# Patient Record
Sex: Male | Born: 1940 | Race: Black or African American | Hispanic: No | Marital: Married | Smoking: Former smoker
Health system: Southern US, Community
[De-identification: ages and names within clinical notes are randomized; demographics above are authoritative.]

## PROBLEM LIST (undated history)

## (undated) DIAGNOSIS — K219 Gastro-esophageal reflux disease without esophagitis: Secondary | ICD-10-CM

## (undated) DIAGNOSIS — H409 Unspecified glaucoma: Secondary | ICD-10-CM

## (undated) DIAGNOSIS — E785 Hyperlipidemia, unspecified: Secondary | ICD-10-CM

## (undated) DIAGNOSIS — I739 Peripheral vascular disease, unspecified: Secondary | ICD-10-CM

## (undated) DIAGNOSIS — A0472 Enterocolitis due to Clostridium difficile, not specified as recurrent: Secondary | ICD-10-CM

## (undated) DIAGNOSIS — I639 Cerebral infarction, unspecified: Secondary | ICD-10-CM

## (undated) DIAGNOSIS — I480 Paroxysmal atrial fibrillation: Secondary | ICD-10-CM

## (undated) DIAGNOSIS — I10 Essential (primary) hypertension: Secondary | ICD-10-CM

## (undated) DIAGNOSIS — N183 Chronic kidney disease, stage 3 unspecified: Secondary | ICD-10-CM

## (undated) DIAGNOSIS — G4730 Sleep apnea, unspecified: Secondary | ICD-10-CM

## (undated) DIAGNOSIS — D649 Anemia, unspecified: Secondary | ICD-10-CM

## (undated) DIAGNOSIS — E1122 Type 2 diabetes mellitus with diabetic chronic kidney disease: Secondary | ICD-10-CM

## (undated) DIAGNOSIS — I2510 Atherosclerotic heart disease of native coronary artery without angina pectoris: Secondary | ICD-10-CM

## (undated) DIAGNOSIS — N181 Chronic kidney disease, stage 1: Secondary | ICD-10-CM

## (undated) HISTORY — DX: Paroxysmal atrial fibrillation: I48.0

## (undated) HISTORY — DX: Morbid (severe) obesity due to excess calories: E66.01

## (undated) HISTORY — PX: NO PAST SURGERIES: SHX2092

## (undated) HISTORY — DX: Sleep apnea, unspecified: G47.30

## (undated) HISTORY — PX: THYROID SURGERY: SHX805

## (undated) HISTORY — DX: Gastro-esophageal reflux disease without esophagitis: K21.9

## (undated) HISTORY — PX: CORONARY ARTERY BYPASS GRAFT: SHX141

## (undated) HISTORY — PX: CHOLECYSTECTOMY: SHX55

## (undated) HISTORY — DX: Chronic kidney disease, stage 3 (moderate): N18.3

## (undated) HISTORY — DX: Chronic kidney disease, stage 3 unspecified: N18.30

## (undated) HISTORY — DX: Unspecified glaucoma: H40.9

## (undated) HISTORY — DX: Anemia, unspecified: D64.9

## (undated) HISTORY — DX: Hyperlipidemia, unspecified: E78.5

---

## 2001-02-11 ENCOUNTER — Emergency Department (HOSPITAL_COMMUNITY): Admission: EM | Admit: 2001-02-11 | Discharge: 2001-02-11 | Payer: Self-pay | Admitting: Internal Medicine

## 2001-02-20 ENCOUNTER — Encounter: Payer: Self-pay | Admitting: Internal Medicine

## 2001-02-20 ENCOUNTER — Ambulatory Visit (HOSPITAL_COMMUNITY): Admission: RE | Admit: 2001-02-20 | Discharge: 2001-02-20 | Payer: Self-pay | Admitting: Internal Medicine

## 2001-11-08 ENCOUNTER — Emergency Department (HOSPITAL_COMMUNITY): Admission: EM | Admit: 2001-11-08 | Discharge: 2001-11-08 | Payer: Self-pay | Admitting: *Deleted

## 2001-11-08 ENCOUNTER — Encounter: Payer: Self-pay | Admitting: *Deleted

## 2002-06-28 ENCOUNTER — Ambulatory Visit (HOSPITAL_COMMUNITY): Admission: RE | Admit: 2002-06-28 | Discharge: 2002-06-28 | Payer: Self-pay | Admitting: Internal Medicine

## 2004-01-27 ENCOUNTER — Inpatient Hospital Stay (HOSPITAL_COMMUNITY): Admission: EM | Admit: 2004-01-27 | Discharge: 2004-01-29 | Payer: Self-pay | Admitting: Emergency Medicine

## 2006-07-03 ENCOUNTER — Emergency Department (HOSPITAL_COMMUNITY): Admission: EM | Admit: 2006-07-03 | Discharge: 2006-07-03 | Payer: Self-pay | Admitting: Emergency Medicine

## 2006-12-01 ENCOUNTER — Emergency Department (HOSPITAL_COMMUNITY): Admission: EM | Admit: 2006-12-01 | Discharge: 2006-12-01 | Payer: Self-pay | Admitting: Emergency Medicine

## 2006-12-17 ENCOUNTER — Emergency Department (HOSPITAL_COMMUNITY): Admission: EM | Admit: 2006-12-17 | Discharge: 2006-12-18 | Payer: Self-pay | Admitting: Emergency Medicine

## 2007-01-07 ENCOUNTER — Emergency Department (HOSPITAL_COMMUNITY): Admission: EM | Admit: 2007-01-07 | Discharge: 2007-01-07 | Payer: Self-pay | Admitting: Emergency Medicine

## 2007-02-01 ENCOUNTER — Ambulatory Visit: Payer: Self-pay | Admitting: Cardiology

## 2007-02-01 ENCOUNTER — Inpatient Hospital Stay (HOSPITAL_COMMUNITY): Admission: EM | Admit: 2007-02-01 | Discharge: 2007-02-13 | Payer: Self-pay | Admitting: Emergency Medicine

## 2007-02-06 ENCOUNTER — Encounter (INDEPENDENT_AMBULATORY_CARE_PROVIDER_SITE_OTHER): Payer: Self-pay | Admitting: *Deleted

## 2007-02-07 ENCOUNTER — Ambulatory Visit: Payer: Self-pay | Admitting: Internal Medicine

## 2007-02-16 ENCOUNTER — Inpatient Hospital Stay (HOSPITAL_COMMUNITY): Admission: EM | Admit: 2007-02-16 | Discharge: 2007-02-20 | Payer: Self-pay | Admitting: Emergency Medicine

## 2007-02-17 ENCOUNTER — Ambulatory Visit: Payer: Self-pay | Admitting: Internal Medicine

## 2007-02-24 ENCOUNTER — Emergency Department (HOSPITAL_COMMUNITY): Admission: EM | Admit: 2007-02-24 | Discharge: 2007-02-25 | Payer: Self-pay | Admitting: Emergency Medicine

## 2007-02-25 ENCOUNTER — Inpatient Hospital Stay (HOSPITAL_COMMUNITY): Admission: EM | Admit: 2007-02-25 | Discharge: 2007-02-28 | Payer: Self-pay | Admitting: Emergency Medicine

## 2007-02-27 ENCOUNTER — Ambulatory Visit: Payer: Self-pay | Admitting: Internal Medicine

## 2007-02-27 ENCOUNTER — Encounter: Payer: Self-pay | Admitting: Internal Medicine

## 2008-11-23 ENCOUNTER — Inpatient Hospital Stay (HOSPITAL_COMMUNITY): Admission: AD | Admit: 2008-11-23 | Discharge: 2008-11-25 | Payer: Self-pay | Admitting: Internal Medicine

## 2008-11-23 ENCOUNTER — Encounter: Payer: Self-pay | Admitting: Emergency Medicine

## 2008-11-25 ENCOUNTER — Encounter (INDEPENDENT_AMBULATORY_CARE_PROVIDER_SITE_OTHER): Payer: Self-pay | Admitting: *Deleted

## 2009-05-23 ENCOUNTER — Emergency Department (HOSPITAL_COMMUNITY): Admission: EM | Admit: 2009-05-23 | Discharge: 2009-05-23 | Payer: Self-pay | Admitting: Emergency Medicine

## 2010-04-29 ENCOUNTER — Emergency Department (HOSPITAL_COMMUNITY): Admission: EM | Admit: 2010-04-29 | Discharge: 2010-04-29 | Payer: Self-pay | Admitting: Emergency Medicine

## 2010-06-03 ENCOUNTER — Emergency Department (HOSPITAL_COMMUNITY): Admission: EM | Admit: 2010-06-03 | Discharge: 2010-06-03 | Payer: Self-pay | Admitting: Emergency Medicine

## 2010-11-22 ENCOUNTER — Encounter: Payer: Self-pay | Admitting: Internal Medicine

## 2011-01-15 LAB — CBC
HCT: 44 % (ref 39.0–52.0)
Hemoglobin: 14.3 g/dL (ref 13.0–17.0)
MCH: 29.5 pg (ref 26.0–34.0)
MCHC: 32.5 g/dL (ref 30.0–36.0)
MCV: 90.7 fL (ref 78.0–100.0)
Platelets: 225 10*3/uL (ref 150–400)
RBC: 4.86 MIL/uL (ref 4.22–5.81)
RDW: 14.6 % (ref 11.5–15.5)
WBC: 8.4 10*3/uL (ref 4.0–10.5)

## 2011-01-15 LAB — BASIC METABOLIC PANEL
BUN: 17 mg/dL (ref 6–23)
CO2: 29 mEq/L (ref 19–32)
Calcium: 9.5 mg/dL (ref 8.4–10.5)
Chloride: 102 mEq/L (ref 96–112)
Creatinine, Ser: 1.25 mg/dL (ref 0.4–1.5)
GFR calc Af Amer: >60 mL/min (ref >60)
GFR calc non Af Amer: 58 mL/min — ABNORMAL LOW (ref >60)
Glucose, Bld: 59 mg/dL — ABNORMAL LOW (ref 70–99)
Potassium: 4.2 mEq/L (ref 3.5–5.1)
Sodium: 139 mEq/L (ref 135–145)

## 2011-01-15 LAB — URINALYSIS, ROUTINE W REFLEX MICROSCOPIC
Bilirubin Urine: NEGATIVE
Glucose, UA: NEGATIVE mg/dL
Ketones, ur: NEGATIVE mg/dL
Leukocytes, UA: NEGATIVE
Nitrite: NEGATIVE
Protein, ur: NEGATIVE mg/dL
Specific Gravity, Urine: 1.015 (ref 1.005–1.030)
Urobilinogen, UA: 0.2 mg/dL (ref 0.0–1.0)
pH: 5.5 (ref 5.0–8.0)

## 2011-01-15 LAB — HEPATIC FUNCTION PANEL
ALT: 21 U/L (ref 0–53)
AST: 18 U/L (ref 0–37)
Albumin: 3.9 g/dL (ref 3.5–5.2)
Alkaline Phosphatase: 43 U/L (ref 39–117)
Bilirubin, Direct: 0.1 mg/dL (ref 0.0–0.3)
Indirect Bilirubin: 0.6 mg/dL (ref 0.3–0.9)
Total Bilirubin: 0.7 mg/dL (ref 0.3–1.2)
Total Protein: 8 g/dL (ref 6.0–8.3)

## 2011-01-15 LAB — DIFFERENTIAL
Basophils Absolute: 0 10*3/uL (ref 0.0–0.1)
Basophils Relative: 0 % (ref 0–1)
Eosinophils Absolute: 0.1 10*3/uL (ref 0.0–0.7)
Eosinophils Relative: 2 % (ref 0–5)
Lymphocytes Relative: 25 % (ref 12–46)
Lymphs Abs: 2.1 10*3/uL (ref 0.7–4.0)
Monocytes Absolute: 0.8 10*3/uL (ref 0.1–1.0)
Monocytes Relative: 10 % (ref 3–12)
Neutro Abs: 5.2 10*3/uL (ref 1.7–7.7)
Neutrophils Relative %: 63 % (ref 43–77)

## 2011-01-15 LAB — GLUCOSE, CAPILLARY
Glucose-Capillary: 115 mg/dL — ABNORMAL HIGH (ref 70–99)
Glucose-Capillary: 56 mg/dL — ABNORMAL LOW (ref 70–99)

## 2011-01-15 LAB — LIPASE, BLOOD: Lipase: 31 U/L (ref 11–59)

## 2011-01-15 LAB — URINE MICROSCOPIC-ADD ON: Urine-Other: NONE SEEN

## 2011-01-17 LAB — DIFFERENTIAL
Basophils Absolute: 0 10*3/uL (ref 0.0–0.1)
Basophils Relative: 1 % (ref 0–1)
Eosinophils Absolute: 0.2 10*3/uL (ref 0.0–0.7)
Eosinophils Relative: 2 % (ref 0–5)
Lymphocytes Relative: 29 % (ref 12–46)
Lymphs Abs: 2.2 10*3/uL (ref 0.7–4.0)
Monocytes Absolute: 0.8 10*3/uL (ref 0.1–1.0)
Monocytes Relative: 10 % (ref 3–12)
Neutro Abs: 4.4 10*3/uL (ref 1.7–7.7)
Neutrophils Relative %: 58 % (ref 43–77)

## 2011-01-17 LAB — BASIC METABOLIC PANEL
BUN: 16 mg/dL (ref 6–23)
CO2: 27 mEq/L (ref 19–32)
Calcium: 9 mg/dL (ref 8.4–10.5)
Chloride: 108 mEq/L (ref 96–112)
Creatinine, Ser: 1.15 mg/dL (ref 0.4–1.5)
GFR calc Af Amer: >60 mL/min (ref >60)
GFR calc non Af Amer: >60 mL/min (ref >60)
Glucose, Bld: 71 mg/dL (ref 70–99)
Potassium: 3.9 mEq/L (ref 3.5–5.1)
Sodium: 142 mEq/L (ref 135–145)

## 2011-01-17 LAB — CBC
HCT: 41.5 % (ref 39.0–52.0)
Hemoglobin: 13.5 g/dL (ref 13.0–17.0)
MCH: 29.4 pg (ref 26.0–34.0)
MCHC: 32.5 g/dL (ref 30.0–36.0)
MCV: 90.4 fL (ref 78.0–100.0)
Platelets: 235 10*3/uL (ref 150–400)
RBC: 4.58 MIL/uL (ref 4.22–5.81)
RDW: 14.3 % (ref 11.5–15.5)
WBC: 7.5 10*3/uL (ref 4.0–10.5)

## 2011-02-07 LAB — GLUCOSE, CAPILLARY: Glucose-Capillary: 143 mg/dL — ABNORMAL HIGH (ref 70–99)

## 2011-02-15 LAB — CBC
HCT: 42.4 % (ref 39.0–52.0)
HCT: 41.3 % (ref 39.0–52.0)
Hemoglobin: 13.8 g/dL (ref 13.0–17.0)
Hemoglobin: 13.3 g/dL (ref 13.0–17.0)
MCHC: 32.6 g/dL (ref 30.0–36.0)
MCHC: 32.1 g/dL (ref 30.0–36.0)
MCV: 90.3 fL (ref 78.0–100.0)
MCV: 91.4 fL (ref 78.0–100.0)
Platelets: 230 10*3/uL (ref 150–400)
Platelets: 213 10*3/uL (ref 150–400)
RBC: 4.7 MIL/uL (ref 4.22–5.81)
RBC: 4.52 MIL/uL (ref 4.22–5.81)
RDW: 14.2 % (ref 11.5–15.5)
RDW: 13.8 % (ref 11.5–15.5)
WBC: 8.1 10*3/uL (ref 4.0–10.5)
WBC: 6.5 10*3/uL (ref 4.0–10.5)

## 2011-02-15 LAB — GLUCOSE, CAPILLARY
Glucose-Capillary: 161 mg/dL — ABNORMAL HIGH (ref 70–99)
Glucose-Capillary: 163 mg/dL — ABNORMAL HIGH (ref 70–99)
Glucose-Capillary: 194 mg/dL — ABNORMAL HIGH (ref 70–99)
Glucose-Capillary: 207 mg/dL — ABNORMAL HIGH (ref 70–99)
Glucose-Capillary: 211 mg/dL — ABNORMAL HIGH (ref 70–99)
Glucose-Capillary: 218 mg/dL — ABNORMAL HIGH (ref 70–99)
Glucose-Capillary: 221 mg/dL — ABNORMAL HIGH (ref 70–99)
Glucose-Capillary: 264 mg/dL — ABNORMAL HIGH (ref 70–99)

## 2011-02-15 LAB — COMPREHENSIVE METABOLIC PANEL
ALT: 25 U/L (ref 0–53)
ALT: 24 U/L (ref 0–53)
AST: 27 U/L (ref 0–37)
AST: 21 U/L (ref 0–37)
Albumin: 3.4 g/dL — ABNORMAL LOW (ref 3.5–5.2)
Albumin: 3.1 g/dL — ABNORMAL LOW (ref 3.5–5.2)
Alkaline Phosphatase: 41 U/L (ref 39–117)
Alkaline Phosphatase: 40 U/L (ref 39–117)
BUN: 13 mg/dL (ref 6–23)
BUN: 12 mg/dL (ref 6–23)
CO2: 27 mEq/L (ref 19–32)
CO2: 30 mEq/L (ref 19–32)
Calcium: 9.1 mg/dL (ref 8.4–10.5)
Calcium: 8.7 mg/dL (ref 8.4–10.5)
Chloride: 100 mEq/L (ref 96–112)
Chloride: 104 mEq/L (ref 96–112)
Creatinine, Ser: 1.29 mg/dL (ref 0.4–1.5)
Creatinine, Ser: 1.08 mg/dL (ref 0.4–1.5)
GFR calc Af Amer: >60 mL/min (ref >60)
GFR calc Af Amer: >60 mL/min (ref >60)
GFR calc non Af Amer: 56 mL/min — ABNORMAL LOW (ref >60)
GFR calc non Af Amer: >60 mL/min (ref >60)
Glucose, Bld: 196 mg/dL — ABNORMAL HIGH (ref 70–99)
Glucose, Bld: 242 mg/dL — ABNORMAL HIGH (ref 70–99)
Potassium: 4.1 mEq/L (ref 3.5–5.1)
Potassium: 4 mEq/L (ref 3.5–5.1)
Sodium: 136 mEq/L (ref 135–145)
Sodium: 138 mEq/L (ref 135–145)
Total Bilirubin: 0.5 mg/dL (ref 0.3–1.2)
Total Bilirubin: 0.5 mg/dL (ref 0.3–1.2)
Total Protein: 6.7 g/dL (ref 6.0–8.3)
Total Protein: 6.5 g/dL (ref 6.0–8.3)

## 2011-02-15 LAB — PROTIME-INR
INR: 1 (ref 0.00–1.49)
INR: 1.1 (ref 0.00–1.49)
Prothrombin Time: 13 seconds (ref 11.6–15.2)
Prothrombin Time: 14.2 seconds (ref 11.6–15.2)

## 2011-02-15 LAB — DIFFERENTIAL
Basophils Absolute: 0.1 10*3/uL (ref 0.0–0.1)
Basophils Relative: 1 % (ref 0–1)
Eosinophils Absolute: 0.2 10*3/uL (ref 0.0–0.7)
Eosinophils Relative: 2 % (ref 0–5)
Lymphocytes Relative: 30 % (ref 12–46)
Lymphs Abs: 2.4 10*3/uL (ref 0.7–4.0)
Monocytes Absolute: 0.6 10*3/uL (ref 0.1–1.0)
Monocytes Relative: 8 % (ref 3–12)
Neutro Abs: 4.8 10*3/uL (ref 1.7–7.7)
Neutrophils Relative %: 59 % (ref 43–77)

## 2011-02-15 LAB — URINALYSIS, ROUTINE W REFLEX MICROSCOPIC
Bilirubin Urine: NEGATIVE
Glucose, UA: NEGATIVE mg/dL
Hgb urine dipstick: NEGATIVE
Ketones, ur: NEGATIVE mg/dL
Nitrite: NEGATIVE
Protein, ur: NEGATIVE mg/dL
Specific Gravity, Urine: 1.012 (ref 1.005–1.030)
Urobilinogen, UA: 0.2 mg/dL (ref 0.0–1.0)
pH: 6 (ref 5.0–8.0)

## 2011-02-15 LAB — APTT
aPTT: 26 seconds (ref 24–37)
aPTT: 33 seconds (ref 24–37)

## 2011-02-15 LAB — CARDIAC PANEL(CRET KIN+CKTOT+MB+TROPI)
CK, MB: 3.6 ng/mL (ref 0.3–4.0)
CK, MB: 4.1 ng/mL — ABNORMAL HIGH (ref 0.3–4.0)
CK, MB: 4.1 ng/mL — ABNORMAL HIGH (ref 0.3–4.0)
Relative Index: 0.5 (ref 0.0–2.5)
Relative Index: 0.5 (ref 0.0–2.5)
Relative Index: 0.6 (ref 0.0–2.5)
Total CK: 715 U/L — ABNORMAL HIGH (ref 7–232)
Total CK: 742 U/L — ABNORMAL HIGH (ref 7–232)
Total CK: 784 U/L — ABNORMAL HIGH (ref 7–232)
Troponin I: 0.01 ng/mL (ref 0.00–0.06)
Troponin I: 0.01 ng/mL (ref 0.00–0.06)
Troponin I: 0.01 ng/mL (ref 0.00–0.06)

## 2011-02-15 LAB — HEMOGLOBIN A1C
Hgb A1c MFr Bld: 8.3 % — ABNORMAL HIGH (ref 4.6–6.1)
Mean Plasma Glucose: 192 mg/dL

## 2011-02-15 LAB — HOMOCYSTEINE: Homocysteine: 9.1 umol/L (ref 4.0–15.4)

## 2011-02-15 LAB — TSH: TSH: 0.727 u[IU]/mL (ref 0.350–4.500)

## 2011-03-16 NOTE — Discharge Summary (Signed)
[name], FERRARE NO.:  [id number]   MEDICAL RECORD NO.:  [id number]          PATIENT TYPE:  INP   LOCATION:  3022                         FACILITY:  [facility]   PHYSICIAN:  Neysa Bonito, MD  DATE OF BIRTH:  1941-05-30   DATE OF ADMISSION:  11/23/2008  DATE OF DISCHARGE:  11/25/2008                               DISCHARGE SUMMARY   PRIMARY CARE PHYSICIAN:  [name] is unassigned to [facility].  He  follows up with [name]:  Facial droop and left-sided numbness.   HOSPITAL COURSE:  During the hospital stay, the patient was observed for  neurological deficit.  There was a suspicion of transient ischemic  attack versus CVA.  The workup is all negative as below.  The patient  will be released today with some change in the medication which includes  increasing the dose of aspirin and increasing the dose of losartan the  anti-hypertension medication.  The patient is stable for discharge and  he was encouraged to follow up with his primary [place] physician within 1  week.  His lipid profile was not done during this hospital stay and the  patient is not willing to wait for the test.  This test can be done as  outpatient and the patient is on a maximum dose of Zocor.   HOSPITAL PROCEDURES:  1. CT head without contrast on November 23, 2008, the impression was      old infarcts, mild chronic small vessel white matter ischemic      change, and mild diffuse cerebral and cerebellar atrophy.  No acute      abnormalities.  2. MRI and MRA done on November 25, 2007, the result is showing atrophy      and small vessel disease.  No visible acute infarct or acute      intracranial hemorrhage.  The MRA impression is reading      unremarkable MR angiography of the intracranial circulation.  3. 2-D echo done on November 25, 2008, the impression was showing      overall left ventricular systolic function was normal.  Left      ventricular ejection  fraction was estimated range between 55% to      60%.  This study was inadequate for evaluation of left ventricular      regional wall motion.  Left ventricle wall thickness was moderately      increased.  There was dyssynergic motion of the interventricular      septum/septal bounce consistent with intraventricular conduction      delay or postop CABG change.  Aortic valve thickness was mildly      increased.  No cardioembolic source identified.   LABORATORY TEST:  His homocysteine is 9.1 within normal range.  Cardiac  panel is essentially negative and his hemoglobin A1c is 8.3.  His TSH is  0.72.  Sodium is 138, potassium 4.0, chloride is 104, bicarb is 30,  glucose is 242, BUN is 12, and creatinine 1.08.  CBC showing white blood  count is 6.5, hemoglobin is 13.3,  hematocrit is 41.3, and platelet count  is 213.  Carotid duplex is essentially negative with antegrade vertebral  artery circulation.   DISCHARGE DIAGNOSES:  1. Slurred speech felt secondary to transient ischemic attack.  No      evidence of acute stroke at this time.  2. History of cerebrovascular accident.  3. Coronary artery disease.  4. Hypertension.  5. Type 2 diabetes.   DISCHARGE MEDICATIONS:  1. Lasix 40 mg daily.  2. Zocor 80 mg at bedtime.  3. Aspirin 325 mg daily.  4. Potassium chloride 20 mEq daily.  5. Lantus 95 units subcutaneously at bedtime.  6. Metformin 1000 mg twice a day.  7. Metoprolol 25 mg p.o. b.i.d.  8. Omeprazole 20 mg daily.  9. Sliding scale insulin.  10.Losartan 100 mg p.o. daily.  11.Norvasc 5 mg p.o. daily.   DISCHARGE PLAN:  The patient is discharged today to follow up with his  primary physician.  It is important to be cognizant of the better  control of his hypertension, his diabetes as well as his cholesterol.  He is aware and agreeable to discharge plan.  Prescription was given.      Neysa Bonito, MD  Electronically Signed     EME/MEDQ  D:  11/25/2008  T:   11/26/2008  Job:  [id number]

## 2011-03-16 NOTE — H&P (Signed)
[name], [name] NO.:  [id number]   MEDICAL RECORD NO.:  [id number]          PATIENT TYPE:  INP   LOCATION:  3022                         FACILITY:  [facility]   PHYSICIAN:  Jana Hakim, M.D. DATE OF BIRTH:  11-Mar-1941   DATE OF ADMISSION:  11/23/2008  DATE OF DISCHARGE:                              HISTORY & PHYSICAL   PRIMARY CARE PHYSICIAN:  Unassigned.  Dr. Ivin Booty of [place] in [place]:  Facial droop, left-sided numbness.   HISTORY OF PRESENT ILLNESS:  This is a 70 year old male who presents  with multiple medical problems and 2 previous CVAs  He was taken to the  emergency department at [facility] by his wife, after she  noticed that he had mild facial drooping and had complaints of left-  sided weakness.  The patient states that he has had a headache for  several days, and at 2:00 p.m. on January 23 he states that he had  increased left-sided weakness and his wife noticed the facial droop.  The patient states that he had 2 previous CVAs, one in 2002 and the next  in 2008, which affected his left side and his speech.  The patient  denies having any chest pain, shortness of breath, nausea, vomiting,  diarrhea, loss of consciousness, dizziness or blurry vision.   PAST MEDICAL HISTORY:  1. Type 2 diabetes mellitus.  2. Coronary artery disease.  3. Hypertension.  4. Hyperlipidemia.  5. Cerebrovascular accidents x2.   PAST SURGICAL HISTORY:  1. 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  2. Cholecystectomy.  3. Thyroid cyst removal.   MEDICATIONS AT THIS TIME:  Metformin, simvastatin, metoprolol, Lantus  insulin, NovoLog sliding scale insulin, aspirin, lisinopril and  omeprazole.   ALLERGIES:  No known drug allergies.   SOCIAL HISTORY:  The patient is married.  He is a nonsmoker and  nondrinker.   FAMILY HISTORY:  Noncontributory.   PHYSICAL EXAMINATION FINDINGS:  This is a 70 year old  overweight, well-  developed male in no discomfort or acute distress currently.  VITAL SIGNS: Temperature 98.0, blood pressure 162/81, heart rate 81,  respirations 20, respirations 97-100%.  HEENT: Examination normocephalic, atraumatic.  There is a right-sided  facial droop.  Pupils are equally round and reactive to light.  Extraocular movements are intact.  Funduscopic benign.  Oropharynx is  clear.  There is no tongue deviation.  NECK:  Supple, full range of motion.  No thyromegaly, adenopathy or  jugular venous distention.  CARDIOVASCULAR:  Regular rate and rhythm.  No murmurs, gallops or rubs.  LUNGS: Clear to auscultation bilaterally.  ABDOMEN: Positive bowel sounds; soft, nontender and nondistended.  EXTREMITIES: Without cyanosis, clubbing or edema.  NEUROLOGIC EXAMINATION:  The patient is alert and oriented x3.  He has  mild dysarthria.  He has right-sided facial drooping and mild tongue  deviation to the left.  There is left hemiparesis.  However, the  strength of the left upper extremity is about a 4 out of 5.  There is no  pronator drifting.  The  patient is able to move both of his lower  extremities and has full range of motion.   LABORATORY STUDIES:  White blood cell count 8.1, hemoglobin 13.8,  hematocrit 42.4, MCV 90.3, platelets 230, neutrophils 59%, lymphocytes  30%.  Sodium 136, potassium 4.1, chloride 100, carbon dioxide 27, BUN  13, creatinine 1.29, glucose 196.  Albumin 3.4, AST 27, ALT 25.  Pro  time 13.0, INR 1.0 and PTT is 26.  CT scan of the head reveals old  infarcts, mild chronic small vessel white matter ischemic changes, and  mild diffuse cerebral and cerebellar atrophy; no acute intracranial  hemorrhage seen.   ASSESSMENT:  A 70 year old male being admitted with:  1. Cerebrovascular accident versus transient ischemic attack.  2. Coronary artery disease.  3. Hypertension.  4. Type 2 diabetes mellitus.  5. Previous cerebrovascular accidents.   PLAN:   The patient will be admitted to a telemetry area for monitoring.  Cardiac enzymes will be performed and a cerebrovascular workup will be  started.  An MRI/MRA study has been ordered, along with a carotid  ultrasound study; and a 2-D echo will also be considered.  The patient's  regular medications will be continued and sliding scale insulin coverage  will also be ordered, with glucose checks q.4 h.  The patient will be  placed on DVT and GI prophylaxis, and his regular medications will be  continued.  Further workup will ensue, pending results of his studies  and his clinical course.      Jana Hakim, M.D.  Electronically Signed     HJ/MEDQ  D:  11/24/2008  T:  11/24/2008  Job:  [id number]

## 2011-03-19 NOTE — Consult Note (Signed)
NAMEADDISON, SCHARTNER NO.:  [id number]   MEDICAL RECORD NO.:  [id number]          PATIENT TYPE:  INP   LOCATION:  A316                          FACILITY:  [facility]   PHYSICIAN:  Cristopher Estimable. Lattie Haw, MD, FACCDATE OF BIRTH:  [birth year]   DATE OF CONSULTATION:  DATE OF DISCHARGE:                                 CONSULTATION   REFERRING PHYSICIAN:  Dr. Romona Curls.   PRIMARY CARE:  [place]   PRIMARY CARDIOLOGY:  [place].   HISTORY OF PRESENT ILLNESS:  A 70 year old gentleman admitted with acute  cholecystitis, now referred for preoperative assessment.  Mr. Kenderdine  cardiac history dates back approximately 1 year, when he was admitted  with his first episode of acute cholecystitis.  Cardiac workup was  undertaken for unclear reasons and apparently revealed severe coronary  disease with normal LV systolic function, prompting CABG surgery at the  [facility].  His postoperative course was complicated  by a C. Difficile infection that eventually resolved completely.  He has  done well since, with essentially no symptoms.  He reports vague  momentary episodes of dull chest and epigastric discomfort,  intermittently.  It is unrelated to exertion, is non-radiating and has  no associated symptoms.  He has diabetes that has been generally under  good control, with a single oral agent, plus insulin.  He has lapsed, in  terms of his diet recently, resulting in some CBGs greater than 200.  He  has not used tobacco products for years.  He does not believe that he  has hypertension, but is being treated with anti-hypertensive  medication.  His lipid status is unknown on treatment.   RECENT MEDICATIONS:  Include:  1. Omeprazole 20 mg daily.  2. Clonazepam 0.25 mg p.r.n.  3. Lisinopril 5 mg daily.  4. Metformin 1000 mg b.i.d.  5. Aspirin 325 mg daily.  6. Simvastatin 40 mg daily.  7. Metoprolol 25 mg b.i.d.  8. Citalopram 20 mg daily.  9. Insulin N 15  units q.a.m. and 30 units q.p.m.   The patient reports no drug allergies.   PAST MEDICAL HISTORY:  Is otherwise unremarkable.  Other than his  cardiac surgery, he underwent excision of a cyst from his thyroid.  He  has had no abdominal procedures.  There is a history of depression and  GERD.   SOCIAL HISTORY:  Married; denies the use of tobacco products or alcohol.   REVIEW OF SYSTEMS:  Notable for a CVA in 2000.  He has a problem with  chronic constipation.  He requires corrective lenses for near and far  vision.  All other systems reviewed and are negative.   FAMILY HISTORY:  Not above, mother and father developed coronary disease  at advanced age.   EXAM:  GENERAL:  Pleasant, well-appearing, somewhat overweight gentleman  in no acute distress.  VITAL SIGNS:  The temperature is 98.9, blood pressure 150/75, heart rate  86 and regular, respirations 20, CBG 316.  HEENT:  Anicteric sclerae; pupils equal, round, reactive to light;  normal oral mucosa.  NECK:  No jugular  venous distension; normal carotid upstrokes without  bruits.  ENDOCRINE:  No thyromegaly.  HEMATOPOIETIC:  No adenopathy.  SKIN:  Keloid formation at the surgical sites in the thorax and chest  and leg.  LUNGS:  Clear.  CARDIAC:  Normal first and second heart sounds; minimal systolic murmur.  ABDOMEN:  Soft and nontender; liver edge palpable 2-3 cm below the right  costal margin; normal bowel sounds without bruits; no masses.  EXTREMITIES:  Decreased distal pulses; no edema.  NEUROMUSCULAR:  Symmetric strength and tone; normal cranial nerves.  MUSCULOSKELETAL:  No joint deformities.   EKG and chest x-ray are pending.   LABORATORY:  Notable for normal white count, glucose around 300, normal  renal function and elevated LFTs.   IMPRESSION:  Mr. Gruba is doing well from a cardiac standpoint,  approximately 1 year following CABG surgery.  With normal LV systolic  function preoperatively and postoperatively.  His  cardiovascular risk  for non-cardiac surgery is low.  No additional preoperative testing is  necessary.  His dose of metoprolol will be increased to 50 mg b.i.d.   Diabetic control appears sub-optimal, but may have been worsened due to  stress and other issues with acute hospitalization.  A hemoglobin A1c  level will be checked.  Lipids will be checked.  I will be happy to  follow this nice gentleman with you.      Cristopher Estimable. Lattie Haw, MD, [facility]  Electronically Signed     RMR/MEDQ  D:  02/03/2007  T:  02/03/2007  Job:  [id number]

## 2011-03-19 NOTE — Group Therapy Note (Signed)
NAMEKIANSH, ANDRZEJEWSKI NO.:  [id number]   MEDICAL RECORD NO.:  [id number]          PATIENT TYPE:  INP   LOCATION:  A210                          FACILITY:  [facility]   PHYSICIAN:  Audria Nine, M.D.DATE OF BIRTH:  Jun 10, 1941   DATE OF PROCEDURE:  02/18/2007  DATE OF DISCHARGE:                                 PROGRESS NOTE   SUBJECTIVE:  The patient says his left facial numbness and left arm  weakness is better.  He denies any fevers or chills, no headaches.  Physical therapy thinks that he is doing better and may only require  home PT.  His diarrhea is also better.  The patient has a history of C.  diff colitis.   OBJECTIVE:  GENERAL:  Conscious, alert, comfortable, pleasant, not in  acute distress.  VITAL SIGNS:  Blood pressure 140/61, pulse of 73, respirations 20,  temperature 98, blood sugar are ranging between 186 to 309, oxygen  saturation was 95% on room air.  HEENT:  Normocephalic atraumatic.  Oral mucosa was moist with no  exudate.  NECK:  Supple.  No JVD.  No lymphadenopathy.  LUNGS:  Clear clinically.  Good air entry bilaterally.  HEART:  S1 S2 regular.  No S3, S4, gallops, or rubs.  ABDOMEN:  Soft, nontender.  Bowel sounds positive.  EXTREMITIES:  No edema.  CNS:  Conscious, alert, well oriented in time, place, and person.  He  does have very minimal weakness on the left side with a positive  pronator drift.  Power is 4/5 in both upper and lower extremities with  no decreased sensation.  The patient, however, reports decreased  sensation on the face, on the left side.  No cerebellar signs were  noted.   LABORATORY/DIAGNOSTIC DATA:  White blood cell count was 8.3, hemoglobin  of 12, hem 35.8, platelet count 417 with no left shift.  Sodium was 135,  potassium 4.2, chloride of 100, CO2 was 28, glucose 254, BUN of 11,  creatinine 1.  AST of 14, ALT 16, albumin 2.6.   ASSESSMENT/PLAN:  Mr. Dittmar is a 70 year old African American male who  was  admitted with an acute cerebrovascular accident involving his left  side.  Head CT scan actually confirmed evidence of acute ischemic  stroke.  The patient had been in the hospital for the past two weeks,  was admitted for a gallbladder disease and had a laparoscopic  cholecystectomy.  His aspirin has been on hold since his surgery.  And,  the patient was only restarted on it yesterday.  The patient clearly  does have risk factors for stroke and myocardial infarction based on his  prior history of high cholesterol, diabetes, and hypertension.  The  stroke appears to be very mild.  I discussed with physical therapy and  it appears that the patient will be able to go home on home physical  therapy.  We will observe him over the weekend and anticipate discharge  in the next 2-3 days.  Continue all other medications at this time.     Audria Nine, M.D.  Electronically Signed  AM/MEDQ  D:  02/18/2007  T:  02/18/2007  Job:  [id number]

## 2011-03-19 NOTE — Group Therapy Note (Signed)
NAME:  [name], [name] NO.:  [id number]   MEDICAL RECORD NO.:  [id number]          PATIENT TYPE:  INP   LOCATION:  A326                          FACILITY:  [facility]   PHYSICIAN:  Audria Nine, M.D.DATE OF BIRTH:  1941-02-12   DATE OF PROCEDURE:  02/12/2007  DATE OF DISCHARGE:                                 PROGRESS NOTE   SUBJECTIVE:  The patient is doing better than yesterday.  He has very  minimal pain at his surgical site.  He still continues to have diarrhea.  He had two episodes this morning and three episodes overnight.  He  denies any cramping.  No fevers or chills.  He has C. difficile colitis.  He is on treatment.  The patient does have a history of severe C.  difficile colitis after his bypass surgery in the [place].  He is currently  on metronidazole.   OBJECTIVE:  GENERAL:  Conscious, alert, comfortable, not in acute  distress.  VITAL SIGNS:  Stable at this time.  Blood sugars have been ranging  between 176-420.  HEENT:  Normocephalic and atraumatic.  Oral mucosa was moist, no  exudates.  NECK:  Supple, no JVD.  LUNGS:  Clear.  HEART: S1, S2 regular.  ABDOMEN: Soft, nontender.  Laparotomy scar with staples noted intact.   LABORATORY DATA:  Stable at this time with no evidence of leukocytosis.   ASSESSMENT AND PLAN:  1. Status post cholecystectomy.  The patient is doing fairly well with      no fevers or abdominal pain.  The patient was seen by surgery who      was anticipating discharge from a surgical point of view soon.  2. Clostridium difficile diarrhea.  The patient is currently on      metronidazole.  He is still having some loose bowel movements with      associated lightheadedness.  I think it is reasonable to continue      to watch him closely as he does have a prior history of severe      Clostridium difficile colitis only in 2007.   DISPOSITION:  Anticipate discharge in the next 1-2 days.  Continue  treatment for Clostridium difficile.   Hopefully his diarrheal episodes  will decrease and if approved by surgery, patient may be discharged  home.      Audria Nine, M.D.  Electronically Signed     AM/MEDQ  D:  02/12/2007  T:  02/12/2007  Job:  [id number]

## 2011-03-19 NOTE — Group Therapy Note (Signed)
NAME:  AADHI, SMYSER NO.:  [id number]   MEDICAL RECORD NO.:  [id number]          PATIENT TYPE:  INP   LOCATION:  A326                          FACILITY:  [facility]   PHYSICIAN:  Audria Nine, M.D.DATE OF BIRTH:  07-11-1941   DATE OF PROCEDURE:  02/11/2007  DATE OF DISCHARGE:                                 PROGRESS NOTE   SUBJECTIVE:  Patient is still recuperating from surgery.  He has no  significant pain.  He has developed some diarrhea, has had 3 episodes  today, mostly loose.  He denies any cramping or fevers.  His stool  cultures have come back C. difficile positive.   Patient was started on metronidazole early this morning.   OBJECTIVE:  Conscious, alert, comfortable, not in acute distress.  VITAL SIGNS:  Blood pressure was stable.  HEENT EXAM:  Normocephalic and atraumatic.  Oral mucosa was moist.  NECK:  Supple.  No JVD.  LUNGS:  Were clear.  Clinically, good air entry bilaterally.  HEART:  S1 and S2 regular.  No S3, gallops, or rubs.  ABDOMEN:  Laparotomy scar on the right upper quadrant with staples  intact with no tenderness noted.  JP drain mostly serosanguineous fluid  less than cc was seen.  EXTREMITIES:  No edema.   LABORATORY DATA:  White blood cells count is 8.5, hemoglobin 11.9,  hematocrit 35.8, platelet count was 345,000.  Cultures of liver abscess  are negative.  Stools for C. difficile was positive.   ASSESSMENT AND PLAN:  1. Status post cholecystectomy.  Patient is doing fairly well with no      fevers, no abdominal pain.  His diet is also much better.  We will      continue current treatment as per Surgery.  2. Clostridium difficile diarrhea.  Patient told me he has had a      history of this in the past when he was at [place] after he had a bypass      surgery in 2007.  He said it was fairly severe.  Patient was      started on Flagyl already.  We will continue to monitor closely for      any exacerbation or worsening of the C.  difficile diarrhea.  He is      otherwise stable at this time.  3. Elevated transaminases.  This is stable, likely due to liver      abscess.  Cultures from the liver abscess are negative thus far.  4. Diabetes mellitus.  Blood sugars are still fluctuating.  We will      continue sliding scale and oral hyperglycemic      agents.  Today is day 5 postop.  5. Coronary artery disease.  Stable, with no chest pain.   DISPOSITION:  I anticipate discharge home in the next 1 to 2 days.      Audria Nine, M.D.  Electronically Signed     AM/MEDQ  D:  02/11/2007  T:  02/11/2007  Job:  [id number]

## 2011-03-19 NOTE — Consult Note (Signed)
NAMEMARVIS, [name] NO.:  [id number]   MEDICAL RECORD NO.:  [id number]          PATIENT TYPE:  INP   LOCATION:  A210                          FACILITY:  [facility]   PHYSICIAN:  Kofi A. Merlene Laughter, M.D. DATE OF BIRTH:  08-Dec-1940   DATE OF CONSULTATION:  DATE OF DISCHARGE:                                 CONSULTATION   Patient is a 70 year old black male who underwent cholecystectomy about  a week or so ago for right upper quadrant pain.  He apparently had a  relatively uneventful course, although he does have diabetes.  He was  maintained on a sliding scale.  He did have some problems with  drowsiness and walking for discharge.  It appears that this got worse  for the first few days after discharge.  He subsequently developed left  facial numbness and even more difficulty ambulating which resulted in  the patient coming to the hospital.  He complains of dizziness.  The  patient had been on aspirin preoperatively, and it appears this was held  and restarted a few days ago.  It appears that the patient did have  Clostridium difficile since that hospital admission.   PAST MEDICAL HISTORY:  1. Hypertension.  2. Diabetes.  3. Coronary artery disease.  4. GERD.  5. Depression.  6. Dyslipidemia.   PAST SURGICAL HISTORY:  1. Status post cystectomy from the thyroid gland.  2. Status post four vessel coronary artery bypass grafting.  3. Status post cholecystectomy for cholelithiasis.   CURRENT MEDICATIONS:  Aspirin, Colace, Lovenox, insulin, lisinopril,  Metformin, metoprolol, Flagyl, Protonix, simvastatin, Phenergan.   SOCIAL HISTORY:  The patient has a supportive family.  No history of  alcohol or tobacco use.  No history of illicit drug use.   ALLERGIES:  None known.   PHYSICAL EXAMINATION:  GENERAL:  A pleasant, mildly overweight gentleman  in no acute distress.  VITAL SIGNS:  Temperature 97.2, pulse 73, respirations 20, blood  pressure 124/63.  HEENT:  Head  is normocephalic and atraumatic.  NECK:  Supple.  ABDOMEN:  Soft.  EXTREMITIES:  Left leg is status post venous grafting with some distal  hyperpigmentation noted there.  Trace ankle edema bilaterally.  MENTATION:  Patient is awake and alert.  He converses well.  He is lucid  and coherent.  No dysarthria noted.  No aphasia is noted.  Cranial nerve  evaluation shows extraocular movements are full without nystagmus.  Visual fields are intact.  Facial muscle strength is symmetric.  Tongue  is midline.  Uvula is midline.  Sensation is significantly diminished to  temperature, light touch on the left side of the upper and lower areas.  Shoulder shrug is normal.  Motor examination shows normal tone, bulk,  and strength.  There is no pronator drift.  Coordination shows dysmetria  involving the left side, particularly the left leg.  No tremors are  noted.  Reflexes are symmetric.  Sensation involving the extremities is  normal bilaterally.   Head CT scan shows circular hypodensity involving the middle cerebellar  peduncle on the left side, suggestive of  a lacunar-type infarct in that  location.   LABORATORY EVALUATION:  WBC 8.2, hemoglobin 13, platelet count 426.  Sed  rate 51.  INR 1.2, sodium 134, potassium 4, chloride 97, glucose 150,  BUN 9, creatinine 0.8, CO2 32, calcium 8.6.  Liver enzymes normal.  Urinalysis shows trace blood with WBCs 0-2, RBCs 7-10.   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  Again, this most likely is a small vessel, although we  certainly should make sure that his larger vertebrobasilar system is not  insufficient.   RECOMMENDATIONS:  Brain MRA/MRI.  Continue with aspirin and homocysteine  level.   Thanks for this consultation.      Kofi A. Merlene Laughter, M.D.  Electronically Signed     KAD/MEDQ  D:  02/17/2007  T:  02/17/2007  Job:  [id number]

## 2011-03-19 NOTE — Op Note (Signed)
NAMEZOEL, BASSANI NO.:  [id number]   MEDICAL RECORD NO.:  [id number]          PATIENT TYPE:  INP   LOCATION:  IC06                          FACILITY:  [facility]   PHYSICIAN:  Felicie Morn, M.D. DATE OF BIRTH:  [birth year]   DATE OF PROCEDURE:  02/06/2007  DATE OF DISCHARGE:                               OPERATIVE REPORT   SURGEON:  Felicie Morn, M.D.   PREOPERATIVE DIAGNOSIS:  Acute cholecystitis, cholelithiasis.   POSTOPERATIVE DIAGNOSIS:  Acute cholecystitis, cholelithiasis   OPERATION/PROCEDURE:  Attempted laparoscopic cholecystectomy, conversion  to open cholecystectomy.   GROSS OPERATIVE FINDINGS:  The patient had a very thickened gallbladder  which was intrahepatic, that looked very extremely inflamed and  thickened and possibly as if neoplasm was present.  This was very  attached to the transverse colon mesentery as well.  A great deal of  inflammation at the fundus of the gallbladder.  There was also noted a  liver abscess.   SPECIMENS:  Cultures were taken from the abscess.  The gallbladder and  stones were sent as specimens and there was a portion of the liver that  was attached to the gallbladder as well.  We removed this  intrahepatically.   DESCRIPTION OF PROCEDURE:  The patient was placed in supine position.  After the adequate administration of general anesthesia via endotracheal  intubation, his entire was prepped with Betadine solution, draped in the  usual manner.  Foley catheter was aseptically inserted and the oral  gastric tube as well.  A periumbilical incision was carried out over the  superior aspect of the umbilicus.  The fascia was grasped with a sharp  towel clip and a Veress needle was inserted and confirmed position with  a saline drop test.  The abdomen was insufflated with approximately 3.5  liters of CO2.  We then placed the 11-mm cannula in the umbilicus using  the Visiport technique and under direct vision, 11 mm  cannula was placed  in the epigastrium.  Observing the liver and the gallbladder area, it  was obvious that there was a great deal of adhesions, making  laparoscopic cholecystectomy fraught with undue risk and danger and I  aborted the procedure right away.  No lateral trocar and cannulas were  placed.   We kept a pneumoperitoneum to help [place] enter the abdomen and then a right  subcostal incision was carried out through skin, subcutaneous tissue,  dividing the rectus muscle with a cautery device and entering the  posterior sheath and the peritoneum.  We explored the abdomen with the  above findings.  Dissection the gallbladder was very tedious in order to  avoid damage or injury to the transverse colon.  This was done without  any damage, I should say, and the gallbladder, which was grossly  thickened and granular in  appearance, almost looking neoplastic, was  dissected down towards the cystic duct.  We then ligated this with 2-0  silk and oversewed this with 2-0 silk, removing the gallbladder and  multiple small stones with it.  We irrigated the liver bed and  controlled  bleeding with a cautery device.  We irrigated the area of the  abscess cavity.  We placed a Jackson-Pratt drain in this area and also  Surgicel within the liver bed.  The Jackson-Pratt drain was placed  through a separate stab wound incision.  After checking for hemostasis  and irrigating and then checking, there was absolutely no bile leak or  anything untoward noted.  We dissected very carefully down and if  anything, we had left some distal tissue in order to make sure that we  did not do any damage to the common bile duct.  And as stated, this was  doubly ligated with 2-0 silk and a large silver clip placed over it.  We  then, after irrigating and the abdomen checked for hemostasis, we then  closed the peritoneum with a running 0 Polysorb suture and the fascia  with interrupted figure-of-eights 0 Polysorb sutures.   The skin was  approximated with a stapling device.  Fascia was closed with 0 Polysorb  in the area of the umbilicus where the laparoscopic surgery was  initiated.  We then closed the skin with a stapling device on both  incisions.  The drain was sutured in place with 3-0 nylon.  I used at  least 10 mL 0.5% Sensorcaine along the incision to help with  postoperative discomfort.  A sterile dressing was applied.  Prior to  closure all sponge, needle, and instrument counts were found to be  correct.  Estimated blood loss was approximately 150 mL.  The patient  received 1800 mL of crystalloids intraoperatively.  There were no  complications.  The patient was taken to recovery room in satisfactory  condition with Foley catheter and a nasogastric tube inserted.   We will have the cardiologist continue to follow this patient.  This  patient is to remain on the hospitalist service and I will follow  surgically.      Felicie Morn, M.D.  Electronically Signed     WB/MEDQ  D:  02/06/2007  T:  02/07/2007  Job:  [id number]   cc:   [facility]   [facility] Hospitalist Service

## 2011-03-19 NOTE — H&P (Signed)
NAME:  Charles, [name] NO.:  [id number]   MEDICAL RECORD NO.:  [id number]          PATIENT TYPE:  INP   LOCATION:  A310                          FACILITY:  [facility]   PHYSICIAN:  Audria Nine, M.D.DATE OF BIRTH:  11-Mar-1941   DATE OF ADMISSION:  02/25/2007  DATE OF DISCHARGE:  LH                              HISTORY & PHYSICAL   PRIMARY CARE PHYSICIAN:  [place]:  1. Acute abdominal pain.  2. Gallbladder fossa fluid collection, possible gallbladder leak      versus abscess, status post cholecystectomy within the past 3      weeks.  3. History of acute ischemic cerebrovascular accident about a week      ago.  4. Poorly controlled type 2 diabetes.  5. History of coronary artery disease.  6. History of Clostridium difficile diarrhea with completion of      therapy.   CHIEF COMPLAINT:  Acute abdominal pain with nausea and vomiting.   HISTORY OF PRESENT ILLNESS:  [name] is a 70 year old African-  American male who is well known to [place] from the hospital service.  The  patient has essentially been in the hospital over the past 1 month.  He  had an open cholecystectomy back on February 05, 2006.  This was performed  by Felicie Morn, M.D.  He did have some trouble after the  cholecystectomy including complications of Clostridium difficile  diarrhea.  Also after he was discharged home, he returned about a week  and a half ago with weakness on his left side.  He was confirmed to have  a cerebrovascular accident.  He also indicated that during that  admission that it was determined that he would be a great candidate for  home physical therapy.  The patient was discharged home on February 20, 2007.  He presented to the emergency room twice on April 26, with nausea  and vomiting and also pain on the left side.  Apparently the patient  received some pain medication with anti nausea medication and was  discharged.  When he was sent home  he continued to have more abdominal  pain rated as 10/10, diffuse and radiating to his back.  He subsequently  re-presented to the emergency room where a CT scan was performed which  revealed abnormality with the gallbladder recess.  The patient is now  being admitted for further evaluation and management.  He was also noted  to have elevated liver function tests.  I started him empirically on  Unasyn and Dilaudid for pain control.  We will be requesting GI to see  him.   REVIEW OF SYSTEMS:  He denies any fevers or chills.  His diarrhea has  completely resolved.  His stool this morning was actually well-formed.  He denies any headaches or dizziness.  He is a little stronger on his  left side now.  He has no frequency, urgency, or dysuria.   PAST MEDICAL HISTORY:  1. Hypertension.  2. Poorly controlled type 2 diabetes.  3. History of coronary artery disease status post  coronary artery      bypass graft surgery.  4. History of hypertension.  5. History of dyslipidemia.  6. Status post cholecystectomy February 01, 2007.  7. 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  8. Cyst removed from his thyroid.  9. History of Clostridium difficile diarrhea x2 in the past.   MEDICATIONS:  1. Omeprazole 20 mg p.o. once a day.  2. Clonazepam 0.25 mg as needed.  3. Lisinopril 5 mg p.o. once a day.  4. Metformin 1000 mg p.o. b.i.d.  5. Aspirin 325 mg p.o. once a day.  6. Simvastatin 40 mg at bedtime.  7. Metoprolol 25 mg p.o. b.i.d.  8. Citalopram 20 mg p.o. daily.  9. NovoLog Insulin 15 in a.m. and 30 at night.  10.Regular Insulin 6 units t.i.d.   ALLERGIES:  The patient denies any drug allergies.   SOCIAL HISTORY:  The patient is married and lives with his wife.  He  does not smoke.  He quit smoking several years ago.  He denies any  alcohol use.  He has five children.   FAMILY HISTORY:  Noncontributory.   PHYSICAL EXAMINATION:  GENERAL:  The patient was conscious and  alert,  comfortable, not in acute distress, and was pleasant.  VITAL SIGNS:  On arrival in the emergency room, he described his pain  then as 10/10.  His blood pressure 141/68, pulse 81, respirations 16,  temperature 98.4 degrees Fahrenheit, oxygen saturation was 97% on room  air.  HEENT:  Normocephalic and atraumatic.  Oral mucosa was moist with no  exudates.  NECK:  Supple, no JVD, and no lymphadenopathy.  LUNGS:  Clear clinically with good air entry bilaterally.  Jugular vein  was distended.  ABDOMEN:  Previous open laparotomy scar was well-healed.  The patient  has some diffuse tenderness in his lower abdomen.  He had no rigidity.  Bowel sounds were hypoactive.  EXTREMITIES:  No edema.  No induration or tenderness was noted.  NEUROLOGY:  The patient was conscious and alert, well-oriented to time,  place, and person.  He had very minimal residual weakness on his left  extremities.   LABORATORY/DIAGNOSTIC DATA:  White blood cell count was 11,000,  hemoglobin 12.2, hematocrit 35.7, platelet count 305 with 85%  neutrophils.  Sodium 136, potassium 4.0, chloride 99, CO2 29, glucose  182, BUN 10, creatinine 1.22.  Total bilirubin 2.5, alkaline phosphatase  160, AST 149, ALT 216, total protein 6.7, albumin 3.0, calcium 8.1.   CT scan of the abdomen and pelvis shows a cholecystectomy site  postoperative collection measuring 5.9 x 3.6 x 4.9 with small amount of  air.  There is concern that this was possibly postoperative abscess,  leak, or combination of the two.  There was also evidence of  inflammatory reaction of the adjacent colon with parapancreatic lymph  node enlargement.   Chest x-ray showed no acute cardiopulmonary pattern.   ASSESSMENT:  [name] is a 70 year old African-American male who has  had a fairly rough one month.  The patient was initially admitted to the hospital with acute abdominal pain and signs consistent with acute  cholecystitis.  He had significant  gallbladder sludge.  He underwent  open cholecystectomy by Dr. Romona Curls.  This has been complicated by  Clostridium difficile diarrhea and the patient also survived a  cerebrovascular accident a few days later after he was discharged home.  He returns now on a second visit to the emergency room in the last 24  hours  with increasing abdominal pain, mostly right upper quadrant.  He  also has nausea and vomiting.  CT scan of his abdomen and pelvis  suggests a fluid collection in the gallbladder fossa with air.  This  raises concern for possible postoperative abscess or postoperative  biliary leak.  I believe the risk of intra-abdominal abscess is  significantly elevated.   PLAN:  Admit him to the medical floor.  We will start him on IV Unasyn 3  grams IV q.6 hours.  We will request gastroenterology and GI to see him  for evaluation.  We will resume all of his previous medications for now.  We will put him on Lovenox for DVT prophylaxis.  The patient may need a  CT guided drainage even possibly ERCP with stenting if he does have a  biliary leak.  Overall the patient has multiple medical problems  including his diabetes and a recent stroke raising his overall morbidity  risk.   We will await GI and general surgery input regarding his status.  As  mentioned above, we will be aggressive with control of his diabetes as  it has been quite difficult to control in the past.  I have explained  the above to the patient and he provides full understanding.      Audria Nine, M.D.  Electronically Signed     AM/MEDQ  D:  02/26/2007  T:  02/27/2007  Job:  [id number]

## 2011-03-19 NOTE — Discharge Summary (Signed)
NAMEMAVRIK, FREIMARK NO.:  [id number]   MEDICAL RECORD NO.:  [id number]          PATIENT TYPE:  INP   LOCATION:  A326                          FACILITY:  [facility]   PHYSICIAN:  Anselmo Pickler, DO    DATE OF BIRTH:  1941-07-07   DATE OF ADMISSION:  02/01/2007  DATE OF DISCHARGE:  04/14/2008LH                               DISCHARGE SUMMARY   ADMISSION DIAGNOSES:  1. Right upper quadrant pain.  2. Diabetes.  3. Hypertension.  4. Hyponatremia.   DISCHARGING DIAGNOSES:  1. Cholelithiasis with status post cholecystectomy.  2. Clostridium difficile.  3. Diabetes.  4. Hypertension.   There were several consults.  The patient was seen by GI, surgery, and  [facility].   The patient had an ultrasound done on April 3, which showed thickening  of the gallbladder and sludge as well as a CT of the abdomen and pelvis  that showed acute cholecystitis without biliary dilation and mild  cardiomegaly, and at the pelvis was no acute abnormalities found.   The patient is a 70 year old African-American male who presented to the  emergency room with right lower quadrant pain.  He had had this pain  that started yesterday.  It was gradual in onset but then had become  severe, rating a 10/10.  He denies any radiation.  It stayed in the  right lower quadrant area.  He denies any constipation, fever or  vomiting.  The patient said that he had had a history of cholecystitis  in the past but while he was being evaluated for this, they found that  he had problems with his heart and ended up having subsequent bypass  surgery prior to having his gallbladder evaluated and having surgery on  it.   Upon physical exam, pertinent positives were that the patient did have  right upper quadrant pain with radiation to the lower quadrant.  No  rigidity or guarding.  Abdomen was nondistended.  Bowel sounds were  positive and appreciated.  There was no edema noted in any of the  extremities.   LABORATORY TESTS:  White blood count was 5.17, hemoglobin 12.4,  hematocrit 36.6, and platelets were 344.  Looking at liver enzymes, AST  was 173, ALT 243.  Amylase 71, lipase 32.  Total bilirubin was 1.8.  BUN  was 7, creatinine was 0.84, glucose was 337.  Sodium was 132, potassium  4.3, chloride 96, CO2 29.   After evaluation from surgery, it was decided that the patient should  have a cholecystectomy performed and he was taken to surgery and the  gallbladder was removed.  During his hospital stay his blood pressure  was kept within moderate control.  His blood sugars were monitored via a  sliding scale and was kept within moderate control and his pain level  was monitored and given pain medications to address that.  Toward the  last few days of his visit, the patient developed severe diarrhea.  A  stool culture was performed and found that the patient had C. difficile.  Upon this notice, the patient was started on  Flagyl 500 mg.  He was then  discharged by surgery and by GI and by cardiology at this point in time.  It was felt that the patient could go home and see his primary care  physician at the [facility] to follow up for the diarrhea and the C. difficile  and was given explicit direction that if the diarrhea did persist at  this level more than 48 hours, to return back to the emergency room for  evaluation.  The patient stated understanding.   The patient was then discharged on all home medications at previous  dose, was given Flagyl 500 mg one by mouth three times a day x10 days.  He was discharged to home stable, to return to his physician at the [place]  in [place] within 7-10 days, and told to follow up and given the  indications as to if he had any pain at his surgery site, increase in  temperature or increase in diarrhea, to return to the emergency room  immediately.      Anselmo Pickler, DO  Electronically Signed     CB/MEDQ  D:  02/16/2007  T:  02/17/2007   Job:  [id number]

## 2011-03-19 NOTE — Consult Note (Signed)
NAME:  [name], BEGGS NO.:  [id number]   MEDICAL RECORD NO.:  [id number]          PATIENT TYPE:  INP   LOCATION:  A316                          FACILITY:  [facility]   PHYSICIAN:  R. Garfield Cornea, M.D. DATE OF BIRTH:  1941/04/10   DATE OF CONSULTATION:  DATE OF DISCHARGE:                                 CONSULTATION   REASON FOR CONSULTATION:  Hyperbilirubinemia, cholecystitis,  questionable ERCP.   REQUESTING PHYSICIAN:  Dr. Romona Curls   HISTORY OF PRESENT ILLNESS:  [name] is a 70 year old male with  history of recurrent episodes of biliary colic.  He complains of severe  right upper quadrant pain which began yesterday morning, does radiate  through to his back.  Pain is 10/10 at worst.  The pain waxes and wanes.  He denies any history of nausea, vomiting.  He describes the pain as  cramping and unrelenting.  He has complained of dark urine.  He denies  any fever or chills.  His weight has remained stable.  Denies any clay-  colored stools.  Denies any jaundice or pruritus.  No known history of  liver disease.  His transaminases were around 200 yesterday.  Today, his  alkaline phosphatase is 170, AST 173, ALT 243, total bilirubin was 1.8  which yesterday was 1.4, direct 0.7.  Total protein 6.3 and albumin 2.7.  Abdominal ultrasound from February 02, 2007, shows a 9-mm gallbladder wall,  sludge, small pericholecystic fluid, CBD of 5 mm, an echogenic liver, a  left exophytic renal cyst 2.4 x 1.8 x 1.9 cm.  CT of the abdomen and  pelvis shows acute cholecystitis, mild cardiomegaly, and a 25% T12  compression fracture.   PAST MEDICAL AND SURGICAL HISTORY:  Coronary artery disease status post  CABG July 2007.  At the same time he was diagnosed with cholecystitis.  This was postponed due to his heart surgery.  He has insulin-dependent  diabetes mellitus, hypertension, hyperlipidemia, depression, GERD which  is well controlled.  He had a [facility] with  adenomatous  polyps last year.  He has had a benign thyroid cyst removed.   MEDICATIONS PRIOR TO ADMISSION:  1. Omeprazole 20 mg daily.  2. Clonazepam 0.25 mg p.r.n.  3. Lisinopril 5 mg daily.  4. Metformin 1 g b.i.d.  5. Aspirin 325 mg daily.  6. Zocor 40 mg q.h.s.  7. Metoprolol 25 mg b.i.d.   ALLERGIES:  No known drug allergies.   FAMILY HISTORY:  No know family history of colorectal carcinoma or liver  disease.  Father deceased at age 88 secondary to coronary artery  disease.  Mother deceased in her 25s secondary to coronary artery  disease with history of diabetes mellitus and hypertension.   SOCIAL HISTORY:  He is married.  He has one healthy son.  He is retired  from [employer].  He has a 25 pack-year history of tobacco use.  Denies  alcohol or drug use.   REVIEW OF SYSTEMS:  CONSTITUTIONAL:  Weight has remained stable.  He has  a couple-month history of fatigue.  CARDIOVASCULAR:  Denies  any chest  pain or palpitations.  PULMONARY:  Denies shortness of breath, dyspnea,  cough, hemoptysis.  GI:  See HPI.  Denies any rectal bleeding or melena,  constipation or diarrhea. GU:  See HPI.  Denies any dysuria, hematuria,  increased urinary frequency.   PHYSICAL EXAMINATION:  VITAL SIGNS:  Weight is 106 kg, height 69 inches,  O2 saturation 99% on room air, temperature 97.9, pulse 67, respirations  20, and blood pressure 131/72.  GENERAL:  [name] is a 70 year old obese African-American male who is  alert, oriented, pleasant and cooperative in no acute distress.  He is  accompanied by his wife today.  HEENT:  Sclerae clear, nonicteric.  Conjunctivae pink.  Oropharynx pink  and moist without any lesions.  NECK:  Supple without mass or thyromegaly.  CHEST:  Heart regular rate and rhythm, normal S1 and S2 without any  murmurs, clicks, rubs or gallops.  LUNGS:  Clear to auscultation bilaterally.  ABDOMEN:  Obese with positive bowel sounds x4.  No bruits auscultated.  Abdomen is  soft, nondistended.  He does have tender hepatomegaly.  Liver  is palpable four fingerbreadths below the right costal margin.  He has  epigastric tenderness.  There is no rebound tenderness or guarding.  Unable to palpate splenomegaly.  EXTREMITIES:  Without clubbing or edema bilaterally.   LABORATORY STUDIES:  Wbc's 5.7, hemoglobin 12.4, hematocrit 36.6,  platelets 344.  Calcium 7.9, sodium 132, potassium 4.3, chloride 96, CO2  29, BUN 7, creatinine 0.84, and glucose 337.  Amylase and lipase were  normal.  His urinalysis showed glycosuria, small amount of bilirubin,  trace protein, and an elevated urobilinogen.   IMPRESSION:  [name] is a 70 year old male with chronic  cholecystitis, gallbladder sludge, possible small stones.  There is no  evidence of common bile duct dilatation.  His bilirubin is 1.8.  At this  point, no significant evidence of choledocholithiasis.  Will recheck in  the morning.  If he has a significant bump in his bilirubin and  transaminases will reconsider need for ERCP at that time.   PLAN:  1. N.p.o. past midnight.  2. Repeat LFTs in the a.m.  3. Will be rechecked by Dr. Gala Romney in the morning.  4. Would consider liver biopsy at the time of surgery as well.   We would like to thank Dr. Romona Curls for allowing [place] to participate in  the care of Mr. Wee.      Les Pou, N.P.      Bridgette Habermann, M.D.  Electronically Signed    KC/MEDQ  D:  02/02/2007  T:  02/02/2007  Job:  [id number]

## 2011-03-19 NOTE — Discharge Summary (Signed)
[name], [name] NO.:  [id number]   MEDICAL RECORD NO.:  [id number]          PATIENT TYPE:  INP   LOCATION:  A310                          FACILITY:  [facility]   PHYSICIAN:  Salem Caster, DO    DATE OF BIRTH:  [birth year]   DATE OF ADMISSION:  02/25/2007  DATE OF DISCHARGE:  04/29/2008LH                               DISCHARGE SUMMARY   DISCHARGE DIAGNOSES:  1. Acute abdominal pain.  2. Elevated liver function enzymes.  3. Subhepatic fluid collection.  4. Recent acute ischemic cerebellar accident.  5. Poorly controlled diabetes.  6. Coronary artery disease.   BRIEF HOSPITAL COURSE:  [name] is a 70 year old African-American  male who has been admitted to the Hospice service multiple times over  the past month.  The patient had an open cholecystectomy back on February 06, 2007.  The patient did have some complications after surgery.  He had  some C. Difficile diarrhea.  The patient was discharged after his  cholecystectomy, returned a week later and had an acute ischemic CVA on  the left side.  The patient has had some weakness since this event.  The  patient returned prior to getting any physical therapy at home.  The  patient was seen by Neurology, again discharged.   The patient then presented back to the emergency room and was admitted  on February 26, 2007, complaining of some lower abdominal pain.  The  patient was seen in the emergency room.  The patient had a CT of the  abdomen performed which showed fluid collection possibly representing  postoperative abscess/leak.  Surgery was re-consulted as well as  Gastroenterology.   The patient was sent to [facility] for possible percutaneous drainage of  this fluid.  The patient was seen at [facility], but returned to [facility] secondary to improving symptoms.  The patient's symptoms have  improved and this fluid collection has also greatly improved over the  last 2 days.  The patient, at this point, is  stable enough for  discharge.   DISCHARGE MEDICATIONS:  1. Aspirin 81 mg daily.  2. Celexa 20 mg daily.  3. Lopressor 25 mg b.i.d.  4. Lisinopril 5 mg daily.  5. Clonazepam 0.25 mg p.r.n.  6. Omeprazole 20 mg daily.  7. NovoLog 8 units in the a.m., 15 units in the p.m.  8. Regular insulin 6 units t.i.d.  9. Augmentin 875 mg p.o. b.i.d. x7 days.  Will defer to patient's primary medical physician regarding the restart  of his Metformin at this time.   CONDITION ON DISCHARGE:  Stable.   DISPOSITION:  He will be discharged to home.  Wife has consulted the [facility]  in [place] regarding the patient's being sent for inpatient physical  therapy.  This would be evaluated to see if patient needs to be  transferred from this facility or patient will be seen as an outpatient  at the [facility] in [place].  The patient does need physical therapy to  recover from his ischemic event.   DISCHARGE FOLLOWUP:  The patient is to  follow up with his primary care  physician in the next 7-10 days.  The patient is also to follow up with  Dr. Melony Overly in the next 2 weeks for monitoring of his elevated liver  enzymes.      Salem Caster, DO  Electronically Signed     SM/MEDQ  D:  02/28/2007  T:  02/28/2007  Job:  [id number]   cc:   Primary Care Physician   Dr. Melony Overly

## 2011-03-19 NOTE — H&P (Signed)
[name], [name] NO.:  [id number]   MEDICAL RECORD NO.:  [id number]                   PATIENT TYPE:  EMS   LOCATION:  ED                                   FACILITY:  [facility]   PHYSICIAN:  Karlyn Agee, M.D.              DATE OF BIRTH:  1941/02/10   DATE OF ADMISSION:  01/27/2004  DATE OF DISCHARGE:                                HISTORY & PHYSICAL   PRIMARY CARE PHYSICIAN:  Tesfaye D. Legrand Rams, M.D.   CHIEF COMPLAINT:  Nausea, vomiting, diarrhea and syncope this morning.   HISTORY OF PRESENT ILLNESS:  This is a 70 year old obese, African-American  man with a history of diabetes who was in fairly good health at work this  morning.  He had started having nausea and vomiting with diarrhea, 2  episodes of vomiting and diarrhea this morning, but felt well enough to go  to work.  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  He apparently had a syncopal episode lasting less than a  minute.  He was brought to the emergency room.   The patient denies any chest pain, shortness of breath, or diaphoresis.  The  patient does not smoke.  His lipid status is unknown, but he does have a  strong family history of hypertension, diabetes, and heart disease.   PAST MEDICAL HISTORY:  1. Insulin-dependent diabetes mellitus.  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   MEDICATIONS:  1. Insulin 70/30, 25 units in the morning and 20 units in the evening.  2. Glucophage 1000 mg twice daily.  3. Eye drops for his right eye, does not know the name or dose.   ALLERGIES:  Aspirin causes him to have an upset stomach.   SOCIAL HISTORY:  Discontinued the use of alcohol and tobacco more  than 20  years ago; was never a very heavy user.  Denies illicit drug use.  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 He has been married  for 9 years.  He has 1 child and 4 step-children.  His son has gallstones,  gallbladder disease.  He has 5 siblings 3 of whom have diabetes.  His mother  died at age 43 from congestive heart failure.  She had diabetes and  hypertension.  His father died from an MI at age 63.   REVIEW OF SYSTEMS:  Positive for glaucoma.  No history of cataracts.  Has  been having post nasal drip for the past 1-2 months, no headache, syncope or  seizures, apart  from as described in the HPI.  No orthopnea, PND, or lower  extremity edema.  Exercise tolerance is good.  No fever, cough, or cold.  No  weight loss.  Nausea, vomiting and diarrhea as in the HPI.  No jaundice.  No  dysuria, frequency, or hematuria, but has been having, recently, polyuria  and polydipsia and very thirsty, which he presumed to be his diabetes out of  control.  No arthritis or musculoskeletal problems.   PHYSICAL EXAMINATION:  GENERAL:  This is an obese and very pleasant, African-  American gentleman lying on the stretcher in no apparent distress.  No chest  pain at this time.  VITAL SIGNS:  His temperature is 99.6, his pulse is 80, his blood pressure  is 137/70.  His respirations are 20 and he is having no pain.   LAB RESULTS:  His white count is 5.9, hematocrit 47, MCV 88, RDW 10.9 and  platelet count 236.  He has 83% neutrophils.  His absolute granulocyte count  is normal at 4.9.  His chemistries:  Sodium 133, potassium 4.1, chloride  104, CO2 23, glucose 285, BUN 13, creatinine 0.9, calcium 8.3.  His total  protein 6, albumin is 3.1.  AST and ALT 19 and 28, alkaline phosphatase 39.  His total bilirubin is 0.4.  His total CK is 535; his CK/MB is 1.7; and his  troponin less than 0.01.  His EKG shows normal sinus rhythm; it is reading  as an anterior fascicular block.  There  is questionable left atrial  enlargement, not striking.  There are no T wave changes suggestive of  ischemia.   ASSESSMENT:  Acute syncope in an obese man with diabetes which is not well  controlled at this time. Although the enzymes are not immediately suggestive  of a cardiac cause, it would be worthwhile to admit him to rule out an acute  cardiac cause for this syncope. If he has 3 sets of cardiac enzymes we would  feel satisfied, and also if his EKG remains nonischemic for this.  We will  admit him, we will hydrate him.  We will put him on a sliding scale to  control his insulin.  We will put him on a graduated diet for his  gastroenteritis.     ___________________________________________                                         Karlyn Agee, M.D.   LC/MEDQ  D:  01/27/2004  T:  01/27/2004  Job:  [id number]

## 2011-03-19 NOTE — Group Therapy Note (Signed)
NAMEOSIAS, FISCH NO.:  [id number]   MEDICAL RECORD NO.:  [id number]          PATIENT TYPE:  INP   LOCATION:  A210                          FACILITY:  [facility]   PHYSICIAN:  Audria Nine, M.D.DATE OF BIRTH:  [birth year]   DATE OF PROCEDURE:  02/20/2007  DATE OF DISCHARGE:                                 PROGRESS NOTE   SUBJECTIVE:  The patient feels a little better today.  His left facial  numbness and left leg weakness is better.  He denies any fevers or  chills.  He has no headaches.  He has no difficulty swallowing.  His  ambulation is also improving.  Physical therapy thinks that the patient  may go home for home PT.  The patient's diarrhea is also significantly  improved, and has had only one episode of semisolid stools in the past  12 hours.   PHYSICAL EXAMINATION:  VITAL SIGNS:  His blood pressure was 132/71,  pulse 77, respirations 18, temperature 98.2.  HEENT:  Normocephalic and atraumatic.  Oral mucosa was moist, with no  exudates.  NECK:  Supple, no JVD.  LUNGS:  Clear.  Good air entry bilaterally.  HEART:  S1 and S2 regular.  No S3, S4, gallops or rubs.  ABDOMEN:  Soft and nontender.  Bowel sounds positive.  No masses  palpable.  EXTREMITIES:  No edema.  CNS EXAMINATION:  The patient continues to have mild left hemiparesis;  power is about 4/5 in both upper and lower extremities, with continued  decreased sensation on the left side of the face.   LABORATORY/DIAGNOSTIC DATA:  White blood cell count 7.6, hemoglobin  12.4, hematocrit 36.7, platelet count 389 with no left shift.  Sodium  137, potassium 4.1, chloride 100, CO2 30, glucose 220, BUN 10,  creatinine 1.01, calcium 8.7.   ASSESSMENT AND PLAN:  68. A 70 year old African-American male who was admitted with an acute      cerebrovascular accident involving his left side.  Head CT      confirmed evidence of acute ischemic stroke.  The patient is doing      much better.  As per physical  therapy, he is not ready to be      discharged yet but would benefit from home physical therapy.  I      also discussed the possible needs at home with the wife, and she      said she might need home health aide -- especially to bathe him in      the morning.  I requested home services to see him.   His blood sugars are slight improved.  Will continue on current therapy.  The patient's wife assured me that his blood sugars are a lot better  usually at home.   1. CLOSTRIDIUM DIFFICILE.  Diarrhea is improved.  He denies any      cramping.  No significant leukocytosis.  He is currently on Flagyl      500 mg p.o. t.i.d.  I think this may be discontinued in the next 3-      4 days.  Disposition overall is improving.  Await physical therapy final  recommendations regarding discharge.  Continue aggressive blood sugar  control.      Audria Nine, M.D.  Electronically Signed     AM/MEDQ  D:  02/20/2007  T:  02/20/2007  Job:  [id number]

## 2011-03-19 NOTE — Consult Note (Signed)
NAME:  [name], HRABE NO.:  [name]   MEDICAL RECORD NO.:  [id number]          PATIENT TYPE:  INP   LOCATION:  A310                          FACILITY:  [facility]   PHYSICIAN:  R. Garfield Cornea, M.D. DATE OF BIRTH:  09-Oct-1941   DATE OF CONSULTATION:  02/26/2007  DATE OF DISCHARGE:                                 CONSULTATION   REASON FOR CONSULTATION:  Abdominal pain, abnormal CT.   HISTORY OF PRESENT ILLNESS:  Mr. Oleg Goggans. Giacomo is a pleasant, 70-year-  old, African-American male with numerous medical problems who is status  post open cholecystectomy back on April 7. He presented approximately 10  days ago with mental status changes and was found to have a left  cerebellar infarct and was seen by Dr. Merlene Laughter. He has had fairly good  return of his neurologic function although he has some difficulties with  his movement on the left side. Physical therapy is being set up through  the [facility] system. Otherwise, directly  related to cholecystectomy he did well until the past 2 days when he  started having boring right upper quadrant abdominal pain. He has not  had any associated nausea or vomiting, he has not had any melena,  hematochezia or hematemesis. At times he has rated the pain 10/10. It  has improved with IV narcotic therapy since hospitalized. His white  count yesterday is 11,000, today it is 8200, H&H 12.3 and 37.0, MCV  90.3, platelet count 262,000. His LFTs are notable for total bilirubin  of 2.3, it was 2.5 yesterday, it is not fractionated. Alkaline  phosphatase 149 and was 160 yesterday. AST and ALT 93 and 180 today, was  149 and 216 yesterday. Albumin is 2.7.  CT of the abdomen with IV and  oral contrast done yesterday demonstrated fluid collection in the  gallbladder fossa with air present. I have reviewed these films  personally with Dr. Vernard Gambles today and there is concern for either a  bowel leak or an abscess. The  fluid collection is approximately 5 x 6 x  4 cm.   This gentleman is on IV Unasyn receiving Dilaudid for analgesia and  p.r.n. Zofran. He is also on Lovenox 30 mg subcu q.24 hours.   It is notable this gentleman was found to have a very thickened  gallbladder wall back on April 3rd with sludge and pericholecystic fluid  suggestive of cholecystitis. He also had an echogenic liver consistent  with mild fatty infiltration.   PAST MEDICAL HISTORY:  1. Hypertension.  2. Diabetes.  3. Coronary artery disease.  4. GERD.  5. Depression.  6. Dyslipidemia.  7. Recent cerebellar stroke.   PAST SURGICAL HISTORY:  1. Cystectomy (thyroid gland).  2. Status post four-vessel coronary artery bypass grafting status post      open cholecystectomy 3 weeks ago.  3. He did have an echocardiogram earlier this month on April 18 which      revealed moderate severe left ventricular hypertrophy with left      ventricular EF 50-55%, some calcifications of the aortic valve.  He saw Dr. Merlene Laughter in consultation on April 18. He felt the patient had  a lacunar infarct in the left cerebellar peduncle.   MEDICATIONS ON ADMISSION:  1. Omeprazole 20 mg orally daily.  2. Clonazepam.  3. Lisinopril.  4. Metformin.  5. Aspirin 325 mg daily.  6. Simvastatin.  7. Metoprolol.  8. Citalopram.   ALLERGIES:  No known drug allergies.   FAMILY HISTORY:  No chronic GI or liver disease.   SOCIAL HISTORY:  The patient has been married for 14 years, has 5  children. He has worked for [employer] previously. No  tobacco, no alcohol.   REVIEW OF SYSTEMS:  No recent chest pain or dyspnea on exertion. Has not  had any fever or chills recently. No odynophagia, dysphagia, early  satiety, nausea or vomiting.   PHYSICAL EXAMINATION:  GENERAL:  Reveals a pleasant, elderly-appearing  gentleman in no acute distress accompanied by his wife.  VITAL SIGNS:  Temperature 97.8, pulse 80, respiratory rate 20, blood   pressure 114/84. Admission weight in pounds 225.5.  SKIN:  Warm and dry.  HEENT:  No scleral icterus. Conjunctiva pink. Oral cavity no lesions.  LUNGS:  Clear to auscultation.  CHEST:  He has a median sternotomy site with keloid formation.  CARDIAC:  Regular rate and rhythm without murmur, gallop or rub.  ABDOMEN:  Obese. He has a well healing right upper quadrant surgical  scar from cholecystectomy. Positive bowel sounds, soft. He is  significantly tender over the area of the right upper quadrant incision  in the right upper quadrant. He does not have an obvious mass or  organomegaly. There is no rebound tenderness.  EXTREMITIES:  He has trace lower extremity edema.   ADDITIONAL LABORATORY DATA:  Sodium 141, potassium 4.6, chloride 102, CO  34. Glucose 130, BUN 9, creatinine 1.23. Urinalysis trace ketones.   ADMITTING IMPRESSION:  Mr. Heman Salah. Viger is a pleasant, 70 year old  gentleman who is about 3 weeks out from an open cholecystectomy. His  postop course has been complicated with a left cerebellar infarct  approximately 10 days ago, now admitted with new onset right upper  quadrant abdominal pain. CT suggests a fluid collection of the  gallbladder fossa. There is air in the gallbladder fossa as well. The  differential at this point in time includes postoperative abscess and/or  postoperative biliary leak. The presence of air in this fluid 3 weeks  out from surgery is more suggestive of a possible abscess. A mild  nonspecific elevation in bilirubin and are noted, the significance of  which is unknown.  If he, indeed, does have an abscess, he bump in her  numbers may be secondary to a bystander phenomen.   RECOMMENDATIONS:  Will pursue a HIDA scan stat. If HIDA scan confirms a  leak will make plans for ERCP with stenting however, I am concerned  about the recent CVA. His risk of subsequent general anesthesia would be increased in the acute post stroke setting. An alternative to  this  approach would be to contemplate percutaneous drainage to create a  controlled fistula/fluid drainage.   If the HIDA does not demonstrate a leak, I suspect this nice gentleman  will still need percutaneous drainage as he likely has an abscess. Will  make further recommendations in the very near future.  Repeat hepatic profile in the morning.   I would like to thank the hospitalist team for allowing me to see this  nice gentleman today in consultation.  Bridgette Habermann, M.D.  Electronically Signed     RMR/MEDQ  D:  02/26/2007  T:  02/26/2007  Job:  [id number]   cc:   Felicie Morn, M.D.  Fax: [phone]

## 2011-03-19 NOTE — Procedures (Signed)
NAMEKENTYN, MAGNANI NO.:  [id number]   MEDICAL RECORD NO.:  [id number]          PATIENT TYPE:  INP   LOCATION:  A210                          FACILITY:  [facility]   PHYSICIAN:  Fay Records, MD, FACCDATE OF BIRTH:  09/04/1941   DATE OF PROCEDURE:  02/17/2007  DATE OF DISCHARGE:                                ECHOCARDIOGRAM   REFERRING PHYSICIAN:  [facility] service, Dr. Sherryle Lis.   INDICATIONS:  The patient is a 70 year old with history of CVA and  coronary disease.   FINDINGS:  Two-dimensional echocardiogram with echocardiogram Doppler:  Left ventricle is normal in size with an end-diastolic dimension of 41  mm.  The interventricular septum is severely thickened at 18 mm.  Posterior wall moderately thickened at 15 mm.   Left atrium is mildly enlarged at 45 mm.  Right atrium and right  ventricle are normal.  Aortic root is normal at 34 mm.   The aortic valve is thickened with calcification mainly at the left  coronary cusp.  In the parasternal long axis view, there is  echodensity/echocardiogram bright area noted that probably represents  some calcification of valve.  Cannot completely exclude something  distinct from the leaflet.  No other masses seen.  No aortic  insufficiency.  Consider TE to further evaluate.   Mitral valve is mildly thickened with no insufficiency.  Pulmonic valve  is normal with no insufficiency.  Tricuspid valve is normal with trace  insufficiency.   Overall, LV systolic function is low normal to mildly decreased at 50-  55%.  RVEF is grossly normal.   No pericardial effusion is seen.   IMPRESSION:  1. 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  2. Left ventricular ejection fraction 50-55%.  3. Aortic valve is thickened with calcification.  Cannot completely      exclude a distinct echodensity on the aortic valve.  May indeed      just represent calcification of the valve leaflets themselves.      Consider further evaluation  with transesophageal echocardiogram.      Clinical correlation is indicated.     Fay Records, MD, [facility]  Electronically Signed    PVR/MEDQ  D:  02/17/2007  T:  02/17/2007  Job:  [id number]

## 2011-03-19 NOTE — H&P (Signed)
NAMEJADRIAN, MURPHY NO.:  [id number]   MEDICAL RECORD NO.:  [id number]          PATIENT TYPE:  INP   LOCATION:  A210                          FACILITY:  [facility]   PHYSICIAN:  Anselmo Pickler, DO    DATE OF BIRTH:  [birth year]   DATE OF ADMISSION:  02/16/2007  DATE OF DISCHARGE:  LH                              HISTORY & PHYSICAL   The patient is a 70 year old African-American male who was just  discharged from [facility] on April 14, but returned to the  hospital for complaints of left facial numbness.  The wife states that  since discharge from the hospital the patient had become increasingly  lethargic and had stopped walking.  Stated, too that he had been  sleeping all day and then complained of lip numbness, which has been  increased to facial numbness and then with a mild headache.  Wife stated  that at that point in time that the patient could not walk at all and  then she decided to bring him back to the hospital.   PAST MEDICAL HISTORY:  The patient is currently married, has history of  coronary artery disease, hypertension, diabetes and hyperlipidemia, is  status post cholecystectomy done February 11, 2007.  Also, the patient has  a history of CVAs in the past.   PAST SURGICAL HISTORY:  Cholecystectomy.  Open heart surgery.  Cyst removed from his thyroid.   SOCIAL HISTORY:  He is a non-smoker, non-drinker.   ALLERGIES:  No known drug allergies reported currently.   MEDICATIONS:  The patient was sent home on:  1. Flagyl 500 mg 1 p.o. t.i.d.  2. Omeprazole 20 mg.  3. Clonazepam 0.25 mg as needed.  4. Lisinopril 5 mg daily.  5. Metformin 1,000 mg b.i.d.  6. Aspirin 325 daily.  7. Simvastatin 40 mg q. h.s.  8. Metoprolol tartrate 25 mg q. 12.   REVIEW OF SYSTEMS:  The patient was negative for weight loss or  dizziness.  Positive blurred vision for eyes.  Negative for ear pain or  tinnitus, sore throat or nasal stuffiness.  CARDIOVASCULAR:   The patient  denies any chest pain, shortness of breath or dyspnea or diaphoresis.  RESPIRATORY:  Negative for cough, wheezing or sputum production.  GASTROINTESTINAL:  Negative for nausea, vomiting, diarrhea.  Positive  for appropriate tenderness after lap cholecystectomy.  GU: Negative for  dysuria, flank pain, hesitancy or frequency.  MUSCULOSKELETAL:  Negative  for arthralgias or neck pain.  SKIN:  Negative for rash, abrasions.  NEUROLOGIC:  Positive for headache and positive for right-sided  weakness.   PHYSICAL EXAMINATION:  VITAL SIGNS:  On presentation:  Blood pressure  139/65, heart rate 75, respirations 20, later on in the visit 134/58,  72, he has been saturating at 99%.  GENERAL:  This patient is a 70 year old African-American male who is  well developed, well nourished in no acute distress.  Answers questions  appropriately.  HEAD:  Normocephalic, atraumatic.  Eyes:  PERRLA. EOMI.  No drainage.  Gross acuity is intact.  Turbinates are moist.  Mouth:  No erythema or  exudate or postnasal drip.  NECK:  Soft, nontender, full range of motion, no masses noted.  HEART:  Regular rate and rhythm.  S1, S2 present.  LUNGS:  Clear to auscultation bilaterally.  No wheezes, crackles or  rales noted.  ABDOMEN:  Round, soft, appropriate tenderness at lap cholecystectomy  scars.  Bowel sounds heard in all 4 quadrants.  MUSCULOSKELETAL:  Mild right-sided weakness in lower extremity.  Equal  strength in upper extremities.  Full range of motion in all joints.  No  swelling or tenderness.  LYMPHATICS:  Cervical, axillary and inguinal lymphatics there is no  lymphadenopathy.  NEUROLOGIC:  Cranial nerves II-X are grossly intact.  Reflexes are 5/5,  strength is 4/5.   LABS:  Done on admission.  Urine was done, that was found to be  negative.  Also cardiac markers:  Troponins 0.5, CK MB was 1.  BMP was  done:  Sodium 136, potassium 4.3, chloride 97, carbon dioxide 33,  glucose 233, BUN 11,  creatinine 0.97.  White count per CBC 7.1, 4.12,  hemoglobin 12.2, hematocrit 32.2, platelets 445.  Tests that were done in the ER:  The patient had an EKG done, normal  sinus rhythm with left axis deviation and a CT was done of the head, per  radiologist possible acute ischemic stroke was noted.   ASSESSMENT/PLAN:  The patient was admitted to [facility] with a  diagnosis of acute ischemic cerebrovascular accident.  Started on IV  therapy with a consult to Dr. Merlene Laughter for evaluation, also to have  [facility] [facility] team to interpret a carotid ultrasound and 2D echocardiogram for  left-sided facial numbness.  The patient was then put on all home  medications as well as a sliding scale for his diabetes.  Will continue  to monitor the patient, await further recommendations from cardiology  and neurology.      Anselmo Pickler, DO  Electronically Signed     CB/MEDQ  D:  02/17/2007  T:  02/17/2007  Job:  [id number]

## 2011-03-19 NOTE — Group Therapy Note (Signed)
NAMEMORTEZ, MEADOWCROFT NO.:  [id number]   MEDICAL RECORD NO.:  [id number]          PATIENT TYPE:  INP   LOCATION:  A316                          FACILITY:  [facility]   PHYSICIAN:  Audria Nine, M.D.DATE OF BIRTH:  04-29-1941   DATE OF PROCEDURE:  02/05/2007  DATE OF DISCHARGE:                                 PROGRESS NOTE   SUBJECTIVE:  The patient continues to do well.  He says his right upper  quadrant pain is almost gone completely.  He is awaiting surgery  tomorrow.  The patient was seen by Dr. Romona Curls who was keeping him  n.p.o. from midnight.   OBJECTIVE:  GENERAL:  Conscious, alert, comfortable, not in acute  distress.  VITAL SIGNS:  Blood pressure 121/67, pulse 71, respirations 20,  temperature 98.9 degrees Fahrenheit.  Blood sugars are ranging between  171 and 299.  HEENT:  Normocephalic and atraumatic.  Oral mucosae moist.  No exudates.  NECK:  Supple.  No JVD or lymphadenopathy.  LUNGS:  Clear.  HEART:  S1, S2 regular.  ABDOMEN:  Obese but soft with mild tenderness in the right upper  quadrant but improved from yesterday.  EXTREMITIES:  No edema.   LABORATORY DATA:  White blood cell count 6.9, hemoglobin 12.2,  hematocrit 36.4, platelet count 359.  Sodium 137, potassium 4.4,  chloride 107, CO2 26, glucose 225. BUN 8, creatinine 0.85.  Alkaline  phosphatase 159, AST 40, ALT 118, albumin 2.6, calcium 8.4.  These  numbers are significantly improved from the admission numbers.   ASSESSMENT AND PLAN:  Acute cholecystitis.  He is awaiting surgery by  Dr. Romona Curls tomorrow morning.  The patient is currently hemodynamically  stable, and we do have a cardiac clearance at this time.  He is  currently on ceftriaxone.   I discussed extensively with his wife the expectations of his surgery  and that we will continue medical followup after his surgery.      Audria Nine, M.D.  Electronically Signed     AM/MEDQ  D:  02/05/2007  T:  02/05/2007   Job:  [id number]

## 2011-03-19 NOTE — Consult Note (Signed)
NAME:  [name], [name] NO.:  [id number]   MEDICAL RECORD NO.:  [id number]          PATIENT TYPE:  INP   LOCATION:  A310                          FACILITY:  [facility]   PHYSICIAN:  Felicie Morn, M.D. DATE OF BIRTH:  [birth year]   DATE OF CONSULTATION:  02/26/2007  DATE OF DISCHARGE:                                 CONSULTATION   Surgery was asked to see this 70 year old black male known to me in the  past.  In essence, he was admitted to the medical service with acute  cholecystitis secondary to cholelithiasis approximately 2 weeks ago at  which time he underwent an open cholecystectomy for a severely inflamed  gallbladder.  This patient was treated with an open cholecystectomy, and  he had drainage of his gallbladder fossa for at least a week with a  Jackson-Pratt drain.  At the time that he was discharged, his liver  function studies were normal, and he had minimal serous drainage from  his Jackson-Pratt drain. This was removed, and he remained afebrile, and  since that time has done well to follow up in the office.   However, he was readmitted to the emergency room this time where he  underwent evaluation for a left mini stroke when he complained of  headache, left-sided facial numbness, and left leg weakness.  He was  seen on the medical service for this. He was later discharged, and then  was admitted last night for some lower abdominal pain accompanied by  nausea and vomiting.  He did have a CT scan which showed a fluid  collection, approximately 5 x 4 cm diameter of fluid and some air in  this collection.  Interestingly, he had no complaints of right upper  quadrant pain at all at any time. His pain has been below the umbilicus  in the hypogastrium area, and in this was accompanied by nausea and  vomiting.  This began Friday.  He at no time, however, had any right  upper quadrant pain.  He did experience a bump in his liver enzymes with  a white count on  admission of 11 with 85% neutrophils when he was  admitted. He had a bump of his liver function studies. Alkaline  phosphatase went 149, SGOT 93, SGPT 180, and bilirubin at 2.3.   He did have a sonogram, as mentioned, with the results above, and a  hepatobiliary scan which showed no leak or obstruction.  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  He has also had this recent episode of a mini stroke, and he  is also a type 1 diabetic.   I saw him in consultation today. His temperature is 97.8; his blood  pressure is 109/69, heart rate 83, respirations 20 per minute, and his  O2 saturation  96%.  I have reviewed his chart. In essence, clinically  he feels fine. He has no change in his neurologic status.  His abdomen  is completely  benign. He has no guarding, no tenderness, no masses.  His  wound from his previous surgery is completely healed. He has no CVA  tenderness.  Bowel sounds are normoactive, and rectal examination is  guaiac-negative stool.  Clinically, the patient feels much better.  He  has no nausea or vomiting, and he is hungry.   I have discussed this case with the GI consultant, and he and I agree  that the presence of air may represent late abscess formation despite  the fact that he was adequately drained postoperatively. He certainly  does not have any bile leak, and the patient has been scheduled for a  drainage procedure by the invasive radiology department.  I am a little  worried about this as this patient has a derth of  RUQ symptom; however,  this may prove to be diagnostic in itself.  He certainly had  considerable inflammation 2 weeks ago when he had his cholecystectomy.   At the present time, I see no surgical problem.  His belly is completely  soft, as mentioned above, without any signs of a inflammation.  He has  been started on  Unasyn and has been doing well.  His blood sugars have  been in the 180 mg/dL, and he has been very stable here.  I will follow  him from the surgical point of view. Hopefully he will not have any  mishaps from invasive radiology procedure as I certainly would not want  to operate on this patient with such a recent history of stroke, etc.  We will follow him with the hospitalist service and the GI serviceand I  will discuss this with the interventional radiologist.      Felicie Morn, M.D.  Electronically Signed     WB/MEDQ  D:  02/26/2007  T:  02/26/2007  Job:  [id number]   cc:   R. Garfield Cornea, M.D.  [address]  [place]  [place] [address]

## 2011-03-19 NOTE — Discharge Summary (Signed)
NAMEGARNET, ZAMORSKI NO.:  [id number]   MEDICAL RECORD NO.:  [id number]          PATIENT TYPE:  INP   LOCATION:  A210                          FACILITY:  [facility]   PHYSICIAN:  Salem Caster, DO    DATE OF BIRTH:  Nov 01, 1941   DATE OF ADMISSION:  02/17/2007  DATE OF DISCHARGE:  04/21/2008LH                               DISCHARGE SUMMARY   DISCHARGE DIAGNOSES:  1. Acute ischemia cerebrovascular accident.  2. Clostridium difficile.  3. Insulin-dependent diabetes.  4. Coronary artery disease.  5. Hypertension.  6. Hyperlipidemia.   BRIEF HOSPITAL COURSE:  This is a 70 year old African-American male who  was discharged from [facility] secondary to a cholecystectomy  due to his cholecystitis.  The patient returns complaining of left-sided  facial numbness and weakness.  According to his wife, the patient was  fatigued and complaining of some left-sided facial numbness and  weakness.  Wife states that at some point, the patient could not walk at  all, and she decided to bring him to the hospital for evaluation.  The  patient was seen in the emergency room.  The patient did have a CT of  his head which showed (1) focus of hypoattenuation of left middle  cerebral peduncle, most likely ischemic.  It did show some calcification  of superior cerebellar vermis, (2) remote lacunar infarction in the  right thalamus, (3) scattered periventricular white matter changes,  likely reflective of sequelae of chronic microvascular ischemia.  The  patient also had a carotid Doppler of his carotid arteries which showed  minimal plaque formation bilaterally.  No evidence of significant  stenosis.  The patient did have a consult from neurology.  He did have  an MRA and MRI of the brain which showed acute infarction involving the  left cerebellar peduncle and likely affecting the left fifth nerve  pathway.  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  MRA was unremarkable in the circle of Willis and  showed a patent basilar artery.  The patient's left-sided facial  numbness and weakness has improved.  The patient has been receiving  physical therapy while an inpatient.  This will continue as an  outpatient.  The patient was started off with home health PT, and this  will be transitioned to outpatient PT.  The patient was started on  aspirin 325 mg daily upon admission; this will be continued.  The  patient was on sliding scale insulin while an inpatient.  His patient  will be continued, Lantus, as well as his oral hypoglycemics.  The  patient is stable and is eagerly awaiting discharge.   VITALS ON DISCHARGE:  Temperature 98, pulse 79, respirations 20, blood  pressure 105/66.   LABORATORIES ON DISCHARGE:  White blood cell count 7.6, hemoglobin 12.4,  hematocrit 36.7, platelets 389.  Sodium 139, potassium 4.5, chloride  102, CO2 of 29, glucose 231, BUN 9, creatinine 0.94.   MEDICATIONS ON DISCHARGE:  1. Omeprazole 20 mg daily.  2. Clonazepam 0.25 mg daily.  3. Lisinopril 5 mg daily.  4. Metformin 1000 mg b.i.d.  5. Aspirin 325 mg daily.  6. Simvastatin 40 mg q.h.s.  7. Metoprolol 25 mg b.i.d.  8. Lantus 10 units subcutaneous daily.   DISPOSITION:  The patient will be discharged to home.   CONDITION AT DISCHARGE:  Stable.   DIET:  Will start him on a heart healthy diet.   ACTIVITY:  The patient is to increase activity as tolerated with home  health PT assistance.   FOLLOWUP:  1. The patient is to follow up with the [facility], which I believe is his      primary care physician's office in the next 7-10 days.  2. The patient is instructed to follow up with his primary care      physician or back to the emergency room if any symptoms or are out      of the ordinary.   APPROXIMATE TIME FOR THIS DISCHARGE:  Approximately 35 minutes.      Salem Caster, DO  Electronically Signed     SM/MEDQ  D:  02/20/2007  T:   02/20/2007  Job:  [id number]

## 2011-03-19 NOTE — H&P (Signed)
[name], [name] NO.:  [id number]   MEDICAL RECORD NO.:  [id number]          PATIENT TYPE:  INP   LOCATION:  A316                          [facility]AC[facility]   PHYSICIAN:  Salem Caster, DO    DATE OF BIRTH:  1941/10/05   DATE OF ADMISSION:  02/01/2007  DATE OF DISCHARGE:  LH                              HISTORY & PHYSICAL   The patient's primary care physician is unassigned.  The patient does go  to the [facility] in [place], [place].   CHIEF COMPLAINT:  Right lower quadrant pain.   HISTORY OF PRESENT ILLNESS:  [name] is  a 70 year old African-  American male who presents with right lower quadrant abdominal pain.  The patient states he had severe abdominal pain that started yesterday  and was gradual in onset but was severe.  He states that the pain has  been waxing and waning.  The patient states that the pain at its worst  was a 10/10.  It has no radiation and stays in the right lower quadrant  area.  It is a sharp pain.  The patient denies any constipation,  diarrhea, fever, nausea or vomiting.  The patient does state that  approximately a year ago he was being seen at the [facility] in [place] and  was found to have cholecystitis and they wanted to do surgery to remove  his gallbladder.  The patient states that while in the process of  possibly having surgery, he had a heart evaluation and was found to have  a heart blockage.  The patient subsequently went on to have a coronary  artery bypass and never had cholecystectomy performed.  The patient  states after his coronary bypass the pain did subside and he has not had  any problems until recently.  The patient states at the time that after  surgery his diet did improve and as the months hae progressed, the  patient has started to eat more fatty foods and thinks this has  contributed to his return of his abdominal pain in his right lower  quadrant.   REVIEW OF SYSTEMS:  GENERAL:  Denies any weight  loss, fever or chills.  HEENT:  Denies any photophobia, visual problems, ear pain, nasal  problems or throat problems.  CARDIOVASCULAR:  Denies any chest pain.  RESPIRATORY:  Denies any wheezing or dyspnea.  He does have a slight dry  cough.  GASTROINTESTINAL:  Positive for right lower quadrant pain.  Denies any nausea or vomiting.  GENITOURINARY:  Denies any dysuria,  urgency or frequency.  MUSCULOSKELETAL:  Denies any arthritic pain, back  pain, neck pain.  NEUROLOGIC:  He does not have any weakness or  dizziness.   PAST MEDICAL HISTORY:  1. Positive for coronary artery bypass x4.  2. Positive for insulin-dependent diabetes.  3. Hypertension.  4. Hyperlipidemia.  5. Depression.  6. GERD.   SURGICAL HISTORY:  Open heart surgery, bypass x4, and history of a cyst  removal from the thyroid.   SOCIAL HISTORY:  He denies any smoking, alcohol or drug abuse.  ALLERGIES:  No known drug allergies.   HOME MEDICATIONS:  1. Omeprazole 20 mg once a day.  2. Clonazepam 0.25 mg p.r.n.  3. Lisinopril 5 mg once a day.  4. Metformin 1000 mg twice a day.  5. Aspirin 325 mg once a day.  6. Simvastatin 40 mg at bedtime.  7. Metoprolol 25 mg b.i.d.  8. Citalopram 20 mg daily.  9. The patient states he is on Humulin N 15 units in the morning and      30 units in the p.m.   PHYSICAL EXAMINATION:  VITAL SIGNS:  Temperature is 97.4, pulse 64,  respirations 20, blood pressure 144/73.  HEENT:  Head is atraumatic, normocephalic.  Eyes are PERRLA, EOMI.  No  scleral icterus noted.  NECK:  No JVD.  No thyromegaly appreciated.  The neck was supple and  nontender.  CARDIOVASCULAR:  Regular rate and rhythm.  No murmurs, gallops or rubs.  RESPIRATORY:  Breath sounds are clear to auscultation bilaterally.  No  rales, rhonchi or wheezing.  ABDOMEN:  Positive right upper quadrant pain with radiation to the right  lower quadrant.  No rigidity or guarding.  Abdomen was nondistended,  positive bowel sounds  appreciated.  EXTREMITIES:  No clubbing, cyanosis, or edema.  Does have remnants of  vessel removed for his coronary artery bypass present.   LABORATORY DATA:  White blood cell count is 5.7, hemoglobin 12.4,  hematocrit 36.6, platelets 344.  Sodium 132, potassium 4.3, chloride 96,  CO2 is 29, glucose 337, BUN 7, creatinine 0.84, total bilirubin is 1.8,  alkaline phosphatase 170, AST 173, ALT 243.  Amylase 71, lipase 32.  Urine is unremarkable.   IMPRESSION:  1. Right upper quadrant pain.  2. Acute cholecystitis.  3. Insulin-dependent diabetes.  4. Hypertension.  5. Elevated liver enzymes.  6. Hyponatremia.  7. Hyperglycemia.   PLAN:  1. For his right upper quadrant pain, general surgery has been      consulted and has seen the patient.  It is recommended that the      patient have a cardiology as well as a GI consult secondary to his      history of coronary artery disease and bypass surgery as well as      his elevated bilirubin and see if he may need a preop ERCP.  I will      maintain the patient on IV pain medications as well as antiemetics      as necessary.  2. For his acute cholecystitis the patient did have an abdominal      ultrasound which showed a significant thickened gallbladder wall      with sludge and pericholecystic fluid, likely representing acute      cholecystitis, but no definite shadowing of calculi seen though      small stones are not excluded.  They also show mild fatty      infiltration of the liver and a tiny left renal cyst.  Further      recommendations by surgery will follow.  3. For his insulin-dependent diabetes, the patient will be placed on a      sliding scale as well as kept on his metformin 1000 mg b.i.d.  4. Hypertension.  The patient will be maintained on his home      medications.  5. For his elevated liver enzymes, the patient will get a GI consult.     The patient may need a possible ERCP.  Will  repeat in the a.m.  6. For his  hyponatremia, I think this means some slight dehydration.      The patient will be maintained on IV fluids.  The patient was on D5      half normal and we will change this to 0.9 normal saline.      Salem Caster, DO  Electronically Signed     SM/MEDQ  D:  02/02/2007  T:  02/02/2007  Job:  [id number]   cc:   [facility]   GI Service   Felicie Morn, M.D.  Fax: [phone]

## 2011-03-19 NOTE — Group Therapy Note (Signed)
NAMEPINK, MOREIN NO.:  [id number]   MEDICAL RECORD NO.:  [id number]          PATIENT TYPE:  INP   LOCATION:  A316                          FACILITY:  [facility]   PHYSICIAN:  Audria Nine, M.D.DATE OF BIRTH:  1941/09/18   DATE OF PROCEDURE:  02/04/2007  DATE OF DISCHARGE:                                 PROGRESS NOTE   SUBJECTIVE:  The patient feels better.  Says right upper quadrant pain  is  improved.  We appreciate continued surgical evaluation and also  cardiology input.  He denies any chest pain or shortness of breath.  The  patient is looking forward to the surgery and going home.   OBJECTIVE:  Conscious, alert, comfortable, not in acute distress.  VITAL SIGNS:  Blood pressure 142/82, pulse of 68, respirations 18,  temperature 98.1.  Blood sugar are ranging between 175-254.  HEENT:  Normocephalic, atraumatic.  Oral mucosa was moist.  No exudates.  NECK:  Supple.  No JVD.  No lymphadenopathy.  LUNGS:  Clear and equal, good air entry bilaterally.  HEART:  S1, S2 regular.  No S3 gallops or rubs.  ABDOMEN:  Soft.  Mild tenderness in the right upper quadrant.  Otherwise, was unremarkable.  Bowel sounds positive.  EXTREMITIES:  No pitting pedal edema.   LABORATORY/DIAGNOSTIC DATA:  His white blood cell count is 6.4,  hemoglobin 11.9, hematocrit 35.5, platelet count normal at 363,000.  Sodium was 139, potassium 4.4, chloride 108, CO2 27, glucose was 242.  His liver function test is improved with an AST of 51, ALT of 145, and  direct bilirubin of 0.5.  This is the lowest the numbers have been since  admission.   ASSESSMENT AND PLAN:  Mr. Broers is a 70 year old African-American male  who presented with acute cholecystitis.  The patient is currently  awaiting surgery.  He remains hemodynamically stable.  He is currently  on ceftriaxone.  However, if his white count worsens or if he develops a  fever, I think it would be reasonable to switch him to either  Unasyn or  Zosyn.  He overall remains stable, and we await final input by surgery.  His liver function test shows improvement.      Audria Nine, M.D.  Electronically Signed     AM/MEDQ  D:  02/04/2007  T:  02/04/2007  Job:  [id number]

## 2011-03-19 NOTE — Discharge Summary (Signed)
[name], [name] NO.:  [id number]   MEDICAL RECORD NO.:  [id number]                   PATIENT TYPE:  INP   LOCATION:  A213                                 FACILITY:  [facility]   PHYSICIAN:  Karlyn Agee, M.D.              DATE OF BIRTH:  11/25/1940   DATE OF ADMISSION:  01/27/2004  DATE OF DISCHARGE:  01/29/2004                                 DISCHARGE SUMMARY   PRIMARY CARE PHYSICIAN:  Tesfaye D. Legrand Rams, M.D.   DISCHARGE DIAGNOSES:  1. Acute gastroenteritis, resolved.  2. Dehydration resolved.  3. Orthostatic syncope secondary to #1 and #2.  4. Diabetes mellitus uncontrolled.  5. Morbid obesity.  6. Hyperlipidemia.   DISPOSITION:  Discharged to home.   DISCHARGE CONDITION:  Stable.   DISCHARGE MEDICATIONS:  1. Insulin 70/30; 25 units each morning, 20 units each evening.  2. Glucophage XR 1000 mg twice daily.  3. Avandia 8 mg daily.  4. Lipitor 20 mg each evening.   HOSPITAL COURSE:  Please refer to history and physical of March 28.  This is  a morbidly obese, African-American man who had been having diarrhea and  vomiting from the night before admission, went to work that day but was  feeling very ill, did not have breakfast and at lunch time suddenly  collapsed in the cafeteria.  The patient felt faint coming on; and, after  fainting he could hear the voices around him.  The patient was admitted for  dehydration and evaluation.  The patient responded rapidly to clear liquids  and then a graduated diet, IV fluids and it is to be noted that there was  some confusion with the patient's medications.  The patient was taking only  insulin, but later on he told us that he was supposed to be taking also  Glucophage and Avandia, but was not taking them because he had not been  working and had not been able to afford them; however, he is now working.  The patient's blood pressure, therefore, was relatively uncontrolled while  in the hospital.   The patient is being discharged on the full doses of  medication.  He says that he has been advised to watch out for signs of low  blood sugar, to check his blood sugars regularly before each meal and at  bedtime for at least 2 weeks and to take these records to his primary care  physician Dr. Legrand Rams.     ___________________________________________                                         Karlyn Agee, M.D.   LC/MEDQ  D:  02/03/2004  T:  02/04/2004  Job:  [id number]   cc:   Tesfaye D. Legrand Rams, M.D.  [place]  [place] [address]  Fax: [phone]

## 2011-03-19 NOTE — Consult Note (Signed)
NAME:  [name], [name] NO.:  [id number]   MEDICAL RECORD NO.:  [id number]          PATIENT TYPE:  INP   LOCATION:  A316                          FACILITY:  [facility]   PHYSICIAN:  Felicie Morn, M.D. DATE OF BIRTH:  1941-03-15   DATE OF CONSULTATION:  02/02/2007  DATE OF DISCHARGE:                                 CONSULTATION   NOTE:  Surgery was asked to see this 70 year old black male for  cholecystitis.   CHIEF COMPLAINT:  Right upper quadrant pain and nausea.   HISTORY OF PRESENT MEDICAL ILLNESS:  The patient states he has roughly  had approximately a 2-day history of some vague right upper quadrant  pain that worsened yesterday causing him to go to the emergency room.  He was seen after hours.  A CT scan was performed and this showed  changes consistent with acute cholecystitis and cholelithiasis with  sludge noted in the gallbladder.  He was admitted to the hospital.  Sonogram today revealed the same findings.  He also had liver function  studies performed and they appeared to be improving somewhat.  Alkaline  phosphatase is 170, SGOT is 173, SGPT is 243.  Amylase and lipase are  not elevated.  His bilirubin is somewhat elevated from 1.2-1.8.   Surgery was consulted.   PHYSICAL EXAMINATION:  VITAL SIGNS:  He weighs approximately 230 pounds  and he is 5 feet 8 or 9 inches tall.  His temperature is 97.4, blood  pressure 144/73, respirations 20, heart rate 64 per minute.  HEENT:  Head is normocephalic.  Eyes-extraocular movements are intact.  Pupils are round and reactive to light and accommodation.  There is no  conjunctival pallor.  No sclera injection.  There is no icteric  tincture.  Nose and the oral mucosa are moist.  NECK:  Without jugular vein distention, thyromegaly, tracheal deviation,  or cervical adenopathy.  I do not palpate any thyromegaly.  CHEST:  Clear, both anterior and posterior auscultation.  He has a  midline mid sternal incision  consistent with his previous coronary  artery bypass surgery.  Heart sounds are distant although this patient  is rather large.  Lung fields appear to be clear.  ABDOMEN:  The patient is tender with guarding over the right upper  quadrant.  No masses are palpated.  Bowel sounds are present.  He has no  femoral or inguinal hernias.  RECTAL EXAMINATION:  The prostate is smooth and stool is guaiac-  negative.  EXTREMITIES:  Grossly within normal limits.  The patient has had a  saphenous vein harvested from his lower extremities for his coronary  artery bypass surgery.   REVIEW OF SYSTEMS:  CARDIORESPIRATORY SYSTEM:  The patient is a  nonsmoker.  He has quit smoking for a year since following his coronary  artery bypass last year in July 2007 at which time he had a coronary  artery bypass surgery in [place] x4.  He  has stopped smoking since  then however.  He has hypertension.  He has no previous history of  myocardial infarction.  ENDOCRINE SYSTEM:  The patient has had a benign growth removed from a  lobe of his thyroid.  He had a unilateral lobectomy of his thyroid  performed years ago.  Other endocrine problems are diabetes mellitus  type 1.  GU SYSTEM:  The patient has had nephrolithiasis, one episode in the past.  He has no  dysuria at present.  He has noted darker urine since his admission,  however.  MUSCULOSKELETAL SYSTEM:  Slightly obese, otherwise grossly  within normal limits.  NEUROLOGIC SYSTEM:  No history of seizures or  migraines and grossly his neurologic exam is intact.   MEDICATIONS:  See medication list on the chart.   ALLERGIES:  He has no known allergies.   IMPRESSION:  Acute cholecystitis secondary to cholelithiasis, mild  elevated bilirubin at 1.8 total with diminishing liver function studies  as mentioned above.  His white count is 5.7 with 82% neutrophils noted  with an hemoglobin and hematocrit at 12.4 and 36.6.  His electrolytes  are grossly within normal  limits.  His blood sugar earlier is still in  the low 300s.   OTHER MEDICAL PROBLEMS/SECONDARY DIAGNOSES:  1. Coronary artery disease status post coronary artery bypass x4 one      year ago in July 2007.  2. Diabetes mellitus type 1 at present needs further control.  3. Hypertension.   PLAN:  The patient would need cholecystectomy during this hospital  admission.  However, he should be seen by Cardiology preoperatively and  also I think it would be of value to have the GI service see him  regarding the increasing bilirubin to see if perhaps an ERCP needs to be  done prior to cholecystectomy.  Obviously, the patient's blood sugar  needs to be better controlled at present.   I have discussed the surgery with this patient also given him the option  that if he cares to go to the [facility] where he had his coronary  artery bypass surgery, he is considering that.  We told him that we  could certainly operate on him here if he wished.  He needed to be seen  obviously by the cardiology service and GI service preoperatively.   We discussed the surgery with him discussing complications not limited  to but including bleeding, infection, damage to bile ducts, perforation  of organs and transitory diarrhea.  Informed consent was obtained by the  patient and his wife and all questions were answered.   I will follow closely with the medical service.      Felicie Morn, M.D.  Electronically Signed     WB/MEDQ  D:  02/02/2007  T:  02/02/2007  Job:  [id number]   cc:   [facility]   [facility]   GI SERVICE

## 2011-03-19 NOTE — Consult Note (Signed)
NAME:  [name], [name].:   MEDICAL RECORD NO.:  [id number]                   PATIENT TYPE:   LOCATION:                                       FACILITY:   PHYSICIAN:  Cristopher Estimable. Rourk, M.D.               DATE OF BIRTH:  [birth year]   DATE OF CONSULTATION:  DATE OF DISCHARGE:                           GASTROENTEROLOGY CONSULTATION   CONSULTATION FOR CANCER SCREENING:   REASON FOR REFERRAL:  Need for colorectal cancer screening.   REFERRING PHYSICIAN:  Tesfaye D. Legrand Rams, M.D.   HISTORY OF PRESENT ILLNESS:  This is a pleasant, 70 year old gentleman  devoid of any GI tract symptoms. He is somewhat overdue for colorectal  cancer screening.  He has never had colonoscopy or other studies of his  lower GI tract.  He does not have any melena or rectal bleeding; or any  change in bowel habits; no abdominal pain, no change in weight.  There is no  family history of colorectal neoplasia.  He does not have any upper GI tract  symptoms such as odynophagia, dysphagia, early satiety, reflux symptoms,  nausea or vomiting.   PAST MEDICAL HISTORY:  Significant for insulin-dependent diabetes mellitus.   PAST SURGICAL HISTORY:  Thyroidectomy.   CURRENT MEDICATIONS:  Insulin 70/30, 25 units in the morning and 70/30, 15  units in the evening; Glucophage 1000 mg b.i.d.; Avandia 4 mg b.i.d.   ALLERGIES:  Aspirin.   FAMILY HISTORY:  Negative for chronic GI or liver disease.   SOCIAL HISTORY:  The patient has been married for 9 years has 5 children,  employed by [employer].  Does not smoke and does not use tobacco.   REVIEW OF SYSTEMS:  No chest pain or dyspnea on exertion.  No fever, chills,  change in weight.   PHYSICAL EXAMINATION:   GENERAL:  Physical examination reveals a pleasant 70 year old gentleman  resting comfortably.   VITAL SIGNS:  Weight 258.  Height 5 feet 9 inches.  Temperature 98.5, BP  122/70, pulse 84.   SKIN:  Warm and dry.   HEENT:  No scleral icterus.   NECK:  JVD is not prominent.   CHEST:  Lungs are clear to auscultation.   CARDIAC:  Regular rate and rhythm without murmur, rub, or gallop.   ABDOMEN:  Nondistended, obese, positive bowel sounds, soft and nontender  without appreciable mass or organomegaly.   EXTREMITIES:  No edema.   IMPRESSION:  This is a 70 year old gentleman devoid of any gastrointestinal  symptoms.  He is in need of colorectal cancer screening.  I have discussed  the approach of colonoscopy with the patient.  The potential risks,  benefits, and alternatives have been reviewed, questions answered.  I feel  he is low risk for conscious sedation in the way of Versed and Demerol.   PLAN:  Colonoscopy in the very near future.  I would like to thank Dr.  Brandon Melnick  Fanta for allowing me to see this nice gentleman today.                                               Cristopher Estimable. Rourk, M.D.    RMR/MEDQ  D:  06/19/2002  T:  06/19/2002  Job:  [id number]   cc:   Tesfaye D. Legrand Rams, M.D.

## 2011-03-19 NOTE — Op Note (Signed)
NAMELEILAND, QUIGGLE NO.:  [id number]   MEDICAL RECORD NO.:  [id number]                   PATIENT TYPE:  AMB   LOCATION:  DAY                                  FACILITY:  [facility]   PHYSICIAN:  Cristopher Estimable. Rourk, M.D.               DATE OF BIRTH:  08/31/1941   DATE OF PROCEDURE:  06/28/2002  DATE OF DISCHARGE:                                 OPERATIVE REPORT   PROCEDURE:  Screening colonoscopy.   ENDOSCOPIST:  Cristopher Estimable. Rourk, M.D.   INDICATIONS FOR PROCEDURE:  The patient is a 69 year old gentleman, followed  primarily by Dr. Legrand Rams who has sent him in for colorectal cancer screening.  He is devoid of any GI symptoms.  No family history of colorectal neoplasia.  No prior imaging of his lower GI tract has been performed.   Colonoscopy is now being done as a standard screening maneuver.  This  approach has been discussed with the patient at length previously; and,  again, today at the bedside.  The potential risks, benefits, and  alternatives have been reviewed and questions answered I feel that he is low  risk for conscious sedation in the way of Versed and Demerol.  Please see my  dictated H&P for more information.   DESCRIPTION OF PROCEDURE:  O2 saturation, blood pressure, pulse and  respirations were monitored throughout the entire procedure.   CONSCIOUS SEDATION:  Versed 4 mg IV Demerol 75 mg in divided doses.   INSTRUMENT:  Olympus video chip colonoscope.   FINDINGS:  Digital rectal exam revealed no abnormalities.   ENDOSCOPIC FINDINGS:  The prep was good.   RECTUM:  Examination of the rectal mucosa including the retroflex view of  the anal verge revealed no abnormalities.   COLON:  The colonic mucosa was surveyed from the rectosigmoid junction  through the left transverse and right colon to the area of the appendiceal  orifice, ileocecal valve, and cecum.  The colonic mucosa was well seen the  cecum and the only abnormality noted was a 3  mm polyp in the cecum which was  cold biopsied/removed.   From this level the scope was slowly withdrawn, and all previously mentioned  mucosal surfaces were again seen; and, again, no abnormalities were  observed.   The patient tolerated the procedure well and was reacted in endoscopy.   IMPRESSION:  1. Normal rectum.  2. Diminutive polyp of the cecum cold biopsied/removed.  3. The remainder of the colonic mucosa appeared normal.   RECOMMENDATIONS:  1. Follow up on path.  2. Further recommendations to follow.                                                Cristopher Estimable. Rourk, M.D.  RMR/MEDQ  D:  06/28/2002  T:  06/29/2002  Job:  [id number]   cc:   Tesfaye D. Legrand Rams, M.D.

## 2011-05-18 ENCOUNTER — Encounter: Payer: Self-pay | Admitting: *Deleted

## 2011-05-18 ENCOUNTER — Emergency Department (HOSPITAL_COMMUNITY): Payer: PRIVATE HEALTH INSURANCE

## 2011-05-18 ENCOUNTER — Emergency Department (HOSPITAL_COMMUNITY)
Admission: EM | Admit: 2011-05-18 | Discharge: 2011-05-18 | Disposition: A | Discharge status: Home / Self Care | Payer: PRIVATE HEALTH INSURANCE | Attending: Emergency Medicine | Admitting: Emergency Medicine

## 2011-05-18 DIAGNOSIS — Z8673 Personal history of transient ischemic attack (TIA), and cerebral infarction without residual deficits: Secondary | ICD-10-CM | POA: Insufficient documentation

## 2011-05-18 DIAGNOSIS — H8110 Benign paroxysmal vertigo, unspecified ear: Secondary | ICD-10-CM | POA: Insufficient documentation

## 2011-05-18 DIAGNOSIS — E119 Type 2 diabetes mellitus without complications: Secondary | ICD-10-CM | POA: Insufficient documentation

## 2011-05-18 DIAGNOSIS — Z951 Presence of aortocoronary bypass graft: Secondary | ICD-10-CM | POA: Insufficient documentation

## 2011-05-18 DIAGNOSIS — Z87891 Personal history of nicotine dependence: Secondary | ICD-10-CM | POA: Insufficient documentation

## 2011-05-18 DIAGNOSIS — I10 Essential (primary) hypertension: Secondary | ICD-10-CM | POA: Insufficient documentation

## 2011-05-18 DIAGNOSIS — I2510 Atherosclerotic heart disease of native coronary artery without angina pectoris: Secondary | ICD-10-CM | POA: Insufficient documentation

## 2011-05-18 HISTORY — DX: Essential (primary) hypertension: I10

## 2011-05-18 HISTORY — DX: Atherosclerotic heart disease of native coronary artery without angina pectoris: I25.10

## 2011-05-18 HISTORY — DX: Enterocolitis due to Clostridium difficile, not specified as recurrent: A04.72

## 2011-05-18 HISTORY — DX: Cerebral infarction, unspecified: I63.9

## 2011-05-18 MED ORDER — MECLIZINE HCL 12.5 MG PO TABS
25.0000 mg | ORAL_TABLET | Freq: Once | ORAL | Status: AC
  Administered 2011-05-18: 25 mg via ORAL
  Filled 2011-05-18: qty 2

## 2011-05-18 MED ORDER — LORAZEPAM 1 MG PO TABS: 1.0000 mg | ORAL_TABLET | Freq: Three times a day (TID) | ORAL | Status: AC | PRN

## 2011-05-18 MED ORDER — LORAZEPAM 1 MG PO TABS
1.0000 mg | ORAL_TABLET | Freq: Once | ORAL | Status: AC
  Administered 2011-05-18: 1 mg via ORAL
  Filled 2011-05-18: qty 1

## 2011-05-18 MED ORDER — MECLIZINE HCL 25 MG PO TABS: 20.0000 mg | ORAL_TABLET | Freq: Four times a day (QID) | ORAL | Status: AC

## 2011-05-18 NOTE — ED Notes (Signed)
Patient denies pain and is resting comfortably.  

## 2011-05-18 NOTE — ED Provider Notes (Addendum)
History     Chief Complaint  Patient presents with  . Dizziness   HPI Comments: Pt c/o feeling dizzy today, described as unsteady and "swimmy-headed", worse with movement (sitting/standing up especially). Denies cp, sob, cough, congestion, f/c, or n/v. Pt h/o DM, HTN, and CABG in 2005; sugar and BP have been normal for patient.   Patient is a 70 y.o. male presenting with general illness. The history is provided by the patient.  Illness  The current episode started today. The onset was sudden (Pt was standing in the bathroom when suddenly felt unsteady and dizzy.). The problem occurs continuously. The problem has been unchanged. The problem is moderate. Relieved by: lying still. The symptoms are aggravated by movement. Pertinent negatives include no fever, no decreased vision, no double vision, no abdominal pain, no nausea, no vomiting, no ear pain, no headaches, no rhinorrhea, no neck pain, no cough, no URI and no rash.    Past Medical History  Diagnosis Date  . Hypertension   . Coronary artery disease   . Diabetes mellitus   . Stroke   . C. difficile diarrhea     Past Surgical History  Procedure Date  . Cholecystectomy   . Coronary artery bypass graft   . Thyroid surgery     Family History  Problem Relation Age of Onset  . Diabetes Mother   . Heart failure Mother   . Hypertension Mother   . Diabetes Father   . Heart failure Father   . Hypertension Father   . Diabetes Sister   . Heart failure Sister   . Hypertension Sister   . Diabetes Brother   . Heart failure Brother   . Hypertension Brother     History  Substance Use Topics  . Smoking status: Former Research scientist (life sciences)  . Smokeless tobacco: Not on file  . Alcohol Use: No      Review of Systems  Constitutional: Negative for fever.  HENT: Negative for ear pain, rhinorrhea and neck pain.   Eyes: Negative for double vision.  Respiratory: Negative for cough.   Gastrointestinal: Negative for nausea, vomiting and abdominal  pain.  Skin: Negative for rash.  Neurological: Negative for headaches.  All other systems reviewed and are negative.    Physical Exam  BP 137/60  Pulse 72  Temp(Src) 98.6 F (37 C) (Oral)  Resp 20  Ht 5\' 8"  (1.727 m)  Wt 282 lb (127.914 kg)  BMI 42.88 kg/m2  SpO2 99%  Physical Exam  Nursing note and vitals reviewed. Constitutional: He is oriented to person, place, and time. No distress.       Appearance consistent with age of record  HENT:  Head: Normocephalic and atraumatic.  Right Ear: External ear normal.  Left Ear: External ear normal.  Nose: Nose normal.  Mouth/Throat: Oropharynx is clear and moist.  Eyes: Conjunctivae are normal.  Neck: Neck supple.  Cardiovascular: Normal rate and regular rhythm.  Exam reveals no gallop and no friction rub.   No murmur heard. Pulmonary/Chest: Effort normal and breath sounds normal. He has no wheezes. He has no rhonchi. He has no rales. He exhibits no tenderness.  Abdominal: Soft. There is no tenderness.  Musculoskeletal: Normal range of motion. He exhibits no edema and no tenderness.       Normal appearance of extremities  Neurological: He is alert and oriented to person, place, and time. No sensory deficit.       Gait normal  Skin: Skin is warm and dry. No rash  noted.       Color normal  Psychiatric: He has a normal mood and affect.    ED Course  Procedures Written by Caryl Bis acting as scribe for Dr. Lacinda Axon.  MDM CT head to r/o mass; sx c/w vertigo, meds antivert and ativan  1905 -- Pt reeval: pt feels better after meds, discussed work up findings and plan for discharge with pt and family, they understand and agree with plan.  I personally performed the services described in this documentation, which was scribed in my presence. The recorded information has been reviewed and considered. No att. providers found   Nat Christen, MD 05/18/11 Joen Laura  Nat Christen, MD 06/18/11 1308

## 2011-05-18 NOTE — ED Notes (Signed)
Pt states dizziness started on Sunday night. Dizziness is worse when standing up. Denies pain.

## 2011-05-18 NOTE — ED Notes (Signed)
Pt states feels better after medication administration.

## 2011-05-18 NOTE — ED Notes (Signed)
MD at bedside. Dr Lacinda Axon at bedside, examined pt.  Questions asked and answered.

## 2011-05-18 NOTE — ED Notes (Signed)
Family at bedside. Wife at pt's side.

## 2011-05-18 NOTE — ED Notes (Signed)
Dizziness since Sunday. Worse with movement.

## 2011-05-19 ENCOUNTER — Observation Stay (HOSPITAL_COMMUNITY)
Admission: EM | Admit: 2011-05-19 | Discharge: 2011-05-20 | Disposition: A | Discharge status: Home / Self Care | Payer: Medicare Other | Attending: Internal Medicine | Admitting: Internal Medicine

## 2011-05-19 ENCOUNTER — Encounter (HOSPITAL_COMMUNITY): Payer: Self-pay | Admitting: *Deleted

## 2011-05-19 DIAGNOSIS — E1122 Type 2 diabetes mellitus with diabetic chronic kidney disease: Secondary | ICD-10-CM

## 2011-05-19 DIAGNOSIS — N181 Chronic kidney disease, stage 1: Secondary | ICD-10-CM | POA: Insufficient documentation

## 2011-05-19 DIAGNOSIS — I951 Orthostatic hypotension: Principal | ICD-10-CM | POA: Diagnosis present

## 2011-05-19 DIAGNOSIS — H8110 Benign paroxysmal vertigo, unspecified ear: Secondary | ICD-10-CM | POA: Insufficient documentation

## 2011-05-19 DIAGNOSIS — Z8673 Personal history of transient ischemic attack (TIA), and cerebral infarction without residual deficits: Secondary | ICD-10-CM | POA: Insufficient documentation

## 2011-05-19 DIAGNOSIS — R42 Dizziness and giddiness: Secondary | ICD-10-CM | POA: Diagnosis present

## 2011-05-19 DIAGNOSIS — E119 Type 2 diabetes mellitus without complications: Secondary | ICD-10-CM | POA: Insufficient documentation

## 2011-05-19 DIAGNOSIS — R110 Nausea: Secondary | ICD-10-CM | POA: Insufficient documentation

## 2011-05-19 DIAGNOSIS — I10 Essential (primary) hypertension: Secondary | ICD-10-CM | POA: Diagnosis present

## 2011-05-19 DIAGNOSIS — I6789 Other cerebrovascular disease: Secondary | ICD-10-CM | POA: Insufficient documentation

## 2011-05-19 DIAGNOSIS — I129 Hypertensive chronic kidney disease with stage 1 through stage 4 chronic kidney disease, or unspecified chronic kidney disease: Secondary | ICD-10-CM | POA: Insufficient documentation

## 2011-05-19 HISTORY — DX: Peripheral vascular disease, unspecified: I73.9

## 2011-05-19 LAB — BASIC METABOLIC PANEL
BUN: 18 mg/dL (ref 6–23)
CO2: 27 mEq/L (ref 19–32)
Calcium: 8.9 mg/dL (ref 8.4–10.5)
Chloride: 104 mEq/L (ref 96–112)
Creatinine, Ser: 1.05 mg/dL (ref 0.50–1.35)
GFR calc Af Amer: >60 mL/min (ref >60)
GFR calc non Af Amer: >60 mL/min (ref >60)
Glucose, Bld: 137 mg/dL — ABNORMAL HIGH (ref 70–99)
Potassium: 3.7 mEq/L (ref 3.5–5.1)
Sodium: 139 mEq/L (ref 135–145)

## 2011-05-19 LAB — DIFFERENTIAL
Basophils Absolute: 0 10*3/uL (ref 0.0–0.1)
Basophils Relative: 0 % (ref 0–1)
Eosinophils Absolute: 0.1 10*3/uL (ref 0.0–0.7)
Eosinophils Relative: 1 % (ref 0–5)
Lymphocytes Relative: 42 % (ref 12–46)
Lymphs Abs: 2.6 10*3/uL (ref 0.7–4.0)
Monocytes Absolute: 0.6 10*3/uL (ref 0.1–1.0)
Monocytes Relative: 10 % (ref 3–12)
Neutro Abs: 2.9 10*3/uL (ref 1.7–7.7)
Neutrophils Relative %: 47 % (ref 43–77)

## 2011-05-19 LAB — CBC
HCT: 39.7 % (ref 39.0–52.0)
Hemoglobin: 13.1 g/dL (ref 13.0–17.0)
MCH: 30.5 pg (ref 26.0–34.0)
MCHC: 33 g/dL (ref 30.0–36.0)
MCV: 92.5 fL (ref 78.0–100.0)
Platelets: 178 10*3/uL (ref 150–400)
RBC: 4.29 MIL/uL (ref 4.22–5.81)
RDW: 13.8 % (ref 11.5–15.5)
WBC: 6.1 10*3/uL (ref 4.0–10.5)

## 2011-05-19 MED ORDER — LORAZEPAM 1 MG PO TABS
1.0000 mg | ORAL_TABLET | Freq: Once | ORAL | Status: AC
  Administered 2011-05-19: 1 mg via ORAL
  Filled 2011-05-19: qty 1

## 2011-05-19 NOTE — ED Provider Notes (Signed)
History     Chief Complaint  Patient presents with  . Dizziness    pt c/o dizziness that is worse when he stands up. Pt seen here yesterday and was told he had vertigo. States the diddyness has gotten worse today.   HPI Comments: Pt c/o persistent dizziness/unsteadiness, worse with movement/sitting/standing up; pt has difficulty walking on his own. Pt seen in ED last night, no acute findings by CT head. Today pt also had a few episodes of "not making sense" when he spoke. Family reports pt had 2 mild strokes in the past. No numbness or focal weakness.   Patient is a 70 y.o. male presenting with neurologic complaint. The history is provided by the patient and a relative.  Neurologic Problem The primary symptoms include dizziness and speech change. Primary symptoms do not include headaches, loss of consciousness, focal weakness, loss of sensation, fever, nausea or vomiting. The symptoms began yesterday. The symptoms are worsening. The symptoms occurred after standing up (with movement).  Description: unsteadiness. The dizziness began yesterday. The dizziness has been gradually worsening since its onset. Dizziness does not occur with blurred vision, tinnitus, hearing loss, nausea, vomiting, weakness or diaphoresis.  Change in speech began 6 - 12 hours ago. Description of speech change: few intermittent episodes of "not talking right/not making sense"  Additional symptoms do not include neck stiffness, weakness, nystagmus or tinnitus. Medical issues also include cerebral vascular accident.    Past Medical History  Diagnosis Date  . Hypertension   . Coronary artery disease   . Diabetes mellitus   . Stroke   . C. difficile diarrhea     Past Surgical History  Procedure Date  . Cholecystectomy   . Coronary artery bypass graft   . Thyroid surgery     Family History  Problem Relation Age of Onset  . Diabetes Mother   . Heart failure Mother   . Hypertension Mother   . Diabetes Father   .  Heart failure Father   . Hypertension Father   . Diabetes Sister   . Heart failure Sister   . Hypertension Sister   . Diabetes Brother   . Heart failure Brother   . Hypertension Brother     History  Substance Use Topics  . Smoking status: Former Research scientist (life sciences)  . Smokeless tobacco: Not on file  . Alcohol Use: No      Review of Systems  Constitutional: Negative for fever, chills and diaphoresis.  HENT: Negative for neck pain, neck stiffness and tinnitus.   Eyes: Negative for blurred vision and pain.  Respiratory: Negative for shortness of breath.   Cardiovascular: Positive for chest pain.  Gastrointestinal: Negative for nausea, vomiting and abdominal pain.  Genitourinary: Negative for dysuria.  Musculoskeletal: Negative for back pain.  Skin: Negative for rash.  Neurological: Positive for dizziness and speech change. Negative for focal weakness, loss of consciousness, weakness and headaches.  All other systems reviewed and are negative.    Physical Exam  BP 136/56  Pulse 73  Temp(Src) 98.1 F (36.7 C) (Oral)  Resp 24  Ht 5\' 8"  (1.727 m)  Wt 280 lb (127.007 kg)  BMI 42.57 kg/m2  SpO2 96%  Physical Exam  Constitutional: He is oriented to person, place, and time. He appears well-developed and well-nourished.  HENT:  Head: Normocephalic and atraumatic.  Eyes: Conjunctivae and EOM are normal. Pupils are equal, round, and reactive to light. Right eye exhibits no nystagmus. Left eye exhibits no nystagmus.  Neck: Full passive range  of motion without pain. Neck supple. No thyromegaly present.       No meningismus  Cardiovascular: Normal rate, regular rhythm, S1 normal, S2 normal and intact distal pulses.   Pulmonary/Chest: Effort normal and breath sounds normal.  Abdominal: Soft. Bowel sounds are normal. There is no tenderness. There is no CVA tenderness.  Musculoskeletal: Normal range of motion.  Neurological: He is alert and oriented to person, place, and time. He has normal  strength and normal reflexes. He displays normal reflexes. No cranial nerve deficit or sensory deficit. He displays a negative Romberg sign. Coordination normal. GCS eye subscore is 4. GCS verbal subscore is 5. GCS motor subscore is 6.       Normal Gait; strength 5/5 bilateral upper and lower extremities; no facial droop; no nystagmus  Skin: Skin is warm and dry. No rash noted. No cyanosis. Nails show no clubbing.  Psychiatric: He has a normal mood and affect. His speech is normal and behavior is normal.    ED Course  Procedures  MDM  Dizzy unable to ambulate. Plan admit for MRI given h/o CVA. D/w hospitalist will admit tele  I personally performed the services described in this documentation, which was scribed in my presence. The recorded information has been reviewed and considered.    Written by Caryl Bis acting as scribe for Dr. Montez Morita.   Teressa Lower, MD 05/20/11 [phone]

## 2011-05-20 ENCOUNTER — Inpatient Hospital Stay (HOSPITAL_COMMUNITY): Payer: Medicare Other

## 2011-05-20 ENCOUNTER — Encounter (HOSPITAL_COMMUNITY): Payer: Self-pay | Admitting: *Deleted

## 2011-05-20 DIAGNOSIS — R42 Dizziness and giddiness: Secondary | ICD-10-CM | POA: Diagnosis present

## 2011-05-20 DIAGNOSIS — E1122 Type 2 diabetes mellitus with diabetic chronic kidney disease: Secondary | ICD-10-CM

## 2011-05-20 DIAGNOSIS — I10 Essential (primary) hypertension: Secondary | ICD-10-CM | POA: Diagnosis present

## 2011-05-20 DIAGNOSIS — I951 Orthostatic hypotension: Principal | ICD-10-CM | POA: Diagnosis present

## 2011-05-20 HISTORY — DX: Type 2 diabetes mellitus with diabetic chronic kidney disease: E11.22

## 2011-05-20 LAB — CARDIAC PANEL(CRET KIN+CKTOT+MB+TROPI)
CK, MB: 4 ng/mL (ref 0.3–4.0)
Relative Index: 0.9 (ref 0.0–2.5)
Total CK: 432 U/L — ABNORMAL HIGH (ref 7–232)
Troponin I: <0.3 ng/mL (ref <0.30)

## 2011-05-20 MED ORDER — CILOSTAZOL 100 MG PO TABS
50.0000 mg | ORAL_TABLET | Freq: Two times a day (BID) | ORAL | Status: DC
  Administered 2011-05-20: 50 mg via ORAL
  Filled 2011-05-20 (×3): qty 1

## 2011-05-20 MED ORDER — SPIRONOLACTONE 25 MG PO TABS
25.0000 mg | ORAL_TABLET | Freq: Every day | ORAL | Status: DC
  Administered 2011-05-20: 25 mg via ORAL
  Filled 2011-05-20: qty 1

## 2011-05-20 MED ORDER — LORAZEPAM 1 MG PO TABS: 1.0000 mg | ORAL_TABLET | Freq: Three times a day (TID) | ORAL | Status: DC | PRN

## 2011-05-20 MED ORDER — LOSARTAN POTASSIUM 50 MG PO TABS
100.0000 mg | ORAL_TABLET | Freq: Every day | ORAL | Status: DC
  Administered 2011-05-20: 100 mg via ORAL
  Filled 2011-05-20: qty 2

## 2011-05-20 MED ORDER — OXYBUTYNIN CHLORIDE 5 MG PO TABS
5.0000 mg | ORAL_TABLET | ORAL | Status: DC
  Administered 2011-05-20: 5 mg via ORAL
  Filled 2011-05-20: qty 1

## 2011-05-20 MED ORDER — ROSUVASTATIN CALCIUM 20 MG PO TABS: 20.0000 mg | ORAL_TABLET | Freq: Every day | ORAL | Status: DC

## 2011-05-20 MED ORDER — GADOBENATE DIMEGLUMINE 529 MG/ML IV SOLN
20.0000 mL | Freq: Once | INTRAVENOUS | Status: AC | PRN
  Administered 2011-05-20: 20 mL via INTRAVENOUS

## 2011-05-20 MED ORDER — PANTOPRAZOLE SODIUM 40 MG PO TBEC
40.0000 mg | DELAYED_RELEASE_TABLET | Freq: Every day | ORAL | Status: DC
  Administered 2011-05-20: 40 mg via ORAL
  Filled 2011-05-20: qty 1

## 2011-05-20 MED ORDER — METOPROLOL TARTRATE 50 MG PO TABS: 25.0000 mg | ORAL_TABLET | Freq: Two times a day (BID) | ORAL | Status: DC

## 2011-05-20 MED ORDER — PIOGLITAZONE HCL 30 MG PO TABS
30.0000 mg | ORAL_TABLET | Freq: Every day | ORAL | Status: DC
  Administered 2011-05-20: 30 mg via ORAL
  Filled 2011-05-20: qty 1

## 2011-05-20 MED ORDER — SODIUM CHLORIDE 0.9 % IJ SOLN
3.0000 mL | Freq: Two times a day (BID) | INTRAMUSCULAR | Status: DC
  Administered 2011-05-20 (×2): 3 mL via INTRAVENOUS
  Filled 2011-05-20 (×2): qty 3

## 2011-05-20 MED ORDER — MECLIZINE HCL 12.5 MG PO TABS
25.0000 mg | ORAL_TABLET | Freq: Four times a day (QID) | ORAL | Status: DC
  Administered 2011-05-20: 25 mg via ORAL
  Filled 2011-05-20: qty 2
  Filled 2011-05-20 (×6): qty 1

## 2011-05-20 MED ORDER — SODIUM CHLORIDE 0.9 % IJ SOLN: 3.0000 mL | INTRAMUSCULAR | Status: DC | PRN

## 2011-05-20 MED ORDER — METOPROLOL TARTRATE 50 MG PO TABS
50.0000 mg | ORAL_TABLET | Freq: Two times a day (BID) | ORAL | Status: DC
  Administered 2011-05-20 (×2): 50 mg via ORAL
  Filled 2011-05-20 (×2): qty 1

## 2011-05-20 MED ORDER — ASPIRIN-DIPYRIDAMOLE ER 25-200 MG PO CP12
1.0000 | ORAL_CAPSULE | Freq: Two times a day (BID) | ORAL | Status: DC
  Administered 2011-05-20: 1 via ORAL
  Filled 2011-05-20 (×4): qty 1

## 2011-05-20 MED ORDER — MECLIZINE HCL 12.5 MG PO TABS
25.0000 mg | ORAL_TABLET | Freq: Once | ORAL | Status: AC
Start: 2011-05-20 — End: 2011-05-20
  Administered 2011-05-20: 25 mg via ORAL
  Filled 2011-05-20: qty 2

## 2011-05-20 NOTE — Discharge Summary (Signed)
Physician Discharge Summary  Patient ID: [name] MRN: [id number] DOB/AGE: 1940/11/22 70 y.o.  Admit date: 05/19/2011 Discharge date: 05/20/2011  Primary Care Physician:  Telford Nab of [facility]   Discharge Diagnoses:    Patient Active Problem List  Diagnoses  . Orthostatic hypotension  . 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 Dizziness - light-headed  . HTN (hypertension)    Current Discharge Medication List    CONTINUE these medications which have CHANGED   Details  metoprolol (LOPRESSOR) 50 MG tablet Take 0.5 tablets (25 mg total) by mouth 2 (two) times daily. FOR HEART/BLOOD PRESSURE. HOLD FOR SYSTOLIC BLOOD PRESSURE LESS THAN 1OO/HEART RATE LESS THAN 60      CONTINUE these medications which have NOT CHANGED   Details  cilostazol (PLETAL) 50 MG tablet Take 50 mg by mouth 2 (two) times daily. FOR BLOOD FLOW (PERIPHERALVASCULAR DISEASE) TAKE 30 MINUTES BEFORE OR 2 HOURS AFTER A MEAL     dipyridamole-aspirin (AGGRENOX) 25-200 MG per 12 hr capsule Take 1 capsule by mouth 2 (two) times daily.      LORazepam (ATIVAN) 1 MG tablet Take 1 tablet (1 mg total) by mouth 3 (three) times daily as needed (dizziness). Qty: 20 tablet, Refills: 0    losartan (COZAAR) 100 MG tablet Take 100 mg by mouth daily.      meclizine (ANTIVERT) 25 MG tablet Take 1 tablet (25 mg total) by mouth 4 (four) times daily. Qty: 28 tablet, Refills: 0    metFORMIN (GLUCOPHAGE) 1000 MG tablet Take 1,000 mg by mouth 2 (two) times daily with a meal.      omeprazole (PRILOSEC) 20 MG capsule Take 20 mg by mouth daily.      oxybutynin (DITROPAN) 5 MG tablet Take 5 mg by mouth daily.      pioglitazone (ACTOS) 30 MG tablet Take 30 mg by mouth daily.     rosuvastatin (CRESTOR) 40 MG tablet Take 20 mg by mouth at bedtime.      spironolactone (ALDACTONE) 25 MG tablet Take 25 mg by mouth daily.           Disposition and Follow-up: Disposition: Improved Discharge diet:  Heart healthy carb modified Activity: Slow to increase Followup: With PCP as scheduled in September, patient and family are advised to make an earlier appointment if blood pressure starts to significantly stay elevated for systolic greater than Q000111Q.   Consults:  none    Significant Diagnostic Studies:  CT and MRI of the head both confirmed no evidence of any acute CVA.     Hospital Course:  Principal Problem:  *Dizziness - light-headed- felt to be secondary to orthostatic hypotension on my medication.  Active Problems:  Orthostatic hypotension-we'll patient is a 70 year old African American male with past medical history of diabetes and hypertension as well as peripheral vascular disease and old stroke who presented with complaints of dizziness and lightheadedness going on for the past 2-3 days.  The patient specifically describes this as a feeling of lightheadedness worse when standing and denies any symptoms of room-spinning-like symptoms. Patient also denies any recent episodes of dehydration which might include vomiting or diarrhea or spending some time out in the hot sun. After review of the patient's medications was likely felt that he had orthostatic hypotension brought on by his blood pressure agents his blood pressure he says is well controlled and so we decreased his atenolol to 25 twice a day from previous 50 twice a day. His ARB and  diuretic were left unchanged he diuretic was a mild dose. Patient's CT MRI confirmed no evidence of CVA. I've advised the patient to monitor blood pressure closely and should his blood pressure starts remain persistently elevated with a systolic above Q000111Q, to follow up with his primary care provider at the [place].   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  HTN (hypertension): Encourage diet and exercise. See medication change as above.    SignedAnnita Brod 05/20/2011,  3:46 PM

## 2011-05-20 NOTE — H&P (Signed)
  Job [id number]

## 2011-05-20 NOTE — Progress Notes (Signed)
Reviewed d/c instructions with client and family, pt discharged to home

## 2011-05-20 NOTE — H&P (Signed)
NAMEPOSEY, EDDINGER NO.:  [id number]  MEDICAL RECORD NO.:  [id number]  LOCATION:  A306                          FACILITY:  [facility]  PHYSICIAN:  Derrill Kay, MD       DATE OF BIRTH:  31-Mar-1941  DATE OF ADMISSION:  05/19/2011 DATE OF DISCHARGE:  LH                             HISTORY & PHYSICAL   CHIEF COMPLAINT:  Dizziness.  HISTORY OF PRESENT ILLNESS:  This is a 70 year old male with a history of several CVAs in the past along with benign positional vertigo who presents to the emergency department for the second time in the last 48 hours because of vertigo.  He was given some meclizine in the ED last night, went home, has taken approximately 3 doses of meclizine without any relief of his vertigo.  He says the vertigo is worse when he stands up and it is relieved when he is lying still.  Denies any nausea or vomiting.  Denies any other focal neurologic deficits.  We have been asked to admit the patient to obtain a MRI to make sure this is not underlying stroke.  He did have a CT of his head which was benign last night.  This has not been repeated tonight.  PAST MEDICAL HISTORY: 1. History of CVA without any residual effects. 2. Diabetes. 3. Coronary artery disease. 4. History of C. diff. 5. Status post cholecystectomy. 6. Hypertension. 7. Status post CABG. 8. Dyslipidemia, thyroid cyst removal.  REVIEW OF SYSTEMS:  Otherwise negative.  MEDICATIONS:  See epic list.  ALLERGIES:  None.  SOCIAL HISTORY:  He does not smoke, no alcohol, no IV drug abuse.  PHYSICAL EXAMINATION:  VITALS:  Temperature is 98.1, pulse 70, respirations 16, blood pressure 137/51, 96% O2 sats on room air. GENERAL:  He is alert and oriented x3.  No apparent distress, cooperative and friendly. HEENT:  Extraocular muscles are intact.  Pupils equal and reactive to light.  Oropharynx clear.  Mucous membranes moist. NECK:  No JVD.  No carotid bruits. COR:  Regular rate and rhythm  without murmurs or gallops. CHEST:  Clear to auscultation bilaterally.  No wheeze, rhonchi, or rales. ABDOMEN:  Soft, nontender, nondistended.  Positive bowel sounds.  No hepatosplenomegaly. EXTREMITIES:  No clubbing, cyanosis, or edema. PSYCH:  Normal mood and affect. NEURO:  Cranial nerves II XII grossly intact.  No focal neurologic deficits.  Strength 5/5 in upper and lower extremity bilaterally and equal.  Electrolytes are normal.  CBC is normal.  ASSESSMENT/PLAN:  This is a 70 year old male with a history of cerebrovascular accident who presents with vertigo. 1. Vertigo.  This is likely benign positional vertigo, however, to     make sure this is not an recurrent cerebrovascular accident, I will     order MRI, MRA of his brain with intra and extracranial vessels to     be done tomorrow morning.  CT of his head was done about 24 hours     ago which was negative.  I do not feel this is necessary to be     repeated as he is not having any other focal neurologic deficits  and his symptoms are much better when he is lying flat and still.     He is already on significant amount of antiplatelet therapy for his     history of cerebrovascular accidents, will continue that and we     will also obtain neurological checks overnight. 2. Hypertension is stable. 3. Likely benign positional vertigo.  We will continue meclizine     around the clock. 4. Further recommendation pending overall hospital course.                                           ______________________________ Derrill Kay, MD     RD/MEDQ  D:  05/20/2011  T:  05/20/2011  Job:  [id number]

## 2011-05-20 NOTE — Progress Notes (Signed)
UR Chart Review Completed  

## 2011-07-01 NOTE — Progress Notes (Signed)
Encounter addended by: Harriet Pho on: 07/01/2011  6:46 AM<BR>     Documentation filed: Flowsheet VN

## 2012-09-07 ENCOUNTER — Encounter (HOSPITAL_COMMUNITY): Payer: Self-pay | Admitting: *Deleted

## 2012-09-07 ENCOUNTER — Emergency Department (HOSPITAL_COMMUNITY)
Admission: EM | Admit: 2012-09-07 | Discharge: 2012-09-07 | Disposition: A | Discharge status: Home / Self Care | Payer: Non-veteran care | Attending: Emergency Medicine | Admitting: Emergency Medicine

## 2012-09-07 DIAGNOSIS — I10 Essential (primary) hypertension: Secondary | ICD-10-CM | POA: Insufficient documentation

## 2012-09-07 DIAGNOSIS — Z79899 Other long term (current) drug therapy: Secondary | ICD-10-CM | POA: Insufficient documentation

## 2012-09-07 DIAGNOSIS — I2510 Atherosclerotic heart disease of native coronary artery without angina pectoris: Secondary | ICD-10-CM | POA: Insufficient documentation

## 2012-09-07 DIAGNOSIS — Z87891 Personal history of nicotine dependence: Secondary | ICD-10-CM | POA: Insufficient documentation

## 2012-09-07 DIAGNOSIS — N181 Chronic kidney disease, stage 1: Secondary | ICD-10-CM | POA: Insufficient documentation

## 2012-09-07 DIAGNOSIS — Y939 Activity, unspecified: Secondary | ICD-10-CM | POA: Insufficient documentation

## 2012-09-07 DIAGNOSIS — Y929 Unspecified place or not applicable: Secondary | ICD-10-CM | POA: Insufficient documentation

## 2012-09-07 DIAGNOSIS — Z8673 Personal history of transient ischemic attack (TIA), and cerebral infarction without residual deficits: Secondary | ICD-10-CM | POA: Insufficient documentation

## 2012-09-07 DIAGNOSIS — I129 Hypertensive chronic kidney disease with stage 1 through stage 4 chronic kidney disease, or unspecified chronic kidney disease: Secondary | ICD-10-CM | POA: Insufficient documentation

## 2012-09-07 DIAGNOSIS — X58XXXA Exposure to other specified factors, initial encounter: Secondary | ICD-10-CM | POA: Insufficient documentation

## 2012-09-07 DIAGNOSIS — Z7982 Long term (current) use of aspirin: Secondary | ICD-10-CM | POA: Insufficient documentation

## 2012-09-07 DIAGNOSIS — E119 Type 2 diabetes mellitus without complications: Secondary | ICD-10-CM | POA: Insufficient documentation

## 2012-09-07 DIAGNOSIS — L97509 Non-pressure chronic ulcer of other part of unspecified foot with unspecified severity: Secondary | ICD-10-CM | POA: Insufficient documentation

## 2012-09-07 HISTORY — DX: Chronic kidney disease, stage 1: N18.1

## 2012-09-07 HISTORY — DX: Type 2 diabetes mellitus with diabetic chronic kidney disease: E11.22

## 2012-09-07 MED ORDER — AMOXICILLIN-POT CLAVULANATE 500-125 MG PO TABS: 1.0000 | ORAL_TABLET | Freq: Three times a day (TID) | ORAL | Status: DC

## 2012-09-07 NOTE — ED Notes (Signed)
Area of rt foot bleeding per pt,

## 2012-09-07 NOTE — ED Provider Notes (Signed)
History  This chart was scribed for Charles Speak, MD by [name]. This patient was seen in room APA09/APA09 and the patient's care was started at 1513.  CSN: [id number]  Arrival date & time 09/07/12  1503   First MD Initiated Contact with Patient 09/07/12 1513      Chief Complaint  Patient presents with  . Foot Injury     The history is provided by the patient. No language interpreter was used.    [name] is a 71 y.o. male who presents to the Emergency Department complaining of a right foot injury. He states visiting the [place] podiatrist to get his toenails cut yesterday, and when he woke up this morning the sole of his foot was cracked and bleeding. He denies any injury or ulcer to the area. He has a h/o DM, HTN, and CAD. He is a former smoker but denies alcohol use.    Past Medical History  Diagnosis Date  . Hypertension   . Coronary artery disease   . Diabetes mellitus   . Stroke   . C. difficile diarrhea   . Peripheral vascular disease   . 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 Cholecystectomy   . Coronary artery bypass graft   . Thyroid surgery   . No past surgeries     Family History  Problem Relation Age of Onset  . Diabetes Mother   . Heart failure Mother   . Hypertension Mother   . Diabetes Father   . Heart failure Father   . Hypertension Father   . Diabetes Sister   . Heart failure Sister   . Hypertension Sister   . Diabetes Brother   . Heart failure Brother   . Hypertension Brother     History  Substance Use Topics  . Smoking status: Former Research scientist (life sciences)  . Smokeless tobacco: Never Used  . Alcohol Use: No      Review of Systems  All other systems reviewed and are negative.    A complete 10 system review of systems was obtained and all systems are negative except as noted in the HPI and PMH.    Allergies  Review of patient's allergies indicates no known  allergies.  Home Medications   Current Outpatient Rx  Name  Route  Sig  Dispense  Refill  . CILOSTAZOL 50 MG PO TABS   Oral   Take 50 mg by mouth 2 (two) times daily. FOR BLOOD FLOW (PERIPHERALVASCULAR DISEASE) TAKE 30 MINUTES BEFORE OR 2 HOURS AFTER A MEAL          . ASPIRIN-DIPYRIDAMOLE ER 25-200 MG PO CP12   Oral   Take 1 capsule by mouth 2 (two) times daily.           [name] LOSARTAN POTASSIUM 100 MG PO TABS   Oral   Take 100 mg by mouth daily.           [name] METFORMIN HCL 1000 MG PO TABS   Oral   Take 1,000 mg by mouth 2 (two) times daily with a meal.           . METOPROLOL TARTRATE 50 MG PO TABS   Oral   Take 0.5 tablets (25 mg total) by mouth 2 (two) times daily. FOR HEART/BLOOD PRESSURE. HOLD FOR SYSTOLIC BLOOD PRESSURE LESS THAN 1OO/HEART RATE LESS THAN 60         . OMEPRAZOLE 20  MG PO CPDR   Oral   Take 20 mg by mouth daily.           . OXYBUTYNIN CHLORIDE 5 MG PO TABS   Oral   Take 5 mg by mouth daily.           [name] PIOGLITAZONE HCL 30 MG PO TABS   Oral   Take 30 mg by mouth daily.          [name] ROSUVASTATIN CALCIUM 40 MG PO TABS   Oral   Take 20 mg by mouth at bedtime.           . SPIRONOLACTONE 25 MG PO TABS   Oral   Take 25 mg by mouth daily.             Triage Vitals: BP 146/70  Pulse 68  Temp 98.7 F (37.1 C) (Oral)  Resp 20  Ht 5\' 8"  (1.727 m)  Wt 275 lb (124.739 kg)  BMI 41.81 kg/m2  SpO2 99%  Physical Exam  Nursing note and vitals reviewed. Constitutional: He is oriented to person, place, and time. He appears well-developed and well-nourished. No distress.  HENT:  Head: Normocephalic and atraumatic.  Eyes: EOM are normal. Pupils are equal, round, and reactive to light.  Neck: Normal range of motion. Neck supple. No tracheal deviation present.  Cardiovascular: Normal rate.   Pulmonary/Chest: Effort normal. No respiratory distress.  Abdominal: Soft. He exhibits no distension.  Musculoskeletal: Normal range of motion. He  exhibits no edema.  Neurological: He is alert and oriented to person, place, and time.  Skin: Skin is warm and dry.       On left foot area with nail of great toe section removed due to ingrown nail. Mild erythema. On plantar aspect parallel oriented crack to skin with no active bleed. No erythema or purulent drainage.   Psychiatric: He has a normal mood and affect. His behavior is normal.    ED Course  Procedures (including critical care time)  DIAGNOSTIC STUDIES: Oxygen Saturation is 99% on room air, normal by my interpretation.    COORDINATION OF CARE:  3:22 PM: Discussed treatment plan which includes antibiotics and keeping the area clean with pt at bedside and pt agreed to plan.    Labs Reviewed - No data to display No results found.   No diagnosis found.    MDM  Will treat with augmentin due to the diabetes and appearance of the great toe.  To return prn.      I personally performed the services described in this documentation, which was scribed in my presence. The recorded information has been reviewed and is accurate.      Charles Speak, MD 09/08/12 [phone]

## 2012-09-07 NOTE — ED Notes (Signed)
Pt presents with rt toe abrasion/laceration. Pt is a diabetic and wanted his toe checked. Bleeding controled at this time. No s/s of infection.

## 2012-10-01 ENCOUNTER — Encounter (HOSPITAL_COMMUNITY): Payer: Self-pay | Admitting: *Deleted

## 2012-10-01 ENCOUNTER — Other Ambulatory Visit: Payer: Self-pay

## 2012-10-01 ENCOUNTER — Emergency Department (HOSPITAL_COMMUNITY): Payer: Medicare Other

## 2012-10-01 ENCOUNTER — Emergency Department (HOSPITAL_COMMUNITY): Payer: PRIVATE HEALTH INSURANCE

## 2012-10-01 ENCOUNTER — Emergency Department (HOSPITAL_COMMUNITY)
Admission: EM | Admit: 2012-10-01 | Discharge: 2012-10-02 | Disposition: A | Discharge status: Home / Self Care | Payer: Medicare Other | Attending: Emergency Medicine | Admitting: Emergency Medicine

## 2012-10-01 DIAGNOSIS — Z7982 Long term (current) use of aspirin: Secondary | ICD-10-CM | POA: Insufficient documentation

## 2012-10-01 DIAGNOSIS — I2510 Atherosclerotic heart disease of native coronary artery without angina pectoris: Secondary | ICD-10-CM | POA: Insufficient documentation

## 2012-10-01 DIAGNOSIS — R062 Wheezing: Secondary | ICD-10-CM | POA: Insufficient documentation

## 2012-10-01 DIAGNOSIS — Z951 Presence of aortocoronary bypass graft: Secondary | ICD-10-CM | POA: Insufficient documentation

## 2012-10-01 DIAGNOSIS — I739 Peripheral vascular disease, unspecified: Secondary | ICD-10-CM | POA: Insufficient documentation

## 2012-10-01 DIAGNOSIS — I129 Hypertensive chronic kidney disease with stage 1 through stage 4 chronic kidney disease, or unspecified chronic kidney disease: Secondary | ICD-10-CM | POA: Insufficient documentation

## 2012-10-01 DIAGNOSIS — Z8673 Personal history of transient ischemic attack (TIA), and cerebral infarction without residual deficits: Secondary | ICD-10-CM | POA: Insufficient documentation

## 2012-10-01 DIAGNOSIS — E119 Type 2 diabetes mellitus without complications: Secondary | ICD-10-CM | POA: Insufficient documentation

## 2012-10-01 DIAGNOSIS — Z79899 Other long term (current) drug therapy: Secondary | ICD-10-CM | POA: Insufficient documentation

## 2012-10-01 DIAGNOSIS — N181 Chronic kidney disease, stage 1: Secondary | ICD-10-CM | POA: Insufficient documentation

## 2012-10-01 DIAGNOSIS — R05 Cough: Secondary | ICD-10-CM | POA: Insufficient documentation

## 2012-10-01 DIAGNOSIS — R079 Chest pain, unspecified: Secondary | ICD-10-CM | POA: Insufficient documentation

## 2012-10-01 DIAGNOSIS — R059 Cough, unspecified: Secondary | ICD-10-CM | POA: Insufficient documentation

## 2012-10-01 DIAGNOSIS — Z87891 Personal history of nicotine dependence: Secondary | ICD-10-CM | POA: Insufficient documentation

## 2012-10-01 NOTE — ED Provider Notes (Addendum)
History   This chart was scribed for Richarda Blade, MD by Ludger Nutting, ED Scribe. This patient was seen in room APA14/APA14 and the patient's care was started at 2304.   CSN: [id number]  Arrival date & time 10/01/12  2019   First MD Initiated Contact with Patient 10/01/12 2304      Chief Complaint  Patient presents with  . Wheezing  . Cough  . Rib Injury     HPI Comments: There is no associated weakness, dizziness, nausea, vomiting, headache, or paresthesias. There are no modifying factors. He had spontaneous resolution of both the cough and wheeze; prior to arrival in the emergency department.  Patient is a 71 y.o. male presenting with cough. The history is provided by the patient. No language interpreter was used.  Cough Associated symptoms include wheezing.    [name] is a 71 y.o. male who presents to the Emergency Department complaining of a productive cough with yellow sputum and wheezing that started today and has resolved today. Pt has not taken any medications at home to help with the cough/wheezing. Pt also reports associated pain on his left side of rib cage but denies any injury to the area. Pt denies past similar symptoms. Pt denies any fever or diarrhea. He reports a h/o DM, cholecystecomty, HTN, hyperlipidemia,   [facility]    Past Medical History  Diagnosis Date  . Hypertension   . Coronary artery disease   . Diabetes mellitus   . Stroke   . C. difficile diarrhea   . Peripheral vascular disease   . 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 Cholecystectomy   . Coronary artery bypass graft   . Thyroid surgery   . No past surgeries     Family History  Problem Relation Age of Onset  . Diabetes Mother   . Heart failure Mother   . Hypertension Mother   . Diabetes Father   . Heart failure Father   . Hypertension Father   . Diabetes Sister   . Heart failure Sister   .  Hypertension Sister   . Diabetes Brother   . Heart failure Brother   . Hypertension Brother     History  Substance Use Topics  . Smoking status: Former Research scientist (life sciences)  . Smokeless tobacco: Never Used  . Alcohol Use: No      Review of Systems  Constitutional: Negative for fever.  Respiratory: Positive for cough (productive with yellow sputum) and wheezing.   All other systems reviewed and are negative.    Allergies  Review of patient's allergies indicates no known allergies.  Home Medications   Current Outpatient Rx  Name  Route  Sig  Dispense  Refill  . AMOXICILLIN-POT CLAVULANATE 500-125 MG PO TABS   Oral   Take 1 tablet (500 mg total) by mouth every 8 (eight) hours.   21 tablet   0   . ASPIRIN-DIPYRIDAMOLE ER 25-200 MG PO CP12   Oral   Take 1 capsule by mouth 2 (two) times daily.           [name] LOSARTAN POTASSIUM 100 MG PO TABS   Oral   Take 100 mg by mouth daily.           [name] METFORMIN HCL 1000 MG PO TABS   Oral   Take 1,000 mg by mouth 2 (two) times daily with a meal.           .  METOPROLOL TARTRATE 50 MG PO TABS   Oral   Take 0.5 tablets (25 mg total) by mouth 2 (two) times daily. FOR HEART/BLOOD PRESSURE. HOLD FOR SYSTOLIC BLOOD PRESSURE LESS THAN 1OO/HEART RATE LESS THAN 60         . OMEPRAZOLE 20 MG PO CPDR   Oral   Take 20 mg by mouth daily.           . OXYBUTYNIN CHLORIDE 5 MG PO TABS   Oral   Take 5 mg by mouth daily.           [name] PIOGLITAZONE HCL 30 MG PO TABS   Oral   Take 30 mg by mouth daily.          [name] ROSUVASTATIN CALCIUM 40 MG PO TABS   Oral   Take 20 mg by mouth at bedtime.           . SPIRONOLACTONE 25 MG PO TABS   Oral   Take 25 mg by mouth daily.             BP 164/72  Pulse 70  Temp 98.5 F (36.9 C) (Oral)  Resp 20  SpO2 98%  Physical Exam  Nursing note and vitals reviewed. Constitutional: He is oriented to person, place, and time. He appears well-developed and well-nourished. No distress.  HENT:  Head:  Normocephalic and atraumatic.  Eyes: Conjunctivae normal and EOM are normal. Pupils are equal, round, and reactive to light.  Neck: Normal range of motion. Neck supple. No tracheal deviation present.  Cardiovascular: Normal rate, regular rhythm and normal heart sounds.   Pulmonary/Chest: Effort normal and breath sounds normal. No respiratory distress. He has no wheezes.  Abdominal: Soft. Bowel sounds are normal.  Musculoskeletal: Normal range of motion.  Neurological: He is alert and oriented to person, place, and time.  Skin: Skin is warm and dry.  Psychiatric: He has a normal mood and affect. His behavior is normal.    ED Course  Procedures (including critical care time)  DIAGNOSTIC STUDIES: Oxygen Saturation is 98% on room air, normal by my interpretation.    COORDINATION OF CARE: 11:25 PM Patient informed of current plan for treatment and evaluation and agrees with plan at this time.     Date: 08/18/2012  Rate: 69  Rhythm: normal sinus rhythm  QRS Axis: left  PR and QT Intervals: normal  ST/T Wave abnormalities: normal  PR and QRS Conduction Disutrbances:none  Narrative Interpretation: poor R wave progression  Old EKG Reviewed: none available   Labs Reviewed - No data to display Dg Chest 2 View  10/01/2012  *RADIOLOGY REPORT*  Clinical Data: Left flank pain, wheezing and shortness of breath.  CHEST - 2 VIEW  Comparison: Chest radiograph performed 02/27/2007  Findings: The lungs are well-aerated and clear.  Pulmonary vascularity is at the upper limits of normal.  There is no evidence of focal opacification, pleural effusion or pneumothorax. Bilateral nipple shadows are seen.  The heart is borderline normal in size; the patient is status post median sternotomy.  No acute osseous abnormalities are seen.  IMPRESSION: No acute cardiopulmonary process seen.   Original Report Authenticated By: Santa Lighter, M.D.    Nursing notes, applicable records and vitals  reviewed.  Radiologic Images/Reports reviewed.   1. Cough       MDM  Nonspecific cough with essentially normal, evaluation. He has mild, hypertension, currently under treatment. Doubt metabolic instability, serious bacterial infection or impending vascular collapse; the patient is stable  for discharge.   Plan: Home Medications- usual, Robitussin, if needed; Home Treatments- rest, fluids; Recommended follow up- PCP, when necessary    I personally performed the services described in this documentation, which was scribed in my presence. The recorded information has been reviewed and is accurate.     Richarda Blade, MD 10/01/12 [place], MD 10/02/12 [phone]

## 2012-10-01 NOTE — ED Notes (Signed)
Pt c/o cough, wheezing, and left sided rib pain x 1 wk. Pt states the left side of his rib cage hurts when he moves. Pt denies any injury.

## 2012-11-28 ENCOUNTER — Encounter: Payer: Self-pay | Admitting: Family Medicine

## 2012-11-28 ENCOUNTER — Ambulatory Visit (INDEPENDENT_AMBULATORY_CARE_PROVIDER_SITE_OTHER): Payer: Medicare Other | Admitting: Family Medicine

## 2012-11-28 VITALS — BP 150/72 | HR 77 | Resp 18 | Ht 68.0 in | Wt 291.1 lb

## 2012-11-28 DIAGNOSIS — I739 Peripheral vascular disease, unspecified: Secondary | ICD-10-CM | POA: Insufficient documentation

## 2012-11-28 DIAGNOSIS — I10 Essential (primary) hypertension: Secondary | ICD-10-CM

## 2012-11-28 DIAGNOSIS — E669 Obesity, unspecified: Secondary | ICD-10-CM

## 2012-11-28 DIAGNOSIS — I2510 Atherosclerotic heart disease of native coronary artery without angina pectoris: Secondary | ICD-10-CM | POA: Insufficient documentation

## 2012-11-28 DIAGNOSIS — E1129 Type 2 diabetes mellitus with other diabetic kidney complication: Secondary | ICD-10-CM | POA: Insufficient documentation

## 2012-11-28 DIAGNOSIS — IMO0002 Reserved for concepts with insufficient information to code with codable children: Secondary | ICD-10-CM

## 2012-11-28 DIAGNOSIS — Z8673 Personal history of transient ischemic attack (TIA), and cerebral infarction without residual deficits: Secondary | ICD-10-CM

## 2012-11-28 DIAGNOSIS — N3281 Overactive bladder: Secondary | ICD-10-CM | POA: Insufficient documentation

## 2012-11-28 DIAGNOSIS — IMO0001 Reserved for inherently not codable concepts without codable children: Secondary | ICD-10-CM

## 2012-11-28 DIAGNOSIS — E66813 Obesity, class 3: Secondary | ICD-10-CM | POA: Insufficient documentation

## 2012-11-28 DIAGNOSIS — E785 Hyperlipidemia, unspecified: Secondary | ICD-10-CM | POA: Insufficient documentation

## 2012-11-28 DIAGNOSIS — E1165 Type 2 diabetes mellitus with hyperglycemia: Secondary | ICD-10-CM

## 2012-11-28 DIAGNOSIS — N318 Other neuromuscular dysfunction of bladder: Secondary | ICD-10-CM

## 2012-11-28 DIAGNOSIS — E782 Mixed hyperlipidemia: Secondary | ICD-10-CM | POA: Insufficient documentation

## 2012-11-28 NOTE — Assessment & Plan Note (Signed)
No peripheral vascular disease he is not currently on any medications for this. Advised him to increase his exercise.

## 2012-11-28 NOTE — Progress Notes (Signed)
  Subjective:    Patient ID: [name], male    DOB: 04/30/1941, 72 y.o.   MRN: [id number]  HPI   Pt here to establish care. He is currently followed by the [facility] in [place]. He is followed by cardiology, podiatry, ophthalmology he previously endocrinology Medications and history reviewed He is a very complex 72 year old male with history of coronary artery disease diabetes mellitus hypertension and peripheral vascular disease. He was found to have multiple blockages in the coronary arteries in 2007 had bypass surgery done. He also has a history of obstructive sleep apnea is currently using a CPAP machine. Diagnosed with diabetes mellitus about 20 years ago he's been on insulin for greater than 10 years. His last A1c was 8.1%. She's currently on both oral and insulin medication he was followed by endocrinologist about his A1c down to 7.1% however the endocrinologist left [facility]. He does not watch his diet very well and he does not exercise He is history of to mini strokes last in 2008 he was in a skilled nursing facility for week however regained most of his function he does have a lagging eyelid on the left side and some weakness in his left hand   Review of Systems   GEN- denies fatigue, fever, weight loss,weakness, recent illness HEENT- denies eye drainage, change in vision, nasal discharge, CVS- denies chest pain, palpitations RESP- denies SOB, cough, wheeze ABD- denies N/V, change in stools, abd pain GU- denies dysuria, hematuria, dribbling, incontinence MSK- denies joint pain, muscle aches, injury Neuro- denies headache, dizziness, syncope, seizure activity      Objective:   Physical Exam GEN- NAD, alert and oriented x3 HEENT- PERRL, EOMI, non injected sclera, pink conjunctiva, MMM, oropharynx clear, lagging left eye lid Neck- Supple, CVS- RRR, no murmur RESP-CTAB ABD-NABS,soft,NT,ND EXT- No edema Pulses-  Radial 2+, DP- diminished Diabetic foot exam below Psych- normal affect and mood      Assessment & Plan:

## 2012-11-28 NOTE — Assessment & Plan Note (Signed)
Patient is uncontrolled diabetes mellitus [facility]. He states that his fasting blood sugars the past couple of the weeks have been less than 120 he has had some hypoglycemic episodes. He no longer follows with an endocrinologist who will see if this will be needed. [facility] therefore most of his medical care we'll continue to take place there. Unfortunately he does not watch his diet. He'll followup in 6 weeks with his blood glucose meter

## 2012-11-28 NOTE — Assessment & Plan Note (Signed)
We discussed need to keep his comorbidities under good control

## 2012-11-28 NOTE — Assessment & Plan Note (Signed)
He states his blood pressures typically well-controlled he does have a meter at home which he runs normal he is bring this at the next visit to make sure that is accurate his blood pressure is elevated in the office today he is on multiple medications. He may need to have his metoprolol increased back to the 50 mg twice a day

## 2012-11-28 NOTE — Assessment & Plan Note (Signed)
History of to mini strokes per report he is on Plavix he was previously on Aggrenox he has very minimal residual symptoms

## 2012-11-28 NOTE — Assessment & Plan Note (Signed)
Continue ditropan

## 2012-11-28 NOTE — Patient Instructions (Signed)
Call for blood sugars > 150 fasting Continue current medications  F/U 6 weeks for diabetes and blood pressure, bring your meter

## 2013-01-09 ENCOUNTER — Encounter: Payer: Self-pay | Admitting: Family Medicine

## 2013-01-09 ENCOUNTER — Ambulatory Visit (INDEPENDENT_AMBULATORY_CARE_PROVIDER_SITE_OTHER): Payer: Medicare Other | Admitting: Family Medicine

## 2013-01-09 VITALS — BP 142/64 | HR 70 | Resp 18 | Ht 68.0 in | Wt 293.0 lb

## 2013-01-09 DIAGNOSIS — E119 Type 2 diabetes mellitus without complications: Secondary | ICD-10-CM

## 2013-01-09 DIAGNOSIS — IMO0001 Reserved for inherently not codable concepts without codable children: Secondary | ICD-10-CM

## 2013-01-09 DIAGNOSIS — IMO0002 Reserved for concepts with insufficient information to code with codable children: Secondary | ICD-10-CM

## 2013-01-09 DIAGNOSIS — E785 Hyperlipidemia, unspecified: Secondary | ICD-10-CM

## 2013-01-09 DIAGNOSIS — E1165 Type 2 diabetes mellitus with hyperglycemia: Secondary | ICD-10-CM

## 2013-01-09 DIAGNOSIS — I10 Essential (primary) hypertension: Secondary | ICD-10-CM

## 2013-01-09 NOTE — Progress Notes (Signed)
  Subjective:    Patient ID: [name], male    DOB: [birth year], 72 y.o.   MRN: [id number]  HPI Patient here for interim visit to followup hypertension and diabetes mellitus. He does tell me he had one of his urology medications change for his overactive bladder. He did not bring his blood glucose meter but his blood sugars have been low 100s fasting he has had some hypoglycemia down to 37 in the evening he's currently giving 40 units in the morning and 50 units at bedtime   Review of Systems    GEN- denies fatigue, fever, weight loss,weakness, recent illness HEENT- denies eye drainage, change in vision, nasal discharge, CVS- denies chest pain, palpitations RESP- denies SOB, cough, wheeze ABD- denies N/V, change in stools, abd pain Neuro- denies headache, dizziness, syncope, seizure activity      Objective:   Physical Exam GEN- NAD, alert and oriented x3 CVS- RRR, no murmur RESP-CTAB EXT- No edema Pulses- Radial 2        Assessment & Plan:

## 2013-01-09 NOTE — Patient Instructions (Addendum)
Decrease insulin in the evening to 45 units, in morning take 40 units Continue blood pressure pills Continue checking sugar twice a day Get the labs done fasting, we will call with results F/U 3 months

## 2013-01-10 LAB — LIPID PANEL
Cholesterol: 158 mg/dL (ref 0–200)
HDL: 39 mg/dL — ABNORMAL LOW (ref >39)
LDL Cholesterol: 101 mg/dL — ABNORMAL HIGH (ref 0–99)
Total CHOL/HDL Ratio: 4.1 Ratio
Triglycerides: 92 mg/dL (ref <150)
VLDL: 18 mg/dL (ref 0–40)

## 2013-01-10 LAB — CBC
HCT: 40.6 % (ref 39.0–52.0)
Hemoglobin: 13.5 g/dL (ref 13.0–17.0)
MCH: 29.9 pg (ref 26.0–34.0)
MCHC: 33.3 g/dL (ref 30.0–36.0)
MCV: 90 fL (ref 78.0–100.0)
Platelets: 211 10*3/uL (ref 150–400)
RBC: 4.51 MIL/uL (ref 4.22–5.81)
RDW: 13.4 % (ref 11.5–15.5)
WBC: 6.3 10*3/uL (ref 4.0–10.5)

## 2013-01-10 LAB — COMPREHENSIVE METABOLIC PANEL
ALT: 28 U/L (ref 0–53)
AST: 17 U/L (ref 0–37)
Albumin: 3.8 g/dL (ref 3.5–5.2)
Alkaline Phosphatase: 43 U/L (ref 39–117)
BUN: 13 mg/dL (ref 6–23)
CO2: 24 mEq/L (ref 19–32)
Calcium: 9.1 mg/dL (ref 8.4–10.5)
Chloride: 105 mEq/L (ref 96–112)
Creat: 1.02 mg/dL (ref 0.50–1.35)
Glucose, Bld: 124 mg/dL — ABNORMAL HIGH (ref 70–99)
Potassium: 4 mEq/L (ref 3.5–5.3)
Sodium: 141 mEq/L (ref 135–145)
Total Bilirubin: 0.6 mg/dL (ref 0.3–1.2)
Total Protein: 7.2 g/dL (ref 6.0–8.3)

## 2013-01-10 LAB — MICROALBUMIN / CREATININE URINE RATIO
Creatinine, Urine: 90 mg/dL
Microalb Creat Ratio: 13.7 mg/g (ref 0.0–30.0)
Microalb, Ur: 1.23 mg/dL (ref 0.00–1.89)

## 2013-01-10 LAB — ESTIMATED GFR
GFR, Est African American: 86 mL/min
GFR, Est Non African American: 75 mL/min

## 2013-01-10 LAB — HEMOGLOBIN A1C
Hgb A1c MFr Bld: 8 % — ABNORMAL HIGH (ref <5.7)
Mean Plasma Glucose: 183 mg/dL — ABNORMAL HIGH (ref <117)

## 2013-01-10 NOTE — Assessment & Plan Note (Signed)
Check fasting labs, his records from [place] have not arrived Decrease evening dose to 45 units due to hypoglycemia

## 2013-01-10 NOTE — Assessment & Plan Note (Signed)
BP improved today, reports at higher doses of BB he was symptomatic HA, dizziness

## 2013-02-19 ENCOUNTER — Telehealth: Payer: Self-pay

## 2013-02-19 MED ORDER — DEXTROMETHORPHAN-GUAIFENESIN 10-100 MG/5ML PO LIQD: 5.0000 mL | ORAL | Status: DC | PRN

## 2013-02-19 NOTE — Telephone Encounter (Signed)
Noted  

## 2013-02-19 NOTE — Telephone Encounter (Signed)
Diabetic tussin called in

## 2013-02-27 ENCOUNTER — Encounter: Payer: Self-pay | Admitting: *Deleted

## 2013-03-08 ENCOUNTER — Telehealth: Payer: Self-pay

## 2013-03-08 ENCOUNTER — Encounter: Payer: Self-pay | Admitting: Family Medicine

## 2013-03-08 NOTE — Telephone Encounter (Signed)
CPAP  mask ordered

## 2013-04-11 ENCOUNTER — Ambulatory Visit (INDEPENDENT_AMBULATORY_CARE_PROVIDER_SITE_OTHER): Payer: Medicare Other | Admitting: Family Medicine

## 2013-04-11 ENCOUNTER — Encounter: Payer: Self-pay | Admitting: Family Medicine

## 2013-04-11 VITALS — BP 120/60 | HR 72 | Temp 98.8°F | Resp 18 | Ht 67.0 in | Wt 289.0 lb

## 2013-04-11 DIAGNOSIS — E1165 Type 2 diabetes mellitus with hyperglycemia: Secondary | ICD-10-CM

## 2013-04-11 DIAGNOSIS — G4733 Obstructive sleep apnea (adult) (pediatric): Secondary | ICD-10-CM

## 2013-04-11 DIAGNOSIS — E669 Obesity, unspecified: Secondary | ICD-10-CM

## 2013-04-11 DIAGNOSIS — IMO0002 Reserved for concepts with insufficient information to code with codable children: Secondary | ICD-10-CM

## 2013-04-11 DIAGNOSIS — E785 Hyperlipidemia, unspecified: Secondary | ICD-10-CM

## 2013-04-11 DIAGNOSIS — I10 Essential (primary) hypertension: Secondary | ICD-10-CM

## 2013-04-11 DIAGNOSIS — IMO0001 Reserved for inherently not codable concepts without codable children: Secondary | ICD-10-CM

## 2013-04-11 DIAGNOSIS — B353 Tinea pedis: Secondary | ICD-10-CM

## 2013-04-11 LAB — HEMOGLOBIN A1C, FINGERSTICK: Hgb A1C (fingerstick): 6.6 % — ABNORMAL HIGH (ref <5.7)

## 2013-04-11 MED ORDER — CLOTRIMAZOLE-BETAMETHASONE 1-0.05 % EX CREA: TOPICAL_CREAM | Freq: Two times a day (BID) | CUTANEOUS | Status: DC

## 2013-04-11 NOTE — Progress Notes (Signed)
  Subjective:    Patient ID: [name], male    DOB: [birth year], 72 y.o.   MRN: [id number]  HPI  Patient presents to follow chronic medical problems. He has no specific concerns. He is followed mostly by the [place]. He is seen ophthalmology in July he will be seeing podiatry in August.  He still having difficulty getting his CPAP mask he wants a new one as there is air leaking around the current one he has. [facility] so he did not get one . Diabetes mellitus he did not bring his meter with him today states that his blood sugars have been in the 120s. He had one episode of hypoglycemia but states he has not eaten anything that evening. [facility]  Review of Systems - per above  GEN- denies fatigue, fever, weight loss,weakness, recent illness HEENT- denies eye drainage, change in vision, nasal discharge, CVS- denies chest pain, palpitations RESP- denies SOB, cough, wheeze ABD- denies N/V, change in stools, abd pain GU- denies dysuria, hematuria, dribbling, incontinence MSK- denies joint pain, muscle aches, injury Neuro- denies headache, dizziness, syncope, seizure activity      Objective:   Physical Exam  GEN- NAD, alert and oriented x3 HEENT- PERRL, EOMI, non injected sclera, pink conjunctiva, MMM, oropharynx clear Neck- Supple no bruit CVS- RRR, no murmur RESP-CTAB EXT- No edema Pulses- Radial 2+, DP equal bilat Skin- moisture erythema with some peeling skin between webs of toes on right foot         Assessment & Plan:

## 2013-04-11 NOTE — Patient Instructions (Addendum)
Continue current medicatoins Follow up with your doctors at the [place] A1C 6.6% Use the cream for your foot twice a day until clear Work on your diet, try to lose 10lbs by our next visit F/U 4 months

## 2013-04-12 ENCOUNTER — Ambulatory Visit: Payer: Medicare Other | Admitting: Family Medicine

## 2013-04-12 DIAGNOSIS — G4733 Obstructive sleep apnea (adult) (pediatric): Secondary | ICD-10-CM | POA: Insufficient documentation

## 2013-04-12 DIAGNOSIS — B353 Tinea pedis: Secondary | ICD-10-CM | POA: Insufficient documentation

## 2013-04-12 LAB — BASIC METABOLIC PANEL
BUN: 14 mg/dL (ref 6–23)
CO2: 24 mEq/L (ref 19–32)
Calcium: 9.2 mg/dL (ref 8.4–10.5)
Chloride: 106 mEq/L (ref 96–112)
Creat: 1.1 mg/dL (ref 0.50–1.35)
Glucose, Bld: 114 mg/dL — ABNORMAL HIGH (ref 70–99)
Potassium: 4.3 mEq/L (ref 3.5–5.3)
Sodium: 140 mEq/L (ref 135–145)

## 2013-04-12 NOTE — Assessment & Plan Note (Signed)
Lotrisone to be used, and can also use Gold Bond powder

## 2013-04-12 NOTE — Assessment & Plan Note (Signed)
Discussed proper diet and exercise. Short-term goal of 10 pounds loss by next visit

## 2013-04-12 NOTE — Assessment & Plan Note (Signed)
It appears has a prior history multiple times in the [place] to try to get his sleep study and has been able to get this. Advise patient he may need to call her pulmonologist I performed a sleep study at the [facility] so that we can get him a new mass. I will also request records again specifically for his sleep study as well as his colonoscopy

## 2013-04-12 NOTE — Assessment & Plan Note (Signed)
Blood pressure looks good today no change in medication 

## 2013-04-12 NOTE — Assessment & Plan Note (Signed)
His A1c in the office today was 6.6%. He is currently doing well with his diabetes metabolic panel is pending. He will continue his NovoLog at the current dose along with his metformin He is on statin drug as well as ARB

## 2013-04-12 NOTE — Assessment & Plan Note (Signed)
LDL is at goal

## 2013-05-10 ENCOUNTER — Other Ambulatory Visit: Payer: Self-pay

## 2013-08-13 ENCOUNTER — Encounter: Payer: Self-pay | Admitting: Family Medicine

## 2013-08-13 ENCOUNTER — Ambulatory Visit (INDEPENDENT_AMBULATORY_CARE_PROVIDER_SITE_OTHER): Payer: Medicare Other | Admitting: Family Medicine

## 2013-08-13 VITALS — BP 110/60 | HR 78 | Temp 97.4°F | Resp 16 | Wt 285.0 lb

## 2013-08-13 DIAGNOSIS — E119 Type 2 diabetes mellitus without complications: Secondary | ICD-10-CM

## 2013-08-13 DIAGNOSIS — IMO0002 Reserved for concepts with insufficient information to code with codable children: Secondary | ICD-10-CM

## 2013-08-13 DIAGNOSIS — I10 Essential (primary) hypertension: Secondary | ICD-10-CM

## 2013-08-13 DIAGNOSIS — E1165 Type 2 diabetes mellitus with hyperglycemia: Secondary | ICD-10-CM

## 2013-08-13 DIAGNOSIS — E669 Obesity, unspecified: Secondary | ICD-10-CM

## 2013-08-13 DIAGNOSIS — Z23 Encounter for immunization: Secondary | ICD-10-CM

## 2013-08-13 LAB — HEMOGLOBIN A1C, FINGERSTICK: Hgb A1C (fingerstick): 6.8 % — ABNORMAL HIGH (ref <5.7)

## 2013-08-13 NOTE — Patient Instructions (Signed)
Continue current medications A1C is 6.8% Flu shot given  Work on the walking , you need to get down another 15 lbs  F/U 6 months

## 2013-08-13 NOTE — Assessment & Plan Note (Signed)
Blood pressure is well controlled 

## 2013-08-13 NOTE — Progress Notes (Signed)
  Subjective:    Patient ID: [name], male    DOB: 1941-07-04, 72 y.o.   MRN: [id number]  HPI  Pt here to f/u chronic medical problems, No Specific concerns. [facility] at the [facility] by his primary doctor who did some labs, podiatry and opthalmology. He had partial nail removal by podiatry secondary to nail fungus.  He has diabetic shoes as well as compression hose. Due to have cataracts removed in near future DM- no hypoglycemia, did not bring any readings with him today , sates yesterday was 200 fasting, no hypoglycemia   Review of Systems  GEN- denies fatigue, fever, weight loss,weakness, recent illness HEENT- denies eye drainage, change in vision, nasal discharge, CVS- denies chest pain, palpitations RESP- denies SOB, cough, wheeze ABD- denies N/V, change in stools, abd pain GU- denies dysuria, hematuria, dribbling, incontinence MSK- denies joint pain, muscle aches, injury Neuro- denies headache, dizziness, syncope, seizure activity      Objective:   Physical Exam  GEN- NAD, alert and oriented x3 HEENT- PERRL, EOMI, non injected sclera, pink conjunctiva, MMM, oropharynx clear Neck- Supple, no thryomegaly CVS- RRR, no murmur RESP-CTAB EXT- No edema Pulses- Radial 2+, DP- decreaesd Bilat       Assessment & Plan:

## 2013-08-13 NOTE — Assessment & Plan Note (Signed)
A1c looks good today. He will continue his current dose of insulin as well as his Glucophage and Actos. [facility]

## 2013-08-13 NOTE — Assessment & Plan Note (Signed)
Continue to work on his diet. He is down about 5 pounds since her last visit. He is very sedentary

## 2013-08-22 ENCOUNTER — Emergency Department (HOSPITAL_COMMUNITY): Payer: Medicare Other

## 2013-08-22 ENCOUNTER — Inpatient Hospital Stay (HOSPITAL_COMMUNITY): Payer: Medicare Other

## 2013-08-22 ENCOUNTER — Inpatient Hospital Stay (HOSPITAL_COMMUNITY)
Admission: EM | Admit: 2013-08-22 | Discharge: 2013-08-24 | DRG: 065 | Disposition: A | Discharge status: Home / Self Care | Payer: Medicare Other | Attending: Internal Medicine | Admitting: Internal Medicine

## 2013-08-22 ENCOUNTER — Encounter (HOSPITAL_COMMUNITY): Payer: Self-pay | Admitting: Emergency Medicine

## 2013-08-22 DIAGNOSIS — IMO0001 Reserved for inherently not codable concepts without codable children: Secondary | ICD-10-CM

## 2013-08-22 DIAGNOSIS — E1165 Type 2 diabetes mellitus with hyperglycemia: Secondary | ICD-10-CM

## 2013-08-22 DIAGNOSIS — Z6841 Body Mass Index (BMI) 40.0 and over, adult: Secondary | ICD-10-CM

## 2013-08-22 DIAGNOSIS — Z794 Long term (current) use of insulin: Secondary | ICD-10-CM

## 2013-08-22 DIAGNOSIS — H409 Unspecified glaucoma: Secondary | ICD-10-CM | POA: Diagnosis present

## 2013-08-22 DIAGNOSIS — I2510 Atherosclerotic heart disease of native coronary artery without angina pectoris: Secondary | ICD-10-CM | POA: Diagnosis present

## 2013-08-22 DIAGNOSIS — K219 Gastro-esophageal reflux disease without esophagitis: Secondary | ICD-10-CM | POA: Diagnosis present

## 2013-08-22 DIAGNOSIS — E785 Hyperlipidemia, unspecified: Secondary | ICD-10-CM

## 2013-08-22 DIAGNOSIS — Z8249 Family history of ischemic heart disease and other diseases of the circulatory system: Secondary | ICD-10-CM

## 2013-08-22 DIAGNOSIS — E1129 Type 2 diabetes mellitus with other diabetic kidney complication: Secondary | ICD-10-CM | POA: Diagnosis present

## 2013-08-22 DIAGNOSIS — I739 Peripheral vascular disease, unspecified: Secondary | ICD-10-CM | POA: Diagnosis present

## 2013-08-22 DIAGNOSIS — G4733 Obstructive sleep apnea (adult) (pediatric): Secondary | ICD-10-CM | POA: Diagnosis present

## 2013-08-22 DIAGNOSIS — N181 Chronic kidney disease, stage 1: Secondary | ICD-10-CM | POA: Diagnosis present

## 2013-08-22 DIAGNOSIS — I6350 Cerebral infarction due to unspecified occlusion or stenosis of unspecified cerebral artery: Principal | ICD-10-CM | POA: Diagnosis present

## 2013-08-22 DIAGNOSIS — I129 Hypertensive chronic kidney disease with stage 1 through stage 4 chronic kidney disease, or unspecified chronic kidney disease: Secondary | ICD-10-CM | POA: Diagnosis present

## 2013-08-22 DIAGNOSIS — I10 Essential (primary) hypertension: Secondary | ICD-10-CM | POA: Diagnosis present

## 2013-08-22 DIAGNOSIS — R531 Weakness: Secondary | ICD-10-CM | POA: Diagnosis present

## 2013-08-22 DIAGNOSIS — Z951 Presence of aortocoronary bypass graft: Secondary | ICD-10-CM

## 2013-08-22 DIAGNOSIS — I639 Cerebral infarction, unspecified: Secondary | ICD-10-CM | POA: Diagnosis present

## 2013-08-22 DIAGNOSIS — G8190 Hemiplegia, unspecified affecting unspecified side: Secondary | ICD-10-CM | POA: Diagnosis present

## 2013-08-22 DIAGNOSIS — Z833 Family history of diabetes mellitus: Secondary | ICD-10-CM

## 2013-08-22 DIAGNOSIS — Z8673 Personal history of transient ischemic attack (TIA), and cerebral infarction without residual deficits: Secondary | ICD-10-CM

## 2013-08-22 DIAGNOSIS — G9389 Other specified disorders of brain: Secondary | ICD-10-CM | POA: Diagnosis present

## 2013-08-22 DIAGNOSIS — E782 Mixed hyperlipidemia: Secondary | ICD-10-CM | POA: Diagnosis present

## 2013-08-22 DIAGNOSIS — M6281 Muscle weakness (generalized): Secondary | ICD-10-CM

## 2013-08-22 DIAGNOSIS — Z87891 Personal history of nicotine dependence: Secondary | ICD-10-CM

## 2013-08-22 DIAGNOSIS — E66813 Obesity, class 3: Secondary | ICD-10-CM | POA: Diagnosis present

## 2013-08-22 LAB — COMPREHENSIVE METABOLIC PANEL
ALT: 20 U/L (ref 0–53)
AST: 21 U/L (ref 0–37)
Albumin: 3.6 g/dL (ref 3.5–5.2)
Alkaline Phosphatase: 55 U/L (ref 39–117)
BUN: 10 mg/dL (ref 6–23)
CO2: 29 mEq/L (ref 19–32)
Calcium: 9.4 mg/dL (ref 8.4–10.5)
Chloride: 103 mEq/L (ref 96–112)
Creatinine, Ser: 0.95 mg/dL (ref 0.50–1.35)
GFR calc Af Amer: >90 mL/min (ref >90)
GFR calc non Af Amer: 82 mL/min — ABNORMAL LOW (ref >90)
Glucose, Bld: 108 mg/dL — ABNORMAL HIGH (ref 70–99)
Potassium: 4.2 mEq/L (ref 3.5–5.1)
Sodium: 142 mEq/L (ref 135–145)
Total Bilirubin: 0.8 mg/dL (ref 0.3–1.2)
Total Protein: 7.7 g/dL (ref 6.0–8.3)

## 2013-08-22 LAB — GLUCOSE, CAPILLARY
Glucose-Capillary: 84 mg/dL (ref 70–99)
Glucose-Capillary: 113 mg/dL — ABNORMAL HIGH (ref 70–99)
Glucose-Capillary: 112 mg/dL — ABNORMAL HIGH (ref 70–99)

## 2013-08-22 LAB — URINALYSIS, ROUTINE W REFLEX MICROSCOPIC
Bilirubin Urine: NEGATIVE
Glucose, UA: NEGATIVE mg/dL
Hgb urine dipstick: NEGATIVE
Ketones, ur: NEGATIVE mg/dL
Leukocytes, UA: NEGATIVE
Nitrite: NEGATIVE
Protein, ur: NEGATIVE mg/dL
Specific Gravity, Urine: 1.02 (ref 1.005–1.030)
Urobilinogen, UA: 0.2 mg/dL (ref 0.0–1.0)
pH: 6.5 (ref 5.0–8.0)

## 2013-08-22 LAB — CBC
HCT: 42.6 % (ref 39.0–52.0)
Hemoglobin: 14 g/dL (ref 13.0–17.0)
MCH: 30.6 pg (ref 26.0–34.0)
MCHC: 32.9 g/dL (ref 30.0–36.0)
MCV: 93 fL (ref 78.0–100.0)
Platelets: 213 10*3/uL (ref 150–400)
RBC: 4.58 MIL/uL (ref 4.22–5.81)
RDW: 13.7 % (ref 11.5–15.5)
WBC: 6.5 10*3/uL (ref 4.0–10.5)

## 2013-08-22 LAB — RAPID URINE DRUG SCREEN, HOSP PERFORMED
Amphetamines: NOT DETECTED
Barbiturates: NOT DETECTED
Benzodiazepines: NOT DETECTED
Cocaine: NOT DETECTED
Opiates: NOT DETECTED
Tetrahydrocannabinol: NOT DETECTED

## 2013-08-22 LAB — DIFFERENTIAL
Basophils Absolute: 0 10*3/uL (ref 0.0–0.1)
Basophils Relative: 0 % (ref 0–1)
Eosinophils Absolute: 0.1 10*3/uL (ref 0.0–0.7)
Eosinophils Relative: 1 % (ref 0–5)
Lymphocytes Relative: 34 % (ref 12–46)
Lymphs Abs: 2.2 10*3/uL (ref 0.7–4.0)
Monocytes Absolute: 0.7 10*3/uL (ref 0.1–1.0)
Monocytes Relative: 11 % (ref 3–12)
Neutro Abs: 3.5 10*3/uL (ref 1.7–7.7)
Neutrophils Relative %: 54 % (ref 43–77)

## 2013-08-22 LAB — APTT: aPTT: 27 seconds (ref 24–37)

## 2013-08-22 LAB — HEMOGLOBIN A1C
Hgb A1c MFr Bld: 7.3 % — ABNORMAL HIGH (ref <5.7)
Mean Plasma Glucose: 163 mg/dL — ABNORMAL HIGH (ref <117)

## 2013-08-22 LAB — PROTIME-INR
INR: 1.06 (ref 0.00–1.49)
Prothrombin Time: 13.6 seconds (ref 11.6–15.2)

## 2013-08-22 LAB — ETHANOL: Alcohol, Ethyl (B): <11 mg/dL (ref 0–11)

## 2013-08-22 MED ORDER — METOPROLOL TARTRATE 25 MG PO TABS
25.0000 mg | ORAL_TABLET | Freq: Two times a day (BID) | ORAL | Status: DC
  Administered 2013-08-22 – 2013-08-24 (×5): 25 mg via ORAL
  Filled 2013-08-22 (×5): qty 1

## 2013-08-22 MED ORDER — PANTOPRAZOLE SODIUM 40 MG PO TBEC
40.0000 mg | DELAYED_RELEASE_TABLET | Freq: Every day | ORAL | Status: DC
  Administered 2013-08-22 – 2013-08-24 (×3): 40 mg via ORAL
  Filled 2013-08-22 (×3): qty 1

## 2013-08-22 MED ORDER — LATANOPROST 0.005 % OP SOLN
1.0000 [drp] | Freq: Every day | OPHTHALMIC | Status: DC
  Administered 2013-08-22 – 2013-08-23 (×2): 1 [drp] via OPHTHALMIC
  Filled 2013-08-22: qty 2.5

## 2013-08-22 MED ORDER — SENNOSIDES-DOCUSATE SODIUM 8.6-50 MG PO TABS: 1.0000 | ORAL_TABLET | Freq: Every evening | ORAL | Status: DC | PRN

## 2013-08-22 MED ORDER — INSULIN ASPART PROT & ASPART (70-30 MIX) 100 UNIT/ML ~~LOC~~ SUSP
40.0000 [IU] | Freq: Two times a day (BID) | SUBCUTANEOUS | Status: DC
  Administered 2013-08-22 – 2013-08-24 (×4): 40 [IU] via SUBCUTANEOUS
  Filled 2013-08-22: qty 10

## 2013-08-22 MED ORDER — DORZOLAMIDE HCL-TIMOLOL MAL 2-0.5 % OP SOLN
1.0000 [drp] | Freq: Two times a day (BID) | OPHTHALMIC | Status: DC
  Administered 2013-08-22 – 2013-08-24 (×4): 1 [drp] via OPHTHALMIC
  Filled 2013-08-22: qty 10

## 2013-08-22 MED ORDER — BRIMONIDINE TARTRATE 0.2 % OP SOLN
1.0000 [drp] | Freq: Three times a day (TID) | OPHTHALMIC | Status: DC
  Administered 2013-08-22 – 2013-08-24 (×6): 1 [drp] via OPHTHALMIC
  Filled 2013-08-22: qty 5

## 2013-08-22 MED ORDER — INSULIN ASPART 100 UNIT/ML ~~LOC~~ SOLN
0.0000 [IU] | Freq: Three times a day (TID) | SUBCUTANEOUS | Status: DC
  Administered 2013-08-23 – 2013-08-24 (×3): 3 [IU] via SUBCUTANEOUS

## 2013-08-22 MED ORDER — ENOXAPARIN SODIUM 40 MG/0.4ML ~~LOC~~ SOLN
40.0000 mg | SUBCUTANEOUS | Status: DC
  Administered 2013-08-22 – 2013-08-23 (×2): 40 mg via SUBCUTANEOUS
  Filled 2013-08-22 (×2): qty 0.4

## 2013-08-22 MED ORDER — LOSARTAN POTASSIUM 50 MG PO TABS
100.0000 mg | ORAL_TABLET | Freq: Every day | ORAL | Status: DC
  Administered 2013-08-22 – 2013-08-24 (×3): 100 mg via ORAL
  Filled 2013-08-22 (×4): qty 2

## 2013-08-22 MED ORDER — TAMSULOSIN HCL 0.4 MG PO CAPS
0.4000 mg | ORAL_CAPSULE | Freq: Every day | ORAL | Status: DC
  Administered 2013-08-23 – 2013-08-24 (×2): 0.4 mg via ORAL
  Filled 2013-08-22 (×3): qty 1

## 2013-08-22 MED ORDER — ONDANSETRON HCL 4 MG/2ML IJ SOLN: 4.0000 mg | Freq: Four times a day (QID) | INTRAMUSCULAR | Status: DC | PRN

## 2013-08-22 MED ORDER — ASPIRIN 81 MG PO CHEW
81.0000 mg | CHEWABLE_TABLET | Freq: Every day | ORAL | Status: DC
  Administered 2013-08-22 – 2013-08-24 (×3): 81 mg via ORAL
  Filled 2013-08-22 (×3): qty 1

## 2013-08-22 MED ORDER — SODIUM CHLORIDE 0.9 % IV SOLN
INTRAVENOUS | Status: AC
  Administered 2013-08-22 – 2013-08-23 (×2): via INTRAVENOUS

## 2013-08-22 MED ORDER — SPIRONOLACTONE 25 MG PO TABS
25.0000 mg | ORAL_TABLET | Freq: Every day | ORAL | Status: DC
  Administered 2013-08-22 – 2013-08-24 (×3): 25 mg via ORAL
  Filled 2013-08-22 (×3): qty 1

## 2013-08-22 MED ORDER — ACETAMINOPHEN 650 MG RE SUPP: 650.0000 mg | RECTAL | Status: DC | PRN

## 2013-08-22 MED ORDER — AMLODIPINE BESYLATE 5 MG PO TABS
10.0000 mg | ORAL_TABLET | Freq: Every day | ORAL | Status: DC
  Administered 2013-08-22 – 2013-08-24 (×3): 10 mg via ORAL
  Filled 2013-08-22 (×3): qty 2

## 2013-08-22 MED ORDER — ACETAMINOPHEN 325 MG PO TABS: 650.0000 mg | ORAL_TABLET | ORAL | Status: DC | PRN

## 2013-08-22 MED ORDER — ATORVASTATIN CALCIUM 40 MG PO TABS
40.0000 mg | ORAL_TABLET | Freq: Every day | ORAL | Status: DC
  Administered 2013-08-22 – 2013-08-23 (×2): 40 mg via ORAL
  Filled 2013-08-22 (×2): qty 1

## 2013-08-22 MED ORDER — CLOPIDOGREL BISULFATE 75 MG PO TABS
75.0000 mg | ORAL_TABLET | Freq: Every day | ORAL | Status: DC
  Administered 2013-08-22 – 2013-08-24 (×3): 75 mg via ORAL
  Filled 2013-08-22 (×3): qty 1

## 2013-08-22 NOTE — H&P (Signed)
[facility] History and Physical  [name] [id number] DOB: 24-Apr-1941 DOA: 08/22/2013  Referring physician: Dr. Regenia Skeeter PCP: Vic Blackbird, MD  Specialists:   Chief Complaint: Left-sided weakness  HPI: [name] is a 72 y.o. 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 Patient denies any numbness no tingling. Patient denies any facial droop, no slurred speech, no visual deficits. Patient denies any fever, no chills, no chest pain, no shortness of breath, no nausea, no vomiting, no abdominal pain, no diarrhea, no constipation. Patient denies any dysuria. Patient stated that symptoms did not resolve and he subsequently presented to the ED today. In the ED CT of the head which was done was negative for any acute infarct. Comprehensive metabolic profile is unremarkable. CBC was unremarkable. EKG showed sinus bradycardia with first-degree AV block. Q waves in leads 2,3. Poor R wave progression. ED physician stated he spoke to neurology who will be consulting on the patient. We were called to admit the patient for further evaluation and management. Due to late presentation patient was deemed not a TPA candidate.  Review of Systems: The patient denies anorexia, fever, weight loss,, vision loss, decreased hearing, hoarseness, chest pain, syncope, dyspnea on exertion, peripheral edema, balance deficits, hemoptysis, abdominal pain, melena, hematochezia, severe indigestion/heartburn, hematuria, incontinence, genital sores, muscle weakness, suspicious skin lesions, transient blindness, difficulty walking, depression, unusual weight change, abnormal bleeding, enlarged lymph nodes, angioedema, and breast masses.    Past Medical History   Diagnosis Date  . Hypertension   . Coronary artery disease   . Diabetes mellitus   . C. difficile diarrhea   . Peripheral vascular disease   . 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 Glaucoma   . GERD (gastroesophageal reflux disease)   . Stroke 2008  . Sleep apnea    Past Surgical History  Procedure Laterality Date  . Cholecystectomy    . Coronary artery bypass graft    . Thyroid surgery    . No past surgeries     Social History:  reports that he has quit smoking. He has never used smokeless tobacco. He reports that he does not drink alcohol or use illicit drugs.  No Known Allergies  Family History  Problem Relation Age of Onset  . Diabetes Mother   . Heart failure Mother   . Hypertension Mother   . Diabetes Father   . Heart failure Father   . Hypertension Father   . Diabetes Sister   . Heart failure Sister   . Hypertension Sister   . Hyperlipidemia Sister   . Diabetes Brother   . Heart failure Brother   . Hypertension Brother     Prior to Admission medications   Medication Sig Start Date End Date Taking? Authorizing Provider  amLODipine (NORVASC) 10 MG tablet Take 10 mg by mouth daily.   Yes Historical Provider, MD  atorvastatin (LIPITOR) 80 MG tablet Take 40 mg by mouth daily.    Yes Historical Provider, MD  brimonidine (ALPHAGAN) 0.2 % ophthalmic solution Place 1 drop into both eyes 3 (three) times daily.   Yes Historical Provider, MD  clopidogrel (PLAVIX) 75 MG tablet Take 75 mg by mouth daily.   Yes Historical Provider, MD  dorzolamide-timolol (COSOPT) 22.3-6.8  MG/ML ophthalmic solution Place 1 drop into both eyes 2 (two) times daily.   Yes Historical Provider, MD  insulin aspart protamine-insulin aspart (NOVOLOG 70/30) (70-30) 100 UNIT/ML injection Inject into the skin 2 (two) times daily with a meal. Inject 40 units in AM and 50 unit in PM   Yes Historical Provider, MD  latanoprost (XALATAN) 0.005 % ophthalmic solution Place 1 drop  into both eyes at bedtime.   Yes Historical Provider, MD  losartan (COZAAR) 100 MG tablet Take 100 mg by mouth daily.     Yes Historical Provider, MD  metFORMIN (GLUCOPHAGE) 1000 MG tablet Take 1,000 mg by mouth 2 (two) times daily with a meal.     Yes Historical Provider, MD  metoprolol (LOPRESSOR) 50 MG tablet Take 0.5 tablets (25 mg total) by mouth 2 (two) times daily. FOR HEART/BLOOD PRESSURE. HOLD FOR SYSTOLIC BLOOD PRESSURE LESS THAN 1OO/HEART RATE LESS THAN 60 05/20/11  Yes Annita Brod, MD  pantoprazole (PROTONIX) 40 MG tablet Take 40 mg by mouth daily.   Yes Historical Provider, MD  pioglitazone (ACTOS) 30 MG tablet Take 30 mg by mouth daily.    Yes Historical Provider, MD  spironolactone (ALDACTONE) 25 MG tablet Take 25 mg by mouth daily.     Yes Historical Provider, MD  tamsulosin (FLOMAX) 0.4 MG CAPS Take 0.4 mg by mouth daily.    Yes Historical Provider, MD   Physical Exam: Filed Vitals:   08/22/13 1412  BP: 169/69  Pulse: 60  Temp: 98.1 F (36.7 C)  Resp: 18     General:  Well-developed well-nourished in no acute cardiopulmonary distress.  Eyes: Pupils equal round and reactive to light and accommodation. Extraocular movements intact.  ENT: Oropharynx is clear, no lesions, no exudates.  Neck: Supple with no lymphadenopathy.  Cardiovascular: Regular rate rhythm no murmurs rubs or gallops. No JVD. No lower extremity edema.  Respiratory: Clear to auscultation bilaterally, no wheezes, no crackles, no rhonchi.  Abdomen: Soft, nontender, mildly distended, positive bowel sounds.  Skin: No rashes or lesions.  Musculoskeletal: 5/5 right upper extremity and right lower extremity distress. 3/5 left upper extremity strength. 4/5 left lower extremity strength.  Psychiatric: Normal mood. Normal affect. Fair insight. Fair judgment.  Neurologic: Alert and oriented x3. Cranial nerves II through XII are grossly intact. Sensation is intact. Visual fields are intact. Unable to  elicit reflexes symmetrically in brachial and patellar and activities reflexes. Patient with a 3/5 left upper extremity strength. 4/5 left lower extremity strength. Gait not tested secondary to safety.  Labs on Admission:  Basic Metabolic Panel:  Recent Labs Lab 08/22/13 1059  NA 142  K 4.2  CL 103  CO2 29  GLUCOSE 108*  BUN 10  CREATININE 0.95  CALCIUM 9.4   Liver Function Tests:  Recent Labs Lab 08/22/13 1059  AST 21  ALT 20  ALKPHOS 55  BILITOT 0.8  PROT 7.7  ALBUMIN 3.6   No results found for this basename: LIPASE, AMYLASE,  in the last 168 hours No results found for this basename: AMMONIA,  in the last 168 hours CBC:  Recent Labs Lab 08/22/13 1059  WBC 6.5  NEUTROABS 3.5  HGB 14.0  HCT 42.6  MCV 93.0  PLT 213   Cardiac Enzymes: No results found for this basename: CKTOTAL, CKMB, CKMBINDEX, TROPONINI,  in the last 168 hours  BNP (last 3 results) No results found for this basename: PROBNP,  in the last 8760 hours CBG:  Recent Labs Lab 08/22/13  [place] on Admission: Ct Head Wo Contrast  08/22/2013   CLINICAL DATA:  Left extremity weakness since 08/21/2013. Diabetic hypertensive patient with history of prior stroke 2009. No known injury  EXAM: CT HEAD WITHOUT CONTRAST  TECHNIQUE: Contiguous axial images were obtained from the base of the skull through the vertex without intravenous contrast.  COMPARISON:  05/20/2011 MR. 11/23/2008 CT.  FINDINGS: Left sphenoid sinus moderate mucosal thickening. Mild maxillary sinus mucosal thickening.  No intracranial hemorrhage.  Remote thalamic infarcts.  Small vessel disease type changes without CT evidence of large acute infarct.  Mild atrophy without hydrocephalus.  Vascular calcifications.  No intracranial mass lesion noted on this unenhanced exam.  IMPRESSION: Small vessel disease type changes without CT evidence of large acute infarct.  No intracranial hemorrhage.  Remote thalamic  infarcts.  Left sphenoid sinus moderate mucosal thickening. Mild maxillary sinus mucosal thickening.   Electronically Signed   By: Chauncey Cruel M.D.   On: 08/22/2013 11:35   US Carotid Duplex Bilateral  08/22/2013   CLINICAL DATA:  Stroke-like symptoms  EXAM: BILATERAL CAROTID DUPLEX ULTRASOUND  TECHNIQUE: Pearline Cables scale imaging, color Doppler and duplex ultrasound were performed of bilateral carotid and vertebral arteries in the neck.  COMPARISON:  None.  FINDINGS: Criteria: Quantification of carotid stenosis is based on velocity parameters that correlate the residual internal carotid diameter with NASCET-based stenosis levels, using the diameter of the distal internal carotid lumen as the denominator for stenosis measurement.  The following velocity measurements were obtained:  RIGHT  ICA:  73/15 cm/sec  CCA:  Q000111Q cm/sec  SYSTOLIC ICA/CCA RATIO:  [id number]  DIASTOLIC ICA/CCA RATIO:  0000000  ECA:  116 cm/sec  LEFT  ICA:  67/10 cm/sec  CCA:  0000000 cm/sec  SYSTOLIC ICA/CCA RATIO:  0000000  DIASTOLIC ICA/CCA RATIO:  99991111  ECA:  107 cm/sec  RIGHT CAROTID ARTERY: Mild plaque formation is noted. No focal hemodynamically significant stenosis is noted.  RIGHT VERTEBRAL ARTERY:  Antegrade in nature.  LEFT CAROTID ARTERY: Minimal plaque formation is noted. No focal hemodynamically significant stenosis is noted.  LEFT VERTEBRAL ARTERY:  Antegrade in nature.  IMPRESSION: No evidence of focal hemodynamically significant carotid stenosis.   Electronically Signed   By: Inez Catalina M.D.   On: 08/22/2013 15:07    EKG: Independently reviewed. Sinus bradycardia with first degree AV block. Q waves in leads 3 and aVF. Poor R wave progression.  Assessment/Plan Principal Problem:   CVA (cerebral infarction) rule out CVA with left sided weakness Active Problems:   HTN (hypertension)   Diabetes mellitus type II, uncontrolled   PVD (peripheral vascular disease)   CAD (coronary artery disease)   Hyperlipidemia   Obesity   History of  stroke   OSA (obstructive sleep apnea)   Left-sided weakness  #1 left-sided weakness/rule out CVA Patient had presented with worsening left-sided weakness, prior history of CVA x2, hypertension, diabetes, coronary artery disease status post CABG in the past, obstructive sleep apnea. Patient states he's been compliant with his Plavix. Concern for CVA. Will admit to telemetry and stroke workup underway. Will check a 2-D echo, carotid Dopplers, MRI MRA of the head. Continue Plavix for now. If MRI is positive for an acute stroke may add a baby aspirin to his Plavix for secondary stroke prevention. Continue statin, antihypertensive medications. Neurology consultation is pending.  #2 hypertension Stable. Continue Norvasc, Cozaar, Lopressor, Aldactone, Flomax.  #3 type 2 diabetes Check a hemoglobin A1c. Continue 70/30.  Will place on a sliding scale insulin.  #4 hyperlipidemia Check a fasting lipid panel. Continue statin.  #5 obstructive sleep apnea CPAP each bedtime.  #6 coronary artery disease Stable. Continue statin, Plavix, Cozaar, Lopressor, spironolactone, Norvasc. Follow.  #7 prophylaxis Lovenox for DVT prophylaxis.   Code Status: Full Family Communication: Updated patient, wife, daughter at bedside. Disposition Plan: Admit to telemetry  Time spent: 70 mins  [facility] Pager [phone]  If 7PM-7AM, please contact night-coverage [email or web address] Password [facility] 08/22/2013, 3:25 PM

## 2013-08-22 NOTE — ED Notes (Addendum)
C/o left sided weakness that began yesterday am. Hx of stroke per pt. Denies pain. Pt alert upon arrival. Answers questions appropriately. Grip to left hand weak.

## 2013-08-22 NOTE — ED Provider Notes (Signed)
CSN: [id number]     Arrival date & time 08/22/13  1036 History  This chart was scribed for Charles Hamburger, MD by Roxan Diesel, ED scribe.  This patient was seen in room APA16A/APA16A and the patient's care was started at 10:41 AM.   Chief Complaint  Patient presents with  . Extremity Weakness    The history is provided by the patient. No language interpreter was used.    HPI Comments: [name] is a 72 y.o. male with h/o 2 strokes (most recent 2011) who presents to the Emergency Department complaining of left-sided extremity weakness that began yesterday afternoon and worsened this morning.  Pt denies any specific precipitating factors and states he does not know what he was doing when his symptoms began.  He states that his weakness is worse in his arm than his leg.  He denies numbness.  Per wife, pt has left-sided weakness residual to his most recent stroke in 2011, but he mostly recovered and has very minimal weakness at baseline.  Pt denies HA, SOB, CP, visual disturbance, difficulty swallowing, aphasia, fevers, vomiting, abdominal pain, urinary symptoms, cough, or any other associated symptoms.  He is on Plavix.  He does not take aspirin.   Past Medical History  Diagnosis Date  . Hypertension   . Coronary artery disease   . Diabetes mellitus   . C. difficile diarrhea   . Peripheral vascular disease   . 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 Glaucoma   . GERD (gastroesophageal reflux disease)   . Stroke 2008  . Sleep apnea     Past Surgical History  Procedure Laterality Date  . Cholecystectomy    . Coronary artery bypass graft    . Thyroid surgery    . No past surgeries      Family History  Problem Relation Age of Onset  . Diabetes Mother   . Heart failure Mother   . Hypertension Mother   . Diabetes Father   . Heart failure Father   . Hypertension Father   . Diabetes Sister   . Heart failure Sister   . Hypertension Sister   .  Hyperlipidemia Sister   . Diabetes Brother   . Heart failure Brother   . Hypertension Brother     History  Substance Use Topics  . Smoking status: Former Research scientist (life sciences)  . Smokeless tobacco: Never Used  . Alcohol Use: No     Review of Systems  Constitutional: Negative for fever.  HENT: Negative for trouble swallowing.   Eyes: Negative for visual disturbance.  Respiratory: Negative for cough and shortness of breath.   Cardiovascular: Negative for chest pain.  Gastrointestinal: Negative for vomiting and abdominal pain.  Genitourinary: Negative for dysuria, urgency, frequency, hematuria and difficulty urinating.  Neurological: Positive for weakness. Negative for speech difficulty and headaches.  All other systems reviewed and are negative.     Allergies  Review of patient's allergies indicates no known allergies.  Home Medications   Current Outpatient Rx  Name  Route  Sig  Dispense  Refill  . amLODipine (NORVASC) 10 MG tablet   Oral   Take 10 mg by mouth daily.         [name] atorvastatin (LIPITOR) 80 MG tablet   Oral   Take 40 mg by mouth daily.          . brimonidine (ALPHAGAN) 0.2 % ophthalmic solution   Both Eyes   Place 1 drop into both  eyes 3 (three) times daily.         . clopidogrel (PLAVIX) 75 MG tablet   Oral   Take 75 mg by mouth daily.         . clotrimazole-betamethasone (LOTRISONE) cream   Topical   Apply topically 2 (two) times daily.   30 g   0   . dextromethorphan-guaiFENesin (DIABETIC TUSSIN DM) 10-100 MG/5ML liquid   Oral   Take 5 mLs by mouth every 4 (four) hours as needed for cough.   240 mL   0   . dorzolamide-timolol (COSOPT) 22.3-6.8 MG/ML ophthalmic solution   Both Eyes   Place 1 drop into both eyes 2 (two) times daily.         . insulin aspart protamine-insulin aspart (NOVOLOG 70/30) (70-30) 100 UNIT/ML injection   Subcutaneous   Inject into the skin 2 (two) times daily with a meal. Inject 40 units in AM and 50 unit in PM          . latanoprost (XALATAN) 0.005 % ophthalmic solution   Both Eyes   Place 1 drop into both eyes at bedtime.         [name] losartan (COZAAR) 100 MG tablet   Oral   Take 100 mg by mouth daily.           . metFORMIN (GLUCOPHAGE) 1000 MG tablet   Oral   Take 1,000 mg by mouth 2 (two) times daily with a meal.           . metoprolol (LOPRESSOR) 50 MG tablet   Oral   Take 0.5 tablets (25 mg total) by mouth 2 (two) times daily. FOR HEART/BLOOD PRESSURE. HOLD FOR SYSTOLIC BLOOD PRESSURE LESS THAN 1OO/HEART RATE LESS THAN 60         . oxybutynin (DITROPAN) 5 MG tablet   Oral   Take 5 mg by mouth 2 (two) times daily.          . pantoprazole (PROTONIX) 40 MG tablet   Oral   Take 40 mg by mouth daily.         . pioglitazone (ACTOS) 30 MG tablet   Oral   Take 30 mg by mouth daily.          [name] spironolactone (ALDACTONE) 25 MG tablet   Oral   Take 25 mg by mouth daily.           . tamsulosin (FLOMAX) 0.4 MG CAPS   Oral   Take by mouth daily.          BP 144/67  Pulse 68  Resp 19  SpO2 98%  Physical Exam  Nursing note and vitals reviewed. Constitutional: He is oriented to person, place, and time. He appears well-developed and well-nourished. No distress.  HENT:  Head: Normocephalic and atraumatic.  Eyes: EOM are normal. Pupils are equal, round, and reactive to light.  Neck: Neck supple. No tracheal deviation present.  Cardiovascular: Normal rate, regular rhythm and normal heart sounds.   No murmur heard. Pulmonary/Chest: Effort normal and breath sounds normal. No respiratory distress. He has no wheezes. He has no rales.  Musculoskeletal: Normal range of motion.  Neurological: He is alert and oriented to person, place, and time.  5/5 strength in lower extremities 4/5 strength in left upper extremity Cranial nerves 2-12 normal Speech normal  Skin: Skin is warm and dry.  Psychiatric: He has a normal mood and affect. His behavior is normal.    ED Course  Procedures (including critical care time)  DIAGNOSTIC STUDIES: Oxygen Saturation is 98% on room air, normal by my interpretation.    COORDINATION OF CARE: 10:46 AM-Discussed treatment plan which includes labs and imaging with pt at bedside and pt agreed to plan.    Labs Review Labs Reviewed  COMPREHENSIVE METABOLIC PANEL - Abnormal; Notable for the following:    Glucose, Bld 108 (*)    GFR calc non Af Amer 82 (*)    All other components within normal limits  ETHANOL  PROTIME-INR  APTT  CBC  DIFFERENTIAL  URINE RAPID DRUG SCREEN (HOSP PERFORMED)  URINALYSIS, ROUTINE W REFLEX MICROSCOPIC  GLUCOSE, CAPILLARY  HEMOGLOBIN A1C  CBC  CREATININE, SERUM    Imaging Review Ct Head Wo Contrast  08/22/2013   CLINICAL DATA:  Left extremity weakness since 08/21/2013. Diabetic hypertensive patient with history of prior stroke 2009. No known injury  EXAM: CT HEAD WITHOUT CONTRAST  TECHNIQUE: Contiguous axial images were obtained from the base of the skull through the vertex without intravenous contrast.  COMPARISON:  05/20/2011 MR. 11/23/2008 CT.  FINDINGS: Left sphenoid sinus moderate mucosal thickening. Mild maxillary sinus mucosal thickening.  No intracranial hemorrhage.  Remote thalamic infarcts.  Small vessel disease type changes without CT evidence of large acute infarct.  Mild atrophy without hydrocephalus.  Vascular calcifications.  No intracranial mass lesion noted on this unenhanced exam.  IMPRESSION: Small vessel disease type changes without CT evidence of large acute infarct.  No intracranial hemorrhage.  Remote thalamic infarcts.  Left sphenoid sinus moderate mucosal thickening. Mild maxillary sinus mucosal thickening.   Electronically Signed   By: Chauncey Cruel M.D.   On: 08/22/2013 11:35     EKG Interpretation     Ventricular Rate:  59 PR Interval:  218 QRS Duration: 102 QT Interval:  356 QTC Calculation: 352 R Axis:   -63 Text Interpretation:  Sinus bradycardia with 1st  degree A-V block Left axis deviation Possible Anterior infarct (cited on or before 16-Feb-2007) Abnormal ECG When compared with ECG of 01-Oct-2012 20:58, No significant change was found            MDM   1. CVA (cerebral vascular accident)    Patient's signs and symptoms are consistent with a recurrent stroke with left-sided symptoms. As his symptoms started over 12 hours ago he is not a candidate for TPA or a code stroke. Discussed with Neurology, will not give any acute treatments and will admit to the hospitalist.    I personally performed the services described in this documentation, which was scribed in my presence. The recorded information has been reviewed and is accurate.    Charles Hamburger, MD 08/22/13 [phone]

## 2013-08-22 NOTE — Progress Notes (Signed)
UR Chart Review Completed  

## 2013-08-22 NOTE — ED Notes (Signed)
Report given to [place], RN unit 300. Ready to receive patient.

## 2013-08-23 DIAGNOSIS — I369 Nonrheumatic tricuspid valve disorder, unspecified: Secondary | ICD-10-CM

## 2013-08-23 DIAGNOSIS — I6350 Cerebral infarction due to unspecified occlusion or stenosis of unspecified cerebral artery: Principal | ICD-10-CM

## 2013-08-23 DIAGNOSIS — I2510 Atherosclerotic heart disease of native coronary artery without angina pectoris: Secondary | ICD-10-CM

## 2013-08-23 LAB — BASIC METABOLIC PANEL
BUN: 10 mg/dL (ref 6–23)
CO2: 27 mEq/L (ref 19–32)
Calcium: 9.1 mg/dL (ref 8.4–10.5)
Chloride: 105 mEq/L (ref 96–112)
Creatinine, Ser: 0.97 mg/dL (ref 0.50–1.35)
GFR calc Af Amer: >90 mL/min (ref >90)
GFR calc non Af Amer: 82 mL/min — ABNORMAL LOW (ref >90)
Glucose, Bld: 79 mg/dL (ref 70–99)
Potassium: 3.5 mEq/L (ref 3.5–5.1)
Sodium: 140 mEq/L (ref 135–145)

## 2013-08-23 LAB — LIPID PANEL
Cholesterol: 111 mg/dL (ref 0–200)
HDL: 35 mg/dL — ABNORMAL LOW (ref >39)
LDL Cholesterol: 61 mg/dL (ref 0–99)
Total CHOL/HDL Ratio: 3.2 RATIO
Triglycerides: 77 mg/dL (ref <150)
VLDL: 15 mg/dL (ref 0–40)

## 2013-08-23 LAB — HEMOGLOBIN A1C
Hgb A1c MFr Bld: 7.3 % — ABNORMAL HIGH (ref <5.7)
Mean Plasma Glucose: 163 mg/dL — ABNORMAL HIGH (ref <117)

## 2013-08-23 LAB — CBC
HCT: 39.9 % (ref 39.0–52.0)
Hemoglobin: 12.9 g/dL — ABNORMAL LOW (ref 13.0–17.0)
MCH: 29.9 pg (ref 26.0–34.0)
MCHC: 32.3 g/dL (ref 30.0–36.0)
MCV: 92.4 fL (ref 78.0–100.0)
Platelets: 199 10*3/uL (ref 150–400)
RBC: 4.32 MIL/uL (ref 4.22–5.81)
RDW: 13.6 % (ref 11.5–15.5)
WBC: 5.8 10*3/uL (ref 4.0–10.5)

## 2013-08-23 LAB — GLUCOSE, CAPILLARY
Glucose-Capillary: 80 mg/dL (ref 70–99)
Glucose-Capillary: 139 mg/dL — ABNORMAL HIGH (ref 70–99)
Glucose-Capillary: 129 mg/dL — ABNORMAL HIGH (ref 70–99)
Glucose-Capillary: 150 mg/dL — ABNORMAL HIGH (ref 70–99)

## 2013-08-23 MED ORDER — SODIUM CHLORIDE 0.9 % IV SOLN: 250.0000 mL | INTRAVENOUS | Status: DC | PRN

## 2013-08-23 MED ORDER — POTASSIUM CHLORIDE CRYS ER 20 MEQ PO TBCR
40.0000 meq | EXTENDED_RELEASE_TABLET | Freq: Once | ORAL | Status: AC
  Administered 2013-08-23: 40 meq via ORAL
  Filled 2013-08-23: qty 2

## 2013-08-23 MED ORDER — SODIUM CHLORIDE 0.9 % IJ SOLN: 3.0000 mL | INTRAMUSCULAR | Status: DC | PRN

## 2013-08-23 MED ORDER — SODIUM CHLORIDE 0.9 % IJ SOLN: 3.0000 mL | Freq: Two times a day (BID) | INTRAMUSCULAR | Status: DC

## 2013-08-23 NOTE — Progress Notes (Signed)
*  PRELIMINARY RESULTS* Echocardiogram 2D Echocardiogram has been performed.  Tera Partridge 08/23/2013, 9:45 AM

## 2013-08-23 NOTE — Care Management Note (Addendum)
    Page 1 of 1   08/24/2013     8:04:17 AM   CARE MANAGEMENT NOTE 08/24/2013  Patient:  [name], [name]   Account Number:  [id number]  Date Initiated:  08/23/2013  Documentation initiated by:  Claretha Cooper  Subjective/Objective Assessment:   Pt admitted from home. OT has recommended CIR. Left message for Pamala Hurry at [facility] to assess pt for appropriateness for referral.     Action/Plan:   Anticipated DC Date:  08/24/2013   Anticipated DC Plan:  HOME/SELF CARE  In-house referral  Clinical Social Worker      DC Planning Services  CM consult      Choice offered to / List presented to:             Status of service:  Completed, signed off Medicare Important Message given?   (If response is "NO", the following Medicare IM given date fields will be blank) Date Medicare IM given:   Date Additional Medicare IM given:    Discharge Disposition:  HOME/SELF CARE  Per UR Regulation:    If discussed at Long Length of Stay Meetings, dates discussed:    Comments:  08/24/13 Claretha Cooper RN BSN CM Referral faxed to [facility] PT/OT.  08/23/13 Claretha Cooper RN BSN CM

## 2013-08-23 NOTE — Evaluation (Signed)
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  He reports that he had almost fully recovered from his last stroke and did not need to use an assistive device for gait.  Clinical Impression   Pt was seen for evaluation and found to have mild weakness in the LLE as well as decreased dynamic standing balance.  He is stable with a cane but he would like to progress to gait with no AD.  OP PT is recommended at d/c.    PT Assessment  All further PT needs can be met in the next venue of care    Follow Up Recommendations  Outpatient PT    Does the patient have the potential to tolerate intense rehabilitation      Barriers to Discharge        Equipment Recommendations  None recommended by PT    Recommendations for Other Services     Frequency      Precautions / Restrictions     Pertinent Vitals/Pain       Mobility  Bed Mobility Bed Mobility: Not assessed Transfers Transfers: Sit to Stand;Stand to Sit Sit to Stand: 6: Modified independent (Device/Increase time);From chair/3-in-1;With upper extremity assist Stand to Sit: 6: Modified independent (Device/Increase time);To chair/3-in-1;With upper extremity assist Ambulation/Gait Ambulation/Gait Assistance: 6: Modified independent (Device/Increase time) Ambulation Distance (Feet): 175 Feet Assistive device: Straight cane Gait Pattern: Within Functional Limits Gait velocity: WNL General Gait Details: pt states that he feels mild weakness in the LLE during gait althought this is not evident during gait this afternoon...no instability as long as he is using a cane Stairs: No Wheelchair Mobility Wheelchair Mobility: No    Exercises      PT Diagnosis: Difficulty walking  PT Problem List: Decreased strength;Decreased balance PT Treatment Interventions:       PT Goals(Current goals can be found in the care plan section) Acute Rehab PT Goals PT Goal Formulation: No goals set, d/c therapy  Visit Information  Last PT Received On: 08/23/13 History of Present Illness: Pt with a hx of 2 previous CVAs and mild left hemiparesis is admitted with increased weakness of the left side.  He reports that he had almost fully recovered from his last stroke and did not need to use an assistive device for gait.       Prior Functioning       Cognition       Extremity/Trunk Assessment Lower Extremity Assessment Lower Extremity Assessment: LLE deficits/detail LLE Deficits / Details: mild weakness, 3-/5 strength in the LLE with anterior tibialis strength 2+/5 Cervical / Trunk Assessment Cervical / Trunk Assessment: Normal   Balance Balance Balance Assessed: Yes Static Standing Balance Static Standing - Balance Support: No upper extremity supported Static Standing - Level of Assistance: 5: Stand by assistance High Level Balance High Level Balance Activites: Head turns;Sudden stops;Turns;Backward walking;Side stepping High Level Balance Comments: unable to stand with feet together statically but with above activities and a cane he had no LOB   End of Session PT - End of Session Equipment Utilized During Treatment: Gait belt Activity Tolerance: Patient tolerated treatment well Patient left: in chair;with call bell/phone within reach;with family/visitor present  GP     [name]  L 08/23/2013, 2:06 PM

## 2013-08-23 NOTE — Evaluation (Signed)
Occupational Therapy Evaluation Patient Details Name: [name] MRN: [id number] DOB: November 14, 1940 Today's Date: 08/23/2013 Time: 0825-0850 OT Time Calculation (min): 25 min Evaluation [id number] (38')  OT Assessment / Plan / Recommendation History of present illness HPI: [name] is a 72 y.o. 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  Patient stated that symptoms did not resolve and he subsequently presented to the ED today.   Clinical Impression   [name] presents with decreased range, strength and coordination of LUE with ataxic tendencies; decreased functional mobility, balance and safety due to LLE weakness.  Recommend patient would benefit from skilled OT services to maximize functional potential/independence with desired return home with good family support.     OT Assessment  Patient needs continued OT Services    Follow Up Recommendations  Outpatient OT;CIR    Barriers to Discharge   good family support, motivatied and cooperative   Equipment Recommendations       Recommendations for Other Services Rehab consult  Frequency  Min 5X/week    Precautions / Restrictions Precautions Precautions: Fall Precaution Comments: Left sided weakness Restrictions Weight Bearing Restrictions: No       ADL  Eating/Feeding: Other (comment) Where Assessed - Eating/Feeding: Edge of bed Grooming: Wash/dry hands;Wash/dry face;Minimal assistance;Teeth care Where Assessed - Grooming: Unsupported sitting Upper Body Bathing: Other (comment);Min guard Lower Body Bathing: Minimal assistance Upper Body Dressing: Min guard Lower Body Dressing: Minimal assistance Transfers/Ambulation Related to ADLs: minimal assistance with LLE  weakness and noted foot drop ADL Comments: Patient right hand dominant. difficulty to wring out cloth for bathing and holding cloth for bathing right UB and arm.   Upper dentures and lower partial for feeding     OT Diagnosis: Generalized weakness;Ataxia;Other (comment)  OT Problem List: Decreased strength;Decreased coordination;Decreased range of motion;Decreased activity tolerance;Impaired balance (sitting and/or standing);Impaired UE functional use OT Treatment Interventions: Self-care/ADL training;Therapeutic activities;Therapeutic exercise;Neuromuscular education;Energy conservation;Patient/family education;DME and/or AE instruction;Balance training;Manual therapy;Modalities   OT Goals(Current goals can be found in the care plan section) Acute Rehab OT Goals Patient Stated Goal: to regain functional use of Left side back to baseline  OT Goal Formulation: With patient/family Time For Goal Achievement: 09/06/13 Potential to Achieve Goals: Good ADL Goals Pt Will Perform Grooming: with set-up;with supervision (standing at sink ) Pt Will Perform Upper Body Dressing: with set-up;sitting Pt Will Perform Lower Body Dressing: with supervision;with adaptive equipment (and compensatory dressing techniques ) Pt Will Transfer to Toilet: with supervision;grab bars;ambulating (to elevated toilet and with use of DME as needed ) Pt/caregiver will Perform Home Exercise Program: Increased ROM;Increased strength;Left upper extremity;With Supervision  Visit Information  History of Present Illness: HPI: [name] is a 72 y.o. 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  Patient stated that symptoms did not resolve and he subsequently presented to the  ED today.       Prior [place] expects to be discharged to:: Private residence Living Arrangements: Spouse/significant other Available Help at  Discharge: Family Type of Home: House Home Access: Level entry Home Layout: Two level;Other (Comment);Able to live on main level with bedroom/bathroom Home Equipment: Kasandra Knudsen - single point;Shower seat;Bedside commode Prior Function Level of Independence: Independent with assistive device(s) Comments: intermittent use of cane Communication Communication: No difficulties Dominant Hand: Right         Vision/Perception Vision - History Baseline Vision: Wears glasses all the time Vision - Assessment Additional Comments: no new deficits noted at time of this evaluation    Cognition  Cognition Arousal/Alertness: Awake/alert Behavior During Therapy: WFL for tasks assessed/performed Overall Cognitive Status: Within Functional Limits for tasks assessed    Extremity/Trunk Assessment Upper Extremity Assessment Upper Extremity Assessment: LUE deficits/detail LUE Deficits / Details: Patient presents with ataxic LUE; 75% proximal range; decreased wrist range <25% and 50% Composite finger flexion with delayed movements.  Demos ability to oppose only 2nd and 3rd digit.  LUE Coordination: decreased fine motor;decreased gross motor Lower Extremity Assessment Lower Extremity Assessment: Defer to PT evaluation     Mobility Transfers Transfers: Sit to Stand Sit to Stand: 5: Supervision           End of Session OT - End of Session Activity Tolerance: Patient tolerated treatment well Patient left: with call bell/phone within reach;with family/visitor present  [place], OTR/L  08/23/2013, 9:18 AM

## 2013-08-23 NOTE — Progress Notes (Signed)
[facility] PROGRESS NOTE  [name] [id number] DOB: 08-31-1941 DOA: 08/22/2013 PCP: Vic Blackbird, MD  Assessment/Plan: #1 acute CVA Patient presented with left upper extremity weakness. CT head was negative. MRI MRA of the head consistent with acute nonhemorrhagic linear infarct within the cortical spinal tracts in the posterior right frontal lobe. Lacunar infarcts in the basal ganglia bilaterally have progressed since prior exam without evidence of other acute disease. Remote lacunar infarcts of the thalami bilaterally. Remote encephalomalacia of the left cerebellum. Carotid Dopplers with no significant ICA stenosis. 2-D echo with no evidence of emboli. Patient has remained in normal sinus rhythm. Continue aspirin 81 mg daily and Plavix for secondary stroke prevention. PT/OT. Risk factor modification. Neurology is following and appreciate input and recommendations.  #2 hypertension Stable. Continue home regimen of Norvasc, Cozaar, Lopressor, Aldactone, Flomax.  #3 type 2 diabetes Hemoglobin A1c was 7.3. CBGs have ranged from 80-139. Continue 7030. Sliding scale insulin.  #4 hyperlipidemia LDL at 61. Continue statin.  #5 obstructive sleep apnea CPAP each bedtime.  #6 coronary artery disease Stable. Continue statin, Plavix, aspirin, Cozaar, Lopressor, Aldactone, Norvasc.  #7 prophylaxis Lovenox for DVT prophylaxis.  Code Status: Full Family Communication: Updated patient and wife at bedside. Disposition Plan: Home when medically stable    Consultants:  Neurology: Dr. Merlene Laughter 08/23/2013  Procedures:  CT head 08/22/2013  MRI MRA head 08/22/2013  2-D echo 08/23/2013  Carotid Dopplers 08/23/2013  Antibiotics:  None  HPI/Subjective: Patient states no change in left upper extremity weakness. No other complaints.  Objective: Filed Vitals:   08/23/13 0435  BP: 124/74  Pulse: 52  Temp: 97.6 F (36.4 C)  Resp: 18    Intake/Output Summary (Last 24  hours) at 08/23/13 1453 Last data filed at 08/23/13 0340  Gross per 24 hour  Intake   1035 ml  Output      0 ml  Net   1035 ml   Filed Weights   08/22/13 1412  Weight: 126.644 kg (279 lb 3.2 oz)    Exam:   General:  NAD  Cardiovascular: RRR  Respiratory: CTAB  Abdomen: Soft/NT/ND/+BS  Musculoskeletal: No c/c/e  Neuro: Alert and following commands. Patient still with left upper extremity weakness. No slurred speech. No facial droop. Sensation is intact. Visual fields are intact.   Data Reviewed: Basic Metabolic Panel:  Recent Labs Lab 08/22/13 1059 08/23/13 0537  NA 142 140  K 4.2 3.5  CL 103 105  CO2 29 27  GLUCOSE 108* 79  BUN 10 10  CREATININE 0.95 0.97  CALCIUM 9.4 9.1   Liver Function Tests:  Recent Labs Lab 08/22/13 1059  AST 21  ALT 20  ALKPHOS 55  BILITOT 0.8  PROT 7.7  ALBUMIN 3.6   No results found for this basename: LIPASE, AMYLASE,  in the last 168 hours No results found for this basename: AMMONIA,  in the last 168 hours CBC:  Recent Labs Lab 08/22/13 1059 08/23/13 0537  WBC 6.5 5.8  NEUTROABS 3.5  --   HGB 14.0 12.9*  HCT 42.6 39.9  MCV 93.0 92.4  PLT 213 199   Cardiac Enzymes: No results found for this basename: CKTOTAL, CKMB, CKMBINDEX, TROPONINI,  in the last 168 hours BNP (last 3 results) No results found for this basename: PROBNP,  in the last 8760 hours CBG:  Recent Labs Lab 08/22/13 1106 08/22/13 1638 08/22/13 2148 08/23/13 0749 08/23/13 1202  GLUCAP 84 113* 112* 80 139*    No results found for  this or any previous visit (from the past 240 hour(s)).   Studies: Dg Chest 2 View  08/22/2013   CLINICAL DATA:  Hypertension, diabetes, recent CVA  EXAM: CHEST  2 VIEW  COMPARISON:  10/01/2012  FINDINGS: The heart size and mediastinal contours are within normal limits. Both lungs are clear. The visualized skeletal structures are unremarkable. Patient is status post median sternotomy.  IMPRESSION: No active  cardiopulmonary disease.   Electronically Signed   By: Margaree Mackintosh M.D.   On: 08/22/2013 16:13   Ct Head Wo Contrast  08/22/2013   CLINICAL DATA:  Left extremity weakness since 08/21/2013. Diabetic hypertensive patient with history of prior stroke 2009. No known injury  EXAM: CT HEAD WITHOUT CONTRAST  TECHNIQUE: Contiguous axial images were obtained from the base of the skull through the vertex without intravenous contrast.  COMPARISON:  05/20/2011 MR. 11/23/2008 CT.  FINDINGS: Left sphenoid sinus moderate mucosal thickening. Mild maxillary sinus mucosal thickening.  No intracranial hemorrhage.  Remote thalamic infarcts.  Small vessel disease type changes without CT evidence of large acute infarct.  Mild atrophy without hydrocephalus.  Vascular calcifications.  No intracranial mass lesion noted on this unenhanced exam.  IMPRESSION: Small vessel disease type changes without CT evidence of large acute infarct.  No intracranial hemorrhage.  Remote thalamic infarcts.  Left sphenoid sinus moderate mucosal thickening. Mild maxillary sinus mucosal thickening.   Electronically Signed   By: Chauncey Cruel M.D.   On: 08/22/2013 11:35   Mr Jodene Nam Head Wo Contrast  08/22/2013   CLINICAL DATA:  Left-sided weakness and slurred speech. Stroke.  EXAM: MRI HEAD WITHOUT CONTRAST  MRA HEAD WITHOUT CONTRAST  TECHNIQUE: Multiplanar, multiecho pulse sequences of the brain and surrounding structures were obtained without intravenous contrast. Angiographic images of the head were obtained using MRA technique without contrast.  COMPARISON:  CT head without contrast 08/22/2013. MRI brain 05/20/2011.  FINDINGS: MRI HEAD FINDINGS  And an acute nonhemorrhagic infarct is present within the white matter of the posterior right frontal lobe along the cortical spinal tract. The area is linear, measuring 8 mm maximally. Extensive periventricular and subcortical white matter disease is otherwise stable. Remote lacunar infarcts are noted within  the thalami bilaterally. Additional lacunar infarcts are noted within the basilar ganglia as since the prior exam. These are not acute. Remote encephalomalacia of the left cerebellum is stable.  Flow is present in the major intracranial arteries. The globes and orbits are intact. And chronic left sphenoid opacification is present. The remaining paranasal sinuses and the mastoid air cells are clear.  MRA HEAD FINDINGS  The internal carotid arteries are within normal limits from the high cervical segments through the ICA termini bilaterally. The A1 and M1 segments are normal. The MCA bifurcations are intact. There is mild attenuation of MCA branch vessels bilaterally.  Signal loss in the proximal vertebral arteries bilaterally appears artifactual. There is focal stenosis of the distal left vertebral artery just proximal to the vertebrobasilar junction. The basilar artery is intact. The posterior cerebral arteries originate from the basilar tip with significant contribution from the right posterior communicating artery. There is may focal stenosis of the right P1 segment. There is significant signal loss in the P2 segments bilaterally, right greater than left.  IMPRESSION: MRI HEAD IMPRESSION  1. Acute nonhemorrhagic linear infarct within the cortical spinal tracts in the posterior right frontal lobe. 2. Lacunar infarcts in the basal ganglia bilaterally have progressed since the prior exams without evidence for other acute disease. 3.  Remote lacunar infarcts of the thalami bilaterally. 4. Remote encephalomalacia of the left cerebellum.  MRA HEAD IMPRESSION  1. Progressive small vessel disease. 2. Signal loss in the proximal posterior cerebral arteries bilaterally has progressed.   Electronically Signed   By: Lawrence Santiago M.D.   On: 08/22/2013 16:34   Mr Brain Wo Contrast  08/22/2013   CLINICAL DATA:  Left-sided weakness and slurred speech. Stroke.  EXAM: MRI HEAD WITHOUT CONTRAST  MRA HEAD WITHOUT CONTRAST   TECHNIQUE: Multiplanar, multiecho pulse sequences of the brain and surrounding structures were obtained without intravenous contrast. Angiographic images of the head were obtained using MRA technique without contrast.  COMPARISON:  CT head without contrast 08/22/2013. MRI brain 05/20/2011.  FINDINGS: MRI HEAD FINDINGS  And an acute nonhemorrhagic infarct is present within the white matter of the posterior right frontal lobe along the cortical spinal tract. The area is linear, measuring 8 mm maximally. Extensive periventricular and subcortical white matter disease is otherwise stable. Remote lacunar infarcts are noted within the thalami bilaterally. Additional lacunar infarcts are noted within the basilar ganglia as since the prior exam. These are not acute. Remote encephalomalacia of the left cerebellum is stable.  Flow is present in the major intracranial arteries. The globes and orbits are intact. And chronic left sphenoid opacification is present. The remaining paranasal sinuses and the mastoid air cells are clear.  MRA HEAD FINDINGS  The internal carotid arteries are within normal limits from the high cervical segments through the ICA termini bilaterally. The A1 and M1 segments are normal. The MCA bifurcations are intact. There is mild attenuation of MCA branch vessels bilaterally.  Signal loss in the proximal vertebral arteries bilaterally appears artifactual. There is focal stenosis of the distal left vertebral artery just proximal to the vertebrobasilar junction. The basilar artery is intact. The posterior cerebral arteries originate from the basilar tip with significant contribution from the right posterior communicating artery. There is may focal stenosis of the right P1 segment. There is significant signal loss in the P2 segments bilaterally, right greater than left.  IMPRESSION: MRI HEAD IMPRESSION  1. Acute nonhemorrhagic linear infarct within the cortical spinal tracts in the posterior right frontal  lobe. 2. Lacunar infarcts in the basal ganglia bilaterally have progressed since the prior exams without evidence for other acute disease. 3. Remote lacunar infarcts of the thalami bilaterally. 4. Remote encephalomalacia of the left cerebellum.  MRA HEAD IMPRESSION  1. Progressive small vessel disease. 2. Signal loss in the proximal posterior cerebral arteries bilaterally has progressed.   Electronically Signed   By: Lawrence Santiago M.D.   On: 08/22/2013 16:34   US Carotid Duplex Bilateral  08/22/2013   CLINICAL DATA:  Stroke-like symptoms  EXAM: BILATERAL CAROTID DUPLEX ULTRASOUND  TECHNIQUE: Pearline Cables scale imaging, color Doppler and duplex ultrasound were performed of bilateral carotid and vertebral arteries in the neck.  COMPARISON:  None.  FINDINGS: Criteria: Quantification of carotid stenosis is based on velocity parameters that correlate the residual internal carotid diameter with NASCET-based stenosis levels, using the diameter of the distal internal carotid lumen as the denominator for stenosis measurement.  The following velocity measurements were obtained:  RIGHT  ICA:  73/15 cm/sec  CCA:  Q000111Q cm/sec  SYSTOLIC ICA/CCA RATIO:  [id number]  DIASTOLIC ICA/CCA RATIO:  0000000  ECA:  116 cm/sec  LEFT  ICA:  67/10 cm/sec  CCA:  0000000 cm/sec  SYSTOLIC ICA/CCA RATIO:  0000000  DIASTOLIC ICA/CCA RATIO:  [id number]  ECA:  107 cm/sec  RIGHT CAROTID ARTERY: Mild plaque formation is noted. No focal hemodynamically significant stenosis is noted.  RIGHT VERTEBRAL ARTERY:  Antegrade in nature.  LEFT CAROTID ARTERY: Minimal plaque formation is noted. No focal hemodynamically significant stenosis is noted.  LEFT VERTEBRAL ARTERY:  Antegrade in nature.  IMPRESSION: No evidence of focal hemodynamically significant carotid stenosis.   Electronically Signed   By: Inez Catalina M.D.   On: 08/22/2013 15:07    Scheduled Meds: . amLODipine  10 mg Oral Daily  . aspirin  81 mg Oral Daily  . atorvastatin  40 mg Oral q1800  . brimonidine  1 drop  Both Eyes TID  . clopidogrel  75 mg Oral Daily  . dorzolamide-timolol  1 drop Both Eyes BID  . enoxaparin (LOVENOX) injection  40 mg Subcutaneous Q24H  . insulin aspart  0-20 Units Subcutaneous TID WC  . insulin aspart protamine- aspart  40 Units Subcutaneous BID WC  . latanoprost  1 drop Both Eyes QHS  . losartan  100 mg Oral Daily  . metoprolol  25 mg Oral BID  . pantoprazole  40 mg Oral Daily  . spironolactone  25 mg Oral Daily  . tamsulosin  0.4 mg Oral Daily   Continuous Infusions:   Principal Problem:   CVA (cerebral infarction) rule out CVA with left sided weakness Active Problems:   HTN (hypertension)   Diabetes mellitus type II, uncontrolled   PVD (peripheral vascular disease)   CAD (coronary artery disease)   Hyperlipidemia   Obesity   History of stroke   OSA (obstructive sleep apnea)   Left-sided weakness    Time spent: 40 mins    [facility] Pager [phone]. If 7PM-7AM, please contact night-coverage at [email or web address], password [facility] 08/23/2013, 2:53 PM  LOS: 1 day

## 2013-08-23 NOTE — Consult Note (Signed)
[place] A. Merlene Laughter, MD     [email or web address]          [name] is an 72 y.o. male.   ASSESSMENT/PLAN:  1. Recurrent small vessel ischemic strokes in a man with multiple risk factors. I will suggest that the patient be placed accommodation of aspirin and Plavix for 3 months. Subsequent, he should be placed on a single agent. He is continue with other risk factor reduction including blood pressure control and diabetes control. Weight reduction is also suggested.       The patient is a 72 year old right-handed black male who presents to 1 day history of weakness of the left upper extremity and left leg. There may be some numbness but weakness is the main complaint. The patient has had 2 other events in the past both involving the left side. The patient does not report any deterioration overnight. His symptoms has remained unchanged. The patient does not report headaches, dizziness, chest pain or short of breath at this time. The patient tells me that he was on Plavix and was compliant with this medication. He also has been compliant with other medications. The patient tells me that his blood sugars have been running in the 100s and is plus pressure also has been controlled recently. He does have multiple comorbidities as outlined below. The patient does have sleep apnea and has been compliant with his machine. The patient review of systems is otherwise unremarkable at this time.  GENERAL: This a pleasant morbidly obese man in no acute distress.  HEENT: Neck is supple and head is normocephalic atraumatic. Neck size is large.  ABDOMEN: soft  EXTREMITIES: No edema.  Vein harvesting scar left lower extremity.  BACK: Unremarkable.  SKIN: Normal by inspection.    MENTAL STATUS: Alert and oriented. Speech, language and cognition are generally intact. Judgment and insight normal.   CRANIAL NERVES: Pupils are equal, round and reactive to light and accommodation;  extra ocular movements are full, there is no significant nystagmus; visual fields are full; upper and lower facial muscles are normal in strength and symmetric, there is no flattening of the nasolabial folds; tongue is midline; uvula is midline; shoulder elevation is normal.  MOTOR: There is a mild left hemiparesis. Left deltoid 4/minus/5. Hand grip is weak is likely he had 3/5. Dorsiflexion 4/5 and the left hip flexion 4/5. Right side shows normal tone, bulk and strength. There is a left upper extremity pronator drift.  COORDINATION: Left finger to nose is normal, right finger to nose is normal, No rest tremor; no intention tremor; no postural tremor; no bradykinesia.  REFLEXES: Deep tendon reflexes are symmetrical and normal. Plantar responses are flexor bilaterally.   SENSATION: Normal to light touch.     Past Medical History  Diagnosis Date  . Hypertension   . Coronary artery disease   . Diabetes mellitus   . C. difficile diarrhea   . Peripheral vascular disease   . 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 Glaucoma   . GERD (gastroesophageal reflux disease)   . Stroke 2008  . Sleep apnea     Past Surgical History  Procedure Laterality Date  . Cholecystectomy    . Coronary artery bypass graft    . Thyroid surgery    . No past surgeries      Family History  Problem Relation Age of Onset  . Diabetes Mother   . Heart failure Mother   . Hypertension Mother   .  Diabetes Father   . Heart failure Father   . Hypertension Father   . Diabetes Sister   . Heart failure Sister   . Hypertension Sister   . Hyperlipidemia Sister   . Diabetes Brother   . Heart failure Brother   . Hypertension Brother     Social History:  reports that he has quit smoking. He has never used smokeless tobacco. He reports that he does not drink alcohol or use illicit drugs.  Allergies: No Known Allergies  Medications: Prior to Admission medications   Medication Sig  Start Date End Date Taking? Authorizing Provider  amLODipine (NORVASC) 10 MG tablet Take 10 mg by mouth daily.   Yes Historical Provider, MD  atorvastatin (LIPITOR) 80 MG tablet Take 40 mg by mouth daily.    Yes Historical Provider, MD  brimonidine (ALPHAGAN) 0.2 % ophthalmic solution Place 1 drop into both eyes 3 (three) times daily.   Yes Historical Provider, MD  clopidogrel (PLAVIX) 75 MG tablet Take 75 mg by mouth daily.   Yes Historical Provider, MD  dorzolamide-timolol (COSOPT) 22.3-6.8 MG/ML ophthalmic solution Place 1 drop into both eyes 2 (two) times daily.   Yes Historical Provider, MD  insulin aspart protamine-insulin aspart (NOVOLOG 70/30) (70-30) 100 UNIT/ML injection Inject into the skin 2 (two) times daily with a meal. Inject 40 units in AM and 50 unit in PM   Yes Historical Provider, MD  latanoprost (XALATAN) 0.005 % ophthalmic solution Place 1 drop into both eyes at bedtime.   Yes Historical Provider, MD  losartan (COZAAR) 100 MG tablet Take 100 mg by mouth daily.     Yes Historical Provider, MD  metFORMIN (GLUCOPHAGE) 1000 MG tablet Take 1,000 mg by mouth 2 (two) times daily with a meal.     Yes Historical Provider, MD  metoprolol (LOPRESSOR) 50 MG tablet Take 0.5 tablets (25 mg total) by mouth 2 (two) times daily. FOR HEART/BLOOD PRESSURE. HOLD FOR SYSTOLIC BLOOD PRESSURE LESS THAN 1OO/HEART RATE LESS THAN 60 05/20/11  Yes Annita Brod, MD  pantoprazole (PROTONIX) 40 MG tablet Take 40 mg by mouth daily.   Yes Historical Provider, MD  pioglitazone (ACTOS) 30 MG tablet Take 30 mg by mouth daily.    Yes Historical Provider, MD  spironolactone (ALDACTONE) 25 MG tablet Take 25 mg by mouth daily.     Yes Historical Provider, MD  tamsulosin (FLOMAX) 0.4 MG CAPS Take 0.4 mg by mouth daily.    Yes Historical Provider, MD    Scheduled Meds: . amLODipine  10 mg Oral Daily  . aspirin  81 mg Oral Daily  . atorvastatin  40 mg Oral q1800  . brimonidine  1 drop Both Eyes TID  .  clopidogrel  75 mg Oral Daily  . dorzolamide-timolol  1 drop Both Eyes BID  . enoxaparin (LOVENOX) injection  40 mg Subcutaneous Q24H  . insulin aspart  0-20 Units Subcutaneous TID WC  . insulin aspart protamine- aspart  40 Units Subcutaneous BID WC  . latanoprost  1 drop Both Eyes QHS  . losartan  100 mg Oral Daily  . metoprolol  25 mg Oral BID  . pantoprazole  40 mg Oral Daily  . spironolactone  25 mg Oral Daily  . tamsulosin  0.4 mg Oral Daily   Continuous Infusions: . sodium chloride 75 mL/hr at 08/23/13 0340   PRN Meds:.acetaminophen, acetaminophen, ondansetron (ZOFRAN) IV, senna-docusate   Blood pressure 124/74, pulse 52, temperature 97.6 F (36.4 C), temperature source Oral, resp. rate  18, height 5\' 8"  (1.727 m), weight 126.644 kg (279 lb 3.2 oz), SpO2 99.00%.   Results for orders placed during the hospital encounter of 08/22/13 (from the past 48 hour(s))  ETHANOL     Status: None   Collection Time    08/22/13 10:59 AM      Result Value Range   Alcohol, Ethyl (B) <11  0 - 11 mg/dL   Comment:            LOWEST DETECTABLE LIMIT FOR     SERUM ALCOHOL IS 11 mg/dL     FOR MEDICAL PURPOSES ONLY  PROTIME-INR     Status: None   Collection Time    08/22/13 10:59 AM      Result Value Range   Prothrombin Time 13.6  11.6 - 15.2 seconds   INR 1.06  0.00 - 1.49  APTT     Status: None   Collection Time    08/22/13 10:59 AM      Result Value Range   aPTT 27  24 - 37 seconds  CBC     Status: None   Collection Time    08/22/13 10:59 AM      Result Value Range   WBC 6.5  4.0 - 10.5 K/uL   RBC 4.58  4.22 - 5.81 MIL/uL   Hemoglobin 14.0  13.0 - 17.0 g/dL   HCT 42.6  39.0 - 52.0 %   MCV 93.0  78.0 - 100.0 fL   MCH 30.6  26.0 - 34.0 pg   MCHC 32.9  30.0 - 36.0 g/dL   RDW 13.7  11.5 - 15.5 %   Platelets 213  150 - 400 K/uL  DIFFERENTIAL     Status: None   Collection Time    08/22/13 10:59 AM      Result Value Range   Neutrophils Relative % 54  43 - 77 %   Neutro Abs 3.5   1.7 - 7.7 K/uL   Lymphocytes Relative 34  12 - 46 %   Lymphs Abs 2.2  0.7 - 4.0 K/uL   Monocytes Relative 11  3 - 12 %   Monocytes Absolute 0.7  0.1 - 1.0 K/uL   Eosinophils Relative 1  0 - 5 %   Eosinophils Absolute 0.1  0.0 - 0.7 K/uL   Basophils Relative 0  0 - 1 %   Basophils Absolute 0.0  0.0 - 0.1 K/uL  COMPREHENSIVE METABOLIC PANEL     Status: Abnormal   Collection Time    08/22/13 10:59 AM      Result Value Range   Sodium 142  135 - 145 mEq/L   Potassium 4.2  3.5 - 5.1 mEq/L   Chloride 103  96 - 112 mEq/L   CO2 29  19 - 32 mEq/L   Glucose, Bld 108 (*) 70 - 99 mg/dL   BUN 10  6 - 23 mg/dL   Creatinine, Ser 0.95  0.50 - 1.35 mg/dL   Calcium 9.4  8.4 - 10.5 mg/dL   Total Protein 7.7  6.0 - 8.3 g/dL   Albumin 3.6  3.5 - 5.2 g/dL   AST 21  0 - 37 U/L   ALT 20  0 - 53 U/L   Alkaline Phosphatase 55  39 - 117 U/L   Total Bilirubin 0.8  0.3 - 1.2 mg/dL   GFR calc non Af Amer 82 (*) >90 mL/min   GFR calc Af Amer >90  >90 mL/min  Comment: (NOTE)     The eGFR has been calculated using the CKD EPI equation.     This calculation has not been validated in all clinical situations.     eGFR's persistently <90 mL/min signify possible Chronic Kidney     Disease.  HEMOGLOBIN A1C     Status: Abnormal   Collection Time    08/22/13 10:59 AM      Result Value Range   Hemoglobin A1C 7.3 (*) <5.7 %   Comment: (NOTE)                                                                               According to the ADA Clinical Practice Recommendations for 2011, when     HbA1c is used as a screening test:      >=6.5%   Diagnostic of Diabetes Mellitus               (if abnormal result is confirmed)     5.7-6.4%   Increased risk of developing Diabetes Mellitus     References:Diagnosis and Classification of Diabetes Mellitus,Diabetes     [id number] 1):S62-S69 and Standards of Medical Care in             Diabetes - 2011,Diabetes Care,2011,34 (Suppl 1):S11-S61.   Mean Plasma Glucose 163  (*) <117 mg/dL   Comment: Performed at [facility], [facility]     Status: None   Collection Time    08/22/13 11:06 AM      Result Value Range   Glucose-Capillary 84  70 - 99 mg/dL  URINE RAPID DRUG SCREEN (HOSP PERFORMED)     Status: None   Collection Time    08/22/13 12:32 PM      Result Value Range   Opiates NONE DETECTED  NONE DETECTED   Cocaine NONE DETECTED  NONE DETECTED   Benzodiazepines NONE DETECTED  NONE DETECTED   Amphetamines NONE DETECTED  NONE DETECTED   Tetrahydrocannabinol NONE DETECTED  NONE DETECTED   Barbiturates NONE DETECTED  NONE DETECTED   Comment:            DRUG SCREEN FOR MEDICAL PURPOSES     ONLY.  IF CONFIRMATION IS NEEDED     FOR ANY PURPOSE, NOTIFY LAB     WITHIN 5 DAYS.                LOWEST DETECTABLE LIMITS     FOR URINE DRUG SCREEN     Drug Class       Cutoff (ng/mL)     Amphetamine      1000     Barbiturate      200     Benzodiazepine   [id number]     Tricyclics       [id number]     Opiates          300     Cocaine          300     THC              50  URINALYSIS, ROUTINE W REFLEX MICROSCOPIC     Status: None   Collection Time    08/22/13 12:32 PM  Result Value Range   Color, Urine YELLOW  YELLOW   APPearance CLEAR  CLEAR   Specific Gravity, Urine 1.020  1.005 - 1.030   pH 6.5  5.0 - 8.0   Glucose, UA NEGATIVE  NEGATIVE mg/dL   Hgb urine dipstick NEGATIVE  NEGATIVE   Bilirubin Urine NEGATIVE  NEGATIVE   Ketones, ur NEGATIVE  NEGATIVE mg/dL   Protein, ur NEGATIVE  NEGATIVE mg/dL   Urobilinogen, UA 0.2  0.0 - 1.0 mg/dL   Nitrite NEGATIVE  NEGATIVE   Leukocytes, UA NEGATIVE  NEGATIVE   Comment: MICROSCOPIC NOT DONE ON URINES WITH NEGATIVE PROTEIN, BLOOD, LEUKOCYTES, NITRITE, OR GLUCOSE <1000 mg/dL.  GLUCOSE, CAPILLARY     Status: Abnormal   Collection Time    08/22/13  4:38 PM      Result Value Range   Glucose-Capillary 113 (*) 70 - 99 mg/dL   Comment 1 Notify RN    GLUCOSE, CAPILLARY     Status: Abnormal   Collection  Time    08/22/13  9:48 PM      Result Value Range   Glucose-Capillary 112 (*) 70 - 99 mg/dL   Comment 1 Notify RN    LIPID PANEL     Status: Abnormal   Collection Time    08/23/13  5:37 AM      Result Value Range   Cholesterol 111  0 - 200 mg/dL   Triglycerides 77  <150 mg/dL   HDL 35 (*) >39 mg/dL   Total CHOL/HDL Ratio 3.2     VLDL 15  0 - 40 mg/dL   LDL Cholesterol 61  0 - 99 mg/dL   Comment:            Total Cholesterol/HDL:CHD Risk     Coronary Heart Disease Risk Table                         Men   Women      1/2 Average Risk   3.4   3.3      Average Risk       5.0   4.4      2 X Average Risk   9.6   7.1      3 X Average Risk  23.4   11.0                Use the calculated Patient Ratio     above and the CHD Risk Table     to determine the patient's CHD Risk.                ATP III CLASSIFICATION (LDL):      <100     mg/dL   Optimal      100-129  mg/dL   Near or Above                        Optimal      130-159  mg/dL   Borderline      160-189  mg/dL   High      >190     mg/dL   Very High  BASIC METABOLIC PANEL     Status: Abnormal   Collection Time    08/23/13  5:37 AM      Result Value Range   Sodium 140  135 - 145 mEq/L   Potassium 3.5  3.5 - 5.1 mEq/L   Chloride 105  96 - 112 mEq/L  CO2 27  19 - 32 mEq/L   Glucose, Bld 79  70 - 99 mg/dL   BUN 10  6 - 23 mg/dL   Creatinine, Ser 0.97  0.50 - 1.35 mg/dL   Calcium 9.1  8.4 - 10.5 mg/dL   GFR calc non Af Amer 82 (*) >90 mL/min   GFR calc Af Amer >90  >90 mL/min   Comment: (NOTE)     The eGFR has been calculated using the CKD EPI equation.     This calculation has not been validated in all clinical situations.     eGFR's persistently <90 mL/min signify possible Chronic Kidney     Disease.  CBC     Status: Abnormal   Collection Time    08/23/13  5:37 AM      Result Value Range   WBC 5.8  4.0 - 10.5 K/uL   RBC 4.32  4.22 - 5.81 MIL/uL   Hemoglobin 12.9 (*) 13.0 - 17.0 g/dL   HCT 39.9  39.0 - 52.0 %   MCV  92.4  78.0 - 100.0 fL   MCH 29.9  26.0 - 34.0 pg   MCHC 32.3  30.0 - 36.0 g/dL   RDW 13.6  11.5 - 15.5 %   Platelets 199  150 - 400 K/uL  GLUCOSE, CAPILLARY     Status: None   Collection Time    08/23/13  7:49 AM      Result Value Range   Glucose-Capillary 80  70 - 99 mg/dL    Dg Chest 2 View  08/22/2013   CLINICAL DATA:  Hypertension, diabetes, recent CVA  EXAM: CHEST  2 VIEW  COMPARISON:  10/01/2012  FINDINGS: The heart size and mediastinal contours are within normal limits. Both lungs are clear. The visualized skeletal structures are unremarkable. Patient is status post median sternotomy.  IMPRESSION: No active cardiopulmonary disease.   Electronically Signed   By: Margaree Mackintosh M.D.   On: 08/22/2013 16:13   Ct Head Wo Contrast  08/22/2013   CLINICAL DATA:  Left extremity weakness since 08/21/2013. Diabetic hypertensive patient with history of prior stroke 2009. No known injury  EXAM: CT HEAD WITHOUT CONTRAST  TECHNIQUE: Contiguous axial images were obtained from the base of the skull through the vertex without intravenous contrast.  COMPARISON:  05/20/2011 MR. 11/23/2008 CT.  FINDINGS: Left sphenoid sinus moderate mucosal thickening. Mild maxillary sinus mucosal thickening.  No intracranial hemorrhage.  Remote thalamic infarcts.  Small vessel disease type changes without CT evidence of large acute infarct.  Mild atrophy without hydrocephalus.  Vascular calcifications.  No intracranial mass lesion noted on this unenhanced exam.  IMPRESSION: Small vessel disease type changes without CT evidence of large acute infarct.  No intracranial hemorrhage.  Remote thalamic infarcts.  Left sphenoid sinus moderate mucosal thickening. Mild maxillary sinus mucosal thickening.   Electronically Signed   By: Chauncey Cruel M.D.   On: 08/22/2013 11:35   Mr Jodene Nam Head Wo Contrast  08/22/2013   CLINICAL DATA:  Left-sided weakness and slurred speech. Stroke.  EXAM: MRI HEAD WITHOUT CONTRAST  MRA HEAD WITHOUT  CONTRAST  TECHNIQUE: Multiplanar, multiecho pulse sequences of the brain and surrounding structures were obtained without intravenous contrast. Angiographic images of the head were obtained using MRA technique without contrast.  COMPARISON:  CT head without contrast 08/22/2013. MRI brain 05/20/2011.  FINDINGS: MRI HEAD FINDINGS  And an acute nonhemorrhagic infarct is present within the white matter of the posterior right frontal lobe along the  cortical spinal tract. The area is linear, measuring 8 mm maximally. Extensive periventricular and subcortical white matter disease is otherwise stable. Remote lacunar infarcts are noted within the thalami bilaterally. Additional lacunar infarcts are noted within the basilar ganglia as since the prior exam. These are not acute. Remote encephalomalacia of the left cerebellum is stable.  Flow is present in the major intracranial arteries. The globes and orbits are intact. And chronic left sphenoid opacification is present. The remaining paranasal sinuses and the mastoid air cells are clear.  MRA HEAD FINDINGS  The internal carotid arteries are within normal limits from the high cervical segments through the ICA termini bilaterally. The A1 and M1 segments are normal. The MCA bifurcations are intact. There is mild attenuation of MCA branch vessels bilaterally.  Signal loss in the proximal vertebral arteries bilaterally appears artifactual. There is focal stenosis of the distal left vertebral artery just proximal to the vertebrobasilar junction. The basilar artery is intact. The posterior cerebral arteries originate from the basilar tip with significant contribution from the right posterior communicating artery. There is may focal stenosis of the right P1 segment. There is significant signal loss in the P2 segments bilaterally, right greater than left.  IMPRESSION: MRI HEAD IMPRESSION  1. Acute nonhemorrhagic linear infarct within the cortical spinal tracts in the posterior right  frontal lobe. 2. Lacunar infarcts in the basal ganglia bilaterally have progressed since the prior exams without evidence for other acute disease. 3. Remote lacunar infarcts of the thalami bilaterally. 4. Remote encephalomalacia of the left cerebellum.  MRA HEAD IMPRESSION  1. Progressive small vessel disease. 2. Signal loss in the proximal posterior cerebral arteries bilaterally has progressed.   Electronically Signed   By: Lawrence Santiago M.D.   On: 08/22/2013 16:34   Mr Brain Wo Contrast  08/22/2013   CLINICAL DATA:  Left-sided weakness and slurred speech. Stroke.  EXAM: MRI HEAD WITHOUT CONTRAST  MRA HEAD WITHOUT CONTRAST  TECHNIQUE: Multiplanar, multiecho pulse sequences of the brain and surrounding structures were obtained without intravenous contrast. Angiographic images of the head were obtained using MRA technique without contrast.  COMPARISON:  CT head without contrast 08/22/2013. MRI brain 05/20/2011.  FINDINGS: MRI HEAD FINDINGS  And an acute nonhemorrhagic infarct is present within the white matter of the posterior right frontal lobe along the cortical spinal tract. The area is linear, measuring 8 mm maximally. Extensive periventricular and subcortical white matter disease is otherwise stable. Remote lacunar infarcts are noted within the thalami bilaterally. Additional lacunar infarcts are noted within the basilar ganglia as since the prior exam. These are not acute. Remote encephalomalacia of the left cerebellum is stable.  Flow is present in the major intracranial arteries. The globes and orbits are intact. And chronic left sphenoid opacification is present. The remaining paranasal sinuses and the mastoid air cells are clear.  MRA HEAD FINDINGS  The internal carotid arteries are within normal limits from the high cervical segments through the ICA termini bilaterally. The A1 and M1 segments are normal. The MCA bifurcations are intact. There is mild attenuation of MCA branch vessels bilaterally.   Signal loss in the proximal vertebral arteries bilaterally appears artifactual. There is focal stenosis of the distal left vertebral artery just proximal to the vertebrobasilar junction. The basilar artery is intact. The posterior cerebral arteries originate from the basilar tip with significant contribution from the right posterior communicating artery. There is may focal stenosis of the right P1 segment. There is significant signal loss in the P2  segments bilaterally, right greater than left.  IMPRESSION: MRI HEAD IMPRESSION  1. Acute nonhemorrhagic linear infarct within the cortical spinal tracts in the posterior right frontal lobe. 2. Lacunar infarcts in the basal ganglia bilaterally have progressed since the prior exams without evidence for other acute disease. 3. Remote lacunar infarcts of the thalami bilaterally. 4. Remote encephalomalacia of the left cerebellum.  MRA HEAD IMPRESSION  1. Progressive small vessel disease. 2. Signal loss in the proximal posterior cerebral arteries bilaterally has progressed.   Electronically Signed   By: Lawrence Santiago M.D.   On: 08/22/2013 16:34   US Carotid Duplex Bilateral  08/22/2013   CLINICAL DATA:  Stroke-like symptoms  EXAM: BILATERAL CAROTID DUPLEX ULTRASOUND  TECHNIQUE: Pearline Cables scale imaging, color Doppler and duplex ultrasound were performed of bilateral carotid and vertebral arteries in the neck.  COMPARISON:  None.  FINDINGS: Criteria: Quantification of carotid stenosis is based on velocity parameters that correlate the residual internal carotid diameter with NASCET-based stenosis levels, using the diameter of the distal internal carotid lumen as the denominator for stenosis measurement.  The following velocity measurements were obtained:  RIGHT  ICA:  73/15 cm/sec  CCA:  [id number] cm/sec  SYSTOLIC ICA/CCA RATIO:  [id number]  DIASTOLIC ICA/CCA RATIO:  0000000  ECA:  116 cm/sec  LEFT  ICA:  67/10 cm/sec  CCA:  0000000 cm/sec  SYSTOLIC ICA/CCA RATIO:  0000000  DIASTOLIC ICA/CCA  RATIO:  1.08  ECA:  107 cm/sec  RIGHT CAROTID ARTERY: Mild plaque formation is noted. No focal hemodynamically significant stenosis is noted.  RIGHT VERTEBRAL ARTERY:  Antegrade in nature.  LEFT CAROTID ARTERY: Minimal plaque formation is noted. No focal hemodynamically significant stenosis is noted.  LEFT VERTEBRAL ARTERY:  Antegrade in nature.  IMPRESSION: No evidence of focal hemodynamically significant carotid stenosis.   Electronically Signed   By: Inez Catalina M.D.   On: 08/22/2013 15:07        Tequlia Gonsalves A. Merlene Laughter, M.D.  Diplomate, [employer] of [employer]iatry and Neurology ( Neurology). 08/23/2013, 8:38 AM

## 2013-08-24 LAB — BASIC METABOLIC PANEL
BUN: 14 mg/dL (ref 6–23)
CO2: 27 mEq/L (ref 19–32)
Calcium: 9.1 mg/dL (ref 8.4–10.5)
Chloride: 106 mEq/L (ref 96–112)
Creatinine, Ser: 1.08 mg/dL (ref 0.50–1.35)
GFR calc Af Amer: 78 mL/min — ABNORMAL LOW (ref >90)
GFR calc non Af Amer: 68 mL/min — ABNORMAL LOW (ref >90)
Glucose, Bld: 98 mg/dL (ref 70–99)
Potassium: 3.8 mEq/L (ref 3.5–5.1)
Sodium: 141 mEq/L (ref 135–145)

## 2013-08-24 LAB — GLUCOSE, CAPILLARY
Glucose-Capillary: 110 mg/dL — ABNORMAL HIGH (ref 70–99)
Glucose-Capillary: 143 mg/dL — ABNORMAL HIGH (ref 70–99)

## 2013-08-24 MED ORDER — CLOPIDOGREL BISULFATE 75 MG PO TABS: 75.0000 mg | ORAL_TABLET | Freq: Every day | ORAL | Status: DC

## 2013-08-24 MED ORDER — ASPIRIN 81 MG PO CHEW: 81.0000 mg | CHEWABLE_TABLET | Freq: Every day | ORAL | Status: DC

## 2013-08-24 NOTE — Progress Notes (Signed)
Patient ID: [name], male   DOB: Apr 01, 1941, 72 y.o.   MRN: [id number]  Charles A. Merlene Laughter, MD     [email or web address]          [name] is an 72 y.o. male.   Assessment/Plan:  1. Acute lacunar infarct in a patient with the recurrent lacunar infarcts in multiple risk factors. Again, he is continue aspirin pose combination. Subsequently, he can be on a single antiplatelet agent.   The patient reports that his left hand is still a problem. The patient is awake and alert. He is lucid and coherent. The left side has improved in general except for the left hand which is still weak 3/5. Handgrip is 3 and wrist extension and also 3. Left deltoid is 5, left hip flexion 5 and left dorsiflexion 5.     Objective: Vital signs in last 24 hours: Temp:  [98 F (36.7 C)-98.5 F (36.9 C)] 98 F (36.7 C) (10/24 0501) Pulse Rate:  [55-62] 55 (10/24 0501) Resp:  [18] 18 (10/24 0501) BP: (125-143)/(66-72) 125/68 mmHg (10/24 0501) SpO2:  [97 %-100 %] 99 % (10/24 0501)  Intake/Output from previous day: 10/23 0701 - 10/24 0700 In: [id number] [P.O.:720; I.V.:850] Out: 1725 [Urine:1725] Intake/Output this shift:   Nutritional status: Carb Control   Lab Results: Results for orders placed during the hospital encounter of 08/22/13 (from the past 48 hour(s))  ETHANOL     Status: None   Collection Time    08/22/13 10:59 AM      Result Value Range   Alcohol, Ethyl (B) <11  0 - 11 mg/dL   Comment:            LOWEST DETECTABLE LIMIT FOR     SERUM ALCOHOL IS 11 mg/dL     FOR MEDICAL PURPOSES ONLY  PROTIME-INR     Status: None   Collection Time    08/22/13 10:59 AM      Result Value Range   Prothrombin Time 13.6  11.6 - 15.2 seconds   INR 1.06  0.00 - 1.49  APTT     Status: None   Collection Time    08/22/13 10:59 AM      Result Value Range   aPTT 27  24 - 37 seconds  CBC     Status: None   Collection Time    08/22/13 10:59 AM      Result Value Range   WBC 6.5   4.0 - 10.5 K/uL   RBC 4.58  4.22 - 5.81 MIL/uL   Hemoglobin 14.0  13.0 - 17.0 g/dL   HCT 42.6  39.0 - 52.0 %   MCV 93.0  78.0 - 100.0 fL   MCH 30.6  26.0 - 34.0 pg   MCHC 32.9  30.0 - 36.0 g/dL   RDW 13.7  11.5 - 15.5 %   Platelets 213  150 - 400 K/uL  DIFFERENTIAL     Status: None   Collection Time    08/22/13 10:59 AM      Result Value Range   Neutrophils Relative % 54  43 - 77 %   Neutro Abs 3.5  1.7 - 7.7 K/uL   Lymphocytes Relative 34  12 - 46 %   Lymphs Abs 2.2  0.7 - 4.0 K/uL   Monocytes Relative 11  3 - 12 %   Monocytes Absolute 0.7  0.1 - 1.0 K/uL   Eosinophils Relative 1  0 - 5 %  Eosinophils Absolute 0.1  0.0 - 0.7 K/uL   Basophils Relative 0  0 - 1 %   Basophils Absolute 0.0  0.0 - 0.1 K/uL  COMPREHENSIVE METABOLIC PANEL     Status: Abnormal   Collection Time    08/22/13 10:59 AM      Result Value Range   Sodium 142  135 - 145 mEq/L   Potassium 4.2  3.5 - 5.1 mEq/L   Chloride 103  96 - 112 mEq/L   CO2 29  19 - 32 mEq/L   Glucose, Bld 108 (*) 70 - 99 mg/dL   BUN 10  6 - 23 mg/dL   Creatinine, Ser 0.95  0.50 - 1.35 mg/dL   Calcium 9.4  8.4 - 10.5 mg/dL   Total Protein 7.7  6.0 - 8.3 g/dL   Albumin 3.6  3.5 - 5.2 g/dL   AST 21  0 - 37 U/L   ALT 20  0 - 53 U/L   Alkaline Phosphatase 55  39 - 117 U/L   Total Bilirubin 0.8  0.3 - 1.2 mg/dL   GFR calc non Af Amer 82 (*) >90 mL/min   GFR calc Af Amer >90  >90 mL/min   Comment: (NOTE)     The eGFR has been calculated using the CKD EPI equation.     This calculation has not been validated in all clinical situations.     eGFR's persistently <90 mL/min signify possible Chronic Kidney     Disease.  HEMOGLOBIN A1C     Status: Abnormal   Collection Time    08/22/13 10:59 AM      Result Value Range   Hemoglobin A1C 7.3 (*) <5.7 %   Comment: (NOTE)                                                                               According to the ADA Clinical Practice Recommendations for 2011, when     HbA1c is used as  a screening test:      >=6.5%   Diagnostic of Diabetes Mellitus               (if abnormal result is confirmed)     5.7-6.4%   Increased risk of developing Diabetes Mellitus     References:Diagnosis and Classification of Diabetes Mellitus,Diabetes     [id number] 1):S62-S69 and Standards of Medical Care in             Diabetes - 2011,Diabetes Care,2011,34 (Suppl 1):S11-S61.   Mean Plasma Glucose 163 (*) <117 mg/dL   Comment: Performed at [facility], [facility]     Status: None   Collection Time    08/22/13 11:06 AM      Result Value Range   Glucose-Capillary 84  70 - 99 mg/dL  URINE RAPID DRUG SCREEN (HOSP PERFORMED)     Status: None   Collection Time    08/22/13 12:32 PM      Result Value Range   Opiates NONE DETECTED  NONE DETECTED   Cocaine NONE DETECTED  NONE DETECTED   Benzodiazepines NONE DETECTED  NONE DETECTED   Amphetamines NONE DETECTED  NONE DETECTED   Tetrahydrocannabinol NONE  DETECTED  NONE DETECTED   Barbiturates NONE DETECTED  NONE DETECTED   Comment:            DRUG SCREEN FOR MEDICAL PURPOSES     ONLY.  IF CONFIRMATION IS NEEDED     FOR ANY PURPOSE, NOTIFY LAB     WITHIN 5 DAYS.                LOWEST DETECTABLE LIMITS     FOR URINE DRUG SCREEN     Drug Class       Cutoff (ng/mL)     Amphetamine      1000     Barbiturate      200     Benzodiazepine   [id number]     Tricyclics       [id number]     Opiates          300     Cocaine          300     THC              50  URINALYSIS, ROUTINE W REFLEX MICROSCOPIC     Status: None   Collection Time    08/22/13 12:32 PM      Result Value Range   Color, Urine YELLOW  YELLOW   APPearance CLEAR  CLEAR   Specific Gravity, Urine 1.020  1.005 - 1.030   pH 6.5  5.0 - 8.0   Glucose, UA NEGATIVE  NEGATIVE mg/dL   Hgb urine dipstick NEGATIVE  NEGATIVE   Bilirubin Urine NEGATIVE  NEGATIVE   Ketones, ur NEGATIVE  NEGATIVE mg/dL   Protein, ur NEGATIVE  NEGATIVE mg/dL   Urobilinogen, UA 0.2  0.0 - 1.0 mg/dL    Nitrite NEGATIVE  NEGATIVE   Leukocytes, UA NEGATIVE  NEGATIVE   Comment: MICROSCOPIC NOT DONE ON URINES WITH NEGATIVE PROTEIN, BLOOD, LEUKOCYTES, NITRITE, OR GLUCOSE <1000 mg/dL.  GLUCOSE, [facility]     Status: Abnormal   Collection Time    08/22/13  4:38 PM      Result Value Range   Glucose-Capillary 113 (*) 70 - 99 mg/dL   Comment 1 Notify RN    GLUCOSE, [facility]     Status: Abnormal   Collection Time    08/22/13  9:48 PM      Result Value Range   Glucose-Capillary 112 (*) 70 - 99 mg/dL   Comment 1 Notify RN    HEMOGLOBIN A1C     Status: Abnormal   Collection Time    08/23/13  5:37 AM      Result Value Range   Hemoglobin A1C 7.3 (*) <5.7 %   Comment: (NOTE)                                                                               According to the ADA Clinical Practice Recommendations for 2011, when     HbA1c is used as a screening test:      >=6.5%   Diagnostic of Diabetes Mellitus               (if abnormal result is confirmed)     5.7-6.4%   Increased risk of  developing Diabetes Mellitus     References:Diagnosis and Classification of Diabetes Mellitus,Diabetes     [id number] 1):S62-S69 and Standards of Medical Care in             Diabetes - 2011,Diabetes [id number] (Suppl 1):S11-S61.   Mean Plasma Glucose 163 (*) <117 mg/dL   Comment: Performed at [facility]: Abnormal   Collection Time    08/23/13  5:37 AM      Result Value Range   Cholesterol 111  0 - 200 mg/dL   Triglycerides 77  <150 mg/dL   HDL 35 (*) >39 mg/dL   Total CHOL/HDL Ratio 3.2     VLDL 15  0 - 40 mg/dL   LDL Cholesterol 61  0 - 99 mg/dL   Comment:            Total Cholesterol/HDL:CHD Risk     Coronary Heart Disease Risk Table                         Men   Women      1/2 Average Risk   3.4   3.3      Average Risk       5.0   4.4      2 X Average Risk   9.6   7.1      3 X Average Risk  23.4   11.0                Use the calculated Patient Ratio      above and the CHD Risk Table     to determine the patient's CHD Risk.                ATP III CLASSIFICATION (LDL):      <100     mg/dL   Optimal      100-129  mg/dL   Near or Above                        Optimal      130-159  mg/dL   Borderline      160-189  mg/dL   High      >190     mg/dL   Very High  BASIC METABOLIC PANEL     Status: Abnormal   Collection Time    08/23/13  5:37 AM      Result Value Range   Sodium 140  135 - 145 mEq/L   Potassium 3.5  3.5 - 5.1 mEq/L   Chloride 105  96 - 112 mEq/L   CO2 27  19 - 32 mEq/L   Glucose, Bld 79  70 - 99 mg/dL   BUN 10  6 - 23 mg/dL   Creatinine, Ser 0.97  0.50 - 1.35 mg/dL   Calcium 9.1  8.4 - 10.5 mg/dL   GFR calc non Af Amer 82 (*) >90 mL/min   GFR calc Af Amer >90  >90 mL/min   Comment: (NOTE)     The eGFR has been calculated using the CKD EPI equation.     This calculation has not been validated in all clinical situations.     eGFR's persistently <90 mL/min signify possible Chronic Kidney     Disease.  CBC     Status: Abnormal   Collection Time    08/23/13  5:37 AM      Result Value Range  WBC 5.8  4.0 - 10.5 K/uL   RBC 4.32  4.22 - 5.81 MIL/uL   Hemoglobin 12.9 (*) 13.0 - 17.0 g/dL   HCT 39.9  39.0 - 52.0 %   MCV 92.4  78.0 - 100.0 fL   MCH 29.9  26.0 - 34.0 pg   MCHC 32.3  30.0 - 36.0 g/dL   RDW 13.6  11.5 - 15.5 %   Platelets 199  150 - 400 K/uL  GLUCOSE, CAPILLARY     Status: None   Collection Time    08/23/13  7:49 AM      Result Value Range   Glucose-Capillary 80  70 - 99 mg/dL  GLUCOSE, CAPILLARY     Status: Abnormal   Collection Time    08/23/13 12:02 PM      Result Value Range   Glucose-Capillary 139 (*) 70 - 99 mg/dL   Comment 1 Notify RN    GLUCOSE, CAPILLARY     Status: Abnormal   Collection Time    08/23/13  4:52 PM      Result Value Range   Glucose-Capillary 129 (*) 70 - 99 mg/dL   Comment 1 Notify RN    GLUCOSE, CAPILLARY     Status: Abnormal   Collection Time    08/23/13  9:02 PM       Result Value Range   Glucose-Capillary 150 (*) 70 - 99 mg/dL   Comment 1 Notify RN    BASIC METABOLIC PANEL     Status: Abnormal   Collection Time    08/24/13  4:45 AM      Result Value Range   Sodium 141  135 - 145 mEq/L   Potassium 3.8  3.5 - 5.1 mEq/L   Chloride 106  96 - 112 mEq/L   CO2 27  19 - 32 mEq/L   Glucose, Bld 98  70 - 99 mg/dL   BUN 14  6 - 23 mg/dL   Creatinine, Ser 1.08  0.50 - 1.35 mg/dL   Calcium 9.1  8.4 - 10.5 mg/dL   GFR calc non Af Amer 68 (*) >90 mL/min   GFR calc Af Amer 78 (*) >90 mL/min   Comment: (NOTE)     The eGFR has been calculated using the CKD EPI equation.     This calculation has not been validated in all clinical situations.     eGFR's persistently <90 mL/min signify possible Chronic Kidney     Disease.  GLUCOSE, CAPILLARY     Status: Abnormal   Collection Time    08/24/13  8:03 AM      Result Value Range   Glucose-Capillary 110 (*) 70 - 99 mg/dL    Lipid Panel  Recent Labs  08/23/13 0537  CHOL 111  TRIG 77  HDL 35*  CHOLHDL 3.2  VLDL 15  LDLCALC 61    Studies/Results: Dg Chest 2 View  08/22/2013   CLINICAL DATA:  Hypertension, diabetes, recent CVA  EXAM: CHEST  2 VIEW  COMPARISON:  10/01/2012  FINDINGS: The heart size and mediastinal contours are within normal limits. Both lungs are clear. The visualized skeletal structures are unremarkable. Patient is status post median sternotomy.  IMPRESSION: No active cardiopulmonary disease.   Electronically Signed   By: Margaree Mackintosh M.D.   On: 08/22/2013 16:13   Ct Head Wo Contrast  08/22/2013   CLINICAL DATA:  Left extremity weakness since 08/21/2013. Diabetic hypertensive patient with history of prior stroke 2009. No known injury  EXAM: CT HEAD WITHOUT CONTRAST  TECHNIQUE: Contiguous axial images were obtained from the base of the skull through the vertex without intravenous contrast.  COMPARISON:  05/20/2011 MR. 11/23/2008 CT.  FINDINGS: Left sphenoid sinus moderate mucosal thickening.  Mild maxillary sinus mucosal thickening.  No intracranial hemorrhage.  Remote thalamic infarcts.  Small vessel disease type changes without CT evidence of large acute infarct.  Mild atrophy without hydrocephalus.  Vascular calcifications.  No intracranial mass lesion noted on this unenhanced exam.  IMPRESSION: Small vessel disease type changes without CT evidence of large acute infarct.  No intracranial hemorrhage.  Remote thalamic infarcts.  Left sphenoid sinus moderate mucosal thickening. Mild maxillary sinus mucosal thickening.   Electronically Signed   By: Chauncey Cruel M.D.   On: 08/22/2013 11:35   Mr Jodene Nam Head Wo Contrast  08/22/2013   CLINICAL DATA:  Left-sided weakness and slurred speech. Stroke.  EXAM: MRI HEAD WITHOUT CONTRAST  MRA HEAD WITHOUT CONTRAST  TECHNIQUE: Multiplanar, multiecho pulse sequences of the brain and surrounding structures were obtained without intravenous contrast. Angiographic images of the head were obtained using MRA technique without contrast.  COMPARISON:  CT head without contrast 08/22/2013. MRI brain 05/20/2011.  FINDINGS: MRI HEAD FINDINGS  And an acute nonhemorrhagic infarct is present within the white matter of the posterior right frontal lobe along the cortical spinal tract. The area is linear, measuring 8 mm maximally. Extensive periventricular and subcortical white matter disease is otherwise stable. Remote lacunar infarcts are noted within the thalami bilaterally. Additional lacunar infarcts are noted within the basilar ganglia as since the prior exam. These are not acute. Remote encephalomalacia of the left cerebellum is stable.  Flow is present in the major intracranial arteries. The globes and orbits are intact. And chronic left sphenoid opacification is present. The remaining paranasal sinuses and the mastoid air cells are clear.  MRA HEAD FINDINGS  The internal carotid arteries are within normal limits from the high cervical segments through the ICA termini  bilaterally. The A1 and M1 segments are normal. The MCA bifurcations are intact. There is mild attenuation of MCA branch vessels bilaterally.  Signal loss in the proximal vertebral arteries bilaterally appears artifactual. There is focal stenosis of the distal left vertebral artery just proximal to the vertebrobasilar junction. The basilar artery is intact. The posterior cerebral arteries originate from the basilar tip with significant contribution from the right posterior communicating artery. There is may focal stenosis of the right P1 segment. There is significant signal loss in the P2 segments bilaterally, right greater than left.  IMPRESSION: MRI HEAD IMPRESSION  1. Acute nonhemorrhagic linear infarct within the cortical spinal tracts in the posterior right frontal lobe. 2. Lacunar infarcts in the basal ganglia bilaterally have progressed since the prior exams without evidence for other acute disease. 3. Remote lacunar infarcts of the thalami bilaterally. 4. Remote encephalomalacia of the left cerebellum.  MRA HEAD IMPRESSION  1. Progressive small vessel disease. 2. Signal loss in the proximal posterior cerebral arteries bilaterally has progressed.   Electronically Signed   By: Lawrence Santiago M.D.   On: 08/22/2013 16:34   Mr Brain Wo Contrast  08/22/2013   CLINICAL DATA:  Left-sided weakness and slurred speech. Stroke.  EXAM: MRI HEAD WITHOUT CONTRAST  MRA HEAD WITHOUT CONTRAST  TECHNIQUE: Multiplanar, multiecho pulse sequences of the brain and surrounding structures were obtained without intravenous contrast. Angiographic images of the head were obtained using MRA technique without contrast.  COMPARISON:  CT head without contrast 08/22/2013.  MRI brain 05/20/2011.  FINDINGS: MRI HEAD FINDINGS  And an acute nonhemorrhagic infarct is present within the white matter of the posterior right frontal lobe along the cortical spinal tract. The area is linear, measuring 8 mm maximally. Extensive periventricular and  subcortical white matter disease is otherwise stable. Remote lacunar infarcts are noted within the thalami bilaterally. Additional lacunar infarcts are noted within the basilar ganglia as since the prior exam. These are not acute. Remote encephalomalacia of the left cerebellum is stable.  Flow is present in the major intracranial arteries. The globes and orbits are intact. And chronic left sphenoid opacification is present. The remaining paranasal sinuses and the mastoid air cells are clear.  MRA HEAD FINDINGS  The internal carotid arteries are within normal limits from the high cervical segments through the ICA termini bilaterally. The A1 and M1 segments are normal. The MCA bifurcations are intact. There is mild attenuation of MCA branch vessels bilaterally.  Signal loss in the proximal vertebral arteries bilaterally appears artifactual. There is focal stenosis of the distal left vertebral artery just proximal to the vertebrobasilar junction. The basilar artery is intact. The posterior cerebral arteries originate from the basilar tip with significant contribution from the right posterior communicating artery. There is may focal stenosis of the right P1 segment. There is significant signal loss in the P2 segments bilaterally, right greater than left.  IMPRESSION: MRI HEAD IMPRESSION  1. Acute nonhemorrhagic linear infarct within the cortical spinal tracts in the posterior right frontal lobe. 2. Lacunar infarcts in the basal ganglia bilaterally have progressed since the prior exams without evidence for other acute disease. 3. Remote lacunar infarcts of the thalami bilaterally. 4. Remote encephalomalacia of the left cerebellum.  MRA HEAD IMPRESSION  1. Progressive small vessel disease. 2. Signal loss in the proximal posterior cerebral arteries bilaterally has progressed.   Electronically Signed   By: Lawrence Santiago M.D.   On: 08/22/2013 16:34   US Carotid Duplex Bilateral  08/22/2013   CLINICAL DATA:  Stroke-like  symptoms  EXAM: BILATERAL CAROTID DUPLEX ULTRASOUND  TECHNIQUE: Pearline Cables scale imaging, color Doppler and duplex ultrasound were performed of bilateral carotid and vertebral arteries in the neck.  COMPARISON:  None.  FINDINGS: Criteria: Quantification of carotid stenosis is based on velocity parameters that correlate the residual internal carotid diameter with NASCET-based stenosis levels, using the diameter of the distal internal carotid lumen as the denominator for stenosis measurement.  The following velocity measurements were obtained:  RIGHT  ICA:  73/15 cm/sec  CCA:  [id number] cm/sec  SYSTOLIC ICA/CCA RATIO:  [id number]  DIASTOLIC ICA/CCA RATIO:  0000000  ECA:  116 cm/sec  LEFT  ICA:  67/10 cm/sec  CCA:  0000000 cm/sec  SYSTOLIC ICA/CCA RATIO:  0000000  DIASTOLIC ICA/CCA RATIO:  [id number]  ECA:  107 cm/sec  RIGHT CAROTID ARTERY: Mild plaque formation is noted. No focal hemodynamically significant stenosis is noted.  RIGHT VERTEBRAL ARTERY:  Antegrade in nature.  LEFT CAROTID ARTERY: Minimal plaque formation is noted. No focal hemodynamically significant stenosis is noted.  LEFT VERTEBRAL ARTERY:  Antegrade in nature.  IMPRESSION: No evidence of focal hemodynamically significant carotid stenosis.   Electronically Signed   By: Inez Catalina M.D.   On: 08/22/2013 15:07    Medications:  Scheduled Meds: . amLODipine  10 mg Oral Daily  . aspirin  81 mg Oral Daily  . atorvastatin  40 mg Oral q1800  . brimonidine  1 drop Both Eyes TID  . clopidogrel  75 mg  Oral Daily  . dorzolamide-timolol  1 drop Both Eyes BID  . enoxaparin (LOVENOX) injection  40 mg Subcutaneous Q24H  . insulin aspart  0-20 Units Subcutaneous TID WC  . insulin aspart protamine- aspart  40 Units Subcutaneous BID WC  . latanoprost  1 drop Both Eyes QHS  . losartan  100 mg Oral Daily  . metoprolol  25 mg Oral BID  . pantoprazole  40 mg Oral Daily  . sodium chloride  3 mL Intravenous Q12H  . spironolactone  25 mg Oral Daily  . tamsulosin  0.4 mg Oral Daily    Continuous Infusions:  PRN Meds:.sodium chloride, acetaminophen, acetaminophen, ondansetron (ZOFRAN) IV, senna-docusate, sodium chloride   ECHO  Study Conclusions  - Procedure narrative: Image quality was suboptimal, with poor sound transmission. - Left ventricle: Probable diastolic dysfuntion. Assessment was incomplete, thus unable to grade. Wall thickness was increased in a pattern of mild LVH. Systolic function was normal. The estimated ejection fraction was in the range of 55% to 60%. Images were inadequate for LV wall motion assessment. - Aortic valve: Trileaflet; mildly thickened, mildly calcified leaflets. There was no stenosis. No significant regurgitation. - Aorta: Mild aortic root dilatation. Aortic root dimension: 58mm (ED, M-mode). - Mitral valve: Calcified annulus. Mildly thickened leaflets . - Left atrium: The atrium was mildly dilated. - Tricuspid valve: Mild regurgitation.      LOS: 2 days   [name] A. Merlene [name], M.D.  Diplomate, [employer] adviser of Psychiatry and Neurology ( Neurology).

## 2013-08-24 NOTE — Discharge Summary (Signed)
Physician Discharge Summary  [name] [id number] DOB: [birth year] DOA: 08/22/2013  PCP: Vic Blackbird, MD  Admit date: 08/22/2013 Discharge date: 08/24/2013  Time spent: 65 minutes  Recommendations for Outpatient Follow-up:  1. Followup with Vic Blackbird, MD in one to 2 weeks. 2. Followup with Dr. Merlene Laughter in one month on 10/02/13.  Discharge Diagnoses:  Principal Problem:   CVA (cerebral infarction) rule out CVA with left sided weakness Active Problems:   HTN (hypertension)   Diabetes mellitus type II, uncontrolled   PVD (peripheral vascular disease)   CAD (coronary artery disease)   Hyperlipidemia   Obesity   History of stroke   OSA (obstructive sleep apnea)   Left-sided weakness   Discharge Condition: Stable  Diet recommendation: Heart healthy.  Filed Weights   08/22/13 1412  Weight: 126.644 kg (279 lb 3.2 oz)    History of present illness:  [name] is a 72 y.o. 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 Patient denies any numbness no tingling. Patient denies any facial droop, no slurred speech, no visual deficits. Patient denies any fever, no chills, no chest pain, no shortness of breath, no nausea, no vomiting, no abdominal pain, no diarrhea, no constipation. Patient denies any dysuria. Patient stated that symptoms did not resolve and he subsequently presented to the ED today.  In the ED CT of the head which was done was negative for any acute infarct. Comprehensive metabolic profile is unremarkable. CBC was unremarkable. EKG showed sinus bradycardia with first-degree AV block. Q waves in leads 2,3. Poor R wave progression.  ED physician stated he spoke to neurology who will be consulting on the  patient. We were called to admit the patient for further evaluation and management.  Due to late presentation patient was deemed not a TPA candidate.   Hospital Course:  #1 acute CVA  Patient presented with left upper extremity weakness. CT head was negative. MRI MRA of the head consistent with acute nonhemorrhagic linear infarct within the cortical spinal tracts in the posterior right frontal lobe. Lacunar infarcts in the basal ganglia bilaterally have progressed since prior exam without evidence of other acute disease. Remote lacunar infarcts of the thalami bilaterally. Remote encephalomalacia of the left cerebellum. Carotid Dopplers with no significant ICA stenosis. 2-D echo with no evidence of emboli. Patient has remained in normal sinus rhythm. Patient was initially started on Plavix and when MRI results came back positive for stroke baby aspirin was added to his regimen. Patient was seen by PT OT. Neurology consultation was also obtained and patient was seen in consultation by Dr. Merlene Laughter. It was recommended that patient remain on dual antiplatelet therapy x3 months and subsequently on a single agent. Patient remained stable was seen by physical therapy and will followup with outpatient physical therapy. Patient will also followup with neurology one month post discharge. #2 hypertension  Remained stable throughout the hospitalization. Patient was maintained on his home regimen of Norvasc, Cozaar, Lopressor, Aldactone, Flomax.  #3 type 2 diabetes  Hemoglobin A1c was 7.3. CBGs have ranged from 80-139. Continued on 70/30 and sliding scale during the hospitalization.  #4 hyperlipidemia  LDL at 61. Patient was maintained on a statin. #5 obstructive sleep apnea  CPAP each bedtime.  #6 coronary artery  disease  Stable. Continued on statin, Plavix, aspirin, Cozaar, Lopressor, Aldactone, Norvasc.     Procedures: CT head 08/22/2013  MRI MRA head 08/22/2013  2-D echo 08/23/2013  Carotid Dopplers  08/23/2013     Consultations: Neurology: Dr. Merlene Laughter 08/23/2013   Discharge Exam: Filed Vitals:   08/24/13 1032  BP:   Pulse: 64  Temp:   Resp:     General: NAD Cardiovascular: RRR Respiratory: CTAB Neuro: Some slight improvement in LUE weakness  Discharge Instructions      Discharge Orders   Future Appointments Provider Department Dept Phone   02/11/2014 10:00 AM Alycia Rossetti, MD [facility] [phone]   Future Orders Complete By Expires   Diet - low sodium heart healthy  As directed    Discharge instructions  As directed    Comments:     Follow up with Dr Merlene Laughter in 1 month. Follow up with PCP in 1-2 weeks.   Increase activity slowly  As directed        Medication List         amLODipine 10 MG tablet  Commonly known as:  NORVASC  Take 10 mg by mouth daily.     aspirin 81 MG chewable tablet  Chew 1 tablet (81 mg total) by mouth daily.     atorvastatin 80 MG tablet  Commonly known as:  LIPITOR  Take 40 mg by mouth daily.     brimonidine 0.2 % ophthalmic solution  Commonly known as:  ALPHAGAN  Place 1 drop into both eyes 3 (three) times daily.     clopidogrel 75 MG tablet  Commonly known as:  PLAVIX  Take 1 tablet (75 mg total) by mouth daily.     dorzolamide-timolol 22.3-6.8 MG/ML ophthalmic solution  Commonly known as:  COSOPT  Place 1 drop into both eyes 2 (two) times daily.     insulin aspart protamine- aspart (70-30) 100 UNIT/ML injection  Commonly known as:  NOVOLOG MIX 70/30  Inject into the skin 2 (two) times daily with a meal. Inject 40 units in AM and 50 unit in PM     latanoprost 0.005 % ophthalmic solution  Commonly known as:  XALATAN  Place 1 drop into both eyes at bedtime.     losartan 100 MG tablet  Commonly known as:  COZAAR  Take 100 mg by mouth daily.     metFORMIN 1000 MG tablet  Commonly known as:  GLUCOPHAGE  Take 1,000 mg by mouth 2 (two) times daily with a meal.     metoprolol 50 MG tablet   Commonly known as:  LOPRESSOR  Take 0.5 tablets (25 mg total) by mouth 2 (two) times daily. FOR HEART/BLOOD PRESSURE. HOLD FOR SYSTOLIC BLOOD PRESSURE LESS THAN 1OO/HEART RATE LESS THAN 60     pantoprazole 40 MG tablet  Commonly known as:  PROTONIX  Take 40 mg by mouth daily.     pioglitazone 30 MG tablet  Commonly known as:  ACTOS  Take 30 mg by mouth daily.     spironolactone 25 MG tablet  Commonly known as:  ALDACTONE  Take 25 mg by mouth daily.     tamsulosin 0.4 MG Caps capsule  Commonly known as:  FLOMAX  Take 0.4 mg by mouth daily.       No Known Allergies Follow-up Information   Follow up with [facility]. (PT and OT)    Contact information:   [address] [place] [address]  Follow up with Vic Blackbird, MD. Schedule an appointment as soon as possible for a visit in 1 week. (f/u in 1-2 weeks.)    Specialty:  Family Medicine   Contact information:   [id number] [place] [address] [phone]       Follow up with Phillips Odor, MD On 10/02/2013. (11:00)    Specialty:  Neurology   Contact information:   [address] [place] [id number] [phone]        The results of significant diagnostics from this hospitalization (including imaging, microbiology, ancillary and laboratory) are listed below for reference.    Significant Diagnostic Studies: Dg Chest 2 View  08/22/2013   CLINICAL DATA:  Hypertension, diabetes, recent CVA  EXAM: CHEST  2 VIEW  COMPARISON:  10/01/2012  FINDINGS: The heart size and mediastinal contours are within normal limits. Both lungs are clear. The visualized skeletal structures are unremarkable. Patient is status post median sternotomy.  IMPRESSION: No active cardiopulmonary disease.   Electronically Signed   By: Margaree Mackintosh M.D.   On: 08/22/2013 16:13   Ct Head Wo Contrast  08/22/2013   CLINICAL DATA:  Left extremity weakness since 08/21/2013. Diabetic hypertensive  patient with history of prior stroke 2009. No known injury  EXAM: CT HEAD WITHOUT CONTRAST  TECHNIQUE: Contiguous axial images were obtained from the base of the skull through the vertex without intravenous contrast.  COMPARISON:  05/20/2011 MR. 11/23/2008 CT.  FINDINGS: Left sphenoid sinus moderate mucosal thickening. Mild maxillary sinus mucosal thickening.  No intracranial hemorrhage.  Remote thalamic infarcts.  Small vessel disease type changes without CT evidence of large acute infarct.  Mild atrophy without hydrocephalus.  Vascular calcifications.  No intracranial mass lesion noted on this unenhanced exam.  IMPRESSION: Small vessel disease type changes without CT evidence of large acute infarct.  No intracranial hemorrhage.  Remote thalamic infarcts.  Left sphenoid sinus moderate mucosal thickening. Mild maxillary sinus mucosal thickening.   Electronically Signed   By: Chauncey Cruel M.D.   On: 08/22/2013 11:35   Mr Jodene Nam Head Wo Contrast  08/22/2013   CLINICAL DATA:  Left-sided weakness and slurred speech. Stroke.  EXAM: MRI HEAD WITHOUT CONTRAST  MRA HEAD WITHOUT CONTRAST  TECHNIQUE: Multiplanar, multiecho pulse sequences of the brain and surrounding structures were obtained without intravenous contrast. Angiographic images of the head were obtained using MRA technique without contrast.  COMPARISON:  CT head without contrast 08/22/2013. MRI brain 05/20/2011.  FINDINGS: MRI HEAD FINDINGS  And an acute nonhemorrhagic infarct is present within the white matter of the posterior right frontal lobe along the cortical spinal tract. The area is linear, measuring 8 mm maximally. Extensive periventricular and subcortical white matter disease is otherwise stable. Remote lacunar infarcts are noted within the thalami bilaterally. Additional lacunar infarcts are noted within the basilar ganglia as since the prior exam. These are not acute. Remote encephalomalacia of the left cerebellum is stable.  Flow is present in the  major intracranial arteries. The globes and orbits are intact. And chronic left sphenoid opacification is present. The remaining paranasal sinuses and the mastoid air cells are clear.  MRA HEAD FINDINGS  The internal carotid arteries are within normal limits from the high cervical segments through the ICA termini bilaterally. The A1 and M1 segments are normal. The MCA bifurcations are intact. There is mild attenuation of MCA branch vessels bilaterally.  Signal loss in the proximal vertebral arteries bilaterally appears artifactual. There is focal stenosis of the  distal left vertebral artery just proximal to the vertebrobasilar junction. The basilar artery is intact. The posterior cerebral arteries originate from the basilar tip with significant contribution from the right posterior communicating artery. There is may focal stenosis of the right P1 segment. There is significant signal loss in the P2 segments bilaterally, right greater than left.  IMPRESSION: MRI HEAD IMPRESSION  1. Acute nonhemorrhagic linear infarct within the cortical spinal tracts in the posterior right frontal lobe. 2. Lacunar infarcts in the basal ganglia bilaterally have progressed since the prior exams without evidence for other acute disease. 3. Remote lacunar infarcts of the thalami bilaterally. 4. Remote encephalomalacia of the left cerebellum.  MRA HEAD IMPRESSION  1. Progressive small vessel disease. 2. Signal loss in the proximal posterior cerebral arteries bilaterally has progressed.   Electronically Signed   By: Lawrence Santiago M.D.   On: 08/22/2013 16:34   Mr Brain Wo Contrast  08/22/2013   CLINICAL DATA:  Left-sided weakness and slurred speech. Stroke.  EXAM: MRI HEAD WITHOUT CONTRAST  MRA HEAD WITHOUT CONTRAST  TECHNIQUE: Multiplanar, multiecho pulse sequences of the brain and surrounding structures were obtained without intravenous contrast. Angiographic images of the head were obtained using MRA technique without contrast.   COMPARISON:  CT head without contrast 08/22/2013. MRI brain 05/20/2011.  FINDINGS: MRI HEAD FINDINGS  And an acute nonhemorrhagic infarct is present within the white matter of the posterior right frontal lobe along the cortical spinal tract. The area is linear, measuring 8 mm maximally. Extensive periventricular and subcortical white matter disease is otherwise stable. Remote lacunar infarcts are noted within the thalami bilaterally. Additional lacunar infarcts are noted within the basilar ganglia as since the prior exam. These are not acute. Remote encephalomalacia of the left cerebellum is stable.  Flow is present in the major intracranial arteries. The globes and orbits are intact. And chronic left sphenoid opacification is present. The remaining paranasal sinuses and the mastoid air cells are clear.  MRA HEAD FINDINGS  The internal carotid arteries are within normal limits from the high cervical segments through the ICA termini bilaterally. The A1 and M1 segments are normal. The MCA bifurcations are intact. There is mild attenuation of MCA branch vessels bilaterally.  Signal loss in the proximal vertebral arteries bilaterally appears artifactual. There is focal stenosis of the distal left vertebral artery just proximal to the vertebrobasilar junction. The basilar artery is intact. The posterior cerebral arteries originate from the basilar tip with significant contribution from the right posterior communicating artery. There is may focal stenosis of the right P1 segment. There is significant signal loss in the P2 segments bilaterally, right greater than left.  IMPRESSION: MRI HEAD IMPRESSION  1. Acute nonhemorrhagic linear infarct within the cortical spinal tracts in the posterior right frontal lobe. 2. Lacunar infarcts in the basal ganglia bilaterally have progressed since the prior exams without evidence for other acute disease. 3. Remote lacunar infarcts of the thalami bilaterally. 4. Remote encephalomalacia  of the left cerebellum.  MRA HEAD IMPRESSION  1. Progressive small vessel disease. 2. Signal loss in the proximal posterior cerebral arteries bilaterally has progressed.   Electronically Signed   By: Lawrence Santiago M.D.   On: 08/22/2013 16:34   US Carotid Duplex Bilateral  08/22/2013   CLINICAL DATA:  Stroke-like symptoms  EXAM: BILATERAL CAROTID DUPLEX ULTRASOUND  TECHNIQUE: Pearline Cables scale imaging, color Doppler and duplex ultrasound were performed of bilateral carotid and vertebral arteries in the neck.  COMPARISON:  None.  FINDINGS: Criteria:  Quantification of carotid stenosis is based on velocity parameters that correlate the residual internal carotid diameter with NASCET-based stenosis levels, using the diameter of the distal internal carotid lumen as the denominator for stenosis measurement.  The following velocity measurements were obtained:  RIGHT  ICA:  73/15 cm/sec  CCA:  Q000111Q cm/sec  SYSTOLIC ICA/CCA RATIO:  [id number]  DIASTOLIC ICA/CCA RATIO:  0000000  ECA:  116 cm/sec  LEFT  ICA:  67/10 cm/sec  CCA:  0000000 cm/sec  SYSTOLIC ICA/CCA RATIO:  0000000  DIASTOLIC ICA/CCA RATIO:  [id number]  ECA:  107 cm/sec  RIGHT CAROTID ARTERY: Mild plaque formation is noted. No focal hemodynamically significant stenosis is noted.  RIGHT VERTEBRAL ARTERY:  Antegrade in nature.  LEFT CAROTID ARTERY: Minimal plaque formation is noted. No focal hemodynamically significant stenosis is noted.  LEFT VERTEBRAL ARTERY:  Antegrade in nature.  IMPRESSION: No evidence of focal hemodynamically significant carotid stenosis.   Electronically Signed   By: Inez Catalina M.D.   On: 08/22/2013 15:07    Microbiology: No results found for this or any previous visit (from the past 240 hour(s)).   Labs: Basic Metabolic Panel:  Recent Labs Lab 08/22/13 1059 08/23/13 0537 08/24/13 0445  NA 142 140 141  K 4.2 3.5 3.8  CL 103 105 106  CO2 29 27 27   GLUCOSE 108* 79 98  BUN 10 10 14   CREATININE 0.95 0.97 1.08  CALCIUM 9.4 9.1 9.1   Liver  Function Tests:  Recent Labs Lab 08/22/13 1059  AST 21  ALT 20  ALKPHOS 55  BILITOT 0.8  PROT 7.7  ALBUMIN 3.6   No results found for this basename: LIPASE, AMYLASE,  in the last 168 hours No results found for this basename: AMMONIA,  in the last 168 hours CBC:  Recent Labs Lab 08/22/13 1059 08/23/13 0537  WBC 6.5 5.8  NEUTROABS 3.5  --   HGB 14.0 12.9*  HCT 42.6 39.9  MCV 93.0 92.4  PLT 213 199   Cardiac Enzymes: No results found for this basename: CKTOTAL, CKMB, CKMBINDEX, TROPONINI,  in the last 168 hours BNP: BNP (last 3 results) No results found for this basename: PROBNP,  in the last 8760 hours CBG:  Recent Labs Lab 08/23/13 1202 08/23/13 1652 08/23/13 2102 08/24/13 0803 08/24/13 1141  GLUCAP 139* 129* 150* 110* 143*       Signed:  THOMPSON,DANIEL  [facility] 08/24/2013, 1:22 PM

## 2013-08-24 NOTE — Progress Notes (Addendum)
Patient discharged home.  OP rehab to call patient and set up visit.  F/U appt in place with Neuro - pt will call to make appt for PCPs in 1-2 weeks.  Patient instructed to begin taking 81 mg ASA daily to prevent further stroke. Also to continue taking previous meds, statin included.  Pt given education on stroke. Verbalizes understanding of DC instructions.  Also educated on diabetic diet and discussed  A1C results.  No questions at this time. Stable to DC home.  Left floor via WC with NT

## 2013-08-24 NOTE — Evaluation (Signed)
Speech Language Pathology Evaluation Patient Details Name: [name] MRN: [id number] DOB: [birth year] Today's Date: 08/24/2013 Time: 0905-0920 SLP Time Calculation (min): 15 min  Problem List:  Patient Active Problem List   Diagnosis Date Noted  . CVA (cerebral infarction) rule out CVA with left sided weakness 08/22/2013  . Left-sided weakness 08/22/2013  . Tinea pedis 04/12/2013  . OSA (obstructive sleep apnea) 04/12/2013  . Diabetes mellitus type II, uncontrolled 11/28/2012  . PVD (peripheral vascular disease) 11/28/2012  . CAD (coronary artery disease) 11/28/2012  . Hyperlipidemia 11/28/2012  . Obesity 11/28/2012  . History of stroke 11/28/2012  . OAB (overactive bladder) 11/28/2012  . HTN (hypertension) 05/20/2011   Past Medical History:  Past Medical History  Diagnosis Date  . Hypertension   . Coronary artery disease   . Diabetes mellitus   . C. difficile diarrhea   . Peripheral vascular disease   . 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 Glaucoma   . GERD (gastroesophageal reflux disease)   . Stroke 2008  . Sleep apnea    Past Surgical History:  Past Surgical History  Procedure Laterality Date  . Cholecystectomy    . Coronary artery bypass graft    . Thyroid surgery    . No past surgeries     HPI:  [name] is a 72 y.o. male with h/o 2 strokes (most recent 2011) who presents to the Emergency Department complaining of left-sided extremity weakness that began yesterday afternoon and worsened this morning.  Pt denies any specific precipitating factors and states he does not know what he was doing when his symptoms began.  He states that his weakness is worse in his arm than his leg.  He denies numbness.  Per wife, pt has left-sided weakness residual to his most recent stroke in 2011, but he mostly recovered and has very minimal weakness at baseline.  Pt denies HA, SOB, CP, visual disturbance, difficulty swallowing, aphasia, fevers,  vomiting, abdominal pain, urinary symptoms, cough, or any other associated symptoms.  He is on Plavix.  He does not take aspirin.   Assessment / Plan / Recommendation Clinical Impression  Pt seen at bedside for speech/language evaluation. Pt pleasant and cooperative. Pt able to follow multi-step commands, demonstrate adequate LTM, STM, and immediate memory abilities, problem solve, and orient self without difficulty. Pt also demonstrated WNL pragmatic abilities. No further SLP services recommended at this time.     SLP Assessment  Patient does not need any further Speech Lanaguage Pathology Services    Follow Up Recommendations  None    Frequency and Duration   N/A     Pertinent Vitals/Pain No pain reported.   SLP Goals   N/A  SLP Evaluation Prior Functioning  Type of Home: House Available Help at Discharge: Family   Cognition  Overall Cognitive Status: Within Functional Limits for tasks assessed Arousal/Alertness: Awake/alert Orientation Level: Oriented X4 Memory: Appears intact Awareness: Appears intact Problem Solving: Appears intact Reasoning: Appears intact Safety/Judgment: Appears intact    Comprehension  Auditory Comprehension Overall Auditory Comprehension: Appears within functional limits for tasks assessed Yes/No Questions: Within Functional Limits Commands: Within Functional Limits Visual Recognition/Discrimination Discrimination: Within Function Limits Reading Comprehension Reading Status: Not tested    Expression Expression Primary Mode of Expression: Verbal Verbal Expression Overall Verbal Expression: Appears within functional limits for tasks assessed   Oral / Motor Oral Motor/Sensory Function Overall Oral Motor/Sensory Function: Appears within functional limits for tasks assessed Motor Speech Overall  Motor Speech: Appears within functional limits for tasks assessed   GO     [name] S 08/24/2013, 9:57 AM

## 2013-08-27 ENCOUNTER — Telehealth: Payer: Self-pay | Admitting: Family Medicine

## 2013-08-27 NOTE — Telephone Encounter (Signed)
Spoke with wife, recent CVA with Left hemparesis Has PT pending, pt has appt on Wed, if PT not set up will address at that time Is regaining some use in his left hand

## 2013-08-29 ENCOUNTER — Encounter: Payer: Self-pay | Admitting: Family Medicine

## 2013-08-29 ENCOUNTER — Ambulatory Visit (INDEPENDENT_AMBULATORY_CARE_PROVIDER_SITE_OTHER): Payer: Medicare Other | Admitting: Family Medicine

## 2013-08-29 VITALS — BP 130/70 | HR 82 | Temp 98.2°F | Resp 18 | Wt 284.0 lb

## 2013-08-29 DIAGNOSIS — I2510 Atherosclerotic heart disease of native coronary artery without angina pectoris: Secondary | ICD-10-CM

## 2013-08-29 DIAGNOSIS — IMO0001 Reserved for inherently not codable concepts without codable children: Secondary | ICD-10-CM

## 2013-08-29 DIAGNOSIS — I6350 Cerebral infarction due to unspecified occlusion or stenosis of unspecified cerebral artery: Secondary | ICD-10-CM

## 2013-08-29 DIAGNOSIS — E1165 Type 2 diabetes mellitus with hyperglycemia: Secondary | ICD-10-CM

## 2013-08-29 DIAGNOSIS — IMO0002 Reserved for concepts with insufficient information to code with codable children: Secondary | ICD-10-CM

## 2013-08-29 DIAGNOSIS — I639 Cerebral infarction, unspecified: Secondary | ICD-10-CM

## 2013-08-29 DIAGNOSIS — I10 Essential (primary) hypertension: Secondary | ICD-10-CM

## 2013-08-29 NOTE — Patient Instructions (Signed)
F/U as previous  Continue current medications  

## 2013-09-01 DIAGNOSIS — I639 Cerebral infarction, unspecified: Secondary | ICD-10-CM | POA: Insufficient documentation

## 2013-09-01 NOTE — Assessment & Plan Note (Signed)
This is his 3rd CVA, he has regained some function in Left side Continue to keep risk factors under control F/U neurology PT [facility]

## 2013-09-01 NOTE — Progress Notes (Signed)
  Subjective:    Patient ID: [name], male    DOB: Dec 16, 1940, 72 y.o.   MRN: [id number]  HPI  Pt here to f/u admission for CVA. Was admitted after > 24 hours of weakness and inablity to move LUE and weakness in LLE. New CVA found on MRI, neurology consulted started on plavix and aspirin therapy. HTN AND DM was well controlled during stay.He will start PT Monday. He has gained strength and movement in his left arm, now able to raise above his hand, able to grip and squeeze. His left foot still drags some. Denies any pain. Meds reviewed Hospital discharge reviewed  Review of Systems   GEN- denies fatigue, fever, weight loss,+weakness, recent illness HEENT- denies eye drainage, change in vision, nasal discharge, CVS- denies chest pain, palpitations RESP- denies SOB, cough, wheeze ABD- denies N/V, change in stools, abd pain GU- denies dysuria, hematuria, dribbling, incontinence MSK- denies joint pain, muscle aches, injury Neuro- denies headache, dizziness, syncope, seizure activity      Objective:   Physical Exam   GEN- NAD, alert and oriented x3 HEENT- PERRL, EOMI, non injected sclera, pink conjunctiva, MMM, oropharynx clear Neck- Supple, no thryomegaly CVS- RRR, no murmur RESP-CTAB EXT- No edema Pulses- Radial 2+, DP- decreaesd Bilat NEURO-CNII-XII in tact, weakness LUE compared to right, decreased grasp left hand, LE- decreased strength LLE, no foot drop seen , but shuffles left foot compares to right       Assessment & Plan:

## 2013-09-01 NOTE — Assessment & Plan Note (Signed)
Well controlled 

## 2013-09-01 NOTE — Assessment & Plan Note (Signed)
A1C 6.8% last week, note lab was repeated during his admission was 7.3% No change to insulin dose

## 2013-09-01 NOTE — Assessment & Plan Note (Signed)
Wife was worried about his CAD, has not had CP or SOB Advised that cardiology not likely to do cath without symptoms but we can arrange f/u As [name] did Cath and Open heart surgery, they preferred to discuss with [place] physician

## 2013-09-04 ENCOUNTER — Ambulatory Visit (HOSPITAL_COMMUNITY)
Admission: RE | Admit: 2013-09-04 | Discharge: 2013-09-04 | Disposition: A | Discharge status: Home / Self Care | Payer: Medicare Other | Source: Ambulatory Visit | Attending: Family Medicine | Admitting: Family Medicine

## 2013-09-04 DIAGNOSIS — I69998 Other sequelae following unspecified cerebrovascular disease: Secondary | ICD-10-CM | POA: Insufficient documentation

## 2013-09-04 DIAGNOSIS — E119 Type 2 diabetes mellitus without complications: Secondary | ICD-10-CM | POA: Insufficient documentation

## 2013-09-04 DIAGNOSIS — I10 Essential (primary) hypertension: Secondary | ICD-10-CM | POA: Insufficient documentation

## 2013-09-04 DIAGNOSIS — M6281 Muscle weakness (generalized): Secondary | ICD-10-CM | POA: Insufficient documentation

## 2013-09-04 DIAGNOSIS — IMO0001 Reserved for inherently not codable concepts without codable children: Secondary | ICD-10-CM | POA: Insufficient documentation

## 2013-09-04 DIAGNOSIS — R269 Unspecified abnormalities of gait and mobility: Secondary | ICD-10-CM | POA: Insufficient documentation

## 2013-09-06 ENCOUNTER — Ambulatory Visit (HOSPITAL_COMMUNITY)
Admission: RE | Admit: 2013-09-06 | Discharge: 2013-09-06 | Disposition: A | Discharge status: Home / Self Care | Payer: Medicare Other | Source: Ambulatory Visit | Attending: Family Medicine | Admitting: Family Medicine

## 2013-09-06 DIAGNOSIS — R29898 Other symptoms and signs involving the musculoskeletal system: Secondary | ICD-10-CM

## 2013-09-06 NOTE — Evaluation (Signed)
outPhysical Therapy Evaluation  Patient Details  Name: [name] MRN: [id number] Date of Birth: 1940-11-21  Today's Date: 09/06/2013 Time: 1350-1420 PT Time Calculation (min): 30 min Charge:  evaluation             Visit#: 1 of 1  Re-eval:   Assessment Diagnosis: CVA  Authorization: [employer] medicare    Past Medical History:  Past Medical History  Diagnosis Date  . Hypertension   . Coronary artery disease   . Diabetes mellitus   . C. difficile diarrhea   . Peripheral vascular disease   . 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 Glaucoma   . GERD (gastroesophageal reflux disease)   . Stroke 2008  . Sleep apnea    Past Surgical History:  Past Surgical History  Procedure Laterality Date  . Cholecystectomy    . Coronary artery bypass graft    . Thyroid surgery    . No past surgeries      Subjective Symptoms/Limitations Symptoms: [name] states that he had a stroke on 10/22 but feels as if he is close to prior level.  He is being referred to therapy to make sure he is at his maximum funcitonal level How long can you sit comfortably?: no problem How long can you stand comfortably?: no problem How long can you walk comfortably?: walk every day.  Close to 1/4 mile today.  Urged pt to be walking one mile a day but to ramp up gradually. Pain Assessment Currently in Pain?: No/denies Assessment LLE Strength Left Hip Flexion: 4/5 Left Hip Extension: 3-/5 Left Hip ABduction:  (4+) Left Hip ADduction: 4/5 Left Knee Flexion: 5/5 Left Knee Extension: 5/5 Left Ankle Dorsiflexion:  (4+) Left Ankle Plantar Flexion:  (4-/5) Left Ankle Inversion:  (4-/5) Left Ankle Eversion:  (4-/5)  Exercise/Treatments Mobility/Balance  Static Standing Balance Single Leg Stance - Right Leg: 4 Single Leg Stance - Left Leg: 4     Standing SLS with Vectors: x3  Supine Hip Adduction Isometric: 5 reps Bridges: 5 reps Straight Leg Raises: 5 reps Other Supine  Knee Exercises: ankle isometric ex x 5    Physical Therapy Assessment and Plan PT Assessment and Plan Clinical Impression Statement: Pt with hx of CVA affecting his Lt side.  Pt states that he is at full functional level and is having no difficulty at home.  MM test shows decreased mm strength in ankle and hip mm; pt also demonsrates decreased balance. Pt will benefit from skilled therapeutic intervention in order to improve on the following deficits: Decreased strength;Decreased balance PT Plan: Pt given HEP will complete exercise at home we will not pick [name] up as he is not in need of skilled care at this time.    Goals Home Exercise Program Pt/caregiver will Perform Home Exercise Program: For increased strengthening PT Goal: Perform Home Exercise Program - Progress: Goal set today  Problem List Patient Active Problem List   Diagnosis Date Noted  . Left leg weakness 09/06/2013  . CVA (cerebral infarction) 09/01/2013  . Left-sided weakness 08/22/2013  . Tinea pedis 04/12/2013  . OSA (obstructive sleep apnea) 04/12/2013  . Diabetes mellitus type II, uncontrolled 11/28/2012  . PVD (peripheral vascular disease) 11/28/2012  . CAD (coronary artery disease) 11/28/2012  . Hyperlipidemia 11/28/2012  . Obesity 11/28/2012  . History of stroke 11/28/2012  . OAB (overactive bladder) 11/28/2012  . HTN (hypertension) 05/20/2011    PT Plan of Care PT Home Exercise Plan: given  GP Functional Limitation: Mobility: Walking and moving around Mobility: Walking and Moving Around Current Status [phone]): At least 1 percent but less than 20 percent impaired, limited or restricted Mobility: Walking and Moving Around Goal Status [phone]): At least 1 percent but less than 20 percent impaired, limited or restricted Mobility: Walking and Moving Around Discharge Status [phone]): At least 1 percent but less than 20 percent impaired, limited or restricted  RUSSELL,CINDY 09/06/2013, 4:45  PM  Physician Documentation Your signature is required to indicate approval of the treatment plan as stated above.  Please sign and either send electronically or make a copy of this report for your files and return this physician signed original.   Please mark one 1.__approve of plan  2. ___approve of plan with the following conditions.   ______________________________                                                          _____________________ Physician Signature                                                                                                             Date

## 2013-09-07 NOTE — Evaluation (Signed)
Noted, will d/c OT at this time Agree with above

## 2013-11-20 ENCOUNTER — Inpatient Hospital Stay (HOSPITAL_COMMUNITY)
Admission: EM | Admit: 2013-11-20 | Discharge: 2013-11-22 | DRG: 065 | Disposition: A | Discharge status: Home / Self Care | Payer: Non-veteran care | Attending: Internal Medicine | Admitting: Internal Medicine

## 2013-11-20 ENCOUNTER — Emergency Department (HOSPITAL_COMMUNITY): Payer: Medicare Other

## 2013-11-20 ENCOUNTER — Encounter (HOSPITAL_COMMUNITY): Payer: Self-pay | Admitting: Emergency Medicine

## 2013-11-20 ENCOUNTER — Emergency Department (HOSPITAL_COMMUNITY)
Admission: EM | Admit: 2013-11-20 | Discharge: 2013-11-20 | Disposition: A | Discharge status: Home / Self Care | Payer: Medicare Other | Attending: Emergency Medicine | Admitting: Emergency Medicine

## 2013-11-20 ENCOUNTER — Emergency Department (HOSPITAL_COMMUNITY): Payer: Non-veteran care

## 2013-11-20 DIAGNOSIS — I6350 Cerebral infarction due to unspecified occlusion or stenosis of unspecified cerebral artery: Principal | ICD-10-CM | POA: Diagnosis present

## 2013-11-20 DIAGNOSIS — Z8249 Family history of ischemic heart disease and other diseases of the circulatory system: Secondary | ICD-10-CM | POA: Diagnosis not present

## 2013-11-20 DIAGNOSIS — Z833 Family history of diabetes mellitus: Secondary | ICD-10-CM | POA: Diagnosis not present

## 2013-11-20 DIAGNOSIS — I2510 Atherosclerotic heart disease of native coronary artery without angina pectoris: Secondary | ICD-10-CM | POA: Diagnosis present

## 2013-11-20 DIAGNOSIS — R531 Weakness: Secondary | ICD-10-CM

## 2013-11-20 DIAGNOSIS — H02409 Unspecified ptosis of unspecified eyelid: Secondary | ICD-10-CM | POA: Diagnosis present

## 2013-11-20 DIAGNOSIS — E785 Hyperlipidemia, unspecified: Secondary | ICD-10-CM | POA: Diagnosis present

## 2013-11-20 DIAGNOSIS — R5383 Other fatigue: Secondary | ICD-10-CM

## 2013-11-20 DIAGNOSIS — IMO0002 Reserved for concepts with insufficient information to code with codable children: Secondary | ICD-10-CM

## 2013-11-20 DIAGNOSIS — R5381 Other malaise: Secondary | ICD-10-CM | POA: Insufficient documentation

## 2013-11-20 DIAGNOSIS — Z8673 Personal history of transient ischemic attack (TIA), and cerebral infarction without residual deficits: Secondary | ICD-10-CM | POA: Insufficient documentation

## 2013-11-20 DIAGNOSIS — IMO0001 Reserved for inherently not codable concepts without codable children: Secondary | ICD-10-CM | POA: Diagnosis present

## 2013-11-20 DIAGNOSIS — Z9089 Acquired absence of other organs: Secondary | ICD-10-CM | POA: Insufficient documentation

## 2013-11-20 DIAGNOSIS — Z6841 Body Mass Index (BMI) 40.0 and over, adult: Secondary | ICD-10-CM

## 2013-11-20 DIAGNOSIS — Z7982 Long term (current) use of aspirin: Secondary | ICD-10-CM | POA: Diagnosis not present

## 2013-11-20 DIAGNOSIS — K219 Gastro-esophageal reflux disease without esophagitis: Secondary | ICD-10-CM | POA: Diagnosis present

## 2013-11-20 DIAGNOSIS — R29898 Other symptoms and signs involving the musculoskeletal system: Secondary | ICD-10-CM

## 2013-11-20 DIAGNOSIS — I129 Hypertensive chronic kidney disease with stage 1 through stage 4 chronic kidney disease, or unspecified chronic kidney disease: Secondary | ICD-10-CM | POA: Diagnosis present

## 2013-11-20 DIAGNOSIS — E669 Obesity, unspecified: Secondary | ICD-10-CM

## 2013-11-20 DIAGNOSIS — N3281 Overactive bladder: Secondary | ICD-10-CM

## 2013-11-20 DIAGNOSIS — Z87891 Personal history of nicotine dependence: Secondary | ICD-10-CM | POA: Insufficient documentation

## 2013-11-20 DIAGNOSIS — Z8619 Personal history of other infectious and parasitic diseases: Secondary | ICD-10-CM | POA: Insufficient documentation

## 2013-11-20 DIAGNOSIS — G4733 Obstructive sleep apnea (adult) (pediatric): Secondary | ICD-10-CM

## 2013-11-20 DIAGNOSIS — Z7902 Long term (current) use of antithrombotics/antiplatelets: Secondary | ICD-10-CM | POA: Insufficient documentation

## 2013-11-20 DIAGNOSIS — E782 Mixed hyperlipidemia: Secondary | ICD-10-CM | POA: Diagnosis present

## 2013-11-20 DIAGNOSIS — N181 Chronic kidney disease, stage 1: Secondary | ICD-10-CM | POA: Diagnosis present

## 2013-11-20 DIAGNOSIS — Z951 Presence of aortocoronary bypass graft: Secondary | ICD-10-CM

## 2013-11-20 DIAGNOSIS — I639 Cerebral infarction, unspecified: Secondary | ICD-10-CM | POA: Diagnosis present

## 2013-11-20 DIAGNOSIS — R209 Unspecified disturbances of skin sensation: Secondary | ICD-10-CM | POA: Insufficient documentation

## 2013-11-20 DIAGNOSIS — Z794 Long term (current) use of insulin: Secondary | ICD-10-CM | POA: Insufficient documentation

## 2013-11-20 DIAGNOSIS — I739 Peripheral vascular disease, unspecified: Secondary | ICD-10-CM | POA: Diagnosis present

## 2013-11-20 DIAGNOSIS — I10 Essential (primary) hypertension: Secondary | ICD-10-CM | POA: Diagnosis present

## 2013-11-20 DIAGNOSIS — G4730 Sleep apnea, unspecified: Secondary | ICD-10-CM | POA: Diagnosis present

## 2013-11-20 DIAGNOSIS — G909 Disorder of the autonomic nervous system, unspecified: Secondary | ICD-10-CM | POA: Diagnosis present

## 2013-11-20 DIAGNOSIS — Z79899 Other long term (current) drug therapy: Secondary | ICD-10-CM | POA: Insufficient documentation

## 2013-11-20 DIAGNOSIS — E1129 Type 2 diabetes mellitus with other diabetic kidney complication: Secondary | ICD-10-CM | POA: Diagnosis present

## 2013-11-20 DIAGNOSIS — Z8669 Personal history of other diseases of the nervous system and sense organs: Secondary | ICD-10-CM | POA: Insufficient documentation

## 2013-11-20 DIAGNOSIS — H409 Unspecified glaucoma: Secondary | ICD-10-CM | POA: Diagnosis present

## 2013-11-20 DIAGNOSIS — N189 Chronic kidney disease, unspecified: Secondary | ICD-10-CM | POA: Diagnosis present

## 2013-11-20 DIAGNOSIS — R200 Anesthesia of skin: Secondary | ICD-10-CM

## 2013-11-20 DIAGNOSIS — B353 Tinea pedis: Secondary | ICD-10-CM

## 2013-11-20 DIAGNOSIS — N184 Chronic kidney disease, stage 4 (severe): Secondary | ICD-10-CM | POA: Diagnosis present

## 2013-11-20 DIAGNOSIS — E66813 Obesity, class 3: Secondary | ICD-10-CM | POA: Diagnosis present

## 2013-11-20 DIAGNOSIS — E1165 Type 2 diabetes mellitus with hyperglycemia: Secondary | ICD-10-CM

## 2013-11-20 LAB — COMPREHENSIVE METABOLIC PANEL
ALT: 16 U/L (ref 0–53)
AST: 17 U/L (ref 0–37)
Albumin: 3.4 g/dL — ABNORMAL LOW (ref 3.5–5.2)
Alkaline Phosphatase: 53 U/L (ref 39–117)
BUN: 15 mg/dL (ref 6–23)
CO2: 27 mEq/L (ref 19–32)
Calcium: 9.1 mg/dL (ref 8.4–10.5)
Chloride: 103 mEq/L (ref 96–112)
Creatinine, Ser: 0.96 mg/dL (ref 0.50–1.35)
GFR calc Af Amer: >90 mL/min (ref >90)
GFR calc non Af Amer: 82 mL/min — ABNORMAL LOW (ref >90)
Glucose, Bld: 107 mg/dL — ABNORMAL HIGH (ref 70–99)
Potassium: 3.8 mEq/L (ref 3.7–5.3)
Sodium: 141 mEq/L (ref 137–147)
Total Bilirubin: 0.6 mg/dL (ref 0.3–1.2)
Total Protein: 7.6 g/dL (ref 6.0–8.3)

## 2013-11-20 LAB — RAPID URINE DRUG SCREEN, HOSP PERFORMED
Amphetamines: NOT DETECTED
Barbiturates: NOT DETECTED
Benzodiazepines: NOT DETECTED
Cocaine: NOT DETECTED
Opiates: NOT DETECTED
Tetrahydrocannabinol: NOT DETECTED

## 2013-11-20 LAB — CBC
HCT: 40.5 % (ref 39.0–52.0)
Hemoglobin: 13.4 g/dL (ref 13.0–17.0)
MCH: 30.5 pg (ref 26.0–34.0)
MCHC: 33.1 g/dL (ref 30.0–36.0)
MCV: 92 fL (ref 78.0–100.0)
Platelets: 201 10*3/uL (ref 150–400)
RBC: 4.4 MIL/uL (ref 4.22–5.81)
RDW: 13.4 % (ref 11.5–15.5)
WBC: 6.7 10*3/uL (ref 4.0–10.5)

## 2013-11-20 LAB — URINALYSIS, ROUTINE W REFLEX MICROSCOPIC
Bilirubin Urine: NEGATIVE
Glucose, UA: NEGATIVE mg/dL
Hgb urine dipstick: NEGATIVE
Ketones, ur: NEGATIVE mg/dL
Leukocytes, UA: NEGATIVE
Nitrite: NEGATIVE
Protein, ur: NEGATIVE mg/dL
Specific Gravity, Urine: 1.015 (ref 1.005–1.030)
Urobilinogen, UA: 1 mg/dL (ref 0.0–1.0)
pH: 7 (ref 5.0–8.0)

## 2013-11-20 LAB — DIFFERENTIAL
Basophils Absolute: 0 10*3/uL (ref 0.0–0.1)
Basophils Relative: 0 % (ref 0–1)
Eosinophils Absolute: 0.1 10*3/uL (ref 0.0–0.7)
Eosinophils Relative: 2 % (ref 0–5)
Lymphocytes Relative: 29 % (ref 12–46)
Lymphs Abs: 1.9 10*3/uL (ref 0.7–4.0)
Monocytes Absolute: 0.6 10*3/uL (ref 0.1–1.0)
Monocytes Relative: 9 % (ref 3–12)
Neutro Abs: 4 10*3/uL (ref 1.7–7.7)
Neutrophils Relative %: 60 % (ref 43–77)

## 2013-11-20 LAB — TROPONIN I: Troponin I: <0.3 ng/mL (ref <0.30)

## 2013-11-20 LAB — PROTIME-INR
INR: 1.04 (ref 0.00–1.49)
Prothrombin Time: 13.4 seconds (ref 11.6–15.2)

## 2013-11-20 LAB — POCT I-STAT TROPONIN I: Troponin i, poc: 0 ng/mL (ref 0.00–0.08)

## 2013-11-20 LAB — APTT: aPTT: 29 seconds (ref 24–37)

## 2013-11-20 LAB — ETHANOL: Alcohol, Ethyl (B): <11 mg/dL (ref 0–11)

## 2013-11-20 NOTE — ED Notes (Signed)
Seen here this morning for same, states since he's been home numbness has increased to entire left side. Feet to face. No other sx

## 2013-11-20 NOTE — ED Notes (Signed)
Pt was seen earlier today for left sided numbness and discharged home. Pt states the numbness has gotten worse and he states he is dizzy.

## 2013-11-20 NOTE — ED Provider Notes (Signed)
CSN: [id number]     Arrival date & time 11/20/13  2200 History  This chart was scribed for Wynetta Fines, MD by Eugenia Mcalpine, ED Scribe. This patient was seen in room APA06/[facility]06 and the patient's care was started at 12:22 AM.    Chief Complaint  Patient presents with  . Numbness   (Consider location/radiation/quality/duration/timing/severity/associated sxs/prior Treatment) HPI HPI Comments: [name] is a 73 y.o. male who presents to the Emergency Department agiain  After seen earlier today for similar symptoms on the left side of his body that resolved while in the ED.  symptoms are mild to moderate. There is no specific exacerbating or mitigating factor. Pt is now complaining of gradual onset numbness on the left side of his face and head, left arm and left leg with dizziness and associated trouble ambulating. Pt states that when he walks he has trouble balancing. Pt denies new visual disturbances, nausea, and vomiting.    Past Medical History  Diagnosis Date  . Hypertension   . Coronary artery disease   . Diabetes mellitus   . C. difficile diarrhea   . Peripheral vascular disease   . 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 Glaucoma   . GERD (gastroesophageal reflux disease)   . Stroke 2008  . Sleep apnea    Past Surgical History  Procedure Laterality Date  . Cholecystectomy    . Coronary artery bypass graft    . Thyroid surgery    . No past surgeries     Family History  Problem Relation Age of Onset  . Diabetes Mother   . Heart failure Mother   . Hypertension Mother   . Diabetes Father   . Heart failure Father   . Hypertension Father   . Diabetes Sister   . Heart failure Sister   . Hypertension Sister   . Hyperlipidemia Sister   . Diabetes Brother   . Heart failure Brother   . Hypertension Brother    History  Substance Use Topics  . Smoking status: Former Research scientist (life sciences)  . Smokeless tobacco: Never Used  . Alcohol Use: No    Review of  Systems 10 Systems reviewed and are negative for acute change except as noted in the HPI.  Allergies  Review of patient's allergies indicates no known allergies.  Home Medications   Current Outpatient Rx  Name  Route  Sig  Dispense  Refill  . amLODipine (NORVASC) 10 MG tablet   Oral   Take 10 mg by mouth daily.         [name] aspirin 81 MG chewable tablet   Oral   Chew 1 tablet (81 mg total) by mouth daily.         [name] atorvastatin (LIPITOR) 80 MG tablet   Oral   Take 40 mg by mouth daily.          . brimonidine (ALPHAGAN) 0.2 % ophthalmic solution   Both Eyes   Place 1 drop into both eyes 3 (three) times daily.         . clopidogrel (PLAVIX) 75 MG tablet   Oral   Take 1 tablet (75 mg total) by mouth daily.   30 tablet   0   . dorzolamide-timolol (COSOPT) 22.3-6.8 MG/ML ophthalmic solution   Both Eyes   Place 1 drop into both eyes 2 (two) times daily.         . insulin aspart protamine-insulin aspart (NOVOLOG 70/30) (70-30) 100 UNIT/ML injection  Subcutaneous   Inject into the skin 2 (two) times daily with a meal. Inject 40 units in AM and 50 unit in PM         . latanoprost (XALATAN) 0.005 % ophthalmic solution   Both Eyes   Place 1 drop into both eyes at bedtime.         [name] losartan (COZAAR) 100 MG tablet   Oral   Take 100 mg by mouth daily.           . metFORMIN (GLUCOPHAGE) 1000 MG tablet   Oral   Take 1,000 mg by mouth 2 (two) times daily with a meal.           . metoprolol (LOPRESSOR) 50 MG tablet   Oral   Take 0.5 tablets (25 mg total) by mouth 2 (two) times daily. FOR HEART/BLOOD PRESSURE. HOLD FOR SYSTOLIC BLOOD PRESSURE LESS THAN 1OO/HEART RATE LESS THAN 60         . pantoprazole (PROTONIX) 40 MG tablet   Oral   Take 40 mg by mouth daily.         . pioglitazone (ACTOS) 30 MG tablet   Oral   Take 30 mg by mouth daily.          [name] spironolactone (ALDACTONE) 25 MG tablet   Oral   Take 25 mg by mouth daily.           .  tamsulosin (FLOMAX) 0.4 MG CAPS   Oral   Take 0.4 mg by mouth daily.           BP 164/57  Pulse 71  Temp(Src) 99.2 F (37.3 C)  Resp 20  SpO2 96%  Physical Exam General: Well-developed, well-nourished male in no acute distress; appearance consistent with age of record HENT: normocephalic; atraumatic Eyes: pupils equal, round and reactive to light; extraocular muscles intact Neck: supple; no carotid bruit Heart: regular rate and rhythm; no murmurs, rubs or gallops Lungs: clear to auscultation bilaterally Abdomen: soft; nondistended; nontender; no masses or hepatosplenomegaly; bowel sounds present Extremities: No deformity; full range of motion; pulses normal Neurologic: Awake, alert and oriented; motor function intact in all extremities and symmetric; no facial droop, ptosis on left eye; decreased sensation on left side of face; strength 5/5 and symmetric; altered sensation on left side of face,left arm and left leg; mildly ataxic gait Skin: Warm and dry Psychiatric: Normal mood and affect  ED Course  Procedures (including critical care time)  DIAGNOSTIC STUDIES: Oxygen Saturation is 96% on RA, low by my interpretation.    COORDINATION OF CARE: 11:20 PM- Pt advised of plan for treatment including a CT of his head and pt agrees.   MDM   Nursing notes and vitals signs, including pulse oximetry, reviewed.  Summary of this visit's results, reviewed by myself:  Labs:  Results for orders placed during the hospital encounter of 11/20/13 (from the past 24 hour(s))  ETHANOL     Status: None   Collection Time    11/20/13 11:42 AM      Result Value Range   Alcohol, Ethyl (B) <11  0 - 11 mg/dL  PROTIME-INR     Status: None   Collection Time    11/20/13 11:42 AM      Result Value Range   Prothrombin Time 13.4  11.6 - 15.2 seconds   INR 1.04  0.00 - 1.49  APTT     Status: None   Collection Time    11/20/13 11:42 AM  Result Value Range   aPTT 29  24 - 37 seconds  CBC      Status: None   Collection Time    11/20/13 11:42 AM      Result Value Range   WBC 6.7  4.0 - 10.5 K/uL   RBC 4.40  4.22 - 5.81 MIL/uL   Hemoglobin 13.4  13.0 - 17.0 g/dL   HCT 40.5  39.0 - 52.0 %   MCV 92.0  78.0 - 100.0 fL   MCH 30.5  26.0 - 34.0 pg   MCHC 33.1  30.0 - 36.0 g/dL   RDW 13.4  11.5 - 15.5 %   Platelets 201  150 - 400 K/uL  DIFFERENTIAL     Status: None   Collection Time    11/20/13 11:42 AM      Result Value Range   Neutrophils Relative % 60  43 - 77 %   Neutro Abs 4.0  1.7 - 7.7 K/uL   Lymphocytes Relative 29  12 - 46 %   Lymphs Abs 1.9  0.7 - 4.0 K/uL   Monocytes Relative 9  3 - 12 %   Monocytes Absolute 0.6  0.1 - 1.0 K/uL   Eosinophils Relative 2  0 - 5 %   Eosinophils Absolute 0.1  0.0 - 0.7 K/uL   Basophils Relative 0  0 - 1 %   Basophils Absolute 0.0  0.0 - 0.1 K/uL  COMPREHENSIVE METABOLIC PANEL     Status: Abnormal   Collection Time    11/20/13 11:42 AM      Result Value Range   Sodium 141  137 - 147 mEq/L   Potassium 3.8  3.7 - 5.3 mEq/L   Chloride 103  96 - 112 mEq/L   CO2 27  19 - 32 mEq/L   Glucose, Bld 107 (*) 70 - 99 mg/dL   BUN 15  6 - 23 mg/dL   Creatinine, Ser 0.96  0.50 - 1.35 mg/dL   Calcium 9.1  8.4 - 10.5 mg/dL   Total Protein 7.6  6.0 - 8.3 g/dL   Albumin 3.4 (*) 3.5 - 5.2 g/dL   AST 17  0 - 37 U/L   ALT 16  0 - 53 U/L   Alkaline Phosphatase 53  39 - 117 U/L   Total Bilirubin 0.6  0.3 - 1.2 mg/dL   GFR calc non Af Amer 82 (*) >90 mL/min   GFR calc Af Amer >90  >90 mL/min  TROPONIN I     Status: None   Collection Time    11/20/13 11:42 AM      Result Value Range   Troponin I <0.30  <0.30 ng/mL  POCT I-STAT TROPONIN I     Status: None   Collection Time    11/20/13 11:46 AM      Result Value Range   Troponin i, poc 0.00  0.00 - 0.08 ng/mL   Comment 3           URINE RAPID DRUG SCREEN (HOSP PERFORMED)     Status: None   Collection Time    11/20/13  1:04 PM      Result Value Range   Opiates NONE DETECTED  NONE DETECTED    Cocaine NONE DETECTED  NONE DETECTED   Benzodiazepines NONE DETECTED  NONE DETECTED   Amphetamines NONE DETECTED  NONE DETECTED   Tetrahydrocannabinol NONE DETECTED  NONE DETECTED   Barbiturates NONE DETECTED  NONE DETECTED  URINALYSIS, ROUTINE W  REFLEX MICROSCOPIC     Status: None   Collection Time    11/20/13  1:04 PM      Result Value Range   Color, Urine YELLOW  YELLOW   APPearance CLEAR  CLEAR   Specific Gravity, Urine 1.015  1.005 - 1.030   pH 7.0  5.0 - 8.0   Glucose, UA NEGATIVE  NEGATIVE mg/dL   Hgb urine dipstick NEGATIVE  NEGATIVE   Bilirubin Urine NEGATIVE  NEGATIVE   Ketones, ur NEGATIVE  NEGATIVE mg/dL   Protein, ur NEGATIVE  NEGATIVE mg/dL   Urobilinogen, UA 1.0  0.0 - 1.0 mg/dL   Nitrite NEGATIVE  NEGATIVE   Leukocytes, UA NEGATIVE  NEGATIVE    Imaging Studies: Ct Head Wo Contrast  11/21/2013   CLINICAL DATA:  Left hand and foot numbness. Left face numbness and dizziness.  EXAM: CT HEAD WITHOUT CONTRAST  TECHNIQUE: Contiguous axial images were obtained from the base of the skull through the vertex without intravenous contrast.  COMPARISON:  Earlier today at 1200 hr.  FINDINGS: 2354 hr.  Sinuses/Soft tissues: Mucosal thickening of the left maxillary sinus and bilateral ethmoid air cells. Minimal frontal sinus mucosal thickening. Clear mastoid air cells.  Intracranial: Mild low density in the periventricular white matter likely related to small vessel disease. Right thalamic and centrum semi ovale lacunar infarcts which are remote. No mass lesion, hemorrhage, hydrocephalus, acute infarct, intra-axial, or extra-axial fluid collection. Vertebral and carotid atherosclerosis.  IMPRESSION: 1.  No acute intracranial abnormality. 2. Sinus disease. 3. Remote  lacunar infarcts with small vessel ischemic change.   Electronically Signed   By: Abigail Miyamoto M.D.   On: 11/21/2013 00:10   Ct Head Wo Contrast  11/20/2013   CLINICAL DATA:  Left-sided numbness with onset 3 hr ago. History  of stroke.  EXAM: CT HEAD WITHOUT CONTRAST  TECHNIQUE: Contiguous axial images were obtained from the base of the skull through the vertex without intravenous contrast.  COMPARISON:  Brain MRI 08/22/2013  FINDINGS: Old lacunar infarcts are seen in the right centrum semiovale and bilateral thalamus. There is no evidence of acute large territory cortical infarct, mass, midline shift, intracranial hemorrhage, or extra-axial fluid collection. Age-related cerebral atrophy is present. Periventricular white-matter hypodensities are compatible with moderate chronic small vessel ischemic disease. The orbits are unremarkable. Mastoid air cells are clear. Moderate bilateral ethmoid and left maxillary sinus mucosal thickening are noted. There is mild left frontal and right maxillary sinus mucosal thickening.  IMPRESSION: No evidence of acute intracranial abnormality.   Electronically Signed   By: Logan Bores   On: 11/20/2013 13:08      I personally performed the services described in this documentation, which was scribed in my presence.  The recorded information has been reviewed and is accurate.   Wynetta Fines, MD 11/21/13 [phone]

## 2013-11-20 NOTE — ED Notes (Signed)
Discharge instructions reviewed with pt, questions answered. Pt verbalized understanding.  

## 2013-11-20 NOTE — ED Notes (Signed)
Left sides numbness onset 1000 today, history of stroke

## 2013-11-20 NOTE — ED Provider Notes (Signed)
CSN: [id number]     Arrival date & time 11/20/13  1055 History  This chart was scribed for Charles Cable, MD by Roxan Diesel, ED scribe.  This patient was seen in room APA16A/APA16A and the patient's care was started at 11:10 AM.   Chief Complaint  Patient presents with  . Numbness    Patient is a 73 y.o. male presenting with neurologic complaint. The history is provided by the patient. No language interpreter was used.  Neurologic Problem This is a new problem. Episode onset: Noticed on waking this morning. The problem occurs constantly. Associated symptoms include headaches (last night, since resolved). Pertinent negatives include no chest pain, no abdominal pain and no shortness of breath. He has tried nothing for the symptoms.    HPI Comments: [name] is a 73 y.o. male with h/o stroke, DM, HTN and CAD who presents to the Emergency Department complaining of left-sided numbness that he noticed on waking this morning at around 10 AM.  Pt states he had a headache last night which has since resolved, but he was otherwise feeling well.  This morning on waking he noticed numbness localized to the fingers of his left hand and the top of his left foot.  This has been constant since then.  He denies weakness or facial numbness.  He denies speech difficulties, new visual loss, back pain, CP, SOB, abdominal pain, syncope, vomiting, diarrhea, or neck pain.  Pt has h/o 3 "mini-strokes" but he states that these were accompanied by weakness as well as numbness.  He denies recent medication changes.   Past Medical History  Diagnosis Date  . Hypertension   . Coronary artery disease   . Diabetes mellitus   . C. difficile diarrhea   . Peripheral vascular disease   . 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 Glaucoma   . GERD (gastroesophageal reflux disease)   . Stroke 2008  . Sleep apnea     Past Surgical History  Procedure Laterality Date  . Cholecystectomy     . Coronary artery bypass graft    . Thyroid surgery    . No past surgeries      Family History  Problem Relation Age of Onset  . Diabetes Mother   . Heart failure Mother   . Hypertension Mother   . Diabetes Father   . Heart failure Father   . Hypertension Father   . Diabetes Sister   . Heart failure Sister   . Hypertension Sister   . Hyperlipidemia Sister   . Diabetes Brother   . Heart failure Brother   . Hypertension Brother     History  Substance Use Topics  . Smoking status: Former Research scientist (life sciences)  . Smokeless tobacco: Never Used  . Alcohol Use: No     Review of Systems  Constitutional: Negative for fever.  Respiratory: Negative for shortness of breath.   Cardiovascular: Negative for chest pain.  Gastrointestinal: Negative for vomiting and abdominal pain.  Neurological: Positive for headaches (last night, since resolved). Negative for speech difficulty.  All other systems reviewed and are negative.     Allergies  Review of patient's allergies indicates no known allergies.  Home Medications   Current Outpatient Rx  Name  Route  Sig  Dispense  Refill  . amLODipine (NORVASC) 10 MG tablet   Oral   Take 10 mg by mouth daily.         [name] aspirin 81 MG chewable tablet  Oral   Chew 1 tablet (81 mg total) by mouth daily.         [name] atorvastatin (LIPITOR) 80 MG tablet   Oral   Take 40 mg by mouth daily.          . brimonidine (ALPHAGAN) 0.2 % ophthalmic solution   Both Eyes   Place 1 drop into both eyes 3 (three) times daily.         . clopidogrel (PLAVIX) 75 MG tablet   Oral   Take 1 tablet (75 mg total) by mouth daily.   30 tablet   0   . dorzolamide-timolol (COSOPT) 22.3-6.8 MG/ML ophthalmic solution   Both Eyes   Place 1 drop into both eyes 2 (two) times daily.         . insulin aspart protamine-insulin aspart (NOVOLOG 70/30) (70-30) 100 UNIT/ML injection   Subcutaneous   Inject into the skin 2 (two) times daily with a meal. Inject 40 units in  AM and 50 unit in PM         . latanoprost (XALATAN) 0.005 % ophthalmic solution   Both Eyes   Place 1 drop into both eyes at bedtime.         [name] losartan (COZAAR) 100 MG tablet   Oral   Take 100 mg by mouth daily.           . metFORMIN (GLUCOPHAGE) 1000 MG tablet   Oral   Take 1,000 mg by mouth 2 (two) times daily with a meal.           . metoprolol (LOPRESSOR) 50 MG tablet   Oral   Take 0.5 tablets (25 mg total) by mouth 2 (two) times daily. FOR HEART/BLOOD PRESSURE. HOLD FOR SYSTOLIC BLOOD PRESSURE LESS THAN 1OO/HEART RATE LESS THAN 60         . pantoprazole (PROTONIX) 40 MG tablet   Oral   Take 40 mg by mouth daily.         . pioglitazone (ACTOS) 30 MG tablet   Oral   Take 30 mg by mouth daily.          [name] spironolactone (ALDACTONE) 25 MG tablet   Oral   Take 25 mg by mouth daily.           . tamsulosin (FLOMAX) 0.4 MG CAPS   Oral   Take 0.4 mg by mouth daily.           BP 158/66  Pulse 58  Temp(Src) 99 F (37.2 C)  Resp 20  Ht 5\' 8"  (1.727 m)  Wt 280 lb (127.007 kg)  BMI 42.58 kg/m2  SpO2 96%   Physical Exam  Nursing note and vitals reviewed. CONSTITUTIONAL: Well developed/well nourished HEAD: Normocephalic/atraumatic EYES: EOMI/PERRL ENMT: Mucous membranes moist NECK: supple no meningeal signs SPINE:entire spine nontender CV: S1/S2 noted, no murmurs/rubs/gallops noted LUNGS: Lungs are clear to auscultation bilaterally, no apparent distress ABDOMEN: soft, nontender, no rebound or guarding GU:no cva tenderness NEURO: Pt is awake/alert, moves all extremitiesx4.  No arm or leg drift, no facial droop, no dysarthria or aphasia.  Reports numbness to left fingers and dorsal aspect of left foot only.  No other sensory deficit noted. EXTREMITIES: pulses normal, full ROM SKIN: warm, color normal PSYCH: no abnormalities of mood noted    ED Course  Procedures (including critical care time)  DIAGNOSTIC STUDIES: Oxygen Saturation is 96% on  room air, normal by my interpretation.    COORDINATION OF CARE: 11:17 AM-Discussed  treatment plan which includes head CT and labs with pt at bedside and pt agreed to plan.   2:26 PM Pt observed for several hrs He reports all symptoms resolved He had no associated weakness at all today.  He feels otherwise well My suspicion for acute CVA is low given he only had numbness to fingers and dorsal aspect of left foot He is already on asa/plavix per his neurologist and he had his CVA evaluation last October He would like go home, and at this point I feel he is safe for d/c home He has f/u within a week with his PCP (he reports he has f/u this Friday) We discussed strict return precautions Pt agreeable with plan   Labs Review Labs Reviewed  COMPREHENSIVE METABOLIC PANEL - Abnormal; Notable for the following:    Glucose, Bld 107 (*)    Albumin 3.4 (*)    GFR calc non Af Amer 82 (*)    All other components within normal limits  ETHANOL  PROTIME-INR  APTT  CBC  DIFFERENTIAL  TROPONIN I  URINE RAPID DRUG SCREEN (HOSP PERFORMED)  URINALYSIS, ROUTINE W REFLEX MICROSCOPIC  POCT I-STAT TROPONIN I    Imaging Review Ct Head Wo Contrast  11/20/2013   CLINICAL DATA:  Left-sided numbness with onset 3 hr ago. History of stroke.  EXAM: CT HEAD WITHOUT CONTRAST  TECHNIQUE: Contiguous axial images were obtained from the base of the skull through the vertex without intravenous contrast.  COMPARISON:  Brain MRI 08/22/2013  FINDINGS: Old lacunar infarcts are seen in the right centrum semiovale and bilateral thalamus. There is no evidence of acute large territory cortical infarct, mass, midline shift, intracranial hemorrhage, or extra-axial fluid collection. Age-related cerebral atrophy is present. Periventricular white-matter hypodensities are compatible with moderate chronic small vessel ischemic disease. The orbits are unremarkable. Mastoid air cells are clear. Moderate bilateral ethmoid and left  maxillary sinus mucosal thickening are noted. There is mild left frontal and right maxillary sinus mucosal thickening.  IMPRESSION: No evidence of acute intracranial abnormality.   Electronically Signed   By: Logan Bores   On: 11/20/2013 13:08    EKG Interpretation    Date/Time:  Tuesday November 20 2013 11:38:54 EST Ventricular Rate:  59 PR Interval:  214 QRS Duration: 108 QT Interval:  392 QTC Calculation: 388 R Axis:   -67 Text Interpretation:  Sinus bradycardia with 1st degree A-V block Left anterior fascicular block Cannot rule out Anterior infarct (cited on or before 16-Feb-2007) Abnormal ECG When compared with ECG of 22-Aug-2013 10:42, No significant change was found Confirmed by Christy Gentles  MD, Kamyla Olejnik ([id number]) on 11/20/2013 11:58:15 AM            MDM  No diagnosis found. Nursing notes including past medical history and social history reviewed and considered in documentation Labs/vital reviewed and considered     I personally performed the services described in this documentation, which was scribed in my presence. The recorded information has been reviewed and is accurate.       Charles Cable, MD 11/20/13 [phone]

## 2013-11-21 ENCOUNTER — Observation Stay (HOSPITAL_COMMUNITY): Payer: Non-veteran care

## 2013-11-21 ENCOUNTER — Encounter (HOSPITAL_COMMUNITY): Payer: Self-pay | Admitting: *Deleted

## 2013-11-21 ENCOUNTER — Inpatient Hospital Stay (HOSPITAL_COMMUNITY): Payer: Non-veteran care

## 2013-11-21 DIAGNOSIS — N189 Chronic kidney disease, unspecified: Secondary | ICD-10-CM | POA: Diagnosis present

## 2013-11-21 DIAGNOSIS — N184 Chronic kidney disease, stage 4 (severe): Secondary | ICD-10-CM | POA: Diagnosis present

## 2013-11-21 DIAGNOSIS — I639 Cerebral infarction, unspecified: Secondary | ICD-10-CM | POA: Diagnosis present

## 2013-11-21 DIAGNOSIS — Z8673 Personal history of transient ischemic attack (TIA), and cerebral infarction without residual deficits: Secondary | ICD-10-CM

## 2013-11-21 DIAGNOSIS — I6350 Cerebral infarction due to unspecified occlusion or stenosis of unspecified cerebral artery: Principal | ICD-10-CM

## 2013-11-21 LAB — GLUCOSE, CAPILLARY
Glucose-Capillary: 59 mg/dL — ABNORMAL LOW (ref 70–99)
Glucose-Capillary: 73 mg/dL (ref 70–99)
Glucose-Capillary: 70 mg/dL (ref 70–99)
Glucose-Capillary: 49 mg/dL — ABNORMAL LOW (ref 70–99)
Glucose-Capillary: 84 mg/dL (ref 70–99)
Glucose-Capillary: 138 mg/dL — ABNORMAL HIGH (ref 70–99)
Glucose-Capillary: 206 mg/dL — ABNORMAL HIGH (ref 70–99)
Glucose-Capillary: 176 mg/dL — ABNORMAL HIGH (ref 70–99)

## 2013-11-21 LAB — LIPID PANEL
Cholesterol: 129 mg/dL (ref 0–200)
HDL: 37 mg/dL — ABNORMAL LOW (ref >39)
LDL Cholesterol: 80 mg/dL (ref 0–99)
Total CHOL/HDL Ratio: 3.5 RATIO
Triglycerides: 59 mg/dL (ref <150)
VLDL: 12 mg/dL (ref 0–40)

## 2013-11-21 LAB — HEMOGLOBIN A1C
Hgb A1c MFr Bld: 7.1 % — ABNORMAL HIGH (ref <5.7)
Mean Plasma Glucose: 157 mg/dL — ABNORMAL HIGH (ref <117)

## 2013-11-21 MED ORDER — DORZOLAMIDE HCL-TIMOLOL MAL 2-0.5 % OP SOLN
1.0000 [drp] | Freq: Two times a day (BID) | OPHTHALMIC | Status: DC
  Administered 2013-11-21 – 2013-11-22 (×3): 1 [drp] via OPHTHALMIC
  Filled 2013-11-21: qty 10

## 2013-11-21 MED ORDER — ASPIRIN-DIPYRIDAMOLE ER 25-200 MG PO CP12
1.0000 | ORAL_CAPSULE | Freq: Every day | ORAL | Status: DC
  Administered 2013-11-21 – 2013-11-22 (×2): 1 via ORAL
  Filled 2013-11-21 (×3): qty 1

## 2013-11-21 MED ORDER — CLOPIDOGREL BISULFATE 75 MG PO TABS
75.0000 mg | ORAL_TABLET | Freq: Every day | ORAL | Status: DC
  Administered 2013-11-21: 75 mg via ORAL
  Filled 2013-11-21: qty 1

## 2013-11-21 MED ORDER — INSULIN ASPART 100 UNIT/ML ~~LOC~~ SOLN
0.0000 [IU] | Freq: Three times a day (TID) | SUBCUTANEOUS | Status: DC
  Administered 2013-11-21: 1 [IU] via SUBCUTANEOUS
  Administered 2013-11-21: 3 [IU] via SUBCUTANEOUS
  Administered 2013-11-22: 2 [IU] via SUBCUTANEOUS

## 2013-11-21 MED ORDER — SPIRONOLACTONE 25 MG PO TABS
25.0000 mg | ORAL_TABLET | Freq: Every day | ORAL | Status: DC
  Administered 2013-11-21 – 2013-11-22 (×2): 25 mg via ORAL
  Filled 2013-11-21 (×4): qty 1

## 2013-11-21 MED ORDER — TAMSULOSIN HCL 0.4 MG PO CAPS
0.4000 mg | ORAL_CAPSULE | Freq: Every day | ORAL | Status: DC
  Administered 2013-11-21 – 2013-11-22 (×2): 0.4 mg via ORAL
  Filled 2013-11-21 (×4): qty 1

## 2013-11-21 MED ORDER — LATANOPROST 0.005 % OP SOLN
1.0000 [drp] | Freq: Every day | OPHTHALMIC | Status: DC
  Administered 2013-11-21: 1 [drp] via OPHTHALMIC
  Filled 2013-11-21: qty 2.5

## 2013-11-21 MED ORDER — SENNOSIDES-DOCUSATE SODIUM 8.6-50 MG PO TABS
1.0000 | ORAL_TABLET | Freq: Every evening | ORAL | Status: DC | PRN
  Filled 2013-11-21: qty 1

## 2013-11-21 MED ORDER — ASPIRIN 81 MG PO CHEW
81.0000 mg | CHEWABLE_TABLET | Freq: Every day | ORAL | Status: DC
  Administered 2013-11-21 – 2013-11-22 (×2): 81 mg via ORAL
  Filled 2013-11-21 (×2): qty 1

## 2013-11-21 MED ORDER — ENOXAPARIN SODIUM 40 MG/0.4ML ~~LOC~~ SOLN
40.0000 mg | SUBCUTANEOUS | Status: DC
  Administered 2013-11-21 – 2013-11-22 (×2): 40 mg via SUBCUTANEOUS
  Filled 2013-11-21 (×2): qty 0.4

## 2013-11-21 MED ORDER — INSULIN ASPART PROT & ASPART (70-30 MIX) 100 UNIT/ML ~~LOC~~ SUSP
40.0000 [IU] | Freq: Two times a day (BID) | SUBCUTANEOUS | Status: DC
  Administered 2013-11-21 – 2013-11-22 (×2): 40 [IU] via SUBCUTANEOUS
  Filled 2013-11-21: qty 10

## 2013-11-21 MED ORDER — AMLODIPINE BESYLATE 5 MG PO TABS
10.0000 mg | ORAL_TABLET | Freq: Every day | ORAL | Status: DC
  Administered 2013-11-21 – 2013-11-22 (×2): 10 mg via ORAL
  Filled 2013-11-21 (×2): qty 2

## 2013-11-21 MED ORDER — ATORVASTATIN CALCIUM 40 MG PO TABS
40.0000 mg | ORAL_TABLET | Freq: Every day | ORAL | Status: DC
  Administered 2013-11-21: 40 mg via ORAL
  Filled 2013-11-21 (×2): qty 1

## 2013-11-21 MED ORDER — BRIMONIDINE TARTRATE 0.2 % OP SOLN
1.0000 [drp] | Freq: Three times a day (TID) | OPHTHALMIC | Status: DC
  Administered 2013-11-21 – 2013-11-22 (×4): 1 [drp] via OPHTHALMIC
  Filled 2013-11-21: qty 5

## 2013-11-21 MED ORDER — ENOXAPARIN SODIUM 40 MG/0.4ML ~~LOC~~ SOLN: 40.0000 mg | SUBCUTANEOUS | Status: DC

## 2013-11-21 MED ORDER — PANTOPRAZOLE SODIUM 40 MG PO TBEC
40.0000 mg | DELAYED_RELEASE_TABLET | Freq: Every day | ORAL | Status: DC
  Administered 2013-11-21 – 2013-11-22 (×2): 40 mg via ORAL
  Filled 2013-11-21 (×2): qty 1

## 2013-11-21 NOTE — Progress Notes (Signed)
SLP Cancellation Note  Patient Details Name: HEWLETT BARRUETA MRN: [id number] DOB: 10-21-1941   Cancelled treatment:       Reason Eval/Treat Not Completed: Patient at procedure or test/unavailable   Shaily Librizzi S 11/21/2013, 10:55 AM

## 2013-11-21 NOTE — Progress Notes (Deleted)
Pt unable to void in urinal.  Tried to In/out cath pt and was unsuccessful at passing the tube. Pt able to void in depend that he was wearing.  Attempting a condom cath to see if the pt will void on his own as he does in his depend.

## 2013-11-21 NOTE — Progress Notes (Signed)
UR completed 

## 2013-11-21 NOTE — Consult Note (Signed)
[place] A. Merlene Laughter, MD     [email or web address]          [name] is an 73 y.o. male.   ASSESSMENT/PLAN: 1. 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 Unfortunately, the patient has had recurrent events. This is the second event in the last 3 months. He actually had multiple events. They were all liquid infarcts. Risk factors obesity, hypertension, diabetes, sleep apnea syndrome and dyslipidemia. The patient was not a candidate for formal lysis/TPA because of the mild symptoms and being outside of the therapy window. The patient's regimen will be switched from aspirin and Plavix to Aggrenox. Reduce risk of headache associated with Aggrenox, the dose of the Aggrenox would be one standard increased to twice a day dosing after 5 days. He can continue with aspirin 81 mg until he has reached the full dose of the Aggrenox. He is to continue with risk factor modifications including hypertension and diabetes control. He is using his CPAP machine for sleep apnea syndrome. Increase physical activity is also recommended. We will also check an RPR level, homocysteine level and vitamin B12 level. Continue statin.   This is a 73 year old male who presents with acute onset of numbness involving the left upper extremity and left lower extremity. Left upper extremity has been more symptomatically. He was seen in the emergency room a couple days ago but had significant symptoms with his symptoms will last for a few hours. He was discharged after returning close normal. Fortunately, he presented to the following day with returning left-sided numbness and tingling this time even worse than before. The patient also seemed to have dizziness/gait ataxia with his current event. The patient denies dysarthria, aphasia, headaches, weakness, chest pressure or shortness of breath. There is no GI or GU symptoms. He does  report having some blurred vision involving the right eye but this is a chronic problem. The review of systems otherwise negative. The patient reportedly has had multiple events in the past. He was recently discharged from this hospital October 2014 with her another recurrent lacunar infarct. He was told to take dual antiplatelet agent and to stop after 3 months. He actually was going to refer to a single antiplatelet agent within a week when he developed these current symptoms. The patient does have sleep apnea syndrome and is compliant with his machine. He seems to have a sedentary lifestyle.  GENERAL: This is a pleasant morbidly obese man in no acute distress.  HEENT: The patient has a large stocky neck. Tongue size is large and he has marked crowding of the posterior space. The patient has mild ptosis on the left side but normal pupils. This is a new finding for the patient.  ABDOMEN: soft  EXTREMITIES: No edema   BACK: Unremarkable.  SKIN: Normal by inspection.    MENTAL STATUS: Alert and oriented. Speech, language and cognition are generally intact. Judgment and insight normal.   CRANIAL NERVES: Pupils are equal, round and reactive to light and accommodation; extra ocular movements are full, there is no significant nystagmus; visual fields are full; upper and lower facial muscles are normal in strength and symmetric, there is no flattening of the nasolabial folds; tongue is midline; uvula is midline; shoulder elevation is normal.  MOTOR: Normal tone, bulk and strength; no pronator drift.  COORDINATION: Left finger to nose is normal, right finger to nose is normal, No rest tremor; no intention tremor; no postural  tremor; no bradykinesia.  REFLEXES: Deep tendon reflexes are symmetrical and normal. Plantar responses are flexor bilaterally.   SENSATION: This is reduced to temperature and light touch involving the left upper extremity. Interestingly left facial region and left leg are  normal. Right side is normal.   Past Medical History  Diagnosis Date  . Hypertension   . Coronary artery disease   . Diabetes mellitus   . C. difficile diarrhea   . Peripheral vascular disease   . 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 Glaucoma   . GERD (gastroesophageal reflux disease)   . Stroke 2008  . Sleep apnea   . 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 Cholecystectomy    . Coronary artery bypass graft    . Thyroid surgery    . No past surgeries      Family History  Problem Relation Age of Onset  . Diabetes Mother   . Heart failure Mother   . Hypertension Mother   . Diabetes Father   . Heart failure Father   . Hypertension Father   . Diabetes Sister   . Heart failure Sister   . Hypertension Sister   . Hyperlipidemia Sister   . Diabetes Brother   . Heart failure Brother   . Hypertension Brother     Social History:  reports that he has quit smoking. He has never used smokeless tobacco. He reports that he does not drink alcohol or use illicit drugs.  Allergies: No Known Allergies  Medications: Prior to Admission medications   Medication Sig Start Date End Date Taking? Authorizing Provider  amLODipine (NORVASC) 10 MG tablet Take 10 mg by mouth daily.   Yes Historical Provider, MD  aspirin 81 MG chewable tablet Chew 1 tablet (81 mg total) by mouth daily. 08/24/13  Yes Eugenie Filler, MD  brimonidine (ALPHAGAN) 0.2 % ophthalmic solution Place 1 drop into both eyes 3 (three) times daily.   Yes Historical Provider, MD  clopidogrel (PLAVIX) 75 MG tablet Take 1 tablet (75 mg total) by mouth daily. 08/24/13  Yes Eugenie Filler, MD  dorzolamide-timolol (COSOPT) 22.3-6.8 MG/ML ophthalmic solution Place 1 drop into both eyes 2 (two) times daily.   Yes Historical Provider, MD  insulin aspart protamine-insulin aspart (NOVOLOG 70/30) (70-30) 100 UNIT/ML  injection Inject into the skin 2 (two) times daily with a meal. Inject 40 units in AM and 50 unit in PM   Yes Historical Provider, MD  losartan (COZAAR) 100 MG tablet Take 100 mg by mouth daily.     Yes Historical Provider, MD  metFORMIN (GLUCOPHAGE) 1000 MG tablet Take 1,000 mg by mouth 2 (two) times daily with a meal.     Yes Historical Provider, MD  metoprolol (LOPRESSOR) 50 MG tablet Take 0.5 tablets (25 mg total) by mouth 2 (two) times daily. FOR HEART/BLOOD PRESSURE. HOLD FOR SYSTOLIC BLOOD PRESSURE LESS THAN 1OO/HEART RATE LESS THAN 60 05/20/11  Yes Annita Brod, MD  pantoprazole (PROTONIX) 40 MG tablet Take 40 mg by mouth daily.   Yes Historical Provider, MD  pioglitazone (ACTOS) 30 MG tablet Take 30 mg by mouth daily.    Yes Historical Provider, MD  spironolactone (ALDACTONE) 25 MG tablet Take 25 mg by mouth daily.     Yes Historical Provider, MD  tamsulosin (FLOMAX) 0.4 MG CAPS Take 0.4 mg by mouth daily.  Yes Historical Provider, MD  atorvastatin (LIPITOR) 80 MG tablet Take 40 mg by mouth daily.     Historical Provider, MD  latanoprost (XALATAN) 0.005 % ophthalmic solution Place 1 drop into both eyes at bedtime.    Historical Provider, MD     Scheduled Meds: . amLODipine  10 mg Oral Daily  . aspirin  81 mg Oral Daily  . atorvastatin  40 mg Oral QPC supper  . brimonidine  1 drop Both Eyes TID  . clopidogrel  75 mg Oral Daily  . dorzolamide-timolol  1 drop Both Eyes BID  . enoxaparin (LOVENOX) injection  40 mg Subcutaneous Q24H  . insulin aspart  0-9 Units Subcutaneous TID WC  . insulin aspart protamine- aspart  40 Units Subcutaneous BID WC  . latanoprost  1 drop Both Eyes QHS  . pantoprazole  40 mg Oral Daily  . spironolactone  25 mg Oral Daily  . tamsulosin  0.4 mg Oral Daily   Continuous Infusions:  PRN Meds:.senna-docusate   Blood pressure 177/63, pulse 57, temperature 97.4 F (36.3 C), temperature source Oral, resp. rate 16, height 5\' 9"  (1.753 m), weight 127.5 kg  (281 lb 1.4 oz), SpO2 100.00%.   Results for orders placed during the hospital encounter of 11/20/13 (from the past 48 hour(s))  GLUCOSE, CAPILLARY     Status: Abnormal   Collection Time    11/21/13  2:29 AM      Result Value Range   Glucose-Capillary 59 (*) 70 - 99 mg/dL   Comment 1 Notify RN    GLUCOSE, CAPILLARY     Status: None   Collection Time    11/21/13  2:58 AM      Result Value Range   Glucose-Capillary 73  70 - 99 mg/dL  GLUCOSE, CAPILLARY     Status: None   Collection Time    11/21/13  4:38 AM      Result Value Range   Glucose-Capillary 70  70 - 99 mg/dL  LIPID PANEL     Status: Abnormal   Collection Time    11/21/13  5:48 AM      Result Value Range   Cholesterol 129  0 - 200 mg/dL   Triglycerides 59  <150 mg/dL   HDL 37 (*) >39 mg/dL   Total CHOL/HDL Ratio 3.5     VLDL 12  0 - 40 mg/dL   LDL Cholesterol 80  0 - 99 mg/dL   Comment:            Total Cholesterol/HDL:CHD Risk     Coronary Heart Disease Risk Table                         Men   Women      1/2 Average Risk   3.4   3.3      Average Risk       5.0   4.4      2 X Average Risk   9.6   7.1      3 X Average Risk  23.4   11.0                Use the calculated Patient Ratio     above and the CHD Risk Table     to determine the patient's CHD Risk.                ATP III CLASSIFICATION (LDL):      <100  mg/dL   Optimal      100-129  mg/dL   Near or Above                        Optimal      130-159  mg/dL   Borderline      160-189  mg/dL   High      >190     mg/dL   Very High  GLUCOSE, CAPILLARY     Status: Abnormal   Collection Time    11/21/13  7:41 AM      Result Value Range   Glucose-Capillary 49 (*) 70 - 99 mg/dL   Comment 1 Notify RN    GLUCOSE, CAPILLARY     Status: None   Collection Time    11/21/13  9:13 AM      Result Value Range   Glucose-Capillary 84  70 - 99 mg/dL  GLUCOSE, CAPILLARY     Status: Abnormal   Collection Time    11/21/13 11:41 AM      Result Value Range    Glucose-Capillary 138 (*) 70 - 99 mg/dL   Comment 1 Notify RN      Dg Chest 2 View  11/21/2013   CLINICAL DATA:  Left-sided weakness.  EXAM: CHEST  2 VIEW  COMPARISON:  Chest radiograph performed 08/22/2013  FINDINGS: The lungs are well-aerated. Pulmonary vascularity is at the upper limits of normal. There is no evidence of focal opacification, pleural effusion or pneumothorax.  The heart is borderline normal in size. The patient is status post median sternotomy. No acute osseous abnormalities are seen.  IMPRESSION: No acute cardiopulmonary process seen.   Electronically Signed   By: Garald Balding M.D.   On: 11/21/2013 01:56   Ct Head Wo Contrast  11/21/2013   CLINICAL DATA:  Left hand and foot numbness. Left face numbness and dizziness.  EXAM: CT HEAD WITHOUT CONTRAST  TECHNIQUE: Contiguous axial images were obtained from the base of the skull through the vertex without intravenous contrast.  COMPARISON:  Earlier today at 1200 hr.  FINDINGS: 2354 hr.  Sinuses/Soft tissues: Mucosal thickening of the left maxillary sinus and bilateral ethmoid air cells. Minimal frontal sinus mucosal thickening. Clear mastoid air cells.  Intracranial: Mild low density in the periventricular white matter likely related to small vessel disease. Right thalamic and centrum semi ovale lacunar infarcts which are remote. No mass lesion, hemorrhage, hydrocephalus, acute infarct, intra-axial, or extra-axial fluid collection. Vertebral and carotid atherosclerosis.  IMPRESSION: 1.  No acute intracranial abnormality. 2. Sinus disease. 3. Remote  lacunar infarcts with small vessel ischemic change.   Electronically Signed   By: Abigail Miyamoto M.D.   On: 11/21/2013 00:10   Ct Head Wo Contrast  11/20/2013   CLINICAL DATA:  Left-sided numbness with onset 3 hr ago. History of stroke.  EXAM: CT HEAD WITHOUT CONTRAST  TECHNIQUE: Contiguous axial images were obtained from the base of the skull through the vertex without intravenous contrast.   COMPARISON:  Brain MRI 08/22/2013  FINDINGS: Old lacunar infarcts are seen in the right centrum semiovale and bilateral thalamus. There is no evidence of acute large territory cortical infarct, mass, midline shift, intracranial hemorrhage, or extra-axial fluid collection. Age-related cerebral atrophy is present. Periventricular white-matter hypodensities are compatible with moderate chronic small vessel ischemic disease. The orbits are unremarkable. Mastoid air cells are clear. Moderate bilateral ethmoid and left maxillary sinus mucosal thickening are noted. There is mild left frontal and  right maxillary sinus mucosal thickening.  IMPRESSION: No evidence of acute intracranial abnormality.   Electronically Signed   By: Logan Bores   On: 11/20/2013 13:08   Mr Jodene Nam Head Wo Contrast  11/21/2013   CLINICAL DATA:  Stroke.  New onset of left-sided numbness.  EXAM: MRI HEAD WITHOUT CONTRAST  MRA HEAD WITHOUT CONTRAST  TECHNIQUE: Multiplanar, multiecho pulse sequences of the brain and surrounding structures were obtained without intravenous contrast. Angiographic images of the head were obtained using MRA technique without contrast.  COMPARISON:  Head CT 11/20/2013 and brain MRI and MRA 08/22/2013  FINDINGS: MRI HEAD FINDINGS  There is a 4 mm focus of restricted diffusion in the right aspect of the pons. There is also a 2 mm focus of likely restricted diffusion within the left pons. There is no other evidence of acute infarct. Periventricular white matter T2 hyperintensities are unchanged and compatible with moderate chronic small vessel ischemic disease. Remote infarcts are noted in the basal ganglia, thalami and the left cerebellum. There is moderate cerebral atrophy. There is no evidence of mass, midline shift, intracranial hemorrhage, or extra-axial fluid collection. Orbits are unremarkable. Extensive bilateral ethmoid air cell mucosal thickening is noted in addition to mild to moderate left greater than right  maxillary sinus and the left frontal sinus mucosal thickening.  MRA HEAD FINDINGS  The visualized distal vertebral arteries are patent. Signal loss in the proximal intracranial vertebral arteries is again seen and may be artifactual, although proximal left V4 stenosis is not excluded. There is very mild focal narrowing of the distal left vertebral artery immediately proximal to the vertebrobasilar junction, much less pronounced than on the prior MRA. PICA origins are not identified. Basilar artery is patent without stenosis. Right AICA is identified. SCAs are patent. PCA origins are patent. There are bilateral posterior communicating arteries, right larger than left. There is improved flow related enhancement within the P2 segments bilaterally, with only mild irregularity noted as well as a mild to moderate focal stenosis involving the left proximal P2 segment.  Internal carotid arteries are patent from skullbase to carotid termini. ACA and MCA origins are patent. ACAs are unremarkable. Proximal MCAs are unremarkable, with similar appearance of mild narrowing of distal MCA branches bilaterally. No intracranial aneurysm is identified.  IMPRESSION: 1. 4 mm acute infarct in the right pons with possible 2 mm acute infarct in the left pons. 2. Unchanged appearance of moderate chronic small vessel ischemic disease. 3. Improved flow in the distal left vertebral artery and bilateral PCAs. No evidence of major intracranial arterial occlusion.   Electronically Signed   By: Logan Bores   On: 11/21/2013 12:30   Mr Brain Wo Contrast  11/21/2013   CLINICAL DATA:  Stroke.  New onset of left-sided numbness.  EXAM: MRI HEAD WITHOUT CONTRAST  MRA HEAD WITHOUT CONTRAST  TECHNIQUE: Multiplanar, multiecho pulse sequences of the brain and surrounding structures were obtained without intravenous contrast. Angiographic images of the head were obtained using MRA technique without contrast.  COMPARISON:  Head CT 11/20/2013 and brain MRI  and MRA 08/22/2013  FINDINGS: MRI HEAD FINDINGS  There is a 4 mm focus of restricted diffusion in the right aspect of the pons. There is also a 2 mm focus of likely restricted diffusion within the left pons. There is no other evidence of acute infarct. Periventricular white matter T2 hyperintensities are unchanged and compatible with moderate chronic small vessel ischemic disease. Remote infarcts are noted in the basal ganglia, thalami and the  left cerebellum. There is moderate cerebral atrophy. There is no evidence of mass, midline shift, intracranial hemorrhage, or extra-axial fluid collection. Orbits are unremarkable. Extensive bilateral ethmoid air cell mucosal thickening is noted in addition to mild to moderate left greater than right maxillary sinus and the left frontal sinus mucosal thickening.  MRA HEAD FINDINGS  The visualized distal vertebral arteries are patent. Signal loss in the proximal intracranial vertebral arteries is again seen and may be artifactual, although proximal left V4 stenosis is not excluded. There is very mild focal narrowing of the distal left vertebral artery immediately proximal to the vertebrobasilar junction, much less pronounced than on the prior MRA. PICA origins are not identified. Basilar artery is patent without stenosis. Right AICA is identified. SCAs are patent. PCA origins are patent. There are bilateral posterior communicating arteries, right larger than left. There is improved flow related enhancement within the P2 segments bilaterally, with only mild irregularity noted as well as a mild to moderate focal stenosis involving the left proximal P2 segment.  Internal carotid arteries are patent from skullbase to carotid termini. ACA and MCA origins are patent. ACAs are unremarkable. Proximal MCAs are unremarkable, with similar appearance of mild narrowing of distal MCA branches bilaterally. No intracranial aneurysm is identified.  IMPRESSION: 1. 4 mm acute infarct in the right  pons with possible 2 mm acute infarct in the left pons. 2. Unchanged appearance of moderate chronic small vessel ischemic disease. 3. Improved flow in the distal left vertebral artery and bilateral PCAs. No evidence of major intracranial arterial occlusion.   Electronically Signed   By: Logan Bores   On: 11/21/2013 12:30     Brain MRI is reviewed in person and shows an acute infarct involving the right pontine area. I could not appreciate the possible infarct on the contralateral side. The patient has moderate confluent chronic leukoencephalopathy.   Tiasha Helvie A. Merlene Laughter, M.D.  Diplomate, [employer] adviser of Psychiatry and Neurology ( Neurology). 11/21/2013, 1:05 PM

## 2013-11-21 NOTE — Progress Notes (Signed)
PT SEEN and examined. Agree with Ms Santiago Glad Black's note.  Appreciate neurology recommendations.  Await further stroke work up.    [name], MD  [phone]

## 2013-11-21 NOTE — H&P (Signed)
PCP:   Vic Blackbird, MD   Chief Complaint:  Left side numbness  HPI: 73 year old male who   has a past medical history of Hypertension; Coronary artery disease; Diabetes mellitus; C. 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 Today presented to the ED with numbness of the left side. Patient was seen earlier yesterday morning when he had similar symptoms which resolved in the ED and patient was discharged home. Patient came back with worsening of the numbness and on walking left side of the face left arm and left leg with dizziness and difficulty with walking He denies slurred speech, admits to having some blurred vision. Denies seizure activity. Patient has history of CVA in last stroke was in October he is currently taking baby aspirin and Plavix together. In the ED patient had a CT scan of the head which does not show any acute abnormality  Allergies:  No Known Allergies    Past Medical History  Diagnosis Date  . Hypertension   . Coronary artery disease   . Diabetes mellitus   . C. difficile diarrhea   . Peripheral vascular disease   . 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 Glaucoma   . GERD (gastroesophageal reflux disease)   . Stroke 2008  . Sleep apnea     Past Surgical History  Procedure Laterality Date  . Cholecystectomy    . Coronary artery bypass graft    . Thyroid surgery    . No past surgeries      Prior to Admission medications   Medication Sig Start Date End Date Taking? Authorizing Provider  amLODipine (NORVASC) 10 MG tablet Take 10 mg by mouth daily.    Historical Provider, MD  aspirin 81 MG chewable tablet Chew 1 tablet (81 mg total) by mouth daily. 08/24/13   Eugenie Filler, MD  atorvastatin (LIPITOR) 80 MG tablet Take 40 mg by mouth daily.     Historical Provider, MD   brimonidine (ALPHAGAN) 0.2 % ophthalmic solution Place 1 drop into both eyes 3 (three) times daily.    Historical Provider, MD  clopidogrel (PLAVIX) 75 MG tablet Take 1 tablet (75 mg total) by mouth daily. 08/24/13   Eugenie Filler, MD  dorzolamide-timolol (COSOPT) 22.3-6.8 MG/ML ophthalmic solution Place 1 drop into both eyes 2 (two) times daily.    Historical Provider, MD  insulin aspart protamine-insulin aspart (NOVOLOG 70/30) (70-30) 100 UNIT/ML injection Inject into the skin 2 (two) times daily with a meal. Inject 40 units in AM and 50 unit in PM    Historical Provider, MD  latanoprost (XALATAN) 0.005 % ophthalmic solution Place 1 drop into both eyes at bedtime.    Historical Provider, MD  losartan (COZAAR) 100 MG tablet Take 100 mg by mouth daily.      Historical Provider, MD  metFORMIN (GLUCOPHAGE) 1000 MG tablet Take 1,000 mg by mouth 2 (two) times daily with a meal.      Historical Provider, MD  metoprolol (LOPRESSOR) 50 MG tablet Take 0.5 tablets (25 mg total) by mouth 2 (two) times daily. FOR HEART/BLOOD PRESSURE. HOLD FOR SYSTOLIC BLOOD PRESSURE LESS THAN 1OO/HEART RATE LESS THAN 60 05/20/11   Annita Brod, MD  pantoprazole (PROTONIX) 40 MG tablet Take 40 mg by mouth daily.    Historical Provider, MD  pioglitazone (ACTOS) 30 MG tablet Take 30 mg by mouth daily.  Historical Provider, MD  spironolactone (ALDACTONE) 25 MG tablet Take 25 mg by mouth daily.      Historical Provider, MD  tamsulosin (FLOMAX) 0.4 MG CAPS Take 0.4 mg by mouth daily.     Historical Provider, MD    Social History:  reports that he has quit smoking. He has never used smokeless tobacco. He reports that he does not drink alcohol or use illicit drugs.  Family History  Problem Relation Age of Onset  . Diabetes Mother   . Heart failure Mother   . Hypertension Mother   . Diabetes Father   . Heart failure Father   . Hypertension Father   . Diabetes Sister   . Heart failure Sister   . Hypertension  Sister   . Hyperlipidemia Sister   . Diabetes Brother   . Heart failure Brother   . Hypertension Brother      All the positives are listed in BOLD  Review of Systems:  HEENT: Headache, blurred vision, runny nose, sore throat Neck: Hypothyroidism, hyperthyroidism,,lymphadenopathy Chest : Shortness of breath, history of COPD, Asthma Heart : Chest pain, history of coronary arterey disease GI:  Nausea, vomiting, diarrhea, constipation, GERD GU: Dysuria, urgency, frequency of urination, hematuria Neuro: Stroke, seizures, syncope Psych: Depression, anxiety, hallucinations   Physical Exam: Blood pressure 164/57, pulse 71, temperature 98.1 F (36.7 C), resp. rate 20, SpO2 96.00%. Constitutional:   Patient is a well-developed and well-nourished male* in no acute distress and cooperative with exam. Head: Normocephalic and atraumatic Mouth: Mucus membranes moist Eyes: PERRL, EOMI, conjunctivae normal Neck: Supple, No Thyromegaly Cardiovascular: RRR, S1 normal, S2 normal Pulmonary/Chest: CTAB, no wheezes, rales, or rhonchi Abdominal: Soft. Non-tender, non-distended, bowel sounds are normal, no masses, organomegaly, or guarding present.  Neurological: A&O x3, Strenght is normal and symmetric bilaterally, cranial nerve II-XII are grossly intact, no focal motor deficit, reduced sensations on left side. Gait not tested Extremities : No Cyanosis, Clubbing or Edema   Labs on Admission:  Results for orders placed during the hospital encounter of 11/20/13 (from the past 48 hour(s))  ETHANOL     Status: None   Collection Time    11/20/13 11:42 AM      Result Value Range   Alcohol, Ethyl (B) <11  0 - 11 mg/dL   Comment:            LOWEST DETECTABLE LIMIT FOR     SERUM ALCOHOL IS 11 mg/dL     FOR MEDICAL PURPOSES ONLY  PROTIME-INR     Status: None   Collection Time    11/20/13 11:42 AM      Result Value Range   Prothrombin Time 13.4  11.6 - 15.2 seconds   INR 1.04  0.00 - 1.49  APTT      Status: None   Collection Time    11/20/13 11:42 AM      Result Value Range   aPTT 29  24 - 37 seconds  CBC     Status: None   Collection Time    11/20/13 11:42 AM      Result Value Range   WBC 6.7  4.0 - 10.5 K/uL   RBC 4.40  4.22 - 5.81 MIL/uL   Hemoglobin 13.4  13.0 - 17.0 g/dL   HCT 40.5  39.0 - 52.0 %   MCV 92.0  78.0 - 100.0 fL   MCH 30.5  26.0 - 34.0 pg   MCHC 33.1  30.0 - 36.0 g/dL   RDW 13.4  11.5 - 15.5 %   Platelets 201  150 - 400 K/uL  DIFFERENTIAL     Status: None   Collection Time    11/20/13 11:42 AM      Result Value Range   Neutrophils Relative % 60  43 - 77 %   Neutro Abs 4.0  1.7 - 7.7 K/uL   Lymphocytes Relative 29  12 - 46 %   Lymphs Abs 1.9  0.7 - 4.0 K/uL   Monocytes Relative 9  3 - 12 %   Monocytes Absolute 0.6  0.1 - 1.0 K/uL   Eosinophils Relative 2  0 - 5 %   Eosinophils Absolute 0.1  0.0 - 0.7 K/uL   Basophils Relative 0  0 - 1 %   Basophils Absolute 0.0  0.0 - 0.1 K/uL  COMPREHENSIVE METABOLIC PANEL     Status: Abnormal   Collection Time    11/20/13 11:42 AM      Result Value Range   Sodium 141  137 - 147 mEq/L   Potassium 3.8  3.7 - 5.3 mEq/L   Chloride 103  96 - 112 mEq/L   CO2 27  19 - 32 mEq/L   Glucose, Bld 107 (*) 70 - 99 mg/dL   BUN 15  6 - 23 mg/dL   Creatinine, Ser 0.96  0.50 - 1.35 mg/dL   Calcium 9.1  8.4 - 10.5 mg/dL   Total Protein 7.6  6.0 - 8.3 g/dL   Albumin 3.4 (*) 3.5 - 5.2 g/dL   AST 17  0 - 37 U/L   ALT 16  0 - 53 U/L   Alkaline Phosphatase 53  39 - 117 U/L   Total Bilirubin 0.6  0.3 - 1.2 mg/dL   GFR calc non Af Amer 82 (*) >90 mL/min   GFR calc Af Amer >90  >90 mL/min   Comment: (NOTE)     The eGFR has been calculated using the CKD EPI equation.     This calculation has not been validated in all clinical situations.     eGFR's persistently <90 mL/min signify possible Chronic Kidney     Disease.  TROPONIN I     Status: None   Collection Time    11/20/13 11:42 AM      Result Value Range   Troponin I <0.30   <0.30 ng/mL   Comment:            Due to the release kinetics of cTnI,     a negative result within the first hours     of the onset of symptoms does not rule out     myocardial infarction with certainty.     If myocardial infarction is still suspected,     repeat the test at appropriate intervals.  POCT I-STAT TROPONIN I     Status: None   Collection Time    11/20/13 11:46 AM      Result Value Range   Troponin i, poc 0.00  0.00 - 0.08 ng/mL   Comment 3            Comment: Due to the release kinetics of cTnI,     a negative result within the first hours     of the onset of symptoms does not rule out     myocardial infarction with certainty.     If myocardial infarction is still suspected,     repeat the test at appropriate intervals.  URINE RAPID DRUG SCREEN (HOSP PERFORMED)  Status: None   Collection Time    11/20/13  1:04 PM      Result Value Range   Opiates NONE DETECTED  NONE DETECTED   Cocaine NONE DETECTED  NONE DETECTED   Benzodiazepines NONE DETECTED  NONE DETECTED   Amphetamines NONE DETECTED  NONE DETECTED   Tetrahydrocannabinol NONE DETECTED  NONE DETECTED   Barbiturates NONE DETECTED  NONE DETECTED   Comment:            DRUG SCREEN FOR MEDICAL PURPOSES     ONLY.  IF CONFIRMATION IS NEEDED     FOR ANY PURPOSE, NOTIFY LAB     WITHIN 5 DAYS.                LOWEST DETECTABLE LIMITS     FOR URINE DRUG SCREEN     Drug Class       Cutoff (ng/mL)     Amphetamine      1000     Barbiturate      200     Benzodiazepine   469     Tricyclics       629     Opiates          300     Cocaine          300     THC              50  URINALYSIS, ROUTINE W REFLEX MICROSCOPIC     Status: None   Collection Time    11/20/13  1:04 PM      Result Value Range   Color, Urine YELLOW  YELLOW   APPearance CLEAR  CLEAR   Specific Gravity, Urine 1.015  1.005 - 1.030   pH 7.0  5.0 - 8.0   Glucose, UA NEGATIVE  NEGATIVE mg/dL   Hgb urine dipstick NEGATIVE  NEGATIVE   Bilirubin Urine  NEGATIVE  NEGATIVE   Ketones, ur NEGATIVE  NEGATIVE mg/dL   Protein, ur NEGATIVE  NEGATIVE mg/dL   Urobilinogen, UA 1.0  0.0 - 1.0 mg/dL   Nitrite NEGATIVE  NEGATIVE   Leukocytes, UA NEGATIVE  NEGATIVE   Comment: MICROSCOPIC NOT DONE ON URINES WITH NEGATIVE PROTEIN, BLOOD, LEUKOCYTES, NITRITE, OR GLUCOSE <1000 mg/dL.    Radiological Exams on Admission: Ct Head Wo Contrast  11/21/2013   CLINICAL DATA:  Left hand and foot numbness. Left face numbness and dizziness.  EXAM: CT HEAD WITHOUT CONTRAST  TECHNIQUE: Contiguous axial images were obtained from the base of the skull through the vertex without intravenous contrast.  COMPARISON:  Earlier today at 1200 hr.  FINDINGS: 2354 hr.  Sinuses/Soft tissues: Mucosal thickening of the left maxillary sinus and bilateral ethmoid air cells. Minimal frontal sinus mucosal thickening. Clear mastoid air cells.  Intracranial: Mild low density in the periventricular white matter likely related to small vessel disease. Right thalamic and centrum semi ovale lacunar infarcts which are remote. No mass lesion, hemorrhage, hydrocephalus, acute infarct, intra-axial, or extra-axial fluid collection. Vertebral and carotid atherosclerosis.  IMPRESSION: 1.  No acute intracranial abnormality. 2. Sinus disease. 3. Remote  lacunar infarcts with small vessel ischemic change.   Electronically Signed   By: Abigail Miyamoto M.D.   On: 11/21/2013 00:10   Ct Head Wo Contrast  11/20/2013   CLINICAL DATA:  Left-sided numbness with onset 3 hr ago. History of stroke.  EXAM: CT HEAD WITHOUT CONTRAST  TECHNIQUE: Contiguous axial images were obtained from the base of the skull through  the vertex without intravenous contrast.  COMPARISON:  Brain MRI 08/22/2013  FINDINGS: Old lacunar infarcts are seen in the right centrum semiovale and bilateral thalamus. There is no evidence of acute large territory cortical infarct, mass, midline shift, intracranial hemorrhage, or extra-axial fluid collection.  Age-related cerebral atrophy is present. Periventricular white-matter hypodensities are compatible with moderate chronic small vessel ischemic disease. The orbits are unremarkable. Mastoid air cells are clear. Moderate bilateral ethmoid and left maxillary sinus mucosal thickening are noted. There is mild left frontal and right maxillary sinus mucosal thickening.  IMPRESSION: No evidence of acute intracranial abnormality.   Electronically Signed   By: Logan Bores   On: 11/20/2013 13:08    Assessment/Plan Principal Problem:   Stroke Active Problems:   HTN (hypertension)   Diabetes mellitus type II, uncontrolled   CAD (coronary artery disease)  Left side numbness Likely due to CVA, will admit the patient and initiate stroke protocol Will check MRI/MRA of the brain in a.m. Hemoglobin A1c, lipid profile in a.m. Neurology consultation a.m. We'll continue him on Plavix and aspirin 81 mg at this time  Hypertension Will hold the metoprolol and Cozaar for permissive hypertension  CAD status post CABG Continue aspirin, Plavix, Lipitor, Aldactone  Diabetes mellitus Continue insulin 70/30 40 units twice a day Initiate sliding scale insulin  DVT prophylaxis Lovenox  Code status: Patient is full code  Family discussion: Discussed with patient's wife at bedside   Time Spent on Admission: 50 minutes  [facility] Pager: [phone] 11/21/2013, 1:15 AM  If 7PM-7AM, please contact night-coverage  [email or web address]  Password [email or web address]

## 2013-11-21 NOTE — Progress Notes (Signed)
[facility] PROGRESS NOTE  DONAVEN SPIKES [id number] DOB: [birth year] DOA: 11/20/2013 PCP: Vic Blackbird, MD  Assessment/Plan: Left side numbness   MRI/MRA of the brain 4 mm acute infarct in the right pons with possible 2 mm acute infarct in the left pons. Hemoglobin A1c in process this am. Lipid profile with HDL 37 otherwise unremarkable. Await echo and carotid doppler. Await PT evaluation.  Await neurology consult. Continue plavix and asa 81mg .  Hypertension   SBP range 145-177. Holding metoprolol and Cozaar for permissive hypertension  CAD status post CABG  Continue aspirin, Plavix, Lipitor, Aldactone  Diabetes mellitus   CBG range 49-138. Continue insulin 70/30 40 units twice a day. Use SSI when needed. Await HgA1c.  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  1.   Code Status: full Family Communication: none present Disposition Plan: home when ready   Consultants:  neurology  Procedures:  none  Antibiotics:  none  HPI/Subjective: Sitting up in bed eating breakfast. Reports continued left sided numbness. Denies pain/weakness  Objective: Filed Vitals:   11/21/13 0628  BP: 177/63  Pulse: 57  Temp: 97.4 F (36.3 C)  Resp: 16   No intake or output data in the 24 hours ending 11/21/13 1244 Filed Weights   11/21/13 0219  Weight: 127.5 kg (281 lb 1.4 oz)    Exam:   General:  Obese NAD  Cardiovascular: RRR No MGR No LE edema  Respiratory: normal effort BS clear bilaterally no wheeze  Abdomen: obese soft +BS non-distended non-tender to palpation  Musculoskeletal: no clubbing or cyanosis.   Neuro: speech clear facial symmetry bilateral grip 5/5 Bilateral LE strength 5/5   Data Reviewed: Basic Metabolic Panel:  Recent Labs Lab 11/20/13 1142  NA 141  K 3.8  CL 103  CO2 27  GLUCOSE 107*  BUN 15  CREATININE 0.96  CALCIUM 9.1   Liver Function Tests:  Recent Labs Lab 11/20/13 1142  AST 17  ALT 16  ALKPHOS 53  BILITOT 0.6  PROT 7.6   ALBUMIN 3.4*   No results found for this basename: LIPASE, AMYLASE,  in the last 168 hours No results found for this basename: AMMONIA,  in the last 168 hours CBC:  Recent Labs Lab 11/20/13 1142  WBC 6.7  NEUTROABS 4.0  HGB 13.4  HCT 40.5  MCV 92.0  PLT 201   Cardiac Enzymes:  Recent Labs Lab 11/20/13 1142  TROPONINI <0.30   BNP (last 3 results) No results found for this basename: PROBNP,  in the last 8760 hours CBG:  Recent Labs Lab 11/21/13 0258 11/21/13 0438 11/21/13 0741 11/21/13 0913 11/21/13 1141  GLUCAP 73 70 49* 84 138*    No results found for this or any previous visit (from the past 240 hour(s)).   Studies: Dg Chest 2 View  11/21/2013   CLINICAL DATA:  Left-sided weakness.  EXAM: CHEST  2 VIEW  COMPARISON:  Chest radiograph performed 08/22/2013  FINDINGS: The lungs are well-aerated. Pulmonary vascularity is at the upper limits of normal. There is no evidence of focal opacification, pleural effusion or pneumothorax.  The heart is borderline normal in size. The patient is status post median sternotomy. No acute osseous abnormalities are seen.  IMPRESSION: No acute cardiopulmonary process seen.   Electronically Signed   By: Garald Balding M.D.   On: 11/21/2013 01:56   Ct Head Wo Contrast  11/21/2013   CLINICAL DATA:  Left hand and foot numbness. Left face numbness and dizziness.  EXAM: CT HEAD WITHOUT  CONTRAST  TECHNIQUE: Contiguous axial images were obtained from the base of the skull through the vertex without intravenous contrast.  COMPARISON:  Earlier today at 1200 hr.  FINDINGS: 2354 hr.  Sinuses/Soft tissues: Mucosal thickening of the left maxillary sinus and bilateral ethmoid air cells. Minimal frontal sinus mucosal thickening. Clear mastoid air cells.  Intracranial: Mild low density in the periventricular white matter likely related to small vessel disease. Right thalamic and centrum semi ovale lacunar infarcts which are remote. No mass lesion, hemorrhage,  hydrocephalus, acute infarct, intra-axial, or extra-axial fluid collection. Vertebral and carotid atherosclerosis.  IMPRESSION: 1.  No acute intracranial abnormality. 2. Sinus disease. 3. Remote  lacunar infarcts with small vessel ischemic change.   Electronically Signed   By: Abigail Miyamoto M.D.   On: 11/21/2013 00:10   Ct Head Wo Contrast  11/20/2013   CLINICAL DATA:  Left-sided numbness with onset 3 hr ago. History of stroke.  EXAM: CT HEAD WITHOUT CONTRAST  TECHNIQUE: Contiguous axial images were obtained from the base of the skull through the vertex without intravenous contrast.  COMPARISON:  Brain MRI 08/22/2013  FINDINGS: Old lacunar infarcts are seen in the right centrum semiovale and bilateral thalamus. There is no evidence of acute large territory cortical infarct, mass, midline shift, intracranial hemorrhage, or extra-axial fluid collection. Age-related cerebral atrophy is present. Periventricular white-matter hypodensities are compatible with moderate chronic small vessel ischemic disease. The orbits are unremarkable. Mastoid air cells are clear. Moderate bilateral ethmoid and left maxillary sinus mucosal thickening are noted. There is mild left frontal and right maxillary sinus mucosal thickening.  IMPRESSION: No evidence of acute intracranial abnormality.   Electronically Signed   By: Logan Bores   On: 11/20/2013 13:08   Mr Jodene Nam Head Wo Contrast  11/21/2013   CLINICAL DATA:  Stroke.  New onset of left-sided numbness.  EXAM: MRI HEAD WITHOUT CONTRAST  MRA HEAD WITHOUT CONTRAST  TECHNIQUE: Multiplanar, multiecho pulse sequences of the brain and surrounding structures were obtained without intravenous contrast. Angiographic images of the head were obtained using MRA technique without contrast.  COMPARISON:  Head CT 11/20/2013 and brain MRI and MRA 08/22/2013  FINDINGS: MRI HEAD FINDINGS  There is a 4 mm focus of restricted diffusion in the right aspect of the pons. There is also a 2 mm focus of likely  restricted diffusion within the left pons. There is no other evidence of acute infarct. Periventricular white matter T2 hyperintensities are unchanged and compatible with moderate chronic small vessel ischemic disease. Remote infarcts are noted in the basal ganglia, thalami and the left cerebellum. There is moderate cerebral atrophy. There is no evidence of mass, midline shift, intracranial hemorrhage, or extra-axial fluid collection. Orbits are unremarkable. Extensive bilateral ethmoid air cell mucosal thickening is noted in addition to mild to moderate left greater than right maxillary sinus and the left frontal sinus mucosal thickening.  MRA HEAD FINDINGS  The visualized distal vertebral arteries are patent. Signal loss in the proximal intracranial vertebral arteries is again seen and may be artifactual, although proximal left V4 stenosis is not excluded. There is very mild focal narrowing of the distal left vertebral artery immediately proximal to the vertebrobasilar junction, much less pronounced than on the prior MRA. PICA origins are not identified. Basilar artery is patent without stenosis. Right AICA is identified. SCAs are patent. PCA origins are patent. There are bilateral posterior communicating arteries, right larger than left. There is improved flow related enhancement within the P2 segments bilaterally, with only  mild irregularity noted as well as a mild to moderate focal stenosis involving the left proximal P2 segment.  Internal carotid arteries are patent from skullbase to carotid termini. ACA and MCA origins are patent. ACAs are unremarkable. Proximal MCAs are unremarkable, with similar appearance of mild narrowing of distal MCA branches bilaterally. No intracranial aneurysm is identified.  IMPRESSION: 1. 4 mm acute infarct in the right pons with possible 2 mm acute infarct in the left pons. 2. Unchanged appearance of moderate chronic small vessel ischemic disease. 3. Improved flow in the distal  left vertebral artery and bilateral PCAs. No evidence of major intracranial arterial occlusion.   Electronically Signed   By: Logan Bores   On: 11/21/2013 12:30   Mr Brain Wo Contrast  11/21/2013   CLINICAL DATA:  Stroke.  New onset of left-sided numbness.  EXAM: MRI HEAD WITHOUT CONTRAST  MRA HEAD WITHOUT CONTRAST  TECHNIQUE: Multiplanar, multiecho pulse sequences of the brain and surrounding structures were obtained without intravenous contrast. Angiographic images of the head were obtained using MRA technique without contrast.  COMPARISON:  Head CT 11/20/2013 and brain MRI and MRA 08/22/2013  FINDINGS: MRI HEAD FINDINGS  There is a 4 mm focus of restricted diffusion in the right aspect of the pons. There is also a 2 mm focus of likely restricted diffusion within the left pons. There is no other evidence of acute infarct. Periventricular white matter T2 hyperintensities are unchanged and compatible with moderate chronic small vessel ischemic disease. Remote infarcts are noted in the basal ganglia, thalami and the left cerebellum. There is moderate cerebral atrophy. There is no evidence of mass, midline shift, intracranial hemorrhage, or extra-axial fluid collection. Orbits are unremarkable. Extensive bilateral ethmoid air cell mucosal thickening is noted in addition to mild to moderate left greater than right maxillary sinus and the left frontal sinus mucosal thickening.  MRA HEAD FINDINGS  The visualized distal vertebral arteries are patent. Signal loss in the proximal intracranial vertebral arteries is again seen and may be artifactual, although proximal left V4 stenosis is not excluded. There is very mild focal narrowing of the distal left vertebral artery immediately proximal to the vertebrobasilar junction, much less pronounced than on the prior MRA. PICA origins are not identified. Basilar artery is patent without stenosis. Right AICA is identified. SCAs are patent. PCA origins are patent. There are  bilateral posterior communicating arteries, right larger than left. There is improved flow related enhancement within the P2 segments bilaterally, with only mild irregularity noted as well as a mild to moderate focal stenosis involving the left proximal P2 segment.  Internal carotid arteries are patent from skullbase to carotid termini. ACA and MCA origins are patent. ACAs are unremarkable. Proximal MCAs are unremarkable, with similar appearance of mild narrowing of distal MCA branches bilaterally. No intracranial aneurysm is identified.  IMPRESSION: 1. 4 mm acute infarct in the right pons with possible 2 mm acute infarct in the left pons. 2. Unchanged appearance of moderate chronic small vessel ischemic disease. 3. Improved flow in the distal left vertebral artery and bilateral PCAs. No evidence of major intracranial arterial occlusion.   Electronically Signed   By: Logan Bores   On: 11/21/2013 12:30    Scheduled Meds: . amLODipine  10 mg Oral Daily  . aspirin  81 mg Oral Daily  . atorvastatin  40 mg Oral QPC supper  . brimonidine  1 drop Both Eyes TID  . clopidogrel  75 mg Oral Daily  . dorzolamide-timolol  1 drop Both Eyes BID  . enoxaparin (LOVENOX) injection  40 mg Subcutaneous Q24H  . insulin aspart  0-9 Units Subcutaneous TID WC  . insulin aspart protamine- aspart  40 Units Subcutaneous BID WC  . latanoprost  1 drop Both Eyes QHS  . pantoprazole  40 mg Oral Daily  . spironolactone  25 mg Oral Daily  . tamsulosin  0.4 mg Oral Daily   Continuous Infusions:   Principal Problem:   Stroke Active Problems:   HTN (hypertension)   Diabetes mellitus type II, uncontrolled   CAD (coronary artery disease)   Hyperlipidemia   Obesity   Chronic kidney disease    Time spent: 30 minutes    [facility] Pager [phone]. If 7PM-7AM, please contact night-coverage at [email or web address], password [facility] 11/21/2013, 12:44 PM  LOS: 1 day

## 2013-11-21 NOTE — Care Management Note (Signed)
    Page 1 of 1   11/22/2013     12:02:01 PM   CARE MANAGEMENT NOTE 11/22/2013  Patient:  FRASIER, SPRINGBORN   Account Number:  [id number]  Date Initiated:  11/21/2013  Documentation initiated by:  Claretha Cooper  Subjective/Objective Assessment:   Pt lives at home with his wife. Wife states that this is the 4th event for pt. Pt and wife agree that he may benefit from HH/DME. See Dr. Cassell Clement in [place] at the [facility] clinic.     Action/Plan:   Left message for Dr/Va to call and give guidance as to how to order equipment and HH.   Anticipated DC Date:     Anticipated DC Plan:        DC Planning Services  CM consult      Choice offered to / List presented to:             Status of service:  Completed, signed off Medicare Important Message given?  NA - LOS <3 / Initial given by admissions (If response is "NO", the following Medicare IM given date fields will be blank) Date Medicare IM given:   Date Additional Medicare IM given:    Discharge Disposition:  HOME/SELF CARE  Per UR Regulation:    If discussed at Long Length of Stay Meetings, dates discussed:    Comments:  11/22/13 Claretha Cooper RN BSN CM Order for [employer] PT faxed to [facility]. Pt is aware that they will call them. Advised that tub bench is not covered by insurance and they can pick one up at [place] or a DME company such as [employer]  11/21/13 Meyer Arora Dellia Nims RN BSN CM

## 2013-11-22 DIAGNOSIS — E1165 Type 2 diabetes mellitus with hyperglycemia: Secondary | ICD-10-CM

## 2013-11-22 DIAGNOSIS — N189 Chronic kidney disease, unspecified: Secondary | ICD-10-CM

## 2013-11-22 DIAGNOSIS — IMO0001 Reserved for inherently not codable concepts without codable children: Secondary | ICD-10-CM

## 2013-11-22 DIAGNOSIS — I517 Cardiomegaly: Secondary | ICD-10-CM

## 2013-11-22 DIAGNOSIS — I2510 Atherosclerotic heart disease of native coronary artery without angina pectoris: Secondary | ICD-10-CM

## 2013-11-22 LAB — GLUCOSE, CAPILLARY
Glucose-Capillary: 117 mg/dL — ABNORMAL HIGH (ref 70–99)
Glucose-Capillary: 176 mg/dL — ABNORMAL HIGH (ref 70–99)

## 2013-11-22 LAB — RPR: RPR Ser Ql: NONREACTIVE

## 2013-11-22 LAB — TSH: TSH: 1.745 u[IU]/mL (ref 0.350–4.500)

## 2013-11-22 LAB — HOMOCYSTEINE: Homocysteine: 10 umol/L (ref 4.0–15.4)

## 2013-11-22 LAB — VITAMIN B12: Vitamin B-12: 363 pg/mL (ref 211–911)

## 2013-11-22 MED ORDER — ASPIRIN-DIPYRIDAMOLE ER 25-200 MG PO CP12
ORAL_CAPSULE | ORAL | Status: DC
Start: 2013-11-22 — End: 2014-03-26

## 2013-11-22 MED ORDER — ASPIRIN-DIPYRIDAMOLE ER 25-200 MG PO CP12
ORAL_CAPSULE | ORAL | Status: DC
Start: 2013-11-22 — End: 2013-11-22

## 2013-11-22 MED ORDER — STROKE: EARLY STAGES OF RECOVERY BOOK
Freq: Once | Status: DC
  Filled 2013-11-22: qty 1

## 2013-11-22 MED ORDER — ASPIRIN 81 MG PO CHEW: 81.0000 mg | CHEWABLE_TABLET | Freq: Every day | ORAL | Status: AC

## 2013-11-22 NOTE — Evaluation (Signed)
Occupational Therapy Evaluation Patient Details Name: [name] MRN: [id number] DOB: [birth year] Today's Date: 11/22/2013 Time: 0825-0850 OT Time Calculation (min): 25 min Evaluation [id number] (25')  OT Assessment / Plan / Recommendation History of present illness This is a 73 year old right handed male who presents with acute onset of numbness involving the left upper extremity and left lower extremity. He was seen in the emergency room earlier the day of admissionwith similiar symptoms however he was discharged after returning close normal.  He returned later that day with returning left-sided numbness and tingling this time even worse than before.  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     Clinical Impression   Patient demos [facility] range, gross and fine motor coordination  with LUE with minimal noted weakness and reports resolved sensory issues at this time.  He demos ability to perform ADL tasks at/near baseline.  Recommend tub bench and long handled shoe horn for increased independence and safety with tasks.  Also recommend dietary/nutrition consult for healthier eating habits to decreased risks.  Recommend patient cont with exercises recommended from last admission for UE strengthening.  Patient/wife verbalize good understanding and patient with return demo of recommended exercises.  Recommend no skilled OT services at this time.      OT Assessment  Patient does not need any further OT services (recommend patient continue to utilize BUE for tasks and exercises given from last hospitalization to increase LUE strength.  Patient return demos exercises and patient /wife verballize good understanding. )    Follow Up Recommendations  No OT follow up    Barriers to Discharge  excellent family support    Equipment Recommendations  Tub/shower bench (long handled shoe horn.  )    Recommendations for Other Services Other (comment) (dietary/nutrition consult for health eating  habits)  Frequency       Precautions / Restrictions Precautions Precautions: None Restrictions Weight Bearing Restrictions: No       ADL  Eating/Feeding: Modified independent (dentures) Upper Body Dressing: Set up Lower Body Dressing: Supervision/safety (utilizes sock aides for home and reports slight dizziness bending over for shoes.  ) Toileting - Clothing Manipulation and Hygiene: Modified independent ADL Comments: recommend long handled shoe horn for home and discussed tub bench for safety     OT Diagnosis:   CVA, weakness  OT Goals(Current goals can be found in the care plan section) Acute Rehab OT Goals Patient Stated Goal: none stated  Visit Information  History of Present Illness: This is a 73 year old right handed male who presents with acute onset of numbness involving the left upper extremity and left lower extremity. He was seen in the emergency room earlier the day of admissionwith similiar symptoms however he was discharged after returning close normal.  He returned later that day with returning left-sided numbness and tingling this time even worse than before.  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         Prior Functioning     Home Living Living Arrangements: Spouse/significant other Available Help at Discharge: Family Type of Home: House Home Access: Level entry Home Layout: Two level;Other (Comment);Able to live on main level with bedroom/bathroom Home Equipment: Kasandra Knudsen - single point;Shower seat;Bedside commode (sock aide) Additional Comments: discussed tub bench and long handled shoe horn with patient and wife as both report he does not use shower chair as he does not feel comfortable/safe on it.   Prior Function Level of Independence: Independent with  assistive device(s)  Patient with limited driving since his second stroke per report.  Comments: intermittent use of cane;  sock aide for donning socks Communication Communication: No  difficulties (slight noting of slurring speech patient reports is from dentures. ) Dominant Hand: Right         Vision/Perception Vision - History Baseline Vision: Wears glasses all the time Patient Visual Report: No change from baseline (patient demos abilty to grossly track in all quads;  reports do difficulties or difference from PTA)   Cognition  Cognition Arousal/Alertness: Awake/alert Behavior During Therapy: WFL for tasks assessed/performed Overall Cognitive Status: Within Functional Limits for tasks assessed    Extremity/Trunk Assessment Upper Extremity Assessment Upper Extremity Assessment: Generalized weakness (patient demos WFL range and 3+/5 strength on LUE.  able to demo full CFF and opposition with no difficulties.  he reports no residual numbness or tingling this date.  ) Lower Extremity Assessment Lower Extremity Assessment: Defer to PT evaluation     Mobility Bed Mobility Overal bed mobility: Modified Independent Transfers Overall transfer level: Modified independent General transfer comment: patient with noted LLE weakness and reports occassionallly feeling like he is going to fall.  Patient for PT evaluation this date.  Please see for additional information            End of Session OT - End of Session Activity Tolerance: Patient tolerated treatment well Patient left: in chair;with call bell/phone within reach;with family/visitor present  [place], OTR/L  11/22/2013, 9:19 AM

## 2013-11-22 NOTE — Evaluation (Signed)
Physical Therapy Evaluation Patient Details Name: [name] MRN: [id number] DOB: 11/10/1940 Today's Date: 11/22/2013 Time: 0933-1005 PT Time Calculation (min): 32 min  PT Assessment / Plan / Recommendation History of Present Illness  Pt was admitted with a right pontine infarct, sx including left sided numbness and gait ataxia.  He has had 3 prior strokes in this same area, each resolving almost completely.  Clinical Impression   Pt is seen for evaluation.  His left sided LE/trunk strength are WNL but he has a very mild balance deficit and now needs a cane for full gait stability.  If this persists, he may wish to receive OP PT.    PT Assessment  All further PT needs can be met in the next venue of care    Follow Up Recommendations  Outpatient PT (if desired by pt)    Does the patient have the potential to tolerate intense rehabilitation      Barriers to Discharge        Equipment Recommendations  None recommended by PT    Recommendations for Other Services     Frequency      Precautions / Restrictions Precautions Precautions: None Restrictions Weight Bearing Restrictions: No   Pertinent Vitals/Pain       Mobility  Bed Mobility Overal bed mobility: Modified Independent Transfers Overall transfer level: Modified independent Equipment used: None General transfer comment: no LLE weakness was not noted during PT eval.   Ambulation/Gait Ambulation/Gait assistance: Modified independent (Device/Increase time) Ambulation Distance (Feet): 200 Feet Assistive device: Straight cane Gait Pattern/deviations: WFL(Within Functional Limits) Gait velocity: WNL General Gait Details: pt has had mild gait instability with no assistive device with slight lean to the left.  Gilmer Mor resolves this abnormality.    Exercises     PT Diagnosis: Abnormality of gait  PT Problem List: Decreased balance;Obesity PT Treatment Interventions:       PT Goals(Current goals can be found in the  care plan section) Acute Rehab PT Goals Patient Stated Goal: none stated PT Goal Formulation: No goals set, d/c therapy  Visit Information  Last PT Received On: 11/22/13 History of Present Illness: Pt was admitted with a right pontine infarct, sx including left sided numbness and gait ataxia.  He has had 3 prior strokes in this same area, each resolving almost completely.       Prior Functioning  Home Living Living Arrangements: Spouse/significant other Available Help at Discharge: Family Type of Home: House Home Access: Level entry Home Layout: Two level;Other (Comment);Able to live on main level with bedroom/bathroom Home Equipment: Gilmer Mor - single point;Shower seat;Bedside commode (sock aide) Additional Comments: discussed tub bench and long handled shoe horn with patient and wife as both report he does not use shower chair as he does not feel comfortable/safe on it.   Prior Function Level of Independence: Independent with assistive device(s) Comments: intermittent use of cane;  sock aide for donning socks Communication Communication: No difficulties (slight noting of slurring speech patient reports is from dentures. ) Dominant Hand: Right    Cognition  Cognition Arousal/Alertness: Awake/alert Behavior During Therapy: WFL for tasks assessed/performed Overall Cognitive Status: Within Functional Limits for tasks assessed    Extremity/Trunk Assessment Upper Extremity Assessment Upper Extremity Assessment: Generalized weakness (patient demos WFL range and 3+/5 strength on LUE.  able to demo full CFF and opposition with no difficulties.  he reports no residual numbness or tingling this date.  ) Lower Extremity Assessment Lower Extremity Assessment: Overall WFL for tasks assessed  Cervical / Trunk Assessment Cervical / Trunk Assessment: Normal   Balance Balance Overall balance assessment: Needs assistance Sitting-balance support: No upper extremity supported;Feet supported Sitting  balance-Leahy Scale: Normal Standing balance support: No upper extremity supported Standing balance-Leahy Scale: Good  End of Session PT - End of Session Equipment Utilized During Treatment: Gait belt Activity Tolerance: Patient tolerated treatment well Patient left: in chair;with call bell/phone within reach  GP     Demetrios Isaacs L 11/22/2013, 10:10 AM

## 2013-11-22 NOTE — Progress Notes (Signed)
11/22/13 1447 Reviewed discharge instructions with patient, wife at bedside. Given copy of AVS, f/u appointments scheduled. Outpatient rehab to contact patient for therapy appointment, contact information provided. Reviewed stroke education booklet with patient via teach-back. Pt verbalizes stroke symptoms and when to call MD via teach-back. No c/o pain or discomfort at time of discharge. Noted when home medications next due on list. IV site d/c'd, within normal limits. Pt left floor in stable condition via w/c accompanied by nurse tech. Donavan Foil, RN

## 2013-11-22 NOTE — Discharge Summary (Signed)
Physician Discharge Summary  [name] [id number] DOB: [birth year] DOA: 11/20/2013  PCP: Vic Blackbird, MD  Admit date: 11/20/2013 Discharge date: 11/22/2013  Time spent: 45 minutes  Recommendations for Outpatient Follow-up:  1. Pt has appointment with PCP 11/23/13. Recommend follow up on diabetes management and BP and weight control 2. Dr. Merlene Laughter in 2 weeks for follow up symptoms  Discharge Diagnoses:  Principal Problem:   Stroke Active Problems:   HTN (hypertension)   Diabetes mellitus type II, uncontrolled   CAD (coronary artery disease)   Hyperlipidemia   Obesity   Chronic kidney disease   Discharge Condition: stable  Diet recommendation: carb modified  Filed Weights   11/21/13 0219  Weight: 127.5 kg (281 lb 1.4 oz)    History of present illness:  73 year old male who has a past medical history of Hypertension; Coronary artery disease; Diabetes mellitus; C. 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 Patient was seen earlier on 11/20/13 in the  morning when he had similar symptoms which resolved in the ED and patient was discharged home. Patient came back with worsening of the numbness and on walking left side of the face left arm and left leg with dizziness and difficulty with walking He denied slurred speech, admited to having some blurred vision. Denied seizure activity. Patient had history of CVA in last stroke was in October he was taking baby aspirin and Plavix together. 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 Risk factors  are obesity, HTN diabetes sleep apnea and dyslipidemia.  Hemoglobin A1c 7.1. Lipid profile with HDL 37 otherwise unremarkable. TSH, Vit B12 and Homocysteine and RPR within limits of normal.  Carotid doppler 10/14 with no significant carotid stenosis. PT evaluation recommend OP PT if patient requests. He was seen by  neurology who opined he has had multiple liquid infarcts. He was switched from plavix to aggrenox. Also recommended better diabetes control, BP control, management of dyslipidemia and continued use of CPAP machine. Recommends follow up with Dr. Merlene Laughter 2-3 weeks Hypertension  SBP range 136-149. Initially held metoprolol and Cozaar for permissive hypertension. Resumed at discharge CAD status post CABG  Continue  Lipitor, Aldactone. Plavix changed to aggrenox Diabetes mellitus  CBG range 117-176. HgA1c 7.1. Nutritional consult for education around diabetes/carb management  CKD stage 1 Remained stable during this hospitalization  Procedures: Echo results pending Consultations:  Dr Merlene Laughter neurology  Discharge Exam: Filed Vitals:   11/22/13 0500  BP: 149/77  Pulse: 64  Temp: 98.3 F (36.8 C)  Resp: 15    General: obese NAD Cardiovascular: RRR No m/g/r no LE edema Respiratory: normal effort BS clear bilaterally but slightly distant no wheeze Neuro: speech clear facial symmetry. Ambulates in room without problem  Discharge Instructions   Future Appointments Provider Department Dept Phone   11/23/2013 12:30 PM Alycia Rossetti, [place] [phone]   11/27/2013 2:30 PM Alycia Rossetti, [place] [phone]   02/11/2014 10:00 AM Alycia Rossetti, MD [place] [phone]       Medication List    STOP taking  these medications       clopidogrel 75 MG tablet  Commonly known as:  PLAVIX      TAKE these medications       amLODipine 10 MG tablet  Commonly known as:  NORVASC  Take 10 mg by mouth  daily.     aspirin 81 MG chewable tablet  Chew 1 tablet (81 mg total) by mouth daily.     atorvastatin 80 MG tablet  Commonly known as:  LIPITOR  Take 40 mg by mouth daily.     brimonidine 0.2 % ophthalmic solution  Commonly known as:  ALPHAGAN  Place 1 drop into both eyes 3 (three) times daily.     dipyridamole-aspirin 200-25 MG per 12 hr capsule  Commonly known as:  AGGRENOX  Take one tab daily for 4 days then 1 tab twice daily.     dorzolamide-timolol 22.3-6.8 MG/ML ophthalmic solution  Commonly known as:  COSOPT  Place 1 drop into both eyes 2 (two) times daily.     insulin aspart protamine- aspart (70-30) 100 UNIT/ML injection  Commonly known as:  NOVOLOG MIX 70/30  Inject into the skin 2 (two) times daily with a meal. Inject 40 units in AM and 50 unit in PM     latanoprost 0.005 % ophthalmic solution  Commonly known as:  XALATAN  Place 1 drop into both eyes at bedtime.     losartan 100 MG tablet  Commonly known as:  COZAAR  Take 100 mg by mouth daily.     metFORMIN 1000 MG tablet  Commonly known as:  GLUCOPHAGE  Take 1,000 mg by mouth 2 (two) times daily with a meal.     metoprolol 50 MG tablet  Commonly known as:  LOPRESSOR  Take 0.5 tablets (25 mg total) by mouth 2 (two) times daily. FOR HEART/BLOOD PRESSURE. HOLD FOR SYSTOLIC BLOOD PRESSURE LESS THAN 1OO/HEART RATE LESS THAN 60     pantoprazole 40 MG tablet  Commonly known as:  PROTONIX  Take 40 mg by mouth daily.     pioglitazone 30 MG tablet  Commonly known as:  ACTOS  Take 30 mg by mouth daily.     spironolactone 25 MG tablet  Commonly known as:  ALDACTONE  Take 25 mg by mouth daily.     tamsulosin 0.4 MG Caps capsule  Commonly known as:  FLOMAX  Take 0.4 mg by mouth daily.       No Known Allergies     Follow-up Information   Follow up with Vic Blackbird, MD. (Patient has appointment scheduled within the week .Recommend close diabtetes management and BP control)    Specialty:  Family  Medicine   Contact information:   [address] [place] [address] [phone]       Follow up with [facility], KOFI, MD. Schedule an appointment as soon as possible for a visit in 2 weeks. (for evaluation of symptoms)    Specialty:  Neurology   Contact information:   [place] [id number] [phone]        The results of significant diagnostics from this hospitalization (including imaging, microbiology, ancillary and laboratory) are listed below for reference.    Significant Diagnostic Studies: Dg Chest 2 View  11/21/2013   CLINICAL DATA:  Left-sided weakness.  EXAM: CHEST  2 VIEW  COMPARISON:  Chest radiograph performed 08/22/2013  FINDINGS: The lungs are well-aerated. Pulmonary vascularity is at the upper limits of normal. There is no evidence  of focal opacification, pleural effusion or pneumothorax.  The heart is borderline normal in size. The patient is status post median sternotomy. No acute osseous abnormalities are seen.  IMPRESSION: No acute cardiopulmonary process seen.   Electronically Signed   By: Garald Balding M.D.   On: 11/21/2013 01:56   Ct Head Wo Contrast  11/21/2013   CLINICAL DATA:  Left hand and foot numbness. Left face numbness and dizziness.  EXAM: CT HEAD WITHOUT CONTRAST  TECHNIQUE: Contiguous axial images were obtained from the base of the skull through the vertex without intravenous contrast.  COMPARISON:  Earlier today at 1200 hr.  FINDINGS: 2354 hr.  Sinuses/Soft tissues: Mucosal thickening of the left maxillary sinus and bilateral ethmoid air cells. Minimal frontal sinus mucosal thickening. Clear mastoid air cells.  Intracranial: Mild low density in the periventricular white matter likely related to small vessel disease. Right thalamic and centrum semi ovale lacunar infarcts which are remote. No mass lesion, hemorrhage, hydrocephalus, acute infarct, intra-axial, or extra-axial fluid collection. Vertebral and carotid  atherosclerosis.  IMPRESSION: 1.  No acute intracranial abnormality. 2. Sinus disease. 3. Remote  lacunar infarcts with small vessel ischemic change.   Electronically Signed   By: Abigail Miyamoto M.D.   On: 11/21/2013 00:10   Ct Head Wo Contrast  11/20/2013   CLINICAL DATA:  Left-sided numbness with onset 3 hr ago. History of stroke.  EXAM: CT HEAD WITHOUT CONTRAST  TECHNIQUE: Contiguous axial images were obtained from the base of the skull through the vertex without intravenous contrast.  COMPARISON:  Brain MRI 08/22/2013  FINDINGS: Old lacunar infarcts are seen in the right centrum semiovale and bilateral thalamus. There is no evidence of acute large territory cortical infarct, mass, midline shift, intracranial hemorrhage, or extra-axial fluid collection. Age-related cerebral atrophy is present. Periventricular white-matter hypodensities are compatible with moderate chronic small vessel ischemic disease. The orbits are unremarkable. Mastoid air cells are clear. Moderate bilateral ethmoid and left maxillary sinus mucosal thickening are noted. There is mild left frontal and right maxillary sinus mucosal thickening.  IMPRESSION: No evidence of acute intracranial abnormality.   Electronically Signed   By: Logan Bores   On: 11/20/2013 13:08   Mr Jodene Nam Head Wo Contrast  11/21/2013   CLINICAL DATA:  Stroke.  New onset of left-sided numbness.  EXAM: MRI HEAD WITHOUT CONTRAST  MRA HEAD WITHOUT CONTRAST  TECHNIQUE: Multiplanar, multiecho pulse sequences of the brain and surrounding structures were obtained without intravenous contrast. Angiographic images of the head were obtained using MRA technique without contrast.  COMPARISON:  Head CT 11/20/2013 and brain MRI and MRA 08/22/2013  FINDINGS: MRI HEAD FINDINGS  There is a 4 mm focus of restricted diffusion in the right aspect of the pons. There is also a 2 mm focus of likely restricted diffusion within the left pons. There is no other evidence of acute infarct.  Periventricular white matter T2 hyperintensities are unchanged and compatible with moderate chronic small vessel ischemic disease. Remote infarcts are noted in the basal ganglia, thalami and the left cerebellum. There is moderate cerebral atrophy. There is no evidence of mass, midline shift, intracranial hemorrhage, or extra-axial fluid collection. Orbits are unremarkable. Extensive bilateral ethmoid air cell mucosal thickening is noted in addition to mild to moderate left greater than right maxillary sinus and the left frontal sinus mucosal thickening.  MRA HEAD FINDINGS  The visualized distal vertebral arteries are patent. Signal loss in the proximal intracranial vertebral arteries is again seen and may be artifactual,  although proximal left V4 stenosis is not excluded. There is very mild focal narrowing of the distal left vertebral artery immediately proximal to the vertebrobasilar junction, much less pronounced than on the prior MRA. PICA origins are not identified. Basilar artery is patent without stenosis. Right AICA is identified. SCAs are patent. PCA origins are patent. There are bilateral posterior communicating arteries, right larger than left. There is improved flow related enhancement within the P2 segments bilaterally, with only mild irregularity noted as well as a mild to moderate focal stenosis involving the left proximal P2 segment.  Internal carotid arteries are patent from skullbase to carotid termini. ACA and MCA origins are patent. ACAs are unremarkable. Proximal MCAs are unremarkable, with similar appearance of mild narrowing of distal MCA branches bilaterally. No intracranial aneurysm is identified.  IMPRESSION: 1. 4 mm acute infarct in the right pons with possible 2 mm acute infarct in the left pons. 2. Unchanged appearance of moderate chronic small vessel ischemic disease. 3. Improved flow in the distal left vertebral artery and bilateral PCAs. No evidence of major intracranial arterial  occlusion.   Electronically Signed   By: Logan Bores   On: 11/21/2013 12:30   Mr Brain Wo Contrast  11/21/2013   CLINICAL DATA:  Stroke.  New onset of left-sided numbness.  EXAM: MRI HEAD WITHOUT CONTRAST  MRA HEAD WITHOUT CONTRAST  TECHNIQUE: Multiplanar, multiecho pulse sequences of the brain and surrounding structures were obtained without intravenous contrast. Angiographic images of the head were obtained using MRA technique without contrast.  COMPARISON:  Head CT 11/20/2013 and brain MRI and MRA 08/22/2013  FINDINGS: MRI HEAD FINDINGS  There is a 4 mm focus of restricted diffusion in the right aspect of the pons. There is also a 2 mm focus of likely restricted diffusion within the left pons. There is no other evidence of acute infarct. Periventricular white matter T2 hyperintensities are unchanged and compatible with moderate chronic small vessel ischemic disease. Remote infarcts are noted in the basal ganglia, thalami and the left cerebellum. There is moderate cerebral atrophy. There is no evidence of mass, midline shift, intracranial hemorrhage, or extra-axial fluid collection. Orbits are unremarkable. Extensive bilateral ethmoid air cell mucosal thickening is noted in addition to mild to moderate left greater than right maxillary sinus and the left frontal sinus mucosal thickening.  MRA HEAD FINDINGS  The visualized distal vertebral arteries are patent. Signal loss in the proximal intracranial vertebral arteries is again seen and may be artifactual, although proximal left V4 stenosis is not excluded. There is very mild focal narrowing of the distal left vertebral artery immediately proximal to the vertebrobasilar junction, much less pronounced than on the prior MRA. PICA origins are not identified. Basilar artery is patent without stenosis. Right AICA is identified. SCAs are patent. PCA origins are patent. There are bilateral posterior communicating arteries, right larger than left. There is improved  flow related enhancement within the P2 segments bilaterally, with only mild irregularity noted as well as a mild to moderate focal stenosis involving the left proximal P2 segment.  Internal carotid arteries are patent from skullbase to carotid termini. ACA and MCA origins are patent. ACAs are unremarkable. Proximal MCAs are unremarkable, with similar appearance of mild narrowing of distal MCA branches bilaterally. No intracranial aneurysm is identified.  IMPRESSION: 1. 4 mm acute infarct in the right pons with possible 2 mm acute infarct in the left pons. 2. Unchanged appearance of moderate chronic small vessel ischemic disease. 3. Improved flow in the  distal left vertebral artery and bilateral PCAs. No evidence of major intracranial arterial occlusion.   Electronically Signed   By: Logan Bores   On: 11/21/2013 12:30    Microbiology: No results found for this or any previous visit (from the past 240 hour(s)).   Labs: Basic Metabolic Panel:  Recent Labs Lab 11/20/13 1142  NA 141  K 3.8  CL 103  CO2 27  GLUCOSE 107*  BUN 15  CREATININE 0.96  CALCIUM 9.1   Liver Function Tests:  Recent Labs Lab 11/20/13 1142  AST 17  ALT 16  ALKPHOS 53  BILITOT 0.6  PROT 7.6  ALBUMIN 3.4*   No results found for this basename: LIPASE, AMYLASE,  in the last 168 hours No results found for this basename: AMMONIA,  in the last 168 hours CBC:  Recent Labs Lab 11/20/13 1142  WBC 6.7  NEUTROABS 4.0  HGB 13.4  HCT 40.5  MCV 92.0  PLT 201   Cardiac Enzymes:  Recent Labs Lab 11/20/13 1142  TROPONINI <0.30   BNP: BNP (last 3 results) No results found for this basename: PROBNP,  in the last 8760 hours CBG:  Recent Labs Lab 11/21/13 0913 11/21/13 1141 11/21/13 1652 11/21/13 2110 11/22/13 0728  GLUCAP 84 138* 206* 176* 117*       Signed:  BLACK,KAREN M  [facility] 11/22/2013, 11:15 AM

## 2013-11-22 NOTE — Discharge Instructions (Signed)
Stroke Prevention Some health problems and behaviors may make it more likely for you to have a stroke. Below are ways to lessen your risk of having a stroke.   Be active for at least 30 minutes on most or all days.  Do not smoke. Try not to be around others who smoke.  Do not drink too much alcohol.  Do not have more than 2 drinks a day if you are a man.  Do not have more than 1 drink a day if you are a woman and are not pregnant.  Eat healthy foods, such as fruits and vegetables. If you were put on a specific diet, follow the diet as told.  Keep your cholesterol levels under control through diet and medicines. Look for foods that are low in saturated fat, trans fat, cholesterol, and are high in fiber.  If you have diabetes, follow all diet plans and take your medicine as told.  If you have high blood pressure (hypertension), follow all diet plans and take your medicine as told.  Keep a healthy weight. Eat foods that are low in calories, salt, saturated fat, trans fat, and cholesterol.  Do not take drugs.  Avoid birth control pills, if this applies. Talk to your doctor about the risks of taking birth control pills.  Talk to your doctor if you have sleep problems (sleep apnea).  Take all medicine as told by your doctor.  You may be told to take aspirin or blood thinner medicine. Take this medicine as told by your doctor.  Understand your medicine instructions.  Make sure any other conditions you have are being taken care of. GET HELP RIGHT AWAY IF:  You suddenly lose feeling (you feel numb) or have weakness in your face, arm, or leg.  Your face or eyelid hangs down to one side.  You suddenly feel confused.  You have trouble talking (aphasia) or understanding what people are saying.  You suddenly have trouble seeing in one or both eyes.  You suddenly have trouble walking.  You are dizzy.  You lose your balance or your movements are clumsy (uncoordinated).  You  suddenly have a very bad headache and you do not know the cause.  You have new chest pain.  Your heart feels like it is fluttering or skipping a beat (irregular heartbeat). Do not wait to see if the symptoms above go away. Get help right away. Call your local emergency services (911 in [place] Do not drive yourself to the hospital. Document Released: 04/18/2012 Document Revised: 08/08/2013 Document Reviewed: 04/20/2013 [facility] Patient Information 2014 [employer].

## 2013-11-22 NOTE — Discharge Summary (Signed)
Patient seen and examined at bedside, agree with Ms [name] 's Note .  Follow up with Dr Merlene Laughter in 2 weeks.  Continue with aggrenox as per neurologist recommendations.   [name] MD

## 2013-11-22 NOTE — Progress Notes (Signed)
*  PRELIMINARY RESULTS* Echocardiogram 2D Echocardiogram has been performed.  [place], [place] 11/22/2013, 11:10 AM

## 2013-11-23 ENCOUNTER — Ambulatory Visit (INDEPENDENT_AMBULATORY_CARE_PROVIDER_SITE_OTHER): Payer: Medicare Other | Admitting: Family Medicine

## 2013-11-23 ENCOUNTER — Encounter: Payer: Self-pay | Admitting: Family Medicine

## 2013-11-23 VITALS — BP 136/70 | HR 68 | Resp 18 | Ht 69.0 in | Wt 286.0 lb

## 2013-11-23 DIAGNOSIS — I639 Cerebral infarction, unspecified: Secondary | ICD-10-CM

## 2013-11-23 DIAGNOSIS — IMO0001 Reserved for inherently not codable concepts without codable children: Secondary | ICD-10-CM

## 2013-11-23 DIAGNOSIS — I10 Essential (primary) hypertension: Secondary | ICD-10-CM

## 2013-11-23 DIAGNOSIS — E1165 Type 2 diabetes mellitus with hyperglycemia: Secondary | ICD-10-CM

## 2013-11-23 DIAGNOSIS — IMO0002 Reserved for concepts with insufficient information to code with codable children: Secondary | ICD-10-CM

## 2013-11-23 DIAGNOSIS — I6350 Cerebral infarction due to unspecified occlusion or stenosis of unspecified cerebral artery: Secondary | ICD-10-CM

## 2013-11-23 NOTE — Patient Instructions (Signed)
Increase morning dose of insulin to 45 units Exercise three week Change your diet, more fruits and veggies  Change the follow-up appt to 6 WEEKS

## 2013-11-25 NOTE — Assessment & Plan Note (Signed)
[name] to get A1c less than 7% I will increase his 7030-45 units in the morning and continue 50 units at night He will continue his Actos and his metformin

## 2013-11-25 NOTE — Assessment & Plan Note (Signed)
Blood pressure looks very good today we'll continue current medications

## 2013-11-25 NOTE — Assessment & Plan Note (Signed)
Patient is status post another CVA event. He was seen by neurology and there was some talk of a TEE however this was not done. I will discuss with neurology if this was needed and help set this up with cardiology if needed. We need to continue to work on his risk factors and we did discuss his sedentary lifestyle.

## 2013-11-25 NOTE — Progress Notes (Signed)
   Subjective:    Patient ID: [name], male    DOB: 1941/04/16, 73 y.o.   MRN: [id number]  HPI Patient here to follow possible [place]. He was admitted secondary to recurrent stroke. He had "liquid stroke"is actually seen in the ER with numbness and tingling in his face and he was sent home he then returned the next day was admitted MRI showed the new evolving stroke. He was seen by neurology he was taken off of Plavix and started on Aggrenox. There plans to get [facility] at [facility] appointment on Wednesday until then he is on aspirin 81 mg appear His lipid panel was unremarkable his A1c was minimally elevated at 7.1. He's been taking all of his medications as prescribed. Per family there was some discussion about the TEE as this is his third stroke in a row however this was not done before his discharge and they're concerned about this. He does not have any particularly new focal deficits he does have a little bit more lacking of the left eyelid and previous and his wife states that his balance is off the  Review of Systems  GEN- denies fatigue, fever, weight loss,weakness, recent illness HEENT- denies eye drainage, change in vision, nasal discharge, CVS- denies chest pain, palpitations RESP- denies SOB, cough, wheeze ABD- denies N/V, change in stools, abd pain GU- denies dysuria, hematuria, dribbling, incontinence MSK- denies joint pain, muscle aches, injury Neuro- denies headache, dizziness, syncope, seizure activity      Objective:   Physical Exam GEN- NAD, alert and oriented x3 HEENT- PERRL, EOMI, non injected sclera, pink conjunctiva, MMM, oropharynx clear, left upper eyelid droop Neck- Supple,  CVS- RRR, no murmur RESP-CTAB EXT- No edema Pulses- Radial 2+, NEURO-CNII-XII in tact, weakness LUE and LLE compared to Right, walks with cane         Assessment & Plan:

## 2013-11-27 ENCOUNTER — Ambulatory Visit (HOSPITAL_COMMUNITY)
Admission: RE | Admit: 2013-11-27 | Discharge: 2013-11-27 | Disposition: A | Discharge status: Home / Self Care | Payer: Medicare Other | Source: Ambulatory Visit | Attending: Family Medicine | Admitting: Family Medicine

## 2013-11-27 ENCOUNTER — Telehealth: Payer: Self-pay | Admitting: Family Medicine

## 2013-11-27 ENCOUNTER — Inpatient Hospital Stay: Payer: Medicare Other | Admitting: Family Medicine

## 2013-11-27 DIAGNOSIS — R262 Difficulty in walking, not elsewhere classified: Secondary | ICD-10-CM | POA: Insufficient documentation

## 2013-11-27 DIAGNOSIS — I69998 Other sequelae following unspecified cerebrovascular disease: Secondary | ICD-10-CM | POA: Insufficient documentation

## 2013-11-27 DIAGNOSIS — E119 Type 2 diabetes mellitus without complications: Secondary | ICD-10-CM | POA: Insufficient documentation

## 2013-11-27 DIAGNOSIS — M6281 Muscle weakness (generalized): Secondary | ICD-10-CM | POA: Insufficient documentation

## 2013-11-27 DIAGNOSIS — R2689 Other abnormalities of gait and mobility: Secondary | ICD-10-CM

## 2013-11-27 DIAGNOSIS — I69398 Other sequelae of cerebral infarction: Secondary | ICD-10-CM

## 2013-11-27 DIAGNOSIS — E785 Hyperlipidemia, unspecified: Secondary | ICD-10-CM | POA: Insufficient documentation

## 2013-11-27 DIAGNOSIS — IMO0001 Reserved for inherently not codable concepts without codable children: Secondary | ICD-10-CM | POA: Insufficient documentation

## 2013-11-27 DIAGNOSIS — I10 Essential (primary) hypertension: Secondary | ICD-10-CM | POA: Insufficient documentation

## 2013-11-27 NOTE — Telephone Encounter (Signed)
I spoke with Neurology TEE was not needed, he had small vessel stroke, did not look like embolic, work on risk factors Pt will let me know if he cant get aggrenox from the [place]- discussed above with pt

## 2013-11-27 NOTE — Evaluation (Addendum)
Physical Therapy Evaluation  Patient Details  Name: [name] MRN: [id number] Date of Birth: [birth year]  Today's Date: 11/27/2013 Time: [id number] PT Time Calculation (min): 17 min Charge:  Evaluation [id number]; there ex [phone]             Visit#: 1 of 8  Re-eval: 12/27/13 Assessment Diagnosis: CVA RT Surgical Date: 01/04/14 Prior Therapy: none  Authorization: [employer]    Past Medical History:  Past Medical History  Diagnosis Date  . Hypertension   . Coronary artery disease   . Diabetes mellitus   . C. difficile diarrhea   . Peripheral vascular disease   . 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 Glaucoma   . GERD (gastroesophageal reflux disease)   . Stroke 2008  . Sleep apnea   . 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 Cholecystectomy    . Coronary artery bypass graft    . Thyroid surgery    . No past surgeries      Subjective Symptoms/Limitations Symptoms: Mr. [name] states that he had a new stroke on 11/20/2013 he was admitted into the hospital and discharged on 11/02/2013. 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  Mr. [name] states that currently has noticed that his balance is slightly off.  The patient is currently using a cane and was not using a cane before; he is now being referred for OP PT>  How long can you walk comfortably?: Pt has not really walked any great distance. The greatest he has walked has been about 10 minutes  Pain Assessment Currently in Pain?: No/denies    Balance Screening Balance Screen Has the patient fallen in the past 6 months: No  Sensation/Coordination/Flexibility/Functional Tests Functional Tests Functional Tests: FS 47  Assessment RLE Strength Right Hip Flexion: 5/5 Right Hip Extension: 5/5 Right Hip ABduction: 5/5 Right  Hip ADduction: 5/5 Right Knee Flexion: 5/5 Right Knee Extension: 5/5 Right Ankle Dorsiflexion: 5/5 LLE Strength Left Hip Flexion: 3+/5 Left Hip Extension: 3-/5 Left Hip ABduction: 3-/5 Left Knee Flexion:  (4-/5) Left Knee Extension: 5/5 Left Ankle Dorsiflexion: 3+/5 Left Ankle Eversion: 3+/5  Berg Balance Test Sit to Stand: Able to stand without using hands and stabilize independently Standing Unsupported: Able to stand safely 2 minutes Sitting with Back Unsupported but Feet Supported on Floor or Stool: Able to sit safely and securely 2 minutes Stand to Sit: Controls descent by using hands Transfers: Able to transfer safely, minor use of hands Standing Unsupported with Eyes Closed: Able to stand 10 seconds safely Standing Ubsupported with Feet Together: Able to place feet together independently and stand for 1 minute with supervision From Standing, Reach Forward with Outstretched Arm: Can reach forward >12 cm safely (5") From Standing Position, Pick up Object from Floor: Able to pick up shoe, needs supervision From Standing Position, Turn to Look Behind Over each Shoulder: Looks behind one side only/other side shows less weight shift Turn 360 Degrees: Able to turn 360 degrees safely but slowly Standing Unsupported, Alternately Place Feet on Step/Stool: Able to stand independently and complete 8 steps >20 seconds Standing Unsupported, One Foot in Front: Able to plae foot ahead of the other independently and hold 30 seconds Standing on One Leg: Tries to lift leg/unable to hold 3 seconds but remains standing independently  Total Score: 44     Exercise: Seated Other Seated Knee Exercises: toe raise x 10; T-band for Lt eversion. Supine Straight Leg Raises: 5 reps Sidelying Hip ABduction: 5 reps Prone  Hamstring Curl: 5 reps (with t-band) Hip Extension: 5 reps      Physical Therapy Assessment and Plan PT Assessment and Plan Clinical Impression Statement: Pt is a 73 yo with  recent hx of CVA.  Pt has been referred to therapy for evaluation and treatment.  Exam demonstrates decreased balance and decreased Lt LE strength.  PT will benefit from skilled Pt to retrun pt to previous functional  level.  Pt will benefit from skilled therapeutic intervention in order to improve on the following deficits: Decreased activity tolerance;Decreased balance;Decreased strength Rehab Potential: Good PT Frequency: Min 2X/week PT Duration: 4 weeks PT Treatment/Interventions: Functional mobility training;Therapeutic activities;Therapeutic exercise;Neuromuscular re-education;Balance training PT Plan: Pt given HEP for strengthening we will concentrate on balance activities while we are working with Mr. [name].     Goals PT Short Term Goals Time to Complete Short Term Goals: 2 weeks PT Short Term Goal 1: Pt strength to be increased  by 1/2 grade so that he is able to rise from a chair without any difficulty. PT Short Term Goal 2: Pt berg to be improved 5 points to reduce risk of falling PT Long Term Goals Time to Complete Long Term Goals: 4 weeks PT Long Term Goal 1: I in advance HEP PT Long Term Goal 2:  Pt strength to be increased one grade to climb one flight of stair without dificulty. Long Term Goal 3: berg to improve 10 pts to allow pt to walk safely without an assistive device.  Problem List Patient Active Problem List   Diagnosis Date Noted  . 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 Stroke 11/21/2013  . Chronic kidney disease 11/21/2013  . Left leg weakness 09/06/2013  . CVA (cerebral infarction) 09/01/2013  . Left-sided weakness 08/22/2013  . Tinea pedis 04/12/2013  . OSA (obstructive sleep apnea) 04/12/2013  . Diabetes mellitus type II, uncontrolled 11/28/2012  . PVD (peripheral vascular disease) 11/28/2012  . CAD (coronary artery disease) 11/28/2012  . Hyperlipidemia 11/28/2012  . Obesity 11/28/2012  . History of stroke 11/28/2012   . OAB (overactive bladder) 11/28/2012  . HTN (hypertension) 05/20/2011    PT - End of Session Equipment Utilized During Treatment: Gait belt Activity Tolerance: Patient tolerated treatment well PT Plan of Care PT Home Exercise Plan: given  GP Functional Assessment Tool Used: fotot Functional Limitation: Mobility: Walking and moving around Mobility: Walking and Moving Around Current Status [id number]): At least 40 percent but less than 60 percent impaired, limited or restricted Mobility: Walking and Moving Around Goal Status [phone]): At least 20 percent but less than 40 percent impaired, limited or restricted  Daijha Leggio,CINDY 11/27/2013, 4:35 PM  Physician Documentation Your signature is required to indicate approval of the treatment plan as stated above.  Please sign and either send electronically or make a copy of this report for your files and return this physician signed original.   Please mark one 1.__approve of plan  2. ___approve of plan with the following conditions.   ______________________________  _____________________ Physician Signature                                                                                                             Date

## 2013-11-28 NOTE — Evaluation (Signed)
Agree with above 

## 2013-12-04 ENCOUNTER — Ambulatory Visit (HOSPITAL_COMMUNITY)
Admission: RE | Admit: 2013-12-04 | Discharge: 2013-12-04 | Disposition: A | Discharge status: Home / Self Care | Payer: Medicare Other | Source: Ambulatory Visit | Attending: Family Medicine | Admitting: Family Medicine

## 2013-12-04 DIAGNOSIS — R2689 Other abnormalities of gait and mobility: Secondary | ICD-10-CM

## 2013-12-04 DIAGNOSIS — I69398 Other sequelae of cerebral infarction: Secondary | ICD-10-CM

## 2013-12-04 DIAGNOSIS — IMO0001 Reserved for inherently not codable concepts without codable children: Secondary | ICD-10-CM | POA: Insufficient documentation

## 2013-12-04 DIAGNOSIS — R269 Unspecified abnormalities of gait and mobility: Secondary | ICD-10-CM | POA: Insufficient documentation

## 2013-12-04 DIAGNOSIS — M6281 Muscle weakness (generalized): Secondary | ICD-10-CM | POA: Insufficient documentation

## 2013-12-04 DIAGNOSIS — E119 Type 2 diabetes mellitus without complications: Secondary | ICD-10-CM | POA: Insufficient documentation

## 2013-12-04 DIAGNOSIS — I10 Essential (primary) hypertension: Secondary | ICD-10-CM | POA: Insufficient documentation

## 2013-12-04 DIAGNOSIS — I69998 Other sequelae following unspecified cerebrovascular disease: Secondary | ICD-10-CM | POA: Insufficient documentation

## 2013-12-04 NOTE — Progress Notes (Signed)
Physical Therapy Treatment Patient Details  Name: [name] MRN: [id number] Date of Birth: [birth year]  Today's Date: 12/04/2013 Time: 1305-1348 PT Time Calculation (min): 29 min Charge neuroreed [id number]; there ex [id number] Visit#: 2 of 8  Re-eval: 12/27/13    Authorization: [employer]  Subjective: Symptoms/Limitations Symptoms: Pt states he has been doing his home exercise program. Pain Assessment Currently in Pain?: No/denies     Exercise/Treatments   Aerobic Stationary Bike: nustep L 4 x 8:00 Standing Heel Raises: 10 reps Knee Flexion: Left;10 reps;Limitations (hip abduction/extension as well) Knee Flexion Limitations: 5# Lateral Step Up: Left;10 reps;Hand Hold: 0;Step Height: 4" Forward Step Up: Left;10 reps;Step Height: 4" Functional Squat: 10 reps SLS with Vectors: 5" x 3 B Other Standing Knee Exercises: tandem gt/retro gt/ x 2 RT Other Standing Knee Exercises: tandem stance B x 3 ; marching x 10 slow and hold    Physical Therapy Assessment and Plan PT Assessment and Plan Clinical Impression Statement: Pt completed new exercises with therapist assist to keep pt balanced.  Overall balance improved. PT Plan: continue to focus on balance ie turning head with tandem gt activities; 1-15; cone rotation on foam    Goals  progressing  Problem List Patient Active Problem List   Diagnosis Date Noted  . 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 Stroke 11/21/2013  . Chronic kidney disease 11/21/2013  . Left leg weakness 09/06/2013  . CVA (cerebral infarction) 09/01/2013  . Left-sided weakness 08/22/2013  . Tinea pedis 04/12/2013  . OSA (obstructive sleep apnea) 04/12/2013  . Diabetes mellitus type II, uncontrolled 11/28/2012  . PVD (peripheral vascular disease) 11/28/2012  . CAD (coronary artery disease) 11/28/2012  . Hyperlipidemia 11/28/2012  . Obesity 11/28/2012  . History of stroke 11/28/2012  . OAB (overactive bladder)  11/28/2012  . HTN (hypertension) 05/20/2011    PT - End of Session Equipment Utilized During Treatment: Gait belt Activity Tolerance: Patient tolerated treatment well General Behavior During Therapy: [facility] for tasks assessed/performed  GP    RUSSELL,CINDY 12/04/2013, 1:48 PM

## 2013-12-06 ENCOUNTER — Ambulatory Visit (HOSPITAL_COMMUNITY)
Admission: RE | Admit: 2013-12-06 | Discharge: 2013-12-06 | Disposition: A | Discharge status: Home / Self Care | Payer: Medicare Other | Source: Ambulatory Visit | Attending: Family Medicine | Admitting: Family Medicine

## 2013-12-06 DIAGNOSIS — R2689 Other abnormalities of gait and mobility: Principal | ICD-10-CM

## 2013-12-06 DIAGNOSIS — I69398 Other sequelae of cerebral infarction: Secondary | ICD-10-CM

## 2013-12-06 NOTE — Progress Notes (Signed)
Physical Therapy Treatment Patient Details  Name: [name] MRN: [id number] Date of Birth: September 21, 1941  Today's Date: 12/06/2013 Time: 1300-1356 PT Time Calculation (min): 90 min Charge TE 1300-1323, NMR [id number]  Visit#: 3 of 8  Re-eval: 12/27/13 Assessment Diagnosis: CVA RT Surgical Date: 01/04/14 Next MD Visit: Buelah Manis  Prior Therapy: none  Authorization: [employer]  Authorization Time Period:    Authorization Visit#:   of     Subjective: Symptoms/Limitations Symptoms: Pain free, feeling good today.  Compliant with HEP.   Pain Assessment Currently in Pain?: No/denies  Objective:   Exercise/Treatments Aerobic Stationary Bike: nustep L 4 x 10:00 Standing Heel Raises: 10 reps;Limitations Heel Raises Limitations: Toe raises 10x Lateral Step Up: Left;15 reps;Hand Hold: 0;Step Height: 4" Forward Step Up: Left;15 reps;Hand Hold: 0;Step Height: 4" Functional Squat: 10 reps Rocker Board: 2 minutes;Limitations Rocker Board Limitations: R/L with intermittent HHA SLS: 2x 30" with intermittent HHA,, LT 3" Rt 4" max of 5 Other Standing Knee Exercises: tandem gt/retro gt/ x 2 RTw/ head turns   Balance Exercises Standing Tandem Stance: Eyes open;Intermittent HHA;2 reps;30 secs;Limitations Tandem Stance Limitations: 6in step SLS: Eyes open;2 reps;30 secs;Hand held assist (HHA) 1 Tandem Gait: 2 reps;Limitations Tandem Gait Limitations: with head turns Retro Gait: 2 reps;Limitations Retro Gait Limitations: with head turns Numbers 1-15: 2 reps;Balance Beam Cone Rotation: Foam;R/L   Physical Therapy Assessment and Plan PT Assessment and Plan Clinical Impression Statement: Progressed to high level balance activities on dynamic surface and with head movements with tandem gait activities.  Pt able to demonstate appropriate technique with therex activities for LE strengthening following demonstration. PT Plan: Continue with high level balance activities and functioanl  strenghtening for Lt LE.      Goals    Problem List Patient Active Problem List   Diagnosis Date Noted  . 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 Stroke 11/21/2013  . Chronic kidney disease 11/21/2013  . Left leg weakness 09/06/2013  . CVA (cerebral infarction) 09/01/2013  . Left-sided weakness 08/22/2013  . Tinea pedis 04/12/2013  . OSA (obstructive sleep apnea) 04/12/2013  . Diabetes mellitus type II, uncontrolled 11/28/2012  . PVD (peripheral vascular disease) 11/28/2012  . CAD (coronary artery disease) 11/28/2012  . Hyperlipidemia 11/28/2012  . Obesity 11/28/2012  . History of stroke 11/28/2012  . OAB (overactive bladder) 11/28/2012  . HTN (hypertension) 05/20/2011    PT - End of Session Equipment Utilized During Treatment: Gait belt Activity Tolerance: Patient tolerated treatment well General Behavior During Therapy: [facility] for tasks assessed/performed  GP    Aldona Lento 12/06/2013, 4:44 PM

## 2013-12-11 ENCOUNTER — Telehealth (HOSPITAL_COMMUNITY): Payer: Self-pay

## 2013-12-11 ENCOUNTER — Ambulatory Visit (HOSPITAL_COMMUNITY): Payer: Medicare Other | Admitting: Physical Therapy

## 2013-12-13 ENCOUNTER — Ambulatory Visit (HOSPITAL_COMMUNITY): Payer: Medicare Other | Admitting: *Deleted

## 2013-12-18 ENCOUNTER — Ambulatory Visit (HOSPITAL_COMMUNITY): Payer: Medicare Other | Admitting: Physical Therapy

## 2013-12-20 ENCOUNTER — Ambulatory Visit (HOSPITAL_COMMUNITY): Payer: Medicare Other | Admitting: Physical Therapy

## 2013-12-25 ENCOUNTER — Ambulatory Visit (HOSPITAL_COMMUNITY): Payer: Medicare Other | Admitting: Physical Therapy

## 2013-12-27 ENCOUNTER — Ambulatory Visit (HOSPITAL_COMMUNITY): Payer: Medicare Other | Admitting: Physical Therapy

## 2014-01-04 ENCOUNTER — Encounter: Payer: Self-pay | Admitting: Family Medicine

## 2014-01-04 ENCOUNTER — Ambulatory Visit (INDEPENDENT_AMBULATORY_CARE_PROVIDER_SITE_OTHER): Payer: Medicare Other | Admitting: Family Medicine

## 2014-01-04 VITALS — BP 142/84 | HR 62 | Temp 98.0°F | Resp 18 | Ht 68.0 in | Wt 283.0 lb

## 2014-01-04 DIAGNOSIS — IMO0001 Reserved for inherently not codable concepts without codable children: Secondary | ICD-10-CM

## 2014-01-04 DIAGNOSIS — E1165 Type 2 diabetes mellitus with hyperglycemia: Secondary | ICD-10-CM

## 2014-01-04 DIAGNOSIS — IMO0002 Reserved for concepts with insufficient information to code with codable children: Secondary | ICD-10-CM

## 2014-01-04 DIAGNOSIS — I639 Cerebral infarction, unspecified: Secondary | ICD-10-CM

## 2014-01-04 DIAGNOSIS — N189 Chronic kidney disease, unspecified: Secondary | ICD-10-CM

## 2014-01-04 DIAGNOSIS — I6350 Cerebral infarction due to unspecified occlusion or stenosis of unspecified cerebral artery: Secondary | ICD-10-CM

## 2014-01-04 NOTE — Patient Instructions (Signed)
Continue current medications F/U 3 months for medications

## 2014-01-06 ENCOUNTER — Encounter: Payer: Self-pay | Admitting: Family Medicine

## 2014-01-06 NOTE — Assessment & Plan Note (Signed)
Last A1C 7.1% but he is having hypoglycemia with the 45 units in AM, decrease to 43 units in AM, continue 50 units in PM

## 2014-01-06 NOTE — Assessment & Plan Note (Signed)
[facility] Continue risk factor management

## 2014-01-06 NOTE — Assessment & Plan Note (Signed)
BP looks okay today, no change to meds

## 2014-01-06 NOTE — Progress Notes (Signed)
    Subjective:    Patient ID: [name], male    DOB: 1941-09-08, 73 y.o.   MRN: [id number]  Patient presents for 6 week F/U   patient here for interim followup. He recently had another cerebrovascular accident resulting in some left-sided weakness. He is no longer in physical therapy. He still has numbness but feels that some of history and has come back. He is also followed by the [facility], even though neurology here in [place] did not fill TEE was warranted the [facility] have set him up with cardiology to have the procedure done due to his back the back strokes.  Diabetes mellitus he does not have his glucometer with him today and states that his blood sugar has been dropping some around noontime when he takes 45 units in the morning of the 7030 he is taking 50 units at bedtime and does not have any problems overnight or in the morning. His blood pressure has been running very well at home he states his morning was 120/60 in his right arm and [facility]  Also noted that they're sending him for another colonoscopy just for routine No concerns today       Review Of Systems:  GEN- denies fatigue, fever, weight loss,weakness, recent illness HEENT- denies eye drainage, change in vision, nasal discharge, CVS- denies chest pain, palpitations RESP- denies SOB, cough, wheeze ABD- denies N/V, change in stools, abd pain MSK- denies joint pain, muscle aches, injury Neuro- denies headache, dizziness, syncope, seizure activity       Objective:    BP 142/84  Pulse 62  Temp(Src) 98 F (36.7 C)  Resp 18  Ht 5\' 8"  (1.727 m)  Wt 283 lb (128.368 kg)  BMI 43.04 kg/m2 GEN- NAD, alert and oriented x3 HEENT- PERRL, EOMI, non injected sclera, pink conjunctiva, MMM, oropharynx clear, left upper eyelid droop Neck- Supple,  CVS- RRR, no murmur RESP-CTAB EXT- pedal edema L >R Pulses- Radial  2+       Assessment & Plan:      Problem List Items Addressed This Visit   None      Note: This dictation was prepared with Dragon dictation along with smaller phrase technology. Any transcriptional errors that result from this process are unintentional.

## 2014-02-11 ENCOUNTER — Ambulatory Visit: Payer: Medicare Other | Admitting: Family Medicine

## 2014-03-19 ENCOUNTER — Telehealth: Payer: Self-pay | Admitting: Family Medicine

## 2014-03-19 MED ORDER — AZITHROMYCIN 250 MG PO TABS: ORAL_TABLET | ORAL | Status: DC

## 2014-03-19 NOTE — Telephone Encounter (Signed)
[name] was in the office today for visit. She states her husband has had productive cough for the past 4 days. He is a complex medical history. He is due to have a procedure done at the [facility] this Friday and they're concerned because he has a productive cough which has a yellow sputum. He's not had any fever or shortness of breath. I will go ahead and start him on azithromycin secondary to his history he she will also give him Robitussin-DM

## 2014-03-26 ENCOUNTER — Emergency Department (HOSPITAL_COMMUNITY): Payer: Medicare Other

## 2014-03-26 ENCOUNTER — Encounter (HOSPITAL_COMMUNITY): Payer: Self-pay | Admitting: Emergency Medicine

## 2014-03-26 ENCOUNTER — Ambulatory Visit: Payer: Medicare Other | Admitting: Family Medicine

## 2014-03-26 ENCOUNTER — Emergency Department (HOSPITAL_COMMUNITY)
Admission: EM | Admit: 2014-03-26 | Discharge: 2014-03-26 | Disposition: A | Discharge status: Home / Self Care | Payer: Medicare Other | Attending: Emergency Medicine | Admitting: Emergency Medicine

## 2014-03-26 DIAGNOSIS — K219 Gastro-esophageal reflux disease without esophagitis: Secondary | ICD-10-CM | POA: Insufficient documentation

## 2014-03-26 DIAGNOSIS — R1013 Epigastric pain: Secondary | ICD-10-CM | POA: Insufficient documentation

## 2014-03-26 DIAGNOSIS — J209 Acute bronchitis, unspecified: Secondary | ICD-10-CM | POA: Insufficient documentation

## 2014-03-26 DIAGNOSIS — Z79899 Other long term (current) drug therapy: Secondary | ICD-10-CM | POA: Insufficient documentation

## 2014-03-26 DIAGNOSIS — Z87891 Personal history of nicotine dependence: Secondary | ICD-10-CM | POA: Insufficient documentation

## 2014-03-26 DIAGNOSIS — Z951 Presence of aortocoronary bypass graft: Secondary | ICD-10-CM | POA: Insufficient documentation

## 2014-03-26 DIAGNOSIS — Z792 Long term (current) use of antibiotics: Secondary | ICD-10-CM | POA: Insufficient documentation

## 2014-03-26 DIAGNOSIS — I129 Hypertensive chronic kidney disease with stage 1 through stage 4 chronic kidney disease, or unspecified chronic kidney disease: Secondary | ICD-10-CM | POA: Insufficient documentation

## 2014-03-26 DIAGNOSIS — J40 Bronchitis, not specified as acute or chronic: Secondary | ICD-10-CM

## 2014-03-26 DIAGNOSIS — Z794 Long term (current) use of insulin: Secondary | ICD-10-CM | POA: Insufficient documentation

## 2014-03-26 DIAGNOSIS — R748 Abnormal levels of other serum enzymes: Secondary | ICD-10-CM | POA: Insufficient documentation

## 2014-03-26 DIAGNOSIS — H409 Unspecified glaucoma: Secondary | ICD-10-CM | POA: Insufficient documentation

## 2014-03-26 DIAGNOSIS — N181 Chronic kidney disease, stage 1: Secondary | ICD-10-CM | POA: Insufficient documentation

## 2014-03-26 DIAGNOSIS — E1129 Type 2 diabetes mellitus with other diabetic kidney complication: Secondary | ICD-10-CM | POA: Insufficient documentation

## 2014-03-26 DIAGNOSIS — Z8673 Personal history of transient ischemic attack (TIA), and cerebral infarction without residual deficits: Secondary | ICD-10-CM | POA: Insufficient documentation

## 2014-03-26 DIAGNOSIS — I2510 Atherosclerotic heart disease of native coronary artery without angina pectoris: Secondary | ICD-10-CM | POA: Insufficient documentation

## 2014-03-26 LAB — HEPATIC FUNCTION PANEL
ALT: 17 U/L (ref 0–53)
AST: 15 U/L (ref 0–37)
Albumin: 3.1 g/dL — ABNORMAL LOW (ref 3.5–5.2)
Alkaline Phosphatase: 54 U/L (ref 39–117)
Bilirubin, Direct: <0.2 mg/dL (ref 0.0–0.3)
Total Bilirubin: 0.3 mg/dL (ref 0.3–1.2)
Total Protein: 6.6 g/dL (ref 6.0–8.3)

## 2014-03-26 LAB — CBC
HCT: 35.8 % — ABNORMAL LOW (ref 39.0–52.0)
Hemoglobin: 11.6 g/dL — ABNORMAL LOW (ref 13.0–17.0)
MCH: 30 pg (ref 26.0–34.0)
MCHC: 32.4 g/dL (ref 30.0–36.0)
MCV: 92.5 fL (ref 78.0–100.0)
Platelets: 228 10*3/uL (ref 150–400)
RBC: 3.87 MIL/uL — ABNORMAL LOW (ref 4.22–5.81)
RDW: 13.7 % (ref 11.5–15.5)
WBC: 7.1 10*3/uL (ref 4.0–10.5)

## 2014-03-26 LAB — BASIC METABOLIC PANEL
BUN: 14 mg/dL (ref 6–23)
CO2: 29 mEq/L (ref 19–32)
Calcium: 8.6 mg/dL (ref 8.4–10.5)
Chloride: 103 mEq/L (ref 96–112)
Creatinine, Ser: 1.02 mg/dL (ref 0.50–1.35)
GFR calc Af Amer: 83 mL/min — ABNORMAL LOW (ref >90)
GFR calc non Af Amer: 72 mL/min — ABNORMAL LOW (ref >90)
Glucose, Bld: 109 mg/dL — ABNORMAL HIGH (ref 70–99)
Potassium: 4 mEq/L (ref 3.7–5.3)
Sodium: 143 mEq/L (ref 137–147)

## 2014-03-26 LAB — LIPASE, BLOOD: Lipase: 85 U/L — ABNORMAL HIGH (ref 11–59)

## 2014-03-26 MED ORDER — LEVOFLOXACIN 500 MG PO TABS: 500.0000 mg | ORAL_TABLET | Freq: Every day | ORAL | Status: DC

## 2014-03-26 MED ORDER — TRAMADOL HCL 50 MG PO TABS: 50.0000 mg | ORAL_TABLET | Freq: Four times a day (QID) | ORAL | Status: DC | PRN

## 2014-03-26 NOTE — ED Notes (Signed)
Cough times one week.  C/o pain to right side.

## 2014-03-26 NOTE — ED Notes (Signed)
Pt alert & oriented x4, stable gait. Patient given discharge instructions, paperwork & prescription(s). Patient  instructed to stop at the registration desk to finish any additional paperwork. Patient verbalized understanding. Pt left department w/ no further questions. 

## 2014-03-26 NOTE — Discharge Instructions (Signed)
Follow up with your md for recheck this week °

## 2014-03-26 NOTE — ED Provider Notes (Signed)
CSN: [id number]     Arrival date & time 03/26/14  1048 History   First MD Initiated Contact with Patient 03/26/14 1242     Chief Complaint  Patient presents with  . Cough     (Consider location/radiation/quality/duration/timing/severity/associated sxs/prior Treatment) Patient is a 73 y.o. male presenting with cough. The history is provided by the patient (the pt complains of a cough and sore abd).  Cough Cough characteristics:  Non-productive Severity:  Moderate Onset quality:  Gradual Timing:  Constant Progression:  Unchanged Chronicity:  New Smoker: no   Context: not animal exposure   Associated symptoms: no chest pain, no eye discharge, no headaches and no rash     Past Medical History  Diagnosis Date  . Hypertension   . Coronary artery disease   . Diabetes mellitus   . C. difficile diarrhea   . Peripheral vascular disease   . 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 Glaucoma   . GERD (gastroesophageal reflux disease)   . Sleep apnea   . 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 Stroke 2008, 2014, 2015   Past Surgical History  Procedure Laterality Date  . Cholecystectomy    . Coronary artery bypass graft    . Thyroid surgery    . No past surgeries     Family History  Problem Relation Age of Onset  . Diabetes Mother   . Heart failure Mother   . Hypertension Mother   . Diabetes Father   . Heart failure Father   . Hypertension Father   . Diabetes Sister   . Heart failure Sister   . Hypertension Sister   . Hyperlipidemia Sister   . Diabetes Brother   . Heart failure Brother   . Hypertension Brother    History  Substance Use Topics  . Smoking status: Former Research scientist (life sciences)  . Smokeless tobacco: Never Used  . Alcohol Use: No    Review of Systems  Constitutional: Negative for appetite change and fatigue.  HENT: Negative for congestion, ear discharge and sinus pressure.   Eyes: Negative for discharge.   Respiratory: Positive for cough.   Cardiovascular: Negative for chest pain.  Gastrointestinal: Positive for abdominal pain. Negative for diarrhea.  Genitourinary: Negative for frequency and hematuria.  Musculoskeletal: Negative for back pain.  Skin: Negative for rash.  Neurological: Negative for seizures and headaches.  Psychiatric/Behavioral: Negative for hallucinations.      Allergies  Review of patient's allergies indicates no known allergies.  Home Medications   Prior to Admission medications   Medication Sig Start Date End Date Taking? Authorizing Provider  amLODipine (NORVASC) 10 MG tablet Take 10 mg by mouth daily.   Yes Historical Provider, MD  atorvastatin (LIPITOR) 80 MG tablet Take 40 mg by mouth daily.    Yes Historical Provider, MD  brimonidine (ALPHAGAN) 0.2 % ophthalmic solution Place 1 drop into both eyes 3 (three) times daily.   Yes Historical Provider, MD  dipyridamole-aspirin (AGGRENOX) 200-25 MG per 12 hr capsule Take 1 capsule by mouth 2 (two) times daily.   Yes Historical Provider, MD  dorzolamide-timolol (COSOPT) 22.3-6.8 MG/ML ophthalmic solution Place 1 drop into both eyes 2 (two) times daily.   Yes Historical Provider, MD  guaiFENesin-dextromethorphan (ROBITUSSIN DM) 100-10 MG/5ML syrup Take 5 mLs by mouth every 4 (four) hours as needed for cough.   Yes Historical Provider, MD  insulin aspart protamine-insulin aspart (NOVOLOG 70/30) (70-30) 100 UNIT/ML injection Inject 40-50  Units into the skin 2 (two) times daily with a meal. Inject 40 units in AM and 50 unit in PM   Yes Historical Provider, MD  latanoprost (XALATAN) 0.005 % ophthalmic solution Place 1 drop into both eyes at bedtime.   Yes Historical Provider, MD  losartan (COZAAR) 100 MG tablet Take 100 mg by mouth daily.     Yes Historical Provider, MD  metFORMIN (GLUCOPHAGE) 1000 MG tablet Take 1,000 mg by mouth 2 (two) times daily with a meal.     Yes Historical Provider, MD  metoprolol (LOPRESSOR) 50 MG  tablet Take 0.5 tablets (25 mg total) by mouth 2 (two) times daily. FOR HEART/BLOOD PRESSURE. HOLD FOR SYSTOLIC BLOOD PRESSURE LESS THAN 1OO/HEART RATE LESS THAN 60 05/20/11  Yes Annita Brod, MD  pantoprazole (PROTONIX) 40 MG tablet Take 40 mg by mouth daily.   Yes Historical Provider, MD  pioglitazone (ACTOS) 30 MG tablet Take 30 mg by mouth daily.    Yes Historical Provider, MD  spironolactone (ALDACTONE) 25 MG tablet Take 25 mg by mouth daily.     Yes Historical Provider, MD  tamsulosin (FLOMAX) 0.4 MG CAPS Take 0.4 mg by mouth daily.    Yes Historical Provider, MD  levofloxacin (LEVAQUIN) 500 MG tablet Take 1 tablet (500 mg total) by mouth daily. 03/26/14   Maudry Diego, MD  traMADol (ULTRAM) 50 MG tablet Take 1 tablet (50 mg total) by mouth every 6 (six) hours as needed. 03/26/14   Maudry Diego, MD   BP 135/53  Pulse 58  Temp(Src) 98.5 F (36.9 C) (Oral)  Resp 18  Ht 5\' 9"  (1.753 m)  Wt 280 lb (127.007 kg)  BMI 41.33 kg/m2  SpO2 95% Physical Exam  Constitutional: He is oriented to person, place, and time. He appears well-developed.  HENT:  Head: Normocephalic.  Eyes: Conjunctivae and EOM are normal. No scleral icterus.  Neck: Neck supple. No thyromegaly present.  Cardiovascular: Normal rate and regular rhythm.  Exam reveals no gallop and no friction rub.   No murmur heard. Pulmonary/Chest: No stridor. He has no wheezes. He has no rales. He exhibits no tenderness.  Abdominal: He exhibits no distension. There is tenderness. There is no rebound.  Mild tender epigastric area  Musculoskeletal: Normal range of motion. He exhibits no edema.  Lymphadenopathy:    He has no cervical adenopathy.  Neurological: He is oriented to person, place, and time. He exhibits normal muscle tone. Coordination normal.  Skin: No rash noted. No erythema.  Psychiatric: He has a normal mood and affect. His behavior is normal.    ED Course  Procedures (including critical care time) Labs  Review Labs Reviewed  CBC - Abnormal; Notable for the following:    RBC 3.87 (*)    Hemoglobin 11.6 (*)    HCT 35.8 (*)    All other components within normal limits  BASIC METABOLIC PANEL - Abnormal; Notable for the following:    Glucose, Bld 109 (*)    GFR calc non Af Amer 72 (*)    GFR calc Af Amer 83 (*)    All other components within normal limits  HEPATIC FUNCTION PANEL - Abnormal; Notable for the following:    Albumin 3.1 (*)    All other components within normal limits  LIPASE, BLOOD - Abnormal; Notable for the following:    Lipase 85 (*)    All other components within normal limits    Imaging Review Dg Chest 2 View  03/26/2014  CLINICAL DATA:  Cough  EXAM: CHEST  2 VIEW  COMPARISON:  11/21/2013  FINDINGS: Cardiac shadow is within normal limits. Postsurgical changes are again seen. The lungs are clear bilaterally. No acute bony abnormality is seen.  IMPRESSION: No active cardiopulmonary disease.   Electronically Signed   By: Inez Catalina M.D.   On: 03/26/2014 12:46     EKG Interpretation None      MDM   Final diagnoses:  Bronchitis  Elevated lipase      Maudry Diego, MD 03/26/14 1454

## 2014-03-27 ENCOUNTER — Encounter (HOSPITAL_COMMUNITY): Payer: Self-pay | Admitting: Emergency Medicine

## 2014-03-27 ENCOUNTER — Emergency Department (HOSPITAL_COMMUNITY)
Admission: EM | Admit: 2014-03-27 | Discharge: 2014-03-28 | Disposition: A | Discharge status: Home / Self Care | Payer: Medicare Other | Attending: Emergency Medicine | Admitting: Emergency Medicine

## 2014-03-27 DIAGNOSIS — N181 Chronic kidney disease, stage 1: Secondary | ICD-10-CM | POA: Diagnosis not present

## 2014-03-27 DIAGNOSIS — R062 Wheezing: Secondary | ICD-10-CM | POA: Diagnosis present

## 2014-03-27 DIAGNOSIS — J40 Bronchitis, not specified as acute or chronic: Secondary | ICD-10-CM | POA: Diagnosis not present

## 2014-03-27 DIAGNOSIS — I10 Essential (primary) hypertension: Secondary | ICD-10-CM | POA: Diagnosis not present

## 2014-03-27 DIAGNOSIS — Z8619 Personal history of other infectious and parasitic diseases: Secondary | ICD-10-CM | POA: Diagnosis not present

## 2014-03-27 DIAGNOSIS — Z8673 Personal history of transient ischemic attack (TIA), and cerebral infarction without residual deficits: Secondary | ICD-10-CM | POA: Insufficient documentation

## 2014-03-27 DIAGNOSIS — Z87891 Personal history of nicotine dependence: Secondary | ICD-10-CM | POA: Diagnosis not present

## 2014-03-27 DIAGNOSIS — R9431 Abnormal electrocardiogram [ECG] [EKG]: Secondary | ICD-10-CM | POA: Diagnosis not present

## 2014-03-27 DIAGNOSIS — I2510 Atherosclerotic heart disease of native coronary artery without angina pectoris: Secondary | ICD-10-CM | POA: Insufficient documentation

## 2014-03-27 DIAGNOSIS — Z792 Long term (current) use of antibiotics: Secondary | ICD-10-CM | POA: Diagnosis not present

## 2014-03-27 DIAGNOSIS — E1129 Type 2 diabetes mellitus with other diabetic kidney complication: Secondary | ICD-10-CM | POA: Diagnosis not present

## 2014-03-27 DIAGNOSIS — Z794 Long term (current) use of insulin: Secondary | ICD-10-CM | POA: Diagnosis not present

## 2014-03-27 DIAGNOSIS — R609 Edema, unspecified: Secondary | ICD-10-CM | POA: Insufficient documentation

## 2014-03-27 DIAGNOSIS — H409 Unspecified glaucoma: Secondary | ICD-10-CM | POA: Diagnosis not present

## 2014-03-27 DIAGNOSIS — Z79899 Other long term (current) drug therapy: Secondary | ICD-10-CM | POA: Diagnosis not present

## 2014-03-27 DIAGNOSIS — K219 Gastro-esophageal reflux disease without esophagitis: Secondary | ICD-10-CM | POA: Insufficient documentation

## 2014-03-27 LAB — CBG MONITORING, ED
Glucose-Capillary: 44 mg/dL — CL (ref 70–99)
Glucose-Capillary: 70 mg/dL (ref 70–99)

## 2014-03-27 MED ORDER — GLUCOSE-VITAMIN C 4-6 GM-MG PO CHEW
CHEWABLE_TABLET | ORAL | Status: AC
  Administered 2014-03-27: 4
  Filled 2014-03-27: qty 1

## 2014-03-27 MED ORDER — IPRATROPIUM-ALBUTEROL 0.5-2.5 (3) MG/3ML IN SOLN
3.0000 mL | Freq: Once | RESPIRATORY_TRACT | Status: AC
  Administered 2014-03-28: 3 mL via RESPIRATORY_TRACT
  Filled 2014-03-27: qty 3

## 2014-03-27 NOTE — ED Notes (Addendum)
Pt states he feels like blood sugar was dropping and felt weak, CBG 44, pt chewed 4 tablets and ginger ale, peanut butter crackers given to pt to eat as well

## 2014-03-27 NOTE — ED Notes (Signed)
Pt has wheezing started today.

## 2014-03-27 NOTE — ED Provider Notes (Addendum)
CSN: [id number]     Arrival date & time 03/27/14  2051 History  This chart was scribed for Varney Biles, MD by Ladene Artist, ED Scribe. The patient was seen in room APA11/APA11. Patient's care was started at 11:46 PM.   Chief Complaint  Patient presents with  . Wheezing   The history is provided by the patient. No language interpreter was used.   HPI Comments: [name] is a 73 y.o. male, with a h/o coronary artery bypass, who presents to the Emergency Department complaining of constant wheezing onset this morning. Pt reports associated SOB and productive cough with CLEAR mucous. Pt has no chest pain. He has some dib, which is not exertional. Pt has had some common cold like symptoms and cough, but the wheezing started today. Recently seen in the ER and sent home with Levaquin for the cough. He denies fever, chills, night sweats. No h/o blood clots. No h/o lung disease. No sick contacts. Pt has LLE swelling, but that has been present since CABG, and is not worse than usual, and he has no leg pain. CNG is noted to be low. States that he had no supper, took his insulin as prescribed. No dizziness, diophoresis at this time.  Past Medical History  Diagnosis Date  . Hypertension   . Coronary artery disease   . Diabetes mellitus   . C. difficile diarrhea   . Peripheral vascular disease   . 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 Glaucoma   . GERD (gastroesophageal reflux disease)   . Sleep apnea   . 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 Stroke 2008, 2014, 2015   Past Surgical History  Procedure Laterality Date  . Cholecystectomy    . Coronary artery bypass graft    . Thyroid surgery    . No past surgeries     Family History  Problem Relation Age of Onset  . Diabetes Mother   . Heart failure Mother   . Hypertension Mother   . Diabetes Father   . Heart failure Father   . Hypertension Father   . Diabetes Sister   . Heart  failure Sister   . Hypertension Sister   . Hyperlipidemia Sister   . Diabetes Brother   . Heart failure Brother   . Hypertension Brother    History  Substance Use Topics  . Smoking status: Former Research scientist (life sciences)  . Smokeless tobacco: Never Used  . Alcohol Use: No    Review of Systems  Constitutional: Negative for fever, chills and diaphoresis.  Respiratory: Positive for cough, shortness of breath and wheezing.   Cardiovascular: Positive for leg swelling.  All other systems reviewed and are negative.   Allergies  Review of patient's allergies indicates no known allergies.  Home Medications   Prior to Admission medications   Medication Sig Start Date End Date Taking? Authorizing Provider  amLODipine (NORVASC) 10 MG tablet Take 10 mg by mouth daily.   Yes Historical Provider, MD  atorvastatin (LIPITOR) 80 MG tablet Take 40 mg by mouth every evening.    Yes Historical Provider, MD  brimonidine (ALPHAGAN) 0.2 % ophthalmic solution Place 1 drop into both eyes 3 (three) times daily.   Yes Historical Provider, MD  dipyridamole-aspirin (AGGRENOX) 200-25 MG per 12 hr capsule Take 1 capsule by mouth 2 (two) times daily.   Yes Historical Provider, MD  dorzolamide-timolol (COSOPT) 22.3-6.8 MG/ML ophthalmic solution Place 1 drop into both  eyes 2 (two) times daily.   Yes Historical Provider, MD  guaiFENesin-dextromethorphan (ROBITUSSIN DM) 100-10 MG/5ML syrup Take 5 mLs by mouth every 4 (four) hours as needed for cough.   Yes Historical Provider, MD  insulin aspart protamine-insulin aspart (NOVOLOG 70/30) (70-30) 100 UNIT/ML injection Inject 40-50 Units into the skin 2 (two) times daily with a meal. Inject 40 units in AM and 50 unit in PM   Yes Historical Provider, MD  latanoprost (XALATAN) 0.005 % ophthalmic solution Place 1 drop into both eyes at bedtime.   Yes Historical Provider, MD  levofloxacin (LEVAQUIN) 500 MG tablet Take 1 tablet (500 mg total) by mouth daily. 03/26/14  Yes Maudry Diego, MD   losartan (COZAAR) 100 MG tablet Take 100 mg by mouth daily.     Yes Historical Provider, MD  metFORMIN (GLUCOPHAGE) 1000 MG tablet Take 1,000 mg by mouth 2 (two) times daily with a meal.     Yes Historical Provider, MD  metoprolol (LOPRESSOR) 50 MG tablet Take 0.5 tablets (25 mg total) by mouth 2 (two) times daily. FOR HEART/BLOOD PRESSURE. HOLD FOR SYSTOLIC BLOOD PRESSURE LESS THAN 1OO/HEART RATE LESS THAN 60 05/20/11  Yes Annita Brod, MD  pantoprazole (PROTONIX) 40 MG tablet Take 40 mg by mouth daily.   Yes Historical Provider, MD  pioglitazone (ACTOS) 30 MG tablet Take 30 mg by mouth daily.    Yes Historical Provider, MD  spironolactone (ALDACTONE) 25 MG tablet Take 25 mg by mouth daily.     Yes Historical Provider, MD  tamsulosin (FLOMAX) 0.4 MG CAPS Take 0.4 mg by mouth daily.    Yes Historical Provider, MD  traMADol (ULTRAM) 50 MG tablet Take 1 tablet (50 mg total) by mouth every 6 (six) hours as needed. 03/26/14  Yes Maudry Diego, MD   Triage Vitals: BP 145/62  Pulse 63  Temp(Src) 98.4 F (36.9 C) (Oral)  Resp 20  Ht 5\' 9"  (1.753 m)  Wt 285 lb (129.275 kg)  BMI 42.07 kg/m2  SpO2 97% Physical Exam  Nursing note and vitals reviewed. Constitutional: He is oriented to person, place, and time. He appears well-developed and well-nourished. No distress.  HENT:  Head: Normocephalic and atraumatic.  Eyes: EOM are normal.  Neck: Neck supple. No tracheal deviation present.  Cardiovascular: Normal rate and regular rhythm.   Pulmonary/Chest: Effort normal. No respiratory distress. He has wheezes (fine wheezing appretciated diffusely).  Abdominal: Bowel sounds are normal.  Musculoskeletal: Normal range of motion. He exhibits edema.  Neurological: He is alert and oriented to person, place, and time.  Skin: Skin is warm and dry.  Psychiatric: He has a normal mood and affect. His behavior is normal.   ED Course  Procedures (including critical care time) DIAGNOSTIC STUDIES: Oxygen  Saturation is 97% on RA, normal by my interpretation.    COORDINATION OF CARE: 11:50 PM-Discussed treatment plan with pt at bedside and pt agreed to plan.   Labs Review Labs Reviewed - No data to display  Imaging Review Dg Chest 2 View  03/26/2014   CLINICAL DATA:  Cough  EXAM: CHEST  2 VIEW  COMPARISON:  11/21/2013  FINDINGS: Cardiac shadow is within normal limits. Postsurgical changes are again seen. The lungs are clear bilaterally. No acute bony abnormality is seen.  IMPRESSION: No active cardiopulmonary disease.   Electronically Signed   By: Inez Catalina M.D.   On: 03/26/2014 12:46     EKG Interpretation   Date/Time:  Thursday Mar 28 2014 00:46:46  EDT Ventricular Rate:  57 PR Interval:  216 QRS Duration: 88 QT Interval:  611 QTC Calculation: 595 R Axis:   -61 Text Interpretation:  Sinus rhythm Inferior infarct, old Lateral leads are  also involved Prolonged QT interval Confirmed by Kathrynn Humble, MD, Thelma Comp  [id number]) on 03/28/2014 1:42:48 AM      MDM   Final diagnoses:  None    Pt comes in with cc of wheezing. New onset wheezing. Pt has CAD hx, no CHF. He has had some uri like sx with cough, but wheezing started today. No hx of lung disease, allergy hx. No hx of PE, DVT, and the suspicion is very low given acute nature of wheezing, with normal vitals.  CXR is showing some atelectasis. I am suspecting bronchitis - and will add prednisone and albuterol inhaler to his Levaquin. I discussed at length with the family about other etiologies possible - and need for close f/u. Luckily, they see the pcp on Friday.  Other possibilities to consider include allergic rxn, CHF, PE, new lung dz Return precautions discussed. Pt coached on how to use the inhaler by RT/RN.  Finally, pt has prolonged QTc on his ekg, with no concerning symptoms of syncope/palpitations. Repeat EKG and med reconciliation should be suffice when they see pcp.  I personally performed the services described in  this documentation, which was scribed in my presence. The recorded information has been reviewed and is accurate.     Varney Biles, MD 03/28/14 [id number]  Varney Biles, MD 03/28/14 [id number]  Varney Biles, MD 04/11/14 [id number]

## 2014-03-27 NOTE — ED Notes (Signed)
Pt seen for productive cough yesterday, prescribed an antibiotic, pt states he had some wheezing but after coughing wheezing had stopped, no wheezing noted at this time

## 2014-03-27 NOTE — ED Notes (Signed)
Feeding patient

## 2014-03-28 ENCOUNTER — Emergency Department (HOSPITAL_COMMUNITY): Payer: Medicare Other

## 2014-03-28 LAB — URINALYSIS, ROUTINE W REFLEX MICROSCOPIC
Bilirubin Urine: NEGATIVE
Glucose, UA: NEGATIVE mg/dL
Hgb urine dipstick: NEGATIVE
Ketones, ur: NEGATIVE mg/dL
Leukocytes, UA: NEGATIVE
Nitrite: NEGATIVE
Protein, ur: NEGATIVE mg/dL
Specific Gravity, Urine: >1.03 — ABNORMAL HIGH (ref 1.005–1.030)
Urobilinogen, UA: 0.2 mg/dL (ref 0.0–1.0)
pH: 5.5 (ref 5.0–8.0)

## 2014-03-28 LAB — CBC WITH DIFFERENTIAL/PLATELET
Basophils Absolute: 0 10*3/uL (ref 0.0–0.1)
Basophils Relative: 0 % (ref 0–1)
Eosinophils Absolute: 0.2 10*3/uL (ref 0.0–0.7)
Eosinophils Relative: 3 % (ref 0–5)
HCT: 37.7 % — ABNORMAL LOW (ref 39.0–52.0)
Hemoglobin: 12 g/dL — ABNORMAL LOW (ref 13.0–17.0)
Lymphocytes Relative: 36 % (ref 12–46)
Lymphs Abs: 2.1 10*3/uL (ref 0.7–4.0)
MCH: 29.6 pg (ref 26.0–34.0)
MCHC: 31.8 g/dL (ref 30.0–36.0)
MCV: 93.1 fL (ref 78.0–100.0)
Monocytes Absolute: 0.6 10*3/uL (ref 0.1–1.0)
Monocytes Relative: 11 % (ref 3–12)
Neutro Abs: 3 10*3/uL (ref 1.7–7.7)
Neutrophils Relative %: 50 % (ref 43–77)
Platelets: 230 10*3/uL (ref 150–400)
RBC: 4.05 MIL/uL — ABNORMAL LOW (ref 4.22–5.81)
RDW: 13.9 % (ref 11.5–15.5)
WBC: 5.9 10*3/uL (ref 4.0–10.5)

## 2014-03-28 LAB — BASIC METABOLIC PANEL
BUN: 14 mg/dL (ref 6–23)
CO2: 29 mEq/L (ref 19–32)
Calcium: 8.6 mg/dL (ref 8.4–10.5)
Chloride: 99 mEq/L (ref 96–112)
Creatinine, Ser: 1.08 mg/dL (ref 0.50–1.35)
GFR calc Af Amer: 78 mL/min — ABNORMAL LOW (ref >90)
GFR calc non Af Amer: 67 mL/min — ABNORMAL LOW (ref >90)
Glucose, Bld: 62 mg/dL — ABNORMAL LOW (ref 70–99)
Potassium: 3.5 mEq/L — ABNORMAL LOW (ref 3.7–5.3)
Sodium: 140 mEq/L (ref 137–147)

## 2014-03-28 LAB — TROPONIN I: Troponin I: <0.3 ng/mL (ref <0.30)

## 2014-03-28 LAB — PRO B NATRIURETIC PEPTIDE: Pro B Natriuretic peptide (BNP): 336.5 pg/mL — ABNORMAL HIGH (ref 0–125)

## 2014-03-28 MED ORDER — ALBUTEROL SULFATE HFA 108 (90 BASE) MCG/ACT IN AERS
2.0000 | INHALATION_SPRAY | Freq: Once | RESPIRATORY_TRACT | Status: AC
  Administered 2014-03-28: 2 via RESPIRATORY_TRACT
  Filled 2014-03-28: qty 6.7

## 2014-03-28 MED ORDER — PREDNISONE 50 MG PO TABS
60.0000 mg | ORAL_TABLET | Freq: Once | ORAL | Status: AC
  Administered 2014-03-28: 60 mg via ORAL
  Filled 2014-03-28 (×2): qty 1

## 2014-03-28 NOTE — Discharge Instructions (Signed)
Please take the meds provided. See your doctor on Friday as planned. Return to the ER if the symptoms get worse, you have trouble breathing, you feel like you might faint, or have chest pains. USE INHALER EVERY 4 HOURS FOR WHEEZING.  Bronchitis Bronchitis is inflammation of the airways that extend from the windpipe into the lungs (bronchi). The inflammation often causes mucus to develop, which leads to a cough. If the inflammation becomes severe, it may cause shortness of breath. CAUSES  Bronchitis may be caused by:   Viral infections.   Bacteria.   Cigarette smoke.   Allergens, pollutants, and other irritants.  SIGNS AND SYMPTOMS  The most common symptom of bronchitis is a frequent cough that produces mucus. Other symptoms include:  Fever.   Body aches.   Chest congestion.   Chills.   Shortness of breath.   Sore throat.  DIAGNOSIS  Bronchitis is usually diagnosed through a medical history and physical exam. Tests, such as chest X-rays, are sometimes done to rule out other conditions.  TREATMENT  You may need to avoid contact with whatever caused the problem (smoking, for example). Medicines are sometimes needed. These may include:  Antibiotics. These may be prescribed if the condition is caused by bacteria.  Cough suppressants. These may be prescribed for relief of cough symptoms.   Inhaled medicines. These may be prescribed to help open your airways and make it easier for you to breathe.   Steroid medicines. These may be prescribed for those with recurrent (chronic) bronchitis. HOME CARE INSTRUCTIONS  Get plenty of rest.   Drink enough fluids to keep your urine clear or pale yellow (unless you have a medical condition that requires fluid restriction). Increasing fluids may help thin your secretions and will prevent dehydration.   Only take over-the-counter or prescription medicines as directed by your health care provider.  Only take antibiotics as  directed. Make sure you finish them even if you start to feel better.  Avoid secondhand smoke, irritating chemicals, and strong fumes. These will make bronchitis worse. If you are a smoker, quit smoking. Consider using nicotine gum or skin patches to help control withdrawal symptoms. Quitting smoking will help your lungs heal faster.   Put a cool-mist humidifier in your bedroom at night to moisten the air. This may help loosen mucus. Change the water in the humidifier daily. You can also run the hot water in your shower and sit in the bathroom with the door closed for 5 10 minutes.   Follow up with your health care provider as directed.   Wash your hands frequently to avoid catching bronchitis again or spreading an infection to others.  SEEK MEDICAL CARE IF: Your symptoms do not improve after 1 week of treatment.  SEEK IMMEDIATE MEDICAL CARE IF:  Your fever increases.  You have chills.   You have chest pain.   You have worsening shortness of breath.   You have bloody sputum.  You faint.  You have lightheadedness.  You have a severe headache.   You vomit repeatedly. MAKE SURE YOU:   Understand these instructions.  Will watch your condition.  Will get help right away if you are not doing well or get worse. Document Released: 10/18/2005 Document Revised: 08/08/2013 Document Reviewed: 06/12/2013 [facility] [place] Patient Information 2014 [place].

## 2014-03-28 NOTE — ED Notes (Signed)
Patient given discharge instruction, verbalized understand. Patient wheelchair out of the department.  

## 2014-03-28 NOTE — ED Notes (Signed)
Patient ambulated from room to nurse station and back to room. Patient oxygen level registered between 95 and 97 percent. Pulse rate at 79. Patient reminded he still needs to give a urine sample. Urinal given to him. Wife at bedside.

## 2014-03-29 ENCOUNTER — Inpatient Hospital Stay: Payer: Medicare Other | Admitting: Family Medicine

## 2014-04-02 ENCOUNTER — Encounter: Payer: Self-pay | Admitting: Family Medicine

## 2014-04-02 ENCOUNTER — Ambulatory Visit (INDEPENDENT_AMBULATORY_CARE_PROVIDER_SITE_OTHER): Payer: Medicare Other | Admitting: Family Medicine

## 2014-04-02 VITALS — BP 130/62 | HR 72 | Temp 98.2°F | Resp 14 | Ht 68.0 in | Wt 277.0 lb

## 2014-04-02 DIAGNOSIS — L600 Ingrowing nail: Secondary | ICD-10-CM

## 2014-04-02 DIAGNOSIS — IMO0002 Reserved for concepts with insufficient information to code with codable children: Secondary | ICD-10-CM

## 2014-04-02 DIAGNOSIS — I10 Essential (primary) hypertension: Secondary | ICD-10-CM

## 2014-04-02 DIAGNOSIS — IMO0001 Reserved for inherently not codable concepts without codable children: Secondary | ICD-10-CM

## 2014-04-02 DIAGNOSIS — E1165 Type 2 diabetes mellitus with hyperglycemia: Secondary | ICD-10-CM

## 2014-04-02 DIAGNOSIS — J029 Acute pharyngitis, unspecified: Secondary | ICD-10-CM

## 2014-04-02 DIAGNOSIS — R9431 Abnormal electrocardiogram [ECG] [EKG]: Secondary | ICD-10-CM

## 2014-04-02 DIAGNOSIS — J209 Acute bronchitis, unspecified: Secondary | ICD-10-CM

## 2014-04-02 LAB — HEMOGLOBIN A1C, FINGERSTICK: Hgb A1C (fingerstick): 6.6 % — ABNORMAL HIGH (ref <5.7)

## 2014-04-02 MED ORDER — PREDNISONE 10 MG PO TABS: ORAL_TABLET | ORAL | Status: DC

## 2014-04-02 NOTE — Assessment & Plan Note (Signed)
Blood pressure is well controlled 

## 2014-04-02 NOTE — Patient Instructions (Addendum)
Start prednisone tablets for bronchitis Gargle warm salt water Use albuterol as needed for wheezing Decrease insulin to 45 units at night A1C is 6.6%  Epsom salt for left toe CHANGE FOLLOW-UP TOP AUGUST

## 2014-04-02 NOTE — Assessment & Plan Note (Signed)
Now resolved.  

## 2014-04-02 NOTE — Assessment & Plan Note (Signed)
Gargle with warm salt water. He's been put on prednisone which may help some of the mild the uvula swelling that I see. He is now off of antibiotics query if this was related.

## 2014-04-02 NOTE — Progress Notes (Signed)
Patient ID: [name], male   DOB: 1941-10-20, 73 y.o.   MRN: [id number]   Subjective:    Patient ID: [name], male    DOB: [birth year], 73 y.o.   MRN: [id number]  Patient presents for Hospital F/U  patient here to followup hospital visit. He was seen in the ER secondary to bronchitis and shortness of breath he was actually seen on May 26 male in 27. Of note I are he called in a Z-Pak which she had completed but his symptoms had worsened. Initial chest x-ray was clear the second chest x-ray showed some atelectasis and mild bronchitic changes. He did complete a course of Levaquin and he has not been around a lot and he does not use on a regular basis. He also had some tramadol given secondary to some pleuritic chest pain from coughing. He had an EKG done which showed a prolonged QTC around 611 and he is here to followup these. He also had his colonoscopy done recently and I reviewed the results of that. His cough has improved he still gets some wheezing almost daily. He also has had some sore throat which started yesterday had difficulty swallowing his pills felt like his throat was a little bit swollen. No chest pain or recent shortness of breath.  He has an appoint with podiatry on Friday but has had some soreness in his left great toe nail he denies any injury no drainage no swelling.  Review Of Systems:  GEN- denies fatigue, fever, weight loss,weakness, recent illness HEENT- denies eye drainage, change in vision, nasal discharge, CVS- denies chest pain, palpitations RESP- denies SOB, +cough, wheeze ABD- denies N/V, change in stools, abd pain GU- denies dysuria, hematuria, dribbling, incontinence MSK- denies joint pain, muscle aches, injury Neuro- denies headache, dizziness, syncope, seizure activity       Objective:    BP 130/62  Pulse 72  Temp(Src) 98.2 F (36.8 C) (Oral)  Resp 14  Ht 5\' 8"  (1.727 m)  Wt 277 lb (125.646 kg)  BMI 42.13 kg/m2  SpO2 96% GEN- NAD, alert and  oriented x3 HEENT- PERRL, EOMI, non injected sclera, pink conjunctiva, MMM, oropharynx  Mild injection, mild swelling of uvula, left eyelid droop,  Neck- Supple, no LAD CVS- RRR, no murmur RESP-CTAB, no wheeze, no rales EXT- No edema Skin Left great toenail- thickened, small amount of dry blood, ingrown, no erythema, no swelling,mild TTP along lateral border, no other open foot lesions Pulses- Radial, DP- 2+  EKG- NSR, LAD, QT 378       Assessment & Plan:      Problem List Items Addressed This Visit   Diabetes mellitus type II, uncontrolled   Relevant Orders      Hemoglobin A1C, fingerstick   Acute pharyngitis   Acute bronchitis - Primary      Note: This dictation was prepared with Dragon dictation along with smaller phrase technology. Any transcriptional errors that result from this process are unintentional.

## 2014-04-02 NOTE — Assessment & Plan Note (Signed)
Epsom salt soak, no acute infection, f/u podiatry

## 2014-04-02 NOTE — Assessment & Plan Note (Signed)
His A1c is 6.6% which for his age is causing some hypoglycemia. I will have him decrease his insulin to 45 units in the p.m. A, continue 43 units in the a.m.

## 2014-04-02 NOTE — Assessment & Plan Note (Signed)
Will add short course of prednisone helped the wheezing he'll continue the albuterol. No further antibiotics needed his oxygen sats are normal

## 2014-04-10 ENCOUNTER — Ambulatory Visit: Payer: Medicare Other | Admitting: Family Medicine

## 2014-04-22 ENCOUNTER — Ambulatory Visit (INDEPENDENT_AMBULATORY_CARE_PROVIDER_SITE_OTHER): Payer: Medicare Other | Admitting: Family Medicine

## 2014-04-22 ENCOUNTER — Encounter: Payer: Self-pay | Admitting: Family Medicine

## 2014-04-22 VITALS — BP 130/68 | HR 64 | Temp 98.1°F | Resp 16 | Ht 68.0 in | Wt 285.0 lb

## 2014-04-22 DIAGNOSIS — M7918 Myalgia, other site: Secondary | ICD-10-CM

## 2014-04-22 DIAGNOSIS — IMO0001 Reserved for inherently not codable concepts without codable children: Secondary | ICD-10-CM

## 2014-04-22 MED ORDER — TRAMADOL HCL 50 MG PO TABS: 50.0000 mg | ORAL_TABLET | Freq: Two times a day (BID) | ORAL | Status: DC | PRN

## 2014-04-22 MED ORDER — HYDROCODONE-ACETAMINOPHEN 5-325 MG PO TABS: 1.0000 | ORAL_TABLET | Freq: Four times a day (QID) | ORAL | Status: DC | PRN

## 2014-04-22 NOTE — Patient Instructions (Signed)
COntinue current medications Get the pain medication  Heating pad  F/u as previous

## 2014-04-22 NOTE — Progress Notes (Signed)
Patient ID: CIAN LABRIE, male   DOB: 09-20-1941, 73 y.o.   MRN: [id number]   Subjective:    Patient ID: [name], male    DOB: 1941/07/21, 73 y.o.   MRN: [id number]  Patient presents for R side pain  Patient here for interim visit for right side pain. He feels like he has to catch beneath his ribs. It is worse when he is sitting a long period time when he lays down to sleep. He has been taking some hydrocodone which he had at home which relieves the pain he sleeps just fine. He denies any cough shortness of breath chest pain abdominal pain denies any change in his bowels. Denies any dysuria. He's not had any fever or chills. He's not had any injury to his side. It actually started when he had a coughing a few weeks ago when he had to be treated for bronchitis, noted has improved since then    Review Of Systems:  GEN- denies fatigue, fever, weight loss,weakness, recent illness HEENT- denies eye drainage, change in vision, nasal discharge, CVS- denies chest pain, palpitations RESP- denies SOB, cough, wheeze ABD- denies N/V, change in stools, abd pain GU- denies dysuria, hematuria, dribbling, incontinence MSK- denies joint pain,+ muscle aches, injury Neuro- denies headache, dizziness, syncope, seizure activity       Objective:    BP 130/68  Pulse 64  Temp(Src) 98.1 F (36.7 C) (Oral)  Resp 16  Ht 5\' 8"  (1.727 m)  Wt 285 lb (129.275 kg)  BMI 43.34 kg/m2 GEN- NAD, alert and oriented x3, well appearing HEENT- PERRL, EOMI, non injected sclera, pink conjunctiva, MMM, oropharynx   CVS- RRR, no murmur RESP-CTAB, no wheeze, no rales ABD-NABS,soft, point tenderness below right ribs, no mass palpated, no fluctant area, no bruising over ribs, ND, no CVA tenderness EXT- No edema Pulses- Radial 2+      Assessment & Plan:      Problem List Items Addressed This Visit   None    Visit Diagnoses   Musculoskeletal pain    -  Primary    No systemic infections, no Chest pain, I think  his pain is a muscle catch at the ribs, even if there was a rib fracture which I doubt supportive care needed, given norco to use at bedtime, also refilled his tramadol which he has used on and off for arthritic pain, will hold on further imaging at this time       Note: This dictation was prepared with Dragon dictation along with smaller phrase technology. Any transcriptional errors that result from this process are unintentional.

## 2014-05-01 ENCOUNTER — Encounter: Payer: Self-pay | Admitting: Family Medicine

## 2014-06-19 ENCOUNTER — Ambulatory Visit (INDEPENDENT_AMBULATORY_CARE_PROVIDER_SITE_OTHER): Payer: Medicare Other | Admitting: Family Medicine

## 2014-06-19 ENCOUNTER — Encounter: Payer: Self-pay | Admitting: Family Medicine

## 2014-06-19 VITALS — BP 132/64 | HR 76 | Temp 98.0°F | Resp 14 | Ht 69.0 in | Wt 287.0 lb

## 2014-06-19 DIAGNOSIS — N183 Chronic kidney disease, stage 3 unspecified: Secondary | ICD-10-CM

## 2014-06-19 DIAGNOSIS — E669 Obesity, unspecified: Secondary | ICD-10-CM

## 2014-06-19 DIAGNOSIS — E1129 Type 2 diabetes mellitus with other diabetic kidney complication: Secondary | ICD-10-CM

## 2014-06-19 DIAGNOSIS — E785 Hyperlipidemia, unspecified: Secondary | ICD-10-CM

## 2014-06-19 DIAGNOSIS — I739 Peripheral vascular disease, unspecified: Secondary | ICD-10-CM

## 2014-06-19 NOTE — Patient Instructions (Signed)
Continue current medications F/U 4 months ( Schedule with wife- Merlyn Battaglia)

## 2014-06-20 ENCOUNTER — Encounter: Payer: Self-pay | Admitting: Family Medicine

## 2014-06-20 NOTE — Assessment & Plan Note (Signed)
A1C deteriorated some but still looks good, no change to insulin Weight gain noted, he is not watching his diet

## 2014-06-20 NOTE — Assessment & Plan Note (Signed)
At goal , no change to statin

## 2014-06-20 NOTE — Progress Notes (Signed)
Patient ID: NIRVAN MUCCIO, male   DOB: Apr 09, 1941, 73 y.o.   MRN: [id number]   Subjective:    Patient ID: [name], male    DOB: [birth year], 73 y.o.   MRN: [id number]  Patient presents for 3 month F/U   Seen at [facility] recently had fasting labs,   DM- A1C was 7.1% taking 43 units in AM and 50 at bedtime, no hypoglycemia, he went back to 50 when his appetite was better. LDL at goal < 100, has appt with podiatrist.  HTN- taking meds as prescribed, no CP, no SOB, feels good right now  Meds reviewed     Review Of Systems:  GEN- denies fatigue, fever, weight loss,weakness, recent illness HEENT- denies eye drainage, change in vision, nasal discharge, CVS- denies chest pain, palpitations RESP- denies SOB, cough, wheeze ABD- denies N/V, change in stools, abd pain GU- denies dysuria, hematuria, dribbling, incontinence MSK- denies joint pain, muscle aches, injury Neuro- denies headache, dizziness, syncope, seizure activity       Objective:    BP 132/64  Pulse 76  Temp(Src) 98 F (36.7 C) (Oral)  Resp 14  Ht 5\' 9"  (1.753 m)  Wt 287 lb (130.182 kg)  BMI 42.36 kg/m2 GEN- NAD, alert and oriented x3 HEENT- PERRL, EOMI, non injected sclera, pink conjunctiva, MMM, oropharynx clear left eyelid droop,  Neck- Supple, no LAD CVS- RRR, no murmur RESP-CTAB, no wheeze, no rales EXT- No edema Pulses- Radial, DP- 2+      Assessment & Plan:      Problem List Items Addressed This Visit   None      Note: This dictation was prepared with Dragon dictation along with smaller phrase technology. Any transcriptional errors that result from this process are unintentional.

## 2014-08-08 ENCOUNTER — Ambulatory Visit (INDEPENDENT_AMBULATORY_CARE_PROVIDER_SITE_OTHER): Payer: Medicare Other | Admitting: *Deleted

## 2014-08-08 DIAGNOSIS — Z23 Encounter for immunization: Secondary | ICD-10-CM

## 2014-08-08 NOTE — Progress Notes (Signed)
Patient ID: [name], male   DOB: [birth year], 73 y.o.   MRN: [id number] Patient seen in office for Influenza Vaccination.   Tolerated IM administration well.

## 2014-08-16 ENCOUNTER — Other Ambulatory Visit: Payer: Self-pay

## 2014-10-21 ENCOUNTER — Encounter: Payer: Self-pay | Admitting: Family Medicine

## 2014-10-21 ENCOUNTER — Ambulatory Visit (INDEPENDENT_AMBULATORY_CARE_PROVIDER_SITE_OTHER): Payer: Medicare Other | Admitting: Family Medicine

## 2014-10-21 VITALS — BP 150/78 | HR 86 | Temp 98.3°F | Resp 18 | Ht 69.0 in | Wt 288.0 lb

## 2014-10-21 DIAGNOSIS — N058 Unspecified nephritic syndrome with other morphologic changes: Secondary | ICD-10-CM

## 2014-10-21 DIAGNOSIS — N183 Chronic kidney disease, stage 3 unspecified: Secondary | ICD-10-CM

## 2014-10-21 DIAGNOSIS — I2510 Atherosclerotic heart disease of native coronary artery without angina pectoris: Secondary | ICD-10-CM

## 2014-10-21 DIAGNOSIS — E1129 Type 2 diabetes mellitus with other diabetic kidney complication: Secondary | ICD-10-CM

## 2014-10-21 DIAGNOSIS — B353 Tinea pedis: Secondary | ICD-10-CM

## 2014-10-21 DIAGNOSIS — I10 Essential (primary) hypertension: Secondary | ICD-10-CM

## 2014-10-21 NOTE — Assessment & Plan Note (Signed)
Restart metoprolol 25mg  BID, he wants to hold off on starting a new medication, consider changing to Coreg 3.125mg  BID

## 2014-10-21 NOTE — Progress Notes (Signed)
Patient ID: ORDIE BADGETT, male   DOB: [birth year], 73 y.o.   MRN: [id number]   Subjective:    Patient ID: [name], male    DOB: 1941-04-21, 73 y.o.   MRN: [id number]  Patient presents for 4 month F/U and Open Wound of B Feet  patient here to follow-up chronic medical problems. His wife noted that when she was helping him with his back that he had some bleeding beneath his nails where his skin had cracked P also had some cracking between the toes. He denies any pain. He does follow with the podiatrist at the [facility]. Diabetes mellitus his last A1c was 7.1% he has been given 42 units in the morning and 50 units at night his fasting blood sugars are 120 to 130s but his evening sugars have been trending upward toward the 180 no hypoglycemia symptoms. He is not very active and is not change his diet since her last visit.   Note he did stop taking his metoprolol on the past month or so he states that he was having some dizziness and therefore stopped that pill his dizziness did improve some but his blood pressures been elevated he was to go back on the medication. When we discussed trying something else because this caused dizziness he states that he was unable to afford anything else at this time I'll prefer trying again to see if the symptoms came back.    Review Of Systems:  GEN- denies fatigue, fever, weight loss,weakness, recent illness HEENT- denies eye drainage, change in vision, nasal discharge, CVS- denies chest pain, palpitations RESP- denies SOB, cough, wheeze ABD- denies N/V, change in stools, abd pain GU- denies dysuria, hematuria, dribbling, incontinence MSK- denies joint pain, muscle aches, injury Neuro- denies headache, dizziness, syncope, seizure activity       Objective:    BP 150/78 mmHg  Pulse 86  Temp(Src) 98.3 F (36.8 C) (Oral)  Resp 18  Ht 5\' 9"  (1.753 m)  Wt 288 lb (130.636 kg)  BMI 42.51 kg/m2 GEN- NAD, alert and oriented x3 HEENT- PERRL,  EOMI, mild left lid droop non injected sclera, pink conjunctiva, MMM, oropharynx clear CVS- RRR, no murmur RESP-CTAB EXT- No edema Skin- drying skin, mild cracking, maceration left foot between 3rd- 4th digits, thick nails, no active bleeding, no erythema, no drainage Pulses- Radial, DP- 2+        Assessment & Plan:      Problem List Items Addressed This Visit      Unprioritized   Tinea pedis    Mild symptoms,mostly cracking, one area of maceration, use hair dryer to feet, apply golds bond powder F/u podiatry at [facility]    HTN (hypertension) (Chronic)    Restart metoprolol 25mg  BID, he wants to hold off on starting a new medication, consider changing to Coreg 3.125mg  BID    DM (diabetes mellitus) type II controlled with renal manifestation - Primary    Daytime CBG a little high, increase morning insulin to 43units Recheck A1C    Relevant Orders      Hemoglobin A1c      COMPLETE METABOLIC PANEL WITH GFR   Chronic kidney disease   CAD (coronary artery disease)    Recent cholesterol wnl scanned in No recent CP, no SOB Need better glucose control       Note: This dictation was prepared with Dragon dictation along with smaller phrase technology. Any transcriptional errors that result from this process are unintentional.

## 2014-10-21 NOTE — Assessment & Plan Note (Signed)
Mild symptoms,mostly cracking, one area of maceration, use hair dryer to feet, apply golds bond powder F/u podiatry at [facility]

## 2014-10-21 NOTE — Assessment & Plan Note (Signed)
Recent cholesterol wnl scanned in No recent CP, no SOB Need better glucose control

## 2014-10-21 NOTE — Assessment & Plan Note (Signed)
Daytime CBG a little high, increase morning insulin to 43units Recheck A1C

## 2014-10-21 NOTE — Patient Instructions (Signed)
Restart the metoprolol 1/2 tablet twice a day, call for any dizzy symptoms Use gold Bonds powder, hair dryer to feet We will call with lab results Morning insulin 43 units and continue 50 units at bedtime F/U 3 months

## 2014-10-22 LAB — HEMOGLOBIN A1C
Hgb A1c MFr Bld: 7.5 % — ABNORMAL HIGH (ref <5.7)
Mean Plasma Glucose: 169 mg/dL — ABNORMAL HIGH (ref <117)

## 2014-10-22 LAB — COMPLETE METABOLIC PANEL WITH GFR
ALT: 22 U/L (ref 0–53)
AST: 17 U/L (ref 0–37)
Albumin: 3.9 g/dL (ref 3.5–5.2)
Alkaline Phosphatase: 48 U/L (ref 39–117)
BUN: 16 mg/dL (ref 6–23)
CO2: 26 mEq/L (ref 19–32)
Calcium: 8.8 mg/dL (ref 8.4–10.5)
Chloride: 104 mEq/L (ref 96–112)
Creat: 1.08 mg/dL (ref 0.50–1.35)
GFR, Est African American: 79 mL/min
GFR, Est Non African American: 69 mL/min
Glucose, Bld: 135 mg/dL — ABNORMAL HIGH (ref 70–99)
Potassium: 4.3 mEq/L (ref 3.5–5.3)
Sodium: 139 mEq/L (ref 135–145)
Total Bilirubin: 0.5 mg/dL (ref 0.2–1.2)
Total Protein: 6.8 g/dL (ref 6.0–8.3)

## 2014-11-26 ENCOUNTER — Encounter: Payer: Self-pay | Admitting: Family Medicine

## 2015-01-08 ENCOUNTER — Encounter: Payer: Self-pay | Admitting: Family Medicine

## 2015-01-22 ENCOUNTER — Ambulatory Visit (INDEPENDENT_AMBULATORY_CARE_PROVIDER_SITE_OTHER): Payer: Medicare Other | Admitting: Family Medicine

## 2015-01-22 ENCOUNTER — Encounter: Payer: Self-pay | Admitting: Family Medicine

## 2015-01-22 VITALS — BP 132/78 | HR 78 | Temp 98.4°F | Resp 16 | Ht 69.0 in | Wt 290.0 lb

## 2015-01-22 DIAGNOSIS — E785 Hyperlipidemia, unspecified: Secondary | ICD-10-CM | POA: Diagnosis not present

## 2015-01-22 DIAGNOSIS — R609 Edema, unspecified: Secondary | ICD-10-CM

## 2015-01-22 DIAGNOSIS — R600 Localized edema: Secondary | ICD-10-CM | POA: Insufficient documentation

## 2015-01-22 DIAGNOSIS — N183 Chronic kidney disease, stage 3 unspecified: Secondary | ICD-10-CM

## 2015-01-22 DIAGNOSIS — E119 Type 2 diabetes mellitus without complications: Secondary | ICD-10-CM | POA: Diagnosis not present

## 2015-01-22 DIAGNOSIS — G4733 Obstructive sleep apnea (adult) (pediatric): Secondary | ICD-10-CM | POA: Diagnosis not present

## 2015-01-22 DIAGNOSIS — I10 Essential (primary) hypertension: Secondary | ICD-10-CM

## 2015-01-22 DIAGNOSIS — E1129 Type 2 diabetes mellitus with other diabetic kidney complication: Secondary | ICD-10-CM

## 2015-01-22 DIAGNOSIS — N058 Unspecified nephritic syndrome with other morphologic changes: Secondary | ICD-10-CM | POA: Diagnosis not present

## 2015-01-22 MED ORDER — FUROSEMIDE 20 MG PO TABS: 20.0000 mg | ORAL_TABLET | Freq: Every day | ORAL | Status: DC

## 2015-01-22 NOTE — Assessment & Plan Note (Signed)
His blood pressure looks good today. He will continue current medication

## 2015-01-22 NOTE — Assessment & Plan Note (Addendum)
He is currently using a sleep doctor. He was noted on the past 2 procedures that he stops breathing a lot even with oxygen therapy. This was prescribed by his sleep doctor with the [facility] I have recommended that he make a follow-up appointment with him he may need a titration done with his C Pap

## 2015-01-22 NOTE — Assessment & Plan Note (Signed)
Known peripheral arterial disease here now with some peripheral edema which is likely related to his diet as well as possible the recent surgery. I think he is low risk for DVTs in about a month since the surgery. I'm going to give him Lasix 20 mg for the next 5 days as he is already on spironolactone. He will continue using his compression hose and watch the salt in his diet

## 2015-01-22 NOTE — Progress Notes (Signed)
Patient ID: [name], male   DOB: 1941/08/16, 74 y.o.   MRN: [id number]   Subjective:    Patient ID: [name], male    DOB: 25-Jul-1941, 74 y.o.   MRN: [id number]  Patient presents for 3 month F/U  patient here to follow-up chronic medical problems. His only concern is that he's had some leg swelling worse over the past few weeks since he had his eye surgery for glaucoma. He has been trying to prop his leg up and he has been wearing his compression hose he is also taking Aldactone as prescribed. He denies any shortness of breath or any chest pain.  For his diabetes mellitus he states his blood sugars have been fairly good fasting they've been less than 120 in the after meals around 160-170. He did have 2 episodes of hypoglycemia where he did not eat and took his morning insulin.  He states he recently had HIS fasting labs done but he has not heard the results back  He did see podiatry at the [facility]:per above  GEN- denies fatigue, fever, weight loss,weakness, recent illness HEENT- denies eye drainage, change in vision, nasal discharge, CVS- denies chest pain, palpitations RESP- denies SOB, cough, wheeze ABD- denies N/V, change in stools, abd pain GU- denies dysuria, hematuria, dribbling, incontinence MSK- denies joint pain, muscle aches, injury Neuro- denies headache, dizziness, syncope, seizure activity       Objective:    BP 132/78 mmHg  Pulse 78  Temp(Src) 98.4 F (36.9 C) (Oral)  Resp 16  Ht 5\' 9"  (1.753 m)  Wt 290 lb (131.543 kg)  BMI 42.81 kg/m2 GEN- NAD, alert and oriented x3 HEENT- PERRL, EOMI, non injected sclera, pink conjunctiva, MMM, oropharynx clear Neck- Supple, no JVD CVS- RRR, no murmur RESP-CTAB ABD-NABS,soft,NT,ND EXT- 1+  Edema L > R, vein stripped left leg Pulses- Radial, DP- 2+        Assessment & Plan:      Problem List Items Addressed This Visit    None      Note: This dictation was prepared with Dragon dictation along  with smaller phrase technology. Any transcriptional errors that result from this process are unintentional.

## 2015-01-22 NOTE — Patient Instructions (Addendum)
Request last set of fasting labs from [facility] the water pill for the next 5 days  Continue the insulin, if you drop decrease 1 unit  F/U 4 months

## 2015-01-22 NOTE — Assessment & Plan Note (Signed)
Discussed the importance of eating on a regular basis. He has gained a few pounds since her last visit. He will continue the same dose of insulin I will obtain his labs for his A1c and his cholesterol

## 2015-01-24 ENCOUNTER — Encounter: Payer: Self-pay | Admitting: *Deleted

## 2015-01-27 ENCOUNTER — Telehealth: Payer: Self-pay | Admitting: Family Medicine

## 2015-01-27 NOTE — Telephone Encounter (Signed)
Keep taking the current medications No more lasix at this time Keep wearing compression socks No SALT added to foods and drink water

## 2015-01-27 NOTE — Telephone Encounter (Signed)
Call placed to patient and patient made aware.  

## 2015-01-27 NOTE — Telephone Encounter (Signed)
-----   Message from Regina, LPN sent at [id number] 10:33 AM EDT ----- Regarding: RE: f/u leg swelling Call placed to patient.   Reports that edema is better in the mornings, but it returns by the evenings. States that edema is not as bad as it had been, but it does continue.   ----- Message -----    From: Alycia Rossetti, MD    Sent: 01/27/2015   9:53 AM      To: [facility] Lathe Six, LPN Subject: FW: f/u leg swelling                           Call pt see if leg swelling has gone down, took lasix for 5 days ----- Message -----    From: Alycia Rossetti, MD    Sent: 01/27/2015      To: Alycia Rossetti, MD Subject: f/u leg swelling

## 2015-02-10 ENCOUNTER — Encounter: Payer: Self-pay | Admitting: Family Medicine

## 2015-02-27 ENCOUNTER — Other Ambulatory Visit: Payer: Self-pay | Admitting: *Deleted

## 2015-02-27 DIAGNOSIS — E119 Type 2 diabetes mellitus without complications: Secondary | ICD-10-CM

## 2015-02-27 DIAGNOSIS — E785 Hyperlipidemia, unspecified: Secondary | ICD-10-CM

## 2015-03-13 DIAGNOSIS — E119 Type 2 diabetes mellitus without complications: Secondary | ICD-10-CM | POA: Diagnosis not present

## 2015-03-13 DIAGNOSIS — E785 Hyperlipidemia, unspecified: Secondary | ICD-10-CM | POA: Diagnosis not present

## 2015-03-13 LAB — LIPID PANEL
Cholesterol: 131 mg/dL (ref 0–200)
HDL: 40 mg/dL (ref >=40)
LDL Cholesterol: 74 mg/dL (ref 0–99)
Total CHOL/HDL Ratio: 3.3 Ratio
Triglycerides: 83 mg/dL (ref <150)
VLDL: 17 mg/dL (ref 0–40)

## 2015-03-13 LAB — HEMOGLOBIN A1C
Hgb A1c MFr Bld: 7.4 % — ABNORMAL HIGH (ref <5.7)
Mean Plasma Glucose: 166 mg/dL — ABNORMAL HIGH (ref <117)

## 2015-03-13 NOTE — Addendum Note (Signed)
Addended by: Gracie Gupta, [place] on: 03/13/2015 08:11 AM   Modules accepted: Orders

## 2015-03-17 ENCOUNTER — Encounter: Payer: Self-pay | Admitting: Family Medicine

## 2015-04-09 ENCOUNTER — Encounter: Payer: Self-pay | Admitting: Family Medicine

## 2015-04-09 ENCOUNTER — Ambulatory Visit (INDEPENDENT_AMBULATORY_CARE_PROVIDER_SITE_OTHER): Payer: Medicare Other | Admitting: Family Medicine

## 2015-04-09 VITALS — BP 138/74 | HR 82 | Temp 98.2°F | Resp 16 | Ht 69.0 in | Wt 303.0 lb

## 2015-04-09 DIAGNOSIS — E1129 Type 2 diabetes mellitus with other diabetic kidney complication: Secondary | ICD-10-CM | POA: Diagnosis not present

## 2015-04-09 DIAGNOSIS — Z0000 Encounter for general adult medical examination without abnormal findings: Secondary | ICD-10-CM | POA: Diagnosis not present

## 2015-04-09 DIAGNOSIS — I2510 Atherosclerotic heart disease of native coronary artery without angina pectoris: Secondary | ICD-10-CM | POA: Diagnosis not present

## 2015-04-09 DIAGNOSIS — R600 Localized edema: Secondary | ICD-10-CM

## 2015-04-09 DIAGNOSIS — Z23 Encounter for immunization: Secondary | ICD-10-CM

## 2015-04-09 DIAGNOSIS — N183 Chronic kidney disease, stage 3 unspecified: Secondary | ICD-10-CM

## 2015-04-09 DIAGNOSIS — N058 Unspecified nephritic syndrome with other morphologic changes: Secondary | ICD-10-CM | POA: Diagnosis not present

## 2015-04-09 DIAGNOSIS — R609 Edema, unspecified: Secondary | ICD-10-CM

## 2015-04-09 DIAGNOSIS — E669 Obesity, unspecified: Secondary | ICD-10-CM

## 2015-04-09 DIAGNOSIS — G4733 Obstructive sleep apnea (adult) (pediatric): Secondary | ICD-10-CM

## 2015-04-09 MED ORDER — FUROSEMIDE 20 MG PO TABS: 20.0000 mg | ORAL_TABLET | Freq: Every day | ORAL | Status: DC

## 2015-04-09 NOTE — Assessment & Plan Note (Signed)
Continue compression hose Restart lasix 20mg  once a day, plan for renal check in 1 week Diet aderence is very difficult for him

## 2015-04-09 NOTE — Assessment & Plan Note (Signed)
No chest pain, lipids normal, no change to statin, on aggrenox Will hold aggrenox 5 days before eye surgery

## 2015-04-09 NOTE — Progress Notes (Signed)
Patient ID: [name], male   DOB: [birth year], 74 y.o.   MRN: [id number] Subjective:   Patient presents for Medicare Annual/Subsequent preventive examination.  Patient here for her annual wellness exam. He has no particular concerns today. He is due to have possible eye surgery in the advised him he needs to come off of his Aggrenox he was not sure how many days he stated that they told him he may need to be off for 2 weeks. He has had some increasing pressures and his eyes and they may have to do some type of needling procedure to help relieve the pressure.  He has not been monitoring his diet he has been actually overeating and eating a lot of carbs and salty foods his weight is up 13 pounds since her last visit he is also still swelling. He states that the swelling does go down in the morning time but throughout the day it collects on his legs.  He has not had any changes done to his sleep apnea machine he states that it is being service today. His primary care provider for the [facility] is Dr. Nelson Chimes as and he will be seen him at the [place] location.  No 24-hour recall he had sausage biscuits with diet sun drop for breakfast he had Glucerna full lunch fried fish baked potato and sweet tea for dinner   Review Past Medical/Family/Social: - Per EMR   Risk Factors  Current exercise habits: None Dietary issues discussed: YES  Cardiac risk factors: Obesity (BMI >= 30 kg/m2). ,CVA,CAD  Depression Screen  (Note: if answer to either of the following is "Yes", a more complete depression screening is indicated)  Over the past two weeks, have you felt down, depressed or hopeless? No Over the past two weeks, have you felt little interest or pleasure in doing things? No Have you lost interest or pleasure in daily life? No Do you often feel hopeless? No Do you cry easily over simple problems? No   Activities of Daily Living  In your present state of health, do you have any  difficulty performing the following activities?:  Driving? No  Managing money? No  Feeding yourself? No  Getting from bed to chair? No  Climbing a flight of stairs? yes  Preparing food and eating?: No  Bathing or showering? No  Getting dressed: No  Getting to the toilet? No  Using the toilet:No  Moving around from place to place: yes In the past year have you fallen or had a near fall?:No  Are you sexually active? No  Do you have more than one partner? No   Hearing Difficulties: No  Do you often ask people to speak up or repeat themselves? No  Do you experience ringing or noises in your ears? No Do you have difficulty understanding soft or whispered voices? No  Do you feel that you have a problem with memory? No Do you often misplace items? No  Do you feel safe at home? Yes  Cognitive Testing  Alert? Yes Normal Appearance?Yes  Oriented to person? Yes Place? Yes  Time? Yes  Recall of three objects? Yes  Can perform simple calculations? Yes  Displays appropriate judgment?Yes  Can read the correct time from a watch face?Yes   List the Names of Other Physician/Practitioners you currently use:   - [facility]  - Cardiology   Screening Tests / Date Colonoscopy  -UTD  Zostavax -UTD  Influenza Vaccine UTD Tetanus/tdap -   ROS: GEN- denies fatigue, fever, weight loss,weakness, recent illness HEENT- denies eye drainage, change in vision, nasal discharge, CVS- denies chest pain, palpitations RESP- denies SOB, cough, wheeze ABD- denies N/V, change in stools, abd pain GU- denies dysuria, hematuria, dribbling, incontinence MSK- denies joint pain, muscle aches, injury Neuro- denies headache, dizziness, syncope, seizure activity   Physical: GEN- NAD, alert and oriented x3 HEENT- PERRL, EOMI, non injected sclera, pink conjunctiva, MMM, oropharynx clear Neck- Supple, no bruit CVS- RRR, no murmur RESP-CTAB EXT- chronic  Edema/venous stasis Pulses- Radial,  DP- 2+    Assessment:    Annual wellness medicare exam   Plan:    During the course of the visit the patient was educated and counseled about appropriate screening and preventive services including:  Prevnar 13 given  Diet review for nutrition referral? Yes ____ Not Indicated __x__  Patient Instructions (the written plan) was given to the patient.  Medicare Attestation  I have personally reviewed:  The patient's medical and social history  Their use of alcohol, tobacco or illicit drugs  Their current medications and supplements  The patient's functional ability including ADLs,fall risks, home safety risks, cognitive, and hearing and visual impairment  Diet and physical activities  Evidence for depression or mood disorders  The patient's weight, height, BMI, and visual acuity have been recorded in the chart. I have made referrals, counseling, and provided education to the patient based on review of the above and I have provided the patient with a written personalized care plan for preventive services.

## 2015-04-09 NOTE — Assessment & Plan Note (Signed)
Will contact his [facility], to see if he needs repeat Sleep study due to recurrent apnea even with therapy

## 2015-04-09 NOTE — Patient Instructions (Signed)
Restart lasix No Biscuits, potatoes, no fried foods, no diet soda, drink water Drink half and half sweet tea Prevnar 13 shot given  Aggrenox - stop 5 days before procedure  F/U 3 months

## 2015-04-09 NOTE — Assessment & Plan Note (Signed)
Fairly good control for his age, A1C 7.4% contineu lantus 43 in AM 50 units pM

## 2015-04-10 LAB — MICROALBUMIN / CREATININE URINE RATIO
Creatinine, Urine: 78.2 mg/dL
Microalb Creat Ratio: 23 mg/g (ref 0.0–30.0)
Microalb, Ur: 1.8 mg/dL (ref <2.0)

## 2015-04-14 ENCOUNTER — Encounter (HOSPITAL_COMMUNITY): Payer: Self-pay | Admitting: *Deleted

## 2015-04-14 ENCOUNTER — Emergency Department (HOSPITAL_COMMUNITY)
Admission: EM | Admit: 2015-04-14 | Discharge: 2015-04-14 | Disposition: A | Discharge status: Home / Self Care | Payer: Medicare Other | Attending: Emergency Medicine | Admitting: Emergency Medicine

## 2015-04-14 ENCOUNTER — Emergency Department (HOSPITAL_COMMUNITY): Payer: Medicare Other

## 2015-04-14 DIAGNOSIS — Z87891 Personal history of nicotine dependence: Secondary | ICD-10-CM | POA: Insufficient documentation

## 2015-04-14 DIAGNOSIS — Z8719 Personal history of other diseases of the digestive system: Secondary | ICD-10-CM | POA: Diagnosis not present

## 2015-04-14 DIAGNOSIS — M25561 Pain in right knee: Secondary | ICD-10-CM | POA: Diagnosis not present

## 2015-04-14 DIAGNOSIS — Z8619 Personal history of other infectious and parasitic diseases: Secondary | ICD-10-CM | POA: Diagnosis not present

## 2015-04-14 DIAGNOSIS — N181 Chronic kidney disease, stage 1: Secondary | ICD-10-CM | POA: Diagnosis not present

## 2015-04-14 DIAGNOSIS — Z951 Presence of aortocoronary bypass graft: Secondary | ICD-10-CM | POA: Diagnosis not present

## 2015-04-14 DIAGNOSIS — I129 Hypertensive chronic kidney disease with stage 1 through stage 4 chronic kidney disease, or unspecified chronic kidney disease: Secondary | ICD-10-CM | POA: Diagnosis not present

## 2015-04-14 DIAGNOSIS — E1122 Type 2 diabetes mellitus with diabetic chronic kidney disease: Secondary | ICD-10-CM | POA: Insufficient documentation

## 2015-04-14 DIAGNOSIS — M11261 Other chondrocalcinosis, right knee: Secondary | ICD-10-CM | POA: Diagnosis not present

## 2015-04-14 DIAGNOSIS — Z8673 Personal history of transient ischemic attack (TIA), and cerebral infarction without residual deficits: Secondary | ICD-10-CM | POA: Insufficient documentation

## 2015-04-14 DIAGNOSIS — M1711 Unilateral primary osteoarthritis, right knee: Secondary | ICD-10-CM | POA: Diagnosis not present

## 2015-04-14 DIAGNOSIS — Z79899 Other long term (current) drug therapy: Secondary | ICD-10-CM | POA: Diagnosis not present

## 2015-04-14 DIAGNOSIS — I2510 Atherosclerotic heart disease of native coronary artery without angina pectoris: Secondary | ICD-10-CM | POA: Diagnosis not present

## 2015-04-14 DIAGNOSIS — H409 Unspecified glaucoma: Secondary | ICD-10-CM | POA: Diagnosis not present

## 2015-04-14 DIAGNOSIS — Z794 Long term (current) use of insulin: Secondary | ICD-10-CM | POA: Diagnosis not present

## 2015-04-14 MED ORDER — HYDROCODONE-ACETAMINOPHEN 5-325 MG PO TABS
2.0000 | ORAL_TABLET | Freq: Once | ORAL | Status: AC
  Administered 2015-04-14: 2 via ORAL
  Filled 2015-04-14: qty 2

## 2015-04-14 MED ORDER — HYDROCODONE-ACETAMINOPHEN 5-325 MG PO TABS: 1.0000 | ORAL_TABLET | ORAL | Status: DC | PRN

## 2015-04-14 NOTE — ED Notes (Signed)
Patient transported to X-ray 

## 2015-04-14 NOTE — Discharge Instructions (Signed)
Knee Pain The knee is the complex joint between your thigh and your lower leg. It is made up of bones, tendons, ligaments, and cartilage. The bones that make up the knee are:  The femur in the thigh.  The tibia and fibula in the lower leg.  The patella or kneecap riding in the groove on the lower femur. CAUSES  Knee pain is a common complaint with many causes. A few of these causes are:  Injury, such as:  A ruptured ligament or tendon injury.  Torn cartilage.  Medical conditions, such as:  Gout  Arthritis  Infections  Overuse, over training, or overdoing a physical activity. Knee pain can be minor or severe. Knee pain can accompany debilitating injury. Minor knee problems often respond well to self-care measures or get well on their own. More serious injuries may need medical intervention or even surgery. SYMPTOMS The knee is complex. Symptoms of knee problems can vary widely. Some of the problems are:  Pain with movement and weight bearing.  Swelling and tenderness.  Buckling of the knee.  Inability to straighten or extend your knee.  Your knee locks and you cannot straighten it.  Warmth and redness with pain and fever.  Deformity or dislocation of the kneecap. DIAGNOSIS  Determining what is wrong may be very straight forward such as when there is an injury. It can also be challenging because of the complexity of the knee. Tests to make a diagnosis may include:  Your caregiver taking a history and doing a physical exam.  Routine X-rays can be used to rule out other problems. X-rays will not reveal a cartilage tear. Some injuries of the knee can be diagnosed by:  Arthroscopy a surgical technique by which a small video camera is inserted through tiny incisions on the sides of the knee. This procedure is used to examine and repair internal knee joint problems. Tiny instruments can be used during arthroscopy to repair the torn knee cartilage (meniscus).  Arthrography  is a radiology technique. A contrast liquid is directly injected into the knee joint. Internal structures of the knee joint then become visible on X-ray film.  An MRI scan is a non X-ray radiology procedure in which magnetic fields and a computer produce two- or three-dimensional images of the inside of the knee. Cartilage tears are often visible using an MRI scanner. MRI scans have largely replaced arthrography in diagnosing cartilage tears of the knee.  Blood work.  Examination of the fluid that helps to lubricate the knee joint (synovial fluid). This is done by taking a sample out using a needle and a syringe. TREATMENT The treatment of knee problems depends on the cause. Some of these treatments are:  Depending on the injury, proper casting, splinting, surgery, or physical therapy care will be needed.  Give yourself adequate recovery time. Do not overuse your joints. If you begin to get sore during workout routines, back off. Slow down or do fewer repetitions.  For repetitive activities such as cycling or running, maintain your strength and nutrition.  Alternate muscle groups. For example, if you are a weight lifter, work the upper body on one day and the lower body the next.  Either tight or weak muscles do not give the proper support for your knee. Tight or weak muscles do not absorb the stress placed on the knee joint. Keep the muscles surrounding the knee strong.  Take care of mechanical problems.  If you have flat feet, orthotics or special shoes may help.  See your caregiver if you need help.  Arch supports, sometimes with wedges on the inner or outer aspect of the heel, can help. These can shift pressure away from the side of the knee most bothered by osteoarthritis.  A brace called an "unloader" brace also may be used to help ease the pressure on the most arthritic side of the knee.  If your caregiver has prescribed crutches, braces, wraps or ice, use as directed. The acronym  for this is PRICE. This means protection, rest, ice, compression, and elevation.  Nonsteroidal anti-inflammatory drugs (NSAIDs), can help relieve pain. But if taken immediately after an injury, they may actually increase swelling. Take NSAIDs with food in your stomach. Stop them if you develop stomach problems. Do not take these if you have a history of ulcers, stomach pain, or bleeding from the bowel. Do not take without your caregiver's approval if you have problems with fluid retention, heart failure, or kidney problems.  For ongoing knee problems, physical therapy may be helpful.  Glucosamine and chondroitin are over-the-counter dietary supplements. Both may help relieve the pain of osteoarthritis in the knee. These medicines are different from the usual anti-inflammatory drugs. Glucosamine may decrease the rate of cartilage destruction.  Injections of a corticosteroid drug into your knee joint may help reduce the symptoms of an arthritis flare-up. They may provide pain relief that lasts a few months. You may have to wait a few months between injections. The injections do have a small increased risk of infection, water retention, and elevated blood sugar levels.  Hyaluronic acid injected into damaged joints may ease pain and provide lubrication. These injections may work by reducing inflammation. A series of shots may give relief for as long as 6 months.  Topical painkillers. Applying certain ointments to your skin may help relieve the pain and stiffness of osteoarthritis. Ask your pharmacist for suggestions. Many over the-counter products are approved for temporary relief of arthritis pain.  In some countries, doctors often prescribe topical NSAIDs for relief of chronic conditions such as arthritis and tendinitis. A review of treatment with NSAID creams found that they worked as well as oral medications but without the serious side effects. PREVENTION  Maintain a healthy weight. Extra pounds  put more strain on your joints.  Get strong, stay limber. Weak muscles are a common cause of knee injuries. Stretching is important. Include flexibility exercises in your workouts.  Be smart about exercise. If you have osteoarthritis, chronic knee pain or recurring injuries, you may need to change the way you exercise. This does not mean you have to stop being active. If your knees ache after jogging or playing basketball, consider switching to swimming, water aerobics, or other low-impact activities, at least for a few days a week. Sometimes limiting high-impact activities will provide relief.  Make sure your shoes fit well. Choose footwear that is right for your sport.  Protect your knees. Use the proper gear for knee-sensitive activities. Use kneepads when playing volleyball or laying carpet. Buckle your seat belt every time you drive. Most shattered kneecaps occur in car accidents.  Rest when you are tired. SEEK MEDICAL CARE IF:  You have knee pain that is continual and does not seem to be getting better.  SEEK IMMEDIATE MEDICAL CARE IF:  Your knee joint feels hot to the touch and you have a high fever. MAKE SURE YOU:   Understand these instructions.  Will watch your condition.  Will get help right away if you are not  doing well or get worse. Document Released: 08/15/2007 Document Revised: 01/10/2012 Document Reviewed: 08/15/2007 [facility] Patient Information 2015 [place], [place]. This information is not intended to replace advice given to you by your health care provider. Make sure you discuss any questions you have with your health care provider.  Knee Bracing Knee braces are supports to help stabilize and protect an injured or painful knee. They come in many different styles. They should support and protect the knee without increasing the chance of other injuries to yourself or others. It is important not to have a false sense of security when using a brace. Knee braces that help you  to keep using your knee:  Do not restore normal knee stability under high stress forces.  May decrease some aspects of athletic performance. Some of the different types of knee braces are:  Prophylactic knee braces are designed to prevent or reduce the severity of knee injuries during sports that make injury to the knee more likely.  Rehabilitative knee braces are designed to allow protected motion of:  Injured knees.  Knees that have been treated with or without surgery. There is no evidence that the use of a supportive knee brace protects the graft following a successful anterior cruciate ligament (ACL) reconstruction. However, braces are sometimes used to:   Protect injured ligaments.  Control knee movement during the initial healing period. They may be used as part of the treatment program for the various injured ligaments or cartilage of the knee including the:  Anterior cruciate ligament.  Medial collateral ligament.  Medial or lateral cartilage (meniscus).  Posterior cruciate ligament.  Lateral collateral ligament. Rehabilitative knee braces are most commonly used:  During crutch-assisted walking right after injury.  During crutch-assisted walking right after surgery to repair the cartilage and/or cruciate ligament injury.  For a short period of time, 2-8 weeks, after the injury or surgery. The value of a rehabilitative brace as opposed to a cast or splint includes the:  Ability to adjust the brace for swelling.  Ability to remove the brace for examinations, icing, or showering.  Ability to allow for movement in a controlled range of motion. Functional knee braces give support to knees that have already been injured. They are designed to provide stability for the injured knee and provide protection after repair. Functional knee braces may not affect performance much. Lower extremity muscle strengthening, flexibility, and improvement in technique are more important  than bracing in treating ligamentous knee injuries. Functional braces are not a substitute for rehabilitation or surgical procedures. Unloader/off-loader braces are designed to provide pain relief in arthritic knees. Patients with wear and tear arthritis from growing old or from an old cartilage injury (osteoarthritis) of the knee, and bowlegged (varus) or knock-knee (valgus) deformities, often develop increased pain in the arthritic side due to increased loading. Unloader/off-loader braces are made to reduce uneven loading in such knees. There is reduction in bowing out movement in bowlegged knees when the correct unloader brace is used. Patients with advanced osteoarthritis or severe varus or valgus alignment problems would not likely benefit from bracing. Patellofemoral braces help the kneecap to move smoothly and well centered over the end of the femur in the knee.  Most people who wear knee braces feel that they help. However, there is a lack of scientific evidence that knee braces are helpful at the level needed for athletic participation to prevent injury. In spite of this, athletes report an increase in knee stability, pain relief, performance improvement, and confidence  during athletics when using a brace.  Different knee problems require different knee braces:  Your caregiver may suggest one kind of knee brace after knee surgery.  A caregiver may choose another kind of knee brace for support instead of surgery for some types of torn ligaments.  You may also need one for pain in the front of your knee that is not getting better with strengthening and flexibility exercises. Get your caregiver's advice if you want to try a knee brace. The caregiver will advise you on where to get them and provide a prescription when it is needed to fashion and/or fit the brace. Knee braces are the least important part of preventing knee injuries or getting better following injury. Stretching, strengthening and  technique improvement are far more important in caring for and preventing knee injuries. When strengthening your knee, increase your activities a little at a time so as not to develop injuries from overuse. Work out an exercise plan with your caregiver and/or physical therapist to get the best program for you. Do not let a knee brace become a crutch. Always remember, there are no braces which support the knee as well as your original ligaments and cartilage you were born with. Conditioning, proper warm-up, and stretching remain the most important parts of keeping your knees healthy. HOW TO USE A KNEE BRACE  During sports, knee braces should be used as directed by your caregiver.  Make sure that the hinges are where the knee bends.  Straps, tapes, or hook-and-loop tapes should be fastened around your leg as instructed.  You should check the placement of the brace during activities to make sure that it has not moved. Poorly positioned braces can hurt rather than help you.  To work well, a knee brace should be worn during all activities that put you at risk of knee injury.  Warm up properly before beginning athletic activities. HOME CARE INSTRUCTIONS  Knee braces often get damaged during normal use. Replace worn-out braces for maximum benefit.  Clean regularly with soap and water.  Inspect your brace often for wear and tear.  Cover exposed metal to protect others from injury.  Durable materials may cost more, but last longer. SEEK IMMEDIATE MEDICAL CARE IF:   Your knee seems to be getting worse rather than better.  You have increasing pain or swelling in the knee.  You have problems caused by the knee brace.  You have increased swelling or inflammation (redness or soreness) in your knee.  Your knee becomes warm and more painful and you develop an unexplained temperature over 101F (38.3C). MAKE SURE YOU:   Understand these instructions.  Will watch your condition.  Will get  help right away if you are not doing well or get worse. See your caregiver, physical therapist, or orthopedic surgeon for additional information. Document Released: 01/08/2004 Document Revised: 03/04/2014 Document Reviewed: 04/16/2009 [facility] Patient Information 2015 [place], [place]. This information is not intended to replace advice given to you by your health care provider. Make sure you discuss any questions you have with your health care provider.  RICE: Routine Care for Injuries The routine care of many injuries includes Rest, Ice, Compression, and Elevation (RICE). HOME CARE INSTRUCTIONS  Rest is needed to allow your body to heal. Routine activities can usually be resumed when comfortable. Injured tendons and bones can take up to 6 weeks to heal. Tendons are the cord-like structures that attach muscle to bone.  Ice following an injury helps keep the swelling down  and reduces pain.  Put ice in a plastic bag.  Place a towel between your skin and the bag.  Leave the ice on for 15-20 minutes, 3-4 times a day, or as directed by your health care provider. Do this while awake, for the first 24 to 48 hours. After that, continue as directed by your caregiver.  Compression helps keep swelling down. It also gives support and helps with discomfort. If an elastic bandage has been applied, it should be removed and reapplied every 3 to 4 hours. It should not be applied tightly, but firmly enough to keep swelling down. Watch fingers or toes for swelling, bluish discoloration, coldness, numbness, or excessive pain. If any of these problems occur, remove the bandage and reapply loosely. Contact your caregiver if these problems continue.  Elevation helps reduce swelling and decreases pain. With extremities, such as the arms, hands, legs, and feet, the injured area should be placed near or above the level of the heart, if possible. SEEK IMMEDIATE MEDICAL CARE IF:  You have persistent pain and  swelling.  You develop redness, numbness, or unexpected weakness.  Your symptoms are getting worse rather than improving after several days. These symptoms may indicate that further evaluation or further X-rays are needed. Sometimes, X-rays may not show a small broken bone (fracture) until 1 week or 10 days later. Make a follow-up appointment with your caregiver. Ask when your X-ray results will be ready. Make sure you get your X-ray results. Document Released: 01/30/2001 Document Revised: 10/23/2013 Document Reviewed: 03/19/2011 [facility] Patient Information 2015 [place], [place]. This information is not intended to replace advice given to you by your health care provider. Make sure you discuss any questions you have with your health care provider.

## 2015-04-14 NOTE — ED Provider Notes (Signed)
TIME SEEN: 8:36 PM   CHIEF COMPLAINT: Right knee pain  HPI: HPI Comments: [name] is a 74 y.o. male who presents to the Emergency Department complaining of new right knee pain onset 2 days prior. Pt states that the pain has recently worsened today and is painful to stand and bend then knee.  Pt doesn't report any medications PTA. PT doesn't report injury or fall. Pt denies numbness, tingling or calf pain or swelling.    ROS: See HPI Constitutional: no fever  Eyes: no drainage  ENT: no runny nose   Cardiovascular:  no chest pain  Resp: no SOB  GI: no vomiting GU: no dysuria Integumentary: no rash  Allergy: no hives  Musculoskeletal: no leg swelling  Neurological: no slurred speech ROS otherwise negative  PAST MEDICAL HISTORY/PAST SURGICAL HISTORY:  Past Medical History  Diagnosis Date  . Hypertension   . Coronary artery disease   . C. difficile diarrhea   . Peripheral vascular disease   . Glaucoma   . GERD (gastroesophageal reflux disease)   . Sleep apnea   . 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 Stroke 2008, 2014, 2015  . Diabetes mellitus   . 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 Authorizing Provider  amLODipine (NORVASC) 10 MG tablet Take 10 mg by mouth daily.    Historical Provider, MD  atorvastatin (LIPITOR) 80 MG tablet Take 40 mg by mouth every evening.     Historical Provider, MD  brimonidine (ALPHAGAN) 0.2 % ophthalmic solution Place 1 drop into both eyes 3 (three) times daily.    Historical Provider, MD  dipyridamole-aspirin (AGGRENOX) 200-25 MG per 12 hr capsule Take 1 capsule by mouth 2 (two) times daily.    Historical Provider, MD  dorzolamide-timolol (COSOPT) 22.3-6.8 MG/ML ophthalmic solution Place 1 drop into both eyes 2 (two) times daily.    Historical Provider, MD  furosemide (LASIX) 20 MG tablet Take  1 tablet (20 mg total) by mouth daily. 04/09/15   Alycia Rossetti, MD  HYDROcodone-acetaminophen (NORCO) 5-325 MG per tablet Take 1 tablet by mouth every 6 (six) hours as needed for moderate pain. 04/22/14   Alycia Rossetti, MD  insulin aspart protamine-insulin aspart (NOVOLOG 70/30) (70-30) 100 UNIT/ML injection Inject 40-50 Units into the skin 2 (two) times daily with a meal. Inject 40 units in AM and 45 unit in PM    Historical Provider, MD  latanoprost (XALATAN) 0.005 % ophthalmic solution Place 1 drop into both eyes at bedtime.    Historical Provider, MD  losartan (COZAAR) 100 MG tablet Take 100 mg by mouth daily.      Historical Provider, MD  metFORMIN (GLUCOPHAGE) 1000 MG tablet Take 1,000 mg by mouth 2 (two) times daily with a meal.      Historical Provider, MD  metoprolol (LOPRESSOR) 50 MG tablet Take 0.5 tablets (25 mg total) by mouth 2 (two) times daily. FOR HEART/BLOOD PRESSURE. HOLD FOR SYSTOLIC BLOOD PRESSURE LESS THAN 1OO/HEART RATE LESS THAN 60 05/20/11   Annita Brod, MD  pioglitazone (ACTOS) 30 MG tablet Take 30 mg by mouth daily.     Historical Provider, MD  prednisoLONE acetate (PRED FORTE) 1 % ophthalmic suspension Place 1 drop into the right eye 4 (four) times daily.    Historical Provider, MD  spironolactone (ALDACTONE) 25 MG tablet Take  25 mg by mouth daily.      Historical Provider, MD  tamsulosin (FLOMAX) 0.4 MG CAPS Take 0.4 mg by mouth daily.     Historical Provider, MD    ALLERGIES:  No Known Allergies  SOCIAL HISTORY:  History  Substance Use Topics  . Smoking status: Former Research scientist (life sciences)  . Smokeless tobacco: Never Used  . Alcohol Use: No    FAMILY HISTORY: Family History  Problem Relation Age of Onset  . Diabetes Mother   . Heart failure Mother   . Hypertension Mother   . Diabetes Father   . Heart failure Father   . Hypertension Father   . Diabetes Sister   . Heart failure Sister   . Hypertension Sister   . Hyperlipidemia Sister   . Diabetes Brother    . Heart failure Brother   . Hypertension Brother     EXAM: BP 138/49 mmHg  Pulse 65  Temp(Src) 98.2 F (36.8 C) (Oral)  Resp 16  Ht 5\' 9"  (1.753 m)  Wt 280 lb (127.007 kg)  BMI 41.33 kg/m2  SpO2 99%   CONSTITUTIONAL: Alert and oriented and responds appropriately to questions. Well-appearing; well-nourished HEAD: Normocephalic EYES: Conjunctivae clear, PERRL ENT: normal nose; no rhinorrhea; moist mucous membranes; pharynx without lesions noted NECK: Supple, no meningismus, no LAD  CARD: RRR; S1 and S2 appreciated; no murmurs, no clicks, no rubs, no gallops RESP: Normal chest excursion without splinting or tachypnea; breath sounds clear and equal bilaterally; no wheezes, no rhonchi, no rales, no hypoxia or respiratory distress, speaking full sentences ABD/GI: Normal bowel sounds; non-distended; soft, non-tender, no rebound, no guarding, no peritoneal signs BACK:  The back appears normal and is non-tender to palpation, there is no CVA tenderness EXT: Patient is tender to palpation over the right knee over medial joint line pain with flexion of the knee but has full range of motion, no bony deformity, no erythema or warmth, no joint effusion, no calf tenderness or swelling, 2+ DP and PT pulses bilaterally, normal sensation diffusely, Normal ROM in all joints; otherwise extremities are non-tender to palpation; no edema; normal capillary refill; no cyanosis SKIN: Normal color for age and race; warm NEURO: Moves all extremities equally, sensation to light touch intact diffusely, cranial nerves II through XII intact PSYCH: The patient's mood and manner are appropriate. Grooming and personal hygiene are appropriate.  MEDICAL DECISION MAKING: Patient here with right knee pain. Suspect meniscal injury. No sign of septic arthritis on exam. X-ray shows mild appearing degenerative disease but no other acute abnormality. He has no calf tenderness or swelling on exam. No prior history of DVT.  Neurovascular intact distally. No signs of claudication. We'll discharge him with crutches to use as needed and with Vicodin for pain. Have instructed him to apply ice and elevate his knee. Will give outpatient orthopedic follow-up information. Discussed return precautions. He verbalizes understanding and is comfortable with plan. I do not feel he needs any further emergent workup in the ED.  I personally performed the services described in this documentation, which was scribed in my presence. The recorded information has been reviewed and is accurate.     [place], DO 04/14/15 [id number]

## 2015-04-14 NOTE — ED Notes (Signed)
Pt c/o right leg pain that has gotten progressively worse over the last couple of days; pt states it hurts to walk

## 2015-04-18 ENCOUNTER — Other Ambulatory Visit: Payer: Self-pay | Admitting: Family Medicine

## 2015-04-18 ENCOUNTER — Telehealth: Payer: Self-pay | Admitting: Family Medicine

## 2015-04-18 DIAGNOSIS — Z6841 Body Mass Index (BMI) 40.0 and over, adult: Secondary | ICD-10-CM | POA: Diagnosis not present

## 2015-04-18 DIAGNOSIS — M25561 Pain in right knee: Secondary | ICD-10-CM | POA: Diagnosis not present

## 2015-04-18 DIAGNOSIS — Z720 Tobacco use: Secondary | ICD-10-CM | POA: Diagnosis not present

## 2015-04-18 DIAGNOSIS — M25569 Pain in unspecified knee: Secondary | ICD-10-CM

## 2015-04-18 DIAGNOSIS — R609 Edema, unspecified: Secondary | ICD-10-CM | POA: Diagnosis not present

## 2015-04-18 DIAGNOSIS — I10 Essential (primary) hypertension: Secondary | ICD-10-CM | POA: Diagnosis not present

## 2015-04-18 NOTE — Telephone Encounter (Signed)
Patients wife linda calling to speak with you asap regarding his knee and gout would like a call back quickly if possible  [phone]

## 2015-04-18 NOTE — Telephone Encounter (Signed)
Okay to draw lab

## 2015-04-18 NOTE — Telephone Encounter (Signed)
Returned call to patient wife, [name].   Reports that she is concerned that patient could be having inflammation of L knee due to gout. Reports that patient has never had levels checked.   States that she is taking him to have labs srawn for MD today, and requested gout level to be obtained as well.   Order faxed to [employer].   Of note, patient is scheduled to see Dr Luna Glasgow today for knee pain- referred by ER.

## 2015-04-19 LAB — CBC WITH DIFFERENTIAL/PLATELET
Basophils Absolute: 0 10*3/uL (ref 0.0–0.1)
Basophils Relative: 0 % (ref 0–1)
Eosinophils Absolute: 0.1 10*3/uL (ref 0.0–0.7)
Eosinophils Relative: 1 % (ref 0–5)
HCT: 41.4 % (ref 39.0–52.0)
Hemoglobin: 13.6 g/dL (ref 13.0–17.0)
Lymphocytes Relative: 25 % (ref 12–46)
Lymphs Abs: 1.8 10*3/uL (ref 0.7–4.0)
MCH: 30.5 pg (ref 26.0–34.0)
MCHC: 32.9 g/dL (ref 30.0–36.0)
MCV: 92.8 fL (ref 78.0–100.0)
MPV: 10.3 fL (ref 8.6–12.4)
Monocytes Absolute: 0.7 10*3/uL (ref 0.1–1.0)
Monocytes Relative: 10 % (ref 3–12)
Neutro Abs: 4.6 10*3/uL (ref 1.7–7.7)
Neutrophils Relative %: 64 % (ref 43–77)
Platelets: 245 10*3/uL (ref 150–400)
RBC: 4.46 MIL/uL (ref 4.22–5.81)
RDW: 13.8 % (ref 11.5–15.5)
WBC: 7.2 10*3/uL (ref 4.0–10.5)

## 2015-04-19 LAB — BASIC METABOLIC PANEL
BUN: 18 mg/dL (ref 6–23)
CO2: 23 mEq/L (ref 19–32)
Calcium: 9.1 mg/dL (ref 8.4–10.5)
Chloride: 99 mEq/L (ref 96–112)
Creat: 1.14 mg/dL (ref 0.50–1.35)
Glucose, Bld: 143 mg/dL — ABNORMAL HIGH (ref 70–99)
Potassium: 4.5 mEq/L (ref 3.5–5.3)
Sodium: 137 mEq/L (ref 135–145)

## 2015-04-21 LAB — URIC ACID: Uric Acid, Serum: 7.4 mg/dL (ref 4.0–7.8)

## 2015-04-22 ENCOUNTER — Other Ambulatory Visit (HOSPITAL_COMMUNITY): Payer: Self-pay | Admitting: Orthopaedic Surgery

## 2015-04-22 DIAGNOSIS — M25569 Pain in unspecified knee: Secondary | ICD-10-CM

## 2015-04-23 ENCOUNTER — Ambulatory Visit (HOSPITAL_COMMUNITY)
Admission: RE | Admit: 2015-04-23 | Discharge: 2015-04-23 | Disposition: A | Discharge status: Home / Self Care | Payer: Medicare Other | Source: Ambulatory Visit | Attending: Orthopaedic Surgery | Admitting: Orthopaedic Surgery

## 2015-04-23 DIAGNOSIS — M25561 Pain in right knee: Secondary | ICD-10-CM | POA: Insufficient documentation

## 2015-04-23 DIAGNOSIS — M25569 Pain in unspecified knee: Secondary | ICD-10-CM

## 2015-04-24 DIAGNOSIS — S83281A Other tear of lateral meniscus, current injury, right knee, initial encounter: Secondary | ICD-10-CM | POA: Diagnosis not present

## 2015-04-24 DIAGNOSIS — Z720 Tobacco use: Secondary | ICD-10-CM | POA: Diagnosis not present

## 2015-04-24 DIAGNOSIS — I10 Essential (primary) hypertension: Secondary | ICD-10-CM | POA: Diagnosis not present

## 2015-04-24 DIAGNOSIS — Z6841 Body Mass Index (BMI) 40.0 and over, adult: Secondary | ICD-10-CM | POA: Diagnosis not present

## 2015-04-28 ENCOUNTER — Telehealth: Payer: Self-pay | Admitting: Family Medicine

## 2015-04-28 NOTE — Telephone Encounter (Signed)
Call placed to patient.   No further information could be given other than throat is sore and mouth has irritation covered in yellow looking discharge.   Advised that patient will need to be seen.   Appointment scheduled for 04/29/2015.

## 2015-04-28 NOTE — Telephone Encounter (Signed)
[facility]   PT has a yellow looking rash on his throat and mouth and his throat is sore (no trouble breathing) and would like to have something called in for it

## 2015-04-29 ENCOUNTER — Ambulatory Visit (INDEPENDENT_AMBULATORY_CARE_PROVIDER_SITE_OTHER): Payer: Medicare Other | Admitting: Family Medicine

## 2015-04-29 ENCOUNTER — Encounter: Payer: Self-pay | Admitting: Family Medicine

## 2015-04-29 VITALS — BP 132/62 | HR 66 | Temp 98.4°F | Resp 14

## 2015-04-29 DIAGNOSIS — B370 Candidal stomatitis: Secondary | ICD-10-CM

## 2015-04-29 MED ORDER — NYSTATIN 100000 UNIT/ML MT SUSP
5.0000 mL | Freq: Four times a day (QID) | OROMUCOSAL | Status: DC
Start: 2015-04-29 — End: 2015-10-08

## 2015-04-29 NOTE — Patient Instructions (Signed)
--   Swish and swallow medication use for at least 1 week F/U as previous

## 2015-04-30 NOTE — Progress Notes (Signed)
Patient ID: [name], male   DOB: 1940/12/29, 74 y.o.   MRN: [id number]   Subjective:    Patient ID: [name], male    DOB: 11-Dec-1940, 74 y.o.   MRN: [id number]  Patient presents for Mouth Irritation  for the past 5 days patient has noticed a white thick film on his tongue he has also had some sore throat and pain when he swallows. He denies any fever no recent illness. He is tried brushing his tongue to get off the film but it is still present. He did have an injury to his right knee recently he states that he was trying to exercise on his bike when he began to have more pain. He was seen by orthopedics MRI was done he has a meniscal tear. He is going to the [place] to have surgical intervention. He has an appointment with his primary doctor at the [facility] location next week.   Review Of Systems:  GEN- denies fatigue, fever, weight loss,weakness, recent illness HEENT- denies eye drainage, change in vision, nasal discharge, CVS- denies chest pain, palpitations RESP- denies SOB, cough, wheeze ABD- denies N/V, change in stools, abd pain GU- denies dysuria, hematuria, dribbling, incontinence MSK- + joint pain, muscle aches, injury Neuro- denies headache, dizziness, syncope, seizure activity       Objective:    BP 132/62 mmHg  Pulse 66  Temp(Src) 98.4 F (36.9 C) (Oral)  Resp 14 GEN- NAD, alert and oriented x3 HEENT- PERRL, EOMI, non injected sclera, pink conjunctiva, MMM, post oropharynx clear, no exudates, white plaques film on tongue/ removes some with tongue depresser  Neck- Supple, no LAD CVS- RRR, no murmur RESP-CTAB EXT- pedal edema Pulses- Radial 2+        Assessment & Plan:      Problem List Items Addressed This Visit    None    Visit Diagnoses    Thrush    -  Primary    Nystatin swish and swallow given, no other sign of infection, f/u with ortho for the meniscal tear, blood sugars stable    Relevant Medications    nystatin (MYCOSTATIN) 100000 UNIT/ML  suspension       Note: This dictation was prepared with Dragon dictation along with smaller phrase technology. Any transcriptional errors that result from this process are unintentional.

## 2015-05-26 ENCOUNTER — Ambulatory Visit: Payer: Medicare Other | Admitting: Family Medicine

## 2015-06-24 ENCOUNTER — Encounter: Payer: Self-pay | Admitting: Family Medicine

## 2015-06-24 ENCOUNTER — Ambulatory Visit (INDEPENDENT_AMBULATORY_CARE_PROVIDER_SITE_OTHER): Payer: Medicare Other | Admitting: Family Medicine

## 2015-06-24 VITALS — BP 138/76 | HR 82 | Temp 98.0°F | Resp 16 | Ht 69.0 in | Wt 290.0 lb

## 2015-06-24 DIAGNOSIS — M51369 Other intervertebral disc degeneration, lumbar region without mention of lumbar back pain or lower extremity pain: Secondary | ICD-10-CM

## 2015-06-24 DIAGNOSIS — M5136 Other intervertebral disc degeneration, lumbar region: Secondary | ICD-10-CM

## 2015-06-24 MED ORDER — HYDROCODONE-ACETAMINOPHEN 5-325 MG PO TABS: 1.0000 | ORAL_TABLET | ORAL | Status: DC | PRN

## 2015-06-24 NOTE — Progress Notes (Signed)
Patient ID: [name], male   DOB: [birth year], 74 y.o.   MRN: [id number]   Subjective:    Patient ID: [name], male    DOB: May 17, 1941, 74 y.o.   MRN: [id number]  Patient presents for Low Back Pain  patient here with bilateral lower back pain for the past month or so. He states that it is worse in the morning when he first gets up he feels very stiff when he takes a pain pill which is hydrocodone and this relieves his pain. He denies any radiating symptoms denies any recent falls or injuries. He denies any change in his bowel bladder. He was seen by orthopedics secondary to his right knee injury a decided not to intervene with arthroscopy at this time    Review Of Systems:  GEN- denies fatigue, fever, weight loss,weakness, recent illness HEENT- denies eye drainage, change in vision, nasal discharge, CVS- denies chest pain, palpitations RESP- denies SOB, cough, wheeze ABD- denies N/V, change in stools, abd pain GU- denies dysuria, hematuria, dribbling, incontinence MSK- +joint pain, muscle aches, injury Neuro- denies headache, dizziness, syncope, seizure activity       Objective:    BP 138/76 mmHg  Pulse 82  Temp(Src) 98 F (36.7 C) (Oral)  Resp 16  Ht 5\' 9"  (1.753 m)  Wt 290 lb (131.543 kg)  BMI 42.81 kg/m2 GEN- NAD, alert and oriented x3,weight down 13lbs HEENT- PERRL, EOMI, non injected sclera, pink conjunctiva, MMM, oropharynx clear Neck- Supple, no thyromegaly CVS- RRR, no murmur RESP-CTAB MSK- Spine NT,mild TTP bilat paraspinals, no spasm, fair ROM, neg SLR, fair ROM bilat HIPS/KNEES Non antalgic gait         Assessment & Plan:      Problem List Items Addressed This Visit    None    Visit Diagnoses    DDD (degenerative disc disease), lumbar    -  Primary    No red flags, he has been intentionally loosing weight, likley DDD, hold on imaging, Tylenol arthritis, if severe hydrocodone, but want to avoid due to habituation with this    Relevant Medications     HYDROcodone-acetaminophen (NORCO/VICODIN) 5-325 MG per tablet       Note: This dictation was prepared with Dragon dictation along with smaller phrase technology. Any transcriptional errors that result from this process are unintentional.

## 2015-06-24 NOTE — Patient Instructions (Signed)
Try tylenol arthritis  Take hydrocodone if severe Stay active F/U as previous

## 2015-07-11 ENCOUNTER — Encounter: Payer: Self-pay | Admitting: Family Medicine

## 2015-07-11 ENCOUNTER — Ambulatory Visit (INDEPENDENT_AMBULATORY_CARE_PROVIDER_SITE_OTHER): Payer: Medicare Other | Admitting: Family Medicine

## 2015-07-11 VITALS — BP 140/74 | HR 68 | Temp 97.9°F | Resp 16 | Ht 69.0 in | Wt 293.0 lb

## 2015-07-11 DIAGNOSIS — E1129 Type 2 diabetes mellitus with other diabetic kidney complication: Secondary | ICD-10-CM

## 2015-07-11 DIAGNOSIS — Z23 Encounter for immunization: Secondary | ICD-10-CM | POA: Diagnosis not present

## 2015-07-11 DIAGNOSIS — E669 Obesity, unspecified: Secondary | ICD-10-CM | POA: Diagnosis not present

## 2015-07-11 DIAGNOSIS — I10 Essential (primary) hypertension: Secondary | ICD-10-CM

## 2015-07-11 DIAGNOSIS — N058 Unspecified nephritic syndrome with other morphologic changes: Secondary | ICD-10-CM | POA: Diagnosis not present

## 2015-07-11 DIAGNOSIS — N183 Chronic kidney disease, stage 3 unspecified: Secondary | ICD-10-CM

## 2015-07-11 DIAGNOSIS — I2510 Atherosclerotic heart disease of native coronary artery without angina pectoris: Secondary | ICD-10-CM

## 2015-07-11 LAB — HEMOGLOBIN A1C
Hgb A1c MFr Bld: 7.3 % — ABNORMAL HIGH (ref <5.7)
Mean Plasma Glucose: 163 mg/dL — ABNORMAL HIGH (ref <117)

## 2015-07-11 LAB — BASIC METABOLIC PANEL
BUN: 13 mg/dL (ref 7–25)
CO2: 22 mmol/L (ref 20–31)
Calcium: 8.8 mg/dL (ref 8.6–10.3)
Chloride: 106 mmol/L (ref 98–110)
Creat: 1.05 mg/dL (ref 0.70–1.18)
Glucose, Bld: 133 mg/dL — ABNORMAL HIGH (ref 70–99)
Potassium: 4.2 mmol/L (ref 3.5–5.3)
Sodium: 139 mmol/L (ref 135–146)

## 2015-07-11 NOTE — Assessment & Plan Note (Signed)
Blood pressure well controlled his leg swelling is also controlled with current medications when he is eating properly he does significantly much better

## 2015-07-11 NOTE — Patient Instructions (Signed)
Continue current medications  F/U 4 months

## 2015-07-11 NOTE — Assessment & Plan Note (Signed)
Recheck A1c to turn the current dose of NovoLog and metformin

## 2015-07-11 NOTE — Progress Notes (Signed)
Patient ID: [name], male   DOB: 1941/07/24, 74 y.o.   MRN: [id number]   Subjective:    Patient ID: [name], male    DOB: [birth year], 74 y.o.   MRN: [id number]  Patient presents for 3 month F/U and Immunization   Pt here to f/u chronic medical problems: DM- last A1C 7.4%, giving Novolog 70/30- 43 in AM, 50 units in pm    And taking MTF as prescribed, CBG range <150 fasting    - no hypoglycemia  Taking all other medications as prescribed.  He has been drinking more sweet tea and eating a the buffet recently. No new concerns Due for flu shot Medications reviewed    Review Of Systems:  GEN- denies fatigue, fever, weight loss,weakness, recent illness HEENT- denies eye drainage, change in vision, nasal discharge, CVS- denies chest pain, palpitations RESP- denies SOB, cough, wheeze ABD- denies N/V, change in stools, abd pain GU- denies dysuria, hematuria, dribbling, incontinence MSK- denies joint pain, muscle aches, injury Neuro- denies headache, dizziness, syncope, seizure activity       Objective:    BP 140/74 mmHg  Pulse 68  Temp(Src) 97.9 F (36.6 C) (Oral)  Resp 16  Ht 5\' 9"  (1.753 m)  Wt 293 lb (132.904 kg)  BMI 43.25 kg/m2 GEN- NAD, alert and oriented x3 HEENT- PERRL, EOMI, non injected sclera, pink conjunctiva, MMM, oropharynx clear Neck- Supple, no thyromegaly CVS- RRR, no murmur RESP-CTAB ABD-NABS,soft,NT,ND EXT- No edema Pulses- Radial, DP- 2+        Assessment & Plan:      Problem List Items Addressed This Visit    Obesity     He has gained 3 pounds continue to  reiterate the importance of following low sugar diet he's been drinking a lot of sweet tea and eating out more recently      HTN (hypertension) (Chronic)     Blood pressure well controlled his leg swelling is also controlled with current medications when he is eating properly he does significantly much better      DM (diabetes mellitus) type II controlled with renal manifestation -  Primary     Recheck A1c to turn the current dose of NovoLog and metformin      Relevant Orders   Basic metabolic panel   Hemoglobin A1c   Chronic kidney disease   CAD (coronary artery disease)    Other Visit Diagnoses    Need for prophylactic vaccination and inoculation against influenza        Relevant Orders    Flu Vaccine QUAD 36+ mos PF IM (Fluarix & Fluzone Quad PF) (Completed)       Note: This dictation was prepared with Dragon dictation along with smaller Company secretary. Any transcriptional errors that result from this process are unintentional.

## 2015-07-11 NOTE — Assessment & Plan Note (Signed)
He has gained 3 pounds continue to  reiterate the importance of following low sugar diet he's been drinking a lot of sweet tea and eating out more recently

## 2015-10-08 ENCOUNTER — Emergency Department (HOSPITAL_COMMUNITY): Payer: 59

## 2015-10-08 ENCOUNTER — Emergency Department (HOSPITAL_COMMUNITY)
Admission: EM | Admit: 2015-10-08 | Discharge: 2015-10-09 | Disposition: A | Discharge status: Home / Self Care | Payer: 59 | Attending: Emergency Medicine | Admitting: Emergency Medicine

## 2015-10-08 ENCOUNTER — Encounter (HOSPITAL_COMMUNITY): Payer: Self-pay | Admitting: *Deleted

## 2015-10-08 DIAGNOSIS — Z794 Long term (current) use of insulin: Secondary | ICD-10-CM | POA: Diagnosis not present

## 2015-10-08 DIAGNOSIS — R05 Cough: Secondary | ICD-10-CM | POA: Diagnosis not present

## 2015-10-08 DIAGNOSIS — I2510 Atherosclerotic heart disease of native coronary artery without angina pectoris: Secondary | ICD-10-CM | POA: Insufficient documentation

## 2015-10-08 DIAGNOSIS — Z87891 Personal history of nicotine dependence: Secondary | ICD-10-CM | POA: Diagnosis not present

## 2015-10-08 DIAGNOSIS — H409 Unspecified glaucoma: Secondary | ICD-10-CM | POA: Insufficient documentation

## 2015-10-08 DIAGNOSIS — Z7984 Long term (current) use of oral hypoglycemic drugs: Secondary | ICD-10-CM | POA: Insufficient documentation

## 2015-10-08 DIAGNOSIS — Z79899 Other long term (current) drug therapy: Secondary | ICD-10-CM | POA: Insufficient documentation

## 2015-10-08 DIAGNOSIS — R0602 Shortness of breath: Secondary | ICD-10-CM | POA: Diagnosis not present

## 2015-10-08 DIAGNOSIS — Z8673 Personal history of transient ischemic attack (TIA), and cerebral infarction without residual deficits: Secondary | ICD-10-CM | POA: Insufficient documentation

## 2015-10-08 DIAGNOSIS — Z951 Presence of aortocoronary bypass graft: Secondary | ICD-10-CM | POA: Insufficient documentation

## 2015-10-08 DIAGNOSIS — N181 Chronic kidney disease, stage 1: Secondary | ICD-10-CM | POA: Diagnosis not present

## 2015-10-08 DIAGNOSIS — E119 Type 2 diabetes mellitus without complications: Secondary | ICD-10-CM | POA: Insufficient documentation

## 2015-10-08 DIAGNOSIS — Z8719 Personal history of other diseases of the digestive system: Secondary | ICD-10-CM | POA: Diagnosis not present

## 2015-10-08 DIAGNOSIS — I129 Hypertensive chronic kidney disease with stage 1 through stage 4 chronic kidney disease, or unspecified chronic kidney disease: Secondary | ICD-10-CM | POA: Insufficient documentation

## 2015-10-08 DIAGNOSIS — Z8619 Personal history of other infectious and parasitic diseases: Secondary | ICD-10-CM | POA: Diagnosis not present

## 2015-10-08 DIAGNOSIS — R062 Wheezing: Secondary | ICD-10-CM | POA: Insufficient documentation

## 2015-10-08 LAB — COMPREHENSIVE METABOLIC PANEL
ALT: 17 U/L (ref 17–63)
AST: 19 U/L (ref 15–41)
Albumin: 4 g/dL (ref 3.5–5.0)
Alkaline Phosphatase: 45 U/L (ref 38–126)
Anion gap: 10 (ref 5–15)
BUN: 17 mg/dL (ref 6–20)
CO2: 26 mmol/L (ref 22–32)
Calcium: 9.1 mg/dL (ref 8.9–10.3)
Chloride: 105 mmol/L (ref 101–111)
Creatinine, Ser: 1.1 mg/dL (ref 0.61–1.24)
GFR calc Af Amer: >60 mL/min (ref >60)
GFR calc non Af Amer: >60 mL/min (ref >60)
Glucose, Bld: 66 mg/dL (ref 65–99)
Potassium: 3.8 mmol/L (ref 3.5–5.1)
Sodium: 141 mmol/L (ref 135–145)
Total Bilirubin: 0.6 mg/dL (ref 0.3–1.2)
Total Protein: 8 g/dL (ref 6.5–8.1)

## 2015-10-08 LAB — CBC WITH DIFFERENTIAL/PLATELET
Basophils Absolute: 0 10*3/uL (ref 0.0–0.1)
Basophils Relative: 0 %
Eosinophils Absolute: 0.1 10*3/uL (ref 0.0–0.7)
Eosinophils Relative: 1 %
HCT: 41.8 % (ref 39.0–52.0)
Hemoglobin: 13.4 g/dL (ref 13.0–17.0)
Lymphocytes Relative: 38 %
Lymphs Abs: 2.7 10*3/uL (ref 0.7–4.0)
MCH: 31.3 pg (ref 26.0–34.0)
MCHC: 32.1 g/dL (ref 30.0–36.0)
MCV: 97.7 fL (ref 78.0–100.0)
Monocytes Absolute: 0.6 10*3/uL (ref 0.1–1.0)
Monocytes Relative: 9 %
Neutro Abs: 3.8 10*3/uL (ref 1.7–7.7)
Neutrophils Relative %: 52 %
Platelets: 239 10*3/uL (ref 150–400)
RBC: 4.28 MIL/uL (ref 4.22–5.81)
RDW: 13.4 % (ref 11.5–15.5)
WBC: 7.3 10*3/uL (ref 4.0–10.5)

## 2015-10-08 LAB — BRAIN NATRIURETIC PEPTIDE: B Natriuretic Peptide: 153 pg/mL — ABNORMAL HIGH (ref 0.0–100.0)

## 2015-10-08 NOTE — ED Provider Notes (Signed)
By signing my name below, I, Soijett Blue, attest that this documentation has been prepared under the direction and in the presence of [employer], DO. Electronically Signed: Soijett Blue, ED Scribe. 10/08/2015. 12:22 AM.  TIME SEEN: 12:07 AM  CHIEF COMPLAINT: SOB  HPI: [name] is a 74 y.o. male with a medical hx of HTN, CAD, CKD, peripheral vascular dx, stroke, DM who presents to the Emergency Department complaining of mild exertional SOB onset 2 days. He reports that he feels better after being in the ED. Pt is having associated symptoms of mild wheezing. He notes that he has not tried any medications for the relief of his symptoms. He denies CP and any other symptoms. Denies PMHx of asthma or COPD. He states that he stopped smoking cigarettes in 1980. He notes that he has had open heart surgery in the past for a bypass in 2007. He states that he has had chronic swelling in his left leg intermittently since his open heart bypass surgery in 2007. He reports that he takes anti-diuretics. Denies hx of blood clots in legs or lungs. Pt PCP is at [place]. He states that he takes aggrenox. Denies any chest pain. Denies fever or cough. Denies vomiting or diarrhea. No diaphoresis.  PCP. Dr. Vic Blackbird  ROS: See HPI Constitutional: no fever  Eyes: no drainage  ENT: no runny nose   Cardiovascular:  no chest pain  Resp:  SOB  GI: no vomiting GU: no dysuria Integumentary: no rash  Allergy: no hives  Musculoskeletal: no leg swelling  Neurological: no slurred speech ROS otherwise negative  PAST MEDICAL HISTORY/PAST SURGICAL HISTORY:  Past Medical History  Diagnosis Date  . Hypertension   . Coronary artery disease   . C. difficile diarrhea   . Peripheral vascular disease ([place])   . Glaucoma   . GERD (gastroesophageal reflux disease)   . Sleep apnea   . 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 Stroke [facility]) 2008, 2014, 2015  . Diabetes mellitus   . Diabetes  mellitus with stage 1 chronic kidney disease ([place]) 05/20/2011    MEDICATIONS:  Prior to Admission medications   Medication Sig Start Date End Date Taking? Authorizing Provider  amLODipine (NORVASC) 10 MG tablet Take 10 mg by mouth daily.   Yes Historical Provider, MD  atorvastatin (LIPITOR) 80 MG tablet Take 40 mg by mouth every evening.    Yes Historical Provider, MD  brimonidine (ALPHAGAN) 0.2 % ophthalmic solution Place 1 drop into both eyes 3 (three) times daily.   Yes Historical Provider, MD  dipyridamole-aspirin (AGGRENOX) 200-25 MG per 12 hr capsule Take 1 capsule by mouth 2 (two) times daily.   Yes Historical Provider, MD  dorzolamide-timolol (COSOPT) 22.3-6.8 MG/ML ophthalmic solution Place 1 drop into both eyes 2 (two) times daily.   Yes Historical Provider, MD  furosemide (LASIX) 20 MG tablet Take 1 tablet (20 mg total) by mouth daily. 04/09/15  Yes Alycia Rossetti, MD  HYDROcodone-acetaminophen (NORCO/VICODIN) 5-325 MG per tablet Take 1 tablet by mouth every 4 (four) hours as needed. Patient not taking: Reported on 10/08/2015 06/24/15   Alycia Rossetti, MD  insulin aspart protamine-insulin aspart (NOVOLOG 70/30) (70-30) 100 UNIT/ML injection Inject 45-50 Units into the skin 2 (two) times daily with a meal. Inject 45 units in AM and 50 unit in PM   Yes Historical Provider, MD  latanoprost (XALATAN) 0.005 % ophthalmic solution Place 1 drop into both eyes  at bedtime.   Yes Historical Provider, MD  losartan (COZAAR) 100 MG tablet Take 100 mg by mouth daily.     Yes Historical Provider, MD  metFORMIN (GLUCOPHAGE) 1000 MG tablet Take 1,000 mg by mouth 2 (two) times daily with a meal.     Yes Historical Provider, MD  metoprolol (LOPRESSOR) 50 MG tablet Take 0.5 tablets (25 mg total) by mouth 2 (two) times daily. FOR HEART/BLOOD PRESSURE. HOLD FOR SYSTOLIC BLOOD PRESSURE LESS THAN 1OO/HEART RATE LESS THAN 60 05/20/11  Yes Annita Brod, MD  pioglitazone (ACTOS) 30 MG tablet Take 30 mg by mouth  daily.    Yes Historical Provider, MD  tamsulosin (FLOMAX) 0.4 MG CAPS Take 0.4 mg by mouth every evening.    Yes Historical Provider, MD    ALLERGIES:  No Known Allergies  SOCIAL HISTORY:  Social History  Substance Use Topics  . Smoking status: Former Research scientist (life sciences)  . Smokeless tobacco: Never Used  . Alcohol Use: No    FAMILY HISTORY: Family History  Problem Relation Age of Onset  . Diabetes Mother   . Heart failure Mother   . Hypertension Mother   . Diabetes Father   . Heart failure Father   . Hypertension Father   . Diabetes Sister   . Heart failure Sister   . Hypertension Sister   . Hyperlipidemia Sister   . Diabetes Brother   . Heart failure Brother   . Hypertension Brother     EXAM: BP 156/57 mmHg  Pulse 71  Temp(Src) 98.1 F (36.7 C) (Oral)  Resp 18  Ht 5\' 8"  (1.727 m)  Wt 285 lb (129.275 kg)  BMI 43.34 kg/m2  SpO2 100% CONSTITUTIONAL: Alert and oriented and responds appropriately to questions. Well-appearing; well-nourished, elderly  HEAD: Normocephalic EYES: Conjunctivae clear, PERRL ENT: normal nose; no rhinorrhea; moist mucous membranes; pharynx without lesions noted NECK: Supple, no meningismus, no LAD  CARD: RRR; S1 and S2 appreciated; no murmurs, no clicks, no rubs, no gallops RESP: Normal chest excursion without splinting or tachypnea; breath sounds clear and equal bilaterally; no wheezes, no rhonchi, no rales, no hypoxia or respiratory distress, speaking full sentences ABD/GI: Normal bowel sounds; non-distended; soft, non-tender, no rebound, no guarding, no peritoneal signs BACK:  The back appears normal and is non-tender to palpation, there is no CVA tenderness EXT: Normal ROM in all joints; non-tender to palpation;  slightly greater size of the left lower extremity compared to the rightnormal capillary refill; no cyanosis, no calf tenderness or swelling    SKIN: Normal color for age and race; warm NEURO: Moves all extremities equally, sensation to  light touch intact diffusely, cranial nerves II through XII intact PSYCH: The patient's mood and manner are appropriate. Grooming and personal hygiene are appropriate.  MEDICAL DECISION MAKING:  Patient here with reported shortness of breath and wheezing for the past 2 days. He states however while in the emergency department he is feeling much better. His lungs are clear to auscultation. He is not having any chest pain, nausea or vomiting, dizziness or diaphoresis. EKG shows no new ischemic changes. Troponin is negative. He does not appear volume overloaded. BNP is normal. He has chronic swelling in his left lower extremity since 2007 after he had his saphenous vein removed for CABG. Chest x-ray is clear. He has been able to ambulate and his oxygen saturation stays at 97% or higher. I recommend close follow-up with his outpatient provider Dr. Buelah Manis. Discussed with patient that this could be his  anginal equivalent but he states previously he has had chest pain with his CABG. Given he is feeling much better at this time do not feel he needs admission and given his troponin is negative after 2 days of symptoms I do not feel he needs serial enzymes. I have discussed with him that if his symptoms return I recommend he return to the hospital. He has no history of PE or DVT. He does have slightly increased size in the left leg compared to the right but no calf tenderness and he states this is chronic. Given he is not tachypneic, tachycardic, hypoxic and states he is feeling better I have low suspicion for clinically significant pulmonary embolus. Discussed at length return precautions with patient and his wife. They verbalize understanding and are comfortable with this plan.    EKG Interpretation  Date/Time:  Wednesday October 08 2015 21:23:51 EST Ventricular Rate:  67 PR Interval:  202 QRS Duration: 90 QT Interval:  564 QTC Calculation: 595 R Axis:   -63 Text Interpretation:  Sinus rhythm Inferior  infarct, old Consider anterior infarct Lateral leads are also involved Prolonged QT interval Nonspecific T wave abnormality No significant change since last tracing Confirmed by Kathrynn Humble, MD, Thelma Comp [phone]) on 10/08/2015 10:20:08 PM        I personally performed the services described in this documentation, which was scribed in my presence. The recorded information has been reviewed and is accurate.   [place], DO 10/09/15 [phone]

## 2015-10-08 NOTE — ED Notes (Signed)
Pt c/o sob and wheezing x 2 days.

## 2015-10-09 LAB — TROPONIN I: Troponin I: <0.03 ng/mL (ref <0.031)

## 2015-10-09 NOTE — ED Notes (Signed)
Patient ambulated around nurses station, HR 66 up to 105, O2-Sats-97 % whole duration of ambulating, patient back in room HR-66.

## 2015-10-09 NOTE — Discharge Instructions (Signed)

## 2015-10-10 ENCOUNTER — Encounter: Payer: Self-pay | Admitting: Family Medicine

## 2015-10-10 ENCOUNTER — Ambulatory Visit (INDEPENDENT_AMBULATORY_CARE_PROVIDER_SITE_OTHER): Payer: Medicare Other | Admitting: Family Medicine

## 2015-10-10 VITALS — BP 140/68 | HR 60 | Temp 98.4°F | Resp 20 | Ht 69.0 in | Wt 302.0 lb

## 2015-10-10 DIAGNOSIS — R0602 Shortness of breath: Secondary | ICD-10-CM

## 2015-10-10 DIAGNOSIS — E8770 Fluid overload, unspecified: Secondary | ICD-10-CM

## 2015-10-10 MED ORDER — FUROSEMIDE 40 MG PO TABS: 40.0000 mg | ORAL_TABLET | Freq: Every day | ORAL | Status: DC

## 2015-10-10 NOTE — Patient Instructions (Signed)
Take lasix 40mg  once a day F/U 1 week for recheck on weight

## 2015-10-10 NOTE — Progress Notes (Signed)
Patient ID: [name], male   DOB: 10-Aug-1941, 74 y.o.   MRN: [id number]   Subjective:    Patient ID: [name], male    DOB: [birth year], 74 y.o.   MRN: [id number]  Patient presents for ER F/U  patient here for follow-up from the emergency room. He was seen in the emergency room 2 days ago secondary to shortness of breath. EKG showed mild prolonged QT at 500 otherwise no new changes. chest x-ray was negative. Per the notes his shortness of breath resolved when he was in the emergency room therefore there was no specific intervention. He denied any chest pain at the time. His BNP was mildly elevated at 153 but has been significantly higher in the past. He states that he feels well today. He has not had any recent illness. He still gets short of breath however when he is exerting himself. I do not have any recent ED echo on file. Most of his cardiac workup was done at the [facility]. He states that he thinks it is fluid in that he started taking Lasix 20 mg yesterday.   Review Of Systems:  GEN- denies fatigue, fever, weight loss,weakness, recent illness HEENT- denies eye drainage, change in vision, nasal discharge, CVS- denies chest pain, palpitations RESP- + SOB, cough, wheeze ABD- denies N/V, change in stools, abd pain GU- denies dysuria, hematuria, dribbling, incontinence MSK- denies joint pain, muscle aches, injury Neuro- denies headache, dizziness, syncope, seizure activity       Objective:    BP 140/68 mmHg  Pulse 60  Temp(Src) 98.4 F (36.9 C) (Oral)  Resp 20  Ht 5\' 9"  (1.753 m)  Wt 302 lb (136.986 kg)  BMI 44.58 kg/m2  SpO2 99% GEN- NAD, alert and oriented x3 HEENT- PERRL, EOMI, non injected sclera, pink conjunctiva, MMM, oropharynx clear Neck- Supple, JVD 4cm CVS- RRR, no murmur RESP-CTAB, s/p 2 minute walk occasional wheeze, increased WOB  ABD-NABS,soft,NT,ND EXT- 1+ edema,wearing compression socks Pulses- Radial +  EKG- NSR, old infarct, QT  normal      Assessment & Plan:      Problem List Items Addressed This Visit    None    Visit Diagnoses    SOB (shortness of breath)    -  Primary    Initially prolonged QT which he has had in past this resolved on EKG recheck, however, sats dropped to about 94% and weight up 10lbs which I dont think is all limited. He is volume overloaded though. I'm going to increase his Lasix to 40 mg once a day. He will have a recheck in 1 week. I will also recheck his metabolic panel. Will then obtain a 2-D echo before that visit. He has not been following the prescribed diet also reiterated low sodium to him.     Relevant Orders    EKG 12-Lead (Completed)    Echocardiogram    Hypervolemia, unspecified hypervolemia type           Note: This dictation was prepared with Dragon dictation along with smaller phrase technology. Any transcriptional errors that result from this process are unintentional.

## 2015-10-13 ENCOUNTER — Ambulatory Visit (HOSPITAL_COMMUNITY)
Admission: RE | Admit: 2015-10-13 | Discharge: 2015-10-13 | Disposition: A | Discharge status: Home / Self Care | Payer: Medicare Other | Source: Ambulatory Visit | Attending: Family Medicine | Admitting: Family Medicine

## 2015-10-13 DIAGNOSIS — I340 Nonrheumatic mitral (valve) insufficiency: Secondary | ICD-10-CM | POA: Insufficient documentation

## 2015-10-13 DIAGNOSIS — R0600 Dyspnea, unspecified: Secondary | ICD-10-CM | POA: Insufficient documentation

## 2015-10-13 DIAGNOSIS — R0602 Shortness of breath: Secondary | ICD-10-CM

## 2015-10-13 DIAGNOSIS — E119 Type 2 diabetes mellitus without complications: Secondary | ICD-10-CM | POA: Insufficient documentation

## 2015-10-13 DIAGNOSIS — I10 Essential (primary) hypertension: Secondary | ICD-10-CM | POA: Diagnosis not present

## 2015-10-13 DIAGNOSIS — I2510 Atherosclerotic heart disease of native coronary artery without angina pectoris: Secondary | ICD-10-CM | POA: Diagnosis not present

## 2015-10-13 DIAGNOSIS — E669 Obesity, unspecified: Secondary | ICD-10-CM | POA: Insufficient documentation

## 2015-10-13 DIAGNOSIS — Z6841 Body Mass Index (BMI) 40.0 and over, adult: Secondary | ICD-10-CM | POA: Diagnosis not present

## 2015-10-13 DIAGNOSIS — Z8673 Personal history of transient ischemic attack (TIA), and cerebral infarction without residual deficits: Secondary | ICD-10-CM | POA: Insufficient documentation

## 2015-10-13 DIAGNOSIS — G4733 Obstructive sleep apnea (adult) (pediatric): Secondary | ICD-10-CM | POA: Diagnosis not present

## 2015-10-13 DIAGNOSIS — E785 Hyperlipidemia, unspecified: Secondary | ICD-10-CM | POA: Insufficient documentation

## 2015-10-13 DIAGNOSIS — I739 Peripheral vascular disease, unspecified: Secondary | ICD-10-CM | POA: Insufficient documentation

## 2015-10-13 MED ORDER — PERFLUTREN LIPID MICROSPHERE
1.0000 mL | INTRAVENOUS | Status: AC | PRN
  Administered 2015-10-13: 2 mL via INTRAVENOUS
  Administered 2015-10-13: 1 mL via INTRAVENOUS
  Filled 2015-10-13: qty 10

## 2015-10-20 ENCOUNTER — Encounter: Payer: Self-pay | Admitting: Family Medicine

## 2015-10-20 ENCOUNTER — Ambulatory Visit (INDEPENDENT_AMBULATORY_CARE_PROVIDER_SITE_OTHER): Payer: Medicare Other | Admitting: Family Medicine

## 2015-10-20 VITALS — BP 138/78 | HR 62 | Temp 97.9°F | Resp 18 | Wt 298.0 lb

## 2015-10-20 DIAGNOSIS — N183 Chronic kidney disease, stage 3 unspecified: Secondary | ICD-10-CM

## 2015-10-20 DIAGNOSIS — I5032 Chronic diastolic (congestive) heart failure: Secondary | ICD-10-CM

## 2015-10-20 DIAGNOSIS — J069 Acute upper respiratory infection, unspecified: Secondary | ICD-10-CM

## 2015-10-20 LAB — BASIC METABOLIC PANEL
BUN: 17 mg/dL (ref 7–25)
CO2: 25 mmol/L (ref 20–31)
Calcium: 8.8 mg/dL (ref 8.6–10.3)
Chloride: 103 mmol/L (ref 98–110)
Creat: 1.08 mg/dL (ref 0.70–1.18)
Glucose, Bld: 97 mg/dL (ref 70–99)
Potassium: 4.6 mmol/L (ref 3.5–5.3)
Sodium: 136 mmol/L (ref 135–146)

## 2015-10-20 MED ORDER — AZITHROMYCIN 250 MG PO TABS: ORAL_TABLET | ORAL | Status: DC

## 2015-10-20 NOTE — Assessment & Plan Note (Addendum)
Systolic function preserved, continue lasix 40mg  Check renal function Symptoms improved

## 2015-10-20 NOTE — Patient Instructions (Signed)
Continue the lasix 40mg  once a day  Ice your knee and elevate your knee  Robitussin DM and antibiotic Change F/U to Feb

## 2015-10-20 NOTE — Progress Notes (Signed)
Patient ID: [name], male   DOB: 1941/04/03, 74 y.o.   MRN: [id number]   Subjective:    Patient ID: [name], male    DOB: [birth year], 74 y.o.   MRN: [id number]  Patient presents for Follow-up and OTHER  patient here with cough with production worsening over the past week he has not had any fever is cough is now  Productive of thick green sputum, taking Robitussin DM. Occ hears wheeze, but clears with cough    Here for f/u on fluid retention, Lasix increased to 40mg  once a day, weight down 4lbs, feels less SOB. Echo obtained shows LVH and diastolic heart failure.  He fell Sat, getting out of car, right knee gave out on him, has appt to see therapist for brace fitting for right knee tomorrow. Denies any significant pain, states swelling about the same as usual    Review Of Systems:  GEN- denies fatigue, fever, weight loss,weakness, recent illness HEENT- denies eye drainage, change in vision, nasal discharge, CVS- denies chest pain, palpitations RESP- denies SOB, +cough, +wheeze ABD- denies N/V, change in stools, abd pain GU- denies dysuria, hematuria, dribbling, incontinence MSK-+ joint pain, muscle aches, injury Neuro- denies headache, dizziness, syncope, seizure activity       Objective:    BP 138/78 mmHg  Pulse 62  Temp(Src) 97.9 F (36.6 C) (Oral)  Resp 18  Wt 298 lb (135.172 kg) GEN- NAD, alert and oriented x3 HEENT- PERRL, EOMI, non injected sclera, pink conjunctiva, MMM, oropharynx clear CVS- RRR, no murmur RESP-mild rhonchi, otherwise clear, no wheeze, normal WOB  EXT- pedal wearing compression socks Pulses- Radial +      Assessment & Plan:      Problem List Items Addressed This Visit    Chronic kidney disease   Relevant Orders   Basic Metabolic Panel   Chronic diastolic heart failure (HCC)    Systolic function preserved, continue lasix 40mg  Check renal function Symptoms improved       Other Visit Diagnoses    Acute URI    -  Primary    zpak,  continue Robitussin DM    Relevant Medications    azithromycin (ZITHROMAX) 250 MG tablet       Note: This dictation was prepared with Dragon dictation along with smaller phrase technology. Any transcriptional errors that result from this process are unintentional.

## 2015-10-21 ENCOUNTER — Encounter: Payer: Self-pay | Admitting: *Deleted

## 2015-11-10 ENCOUNTER — Ambulatory Visit: Payer: Medicare Other | Admitting: Family Medicine

## 2015-12-10 ENCOUNTER — Ambulatory Visit (INDEPENDENT_AMBULATORY_CARE_PROVIDER_SITE_OTHER): Payer: PPO | Admitting: Family Medicine

## 2015-12-10 ENCOUNTER — Encounter: Payer: Self-pay | Admitting: Family Medicine

## 2015-12-10 VITALS — BP 152/68 | HR 78 | Temp 99.6°F | Resp 22 | Wt 303.0 lb

## 2015-12-10 DIAGNOSIS — J209 Acute bronchitis, unspecified: Secondary | ICD-10-CM

## 2015-12-10 MED ORDER — LEVOFLOXACIN 500 MG PO TABS: 500.0000 mg | ORAL_TABLET | Freq: Every day | ORAL | Status: DC

## 2015-12-10 MED ORDER — PREDNISONE 20 MG PO TABS: 40.0000 mg | ORAL_TABLET | Freq: Every day | ORAL | Status: DC

## 2015-12-10 MED ORDER — METHYLPREDNISOLONE ACETATE 40 MG/ML IJ SUSP
40.0000 mg | Freq: Once | INTRAMUSCULAR | Status: AC
  Administered 2015-12-10: 40 mg via INTRAMUSCULAR

## 2015-12-10 MED ORDER — ALBUTEROL SULFATE HFA 108 (90 BASE) MCG/ACT IN AERS: 2.0000 | INHALATION_SPRAY | Freq: Four times a day (QID) | RESPIRATORY_TRACT | Status: DC | PRN

## 2015-12-10 NOTE — Patient Instructions (Addendum)
Stop the alka seltzer,  Take prednisone - starting tomorrow  Start , antibiotics, Use albuterol inhaler every 4 hours as needed Use Coricidan cough medicine  Go up on insulin `1 unit if blood sugar > 250  F/u Friday for recheck

## 2015-12-10 NOTE — Progress Notes (Signed)
Patient ID: [name], male   DOB: 06-05-1941, 75 y.o.   MRN: [id number]    Subjective:    Patient ID: [name], male    DOB: 12-Jul-1941, 75 y.o.   MRN: [id number]  Patient presents for Wheezing, SOB, Cough    Pt here with cough, congestion, low grade fever, worsened over past 5 days. No known sick contacts, no GI symptoms. Taking alka seltzer plus. Cough productive of sputum also wheezing and SOB. Swelling has been good with lasix.     Review Of Systems:  GEN- denies fatigue, fever, weight loss,weakness, recent illness HEENT- denies eye drainage, change in vision, nasal discharge, CVS- denies chest pain, palpitations RESP- + SOB, +cough,+ wheeze ABD- denies N/V, change in stools, abd pain Neuro- denies headache, dizziness, syncope, seizure activity       Objective:    BP 152/68 mmHg  Pulse 78  Temp(Src) 99.6 F (37.6 C) (Oral)  Resp 22  Wt 303 lb (137.44 kg)  SpO2 97% GEN- NAD, alert and oriented x3 HEENT- PERRL, EOMI, non injected sclera, pink conjunctiva, MMM, oropharynx mild injection, TM clear bilat no effusion,  + maxillary sinus tenderness, inflammed turbinates,  Nasal drainage  Neck- Supple, no LAD, no JVD CVS- RRR, no murmur RESP-Scattered wheeze, increased WOB, sat 97%, rhonchi bilat  EXT- pitting edema L >r (UNCHANGED) Pulses- Radial 2+         Assessment & Plan:      Problem List Items Addressed This Visit    None    Visit Diagnoses    Acute bronchitis, unspecified organism    -  Primary    Given albuterol neb in office, s/p neb improved WOB and wheeze , prednisone, albuterol q 4 hours, as 2nd illness and with comorobidites concern for development of PNA, start levaquin. Cor[facility] Recheck Friday   BP elevated 2/2 OTC cough med,   CBG may increase, he can go up 1 unit BID if needed       Note: This dictation was prepared with Dragon dictation along with smaller phrase technology. Any transcriptional errors that result from this process are  unintentional.

## 2015-12-12 ENCOUNTER — Ambulatory Visit (INDEPENDENT_AMBULATORY_CARE_PROVIDER_SITE_OTHER): Payer: Medicare Other | Admitting: Family Medicine

## 2015-12-12 VITALS — BP 138/68 | HR 70 | Temp 98.9°F | Resp 18 | Ht 69.0 in | Wt 303.0 lb

## 2015-12-12 DIAGNOSIS — J209 Acute bronchitis, unspecified: Secondary | ICD-10-CM

## 2015-12-12 NOTE — Patient Instructions (Signed)
Try the steroids with dinner tonight Try the antibiotic again in the morning - do not take with steroids - at least an hour apart If you get dizzy again, then call  F/U as previous

## 2015-12-12 NOTE — Progress Notes (Signed)
Patient ID: [name], male   DOB: 02/14/1941, 75 y.o.   MRN: [id number]    Subjective:    Patient ID: [name], male    DOB: 01/18/1941, 75 y.o.   MRN: [id number]  Patient presents for F/U  Patient here to follow-up bronchitis ( 48 hour recheck) with concern is high risk. He was started on prednisone as well as Levaquin. He didn't follow the antibiotics medications the first day yesterday morning when he took them both together first morning he will dizzy afterwards he is not taking any meds today. He is using albuterol inhaler which is felt. His wheezing has significantly improved and less he goes out in cold air he does not have any difficulties. He is now able to sleep without a lot of cough. He has not had any fever. No chest pain.   Review Of Systems:  GEN- denies fatigue, fever, weight loss,weakness, recent illness HEENT- denies eye drainage, change in vision, nasal discharge, CVS- denies chest pain, palpitations RESP- denies SOB, +cough,+ wheeze Neuro- denies headache, dizziness, syncope, seizure activity       Objective:    BP 138/68 mmHg  Pulse 70  Temp(Src) 98.9 F (37.2 C) (Oral)  Resp 18  Ht 5\' 9"  (1.753 m)  Wt 303 lb (137.44 kg)  BMI 44.72 kg/m2  SpO2 97% GEN- NAD, alert and oriented x3 Neck- Supple, no LAD CVS- RRR, no murmur RESP-few Scattered wheeze, normal WOB, no rales EXT- pitting edema L >r (UNCHANGED) Pulses- Radial 2+        Assessment & Plan:      Problem List Items Addressed This Visit    None    Visit Diagnoses    Acute bronchitis, unspecified organism    -  Primary    much improved today, regarding dizziness after meds, will have him take separtely, may have been conincidental as no problems the first day. Monitor CBG, see instructions He will call if problems with antibiotics again       Note: This dictation was prepared with Dragon dictation along with smaller phrase technology. Any transcriptional errors that result from this  process are unintentional.

## 2015-12-22 ENCOUNTER — Ambulatory Visit (INDEPENDENT_AMBULATORY_CARE_PROVIDER_SITE_OTHER): Payer: PPO | Admitting: Family Medicine

## 2015-12-22 ENCOUNTER — Encounter: Payer: Self-pay | Admitting: Family Medicine

## 2015-12-22 VITALS — BP 134/66 | HR 64 | Temp 98.4°F | Resp 16 | Ht 69.0 in | Wt 301.0 lb

## 2015-12-22 DIAGNOSIS — E1122 Type 2 diabetes mellitus with diabetic chronic kidney disease: Secondary | ICD-10-CM | POA: Diagnosis not present

## 2015-12-22 DIAGNOSIS — N183 Chronic kidney disease, stage 3 unspecified: Secondary | ICD-10-CM

## 2015-12-22 DIAGNOSIS — I10 Essential (primary) hypertension: Secondary | ICD-10-CM | POA: Diagnosis not present

## 2015-12-22 DIAGNOSIS — I739 Peripheral vascular disease, unspecified: Secondary | ICD-10-CM | POA: Diagnosis not present

## 2015-12-22 DIAGNOSIS — E669 Obesity, unspecified: Secondary | ICD-10-CM | POA: Diagnosis not present

## 2015-12-22 DIAGNOSIS — Z794 Long term (current) use of insulin: Secondary | ICD-10-CM | POA: Diagnosis not present

## 2015-12-22 LAB — COMPREHENSIVE METABOLIC PANEL
ALT: 12 U/L (ref 9–46)
AST: 13 U/L (ref 10–35)
Albumin: 3.5 g/dL — ABNORMAL LOW (ref 3.6–5.1)
Alkaline Phosphatase: 40 U/L (ref 40–115)
BUN: 20 mg/dL (ref 7–25)
CO2: 23 mmol/L (ref 20–31)
Calcium: 8.9 mg/dL (ref 8.6–10.3)
Chloride: 103 mmol/L (ref 98–110)
Creat: 1.09 mg/dL (ref 0.70–1.18)
Glucose, Bld: 130 mg/dL — ABNORMAL HIGH (ref 70–99)
Potassium: 4.6 mmol/L (ref 3.5–5.3)
Sodium: 138 mmol/L (ref 135–146)
Total Bilirubin: 0.6 mg/dL (ref 0.2–1.2)
Total Protein: 6.8 g/dL (ref 6.1–8.1)

## 2015-12-22 LAB — LIPID PANEL
Cholesterol: 136 mg/dL (ref 125–200)
HDL: 43 mg/dL (ref >=40)
LDL Cholesterol: 73 mg/dL (ref <130)
Total CHOL/HDL Ratio: 3.2 Ratio (ref <=5.0)
Triglycerides: 98 mg/dL (ref <150)
VLDL: 20 mg/dL (ref <30)

## 2015-12-22 LAB — HEMOGLOBIN A1C
Hgb A1c MFr Bld: 7.2 % — ABNORMAL HIGH (ref <5.7)
Mean Plasma Glucose: 160 mg/dL — ABNORMAL HIGH (ref <117)

## 2015-12-22 NOTE — Progress Notes (Signed)
Patient ID: [name], male   DOB: [birth year], 75 y.o.   MRN: [id number]     Subjective:    Patient ID: [name], male    DOB: Dec 30, 1940, 75 y.o.   MRN: [id number]  Patient presents for F/U  patient follow-up chronic medical problems. History of diabetes mellitus coronary artery disease history of stroke peripheral vascular disease.  He has diabetes mellitus his last A1c was 7.3% He is injecting NovoLog 70/30 45 units in the morning and 50 units in the evening. His blood sugars range <140 fasting, no hypoglycemia  He is also on metformin and Actos. He was recently treated for bronchitis and was on prednisone.  He still followed by the [facility] he does not have any upcoming a procedures. Has appt with podiatry. Had recent Eye visit- increased pressure in eyes, planning for cataract removal  Medications medications were reviewed   Review Of Systems:  GEN- denies fatigue, fever, weight loss,weakness, recent illness HEENT- denies eye drainage, change in vision, nasal discharge, CVS- denies chest pain, palpitations RESP- denies SOB, cough, wheeze ABD- denies N/V, change in stools, abd pain GU- denies dysuria, hematuria, dribbling, incontinence MSK-+ joint pain, muscle aches, injury Neuro- denies headache, dizziness, syncope, seizure activity       Objective:    BP 134/66 mmHg  Pulse 64  Temp(Src) 98.4 F (36.9 C) (Oral)  Resp 16  Ht 5\' 9"  (1.753 m)  Wt 301 lb (136.533 kg)  BMI 44.43 kg/m2  SpO2 99% GEN- NAD, alert and oriented x3 HEENT- PERRL, EOMI, non injected sclera, pink conjunctiva, MMM, oropharynx clear Neck- Supple, no thyromegaly CVS- RRR, no murmur RESP-CTAB ABD-NABS,soft,NT,ND EXT-pedal edema Pulses- Radial 2+ DP-+        Assessment & Plan:      Problem List Items Addressed This Visit    PVD (peripheral vascular disease) ([place])   Obesity     He is not consistent with his  Diet and he is not very mobile.      HTN (hypertension)  (Chronic)     Blood pressure well controlled at change of medication      Relevant Orders   Comprehensive metabolic panel   Lipid panel   DM (diabetes mellitus) type II controlled with renal manifestation ([place]) - Primary     Diabetes has been well controlled no change to current insulin dose or oral medications. He is also on statin drug  check his levels today. Note he is not fasting      Relevant Orders   Comprehensive metabolic panel   Lipid panel   Hemoglobin A1c   HM DIABETES FOOT EXAM (Completed)   Chronic kidney disease      Note: This dictation was prepared with Dragon dictation along with smaller phrase technology. Any transcriptional errors that result from this process are unintentional.

## 2015-12-22 NOTE — Patient Instructions (Signed)
We will call with lab results  F/U 4 months for PHYSICAL  

## 2015-12-22 NOTE — Assessment & Plan Note (Signed)
Diabetes has been well controlled no change to current insulin dose or oral medications. He is also on statin drug  check his levels today. Note he is not fasting

## 2015-12-22 NOTE — Assessment & Plan Note (Signed)
Blood pressure well controlled at change of medication

## 2015-12-22 NOTE — Assessment & Plan Note (Signed)
He is not consistent with his  Diet and he is not very mobile.

## 2016-02-02 ENCOUNTER — Ambulatory Visit (INDEPENDENT_AMBULATORY_CARE_PROVIDER_SITE_OTHER): Payer: PPO | Admitting: Family Medicine

## 2016-02-02 ENCOUNTER — Encounter: Payer: Self-pay | Admitting: Family Medicine

## 2016-02-02 VITALS — BP 136/78 | HR 68 | Temp 98.9°F | Resp 14 | Ht 69.0 in | Wt 297.0 lb

## 2016-02-02 DIAGNOSIS — R1012 Left upper quadrant pain: Secondary | ICD-10-CM

## 2016-02-02 LAB — URINALYSIS, ROUTINE W REFLEX MICROSCOPIC
Bilirubin Urine: NEGATIVE
Glucose, UA: NEGATIVE
Ketones, ur: NEGATIVE
Leukocytes, UA: NEGATIVE
Nitrite: NEGATIVE
Specific Gravity, Urine: 1.015 (ref 1.001–1.035)
pH: 5.5 (ref 5.0–8.0)

## 2016-02-02 LAB — URINALYSIS, MICROSCOPIC ONLY
Casts: NONE SEEN [LPF]
Crystals: NONE SEEN [HPF]
Squamous Epithelial / LPF: NONE SEEN [HPF] (ref <=5)
WBC, UA: NONE SEEN WBC/HPF (ref <=5)
Yeast: NONE SEEN [HPF]

## 2016-02-02 NOTE — Progress Notes (Signed)
Patient ID: [name], male   DOB: October 05, 1941, 75 y.o.   MRN: [id number]    Subjective:    Patient ID: [name], male    DOB: 1941/02/19, 75 y.o.   MRN: [id number]  Patient presents for Abdominal Pain  Pt here with Left-sided abdominal pain for the past few weeks which is intermittent. He describes it as a dull ache it is not severe does not affect his activities. There's been no change in his bowel or bladder. He denies any hematuria. He denies any nausea vomiting. There is nothing that brings it on. He's not had any injury in the area. He is taking all his medications as prescribed. The pain is not bad enough to even take anything for it.      Review Of Systems:  GEN- denies fatigue, fever, weight loss,weakness, recent illness HEENT- denies eye drainage, change in vision, nasal discharge, CVS- denies chest pain, palpitations RESP- denies SOB, cough, wheeze ABD- denies N/V, change in stools, +abd pain GU- denies dysuria, hematuria, dribbling, incontinence MSK- denies joint pain, muscle aches, injury Neuro- denies headache, dizziness, syncope, seizure activity       Objective:    BP 136/78 mmHg  Pulse 68  Temp(Src) 98.9 F (37.2 C) (Oral)  Resp 14  Ht 5\' 9"  (1.753 m)  Wt 297 lb (134.718 kg)  BMI 43.84 kg/m2 GEN- NAD, alert and oriented x3,obese  HEENT- PERRL, EOMI, non injected sclera, pink conjunctiva, MMM, oropharynx clear CVS- RRR, no murmur RESP-CTAB ABD-NABS,soft,NT,ND, no CVA tenderness  EXT- No edema Pulses- Radial, DP- 2+        Assessment & Plan:      Problem List Items Addressed This Visit    None    Visit Diagnoses    LUQ abdominal pain    -  Primary    Benign exam, UA unremarkable, pain mild, ? gas or constipation, will monitor for now , if no improvement CT abd    Relevant Orders    Urinalysis, Routine w reflex microscopic (not at [facility]) (Completed)       Note: This dictation was prepared with Dragon dictation along with smaller phrase  technology. Any transcriptional errors that result from this process are unintentional.

## 2016-02-02 NOTE — Patient Instructions (Signed)
Continue current medications Call if pain worsens, then CT scan will be done F/U as previous

## 2016-03-10 ENCOUNTER — Encounter: Payer: Self-pay | Admitting: Family Medicine

## 2016-03-10 ENCOUNTER — Ambulatory Visit: Payer: PPO | Admitting: Family Medicine

## 2016-03-10 ENCOUNTER — Ambulatory Visit (INDEPENDENT_AMBULATORY_CARE_PROVIDER_SITE_OTHER): Payer: PPO | Admitting: Family Medicine

## 2016-03-10 VITALS — BP 144/76 | HR 82 | Temp 98.9°F | Resp 14 | Ht 69.0 in | Wt 306.0 lb

## 2016-03-10 DIAGNOSIS — E669 Obesity, unspecified: Secondary | ICD-10-CM | POA: Diagnosis not present

## 2016-03-10 DIAGNOSIS — E1122 Type 2 diabetes mellitus with diabetic chronic kidney disease: Secondary | ICD-10-CM

## 2016-03-10 DIAGNOSIS — Z794 Long term (current) use of insulin: Secondary | ICD-10-CM | POA: Diagnosis not present

## 2016-03-10 DIAGNOSIS — N183 Chronic kidney disease, stage 3 (moderate): Secondary | ICD-10-CM | POA: Diagnosis not present

## 2016-03-10 MED ORDER — INSULIN ASPART PROT & ASPART (70-30 MIX) 100 UNIT/ML ~~LOC~~ SUSP: SUBCUTANEOUS | Status: DC

## 2016-03-10 NOTE — Progress Notes (Signed)
Patient ID: [name], male   DOB: 07-24-1941, 75 y.o.   MRN: [id number]   Subjective:    Patient ID: [name], male    DOB: [birth year], 75 y.o.   MRN: [id number]  Patient presents for Discuss Weight Loss Plans Issue here to discuss weight. He will like referral to nutritionist. He is struggling with maintaining his diet for his diabetes and continues to gain weight. He states he can stay on his diet for a few weeks and do well but then goes back to eating whatever he wants. He is a lot of fried foods and drinks a lot of sweet tea. His last A1c was 7.2% which is actually good control for his age. He does admit that his sugars have been fluctuating he's been having more hypoglycemic episodes in the morning his lowest was 44 this week. He is currently injecting 40 units in the morning and 45 units of his NovoLog mix.  He has tried going to the diabetic classes but he wants to see someone one-on-one. He is here today with his wife who has been trying to get him to eat healthy for quite some time.  He also had a cramp-like feeling in the left side of his neck a few days ago is now resolved.  Followed by [facility]- plan to have left eye surgery and dental surgery this summer   Review Of Systems:  GEN- denies fatigue, fever, weight loss,weakness, recent illness HEENT- denies eye drainage, change in vision, nasal discharge, CVS- denies chest pain, palpitations RESP- denies SOB, cough, wheeze ABD- denies N/V, change in stools, abd pain GU- denies dysuria, hematuria, dribbling, incontinence MSK- + joint pain, muscle aches, injury Neuro- denies headache, dizziness, syncope, seizure activity       Objective:    BP 144/76 mmHg  Pulse 82  Temp(Src) 98.9 F (37.2 C) (Oral)  Resp 14  Ht 5\' 9"  (1.753 m)  Wt 306 lb (138.801 kg)  BMI 45.17 kg/m2 GEN- NAD, alert and oriented x3 HEENT- PERRL, EOMI, non injected sclera, pink conjunctiva, MMM, oropharynx clear Neck- Supple, no thyromegaly, no spasm,  no bruit  CVS- RRR, no murmur RESP-CTAB EXT-mild pitting edema L >R which is chronic  Pulses- Radial 2+ DP- 1+       Assessment & Plan:      Problem List Items Addressed This Visit    Obesity    Morbidly obese with multiple compounding factors,he just does not adhere to proper diet  Will send to nutrition to try more one on one approach      Relevant Medications   insulin aspart protamine- aspart (NOVOLOG MIX 70/30) (70-30) 100 UNIT/ML injection   DM (diabetes mellitus) type II controlled with renal manifestation ([facility]) - Primary    Diabetes has been well controlled for his age, but now getting hypoglycemic episodes again Will decrease nightime dose to 45 units, continue 40 units in AM  Continue metformin and Actos       Relevant Medications   insulin aspart protamine- aspart (NOVOLOG MIX 70/30) (70-30) 100 UNIT/ML injection      Note: This dictation was prepared with Dragon dictation along with smaller phrase technology. Any transcriptional errors that result from this process are unintentional.

## 2016-03-10 NOTE — Patient Instructions (Addendum)
Decrease nighttime insulin to 45 units  Referral to nutritionist  F/U 2 months

## 2016-03-10 NOTE — Assessment & Plan Note (Signed)
Morbidly obese with multiple compounding factors,he just does not adhere to proper diet  Will send to nutrition to try more one on one approach

## 2016-03-10 NOTE — Assessment & Plan Note (Signed)
Diabetes has been well controlled for his age, but now getting hypoglycemic episodes again Will decrease nightime dose to 45 units, continue 40 units in AM  Continue metformin and Actos

## 2016-05-14 ENCOUNTER — Encounter: Payer: PPO | Attending: Family Medicine | Admitting: Nutrition

## 2016-05-14 ENCOUNTER — Encounter: Payer: Self-pay | Admitting: Nutrition

## 2016-05-14 DIAGNOSIS — N183 Chronic kidney disease, stage 3 (moderate): Secondary | ICD-10-CM | POA: Diagnosis not present

## 2016-05-14 DIAGNOSIS — E1122 Type 2 diabetes mellitus with diabetic chronic kidney disease: Secondary | ICD-10-CM | POA: Diagnosis present

## 2016-05-14 DIAGNOSIS — Z713 Dietary counseling and surveillance: Secondary | ICD-10-CM | POA: Insufficient documentation

## 2016-05-14 DIAGNOSIS — Z794 Long term (current) use of insulin: Secondary | ICD-10-CM | POA: Insufficient documentation

## 2016-05-14 DIAGNOSIS — E1165 Type 2 diabetes mellitus with hyperglycemia: Secondary | ICD-10-CM

## 2016-05-14 DIAGNOSIS — E118 Type 2 diabetes mellitus with unspecified complications: Secondary | ICD-10-CM

## 2016-05-14 NOTE — Progress Notes (Signed)
  Medical Nutrition Therapy:  Appt start time: 1100 end time:  1200.   Assessment:  Primary concerns today: Diabetes. Type 2. A1C 7.2%. . Lives with his wife. His wife is here with him.  Both do the cooking. Some baked, broiled and fried.   Takes 40 units of 70/30 in am and 45 units of 70/30 at night.  Injects in his arm and legs and occasionally in stomach. Eat three meals per day. Physical activity: ADL. Has a cane and has torn his cartlidage in his knee. Motivated to make changes with his diet to improve his DM. Has had low blood sugars at various times, particularly at 2-3 am. Stays up late at night and sleep in during the day. Diet exceeds his needs to maintain good blood sugars. Needs more lowr carb vegetables and cut out snacks between meals. Limited ability to exercise due to knee problems.   Preferred Learning Style:     No preference indicated   Learning Readiness:     Ready  Change in progress   MEDICATIONS: See list   DIETARY INTAKE:  24-hr recall:  B ( AM): Corn flakes with 2% milk. 1 cup, can of Glucerna with meds.   Snk ( AM):   L ( PM): Chicken salad sandwich on white bread, couple cookies-, Diet Sun Drop Snk ( PM): none D ( PM): 2 pieces of fried chicken, creamed potatoes, gravy and coleslaw, Diet Snapple Snk ( PM): occasionally diet popcycle Beverages: Diet sodas, water,  Usual physical activity: ADL  Estimated energy needs: 1800 calories 200 g carbohydrates 135 g protein 50 g fat  Progress Towards Goal(s):  In progress.   Nutritional Diagnosis:  NB-1.1 Food and nutrition-related knowledge deficit As related to DIabetes.  As evidenced by A1C 7.2%.    Intervention:  Nutrition and Diabetes education provided on My Plate, CHO counting, meal planning, portion sizes, timing of meals, avoiding snacks between meals unless having a low blood sugar, target ranges for A1C and blood sugars, signs/symptoms and treatment of hyper/hypoglycemia, monitoring blood  sugars, taking medications as prescribed, benefits of exercising 30 minutes per day and prevention of  complications of DM.  Goals. Follow Plate MEthod Eat meals on time Cut out snacks between meals. Watch portion portions Increase fresh fruits and vegetables. Cut out fried foods and fast foods. Lose 1-2 lbs per week. Look into the [employer] for water aerobics. Get A1C to less than 7%  Teaching Method Utilized:  Visual Auditory Hands on  Handouts given during visit include:  The Plate Method  Meal Plan Card   Barriers to learning/adherence to lifestyle change:  None  Demonstrated degree of understanding via:  Teach Back   Monitoring/Evaluation:  Dietary intake, exercise, meal planning, SBG, and body weight in 1 month(s).

## 2016-05-14 NOTE — Patient Instructions (Signed)
Goals. Follow Plate MEthod Eat meals on time Cut out snacks between meals. Watch portion portions Increase fresh fruits and vegetables. Cut out fried foods and fast foods. Lose 1-2 lbs per week. Look into the [facility] for water aerobics. Get A1C to less than 7%

## 2016-05-21 ENCOUNTER — Encounter: Payer: PPO | Admitting: Nutrition

## 2016-05-21 VITALS — Ht 68.0 in | Wt 287.0 lb

## 2016-05-21 DIAGNOSIS — E1122 Type 2 diabetes mellitus with diabetic chronic kidney disease: Secondary | ICD-10-CM | POA: Diagnosis not present

## 2016-05-21 DIAGNOSIS — E1165 Type 2 diabetes mellitus with hyperglycemia: Secondary | ICD-10-CM

## 2016-05-21 DIAGNOSIS — IMO0002 Reserved for concepts with insufficient information to code with codable children: Secondary | ICD-10-CM

## 2016-05-21 DIAGNOSIS — E118 Type 2 diabetes mellitus with unspecified complications: Principal | ICD-10-CM

## 2016-05-21 DIAGNOSIS — Z794 Long term (current) use of insulin: Principal | ICD-10-CM

## 2016-05-21 NOTE — Progress Notes (Signed)
  Medical Nutrition Therapy:  Appt start time: 1330 end time:  1400   Assessment:  Primary concerns today: Diabetes. Type 2. Lost 21 lbs since last visit. He feels much better! Went to the [place] three days ago and MD reduced 70/30 to 40 units BID instead of 40 units in am an 45 units pm. Was having low blood sugar in middle of the night. Had some surgery left foot for ingrown toenail. Trying to cut out snacks and increase fresh fruits and vegetables and drink more water. Cut out junk food and processed foods.   Preferred Learning Style:     No preference indicated   Learning Readiness:     Ready  Change in progress   MEDICATIONS: See list   DIETARY INTAKE:  24-hr recall:  B ( AM): Corn flakes with 2% milk. 1 cup, 1/2 banana   Snk ( AM):   L ( PM): [place] sandwich on ww bread, water or diet soda Snk ( PM): none D ( PM): 2 pieces of fried chicken, creamed potatoes, gravy and coleslaw, Diet Snapple Snk ( PM): occasionally diet popcycle Beverages: Diet sodas, water,  Usual physical activity: ADL  Estimated energy needs: 1800 calories 200 g carbohydrates 135 g protein 50 g fat  Progress Towards Goal(s):  In progress.   Nutritional Diagnosis:  NB-1.1 Food and nutrition-related knowledge deficit As related to DIabetes.  As evidenced by A1C 7.2%.    Intervention:  Nutrition and Diabetes education provided on My Plate, CHO counting, meal planning, portion sizes, timing of meals, avoiding snacks between meals unless having a low blood sugar, target ranges for A1C and blood sugars, signs/symptoms and treatment of hyper/hypoglycemia, monitoring blood sugars, taking medications as prescribed, benefits of exercising 30 minutes per day and prevention of  complications of DM.  Goals 1. Add protein  With breakfast 2. Increase low carb veggies with lunch and dinner. 3. Get A1C to 7%. 4. Lose 1-2 lbs per week.   Teaching Method Utilized:  Visual Auditory Hands on  Handouts  given during visit include:  The Plate Method  Meal Plan Card   Barriers to learning/adherence to lifestyle change:  None  Demonstrated degree of understanding via:  Teach Back   Monitoring/Evaluation:  Dietary intake, exercise, meal planning, SBG, and body weight in 3 month(s).

## 2016-05-21 NOTE — Patient Instructions (Addendum)
Goals 1. Add protein  With breakfast 2. Increase low carb veggies with lunch and dinner. 3. Get A1C to 7%. 4. Lose 1-2 lbs per week.

## 2016-06-09 ENCOUNTER — Encounter: Payer: Self-pay | Admitting: Family Medicine

## 2016-06-09 ENCOUNTER — Ambulatory Visit (INDEPENDENT_AMBULATORY_CARE_PROVIDER_SITE_OTHER): Payer: PPO | Admitting: Family Medicine

## 2016-06-09 VITALS — BP 134/80 | HR 88 | Temp 98.3°F | Resp 16 | Ht 68.0 in | Wt 284.0 lb

## 2016-06-09 DIAGNOSIS — E1122 Type 2 diabetes mellitus with diabetic chronic kidney disease: Secondary | ICD-10-CM | POA: Diagnosis not present

## 2016-06-09 DIAGNOSIS — N183 Chronic kidney disease, stage 3 unspecified: Secondary | ICD-10-CM

## 2016-06-09 DIAGNOSIS — E785 Hyperlipidemia, unspecified: Secondary | ICD-10-CM | POA: Diagnosis not present

## 2016-06-09 DIAGNOSIS — I10 Essential (primary) hypertension: Secondary | ICD-10-CM | POA: Diagnosis not present

## 2016-06-09 DIAGNOSIS — Z794 Long term (current) use of insulin: Secondary | ICD-10-CM | POA: Diagnosis not present

## 2016-06-09 LAB — CBC WITH DIFFERENTIAL/PLATELET
Basophils Absolute: 0 cells/uL (ref 0–200)
Basophils Relative: 0 %
Eosinophils Absolute: 63 cells/uL (ref 15–500)
Eosinophils Relative: 1 %
HCT: 37.1 % — ABNORMAL LOW (ref 38.5–50.0)
Hemoglobin: 12.3 g/dL — ABNORMAL LOW (ref 13.0–17.0)
Lymphocytes Relative: 30 %
Lymphs Abs: 1890 cells/uL (ref 850–3900)
MCH: 30.4 pg (ref 27.0–33.0)
MCHC: 33.2 g/dL (ref 32.0–36.0)
MCV: 91.6 fL (ref 80.0–100.0)
MPV: 10.6 fL (ref 7.5–12.5)
Monocytes Absolute: 630 cells/uL (ref 200–950)
Monocytes Relative: 10 %
Neutro Abs: 3717 cells/uL (ref 1500–7800)
Neutrophils Relative %: 59 %
Platelets: 217 10*3/uL (ref 140–400)
RBC: 4.05 MIL/uL — ABNORMAL LOW (ref 4.20–5.80)
RDW: 13.9 % (ref 11.0–15.0)
WBC: 6.3 10*3/uL (ref 3.8–10.8)

## 2016-06-09 MED ORDER — TAMSULOSIN HCL 0.4 MG PO CAPS: 0.8000 mg | ORAL_CAPSULE | Freq: Every evening | ORAL | 3 refills | Status: DC

## 2016-06-09 NOTE — Progress Notes (Signed)
   Subjective:    Patient ID: [name], male    DOB: Sep 14, 1941, 75 y.o.   MRN: [id number]  Patient presents for 3 month F/U (is not fasting)  Here follow-up chronic medical problems. He is followed by the [facility] for a lot of his care. He is currently being followed by nutritionist to work on his weight and his diabetes. He has lost 22 pounds since her visit in May.The past couple weeks he has deviated from his diet as he is on soft foods because of problems with his dentures fitting  Diabetes mellitus- his insulin is currently at 40units at nighttime 40 units in the morning he is also on metformin and ActosAs different from her previous visit where he was on 45 units in the morning and 40 in the evening. He was just seen at the [facility] he had labs done in July his A1c was previously 7.2 and his diet is much improved but his labs resulted with an A1c of 8% his cholesterol was normal his creatinine was also elevated at 1.38.  He is scheduled to have eye surgery on September 23  Had partial  toenail removal by podiatry still wearing post op shoe  Review Of Systems:  GEN- denies fatigue, fever, weight loss,weakness, recent illness HEENT- denies eye drainage, change in vision, nasal discharge, CVS- denies chest pain, palpitations RESP- denies SOB, cough, wheeze ABD- denies N/V, change in stools, abd pain GU- denies dysuria, hematuria, dribbling, incontinence MSK- denies joint pain, muscle aches, injury Neuro- denies headache, dizziness, syncope, seizure activity       Objective:    BP 134/80 (BP Location: Right Arm, Patient Position: Sitting, Cuff Size: Large)   Pulse 88   Temp 98.3 F (36.8 C) (Oral)   Resp 16   Ht 5\' 8"  (1.727 m)   Wt 284 lb (128.8 kg)   BMI 43.18 kg/m  GEN- NAD, alert and oriented x3 HEENT- PERRL, EOMI, non injected sclera, pink conjunctiva, MMM, oropharynx clear CVS- RRR, no murmur RESP-CTAB ABD-NABS,soft,NT,ND EXT- trace  pedal L >R  Edema Skin- left great toenail partial nail removal lateral edge, d/c/i  Pulses- Radial, DP- 2+        Assessment & Plan:      Problem List Items Addressed This Visit    Hyperlipidemia   HTN (hypertension) - Primary (Chronic)    Blood pressure is well-controlled. Cholesterol is under good control as well. His nail has healed quite well advised him he can stop the postop shoe      Relevant Orders   CBC with Differential/Platelet   DM (diabetes mellitus) type II controlled with renal manifestation ([facility])    Uncontrolled, concerned about the validity of his recent A1c with his weight loss dietary changes his previous A1c being at 7.2. Recheck this in our labs along with his renal function. We'll adjust his insulin accordingly I'm not sure what it decreased him back with his blood sugars running up his fasting blood sugar this morning was 150      Relevant Orders   Hemoglobin A1c   Chronic kidney disease   Relevant Orders   BASIC METABOLIC PANEL WITH GFR    Other Visit Diagnoses   None.     Note: This dictation was prepared with Dragon dictation along with smaller phrase technology. Any transcriptional errors that result from this process are unintentional.

## 2016-06-09 NOTE — Assessment & Plan Note (Signed)
Blood pressure is well-controlled. Cholesterol is under good control as well. His nail has healed quite well advised him he can stop the postop shoe

## 2016-06-09 NOTE — Patient Instructions (Signed)
F/u 4 MONTHS  We will call with lab results  

## 2016-06-09 NOTE — Assessment & Plan Note (Signed)
Uncontrolled, concerned about the validity of his recent A1c with his weight loss dietary changes his previous A1c being at 7.2. Recheck this in our labs along with his renal function. We'll adjust his insulin accordingly I'm not sure what it decreased him back with his blood sugars running up his fasting blood sugar this morning was 150

## 2016-06-10 LAB — BASIC METABOLIC PANEL WITH GFR
BUN: 12 mg/dL (ref 7–25)
CO2: 21 mmol/L (ref 20–31)
Calcium: 9 mg/dL (ref 8.6–10.3)
Chloride: 105 mmol/L (ref 98–110)
Creat: 1.11 mg/dL (ref 0.70–1.18)
GFR, Est African American: 76 mL/min (ref >=60)
GFR, Est Non African American: 66 mL/min (ref >=60)
Glucose, Bld: 121 mg/dL — ABNORMAL HIGH (ref 70–99)
Potassium: 4.4 mmol/L (ref 3.5–5.3)
Sodium: 140 mmol/L (ref 135–146)

## 2016-06-10 LAB — HEMOGLOBIN A1C
Hgb A1c MFr Bld: 7.8 % — ABNORMAL HIGH (ref <5.7)
Mean Plasma Glucose: 177 mg/dL

## 2016-07-15 ENCOUNTER — Telehealth: Payer: Self-pay | Admitting: Family Medicine

## 2016-07-15 MED ORDER — FUROSEMIDE 20 MG PO TABS: 20.0000 mg | ORAL_TABLET | Freq: Every day | ORAL | 3 refills | Status: DC

## 2016-07-15 NOTE — Telephone Encounter (Signed)
Received call from [place], patient wife.   Reports that pt had eye surgery from [place] on 06/23/2016, and cannot lie flat. States that he has been resting in recliner, but now BLE are swelling. States that he props up feet in recliner, but it is not enough to reduce edema.   MD please advise.

## 2016-07-15 NOTE — Telephone Encounter (Signed)
Call placed to patient and patient wife made aware.   Prescription sent to pharmacy.

## 2016-07-15 NOTE — Telephone Encounter (Signed)
Wife calling to discuss johns leg swelling from the vein surgery he had in 2007, would like to know if dr Buelah Manis will prescribe some fluid pills for him, he has just had eye surgery and cannot come into the office  [phone]

## 2016-07-15 NOTE — Telephone Encounter (Signed)
Send lasix 20mg  x 5 days, he has to find a way to elevate feet more Also drink water with the lasix If not better come in Monday

## 2016-07-19 ENCOUNTER — Ambulatory Visit (INDEPENDENT_AMBULATORY_CARE_PROVIDER_SITE_OTHER): Payer: PPO | Admitting: Family Medicine

## 2016-07-19 ENCOUNTER — Encounter: Payer: Self-pay | Admitting: Family Medicine

## 2016-07-19 VITALS — BP 140/84 | HR 76 | Temp 98.7°F | Resp 18 | Wt 285.0 lb

## 2016-07-19 DIAGNOSIS — Z23 Encounter for immunization: Secondary | ICD-10-CM | POA: Diagnosis not present

## 2016-07-19 DIAGNOSIS — R609 Edema, unspecified: Secondary | ICD-10-CM

## 2016-07-19 DIAGNOSIS — I5032 Chronic diastolic (congestive) heart failure: Secondary | ICD-10-CM | POA: Diagnosis not present

## 2016-07-19 MED ORDER — POTASSIUM CHLORIDE CRYS ER 10 MEQ PO TBCR: 20.0000 meq | EXTENDED_RELEASE_TABLET | Freq: Every day | ORAL | 1 refills | Status: DC

## 2016-07-19 MED ORDER — FUROSEMIDE 40 MG PO TABS: 40.0000 mg | ORAL_TABLET | Freq: Every day | ORAL | 3 refills | Status: DC | PRN

## 2016-07-19 NOTE — Assessment & Plan Note (Signed)
Multifactorial as he has been sitting up to sleep use also increase his sodium intake. I manage all his metabolic panel today we'll start Lasix 40 mg with potassium and will also wear his compression socks. We'll follow-up by phone to make sure his leg swelling is going down and then I will just use the Lasix as needed. No sign of any cardiorespiratory compromise at this point.  Flu shot given

## 2016-07-19 NOTE — Assessment & Plan Note (Signed)
No sign of cardiac decompensation

## 2016-07-19 NOTE — Patient Instructions (Addendum)
Take lasix 40mg  once a day lasix with potassium  Use  Hose  Flu shot  We will call with results Labs to be done F/U as previous

## 2016-07-19 NOTE — Progress Notes (Signed)
   Subjective:    Patient ID: [name], male    DOB: [birth year], 75 y.o.   MRN: [id number]  Patient presents for Acute Visit (feet still swollen) Patient with continued swollen feet. 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 He is also not been very active and has been eating out a lot with regards to swelling. He called in on the 13th and I  started him on Lasix 20 mg as he had another follow-up procedure because there was something wrong  with the implant and could not come in. He has completed the 5 days a Lasix but still has leg swelling which is common in some discomfort when he walks. He states his blood sugars have been good his blood pressure has been okay. He has actually set to see the lung doctor tomorrow for his CPAP and will return for follow-up visit for his eyes on Wednesday. He denies any chest pain or shortness of breath   Review Of Systems:  GEN- denies fatigue, fever, weight loss,weakness, recent illness HEENT- denies eye drainage, change in vision, nasal discharge, CVS- denies chest pain, palpitations RESP- denies SOB, cough, wheeze ABD- denies N/V, change in stools, abd pain GU- denies dysuria, hematuria, dribbling, incontinence MSK-+ joint pain, muscle aches, injury Neuro- denies headache, dizziness, syncope, seizure activity       Objective:    BP 140/84   Pulse 76   Temp 98.7 F (37.1 C)   Resp 18   Wt 285 lb (129.3 kg)   SpO2 99%   BMI 43.33 kg/m  GEN- NAD, alert and oriented x3 HEENT- PERRL, EOMI, non injected sclera, pink conjunctiva, MMM, oropharynx clear NECK- no JVD  CVS- RRR, no murmur RESP-CTAB ABD-NABS,soft,NT,ND EXT- pitting edema bilat l>R - to just below knees  Pulses- Radial 2+, DP-dimninshed      Assessment & Plan:      Problem List Items Addressed This Visit    Peripheral edema - Primary   Multifactorial as he has been sitting up to sleep use also increase his sodium intake. I manage all his metabolic panel today we'll start Lasix 40 mg with potassium and will also wear his compression socks. We'll follow-up by phone to make sure his leg swelling is going down and then I will just use the Lasix as needed. No sign of any cardiorespiratory compromise at this point.  Flu shot given      Relevant Orders   Basic metabolic panel   Chronic diastolic heart failure (HCC)    No sign of cardiac decompensation      Relevant Medications   furosemide (LASIX) 40 MG tablet    Other Visit Diagnoses    Encounter for immunization       Relevant Orders   Flu Vaccine QUAD 36+ mos IM (Completed)      Note: This dictation was prepared with Dragon dictation along with smaller phrase technology. Any transcriptional errors that result from this process are unintentional.

## 2016-07-20 LAB — BASIC METABOLIC PANEL
BUN: 15 mg/dL (ref 7–25)
CO2: 27 mmol/L (ref 20–31)
Calcium: 8.9 mg/dL (ref 8.6–10.3)
Chloride: 102 mmol/L (ref 98–110)
Creat: 0.95 mg/dL (ref 0.70–1.18)
Glucose, Bld: 108 mg/dL — ABNORMAL HIGH (ref 70–99)
Potassium: 4.6 mmol/L (ref 3.5–5.3)
Sodium: 139 mmol/L (ref 135–146)

## 2016-07-28 ENCOUNTER — Ambulatory Visit (INDEPENDENT_AMBULATORY_CARE_PROVIDER_SITE_OTHER): Payer: PPO | Admitting: Family Medicine

## 2016-07-28 ENCOUNTER — Encounter: Payer: Self-pay | Admitting: Family Medicine

## 2016-07-28 VITALS — BP 140/68 | HR 72 | Temp 98.9°F | Resp 14 | Ht 68.0 in | Wt 287.0 lb

## 2016-07-28 DIAGNOSIS — R609 Edema, unspecified: Secondary | ICD-10-CM

## 2016-07-28 LAB — BASIC METABOLIC PANEL
BUN: 14 mg/dL (ref 7–25)
CO2: 24 mmol/L (ref 20–31)
Calcium: 8.9 mg/dL (ref 8.6–10.3)
Chloride: 103 mmol/L (ref 98–110)
Creat: 1.1 mg/dL (ref 0.70–1.18)
Glucose, Bld: 95 mg/dL (ref 70–99)
Potassium: 4.1 mmol/L (ref 3.5–5.3)
Sodium: 138 mmol/L (ref 135–146)

## 2016-07-28 NOTE — Progress Notes (Signed)
   Subjective:    Patient ID: [name], male    DOB: 01-Jul-1941, 75 y.o.   MRN: [id number]  Patient presents for Follow-up (BLE edema )  Patient here to follow-up leg swelling here at her last visit on 9/18 I increase his Lasix to 40 mg he's been sitting in a recliner after having eye surgery and he has not been following his diet has been eating a lot of foods with increased sodium as he is unable to drive. His swelling has gone down in his right leg but is still mostly present in the left which he does have chronic edema in, no SOB, no chest pain     Review Of Systems:  GEN- denies fatigue, fever, weight loss,weakness, recent illness HEENT- denies eye drainage, change in vision, nasal discharge, CVS- denies chest pain, palpitations RESP- denies SOB, cough, wheeze ABD- denies N/V, change in stools, abd pain GU- denies dysuria, hematuria, dribbling, incontinence MSK- denies joint pain, muscle aches, injury Neuro- denies headache, dizziness, syncope, seizure activity       Objective:    BP 140/68 (BP Location: Left Arm, Patient Position: Sitting, Cuff Size: Large)   Pulse 72   Temp 98.9 F (37.2 C) (Oral)   Resp 14   Ht 5\' 8"  (1.727 m)   Wt 287 lb (130.2 kg)   BMI 43.64 kg/m  GEN- NAD, alert and oriented x3 HEENT- PERRL, EOMI, non injected sclera, pink conjunctiva, MMM, oropharynx clear NECK- no JVD  CVS- RRR, no murmur RESP-CTAB ABD-NABS,soft,NT,ND EXT- trace edema  Right leg, mostly at ankle, 1 + Left leg- to just below knees  Pulses- Radial 2+, DP-dimninshed      Assessment & Plan:      Problem List Items Addressed This Visit    Peripheral edema - Primary    continue Lasix 40 mg continue with his compression hose. The swelling has improved the left when it's always more residual compared to the right. Unfortunately he is still in the recliner for the next 2 weeks because of his eye surgery. Discussed the importance of dietary adherence. I don't expect that this  will return to his baseline until he is able to sleep normally and move around a little bit more      Relevant Orders   Basic metabolic panel    Other Visit Diagnoses   None.     Note: This dictation was prepared with Dragon dictation along with smaller phrase technology. Any transcriptional errors that result from this process are unintentional.

## 2016-07-28 NOTE — Assessment & Plan Note (Signed)
continue Lasix 40 mg continue with his compression hose. The swelling has improved the left when it's always more residual compared to the right. Unfortunately he is still in the recliner for the next 2 weeks because of his eye surgery. Discussed the importance of dietary adherence. I don't expect that this will return to his baseline until he is able to sleep normally and move around a little bit more

## 2016-07-28 NOTE — Patient Instructions (Addendum)
Continue the lasix once a day with potassium F/U as scheduled

## 2016-08-19 ENCOUNTER — Ambulatory Visit: Payer: Medicare (Managed Care) | Admitting: Nutrition

## 2016-08-25 ENCOUNTER — Encounter: Payer: Self-pay | Admitting: Nutrition

## 2016-08-25 ENCOUNTER — Encounter: Payer: PPO | Attending: Family Medicine | Admitting: Nutrition

## 2016-08-25 VITALS — Ht 69.0 in | Wt 285.0 lb

## 2016-08-25 DIAGNOSIS — E669 Obesity, unspecified: Secondary | ICD-10-CM | POA: Diagnosis present

## 2016-08-25 DIAGNOSIS — IMO0002 Reserved for concepts with insufficient information to code with codable children: Secondary | ICD-10-CM

## 2016-08-25 DIAGNOSIS — E119 Type 2 diabetes mellitus without complications: Secondary | ICD-10-CM | POA: Insufficient documentation

## 2016-08-25 DIAGNOSIS — Z794 Long term (current) use of insulin: Secondary | ICD-10-CM

## 2016-08-25 DIAGNOSIS — E118 Type 2 diabetes mellitus with unspecified complications: Secondary | ICD-10-CM

## 2016-08-25 DIAGNOSIS — E1165 Type 2 diabetes mellitus with hyperglycemia: Secondary | ICD-10-CM

## 2016-08-25 NOTE — Progress Notes (Signed)
  Medical Nutrition Therapy:  Appt start time: 1400end time:  1430  Assessment:  Primary concerns today: Diabetes. Type 2. Admits to not eating like he is suppose. Has had eye surgery and had teeth pulled a year ago. Can't eat some foods due to no bottom teeth and poor fitting dentures. . Hasn't been able to exercise due to eye surgery and now has a walker with a seat in it and can use it walk in front of house. Lost 2 lbs since last vist.   Insulin 70/30  40 units in am and 50 units at night. Using the insulin pen.   FBS 124 mg this am. Evenings:  140-150's mg/dl.  Denies low blood sugars.    Preferred Learning Style:   Lab Results  Component Value Date   HGBA1C 7.8 (H) 06/09/2016    No preference indicated   Learning Readiness:     Ready  Change in progress   MEDICATIONS: See list   DIETARY INTAKE:  24-hr recall:  B ( AM): Corn flakes with 2% milk. 1 cup, 1/2 banana   Snk ( AM):   L ( PM): BoIled chicken leg, pork and beans 1/2 c Snk ( PM): none D ( PM): Baked tilapia with greens and pinto beans, water  Usual physical activity: walkling a little  Estimated energy needs: 1800 calories 200 g carbohydrates 135 g protein 50 g fat  Progress Towards Goal(s):  In progress.   Nutritional Diagnosis:  NB-1.1 Food and nutrition-related knowledge deficit As related to DIabetes.  As evidenced by A1C 7.2%.    Intervention:  Nutrition and Diabetes education provided on My Plate, CHO counting, meal planning, portion sizes, timing of meals, avoiding snacks between meals unless having a low blood sugar, target ranges for A1C and blood sugars, signs/symptoms and treatment of hyper/hypoglycemia, monitoring blood sugars, taking medications as prescribed, benefits of exercising 30 minutes per day and prevention of  complications of DM.  Goals 1. Increase exercise to 15 mins twice a day 2. Increase low carb vegetables.- cooked greens etc. 3. Cut out snacks between meals 4. Lose 1-2  lbs per week 5. Take meds as prescribed and test blood sugars twice a day. Get A1C down to 7%   Teaching Method Utilized:  Visual Auditory Hands on  Handouts given during visit include:  The Plate Method  Meal Plan Card   Barriers to learning/adherence to lifestyle change:  None  Demonstrated degree of understanding via:  Teach Back   Monitoring/Evaluation:  Dietary intake, exercise, meal planning, SBG, and body weight in 3 month(s).

## 2016-08-25 NOTE — Patient Instructions (Addendum)
Goals 1. Increase exercise to 15 mins twice a day 2. Increase low carb vegetables.- cooked greens etc. 3. Cut out snacks between meals 4. Lose 1-2 lbs per week 5. Take meds as prescribed and test blood sugars twice a day. Get A1C down to 7%

## 2016-10-11 ENCOUNTER — Encounter: Payer: Self-pay | Admitting: Family Medicine

## 2016-10-11 ENCOUNTER — Ambulatory Visit (INDEPENDENT_AMBULATORY_CARE_PROVIDER_SITE_OTHER): Payer: PPO | Admitting: Family Medicine

## 2016-10-11 VITALS — BP 124/60 | HR 64 | Temp 98.1°F | Resp 16 | Wt 286.0 lb

## 2016-10-11 DIAGNOSIS — I10 Essential (primary) hypertension: Secondary | ICD-10-CM

## 2016-10-11 DIAGNOSIS — E6609 Other obesity due to excess calories: Secondary | ICD-10-CM

## 2016-10-11 DIAGNOSIS — G4733 Obstructive sleep apnea (adult) (pediatric): Secondary | ICD-10-CM | POA: Diagnosis not present

## 2016-10-11 DIAGNOSIS — Z794 Long term (current) use of insulin: Secondary | ICD-10-CM | POA: Diagnosis not present

## 2016-10-11 DIAGNOSIS — I2510 Atherosclerotic heart disease of native coronary artery without angina pectoris: Secondary | ICD-10-CM

## 2016-10-11 DIAGNOSIS — N183 Chronic kidney disease, stage 3 unspecified: Secondary | ICD-10-CM

## 2016-10-11 DIAGNOSIS — Z6841 Body Mass Index (BMI) 40.0 and over, adult: Secondary | ICD-10-CM

## 2016-10-11 DIAGNOSIS — E782 Mixed hyperlipidemia: Secondary | ICD-10-CM | POA: Diagnosis not present

## 2016-10-11 DIAGNOSIS — IMO0001 Reserved for inherently not codable concepts without codable children: Secondary | ICD-10-CM

## 2016-10-11 DIAGNOSIS — E1122 Type 2 diabetes mellitus with diabetic chronic kidney disease: Secondary | ICD-10-CM | POA: Diagnosis not present

## 2016-10-11 LAB — COMPREHENSIVE METABOLIC PANEL
ALT: 15 U/L (ref 9–46)
AST: 14 U/L (ref 10–35)
Albumin: 3.6 g/dL (ref 3.6–5.1)
Alkaline Phosphatase: 43 U/L (ref 40–115)
BUN: 19 mg/dL (ref 7–25)
CO2: 25 mmol/L (ref 20–31)
Calcium: 9.1 mg/dL (ref 8.6–10.3)
Chloride: 105 mmol/L (ref 98–110)
Creat: 1.21 mg/dL — ABNORMAL HIGH (ref 0.70–1.18)
Glucose, Bld: 132 mg/dL — ABNORMAL HIGH (ref 70–99)
Potassium: 4.4 mmol/L (ref 3.5–5.3)
Sodium: 141 mmol/L (ref 135–146)
Total Bilirubin: 0.6 mg/dL (ref 0.2–1.2)
Total Protein: 6.7 g/dL (ref 6.1–8.1)

## 2016-10-11 LAB — HEMOGLOBIN A1C
Hgb A1c MFr Bld: 7.1 % — ABNORMAL HIGH (ref <5.7)
Mean Plasma Glucose: 157 mg/dL

## 2016-10-11 LAB — CBC WITH DIFFERENTIAL/PLATELET
Basophils Absolute: 0 cells/uL (ref 0–200)
Basophils Relative: 0 %
Eosinophils Absolute: 59 cells/uL (ref 15–500)
Eosinophils Relative: 1 %
HCT: 39 % (ref 38.5–50.0)
Hemoglobin: 12.3 g/dL — ABNORMAL LOW (ref 13.0–17.0)
Lymphocytes Relative: 28 %
Lymphs Abs: 1652 cells/uL (ref 850–3900)
MCH: 29.9 pg (ref 27.0–33.0)
MCHC: 31.5 g/dL — ABNORMAL LOW (ref 32.0–36.0)
MCV: 94.9 fL (ref 80.0–100.0)
MPV: 10 fL (ref 7.5–12.5)
Monocytes Absolute: 649 cells/uL (ref 200–950)
Monocytes Relative: 11 %
Neutro Abs: 3540 cells/uL (ref 1500–7800)
Neutrophils Relative %: 60 %
Platelets: 263 10*3/uL (ref 140–400)
RBC: 4.11 MIL/uL — ABNORMAL LOW (ref 4.20–5.80)
RDW: 14 % (ref 11.0–15.0)
WBC: 5.9 10*3/uL (ref 3.8–10.8)

## 2016-10-11 LAB — LIPID PANEL
Cholesterol: 136 mg/dL (ref <200)
HDL: 44 mg/dL (ref >40)
LDL Cholesterol: 74 mg/dL (ref <100)
Total CHOL/HDL Ratio: 3.1 Ratio (ref <5.0)
Triglycerides: 91 mg/dL (ref <150)
VLDL: 18 mg/dL (ref <30)

## 2016-10-11 NOTE — Assessment & Plan Note (Signed)
Continue to work on healthy eating choices which has been very difficult for him to sustain

## 2016-10-11 NOTE — Assessment & Plan Note (Signed)
A1C to be rechecked, goal < 7.5% without any hypoglycemia episodes His diet is a major factor with keeping him controlled  Continue Novlog mix

## 2016-10-11 NOTE — Assessment & Plan Note (Signed)
Check labs goal LDL below 100, closer to 70

## 2016-10-11 NOTE — Assessment & Plan Note (Signed)
Controlling risk factors per above

## 2016-10-11 NOTE — Assessment & Plan Note (Signed)
Well controlled no changes 

## 2016-10-11 NOTE — Patient Instructions (Signed)
F/U 4 months We will call with lab results Check on heart doctor appointment at the [facility] No snacks

## 2016-10-11 NOTE — Assessment & Plan Note (Signed)
Continues to benefit from CPAP, supplies from [place]

## 2016-10-11 NOTE — Progress Notes (Signed)
   Subjective:    Patient ID: [name], male    DOB: [birth year], 75 y.o.   MRN: [id number]  Patient presents for Hypertension (f/u) and Hyperlipidemia (f/u) Patient to follow-up chronic medical problems. He is also followed by the [facility] for most of his care. At the last visit back in September we had to make adjustments to his diuretic after he had eye surgery he was very sedentary and started to gain fluid he was last on Lasix 40 mg along with his compression hose.  Diabetes mellitus last A1c 7.8% he is currently on Novlog 70/30 40 units in the morning 45 in the evening He is also on Metformin , did not bring his meter, CBG highest 160's, occasionally feels like its getting low, he has been snacking a lot, not following diabetic diet, last seen by dietician in Oct   His last cholesterol was normal in February 2017 with an LDL 73  Now has life alert and rolling waker   Planning for cataract surgery  Review Of Systems:  GEN- denies fatigue, fever, weight loss,weakness, recent illness HEENT- denies eye drainage, change in vision, nasal discharge, CVS- denies chest pain, palpitations RESP- denies SOB, cough, wheeze ABD- denies N/V, change in stools, abd pain GU- denies dysuria, hematuria, dribbling, incontinence MSK- denies joint pain, muscle aches, injury Neuro- denies headache, dizziness, syncope, seizure activity       Objective:    BP 124/60   Pulse 64   Temp 98.1 F (36.7 C) (Oral)   Resp 16   Wt 286 lb (129.7 kg)   SpO2 98%   BMI 42.23 kg/m  GEN- NAD, alert and oriented x3 HEENT- PERRL, EOMI, non injected sclera, pink conjunctiva, MMM, oropharynx clear Neck- Supple,  CVS- RRR, no murmur RESP-CTAB ABD-NABS,soft,NT,ND EXT-pedal edema RLE, chronic edema LLE  Pulses- Radial, 2+        Assessment & Plan:      Problem List Items Addressed This Visit    OSA (obstructive sleep apnea)    Continues to benefit from CPAP, supplies from [place]       Obesity    Continue to work on healthy eating choices which has been very difficult for him to sustain      Hyperlipidemia    Check labs goal LDL below 100, closer to 70      Relevant Orders   Lipid panel   HTN (hypertension) (Chronic)    Well controlled no changes      Relevant Orders   CBC with Differential/Platelet   Comprehensive metabolic panel   DM (diabetes mellitus) type II controlled with renal manifestation (HCC) - Primary    A1C to be rechecked, goal < 7.5% without any hypoglycemia episodes His diet is a major factor with keeping him controlled  Continue Novlog mix       Relevant Orders   Hemoglobin A1c   CAD (coronary artery disease)    Controlling risk factors per above          Note: This dictation was prepared with Dragon dictation along with smaller phrase technology. Any transcriptional errors that result from this process are unintentional.

## 2016-10-12 ENCOUNTER — Encounter: Payer: Self-pay | Admitting: *Deleted

## 2016-11-25 ENCOUNTER — Encounter: Payer: Medicare HMO | Attending: Family Medicine | Admitting: Nutrition

## 2016-11-25 VITALS — Ht 69.0 in | Wt 300.0 lb

## 2016-11-25 DIAGNOSIS — Z713 Dietary counseling and surveillance: Secondary | ICD-10-CM | POA: Diagnosis not present

## 2016-11-25 DIAGNOSIS — E669 Obesity, unspecified: Secondary | ICD-10-CM | POA: Insufficient documentation

## 2016-11-25 DIAGNOSIS — Z6841 Body Mass Index (BMI) 40.0 and over, adult: Secondary | ICD-10-CM | POA: Diagnosis not present

## 2016-11-25 DIAGNOSIS — E1122 Type 2 diabetes mellitus with diabetic chronic kidney disease: Secondary | ICD-10-CM | POA: Diagnosis not present

## 2016-11-25 DIAGNOSIS — IMO0002 Reserved for concepts with insufficient information to code with codable children: Secondary | ICD-10-CM

## 2016-11-25 DIAGNOSIS — Z794 Long term (current) use of insulin: Secondary | ICD-10-CM | POA: Insufficient documentation

## 2016-11-25 DIAGNOSIS — E1165 Type 2 diabetes mellitus with hyperglycemia: Secondary | ICD-10-CM

## 2016-11-25 DIAGNOSIS — E118 Type 2 diabetes mellitus with unspecified complications: Secondary | ICD-10-CM

## 2016-11-25 DIAGNOSIS — N183 Chronic kidney disease, stage 3 (moderate): Secondary | ICD-10-CM | POA: Diagnosis not present

## 2016-11-25 NOTE — Progress Notes (Signed)
  Medical Nutrition Therapy:  Appt start time: 1100 end time:  1130   Assessment:  Primary concerns today: Diabetes. Type 2. Aq1C down to 7.1 from 7.8%. Weight up 15 lbs in the last month.  Changes made:  He had been walking with walker with wheels that the [place] gave him. Hasn't been as active due to the cold. Encouraged to use the [facility] and treadmill there to walk during day.[name]  Has had a few low blood sugars in middle of night-1-2 a month but not consistently. Tests before breakfast and dinner due to 70/30 insulin. Doesn't usually check before bedtime.     He has cut down on starches and is baking more of his foods now. Cut out snacks. Drinks water. Does eats some saltier foods at times; mac/cheese, ramen noodles etc.  Usually uses Mrs. Dash though. 70/30 40 units in am and 50 units a night.         Encouraged him to talk to PCP about weight gain and make sure its not fluid retention. Stressed need to test BS before bed to evaluate BS and make sure its' >100 before going to sleep.        Diet low in fresh fruits and vegetables and needs more consistency with carbs at meal times.   Preferred Learning Style:   Lab Results  Component Value Date   HGBA1C 7.1 (H) 10/11/2016    No preference indicated   Learning Readiness:     Ready  Change in progress   MEDICATIONS: See list   DIETARY INTAKE:  24-hr recall:  B ( AM): Boiled 2 eggs, Green tea  Snk ( AM):   L ( PM): Hot wings, creamed corn and mac/cheese and water Snk ( PM): none D ( PM): Sandwich with [place] on  White bread and water Usual physical activity: walkling a little  Estimated energy needs: 1800 calories 200 g carbohydrates 135 g protein 50 g fat  Progress Towards Goal(s):  In progress.   Nutritional Diagnosis:  NB-1.1 Food and nutrition-related knowledge deficit As related to DIabetes.  As evidenced by A1C 7.2%.    Intervention:  Nutrition and Diabetes education provided on My Plate, CHO counting, meal  planning, portion sizes, timing of meals, avoiding snacks between meals unless having a low blood sugar, target ranges for A1C and blood sugars, signs/symptoms and treatment of hyper/hypoglycemia, monitoring blood sugars, taking medications as prescribed, benefits of exercising 30 minutes per day and prevention of  complications of DM.  Goals Increase low carb vegetables. Drink only water Avoid mac/cheese, ramen  Noodles,  Get Healthy Choice soups or Tv dinner Use Mrs. Dash, Onion powder, garlic powder but no SALTS Walk 15 minutes a day-try out the [employer] Dr. Buelah Manis about weigh gain ? Fluid Cut out chicken nuggets and make home made ones at home as discussed. Keep fruit cup at bedside if BS drops  Teaching Method Utilized:  Visual Auditory Hands on  Handouts given during visit include:  The Plate Method  Meal Plan Card   Barriers to learning/adherence to lifestyle change:  None  Demonstrated degree of understanding via:  Teach Back   Monitoring/Evaluation:  Dietary intake, exercise, meal planning, SBG, and body weight in 3 month(s).   He may benefit from SGLT2 or GLP1 for lowering BS and weight loss.

## 2016-11-25 NOTE — Patient Instructions (Addendum)
Goals Increase low carb vegetables. Drink only water Avoid mac/cheese, ramen  Noodles,  Get Healthy Choice soups or Tv dinner Use Mrs. Dash, Onion powder, garlic powder but no SALTS Walk 15 minutes a day-try out the [employer] Dr. Buelah Manis about weigh gain ? Fluid Cut out chicken nuggets and make home made ones at home as discussed. Keep fruit cup at bedside if BS drops

## 2016-11-29 ENCOUNTER — Ambulatory Visit: Payer: Self-pay | Admitting: Family Medicine

## 2016-11-30 ENCOUNTER — Ambulatory Visit (INDEPENDENT_AMBULATORY_CARE_PROVIDER_SITE_OTHER): Payer: Medicare HMO | Admitting: Family Medicine

## 2016-11-30 ENCOUNTER — Encounter: Payer: Self-pay | Admitting: Family Medicine

## 2016-11-30 VITALS — BP 140/72 | HR 64 | Temp 98.1°F | Resp 18 | Ht 68.0 in | Wt 297.8 lb

## 2016-11-30 DIAGNOSIS — I5032 Chronic diastolic (congestive) heart failure: Secondary | ICD-10-CM

## 2016-11-30 DIAGNOSIS — R609 Edema, unspecified: Secondary | ICD-10-CM | POA: Diagnosis not present

## 2016-11-30 NOTE — Patient Instructions (Addendum)
Take 40mg  twice a day for next 3 days  Take with potassium  Keep legs elevated Cut out the salt  F/U as previous

## 2016-11-30 NOTE — Progress Notes (Signed)
   Subjective:    Patient ID: [name], male    DOB: 12/19/1940, 76 y.o.   MRN: [id number]  Patient presents for Weight Check (is not fasting)  Patient here to follow-up his weight. He was seen by his nutritionist who noted an almost 15 pound weight down in the past few months. I saw her at the beginning of December at that time his weight was 286 pounds today 297 pounds. He admits to not following his diet at all has been eating biscuits sausage fried chicken soda many sweets. He has noticed some fluid retention in his legs more than usual he has been taking Lasix daily and wear his compression hose. His typical shortness of breath with exertion is at his baseline he has not had any chest pain has not had any cough or illness states that he feels well. His blood sugars are doing okay he did not bring his meter with him today his last A1c a month ago was 7.1%. He was seen by Dr. Ivin Booty over at [facility] in [place].  Does have cardiologist with the [facility] he has not required any recent appointments he was last seen about a year ago.[name] He has chronic diastolic dysfunction as well as coronary artery disease   Review Of Systems:  GEN- denies fatigue, fever, weight loss,weakness, recent illness HEENT- denies eye drainage, change in vision, nasal discharge, CVS- denies chest pain, palpitations RESP- denies SOB, cough, wheeze ABD- denies N/V, change in stools, abd pain GU- denies dysuria, hematuria, dribbling, incontinence MSK- denies joint pain, muscle aches, injury Neuro- denies headache, dizziness, syncope, seizure activity       Objective:    BP 140/72 (BP Location: Left Arm, Patient Position: Sitting, Cuff Size: Large)   Pulse 64   Temp 98.1 F (36.7 C) (Oral)   Resp 18   Ht 5\' 8"  (1.727 m)   Wt 297 lb 12.8 oz (135.1 kg)   SpO2 98%   BMI 45.28 kg/m  GEN- NAD, alert and oriented x3 HEENT- PERRL, EOMI, non injected sclera, pink conjunctiva, MMM, oropharynx  clear Neck- Supple, no JVD CVS- RRR, no murmur RESP-CTAB ABD-NABS,soft,NT,ND Ext- Swelling L >R, CHRONIC EDEMA, MILD INcrease in pitting edema  Pulses- Radial,2+        Assessment & Plan:      Problem List Items Addressed This Visit    Peripheral edema    Increase lasix to 40mg  twice a day for 3 days, then resume regular dosing Take with potassium Diet is major issue which is chronic for pt, while he has some fluid, not enough for 10lbs and he is not symptomatic        Chronic diastolic heart failure ([place]) - Primary      Note: This dictation was prepared with Dragon dictation along with smaller phrase technology. Any transcriptional errors that result from this process are unintentional.

## 2016-12-01 ENCOUNTER — Encounter: Payer: Self-pay | Admitting: Family Medicine

## 2016-12-01 NOTE — Assessment & Plan Note (Signed)
Increase lasix to 40mg  twice a day for 3 days, then resume regular dosing Take with potassium Diet is major issue which is chronic for pt, while he has some fluid, not enough for 10lbs and he is not symptomatic

## 2016-12-28 ENCOUNTER — Ambulatory Visit (INDEPENDENT_AMBULATORY_CARE_PROVIDER_SITE_OTHER): Payer: Medicare HMO | Admitting: Family Medicine

## 2016-12-28 ENCOUNTER — Encounter: Payer: Self-pay | Admitting: Family Medicine

## 2016-12-28 VITALS — BP 138/82 | HR 66 | Temp 98.6°F | Resp 16 | Ht 68.0 in | Wt 301.0 lb

## 2016-12-28 DIAGNOSIS — M79675 Pain in left toe(s): Secondary | ICD-10-CM | POA: Diagnosis not present

## 2016-12-28 DIAGNOSIS — E1122 Type 2 diabetes mellitus with diabetic chronic kidney disease: Secondary | ICD-10-CM | POA: Diagnosis not present

## 2016-12-28 DIAGNOSIS — N183 Chronic kidney disease, stage 3 unspecified: Secondary | ICD-10-CM

## 2016-12-28 DIAGNOSIS — Z794 Long term (current) use of insulin: Secondary | ICD-10-CM

## 2016-12-28 NOTE — Patient Instructions (Signed)
F/U as previous 

## 2016-12-28 NOTE — Progress Notes (Signed)
   Subjective:    Patient ID: [name], male    DOB: November 03, 1940, 76 y.o.   MRN: [id number]  Patient presents for L 2cd Toe Pain (x2 days- pain to 2cd toe on L foot- denies injury)  She here with pain to his second toe on his left foot for the past couple of days. States he only has pain when he is trying to put his compression hose on. He has not had any swelling no injury to the foot. Once he gets his compression hose on he does not have any further pain.  He also had episode of hypoglycemia today states that he ate to eggs and a piece of sausage his blood sugar was initially 91 he took his insulin which is 40 units in the morning and 50 units at bedtime. About an hour before the appointment he felt shaky his blood pressure was down to 40 he ate something and his blood sugar did come up. His last A1c was 7.1% 2 months ago.  His also had some blurry vision since he had a procedure done on his left eye 2 weeks ago. It is not constant, has appt on  Monday for eye doctor on Monday they're managing a different procedure. NO headache, vision grossly in tact  Using prescribed drop in left eye    Review Of Systems:  GEN- denies fatigue, fever, weight loss,weakness, recent illness HEENT- denies eye drainage, +change in vision, nasal discharge, CVS- denies chest pain, palpitations RESP- denies SOB, cough, wheeze ABD- denies N/V, change in stools, abd pain GU- denies dysuria, hematuria, dribbling, incontinence MSK- +joint pain, muscle aches, injury Neuro- denies headache, dizziness, syncope, seizure activity       Objective:    BP 138/82   Pulse 66   Temp 98.6 F (37 C) (Oral)   Resp 16   Ht 5\' 8"  (1.727 m)   Wt (!) 301 lb (136.5 kg)   SpO2 98%   BMI 45.77 kg/m  GEN- NAD, alert and oriented x3 HEENT- PERRL, EOMI, non injected sclera, pink conjunctiva, MMM, oropharynx clear CVS- RRR, no murmur RESP-CTAB Ext- Swelling L >R, CHRONIC EDEMA, MILD INcrease in pitting edema  MSK-FROM  foot/toe, no erythema, callus and thick nails bilat, toe joint NT, no ulceration  Pulses- Radial,2+ DP 1+      Assessment & Plan:      Problem List Items Addressed This Visit    DM (diabetes mellitus) type II controlled with renal manifestation (HCC)    Discussed the consistency with his eating. Some days he overeats too much of his cars and his blood sugars jump up to 200s other times he does not eat as much. Advised that he can decrease his insulin by 1 unit if he is having a lot of daytime shots and his sugar thank you benign on his blood sugar.   His toe exam is completely normal as he knows on infection he's not having pain on exam actually put his compression hose on he did not experience the pain that he had at home. No sign of gout        Other Visit Diagnoses    Pain of toe of left foot    -  Primary      Note: This dictation was prepared with Dragon dictation along with smaller phrase technology. Any transcriptional errors that result from this process are unintentional.

## 2016-12-28 NOTE — Assessment & Plan Note (Signed)
Discussed the consistency with his eating. Some days he overeats too much of his cars and his blood sugars jump up to 200s other times he does not eat as much. Advised that he can decrease his insulin by 1 unit if he is having a lot of daytime shots and his sugar thank you benign on his blood sugar.   His toe exam is completely normal as he knows on infection he's not having pain on exam actually put his compression hose on he did not experience the pain that he had at home. No sign of gout

## 2017-02-04 DIAGNOSIS — E785 Hyperlipidemia, unspecified: Secondary | ICD-10-CM | POA: Diagnosis not present

## 2017-02-04 DIAGNOSIS — H409 Unspecified glaucoma: Secondary | ICD-10-CM | POA: Diagnosis not present

## 2017-02-04 DIAGNOSIS — E119 Type 2 diabetes mellitus without complications: Secondary | ICD-10-CM | POA: Diagnosis not present

## 2017-02-04 DIAGNOSIS — Z794 Long term (current) use of insulin: Secondary | ICD-10-CM | POA: Diagnosis not present

## 2017-02-04 DIAGNOSIS — I10 Essential (primary) hypertension: Secondary | ICD-10-CM | POA: Diagnosis not present

## 2017-02-04 DIAGNOSIS — R600 Localized edema: Secondary | ICD-10-CM | POA: Diagnosis not present

## 2017-02-04 DIAGNOSIS — K08109 Complete loss of teeth, unspecified cause, unspecified class: Secondary | ICD-10-CM | POA: Diagnosis not present

## 2017-02-04 DIAGNOSIS — Z972 Presence of dental prosthetic device (complete) (partial): Secondary | ICD-10-CM | POA: Diagnosis not present

## 2017-02-04 DIAGNOSIS — Z7902 Long term (current) use of antithrombotics/antiplatelets: Secondary | ICD-10-CM | POA: Diagnosis not present

## 2017-02-04 DIAGNOSIS — N401 Enlarged prostate with lower urinary tract symptoms: Secondary | ICD-10-CM | POA: Diagnosis not present

## 2017-02-04 DIAGNOSIS — Z0000 Encounter for general adult medical examination without abnormal findings: Secondary | ICD-10-CM | POA: Diagnosis not present

## 2017-02-11 ENCOUNTER — Encounter: Payer: Self-pay | Admitting: Family Medicine

## 2017-02-11 ENCOUNTER — Ambulatory Visit (INDEPENDENT_AMBULATORY_CARE_PROVIDER_SITE_OTHER): Payer: Medicare HMO | Admitting: Family Medicine

## 2017-02-11 VITALS — BP 132/78 | HR 80 | Temp 98.9°F | Resp 16 | Ht 68.0 in | Wt 295.0 lb

## 2017-02-11 DIAGNOSIS — N183 Chronic kidney disease, stage 3 unspecified: Secondary | ICD-10-CM

## 2017-02-11 DIAGNOSIS — E1122 Type 2 diabetes mellitus with diabetic chronic kidney disease: Secondary | ICD-10-CM

## 2017-02-11 DIAGNOSIS — Z794 Long term (current) use of insulin: Secondary | ICD-10-CM | POA: Diagnosis not present

## 2017-02-11 DIAGNOSIS — E782 Mixed hyperlipidemia: Secondary | ICD-10-CM

## 2017-02-11 DIAGNOSIS — I10 Essential (primary) hypertension: Secondary | ICD-10-CM | POA: Diagnosis not present

## 2017-02-11 LAB — CBC WITH DIFFERENTIAL/PLATELET
Basophils Absolute: 0 cells/uL (ref 0–200)
Basophils Relative: 0 %
Eosinophils Absolute: 81 cells/uL (ref 15–500)
Eosinophils Relative: 1 %
HCT: 39.4 % (ref 38.5–50.0)
Hemoglobin: 12.8 g/dL — ABNORMAL LOW (ref 13.0–17.0)
Lymphocytes Relative: 25 %
Lymphs Abs: 2025 cells/uL (ref 850–3900)
MCH: 30.8 pg (ref 27.0–33.0)
MCHC: 32.5 g/dL (ref 32.0–36.0)
MCV: 94.7 fL (ref 80.0–100.0)
MPV: 10.3 fL (ref 7.5–12.5)
Monocytes Absolute: 729 cells/uL (ref 200–950)
Monocytes Relative: 9 %
Neutro Abs: 5265 cells/uL (ref 1500–7800)
Neutrophils Relative %: 65 %
Platelets: 243 10*3/uL (ref 140–400)
RBC: 4.16 MIL/uL — ABNORMAL LOW (ref 4.20–5.80)
RDW: 14.1 % (ref 11.0–15.0)
WBC: 8.1 10*3/uL (ref 3.8–10.8)

## 2017-02-11 LAB — COMPREHENSIVE METABOLIC PANEL
ALT: 13 U/L (ref 9–46)
AST: 12 U/L (ref 10–35)
Albumin: 3.5 g/dL — ABNORMAL LOW (ref 3.6–5.1)
Alkaline Phosphatase: 39 U/L — ABNORMAL LOW (ref 40–115)
BUN: 20 mg/dL (ref 7–25)
CO2: 25 mmol/L (ref 20–31)
Calcium: 9.2 mg/dL (ref 8.6–10.3)
Chloride: 106 mmol/L (ref 98–110)
Creat: 1.19 mg/dL — ABNORMAL HIGH (ref 0.70–1.18)
Glucose, Bld: 56 mg/dL — ABNORMAL LOW (ref 70–99)
Potassium: 4 mmol/L (ref 3.5–5.3)
Sodium: 142 mmol/L (ref 135–146)
Total Bilirubin: 0.5 mg/dL (ref 0.2–1.2)
Total Protein: 6.7 g/dL (ref 6.1–8.1)

## 2017-02-11 NOTE — Assessment & Plan Note (Signed)
Lipids at goal, on statin

## 2017-02-11 NOTE — Assessment & Plan Note (Signed)
Good Control, check A1C, continue insulin therapy Has eye doctor and podiatrist congraluated on weight loss

## 2017-02-11 NOTE — Assessment & Plan Note (Signed)
Controlled no changes 

## 2017-02-11 NOTE — Patient Instructions (Addendum)
We will call with lab results F/U 4 months for Physical  

## 2017-02-11 NOTE — Progress Notes (Signed)
   Subjective:    Patient ID: [name], male    DOB: November 30, 1940, 76 y.o.   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 Currently taking prednisone and 2 eye drops      DM- CBG this AM 121  , 40UNITS AND 50 at night, Last A1C 7.1%, had low sugar yesterday but did not eat enough with insulin   Weight down 6lbs    HTN- well controlled , no Difficulty with medications. His leg swelling is also staying down.  No new concerns today.    Review Of Systems:  GEN- denies fatigue, fever, weight loss,weakness, recent illness HEENT- denies eye drainage, change in vision, nasal discharge, CVS- denies chest pain, palpitations RESP- denies SOB, cough, wheeze ABD- denies N/V, change in stools, abd pain GU- denies dysuria, hematuria, dribbling, incontinence MSK- denies joint pain, muscle aches, injury Neuro- denies headache, dizziness, syncope, seizure activity       Objective:    BP 132/78   Pulse 80   Temp 98.9 F (37.2 C) (Oral)   Resp 16   Ht 5\' 8"  (1.727 m)   Wt 295 lb (133.8 kg)   SpO2 97%   BMI 44.85 kg/m  GEN- NAD, alert and oriented x3 HEENT- PERRL, EOMI, non injected sclera, pink conjunctiva, MMM, oropharynx clear CVS- RRR, no murmur RESP-CTAB Ext- Swelling L >R, CHRONIC EDEMA,  MSK-a, callus and thick nails bilat, toe joint NT, no ulceration  Pulses- Radial,2+ DP 1+        Assessment & Plan:      Problem List Items Addressed This Visit    Hyperlipidemia    Lipids at goal, on statin      HTN (hypertension) (Chronic)    Controlled no changes       DM (diabetes mellitus) type II controlled with renal manifestation (HCC) - Primary    Good Control, check A1C, continue insulin therapy Has eye doctor and podiatrist congraluated on weight loss       Relevant Orders   CBC with  Differential/Platelet   Comprehensive metabolic panel   Hemoglobin A1c   Chronic kidney disease   Relevant Orders   Comprehensive metabolic panel      Note: This dictation was prepared with Dragon dictation along with smaller phrase technology. Any transcriptional errors that result from this process are unintentional.

## 2017-02-12 LAB — HEMOGLOBIN A1C
Hgb A1c MFr Bld: 7.6 % — ABNORMAL HIGH (ref <5.7)
Mean Plasma Glucose: 171 mg/dL

## 2017-02-21 ENCOUNTER — Ambulatory Visit: Payer: Medicare (Managed Care) | Admitting: Nutrition

## 2017-03-01 ENCOUNTER — Encounter: Payer: Medicare HMO | Attending: Family Medicine | Admitting: Nutrition

## 2017-03-01 DIAGNOSIS — E118 Type 2 diabetes mellitus with unspecified complications: Secondary | ICD-10-CM | POA: Diagnosis not present

## 2017-03-01 DIAGNOSIS — N183 Chronic kidney disease, stage 3 (moderate): Secondary | ICD-10-CM | POA: Insufficient documentation

## 2017-03-01 DIAGNOSIS — Z794 Long term (current) use of insulin: Secondary | ICD-10-CM | POA: Insufficient documentation

## 2017-03-01 DIAGNOSIS — E1122 Type 2 diabetes mellitus with diabetic chronic kidney disease: Secondary | ICD-10-CM | POA: Insufficient documentation

## 2017-03-01 NOTE — Patient Instructions (Signed)
Goals 1. Walk 15 minutes twice a day 2. Increase low carb vegetables.  Lose 3-4 lbs per month Get A1C down to 7%

## 2017-03-01 NOTE — Progress Notes (Signed)
  Medical Nutrition Therapy:  Appt start time: 1330 end time: 1400  Assessment:  Primary concerns today: Diabetes. Type 2. A1C back up to 7.6% from  7.1  Just came off the prednisone for eye. Says he feels like he has eating so much due to the prednisone and can't help it. Had a cataract surgery.  70/30 40 units in am and 50 units in pm.      Testing twice a day;120's in am bedtime its about 120-130's. Denies any low blood sugars.   He has cut down on starches and is baking more of his foods now. Cut out snacks. Drinks water.  Usually uses Mrs. Dash.               Diet low in fresh fruits and vegetables and needs more consistency with carbs at meal times.  He is working on trying to do more walking during the day and wants to lose weight and get his A1C back down. Seems to be better engaged with making changes.   Preferred Learning Style: hands on and verbals.. Vision limited at times.  Lab Results  Component Value Date   HGBA1C 7.6 (H) 02/11/2017    No preference indicated   Learning Readiness:     Ready  Change in progress   MEDICATIONS: See list   DIETARY INTAKE:  24-hr recall:  B ( AM): [place] sausage, egg beaters,  coffee  Snk ( AM):   L ( PM): Chicken, limas/corn,1 cup, Snk ( PM): none D ( PM): Sandwich with [place] on  White bread and water Usual physical activity: walkling a little  Estimated energy needs: 1800 calories 200 g carbohydrates 135 g protein 50 g fat  Progress Towards Goal(s):  In progress.   Nutritional Diagnosis:  NB-1.1 Food and nutrition-related knowledge deficit As related to DIabetes.  As evidenced by A1C 7.2%.    Intervention:  Nutrition and Diabetes education provided on My Plate, CHO counting, meal planning, portion sizes, timing of meals, avoiding snacks between meals unless having a low blood sugar, target ranges for A1C and blood sugars, signs/symptoms and treatment of hyper/hypoglycemia, monitoring blood sugars, taking medications as  prescribed, benefits of exercising 30 minutes per day and prevention of  complications of DM.    Goals 1. Walk 15 minutes twice a day 2. Increase low carb vegetables.  Lose 3-4 lbs per month Get A1C down to 7%  Teaching Method Utilized:  Visual Auditory Hands on  Handouts given during visit include:  The Plate Method  Meal Plan Card   Barriers to learning/adherence to lifestyle change:  None  Demonstrated degree of understanding via:  Teach Back   Monitoring/Evaluation:  Dietary intake, exercise, meal planning, SBG, and body weight in 3 month(s).

## 2017-04-13 ENCOUNTER — Ambulatory Visit (INDEPENDENT_AMBULATORY_CARE_PROVIDER_SITE_OTHER): Payer: Medicare HMO | Admitting: Family Medicine

## 2017-04-13 ENCOUNTER — Encounter: Payer: Self-pay | Admitting: Family Medicine

## 2017-04-13 VITALS — BP 128/62 | HR 66 | Temp 98.7°F | Resp 16 | Ht 68.0 in | Wt 297.0 lb

## 2017-04-13 DIAGNOSIS — I5032 Chronic diastolic (congestive) heart failure: Secondary | ICD-10-CM | POA: Diagnosis not present

## 2017-04-13 DIAGNOSIS — R609 Edema, unspecified: Secondary | ICD-10-CM | POA: Diagnosis not present

## 2017-04-13 DIAGNOSIS — G459 Transient cerebral ischemic attack, unspecified: Secondary | ICD-10-CM

## 2017-04-13 DIAGNOSIS — I10 Essential (primary) hypertension: Secondary | ICD-10-CM | POA: Diagnosis not present

## 2017-04-13 LAB — COMPREHENSIVE METABOLIC PANEL
ALT: 14 U/L (ref 9–46)
AST: 14 U/L (ref 10–35)
Albumin: 3.7 g/dL (ref 3.6–5.1)
Alkaline Phosphatase: 43 U/L (ref 40–115)
BUN: 18 mg/dL (ref 7–25)
CO2: 26 mmol/L (ref 20–31)
Calcium: 8.9 mg/dL (ref 8.6–10.3)
Chloride: 104 mmol/L (ref 98–110)
Creat: 1.16 mg/dL (ref 0.70–1.18)
Glucose, Bld: 123 mg/dL — ABNORMAL HIGH (ref 70–99)
Potassium: 4.5 mmol/L (ref 3.5–5.3)
Sodium: 140 mmol/L (ref 135–146)
Total Bilirubin: 0.8 mg/dL (ref 0.2–1.2)
Total Protein: 6.5 g/dL (ref 6.1–8.1)

## 2017-04-13 LAB — CBC WITH DIFFERENTIAL/PLATELET
Basophils Absolute: 0 cells/uL (ref 0–200)
Basophils Relative: 0 %
Eosinophils Absolute: 55 cells/uL (ref 15–500)
Eosinophils Relative: 1 %
HCT: 38.3 % — ABNORMAL LOW (ref 38.5–50.0)
Hemoglobin: 12.2 g/dL — ABNORMAL LOW (ref 13.0–17.0)
Lymphocytes Relative: 30 %
Lymphs Abs: 1650 cells/uL (ref 850–3900)
MCH: 30.5 pg (ref 27.0–33.0)
MCHC: 31.9 g/dL — ABNORMAL LOW (ref 32.0–36.0)
MCV: 95.8 fL (ref 80.0–100.0)
MPV: 9.5 fL (ref 7.5–12.5)
Monocytes Absolute: 275 cells/uL (ref 200–950)
Monocytes Relative: 5 %
Neutro Abs: 3520 cells/uL (ref 1500–7800)
Neutrophils Relative %: 64 %
Platelets: 275 10*3/uL (ref 140–400)
RBC: 4 MIL/uL — ABNORMAL LOW (ref 4.20–5.80)
RDW: 14 % (ref 11.0–15.0)
WBC: 5.5 10*3/uL (ref 3.8–10.8)

## 2017-04-13 MED ORDER — FUROSEMIDE 40 MG PO TABS: ORAL_TABLET | ORAL | 3 refills | Status: DC

## 2017-04-13 MED ORDER — POTASSIUM CHLORIDE CRYS ER 10 MEQ PO TBCR: 20.0000 meq | EXTENDED_RELEASE_TABLET | Freq: Every day | ORAL | 2 refills | Status: DC

## 2017-04-13 NOTE — Patient Instructions (Addendum)
EKG looks good I think he had a TIA or mini strokes  Take lasix twice a day for the next 5 days  We will call with lab results

## 2017-04-13 NOTE — Assessment & Plan Note (Signed)
Blood pressure looks okay no changes medications otherwise.

## 2017-04-13 NOTE — Assessment & Plan Note (Signed)
Increase Lasix to 40 mg twice a day. of pulmonary edema noted on exam. his weight is only up a few pounds. he is to take potassium with this. he started wearing his compression hose. dietary appearance has been difficult for many years. he has very high sodium intake causing more of his fluid retention.

## 2017-04-13 NOTE — Progress Notes (Signed)
Subjective:    Patient ID: [name], male    DOB: 1940/12/24, 76 y.o.   MRN: [id number]  Patient presents for Increased Fluid Retention (x3 day- edema to BLE, SOB) and L Arm/ Leg Numbness (x2 days- intermittent numbness to L side)   Pt here With his wife. States he had not been feeling good for the past couple of days. Sunday morning he got up Head numbness down his left arm as well as his left leg and weakness. This lasted for a couple of hours and then improved. He still has some residual change in sensation but this is also side where he had his major stroke on therefore he has some left-sided weakness. He did not alert his wife at the time. He also noticed more swelling and has been a little bit more short of breath the past few weeks. He denies any chest pain nausea vomiting no sweating episodes. He states that his sugars been good but his wife states that he has been eating terribly states he ate 4 hotdogs Sunday morning before the episode he has been eating lots of snacks and breads and cookies and cakes. She does not know what his blood sugars have really been. He was briefly put on methotrexate and Foley gases by his ophthalmologist to care to chronic inflammation in his eyes   Review Of Systems: per above   GEN- denies fatigue, fever, weight loss,+weakness, recent illness HEENT- denies eye drainage, change in vision, nasal discharge, CVS- denies chest pain, palpitations RESP- +SOB, cough, wheeze ABD- denies N/V, change in stools, abd pain GU- denies dysuria, hematuria, dribbling, incontinence MSK- denies joint pain, muscle aches, injury Neuro- denies headache, dizziness, syncope, seizure activity       Objective:    BP 128/62   Pulse 66   Temp 98.7 F (37.1 C) (Oral)   Resp 16   Ht 5\' 8"  (1.727 m)   Wt 297 lb (134.7 kg)   SpO2 99%   BMI 45.16 kg/m  GEN- NAD, alert and oriented x3 HEENT- PERRL, EOMI, non injected sclera, pink conjunctiva, MMM, oropharynx clear CVS-  RRR, no murmur RESP-CTAB ABD-NABS,soft,NT,ND NEURO-CNII-XII in tact, decreased sensation LUE on forearm compared to right, decrease in DTR left side compared to right, moving extremeties equally strength 4+/5 left , compared to right  Ext- Swelling L >R, CHRONIC EDEMA,  Pulses- Radial,2+ DP 1+  EKG- NSR, 1st degree AV block unchanged old inferior infarct noted on previous EKG's unchanged        Assessment & Plan:      Problem List Items Addressed This Visit    Peripheral edema   HTN (hypertension) - Primary (Chronic)    Blood pressure looks okay no changes medications otherwise.      Relevant Medications   furosemide (LASIX) 40 MG tablet   Other Relevant Orders   Electrocardiogram report (Completed)   CBC with Differential/Platelet   Chronic diastolic heart failure (HCC)    Increase Lasix to 40 mg twice a day. of pulmonary edema noted on exam. his weight is only up a few pounds. he is to take potassium with this. he started wearing his compression hose. dietary appearance has been difficult for many years. he has very high sodium intake causing more of his fluid retention.      Relevant Medications   furosemide (LASIX) 40 MG tablet   Other Relevant Orders   CBC with Differential/Platelet   Comprehensive metabolic panel   Brain natriuretic peptide  Other Visit Diagnoses    Transient cerebral ischemia, unspecified type       Based on history I think he had another TIA. advised to seek care during these events. He is back at baseline. so will not image at this time, check labs, contiue aggrenox which has been recommended by cardiology/neurology. 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 Any transcriptional errors that result from this process are unintentional.

## 2017-04-14 LAB — BRAIN NATRIURETIC PEPTIDE: Brain Natriuretic Peptide: 61.3 pg/mL (ref <100)

## 2017-06-15 ENCOUNTER — Ambulatory Visit (INDEPENDENT_AMBULATORY_CARE_PROVIDER_SITE_OTHER): Payer: Medicare HMO | Admitting: Family Medicine

## 2017-06-15 ENCOUNTER — Encounter: Payer: Self-pay | Admitting: Family Medicine

## 2017-06-15 VITALS — BP 138/78 | HR 62 | Temp 98.2°F | Resp 16 | Ht 68.0 in | Wt 300.0 lb

## 2017-06-15 DIAGNOSIS — I5032 Chronic diastolic (congestive) heart failure: Secondary | ICD-10-CM

## 2017-06-15 DIAGNOSIS — E1122 Type 2 diabetes mellitus with diabetic chronic kidney disease: Secondary | ICD-10-CM | POA: Diagnosis not present

## 2017-06-15 DIAGNOSIS — G4733 Obstructive sleep apnea (adult) (pediatric): Secondary | ICD-10-CM | POA: Diagnosis not present

## 2017-06-15 DIAGNOSIS — I2510 Atherosclerotic heart disease of native coronary artery without angina pectoris: Secondary | ICD-10-CM

## 2017-06-15 DIAGNOSIS — N183 Chronic kidney disease, stage 3 unspecified: Secondary | ICD-10-CM

## 2017-06-15 DIAGNOSIS — Z794 Long term (current) use of insulin: Secondary | ICD-10-CM

## 2017-06-15 DIAGNOSIS — Z6841 Body Mass Index (BMI) 40.0 and over, adult: Secondary | ICD-10-CM

## 2017-06-15 DIAGNOSIS — Z0000 Encounter for general adult medical examination without abnormal findings: Secondary | ICD-10-CM | POA: Diagnosis not present

## 2017-06-15 DIAGNOSIS — E66813 Obesity, class 3: Secondary | ICD-10-CM

## 2017-06-15 DIAGNOSIS — Z8673 Personal history of transient ischemic attack (TIA), and cerebral infarction without residual deficits: Secondary | ICD-10-CM

## 2017-06-15 NOTE — Patient Instructions (Addendum)
F/U 4 months  Take the water pill twice a day with potassium  Appointment with heart doctor

## 2017-06-15 NOTE — Progress Notes (Signed)
Subjective:   Patient presents for Medicare Annual/Subsequent preventive examination.    DM- last A1C 7.3 % on 70/30   40 units in the morning 50 units , last night had low sugar of 54 , ate something and it came up   Hyperlipidemia-  Recent LDL 54, on statin drug, goal < 100 with history of stroke CAD   Diastolic heart failure/leg edema- continues to have swelling taking lasix 40mg  daily  Reviewed labs from [place] recently     Review Past Medical/Family/Social: Per EMR   Risk Factors  Current exercise habits: None Dietary issues discussed: Yes  Cardiac risk factors: Obesity (BMI >= 30 kg/m2). HTN, DM, STroke,CAD  Depression Screen  (Note: if answer to either of the following is "Yes", a more complete depression screening is indicated)  Over the past two weeks, have you felt down, depressed or hopeless? No Over the past two weeks, have you felt little interest or pleasure in doing things? No Have you lost interest or pleasure in daily life? No Do you often feel hopeless? No Do you cry easily over simple problems? No   Activities of Daily Living  In your present state of health, do you have any difficulty performing the following activities?:  Driving? No  Managing money? No  Feeding yourself? No  Getting from bed to chair? No  Climbing a flight of stairs? No  Preparing food and eating?: No  Bathing or showering? No  Getting dressed: No  Getting to the toilet? No  Using the toilet:No  Moving around from place to place: No  In the past year have you fallen or had a near fall?:No  Are you sexually active? No  Do you have more than one partner? No   Hearing Difficulties: No  Do you often ask people to speak up or repeat themselves? No  Do you experience ringing or noises in your ears? No Do you have difficulty understanding soft or whispered voices? No  Do you feel that you have a problem with memory? yes Do you often misplace items? yes Do you feel safe at home?  Yes  Cognitive Testing  Alert? Yes Normal Appearance?Yes  Oriented to person? Yes Place? Yes  Time? Yes  Recall of three objects? Yes  Can perform simple calculations? Yes  Displays appropriate judgment?Yes  Can read the correct time from a watch face?Yes   List the Names of Other Physician/Practitioners you currently use:  [facility]  [facility]  [facility] Manuella Ghazi [facility]  Pulmonary at [facility]- CPAP - [facility]  [place]      Screening Tests / Date Colonoscopy  UTD                   Zostavax UTD Pneumonia- UTD Influenza Vaccine  UTD Tetanus/tdap UTD  ROS: GEN- denies fatigue, fever, weight loss,weakness, recent illness HEENT- denies eye drainage, change in vision, nasal discharge, CVS- denies chest pain, palpitations RESP- denies SOB, cough, wheeze ABD- denies N/V, change in stools, abd pain GU- denies dysuria, hematuria, dribbling, incontinence MSK- denies joint pain, muscle aches, injury Neuro- denies headache, dizziness, syncope, seizure activity  PHYSICAL:  GEN- NAD, alert and oriented x3 HEENT- PERRL, EOMI, non injected sclera, pink conjunctiva, MMM, oropharynx clear CVS- RRR, no murmur RESP-CTAB ABD-NABS,soft,NT,ND Ext- Swelling L >R, CHRONIC EDEMA,  Pulses- Radial,2+ DP 1+     Assessment:    Annual wellness medicare exam   Plan:  During the course of the visit the patient was educated and counseled about appropriate screening and preventive services including:  Prevention UTD  Screen Neg for depression     CHF/ CAD- significant cardic history and diabetic- following with nutrition but he is not consistent often leading to some fluid overload, increase lasix back to 40mg  BID, will get visit with local cardioligst CKD is stable and will tolerate lasix at this time  DM- improved, his diet fluctuates with carbs, but need to decrease lows, so he can tweak the evening dose of 70/30 down 1-2 units  if he does not eat full meal to prevent hypoglycemia  Discussed advanced directives   Diet review for nutrition referral? Yes ____ Not Indicated __x__  Currently seeing intermittantly  Patient Instructions (the written plan) was given to the patient.  Medicare Attestation  I have personally reviewed:  The patient's medical and social history  Their use of alcohol, tobacco or illicit drugs  Their current medications and supplements  The patient's functional ability including ADLs,fall risks, home safety risks, cognitive, and hearing and visual impairment  Diet and physical activities  Evidence for depression or mood disorders  The patient's weight, height, BMI, and visual acuity have been recorded in the chart. I have made referrals, counseling, and provided education to the patient based on review of the above and I have provided the patient with a written personalized care plan for preventive services.

## 2017-06-22 ENCOUNTER — Encounter: Payer: Medicare HMO | Attending: Family Medicine | Admitting: Nutrition

## 2017-06-22 VITALS — Ht 69.0 in | Wt 293.0 lb

## 2017-06-22 DIAGNOSIS — E669 Obesity, unspecified: Secondary | ICD-10-CM | POA: Diagnosis not present

## 2017-06-22 DIAGNOSIS — N183 Chronic kidney disease, stage 3 (moderate): Secondary | ICD-10-CM | POA: Insufficient documentation

## 2017-06-22 DIAGNOSIS — Z713 Dietary counseling and surveillance: Secondary | ICD-10-CM | POA: Insufficient documentation

## 2017-06-22 DIAGNOSIS — E1122 Type 2 diabetes mellitus with diabetic chronic kidney disease: Secondary | ICD-10-CM | POA: Diagnosis not present

## 2017-06-22 DIAGNOSIS — Z794 Long term (current) use of insulin: Secondary | ICD-10-CM | POA: Diagnosis not present

## 2017-06-22 DIAGNOSIS — IMO0002 Reserved for concepts with insufficient information to code with codable children: Secondary | ICD-10-CM

## 2017-06-22 DIAGNOSIS — E1165 Type 2 diabetes mellitus with hyperglycemia: Secondary | ICD-10-CM

## 2017-06-22 DIAGNOSIS — E118 Type 2 diabetes mellitus with unspecified complications: Secondary | ICD-10-CM

## 2017-06-22 NOTE — Patient Instructions (Addendum)
Goals 1. Choose low sodium [place] or chicken lunch meat from the deli. 2. Cut bologna and hot dogs. 3. Eat 1 piece of fruit with each meal 4. Add 2 servings of vegetables with lunch 5. Use MRs. Dash and other herbs and spices  6. Make sure eating 2-3 carb choices per meal. 7. Walk 5-10 minutes a day with walker. Blood sugars before bed should be 100-150 mg/dl Take 48 units of 70/30 before dinner and 40 units before breakfast per Dr. Buelah Manis

## 2017-06-22 NOTE — Progress Notes (Signed)
  Medical Nutrition Therapy:  Appt start time: 1330 end time: 1400  Assessment:  Primary concerns today: Diabetes. Type 2. A1C back up to 7.6% from 7.1%.  Changes made since MD visit: has cut out fried foods and eating more baked foods. Cut out chicken wings. Has been seen for fluid and now on fluid pill. Is working on using salt free seasonings. He forgot his meter and log sheet today.[name] FBS was 118 mg/dl this am he reports.. Currently taking 40 units in am and 50 units at night of 70/30 insulin. Had a low blood sugar of 54 mg/dl a week ago.  He now has reduced it to 48 units at night to prevent low blood sugar per Dr. Buelah Manis.    Not exercising but willing to get back walking again now that his breathing is doing better and fluid is down.  Preferred Learning Style: hands on and verbals.. Vision limited at times.  Lab Results  Component Value Date   HGBA1C 7.6 (H) 02/11/2017    No preference indicated   Learning Readiness:     Ready  Change in progress   MEDICATIONS: See list   DIETARY INTAKE:  24-hr recall:  B ( AM): 2 eggs,   Snk ( AM):   L ( PM): bologna sandwich on wheat bread. Snk ( PM): none D ( PM): baked chicken, string beans and mashed potatoes.  Usual physical activity: walkling a little  Estimated energy needs: 1800 calories 200 g carbohydrates 135 g protein 50 g fat  Progress Towards Goal(s):  In progress.   Nutritional Diagnosis:  NB-1.1 Food and nutrition-related knowledge deficit As related to DIabetes.  As evidenced by A1C 7.2%.    Intervention:  Nutrition and Diabetes education provided on My Plate, CHO counting, meal planning, portion sizes, timing of meals, avoiding snacks between meals unless having a low blood sugar, target ranges for A1C and blood sugars, signs/symptoms and treatment of hyper/hypoglycemia, monitoring blood sugars, taking medications as prescribed, benefits of exercising 30 minutes per day and prevention of  complications of DM.    Goals 1. Choose low sodium [place] or chicken lunch meat from the deli. 2. Cut bologna and hot dogs. 3. Eat 1 piece of fruit with each meal 4. Add 2 servings of vegetables with lunch 5. Use MRs. Dash and other herbs and spices  6. Make sure eating 2-3 carb choices per meal. 7. Walk 5-10 minutes a day with walker. Blood sugars before bed should be 100-150 mg/dl Take 48 units of 70/30 before dinner and 40 units before breakfast per Dr. Buelah Manis  Teaching Method Utilized:  Visual Auditory Hands on  Handouts given during visit include:  The Plate Method  Meal Plan Card   Barriers to learning/adherence to lifestyle change:  None  Demonstrated degree of understanding via:  Teach Back   Monitoring/Evaluation:  Dietary intake, exercise, meal planning, SBG, and body weight in 3 month(s).

## 2017-07-15 ENCOUNTER — Encounter: Payer: Self-pay | Admitting: Cardiology

## 2017-07-15 ENCOUNTER — Ambulatory Visit (INDEPENDENT_AMBULATORY_CARE_PROVIDER_SITE_OTHER): Payer: Medicare HMO | Admitting: Cardiology

## 2017-07-15 VITALS — BP 118/62 | HR 62 | Ht 68.0 in | Wt 298.0 lb

## 2017-07-15 DIAGNOSIS — I10 Essential (primary) hypertension: Secondary | ICD-10-CM

## 2017-07-15 DIAGNOSIS — R600 Localized edema: Secondary | ICD-10-CM

## 2017-07-15 DIAGNOSIS — I25119 Atherosclerotic heart disease of native coronary artery with unspecified angina pectoris: Secondary | ICD-10-CM

## 2017-07-15 DIAGNOSIS — E782 Mixed hyperlipidemia: Secondary | ICD-10-CM

## 2017-07-15 NOTE — Progress Notes (Signed)
Cardiology Office Note  Date: 07/15/2017   ID: [name], DOB December 29, 1940, MRN [id number]  PCP: Charles Rossetti, MD  Consulting Cardiologist: Charles Lesches, MD   Chief Complaint  Patient presents with  . Coronary Artery Disease    History of Present Illness: [name] is a 76 y.o. male referred for cardiology consultation by Dr. 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 He is here today with his wife. History includes multivessel CAD status post CABG in [place] back in 2007 through the [facility] system. He states that he underwent a cardiac catheterization at that time as part of a preoperative evaluation with diagnosis of multivessel disease. He has a family history of premature CAD as well. He has not had any regular cardiology follow-up.  He states that at current level of activity he does not report any obvious angina symptoms, has NYHA class II dyspnea. He has had trouble with leg swelling, left worse than right, had vein harvesting on the left side years ago. He is on Norvasc which could be contributing, also looks to have a component of lymphedema on examination. He does take diuretics. He has otherwise not reported orthopnea or PND. No palpitations or syncope.  I reviewed his current medications. Cardiac regimen includes Lipitor, Norvasc, Aggrenox, Lasix, Cozaar, potassium supplements, and Lopressor.  His last echocardiogram was in 2016 at which point LVEF was 60-65% with grade 2 diastolic dysfunction.  I personally reviewed his ECG today which shows sinus rhythm with prolonged PR interval, leftward axis versus old inferior infarct pattern, and poor wave progression.  Past Medical History:  Diagnosis Date  . C. difficile diarrhea   . 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 Coronary artery disease    Status post CABG in 2007 [facility] [place])  . 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 GERD (gastroesophageal reflux disease)   . Glaucoma   . Hypertension   . Peripheral vascular disease ([place])   . Sleep apnea   . Stroke [facility]) 2008, 2014, 2015    Past Surgical History:  Procedure Laterality Date  . CHOLECYSTECTOMY    . CORONARY ARTERY BYPASS GRAFT    . NO PAST SURGERIES    . THYROID SURGERY      Current Outpatient Prescriptions  Medication Sig Dispense Refill  . amLODipine (NORVASC) 10 MG tablet Take 10 mg by mouth daily.    [name] atorvastatin (LIPITOR) 80 MG tablet Take 40 mg by mouth every evening.     [name] atropine 1 % ophthalmic solution Place 1 drop into the left eye daily.     . brimonidine (ALPHAGAN) 0.2 % ophthalmic solution Place 1 drop into the left eye 3 (three) times daily.     [name] dipyridamole-aspirin (AGGRENOX) 200-25 MG per 12 hr capsule Take 1 capsule by mouth 2 (two) times daily.    . dorzolamide-timolol (COSOPT) 22.3-6.8 MG/ML ophthalmic solution Place 1 drop into both eyes 2 (two) times daily.    . folic acid (FOLVITE) 1 MG tablet Take 1 mg by mouth.    . furosemide (LASIX) 40 MG tablet Take 1 tablet twice a day as needed 60 tablet 3  . insulin aspart protamine- aspart (NOVOLOG MIX 70/30) (70-30) 100 UNIT/ML injection Inject into the skin. Patient takes 40 units in the morning and 50 units at night.    [name] ketorolac (ACULAR) 0.5 % ophthalmic solution Place 1 drop  into the right eye 4 (four) times daily.     [name] latanoprost (XALATAN) 0.005 % ophthalmic solution Place 1 drop into both eyes at bedtime.    [name] losartan (COZAAR) 100 MG tablet Take 100 mg by mouth daily.      . metFORMIN (GLUCOPHAGE) 1000 MG tablet Take 1,000 mg by mouth 2 (two) times daily with a meal.      . methotrexate (RHEUMATREX) 2.5 MG tablet Take 20 mg by mouth once a week.    . metoprolol (LOPRESSOR) 50 MG tablet Take 0.5 tablets (25 mg total) by mouth 2 (two) times daily. FOR HEART/BLOOD PRESSURE. HOLD FOR SYSTOLIC BLOOD PRESSURE LESS THAN 1OO/HEART RATE LESS THAN 60    . pioglitazone  (ACTOS) 30 MG tablet Take 30 mg by mouth daily.     . potassium chloride (K-DUR,KLOR-CON) 10 MEQ tablet Take 2 tablets (20 mEq total) by mouth daily. Take with lasix 60 tablet 2  . tamsulosin (FLOMAX) 0.4 MG CAPS capsule Take 2 capsules (0.8 mg total) by mouth every evening. 60 capsule 3   No current facility-administered medications for this visit.    Allergies:  Patient has no known allergies.   Social History: The patient  reports that he has quit smoking. He has never used smokeless tobacco. He reports that he does not drink alcohol or use drugs.   Family History: The patient's family history includes Diabetes in his brother, father, mother, and sister; Heart failure in his brother, father, mother, and sister; Hyperlipidemia in his sister; Hypertension in his brother, father, mother, and sister.   ROS:  Please see the history of present illness. Otherwise, complete review of systems is positive for occasional reflux.  All other systems are reviewed and negative.   Physical Exam: VS:  BP 118/62 (BP Location: Left Arm)   Pulse 62   Ht 5\' 8"  (1.727 m)   Wt 298 lb (135.2 kg)   BMI 45.31 kg/m , BMI Body mass index is 45.31 kg/m.  Wt Readings from Last 3 Encounters:  07/15/17 298 lb (135.2 kg)  06/22/17 293 lb (132.9 kg)  06/15/17 300 lb (136.1 kg)    General: Morbidly obese male, appears comfortable at rest. HEENT: Conjunctiva and lids normal, oropharynx clear. Neck: Supple, increased girth without obvious elevated JVP or carotid bruits, no thyromegaly. Lungs: Decreased breath sounds without wheezing, nonlabored breathing at rest. Cardiac: Regular rate and rhythm, no S3 or significant systolic murmur, no pericardial rub. Abdomen: Protuberant, old healed surgical scars noted, bowel sounds present, no guarding or rebound. Extremities: Chronic appearing 2+ leg edema, left worse than right, also evidence of lymphedema on the left, distal pulses 1-2+. Skin: Warm and  dry. Musculoskeletal: No kyphosis. Neuropsychiatric: Alert and oriented x3, affect grossly appropriate.  ECG: I personally reviewed the tracing from 10/10/2015 which showed sinus rhythm with prolonged PR interval, possible old inferior infarct, and poor R wave progression.   Recent Labwork: 04/13/2017: ALT 14; AST 14; Brain Natriuretic Peptide 61.3; BUN 18; Creat 1.16; Hemoglobin 12.2; Platelets 275; Potassium 4.5; Sodium 140     Component Value Date/Time   CHOL 136 10/11/2016 1042   TRIG 91 10/11/2016 1042   HDL 44 10/11/2016 1042   CHOLHDL 3.1 10/11/2016 1042   VLDL 18 10/11/2016 1042   LDLCALC 74 10/11/2016 1042    Other Studies Reviewed Today:  Echocardiogram 10/13/2015: Study Conclusions  - Left ventricle: The cavity size was normal. Wall thickness was   increased in a pattern of moderate LVH. Systolic  function was   normal. The estimated ejection fraction was in the range of 60%   to 65%. Wall motion was normal; there were no regional wall   motion abnormalities. Features are consistent with a pseudonormal   left ventricular filling pattern, with concomitant abnormal   relaxation and increased filling pressure (grade 2 diastolic   dysfunction). - Aortic valve: Mildly calcified annulus. Trileaflet; normal   thickness leaflets. Valve area (VTI): 2.74 cm^2. Valve area   (Vmax): 2.93 cm^2. - Mitral valve: Mildly calcified annulus. Normal thickness leaflets. - Left atrium: The atrium was moderately dilated. - Atrial septum: No defect or patent foramen ovale was identified. - Pulmonary arteries: PA peak pressure: 31 mm Hg (S). PASP is   borderline elevated. - Technically difficult study. Echocontrast used to enhance   visualization.  Assessment and Plan:, Blood pressure well controlled today.  1. Multivessel CAD status post CABG in 2007. He does not endorse obvious angina symptoms. I reviewed his ECG which is abnormal and without significant change in comparison to 2016. He  has not undergone any interval ischemic evaluation and we will arrange a 2 day [place] medical therapy to assess ischemic burden.  2. Leg swelling, likely multifactorial. He has some evidence of lymphedema as well and symptoms are left worse than right with previous vein harvesting on the left. Recommend continuing diuretic, using compression stockings. Weight loss would also be beneficial and optimizing treatment for obstructive sleep apnea. We will obtain an echocardiogram to ensure no interval development of cardiomyopathy.  3. Essential hypertension, blood pressure well controlled today. He continues on Norvasc, Lopressor and Cozaar. Norvasc could also be contributed to some of his leg swelling.  4. Hyperlipidemia, on Lipitor. Last LDL 74.  Current medicines were reviewed with the patient today.   Orders Placed This Encounter  Procedures  . NM Myocar Multi W/Spect W/Wall Motion / EF  . EKG 12-Lead  . ECHOCARDIOGRAM COMPLETE    Disposition: Follow-up in 6 months.  Signed, Satira Sark, MD, [facility] 07/15/2017 3:11 PM    [place] at [facility] [address], [place], [place] [address] Phone: [phone]; Fax: [phone]

## 2017-07-15 NOTE — Patient Instructions (Signed)
Your physician wants you to follow-up in: 6 months with Dr.McDowell You will receive a reminder letter in the mail two months in advance. If you don't receive a letter, please call our office to schedule the follow-up appointment.     Your physician has requested that you have a lexiscan myoview (2 DAY STUDY). For further information please visit [email or web address]. Please follow instruction sheet, as given.    Your physician has requested that you have an echocardiogram. Echocardiography is a painless test that uses sound waves to create images of your heart. It provides your doctor with information about the size and shape of your heart and how well your heart's chambers and valves are working. This procedure takes approximately one hour. There are no restrictions for this procedure.    Your physician recommends that you continue on your current medications as directed. Please refer to the Current Medication list given to you today.    If you need a refill on your cardiac medications before your next appointment, please call your pharmacy.       No lab work ordered today        Thank you for choosing [facility] !

## 2017-07-26 ENCOUNTER — Encounter (HOSPITAL_BASED_OUTPATIENT_CLINIC_OR_DEPARTMENT_OTHER)
Admission: RE | Admit: 2017-07-26 | Discharge: 2017-07-26 | Disposition: A | Discharge status: Home / Self Care | Payer: Medicare HMO | Source: Ambulatory Visit | Attending: Cardiology | Admitting: Cardiology

## 2017-07-26 ENCOUNTER — Ambulatory Visit (HOSPITAL_BASED_OUTPATIENT_CLINIC_OR_DEPARTMENT_OTHER)
Admission: RE | Admit: 2017-07-26 | Discharge: 2017-07-26 | Disposition: A | Discharge status: Home / Self Care | Payer: Medicare HMO | Source: Ambulatory Visit | Attending: Cardiology | Admitting: Cardiology

## 2017-07-26 ENCOUNTER — Encounter (HOSPITAL_COMMUNITY): Payer: Medicare HMO

## 2017-07-26 ENCOUNTER — Encounter (HOSPITAL_COMMUNITY)
Admission: RE | Admit: 2017-07-26 | Discharge: 2017-07-26 | Disposition: A | Discharge status: Home / Self Care | Payer: Medicare HMO | Source: Ambulatory Visit | Attending: Cardiology | Admitting: Cardiology

## 2017-07-26 DIAGNOSIS — I25119 Atherosclerotic heart disease of native coronary artery with unspecified angina pectoris: Secondary | ICD-10-CM | POA: Insufficient documentation

## 2017-07-26 DIAGNOSIS — Z87891 Personal history of nicotine dependence: Secondary | ICD-10-CM

## 2017-07-26 DIAGNOSIS — E119 Type 2 diabetes mellitus without complications: Secondary | ICD-10-CM | POA: Insufficient documentation

## 2017-07-26 DIAGNOSIS — Z8673 Personal history of transient ischemic attack (TIA), and cerebral infarction without residual deficits: Secondary | ICD-10-CM | POA: Insufficient documentation

## 2017-07-26 DIAGNOSIS — I10 Essential (primary) hypertension: Secondary | ICD-10-CM

## 2017-07-26 DIAGNOSIS — E785 Hyperlipidemia, unspecified: Secondary | ICD-10-CM

## 2017-07-26 MED ORDER — SODIUM CHLORIDE 0.9% FLUSH
INTRAVENOUS | Status: AC
  Administered 2017-07-26: 10 mL via INTRAVENOUS
  Filled 2017-07-26: qty 10

## 2017-07-26 MED ORDER — PERFLUTREN LIPID MICROSPHERE
1.0000 mL | INTRAVENOUS | Status: AC | PRN
  Administered 2017-07-26: 2 mL via INTRAVENOUS
  Filled 2017-07-26: qty 10

## 2017-07-26 MED ORDER — REGADENOSON 0.4 MG/5ML IV SOLN
INTRAVENOUS | Status: AC
  Administered 2017-07-26: 0.4 mg via INTRAVENOUS
  Filled 2017-07-26: qty 5

## 2017-07-26 NOTE — Progress Notes (Signed)
*  PRELIMINARY RESULTS* Echocardiogram 2D Echocardiogram with definity has been performed.  Leavy Cella 07/26/2017, 2:04 PM

## 2017-07-27 ENCOUNTER — Encounter (HOSPITAL_COMMUNITY)
Admission: RE | Admit: 2017-07-27 | Discharge: 2017-07-27 | Disposition: A | Discharge status: Home / Self Care | Payer: Medicare HMO | Source: Ambulatory Visit | Attending: Cardiology | Admitting: Cardiology

## 2017-07-27 ENCOUNTER — Encounter (HOSPITAL_COMMUNITY): Payer: Self-pay

## 2017-07-27 DIAGNOSIS — I25119 Atherosclerotic heart disease of native coronary artery with unspecified angina pectoris: Secondary | ICD-10-CM | POA: Diagnosis not present

## 2017-07-27 LAB — NM MYOCAR MULTI W/SPECT W/WALL MOTION / EF
LV dias vol: 137 mL (ref 62–150)
LV sys vol: 49 mL
Peak HR: 72 {beats}/min
RATE: 0.52
Rest HR: 58 {beats}/min
SDS: 5
SRS: 3
SSS: 8
TID: 1.06

## 2017-07-27 MED ORDER — TECHNETIUM TC 99M TETROFOSMIN IV KIT
30.0000 | PACK | Freq: Once | INTRAVENOUS | Status: AC | PRN
  Administered 2017-07-27: 30 via INTRAVENOUS

## 2017-07-27 MED ORDER — TECHNETIUM TC 99M TETROFOSMIN IV KIT
25.0000 | PACK | Freq: Once | INTRAVENOUS | Status: AC | PRN
  Administered 2017-07-27: 25 via INTRAVENOUS

## 2017-08-04 ENCOUNTER — Telehealth: Payer: Self-pay | Admitting: Family Medicine

## 2017-08-04 DIAGNOSIS — R059 Cough, unspecified: Secondary | ICD-10-CM

## 2017-08-04 DIAGNOSIS — R05 Cough: Secondary | ICD-10-CM

## 2017-08-04 NOTE — Telephone Encounter (Signed)
Pt's wife Vaughan Basta called [facility] stating that she wanted to pt to have another CXR as he is still having probs with his breathing. MD Please advise.

## 2017-08-05 ENCOUNTER — Ambulatory Visit (HOSPITAL_COMMUNITY)
Admission: RE | Admit: 2017-08-05 | Discharge: 2017-08-05 | Disposition: A | Discharge status: Home / Self Care | Payer: Medicare HMO | Source: Ambulatory Visit | Attending: Family Medicine | Admitting: Family Medicine

## 2017-08-05 DIAGNOSIS — R05 Cough: Secondary | ICD-10-CM | POA: Diagnosis not present

## 2017-08-05 DIAGNOSIS — R059 Cough, unspecified: Secondary | ICD-10-CM

## 2017-08-05 NOTE — Telephone Encounter (Signed)
Triage make sure he does not need to go to ER He is complex pt with heart failure history If just cold symptoms can get CXR

## 2017-08-05 NOTE — Telephone Encounter (Signed)
Call placed to patient.   Reports that he does not know why Vaughan Basta called requesting CXR. States that he has some slight SOB upon exertion, but denies swelling or cough.   Patient wife states that patient does have some slight coughing.   CXR ordered. Provider aware.

## 2017-09-12 ENCOUNTER — Encounter: Payer: Medicare HMO | Attending: Family Medicine | Admitting: Nutrition

## 2017-09-12 ENCOUNTER — Telehealth: Payer: Self-pay | Admitting: Family Medicine

## 2017-09-12 VITALS — Ht 68.0 in | Wt 295.0 lb

## 2017-09-12 DIAGNOSIS — E1122 Type 2 diabetes mellitus with diabetic chronic kidney disease: Secondary | ICD-10-CM | POA: Diagnosis not present

## 2017-09-12 DIAGNOSIS — E1165 Type 2 diabetes mellitus with hyperglycemia: Secondary | ICD-10-CM

## 2017-09-12 DIAGNOSIS — Z794 Long term (current) use of insulin: Principal | ICD-10-CM

## 2017-09-12 DIAGNOSIS — N189 Chronic kidney disease, unspecified: Secondary | ICD-10-CM | POA: Diagnosis not present

## 2017-09-12 DIAGNOSIS — Z713 Dietary counseling and surveillance: Secondary | ICD-10-CM | POA: Insufficient documentation

## 2017-09-12 DIAGNOSIS — IMO0002 Reserved for concepts with insufficient information to code with codable children: Secondary | ICD-10-CM

## 2017-09-12 DIAGNOSIS — E118 Type 2 diabetes mellitus with unspecified complications: Secondary | ICD-10-CM

## 2017-09-12 NOTE — Patient Instructions (Addendum)
Goals . Go to [facility] or [facility] Three time per week.   Increase more vegetables.   Lose 3-4 lbs per month.

## 2017-09-12 NOTE — Progress Notes (Signed)
  Medical Nutrition Therapy:  Appt start time: 1100 end time: 1130 Assessment:  Primary concerns today: Diabetes. Type 2. A1C back up to 7.6% from 7.1%.  FBS:  130's. Evening 140-150's. He is eating more baked and broiled foods now. Uses Mrs. Dash for seasoning. Had CXR and it came back fine. And had an evaluation from cardiologist and it was all ok he notes. Scheduled to have eye surgery to have remove cataract left eye and putting drainage tube.  [place] 70/30 insulin 40 units in am and 50 units in pm. Metformin. Actos. Has reduced his carbs. Diet is improving. Would benefit from more fresh fruits and vegetables.     Not exercising but willing to get back walking again now that his breathing is doing better and fluid is down.  Preferred Learning Style: hands on and verbals.. Vision limited  Lab Results  Component Value Date   HGBA1C 7.6 (H) 02/11/2017    No preference indicated   Learning Readiness:     Ready  Change in progress   MEDICATIONS: See list   DIETARY INTAKE:  24-hr recall:  B ( AM): 2 boiled eggs,  Snk ( AM):   L ( PM): baked chicken and rice,  Water Snk ( PM): none D ( PM): baked chicken, string beans and mashed potatoes.  Usual physical activity: walkling a little  Estimated energy needs: 1800 calories 200 g carbohydrates 135 g protein 50 g fat  Progress Towards Goal(s):  In progress.   Nutritional Diagnosis:  NB-1.1 Food and nutrition-related knowledge deficit As related to DIabetes.  As evidenced by A1C 7.2%.    Intervention:  Nutrition and Diabetes education provided on My Plate, CHO counting, meal planning, portion sizes, timing of meals, avoiding snacks between meals unless having a low blood sugar, target ranges for A1C and blood sugars, signs/symptoms and treatment of hyper/hypoglycemia, monitoring blood sugars, taking medications as prescribed, benefits of exercising 30 minutes per day and prevention of  complications of DM.   Goals . Go to  [facility] or [facility] three time per week.   Increase more vegetables.   Lose 3-4 lbs per month.  Teaching Method Utilized:  Visual Auditory Hands on  Handouts given during visit include:  The Plate Method  Meal Plan Card   Barriers to learning/adherence to lifestyle change:  None  Demonstrated degree of understanding via:  Teach Back   Monitoring/Evaluation:  Dietary intake, exercise, meal planning, SBG, and body weight in 3 month(s).   Would he benefit from a DPP4 or GLP for needed weight loss?

## 2017-09-12 NOTE — Telephone Encounter (Signed)
noted 

## 2017-09-12 NOTE — Telephone Encounter (Signed)
Diabetic management needs updated referral to continue to see patient.  Referral placed

## 2017-09-13 ENCOUNTER — Encounter: Payer: Self-pay | Admitting: Nutrition

## 2017-10-11 ENCOUNTER — Emergency Department (HOSPITAL_COMMUNITY): Payer: Medicare HMO

## 2017-10-11 ENCOUNTER — Encounter (HOSPITAL_COMMUNITY): Payer: Self-pay | Admitting: Cardiology

## 2017-10-11 ENCOUNTER — Emergency Department (HOSPITAL_COMMUNITY)
Admission: EM | Admit: 2017-10-11 | Discharge: 2017-10-11 | Disposition: A | Discharge status: Home / Self Care | Payer: Medicare HMO | Attending: Emergency Medicine | Admitting: Emergency Medicine

## 2017-10-11 DIAGNOSIS — Z87891 Personal history of nicotine dependence: Secondary | ICD-10-CM | POA: Insufficient documentation

## 2017-10-11 DIAGNOSIS — I129 Hypertensive chronic kidney disease with stage 1 through stage 4 chronic kidney disease, or unspecified chronic kidney disease: Secondary | ICD-10-CM | POA: Diagnosis not present

## 2017-10-11 DIAGNOSIS — N181 Chronic kidney disease, stage 1: Secondary | ICD-10-CM | POA: Insufficient documentation

## 2017-10-11 DIAGNOSIS — M545 Low back pain, unspecified: Secondary | ICD-10-CM

## 2017-10-11 DIAGNOSIS — Z794 Long term (current) use of insulin: Secondary | ICD-10-CM | POA: Insufficient documentation

## 2017-10-11 DIAGNOSIS — I110 Hypertensive heart disease with heart failure: Secondary | ICD-10-CM | POA: Insufficient documentation

## 2017-10-11 DIAGNOSIS — Z7982 Long term (current) use of aspirin: Secondary | ICD-10-CM | POA: Insufficient documentation

## 2017-10-11 DIAGNOSIS — I5032 Chronic diastolic (congestive) heart failure: Secondary | ICD-10-CM | POA: Diagnosis not present

## 2017-10-11 DIAGNOSIS — I2510 Atherosclerotic heart disease of native coronary artery without angina pectoris: Secondary | ICD-10-CM | POA: Diagnosis not present

## 2017-10-11 DIAGNOSIS — E1122 Type 2 diabetes mellitus with diabetic chronic kidney disease: Secondary | ICD-10-CM | POA: Insufficient documentation

## 2017-10-11 DIAGNOSIS — Z951 Presence of aortocoronary bypass graft: Secondary | ICD-10-CM | POA: Diagnosis not present

## 2017-10-11 LAB — CBG MONITORING, ED: Glucose-Capillary: 73 mg/dL (ref 65–99)

## 2017-10-11 MED ORDER — HYDROCODONE-ACETAMINOPHEN 5-325 MG PO TABS: 1.0000 | ORAL_TABLET | Freq: Four times a day (QID) | ORAL | 0 refills | Status: DC | PRN

## 2017-10-11 NOTE — ED Triage Notes (Signed)
Left lower back pain for over a week.  Denies any injury.

## 2017-10-11 NOTE — ED Notes (Signed)
Pt c/o  Of sugar dropping, and wanted a cracker.  Nurse informed.  Snack given to Pt.

## 2017-10-11 NOTE — Discharge Instructions (Signed)
Follow up with your md next week for recheck.  Return if problems

## 2017-10-13 NOTE — ED Provider Notes (Signed)
[facility] EMERGENCY DEPARTMENT Provider Note   CSN: [id number] Arrival date & time: 10/11/17  2595     History   Chief Complaint No chief complaint on file.   HPI [name] is a 76 y.o. male.  Patient complains of lower back pain but no history of injury no fevers no chills   The history is provided by the patient. No language interpreter was used.  Back Pain   This is a new problem. The current episode started more than 2 days ago. The problem occurs constantly. The problem has not changed since onset.The pain is associated with no known injury. The pain is present in the lumbar spine. The quality of the pain is described as aching. The pain does not radiate. The pain is at a severity of 6/10. The pain is moderate. The symptoms are aggravated by twisting. Pertinent negatives include no chest pain, no headaches and no abdominal pain.    Past Medical History:  Diagnosis Date  . C. difficile diarrhea   . 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 Coronary artery disease    Status post CABG in 2007 [facility] [place])  . 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 GERD (gastroesophageal reflux disease)   . Glaucoma   . Hypertension   . Peripheral vascular disease ([place])   . Sleep apnea   . Stroke [facility]) 2008, 2014, 2015    Patient Active Problem List   Diagnosis Date Noted  . Chronic diastolic heart failure ([place]) 10/20/2015  . Peripheral edema 01/22/2015  . Tinea pedis 10/21/2014  . Prolonged Q-T interval on ECG 04/02/2014  . 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 Chronic kidney disease 11/21/2013  . Left leg weakness 09/06/2013  . CVA (cerebral infarction) 09/01/2013  . Left-sided weakness 08/22/2013  . OSA (obstructive sleep apnea) 04/12/2013  . DM (diabetes mellitus) type II controlled with renal manifestation ([place]) 11/28/2012  . PVD (peripheral vascular  disease) ([place]) 11/28/2012  . CAD (coronary artery disease) 11/28/2012  . Hyperlipidemia 11/28/2012  . Obesity 11/28/2012  . History of stroke 11/28/2012  . OAB (overactive bladder) 11/28/2012  . HTN (hypertension) 05/20/2011    Past Surgical History:  Procedure Laterality Date  . CHOLECYSTECTOMY    . CORONARY ARTERY BYPASS GRAFT    . NO PAST SURGERIES    . THYROID SURGERY         Home Medications    Prior to Admission medications   Medication Sig Start Date End Date Taking? Authorizing Provider  acetaminophen (TYLENOL) 650 MG CR tablet Take 1,300 mg by mouth every 8 (eight) hours as needed for pain.   Yes [provider]  Adalimumab 80 MG/0.8ML & 40MG /0.4ML PNKT Inject 1 Dose into the skin every 14 (fourteen) days. 09/21/17  Yes [provider]  amLODipine (NORVASC) 10 MG tablet Take 10 mg by mouth daily.   Yes [provider]  atorvastatin (LIPITOR) 80 MG tablet Take 40 mg by mouth every evening.    Yes [provider]  atropine 1 % ophthalmic solution Place 1 drop into the left eye daily.  03/28/17  Yes [provider]  brimonidine (ALPHAGAN) 0.2 % ophthalmic solution Place 1 drop into the left eye 3 (three) times daily.    Yes [provider]  dipyridamole-aspirin (AGGRENOX) 200-25 MG per 12 hr capsule Take 1 capsule by mouth 2 (two) times daily.  Yes [provider]  dorzolamide-timolol (COSOPT) 22.3-6.8 MG/ML ophthalmic solution Place 1 drop into both eyes 2 (two) times daily.   Yes [provider]  folic acid (FOLVITE) 1 MG tablet Take 1 mg by mouth. 03/14/17  Yes [provider]  furosemide (LASIX) 40 MG tablet Take 1 tablet twice a day as needed 04/13/17  Yes [place], Modena Nunnery, MD  insulin aspart protamine- aspart (NOVOLOG MIX 70/30) (70-30) 100 UNIT/ML injection Inject into the skin. Patient takes 40 units in the morning and 50 units at night.   Yes [provider]  ketorolac (ACULAR)  0.5 % ophthalmic solution Place 1 drop into the right eye 4 (four) times daily.  03/10/17  Yes [provider]  latanoprost (XALATAN) 0.005 % ophthalmic solution Place 1 drop into both eyes at bedtime.   Yes [provider]  losartan (COZAAR) 100 MG tablet Take 100 mg by mouth daily.     Yes [provider]  metFORMIN (GLUCOPHAGE) 1000 MG tablet Take 1,000 mg by mouth 2 (two) times daily with a meal.     Yes [provider]  metoprolol (LOPRESSOR) 50 MG tablet Take 0.5 tablets (25 mg total) by mouth 2 (two) times daily. FOR HEART/BLOOD PRESSURE. HOLD FOR SYSTOLIC BLOOD PRESSURE LESS THAN 1OO/HEART RATE LESS THAN 60 05/20/11  Yes Annita Brod, MD  pioglitazone (ACTOS) 30 MG tablet Take 30 mg by mouth daily.    Yes [provider]  potassium chloride (K-DUR,KLOR-CON) 10 MEQ tablet Take 2 tablets (20 mEq total) by mouth daily. Take with lasix 04/13/17  Yes [place], Modena Nunnery, MD  tamsulosin (FLOMAX) 0.4 MG CAPS capsule Take 2 capsules (0.8 mg total) by mouth every evening. 06/09/16  Yes [place], Modena Nunnery, MD  HYDROcodone-acetaminophen (NORCO/VICODIN) 5-325 MG tablet Take 1 tablet by mouth every 6 (six) hours as needed for moderate pain. 10/11/17   Milton Ferguson, MD  methotrexate (RHEUMATREX) 2.5 MG tablet Take 20 mg by mouth once a week. 06/23/17   [provider]    Family History Family History  Problem Relation Age of Onset  . Diabetes Mother   . Heart failure Mother   . Hypertension Mother   . Diabetes Father   . Heart failure Father   . Hypertension Father   . Diabetes Sister   . Heart failure Sister   . Hypertension Sister   . Hyperlipidemia Sister   . Diabetes Brother   . Heart failure Brother   . Hypertension Brother     Social History Social History   Tobacco Use  . Smoking status: Former Research scientist (life sciences)  . Smokeless tobacco: Never Used  Substance Use Topics  . Alcohol use: No  . Drug use: No     Allergies   Patient has no  known allergies.   Review of Systems Review of Systems  Constitutional: Negative for appetite change and fatigue.  HENT: Negative for congestion, ear discharge and sinus pressure.   Eyes: Negative for discharge.  Respiratory: Negative for cough.   Cardiovascular: Negative for chest pain.  Gastrointestinal: Negative for abdominal pain and diarrhea.  Genitourinary: Negative for frequency and hematuria.  Musculoskeletal: Positive for back pain.  Skin: Negative for rash.  Neurological: Negative for seizures and headaches.  Psychiatric/Behavioral: Negative for hallucinations.     Physical Exam Updated Vital Signs BP 130/64 (BP Location: Right Arm)   Pulse 61   Temp 97.9 F (36.6 C) (Oral)   Resp 16   Ht 5\' 8"  (1.727 m)  Wt 131.5 kg (290 lb)   SpO2 99%   BMI 44.09 kg/m   Physical Exam  Constitutional: He is oriented to person, place, and time. He appears well-developed.  HENT:  Head: Normocephalic.  Eyes: Conjunctivae and EOM are normal. No scleral icterus.  Neck: Neck supple. No thyromegaly present.  Cardiovascular: Normal rate and regular rhythm. Exam reveals no gallop and no friction rub.  No murmur heard. Pulmonary/Chest: No stridor. He has no wheezes. He has no rales. He exhibits no tenderness.  Abdominal: He exhibits no distension. There is no tenderness. There is no rebound.  Musculoskeletal: Normal range of motion. He exhibits no edema.  Patient has tenderness to lumbar muscles on the right side.  Worse with movement.  Straight leg raise negative.  Lymphadenopathy:    He has no cervical adenopathy.  Neurological: He is oriented to person, place, and time. He exhibits normal muscle tone. Coordination normal.  Skin: No rash noted. No erythema.  Psychiatric: He has a normal mood and affect. His behavior is normal.     ED Treatments / Results  Labs (all labs ordered are listed, but only abnormal results are displayed) Labs Reviewed  CBG MONITORING, ED     EKG  EKG Interpretation None       Radiology Dg Lumbar Spine Complete  Result Date: 10/11/2017 CLINICAL DATA:  Left lower back pain.  No injury . EXAM: LUMBAR SPINE - COMPLETE 4+ VIEW COMPARISON:  06/03/2010.  CT 02/27/2007. FINDINGS: Lumbar spine numbered with the lowest segmented appearing lumbar shaped vertebrae on lateral view as L5. Stable T12 compression fracture. No acute abnormality. Mild scoliosis concave left. Diffuse degenerative change. Aortoiliac atherosclerotic vascular calcification. Surgical clips right upper quadrant. IMPRESSION: 1. Stable T12 compression fracture. No change from multiple prior exams. 2.  No acute abnormality.  Diffuse degenerative change. 3. Aortoiliac atherosclerotic vascular calcification . Electronically Signed   By: Marcello Moores  Register   On: 10/11/2017 11:33    Procedures Procedures (including critical care time)  Medications Ordered in ED Medications - No data to display   Initial Impression / Assessment and Plan / ED Course  I have reviewed the triage vital signs and the nursing notes.  Pertinent labs & imaging results that were available during my care of the patient were reviewed by me and considered in my medical decision making (see chart for details).     X-rays show diffuse degenerative changes.  I suspect this is musculoskeletal discomfort he is given medicine to help with the discomfort and will follow up with his PCP  Final Clinical Impressions(s) / ED Diagnoses   Final diagnoses:  Back pain at L4-L5 level    ED Discharge Orders        Ordered    HYDROcodone-acetaminophen (NORCO/VICODIN) 5-325 MG tablet  Every 6 hours PRN     10/11/17 1335       Milton Ferguson, MD 10/13/17 1102

## 2017-10-17 ENCOUNTER — Encounter: Payer: Self-pay | Admitting: Family Medicine

## 2017-10-17 ENCOUNTER — Other Ambulatory Visit: Payer: Self-pay

## 2017-10-17 ENCOUNTER — Ambulatory Visit (INDEPENDENT_AMBULATORY_CARE_PROVIDER_SITE_OTHER): Payer: Medicare HMO | Admitting: Family Medicine

## 2017-10-17 VITALS — BP 132/68 | HR 60 | Temp 98.6°F | Resp 16 | Ht 68.0 in | Wt 293.0 lb

## 2017-10-17 DIAGNOSIS — Z794 Long term (current) use of insulin: Secondary | ICD-10-CM | POA: Diagnosis not present

## 2017-10-17 DIAGNOSIS — E1122 Type 2 diabetes mellitus with diabetic chronic kidney disease: Secondary | ICD-10-CM

## 2017-10-17 DIAGNOSIS — I10 Essential (primary) hypertension: Secondary | ICD-10-CM | POA: Diagnosis not present

## 2017-10-17 DIAGNOSIS — M5136 Other intervertebral disc degeneration, lumbar region: Secondary | ICD-10-CM

## 2017-10-17 DIAGNOSIS — Z6841 Body Mass Index (BMI) 40.0 and over, adult: Secondary | ICD-10-CM | POA: Diagnosis not present

## 2017-10-17 DIAGNOSIS — R609 Edema, unspecified: Secondary | ICD-10-CM | POA: Diagnosis not present

## 2017-10-17 DIAGNOSIS — E66813 Obesity, class 3: Secondary | ICD-10-CM

## 2017-10-17 DIAGNOSIS — D649 Anemia, unspecified: Secondary | ICD-10-CM | POA: Diagnosis not present

## 2017-10-17 DIAGNOSIS — E782 Mixed hyperlipidemia: Secondary | ICD-10-CM | POA: Diagnosis not present

## 2017-10-17 DIAGNOSIS — N183 Chronic kidney disease, stage 3 unspecified: Secondary | ICD-10-CM

## 2017-10-17 DIAGNOSIS — R600 Localized edema: Secondary | ICD-10-CM

## 2017-10-17 DIAGNOSIS — M51369 Other intervertebral disc degeneration, lumbar region without mention of lumbar back pain or lower extremity pain: Secondary | ICD-10-CM

## 2017-10-17 MED ORDER — FUROSEMIDE 40 MG PO TABS: ORAL_TABLET | ORAL | 3 refills | Status: DC

## 2017-10-17 MED ORDER — POTASSIUM CHLORIDE CRYS ER 10 MEQ PO TBCR: 20.0000 meq | EXTENDED_RELEASE_TABLET | Freq: Every day | ORAL | 2 refills | Status: DC

## 2017-10-17 NOTE — Patient Instructions (Signed)
F/U 4 months  We will call with lab results  

## 2017-10-17 NOTE — Progress Notes (Signed)
   Subjective:    Patient ID: [name], male    DOB: Mar 20, 1941, 76 y.o.   MRN: [id number]  Patient presents for Follow-up (is fasting)   Pt here to f/u chronic medical problems     Right eye surgery was post poned due to the snow, has appt tomorrow at [facility]   Back pain- seen in ER last week due to back pain, Xray showed severe DDD and old T12 compression fracture , pain was mostly left lower back, non radiating,no change in bowel or bladder, no falls Reviewed ER note, given norco, he never took, taking tylenol instead   OSA- supplies from [place]   DM- Last A1C  7.35 in July, LDL at goal 54, on Lipitor     70/30 40 units in AM and  50 units at bedtime   Left leg swelling more he has not been taking his water pill,, but wears his compression hose       Review Of Systems:  GEN- denies fatigue, fever, weight loss,weakness, recent illness HEENT- denies eye drainage, change in vision, nasal discharge, CVS- denies chest pain, palpitations RESP- denies SOB, cough, wheeze ABD- denies N/V, change in stools, abd pain GU- denies dysuria, hematuria, dribbling, incontinence MSK- +joint pain, muscle aches, injury Neuro- denies headache, dizziness, syncope, seizure activity       Objective:    BP 132/68   Pulse 60   Temp 98.6 F (37 C) (Oral)   Resp 16   Ht 5\' 8"  (1.727 m)   Wt 293 lb (132.9 kg)   SpO2 96%   BMI 44.55 kg/m  GEN- NAD, alert and oriented x3,obese HEENT- PERRL, EOMI, non injected sclera, pink conjunctiva, MMM, oropharynx clear Neck- Supple, no tjvd CVS- RRR, no murmur RESP-CTAB ABD-NABS,soft,NT,ND MSK-  Spine NT, fair ROM, neg SLR bilat  EXT-  1+ edema Left leg- non pitting, trace right  Pulses- Radial,2+ DP 1+        Assessment & Plan:      Problem List Items Addressed This Visit      Unprioritized   Obesity   Hyperlipidemia   Relevant Medications   furosemide (LASIX) 40 MG tablet   Peripheral edema    Restart lasix 40mg  daily with  potassium, cardiology recommend he continue       HTN (hypertension) - Primary (Chronic)    Controlled no changes      Relevant Medications   furosemide (LASIX) 40 MG tablet   Other Relevant Orders   CBC with Differential/Platelet   Comprehensive metabolic panel   Lipid panel   DM (diabetes mellitus) type II controlled with renal manifestation (HCC)    Controlled for age, recheck A1C, no meter today, no hypoglycemia symptoms       Relevant Orders   Lipid panel   Hemoglobin A1c   DDD (degenerative disc disease), lumbar    Discussed trying PT, he wants to hold off Discussed his increasing weight also causes worsening pain Continue tylenol as needed         Note: This dictation was prepared with Dragon dictation along with smaller phrase technology. Any transcriptional errors that result from this process are unintentional.

## 2017-10-17 NOTE — Assessment & Plan Note (Signed)
Controlled for age, recheck A1C, no meter today, no hypoglycemia symptoms

## 2017-10-17 NOTE — Assessment & Plan Note (Signed)
Restart lasix 40mg  daily with potassium, cardiology recommend he continue

## 2017-10-17 NOTE — Assessment & Plan Note (Signed)
Controlled no changes 

## 2017-10-17 NOTE — Assessment & Plan Note (Signed)
Discussed trying PT, he wants to hold off Discussed his increasing weight also causes worsening pain Continue tylenol as needed

## 2017-10-20 LAB — CBC WITH DIFFERENTIAL/PLATELET
Basophils Absolute: 21 cells/uL (ref 0–200)
Basophils Relative: 0.6 %
Eosinophils Absolute: 70 cells/uL (ref 15–500)
Eosinophils Relative: 2 %
HCT: 33.1 % — ABNORMAL LOW (ref 38.5–50.0)
Hemoglobin: 10.8 g/dL — ABNORMAL LOW (ref 13.2–17.1)
Lymphs Abs: 1449 cells/uL (ref 850–3900)
MCH: 31.5 pg (ref 27.0–33.0)
MCHC: 32.6 g/dL (ref 32.0–36.0)
MCV: 96.5 fL (ref 80.0–100.0)
MPV: 10.2 fL (ref 7.5–12.5)
Monocytes Relative: 6.9 %
Neutro Abs: 1719 cells/uL (ref 1500–7800)
Neutrophils Relative %: 49.1 %
Platelets: 188 10*3/uL (ref 140–400)
RBC: 3.43 10*6/uL — ABNORMAL LOW (ref 4.20–5.80)
RDW: 14.4 % (ref 11.0–15.0)
Total Lymphocyte: 41.4 %
WBC mixed population: 242 cells/uL (ref 200–950)
WBC: 3.5 10*3/uL — ABNORMAL LOW (ref 3.8–10.8)

## 2017-10-20 LAB — COMPREHENSIVE METABOLIC PANEL
AG Ratio: 1.4 (calc) (ref 1.0–2.5)
ALT: 33 U/L (ref 9–46)
AST: 16 U/L (ref 10–35)
Albumin: 4.1 g/dL (ref 3.6–5.1)
Alkaline phosphatase (APISO): 37 U/L — ABNORMAL LOW (ref 40–115)
BUN/Creatinine Ratio: 16 (calc) (ref 6–22)
BUN: 19 mg/dL (ref 7–25)
CO2: 26 mmol/L (ref 20–32)
Calcium: 9 mg/dL (ref 8.6–10.3)
Chloride: 106 mmol/L (ref 98–110)
Creat: 1.21 mg/dL — ABNORMAL HIGH (ref 0.70–1.18)
Globulin: 2.9 g/dL (calc) (ref 1.9–3.7)
Glucose, Bld: 89 mg/dL (ref 65–99)
Potassium: 4.2 mmol/L (ref 3.5–5.3)
Sodium: 139 mmol/L (ref 135–146)
Total Bilirubin: 0.9 mg/dL (ref 0.2–1.2)
Total Protein: 7 g/dL (ref 6.1–8.1)

## 2017-10-20 LAB — LIPID PANEL
Cholesterol: 115 mg/dL (ref <200)
HDL: 51 mg/dL (ref >40)
LDL Cholesterol (Calc): 46 mg/dL (calc)
Non-HDL Cholesterol (Calc): 64 mg/dL (calc) (ref <130)
Total CHOL/HDL Ratio: 2.3 (calc) (ref <5.0)
Triglycerides: 98 mg/dL (ref <150)

## 2017-10-20 LAB — HEMOGLOBIN A1C
Hgb A1c MFr Bld: 6.8 % of total Hgb — ABNORMAL HIGH (ref <5.7)
Mean Plasma Glucose: 148 (calc)
eAG (mmol/L): 8.2 (calc)

## 2017-10-20 LAB — IRON,TIBC AND FERRITIN PANEL
%SAT: 58 % (calc) (ref 15–60)
Ferritin: 120 ng/mL (ref 20–380)
Iron: 157 ug/dL (ref 50–180)
TIBC: 272 mcg/dL (calc) (ref 250–425)

## 2017-10-20 LAB — TEST AUTHORIZATION

## 2017-11-16 ENCOUNTER — Telehealth: Payer: Self-pay | Admitting: Cardiology

## 2017-11-16 NOTE — Telephone Encounter (Signed)
I will find out more information on this. [employer], got voicemail. Left message for her to return call. I also faxed her to find out more information. Will let Dr. Domenic Polite know once she responds/returns call.

## 2017-11-16 NOTE — Telephone Encounter (Signed)
Per Sharl Ma from [place]. --pt is scheduled for surgery on Feb 12, she'd like to know when pt needs to hold his dipyridamole-aspirin (AGGRENOX) 200-25 MG per 12 hr capsule [[id number]]  For one week. Please send a fax to [phone] giving clearance, Davy Pique can be reached @ [phone]

## 2017-11-16 NOTE — Telephone Encounter (Signed)
Will forward to Dr. McDowell 

## 2017-11-16 NOTE — Telephone Encounter (Signed)
I cannot answer this question. What kind of surgery is proposed? Does the ophthalmologist need for Aggrenox to be stopped in order to do the surgery?

## 2017-12-02 ENCOUNTER — Encounter: Payer: Self-pay | Admitting: Family Medicine

## 2017-12-02 ENCOUNTER — Other Ambulatory Visit: Payer: Self-pay

## 2017-12-02 ENCOUNTER — Ambulatory Visit (INDEPENDENT_AMBULATORY_CARE_PROVIDER_SITE_OTHER): Payer: Medicare HMO | Admitting: Family Medicine

## 2017-12-02 VITALS — BP 134/62 | HR 62 | Temp 97.8°F | Resp 16 | Ht 68.0 in | Wt 299.0 lb

## 2017-12-02 DIAGNOSIS — R21 Rash and other nonspecific skin eruption: Secondary | ICD-10-CM

## 2017-12-02 MED ORDER — CLOTRIMAZOLE-BETAMETHASONE 1-0.05 % EX CREA: 1.0000 "application " | TOPICAL_CREAM | Freq: Two times a day (BID) | CUTANEOUS | 1 refills | Status: DC

## 2017-12-02 NOTE — Patient Instructions (Signed)
Use the cream twice a day  Moisturize  F/U as previous

## 2017-12-02 NOTE — Progress Notes (Signed)
   Subjective:    Patient ID: [name], male    DOB: [birth year], 77 y.o.   MRN: [id number]  Patient presents for Rash (bumpy irritation to L elbow and B back of knees- reports area itches)   Few weeks he noticed some bumps on his left elbow, and back ofknees, very itchy on elbow He does sit in his lift chair most of the day. No change in soap, lotion, detergent Does not seem to be spreading No sick contacts  No reecent illness He does not moisturize     Review Of Systems:  GEN- denies fatigue, fever, weight loss,weakness, recent illness HEENT- denies eye drainage, change in vision, nasal discharge, CVS- denies chest pain, palpitations RESP- denies SOB, cough, wheeze ABD- denies N/V, change in stools, abd pain GU- denies dysuria, hematuria, dribbling, incontinence MSK- denies joint pain, muscle aches, injury Neuro- denies headache, dizziness, syncope, seizure activity       Objective:    BP 134/62   Pulse 62   Temp 97.8 F (36.6 C) (Oral)   Resp 16   Ht 5\' 8"  (1.727 m)   Wt 299 lb (135.6 kg)   SpO2 100%   BMI 45.46 kg/m  GEN- NAD, alert and oriented x3 CVS- RRR, no murmur RESP-CTAB sKIN- grouped dry maculopapular fleshed toned on left elbow, few scabs, scabed lesions left popliteal region, no erythema EXT- chronic  Edema L>r Pulses- Radial  2+        Assessment & Plan:      Problem List Items Addressed This Visit    None    Visit Diagnoses    Skin rash    -  Primary   unknown cause, but seems to be at areas that rub. As he is diabetic, and has dry skin, will try lotrisone cream, call if not improved may need to biopsy      Note: This dictation was prepared with Dragon dictation along with smaller phrase technology. Any transcriptional errors that result from this process are unintentional.

## 2017-12-13 ENCOUNTER — Ambulatory Visit: Payer: Medicare Other | Admitting: Nutrition

## 2018-02-15 ENCOUNTER — Ambulatory Visit (INDEPENDENT_AMBULATORY_CARE_PROVIDER_SITE_OTHER): Payer: Medicare HMO | Admitting: Family Medicine

## 2018-02-15 ENCOUNTER — Other Ambulatory Visit: Payer: Self-pay

## 2018-02-15 ENCOUNTER — Encounter: Payer: Self-pay | Admitting: Family Medicine

## 2018-02-15 VITALS — BP 130/84 | HR 70 | Temp 98.9°F | Resp 14 | Ht 68.0 in | Wt 292.0 lb

## 2018-02-15 DIAGNOSIS — I2510 Atherosclerotic heart disease of native coronary artery without angina pectoris: Secondary | ICD-10-CM

## 2018-02-15 DIAGNOSIS — R0609 Other forms of dyspnea: Secondary | ICD-10-CM

## 2018-02-15 DIAGNOSIS — G4733 Obstructive sleep apnea (adult) (pediatric): Secondary | ICD-10-CM

## 2018-02-15 DIAGNOSIS — N183 Chronic kidney disease, stage 3 unspecified: Secondary | ICD-10-CM

## 2018-02-15 DIAGNOSIS — Z794 Long term (current) use of insulin: Secondary | ICD-10-CM

## 2018-02-15 DIAGNOSIS — E1122 Type 2 diabetes mellitus with diabetic chronic kidney disease: Secondary | ICD-10-CM

## 2018-02-15 DIAGNOSIS — I5032 Chronic diastolic (congestive) heart failure: Secondary | ICD-10-CM

## 2018-02-15 DIAGNOSIS — R0600 Dyspnea, unspecified: Secondary | ICD-10-CM

## 2018-02-15 NOTE — Assessment & Plan Note (Signed)
Blood pressure is controlled lipids at goal

## 2018-02-15 NOTE — Assessment & Plan Note (Signed)
Diabetes has been controlled in the past few months last A1c at 6.8%.  He has not been checking regularly will be checked today.

## 2018-02-15 NOTE — Progress Notes (Signed)
Subjective:    Patient ID: [name], male    DOB: 11-Aug-1941, 77 y.o.   MRN: [id number]  Patient presents for Follow-up (is not fasting)   Pt here to f/u chronic medical problems  DM- last A1C 6.8%, he does feel like his blood sugar gets low at times if he does not eat enough.  He is not been checking his CBGs. That he is taking his medication   Had left eye cataract surgery, but then when stitches came out had blurry vision, had steroid shot placed last last Thursday , has f/u Friday with eye specialist- [place] gets SOB-evaluated by cardiology he does have chronic diastolic heart failure but his ejection fraction was preserved on his echo in the fall 2018.  He states that he is taking his diuretic regularly.  But he gets short of breath with the smallest activities such as getting up to get dressed or take a shower moving around the home.  He actually sits most of the day in his recliner.  He is also had some cramps in his legs and hands for the past month or so.  He does eat mustard to help.  He has been trying to drink more water as well when he has the cramps.      Review Of Systems:  GEN- denies fatigue, fever, weight loss,weakness, recent illness HEENT- denies eye drainage, change in vision, nasal discharge, CVS- denies chest pain, palpitations RESP- + SOB, DENIES cough, wheeze ABD- denies N/V, change in stools, abd pain GU- denies dysuria, hematuria, dribbling, incontinence MSK- denies joint pain,+ muscle aches, injury Neuro- denies headache, dizziness, syncope, seizure activity       Objective:    BP 130/84   Pulse 70   Temp 98.9 F (37.2 C) (Oral)   Resp 14   Ht 5\' 8"  (1.727 m)   Wt 292 lb (132.5 kg)   SpO2 97%   BMI 44.40 kg/m  GEN- NAD, alert and oriented x3 HEENT- PERRL, EOMI, non injected sclera, pink conjunctiva, MMM, oropharynx clear Neck- Supple, no JVD CVS- RRR, no murmur RESP-CTAB, 2 minute walk, SOB, Oxygen sat 96%, HR  80's ABD-NABS,soft,NT,ND EXT- Left chronic edema > trace on right LE Pulses- Radial 2+        Assessment & Plan:      Problem List Items Addressed This Visit      Unprioritized   Chronic kidney disease   Relevant Orders   Comprehensive metabolic panel   OSA (obstructive sleep apnea)   DM (diabetes mellitus) type II controlled with renal manifestation (HCC)    Diabetes has been controlled in the past few months last A1c at 6.8%.  He has not been checking regularly will be checked today.      Relevant Orders   Hemoglobin A1c   Chronic diastolic heart failure ([facility])    Having worsening shortness of breath we cannot pinpoint specific cause besides significant deconditioning in the setting of all his other medical problems.  He is oxygenating at night with his CPAP for his obstructive sleep apnea.  See if I can get a daytime oxygen testing done to see if he qualifies for daytime oxygen as he is having spells where he is getting hypoxic.  He is already seen cardiology is also followed at the [place].      Relevant Orders   Brain natriuretic peptide   CAD (coronary artery disease)    Blood pressure is controlled lipids at goal  Relevant Orders   CBC with Differential/Platelet    Other Visit Diagnoses    DOE (dyspnea on exertion)    -  Primary   Check BNP , but right now  i will not to change his diuretic as he does not seem fluid overloaded   Relevant Orders   Brain natriuretic peptide      Note: This dictation was prepared with Dragon dictation along with smaller phrase technology. Any transcriptional errors that result from this process are unintentional.

## 2018-02-15 NOTE — Patient Instructions (Signed)
F/U 3 months Oxygen test to be done

## 2018-02-15 NOTE — Assessment & Plan Note (Signed)
Having worsening shortness of breath we cannot pinpoint specific cause besides significant deconditioning in the setting of all his other medical problems.  He is oxygenating at night with his CPAP for his obstructive sleep apnea.  See if I can get a daytime oxygen testing done to see if he qualifies for daytime oxygen as he is having spells where he is getting hypoxic.  He is already seen cardiology is also followed at the [place].

## 2018-02-16 LAB — CBC WITH DIFFERENTIAL/PLATELET
Basophils Absolute: 29 cells/uL (ref 0–200)
Basophils Relative: 0.7 %
Eosinophils Absolute: 71 cells/uL (ref 15–500)
Eosinophils Relative: 1.7 %
HCT: 30.7 % — ABNORMAL LOW (ref 38.5–50.0)
Hemoglobin: 10.2 g/dL — ABNORMAL LOW (ref 13.2–17.1)
Lymphs Abs: 1021 cells/uL (ref 850–3900)
MCH: 32 pg (ref 27.0–33.0)
MCHC: 33.2 g/dL (ref 32.0–36.0)
MCV: 96.2 fL (ref 80.0–100.0)
MPV: 10.2 fL (ref 7.5–12.5)
Monocytes Relative: 13.2 %
Neutro Abs: 2524 cells/uL (ref 1500–7800)
Neutrophils Relative %: 60.1 %
Platelets: 254 10*3/uL (ref 140–400)
RBC: 3.19 10*6/uL — ABNORMAL LOW (ref 4.20–5.80)
RDW: 14.4 % (ref 11.0–15.0)
Total Lymphocyte: 24.3 %
WBC mixed population: 554 cells/uL (ref 200–950)
WBC: 4.2 10*3/uL (ref 3.8–10.8)

## 2018-02-16 LAB — COMPREHENSIVE METABOLIC PANEL
AG Ratio: 1.4 (calc) (ref 1.0–2.5)
ALT: 13 U/L (ref 9–46)
AST: 13 U/L (ref 10–35)
Albumin: 3.8 g/dL (ref 3.6–5.1)
Alkaline phosphatase (APISO): 56 U/L (ref 40–115)
BUN/Creatinine Ratio: 11 (calc) (ref 6–22)
BUN: 16 mg/dL (ref 7–25)
CO2: 24 mmol/L (ref 20–32)
Calcium: 8.6 mg/dL (ref 8.6–10.3)
Chloride: 102 mmol/L (ref 98–110)
Creat: 1.41 mg/dL — ABNORMAL HIGH (ref 0.70–1.18)
Globulin: 2.7 g/dL (calc) (ref 1.9–3.7)
Glucose, Bld: 163 mg/dL — ABNORMAL HIGH (ref 65–99)
Potassium: 4.3 mmol/L (ref 3.5–5.3)
Sodium: 137 mmol/L (ref 135–146)
Total Bilirubin: 0.7 mg/dL (ref 0.2–1.2)
Total Protein: 6.5 g/dL (ref 6.1–8.1)

## 2018-02-16 LAB — HEMOGLOBIN A1C
Hgb A1c MFr Bld: 7.2 % of total Hgb — ABNORMAL HIGH (ref <5.7)
Mean Plasma Glucose: 160 (calc)
eAG (mmol/L): 8.9 (calc)

## 2018-02-16 LAB — BRAIN NATRIURETIC PEPTIDE: Brain Natriuretic Peptide: 87 pg/mL (ref <100)

## 2018-02-19 ENCOUNTER — Emergency Department (HOSPITAL_COMMUNITY): Payer: Medicare HMO

## 2018-02-19 ENCOUNTER — Emergency Department (HOSPITAL_COMMUNITY)
Admission: EM | Admit: 2018-02-19 | Discharge: 2018-02-19 | Disposition: A | Discharge status: Home / Self Care | Payer: Medicare HMO | Attending: Emergency Medicine | Admitting: Emergency Medicine

## 2018-02-19 ENCOUNTER — Encounter (HOSPITAL_COMMUNITY): Payer: Self-pay | Admitting: Emergency Medicine

## 2018-02-19 ENCOUNTER — Other Ambulatory Visit: Payer: Self-pay

## 2018-02-19 DIAGNOSIS — N181 Chronic kidney disease, stage 1: Secondary | ICD-10-CM | POA: Insufficient documentation

## 2018-02-19 DIAGNOSIS — R0609 Other forms of dyspnea: Secondary | ICD-10-CM | POA: Diagnosis present

## 2018-02-19 DIAGNOSIS — Z951 Presence of aortocoronary bypass graft: Secondary | ICD-10-CM | POA: Diagnosis not present

## 2018-02-19 DIAGNOSIS — I130 Hypertensive heart and chronic kidney disease with heart failure and stage 1 through stage 4 chronic kidney disease, or unspecified chronic kidney disease: Secondary | ICD-10-CM | POA: Diagnosis not present

## 2018-02-19 DIAGNOSIS — Z7984 Long term (current) use of oral hypoglycemic drugs: Secondary | ICD-10-CM | POA: Diagnosis not present

## 2018-02-19 DIAGNOSIS — R05 Cough: Secondary | ICD-10-CM | POA: Diagnosis not present

## 2018-02-19 DIAGNOSIS — R600 Localized edema: Secondary | ICD-10-CM | POA: Diagnosis not present

## 2018-02-19 DIAGNOSIS — R0602 Shortness of breath: Secondary | ICD-10-CM | POA: Diagnosis not present

## 2018-02-19 DIAGNOSIS — E1122 Type 2 diabetes mellitus with diabetic chronic kidney disease: Secondary | ICD-10-CM | POA: Diagnosis not present

## 2018-02-19 DIAGNOSIS — Z79899 Other long term (current) drug therapy: Secondary | ICD-10-CM | POA: Insufficient documentation

## 2018-02-19 DIAGNOSIS — Z9114 Patient's other noncompliance with medication regimen: Secondary | ICD-10-CM | POA: Diagnosis not present

## 2018-02-19 DIAGNOSIS — R2243 Localized swelling, mass and lump, lower limb, bilateral: Secondary | ICD-10-CM | POA: Insufficient documentation

## 2018-02-19 DIAGNOSIS — I10 Essential (primary) hypertension: Secondary | ICD-10-CM | POA: Diagnosis not present

## 2018-02-19 DIAGNOSIS — I2510 Atherosclerotic heart disease of native coronary artery without angina pectoris: Secondary | ICD-10-CM | POA: Diagnosis not present

## 2018-02-19 DIAGNOSIS — Z87891 Personal history of nicotine dependence: Secondary | ICD-10-CM | POA: Insufficient documentation

## 2018-02-19 DIAGNOSIS — I5032 Chronic diastolic (congestive) heart failure: Secondary | ICD-10-CM | POA: Diagnosis not present

## 2018-02-19 DIAGNOSIS — R0600 Dyspnea, unspecified: Secondary | ICD-10-CM

## 2018-02-19 LAB — CBC WITH DIFFERENTIAL/PLATELET
Basophils Absolute: 0 10*3/uL (ref 0.0–0.1)
Basophils Relative: 0 %
Eosinophils Absolute: 0 10*3/uL (ref 0.0–0.7)
Eosinophils Relative: 1 %
HCT: 30.1 % — ABNORMAL LOW (ref 39.0–52.0)
Hemoglobin: 9.6 g/dL — ABNORMAL LOW (ref 13.0–17.0)
Lymphocytes Relative: 17 %
Lymphs Abs: 0.8 10*3/uL (ref 0.7–4.0)
MCH: 31.8 pg (ref 26.0–34.0)
MCHC: 31.9 g/dL (ref 30.0–36.0)
MCV: 99.7 fL (ref 78.0–100.0)
Monocytes Absolute: 0.2 10*3/uL (ref 0.1–1.0)
Monocytes Relative: 5 %
Neutro Abs: 3.4 10*3/uL (ref 1.7–7.7)
Neutrophils Relative %: 77 %
Platelets: 262 10*3/uL (ref 150–400)
RBC: 3.02 MIL/uL — ABNORMAL LOW (ref 4.22–5.81)
RDW: 15.2 % (ref 11.5–15.5)
WBC: 4.5 10*3/uL (ref 4.0–10.5)

## 2018-02-19 LAB — BASIC METABOLIC PANEL
Anion gap: 12 (ref 5–15)
BUN: 19 mg/dL (ref 6–20)
CO2: 22 mmol/L (ref 22–32)
Calcium: 8.5 mg/dL — ABNORMAL LOW (ref 8.9–10.3)
Chloride: 105 mmol/L (ref 101–111)
Creatinine, Ser: 1.26 mg/dL — ABNORMAL HIGH (ref 0.61–1.24)
GFR calc Af Amer: >60 mL/min (ref >60)
GFR calc non Af Amer: 54 mL/min — ABNORMAL LOW (ref >60)
Glucose, Bld: 196 mg/dL — ABNORMAL HIGH (ref 65–99)
Potassium: 4.3 mmol/L (ref 3.5–5.1)
Sodium: 139 mmol/L (ref 135–145)

## 2018-02-19 LAB — TROPONIN I: Troponin I: <0.03 ng/mL (ref <0.03)

## 2018-02-19 LAB — BRAIN NATRIURETIC PEPTIDE: B Natriuretic Peptide: 62 pg/mL (ref 0.0–100.0)

## 2018-02-19 MED ORDER — ACETAMINOPHEN 325 MG PO TABS
650.0000 mg | ORAL_TABLET | Freq: Once | ORAL | Status: AC
  Administered 2018-02-19: 650 mg via ORAL
  Filled 2018-02-19: qty 2

## 2018-02-19 MED ORDER — FUROSEMIDE 40 MG PO TABS
20.0000 mg | ORAL_TABLET | Freq: Once | ORAL | Status: AC
  Administered 2018-02-19: 20 mg via ORAL
  Filled 2018-02-19: qty 1

## 2018-02-19 MED ORDER — FUROSEMIDE 20 MG PO TABS: 20.0000 mg | ORAL_TABLET | Freq: Every day | ORAL | 0 refills | Status: DC

## 2018-02-19 NOTE — ED Triage Notes (Signed)
Pt reports progressive shortness of breath for the past few days.  States his left leg is more swollen, but denies other swelling.  Denies pain.  States he has been urinating more than normal, especially at night.

## 2018-02-19 NOTE — Discharge Instructions (Addendum)
Your evaluated in the emergency department for worsening shortness of breath.  We found that you were slightly more anemic than normal but otherwise no obvious cause for your shortness of breath.  We are prescribing you a fluid pill that your doctor had wanted you on in the past that you are taking.  You need to call your primary care doctor tomorrow and arrange close follow-up.  You should return to the emergency department if any worsening symptoms.

## 2018-02-19 NOTE — ED Notes (Signed)
Patient tolerated ambulation well, oxygen saturations maintained 98% or higher.

## 2018-02-19 NOTE — ED Notes (Signed)
Report from Bryan, RN

## 2018-02-19 NOTE — ED Provider Notes (Signed)
[facility] EMERGENCY DEPARTMENT Provider Note   CSN: [id number] Arrival date & time: 02/19/18  0815     History   Chief Complaint Chief Complaint  Patient presents with  . Shortness of Breath    HPI [name] is a 77 y.o. male.  He is a history of CAD CHF diabetes with at least a years worth of shortness of breath.  He seen his doctor multiple times for this.  It sounds like over the last week or 2 the shortness of breath has become more progressive and is having difficulty ambulating for any length of time.  It is not associated with any chest pain.  He states his weights are about baseline.  He does carry some fluids in his abdomen and his legs and his left leg is always more swollen than the right due to the vein harvest from his CABG.  There is no leg pain.  He is got a dry cough and there is been no fever.  He is supposed to be on a diuretic prescribed by Dr. Buelah Manis but he states it makes him urinate too much so he is only taking spironolactone.  The diuretic that he is not taking appears to be Lasix 40 mg twice a day as needed.  He is able to lie flat at night and uses CPAP at night.  The history is provided by the patient and the spouse.  Shortness of Breath  This is a chronic problem. The current episode started more than 1 week ago. The problem has been gradually worsening. Associated symptoms include cough and leg swelling. Pertinent negatives include no fever, no rhinorrhea, no sore throat, no ear pain, no sputum production, no hemoptysis, no wheezing, no PND, no orthopnea, no chest pain, no vomiting, no abdominal pain, no rash, no leg pain and no claudication. It is unknown what precipitated the problem. He has tried nothing for the symptoms. The treatment provided no relief. Associated medical issues include CAD, heart failure and past MI.    Past Medical History:  Diagnosis Date  . C. difficile diarrhea   . 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 Coronary artery disease    Status post CABG in 2007 [facility] system [facility])  . 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 GERD (gastroesophageal reflux disease)   . Glaucoma   . Hypertension   . Peripheral vascular disease ([place])   . Sleep apnea   . Stroke [facility]) 2008, 2014, 2015    Patient Active Problem List   Diagnosis Date Noted  . DDD (degenerative disc disease), lumbar 10/17/2017  . Chronic diastolic heart failure ([place]) 10/20/2015  . Peripheral edema 01/22/2015  . Tinea pedis 10/21/2014  . Prolonged Q-T interval on ECG 04/02/2014  . 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 Chronic kidney disease 11/21/2013  . Left leg weakness 09/06/2013  . CVA (cerebral infarction) 09/01/2013  . Left-sided weakness 08/22/2013  . OSA (obstructive sleep apnea) 04/12/2013  . DM (diabetes mellitus) type II controlled with renal manifestation ([place]) 11/28/2012  . PVD (peripheral vascular disease) ([place]) 11/28/2012  . CAD (coronary artery disease) 11/28/2012  . Hyperlipidemia 11/28/2012  . Obesity 11/28/2012  . History of stroke 11/28/2012  . OAB (overactive bladder) 11/28/2012  . HTN (hypertension) 05/20/2011    Past Surgical History:  Procedure Laterality Date  . CHOLECYSTECTOMY    . CORONARY ARTERY BYPASS  GRAFT    . NO PAST SURGERIES    . THYROID SURGERY          Home Medications    Prior to Admission medications   Medication Sig Start Date End Date Taking? Authorizing Provider  acetaminophen (TYLENOL) 650 MG CR tablet Take 1,300 mg by mouth every 8 (eight) hours as needed for pain.    [provider]  Adalimumab 80 MG/0.8ML & 40MG /0.4ML PNKT Inject 1 Dose into the skin every 14 (fourteen) days. 09/21/17   [provider]  amLODipine (NORVASC) 10 MG tablet Take 10 mg by mouth daily.    [provider]  atorvastatin (LIPITOR) 80 MG tablet Take 40 mg by mouth every evening.      [provider]  atropine 1 % ophthalmic solution Place 1 drop into the left eye daily.  03/28/17   [provider]  brimonidine (ALPHAGAN) 0.2 % ophthalmic solution Place 1 drop into the left eye 3 (three) times daily.     [provider]  clotrimazole-betamethasone (LOTRISONE) cream Apply 1 application topically 2 (two) times daily. 12/02/17   Goff, Modena Nunnery, MD  dipyridamole-aspirin [facility]) 200-25 MG per 12 hr capsule Take 1 capsule by mouth 2 (two) times daily.    [provider]  dorzolamide-timolol (COSOPT) 22.3-6.8 MG/ML ophthalmic solution Place 1 drop into both eyes 2 (two) times daily.    [provider]  folic acid (FOLVITE) 1 MG tablet Take 1 mg by mouth. 03/14/17   [provider]  furosemide (LASIX) 40 MG tablet Take 1 tablet twice a day as needed 10/17/17   Alycia Rossetti, MD  insulin aspart protamine- aspart (NOVOLOG MIX 70/30) (70-30) 100 UNIT/ML injection Inject into the skin. Patient takes 40 units in the morning and 50 units at night.    [provider]  ketorolac (ACULAR) 0.5 % ophthalmic solution Place 1 drop into the right eye 4 (four) times daily.  03/10/17   [provider]  losartan (COZAAR) 100 MG tablet Take 100 mg by mouth daily.      [provider]  metFORMIN (GLUCOPHAGE) 1000 MG tablet Take 1,000 mg by mouth 2 (two) times daily with a meal.      [provider]  methotrexate (RHEUMATREX) 2.5 MG tablet Take 20 mg by mouth once a week. 06/23/17   [provider]  metoprolol (LOPRESSOR) 50 MG tablet Take 0.5 tablets (25 mg total) by mouth 2 (two) times daily. FOR HEART/BLOOD PRESSURE. HOLD FOR SYSTOLIC BLOOD PRESSURE LESS THAN 1OO/HEART RATE LESS THAN 60 05/20/11   Annita Brod, MD  pioglitazone (ACTOS) 30 MG tablet Take 30 mg by mouth daily.     [provider]  potassium chloride (K-DUR,KLOR-CON) 10 MEQ tablet Take 2 tablets (20 mEq total) by mouth daily.  Take with lasix 10/17/17   Alycia Rossetti, MD  tamsulosin (FLOMAX) 0.4 MG CAPS capsule Take 2 capsules (0.8 mg total) by mouth every evening. 06/09/16   Alycia Rossetti, MD    Family History Family History  Problem Relation Age of Onset  . Diabetes Mother   . Heart failure Mother   . Hypertension Mother   . Diabetes Father   . Heart failure Father   . Hypertension Father   . Diabetes Sister   . Heart failure Sister   . Hypertension Sister   . Hyperlipidemia Sister   . Diabetes Brother   . Heart failure Brother   . Hypertension Brother  Social History Social History   Tobacco Use  . Smoking status: Former Research scientist (life sciences)  . Smokeless tobacco: Never Used  Substance Use Topics  . Alcohol use: No  . Drug use: No     Allergies   Patient has no known allergies.   Review of Systems Review of Systems  Constitutional: Negative for chills and fever.  HENT: Negative for ear pain, rhinorrhea and sore throat.   Eyes: Negative for pain and visual disturbance.  Respiratory: Positive for cough and shortness of breath. Negative for hemoptysis, sputum production and wheezing.   Cardiovascular: Positive for leg swelling. Negative for chest pain, palpitations, orthopnea, claudication and PND.  Gastrointestinal: Negative for abdominal pain and vomiting.  Genitourinary: Negative for dysuria and hematuria.  Musculoskeletal: Negative for arthralgias and back pain.  Skin: Negative for color change and rash.  Neurological: Negative for seizures and syncope.  All other systems reviewed and are negative.    Physical Exam Updated Vital Signs BP (!) 162/59 (BP Location: Left Arm)   Pulse 86   Temp 99.8 F (37.7 C) (Oral)   Resp 20   Ht 5\' 8"  (1.727 m)   Wt 132.5 kg (292 lb)   SpO2 100%   BMI 44.40 kg/m   Physical Exam  Constitutional: He appears well-developed and well-nourished.  HENT:  Head: Normocephalic and atraumatic.  Eyes: Conjunctivae are normal.  Neck: Neck supple.    Cardiovascular: Normal rate and regular rhythm.  Pulmonary/Chest: Effort normal and breath sounds normal. No accessory muscle usage.  Abdominal: Soft. He exhibits no mass. There is no tenderness.  Musculoskeletal: Normal range of motion.       Right lower leg: He exhibits edema (trace). He exhibits no tenderness.       Left lower leg: He exhibits edema (1+). He exhibits no tenderness.  Neurological: He is alert. GCS eye subscore is 4. GCS verbal subscore is 5. GCS motor subscore is 6.  Skin: Skin is warm and dry. Capillary refill takes less than 2 seconds.  Psychiatric: He has a normal mood and affect.  Nursing note and vitals reviewed.    ED Treatments / Results  Labs (all labs ordered are listed, but only abnormal results are displayed) Labs Reviewed  BASIC METABOLIC PANEL - Abnormal; Notable for the following components:      Result Value   Glucose, Bld 196 (*)    Creatinine, Ser 1.26 (*)    Calcium 8.5 (*)    GFR calc non Af Amer 54 (*)    All other components within normal limits  CBC WITH DIFFERENTIAL/PLATELET - Abnormal; Notable for the following components:   RBC 3.02 (*)    Hemoglobin 9.6 (*)    HCT 30.1 (*)    All other components within normal limits  TROPONIN I  BRAIN NATRIURETIC PEPTIDE    EKG EKG Interpretation  Date/Time:  Sunday February 19 2018 08:31:08 EDT Ventricular Rate:  83 PR Interval:    QRS Duration: 98 QT Interval:  357 QTC Calculation: 420 R Axis:   -61 Text Interpretation:  Sinus rhythm Left anterior fascicular block Low voltage, precordial leads Abnormal R-wave progression, late transition Borderline T wave abnormalities similar to prior 12/16 Confirmed by Aletta Edouard [phone]) on 02/19/2018 8:48:41 AM   Radiology Ct Angio Chest Pe W And/or Wo Contrast  Result Date: 02/20/2018 CLINICAL DATA:  Shortness of breath for 1 month. EXAM: CT ANGIOGRAPHY CHEST WITH CONTRAST TECHNIQUE: Multidetector CT imaging of the chest was performed using the  standard protocol  during bolus administration of intravenous contrast. Multiplanar CT image reconstructions and MIPs were obtained to evaluate the vascular anatomy. CONTRAST:  110mL ISOVUE-370 IOPAMIDOL (ISOVUE-370) INJECTION 76% COMPARISON:  Abdominal CT 02/24/2017 FINDINGS: Cardiovascular: Satisfactory opacification of the pulmonary arteries to the segmental level. No evidence of pulmonary embolism. Normal heart size. No pericardial effusion. Extensive atherosclerotic calcification of the coronaries, status post CABG. No acute aortic finding. Mediastinum/Nodes: Symmetric gynecomastia. Right hemithyroidectomy. Subcentimeter nodule in the lower pole left thyroid. Right hilar adenopathy that is asymmetric. Sub-carinal adenopathy measuring 22 mm short axis. These were not seen on a 02/25/2007 abdominal CT which covered much of the lower mediastinum. Lungs/Pleura: There is no edema, consolidation, effusion, or pneumothorax. Mild fissure thickening and occasional interlobular septal thickening which is likely atelectatic. No definite edema. Upper Abdomen: Cholecystectomy. Left upper pole renal cystic density. Musculoskeletal: No acute or aggressive finding. Review of the MIP images confirms the above findings. IMPRESSION: 1. Negative for pulmonary embolism or other acute finding. 2. Right hilar and subcarinal adenopathy not seen on a 2008 abdominal CT. No explainable findings on today's scan. If there is no explainable chronic lung disease or CHF on clinical grounds a follow-up CT could be obtained in 3-6 months. 3. Aortic Atherosclerosis (ICD10-I70.0). Electronically Signed   By: Monte Fantasia M.D.   On: 02/20/2018 09:26    Procedures Procedures (including critical care time)  Medications Ordered in ED Medications  furosemide (LASIX) tablet 20 mg (20 mg Oral Given 02/19/18 1110)  acetaminophen (TYLENOL) tablet 650 mg (650 mg Oral Given 02/19/18 1110)     Initial Impression / Assessment and Plan / ED Course   I have reviewed the triage vital signs and the nursing notes.  Pertinent labs & imaging results that were available during my care of the patient were reviewed by me and considered in my medical decision making (see chart for details).  Clinical Course as of Feb 21 1013  Sun Feb 19, 2018  2229 Cardiac echo 9/18 - Study Conclusions  - Left ventricle: The cavity size was normal. Wall thickness was   increased in a pattern of mild LVH. Systolic function was normal.   The estimated ejection fraction was in the range of 60% to 65%.   Features are consistent with a pseudonormal left ventricular   filling pattern, with concomitant abnormal relaxation and   increased filling pressure (grade 2 diastolic dysfunction).   Doppler parameters are consistent with high ventricular filling   pressure. - Aortic valve: Mildly calcified annulus. Trileaflet; mildly   thickened leaflets. Valve area (VTI): 2.56 cm^2. Valve area   (Vmax): 2.11 cm^2. - Mitral valve: Mildly calcified annulus. Mildly thickened leaflets   . - Technically difficult study. Echocontrast was used to enhance   visualization.   [MB]  [id number] Patient here with acute on chronic shortness of breath/dyspnea on exertion.  Differential includes micro-ischemia, CHF, pneumonia, PE, anemia.  Checking labs chest x-ray EKG cardiac markers.   [MB]  7989 His hematocrit is slightly lower than his baseline by me point on the hemoglobin.  We will trial low-dose diuretic with him and trend him and see how he does   [MB]  1259 Reevaluated patient.  Nurse that he did well with trending pulse ox sats are 98%.  Patient states he was still had dyspnea on exertion when he was walking and does not feel very much improved.  On review his PCP notes it sounds like this is some chronic dyspnea but with worsening over time.  Currently  I do not think he requires admission but will need close follow-up with his PCP to follow his anemia and possible referral onto  cardiology and pulmonology.  Patient understands to return if he has any worsening symptoms.   [MB]    Clinical Course User Index [MB] Hayden Rasmussen, MD     Final Clinical Impressions(s) / ED Diagnoses   Final diagnoses:  Dyspnea on exertion    ED Discharge Orders        Ordered    furosemide (LASIX) 20 MG tablet  Daily     02/19/18 1302       Hayden Rasmussen, MD 02/21/18 1016

## 2018-02-20 ENCOUNTER — Emergency Department (HOSPITAL_COMMUNITY): Payer: Medicare HMO

## 2018-02-20 ENCOUNTER — Observation Stay (HOSPITAL_COMMUNITY)
Admission: EM | Admit: 2018-02-20 | Discharge: 2018-02-21 | Disposition: A | Discharge status: Home / Self Care | Payer: Medicare HMO | Attending: Internal Medicine | Admitting: Internal Medicine

## 2018-02-20 ENCOUNTER — Observation Stay (HOSPITAL_BASED_OUTPATIENT_CLINIC_OR_DEPARTMENT_OTHER): Payer: Medicare HMO

## 2018-02-20 ENCOUNTER — Other Ambulatory Visit: Payer: Self-pay

## 2018-02-20 ENCOUNTER — Encounter (HOSPITAL_COMMUNITY): Payer: Self-pay | Admitting: Emergency Medicine

## 2018-02-20 DIAGNOSIS — E66813 Obesity, class 3: Secondary | ICD-10-CM

## 2018-02-20 DIAGNOSIS — E1122 Type 2 diabetes mellitus with diabetic chronic kidney disease: Secondary | ICD-10-CM | POA: Diagnosis not present

## 2018-02-20 DIAGNOSIS — D649 Anemia, unspecified: Secondary | ICD-10-CM | POA: Diagnosis not present

## 2018-02-20 DIAGNOSIS — I129 Hypertensive chronic kidney disease with stage 1 through stage 4 chronic kidney disease, or unspecified chronic kidney disease: Secondary | ICD-10-CM | POA: Diagnosis not present

## 2018-02-20 DIAGNOSIS — Z6841 Body Mass Index (BMI) 40.0 and over, adult: Secondary | ICD-10-CM | POA: Diagnosis not present

## 2018-02-20 DIAGNOSIS — R6511 Systemic inflammatory response syndrome (SIRS) of non-infectious origin with acute organ dysfunction: Secondary | ICD-10-CM | POA: Diagnosis not present

## 2018-02-20 DIAGNOSIS — N181 Chronic kidney disease, stage 1: Secondary | ICD-10-CM | POA: Diagnosis not present

## 2018-02-20 DIAGNOSIS — I10 Essential (primary) hypertension: Secondary | ICD-10-CM | POA: Diagnosis not present

## 2018-02-20 DIAGNOSIS — J189 Pneumonia, unspecified organism: Secondary | ICD-10-CM | POA: Diagnosis not present

## 2018-02-20 DIAGNOSIS — N400 Enlarged prostate without lower urinary tract symptoms: Secondary | ICD-10-CM | POA: Diagnosis not present

## 2018-02-20 DIAGNOSIS — I2510 Atherosclerotic heart disease of native coronary artery without angina pectoris: Secondary | ICD-10-CM | POA: Diagnosis present

## 2018-02-20 DIAGNOSIS — R0600 Dyspnea, unspecified: Secondary | ICD-10-CM

## 2018-02-20 DIAGNOSIS — I5032 Chronic diastolic (congestive) heart failure: Secondary | ICD-10-CM | POA: Diagnosis not present

## 2018-02-20 DIAGNOSIS — Z87891 Personal history of nicotine dependence: Secondary | ICD-10-CM | POA: Insufficient documentation

## 2018-02-20 DIAGNOSIS — N183 Chronic kidney disease, stage 3 (moderate): Secondary | ICD-10-CM | POA: Diagnosis not present

## 2018-02-20 DIAGNOSIS — I130 Hypertensive heart and chronic kidney disease with heart failure and stage 1 through stage 4 chronic kidney disease, or unspecified chronic kidney disease: Secondary | ICD-10-CM | POA: Diagnosis not present

## 2018-02-20 DIAGNOSIS — A419 Sepsis, unspecified organism: Secondary | ICD-10-CM | POA: Diagnosis not present

## 2018-02-20 DIAGNOSIS — E785 Hyperlipidemia, unspecified: Secondary | ICD-10-CM | POA: Diagnosis not present

## 2018-02-20 DIAGNOSIS — G4733 Obstructive sleep apnea (adult) (pediatric): Secondary | ICD-10-CM | POA: Diagnosis present

## 2018-02-20 DIAGNOSIS — Z6839 Body mass index (BMI) 39.0-39.9, adult: Secondary | ICD-10-CM

## 2018-02-20 DIAGNOSIS — Z79899 Other long term (current) drug therapy: Secondary | ICD-10-CM | POA: Diagnosis not present

## 2018-02-20 DIAGNOSIS — R6510 Systemic inflammatory response syndrome (SIRS) of non-infectious origin without acute organ dysfunction: Secondary | ICD-10-CM

## 2018-02-20 DIAGNOSIS — R0602 Shortness of breath: Secondary | ICD-10-CM | POA: Diagnosis not present

## 2018-02-20 DIAGNOSIS — E782 Mixed hyperlipidemia: Secondary | ICD-10-CM | POA: Diagnosis not present

## 2018-02-20 DIAGNOSIS — Z951 Presence of aortocoronary bypass graft: Secondary | ICD-10-CM | POA: Diagnosis not present

## 2018-02-20 DIAGNOSIS — Z794 Long term (current) use of insulin: Secondary | ICD-10-CM | POA: Insufficient documentation

## 2018-02-20 DIAGNOSIS — E1129 Type 2 diabetes mellitus with other diabetic kidney complication: Secondary | ICD-10-CM | POA: Diagnosis present

## 2018-02-20 HISTORY — DX: Dyspnea, unspecified: R06.00

## 2018-02-20 LAB — CBC WITH DIFFERENTIAL/PLATELET
Basophils Absolute: 0 10*3/uL (ref 0.0–0.1)
Basophils Relative: 0 %
Eosinophils Absolute: 0.1 10*3/uL (ref 0.0–0.7)
Eosinophils Relative: 1 %
HCT: 32.2 % — ABNORMAL LOW (ref 39.0–52.0)
Hemoglobin: 10.3 g/dL — ABNORMAL LOW (ref 13.0–17.0)
Lymphocytes Relative: 17 %
Lymphs Abs: 0.9 10*3/uL (ref 0.7–4.0)
MCH: 31.6 pg (ref 26.0–34.0)
MCHC: 32 g/dL (ref 30.0–36.0)
MCV: 98.8 fL (ref 78.0–100.0)
Monocytes Absolute: 0.4 10*3/uL (ref 0.1–1.0)
Monocytes Relative: 7 %
Neutro Abs: 3.6 10*3/uL (ref 1.7–7.7)
Neutrophils Relative %: 75 %
Platelets: 314 10*3/uL (ref 150–400)
RBC: 3.26 MIL/uL — ABNORMAL LOW (ref 4.22–5.81)
RDW: 15 % (ref 11.5–15.5)
WBC: 4.9 10*3/uL (ref 4.0–10.5)

## 2018-02-20 LAB — ECHOCARDIOGRAM COMPLETE
Height: 68 in
Weight: 4486.8 oz

## 2018-02-20 LAB — URINALYSIS, ROUTINE W REFLEX MICROSCOPIC
Bacteria, UA: NONE SEEN
Bilirubin Urine: NEGATIVE
Glucose, UA: NEGATIVE mg/dL
Ketones, ur: 5 mg/dL — AB
Leukocytes, UA: NEGATIVE
Nitrite: NEGATIVE
Protein, ur: 100 mg/dL — AB
Specific Gravity, Urine: 1.021 (ref 1.005–1.030)
Squamous Epithelial / LPF: NONE SEEN
pH: 5 (ref 5.0–8.0)

## 2018-02-20 LAB — COMPREHENSIVE METABOLIC PANEL
ALT: 19 U/L (ref 17–63)
AST: 20 U/L (ref 15–41)
Albumin: 3.1 g/dL — ABNORMAL LOW (ref 3.5–5.0)
Alkaline Phosphatase: 51 U/L (ref 38–126)
Anion gap: 12 (ref 5–15)
BUN: 15 mg/dL (ref 6–20)
CO2: 22 mmol/L (ref 22–32)
Calcium: 8.4 mg/dL — ABNORMAL LOW (ref 8.9–10.3)
Chloride: 101 mmol/L (ref 101–111)
Creatinine, Ser: 1.25 mg/dL — ABNORMAL HIGH (ref 0.61–1.24)
GFR calc Af Amer: >60 mL/min (ref >60)
GFR calc non Af Amer: 55 mL/min — ABNORMAL LOW (ref >60)
Glucose, Bld: 281 mg/dL — ABNORMAL HIGH (ref 65–99)
Potassium: 3.9 mmol/L (ref 3.5–5.1)
Sodium: 135 mmol/L (ref 135–145)
Total Bilirubin: 1.1 mg/dL (ref 0.3–1.2)
Total Protein: 7.1 g/dL (ref 6.5–8.1)

## 2018-02-20 LAB — GLUCOSE, CAPILLARY
Glucose-Capillary: 243 mg/dL — ABNORMAL HIGH (ref 65–99)
Glucose-Capillary: 128 mg/dL — ABNORMAL HIGH (ref 65–99)

## 2018-02-20 LAB — TROPONIN I
Troponin I: <0.03 ng/mL (ref <0.03)
Troponin I: <0.03 ng/mL (ref <0.03)
Troponin I: <0.03 ng/mL (ref <0.03)

## 2018-02-20 LAB — HEMOGLOBIN A1C
Hgb A1c MFr Bld: 7 % — ABNORMAL HIGH (ref 4.8–5.6)
Mean Plasma Glucose: 154.2 mg/dL

## 2018-02-20 LAB — I-STAT CG4 LACTIC ACID, ED: Lactic Acid, Venous: 1.91 mmol/L — ABNORMAL HIGH (ref 0.5–1.9)

## 2018-02-20 MED ORDER — INSULIN ASPART PROT & ASPART (70-30 MIX) 100 UNIT/ML ~~LOC~~ SUSP: 40.0000 [IU] | Freq: Two times a day (BID) | SUBCUTANEOUS | Status: DC

## 2018-02-20 MED ORDER — IPRATROPIUM-ALBUTEROL 0.5-2.5 (3) MG/3ML IN SOLN
3.0000 mL | RESPIRATORY_TRACT | Status: DC
Start: 2018-02-20 — End: 2018-02-20
  Administered 2018-02-20: 3 mL via RESPIRATORY_TRACT
  Filled 2018-02-20: qty 3

## 2018-02-20 MED ORDER — TIMOLOL MALEATE 0.5 % OP SOLN
1.0000 [drp] | Freq: Two times a day (BID) | OPHTHALMIC | Status: DC
  Administered 2018-02-20 – 2018-02-21 (×3): 1 [drp] via OPHTHALMIC
  Filled 2018-02-20: qty 5

## 2018-02-20 MED ORDER — LOSARTAN POTASSIUM 50 MG PO TABS
100.0000 mg | ORAL_TABLET | Freq: Every day | ORAL | Status: DC
  Administered 2018-02-20 – 2018-02-21 (×2): 100 mg via ORAL
  Filled 2018-02-20 (×2): qty 2

## 2018-02-20 MED ORDER — PERFLUTREN LIPID MICROSPHERE
1.0000 mL | INTRAVENOUS | Status: AC | PRN
  Administered 2018-02-20: 2 mL via INTRAVENOUS
  Filled 2018-02-20: qty 10

## 2018-02-20 MED ORDER — TAMSULOSIN HCL 0.4 MG PO CAPS
0.8000 mg | ORAL_CAPSULE | Freq: Every evening | ORAL | Status: DC
  Administered 2018-02-20 – 2018-02-21 (×2): 0.8 mg via ORAL
  Filled 2018-02-20 (×2): qty 2

## 2018-02-20 MED ORDER — ONDANSETRON HCL 4 MG PO TABS: 4.0000 mg | ORAL_TABLET | Freq: Four times a day (QID) | ORAL | Status: DC | PRN

## 2018-02-20 MED ORDER — SODIUM CHLORIDE 0.9% FLUSH
3.0000 mL | Freq: Two times a day (BID) | INTRAVENOUS | Status: DC
  Administered 2018-02-20 – 2018-02-21 (×3): 3 mL via INTRAVENOUS

## 2018-02-20 MED ORDER — INSULIN ASPART 100 UNIT/ML ~~LOC~~ SOLN: 0.0000 [IU] | Freq: Every day | SUBCUTANEOUS | Status: DC

## 2018-02-20 MED ORDER — ONDANSETRON HCL 4 MG/2ML IJ SOLN: 4.0000 mg | Freq: Four times a day (QID) | INTRAMUSCULAR | Status: DC | PRN

## 2018-02-20 MED ORDER — ACETAMINOPHEN 500 MG PO TABS
1000.0000 mg | ORAL_TABLET | Freq: Once | ORAL | Status: AC
  Administered 2018-02-20: 1000 mg via ORAL
  Filled 2018-02-20: qty 2

## 2018-02-20 MED ORDER — SODIUM CHLORIDE 0.9% FLUSH: 3.0000 mL | INTRAVENOUS | Status: DC | PRN

## 2018-02-20 MED ORDER — INSULIN ASPART PROT & ASPART (70-30 MIX) 100 UNIT/ML ~~LOC~~ SUSP
40.0000 [IU] | Freq: Every day | SUBCUTANEOUS | Status: DC
  Administered 2018-02-21: 40 [IU] via SUBCUTANEOUS
  Filled 2018-02-20: qty 10

## 2018-02-20 MED ORDER — CLOTRIMAZOLE-BETAMETHASONE 1-0.05 % EX CREA
1.0000 "application " | TOPICAL_CREAM | Freq: Two times a day (BID) | CUTANEOUS | Status: DC
  Administered 2018-02-20 – 2018-02-21 (×2): 1 via TOPICAL
  Filled 2018-02-20: qty 15

## 2018-02-20 MED ORDER — VANCOMYCIN HCL IN DEXTROSE 1-5 GM/200ML-% IV SOLN: 1000.0000 mg | Freq: Once | INTRAVENOUS | Status: DC

## 2018-02-20 MED ORDER — INSULIN ASPART 100 UNIT/ML ~~LOC~~ SOLN
0.0000 [IU] | Freq: Three times a day (TID) | SUBCUTANEOUS | Status: DC
  Administered 2018-02-21: 2 [IU] via SUBCUTANEOUS
  Administered 2018-02-21 (×2): 3 [IU] via SUBCUTANEOUS

## 2018-02-20 MED ORDER — ACETAMINOPHEN 325 MG PO TABS
650.0000 mg | ORAL_TABLET | Freq: Four times a day (QID) | ORAL | Status: DC | PRN
  Administered 2018-02-21 (×2): 650 mg via ORAL
  Filled 2018-02-20 (×2): qty 2

## 2018-02-20 MED ORDER — PIOGLITAZONE HCL 15 MG PO TABS
30.0000 mg | ORAL_TABLET | Freq: Every day | ORAL | Status: DC
  Administered 2018-02-20 – 2018-02-21 (×2): 30 mg via ORAL
  Filled 2018-02-20 (×4): qty 2

## 2018-02-20 MED ORDER — KETOROLAC TROMETHAMINE 0.5 % OP SOLN
1.0000 [drp] | Freq: Four times a day (QID) | OPHTHALMIC | Status: DC
  Administered 2018-02-20 – 2018-02-21 (×4): 1 [drp] via OPHTHALMIC
  Filled 2018-02-20: qty 3

## 2018-02-20 MED ORDER — ASPIRIN-DIPYRIDAMOLE ER 25-200 MG PO CP12
1.0000 | ORAL_CAPSULE | Freq: Two times a day (BID) | ORAL | Status: DC
  Administered 2018-02-20 – 2018-02-21 (×2): 1 via ORAL
  Filled 2018-02-20 (×5): qty 1

## 2018-02-20 MED ORDER — ACETAMINOPHEN 650 MG RE SUPP: 650.0000 mg | Freq: Four times a day (QID) | RECTAL | Status: DC | PRN

## 2018-02-20 MED ORDER — PIPERACILLIN-TAZOBACTAM 3.375 G IVPB 30 MIN
3.3750 g | Freq: Once | INTRAVENOUS | Status: AC
  Administered 2018-02-20: 3.375 g via INTRAVENOUS
  Filled 2018-02-20: qty 50

## 2018-02-20 MED ORDER — FOLIC ACID 1 MG PO TABS
1.0000 mg | ORAL_TABLET | Freq: Every day | ORAL | Status: DC
  Administered 2018-02-20 – 2018-02-21 (×2): 1 mg via ORAL
  Filled 2018-02-20 (×2): qty 1

## 2018-02-20 MED ORDER — IPRATROPIUM-ALBUTEROL 0.5-2.5 (3) MG/3ML IN SOLN
3.0000 mL | Freq: Three times a day (TID) | RESPIRATORY_TRACT | Status: DC
  Administered 2018-02-21 (×2): 3 mL via RESPIRATORY_TRACT
  Filled 2018-02-20 (×2): qty 3

## 2018-02-20 MED ORDER — TIMOLOL HEMIHYDRATE 0.5 % OP SOLN: 1.0000 [drp] | Freq: Two times a day (BID) | OPHTHALMIC | Status: DC

## 2018-02-20 MED ORDER — ATORVASTATIN CALCIUM 40 MG PO TABS
40.0000 mg | ORAL_TABLET | Freq: Every evening | ORAL | Status: DC
  Administered 2018-02-20 – 2018-02-21 (×2): 40 mg via ORAL
  Filled 2018-02-20 (×2): qty 1

## 2018-02-20 MED ORDER — FUROSEMIDE 20 MG PO TABS
20.0000 mg | ORAL_TABLET | Freq: Every day | ORAL | Status: DC
  Administered 2018-02-20 – 2018-02-21 (×2): 20 mg via ORAL
  Filled 2018-02-20 (×2): qty 1

## 2018-02-20 MED ORDER — SODIUM CHLORIDE 0.9 % IV SOLN: 250.0000 mL | INTRAVENOUS | Status: DC | PRN

## 2018-02-20 MED ORDER — ALBUTEROL SULFATE (2.5 MG/3ML) 0.083% IN NEBU
2.5000 mg | INHALATION_SOLUTION | Freq: Once | RESPIRATORY_TRACT | Status: AC
  Administered 2018-02-20: 2.5 mg via RESPIRATORY_TRACT
  Filled 2018-02-20: qty 3

## 2018-02-20 MED ORDER — GUAIFENESIN ER 600 MG PO TB12
1200.0000 mg | ORAL_TABLET | Freq: Two times a day (BID) | ORAL | Status: DC
  Administered 2018-02-20 – 2018-02-21 (×2): 1200 mg via ORAL
  Filled 2018-02-20 (×3): qty 2

## 2018-02-20 MED ORDER — INSULIN ASPART PROT & ASPART (70-30 MIX) 100 UNIT/ML ~~LOC~~ SUSP
50.0000 [IU] | Freq: Every day | SUBCUTANEOUS | Status: DC
  Administered 2018-02-20 – 2018-02-21 (×2): 50 [IU] via SUBCUTANEOUS
  Filled 2018-02-20 (×2): qty 10

## 2018-02-20 MED ORDER — ENOXAPARIN SODIUM 40 MG/0.4ML ~~LOC~~ SOLN
40.0000 mg | SUBCUTANEOUS | Status: DC
  Administered 2018-02-20: 40 mg via SUBCUTANEOUS
  Filled 2018-02-20: qty 0.4

## 2018-02-20 MED ORDER — HYPROMELLOSE (GONIOSCOPIC) 2.5 % OP SOLN
1.0000 [drp] | Freq: Four times a day (QID) | OPHTHALMIC | Status: DC | PRN
  Filled 2018-02-20: qty 15

## 2018-02-20 MED ORDER — POTASSIUM CHLORIDE CRYS ER 20 MEQ PO TBCR
20.0000 meq | EXTENDED_RELEASE_TABLET | Freq: Every day | ORAL | Status: DC
  Administered 2018-02-20 – 2018-02-21 (×2): 20 meq via ORAL
  Filled 2018-02-20 (×2): qty 1

## 2018-02-20 MED ORDER — VANCOMYCIN HCL 10 G IV SOLR
2000.0000 mg | Freq: Once | INTRAVENOUS | Status: AC
  Administered 2018-02-20: 2000 mg via INTRAVENOUS
  Filled 2018-02-20: qty 2000

## 2018-02-20 MED ORDER — IOPAMIDOL (ISOVUE-370) INJECTION 76%
100.0000 mL | Freq: Once | INTRAVENOUS | Status: AC | PRN
  Administered 2018-02-20: 100 mL via INTRAVENOUS

## 2018-02-20 MED ORDER — ALBUTEROL SULFATE (2.5 MG/3ML) 0.083% IN NEBU: 2.5000 mg | INHALATION_SOLUTION | RESPIRATORY_TRACT | Status: DC | PRN

## 2018-02-20 MED ORDER — METOPROLOL TARTRATE 25 MG PO TABS
25.0000 mg | ORAL_TABLET | Freq: Two times a day (BID) | ORAL | Status: DC
  Administered 2018-02-20 – 2018-02-21 (×3): 25 mg via ORAL
  Filled 2018-02-20 (×3): qty 1

## 2018-02-20 MED ORDER — VANCOMYCIN HCL 10 G IV SOLR
1250.0000 mg | Freq: Two times a day (BID) | INTRAVENOUS | Status: DC
  Filled 2018-02-20 (×2): qty 1250

## 2018-02-20 MED ORDER — AMLODIPINE BESYLATE 5 MG PO TABS
10.0000 mg | ORAL_TABLET | Freq: Every day | ORAL | Status: DC
  Administered 2018-02-20 – 2018-02-21 (×2): 10 mg via ORAL
  Filled 2018-02-20 (×2): qty 2

## 2018-02-20 MED ORDER — ATROPINE SULFATE 1 % OP SOLN
1.0000 [drp] | Freq: Every day | OPHTHALMIC | Status: DC
  Filled 2018-02-20: qty 2

## 2018-02-20 MED ORDER — PIPERACILLIN-TAZOBACTAM 3.375 G IVPB: 3.3750 g | Freq: Three times a day (TID) | INTRAVENOUS | Status: DC

## 2018-02-20 NOTE — ED Provider Notes (Addendum)
[facility] EMERGENCY DEPARTMENT Provider Note   CSN: [id number] Arrival date & time: 02/20/18  3329     History   Chief Complaint Chief Complaint  Patient presents with  . Shortness of Breath    HPI [name] is a 77 y.o. male.  Complains of shortness of breath onset 3 weeks ago symptoms accompanied by slight cough.  Denies pain anywhere denies fever denies vomiting seen here yesterday treated with Lasix, shortness of breath continues to worsen.  He states presently is "bad" sleeps on one pillow.  Nothing makes symptoms better or worse.  No other associated symptoms  HPI  Past Medical History:  Diagnosis Date  . C. difficile diarrhea   . 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 Coronary artery disease    Status post CABG in 2007 [facility] [place])  . 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 GERD (gastroesophageal reflux disease)   . Glaucoma   . Hypertension   . Peripheral vascular disease ([place])   . Sleep apnea   . Stroke [facility]) 2008, 2014, 2015    Patient Active Problem List   Diagnosis Date Noted  . DDD (degenerative disc disease), lumbar 10/17/2017  . Chronic diastolic heart failure ([place]) 10/20/2015  . Peripheral edema 01/22/2015  . Tinea pedis 10/21/2014  . Prolonged Q-T interval on ECG 04/02/2014  . 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 Chronic kidney disease 11/21/2013  . Left leg weakness 09/06/2013  . CVA (cerebral infarction) 09/01/2013  . Left-sided weakness 08/22/2013  . OSA (obstructive sleep apnea) 04/12/2013  . DM (diabetes mellitus) type II controlled with renal manifestation ([place]) 11/28/2012  . PVD (peripheral vascular disease) ([place]) 11/28/2012  . CAD (coronary artery disease) 11/28/2012  . Hyperlipidemia 11/28/2012  . Obesity 11/28/2012  . History of stroke 11/28/2012  . OAB (overactive bladder) 11/28/2012  . HTN (hypertension)  05/20/2011    Past Surgical History:  Procedure Laterality Date  . CHOLECYSTECTOMY    . CORONARY ARTERY BYPASS GRAFT    . NO PAST SURGERIES    . THYROID SURGERY          Home Medications    Prior to Admission medications   Medication Sig Start Date End Date Taking? Authorizing Provider  acetaminophen (TYLENOL) 650 MG CR tablet Take 1,300 mg by mouth every 8 (eight) hours as needed for pain.    [provider]  Adalimumab 80 MG/0.8ML & 40MG /0.4ML PNKT Inject 1 Dose into the skin every 14 (fourteen) days. 09/21/17   [provider]  amLODipine (NORVASC) 10 MG tablet Take 10 mg by mouth daily.    [provider]  atorvastatin (LIPITOR) 80 MG tablet Take 40 mg by mouth every evening.     [provider]  atropine 1 % ophthalmic solution Place 1 drop into the left eye daily.  03/28/17   [provider]  Brinzolamide-Brimonidine (SIMBRINZA) 1-0.2 % SUSP Apply 1 drop to eye 2 (two) times daily.    [provider]  carboxymethylcellulose (REFRESH PLUS) 0.5 % SOLN Place 1 drop into both eyes 4 (four) times daily as needed.    [provider]  clotrimazole-betamethasone (LOTRISONE) cream Apply 1 application topically 2 (two) times daily. 12/02/17   [place], Modena Nunnery, MD  dipyridamole-aspirin [facility]) 200-25 MG per 12 hr capsule Take 1 capsule by mouth 2 (two) times daily.    [provider]  folic acid (FOLVITE) 1 MG tablet Take 1 mg by mouth. 03/14/17   [provider]  furosemide (LASIX) 20 MG tablet Take 1 tablet (20 mg total) by mouth daily. 02/19/18   Hayden Rasmussen, MD  insulin aspart protamine- aspart (NOVOLOG MIX 70/30) (70-30) 100 UNIT/ML injection Inject into the skin. Patient takes 40 units in the morning and 50 units at night.    [provider]  Ipratropium-Albuterol (COMBIVENT RESPIMAT) 20-100 MCG/ACT AERS respimat Inhale 1 puff into the lungs every 6 (six) hours.    [provider]    ketorolac (ACULAR) 0.5 % ophthalmic solution Place 1 drop into the right eye 4 (four) times daily.  03/10/17   [provider]  losartan (COZAAR) 100 MG tablet Take 100 mg by mouth daily.      [provider]  metFORMIN (GLUCOPHAGE) 1000 MG tablet Take 1,000 mg by mouth 2 (two) times daily with a meal.      [provider]  methotrexate (RHEUMATREX) 2.5 MG tablet Take 20 mg by mouth once a week. 06/23/17   [provider]  metoprolol (LOPRESSOR) 50 MG tablet Take 0.5 tablets (25 mg total) by mouth 2 (two) times daily. FOR HEART/BLOOD PRESSURE. HOLD FOR SYSTOLIC BLOOD PRESSURE LESS THAN 1OO/HEART RATE LESS THAN 60 05/20/11   Annita Brod, MD  pioglitazone (ACTOS) 30 MG tablet Take 30 mg by mouth daily.     [provider]  potassium chloride (K-DUR,KLOR-CON) 10 MEQ tablet Take 2 tablets (20 mEq total) by mouth daily. Take with lasix 10/17/17   Alycia Rossetti, MD  spironolactone (ALDACTONE) 25 MG tablet Take 25 mg by mouth daily.    [provider]  tamsulosin (FLOMAX) 0.4 MG CAPS capsule Take 2 capsules (0.8 mg total) by mouth every evening. 06/09/16   [place], Modena Nunnery, MD  timolol (BETIMOL) 0.5 % ophthalmic solution Place 1 drop into the left eye 2 (two) times daily.    [provider]    Family History Family History  Problem Relation Age of Onset  . Diabetes Mother   . Heart failure Mother   . Hypertension Mother   . Diabetes Father   . Heart failure Father   . Hypertension Father   . Diabetes Sister   . Heart failure Sister   . Hypertension Sister   . Hyperlipidemia Sister   . Diabetes Brother   . Heart failure Brother   . Hypertension Brother     Social History Social History   Tobacco Use  . Smoking status: Former Research scientist (life sciences)  . Smokeless tobacco: Never Used  Substance Use Topics  . Alcohol use: No  . Drug use: No     Allergies   Patient has no known allergies.   Review of Systems Review of Systems   Constitutional: Negative.   HENT: Negative.   Respiratory: Positive for cough and shortness of breath.   Cardiovascular: Positive for leg swelling.       Chronic left leg swelling since CABG several years ago  Gastrointestinal: Negative.   Musculoskeletal: Negative.   Skin: Negative.   Allergic/Immunologic: Positive for immunocompromised state.       Diabetic  Neurological: Negative.   Psychiatric/Behavioral: Negative.   All other systems reviewed and are negative.    Physical Exam Updated Vital Signs BP (!) 151/68 (BP Location: Right Arm)   Pulse (!) 115   Temp 100.1 F (37.8 C) (Oral)   Resp (!) 26   Ht 5\' 8"  (1.727  m)   Wt 132.5 kg (292 lb)   SpO2 97%   BMI 44.40 kg/m   Physical Exam  Constitutional: He is oriented to person, place, and time. He appears well-developed and well-nourished. No distress.  HENT:  Head: Normocephalic and atraumatic.  Eyes: Pupils are equal, round, and reactive to light. Conjunctivae are normal.  Neck: Neck supple. No tracheal deviation present. No thyromegaly present.  Cardiovascular: Regular rhythm.  No murmur heard. Tachycardic  Pulmonary/Chest: Effort normal and breath sounds normal.  Abdominal: Soft. Bowel sounds are normal. He exhibits no distension. There is no tenderness.  Obese  Musculoskeletal: Normal range of motion. He exhibits no edema or tenderness.  Neurological: He is alert and oriented to person, place, and time. Coordination normal.  Skin: Skin is warm and dry. Capillary refill takes less than 2 seconds. No rash noted.  Psychiatric: He has a normal mood and affect.  Nursing note and vitals reviewed.    ED Treatments / Results  Labs (all labs ordered are listed, but only abnormal results are displayed) Labs Reviewed - No data to display  EKG None  EKG Interpretation  Date/Time:  Monday February 20 2018 07:31:58 EDT Ventricular Rate:  112 PR Interval:    QRS Duration: 79 QT Interval:  304 QTC  Calculation: 415 R Axis:   -85 Text Interpretation:  Sinus or ectopic atrial tachycardia Ventricular premature complex Inferior infarct, old Anterior infarct, old SINCE LAST TRACING HEART RATE HAS INCREASED Confirmed by Orlie Dakin [phone]) on 02/20/2018 7:45:05 AM     Radiology Dg Chest 2 View  Result Date: 02/19/2018 CLINICAL DATA:  SOB for several days, legs swelling EXAM: CHEST - 2 VIEW COMPARISON:  08/05/2017 FINDINGS: Status post median sternotomy. Heart is UPPER normal in size. There is mild perihilar vascular prominence. No overt edema. No focal consolidations or pleural effusions. There is wedge deformity of numerous LOWER thoracic and UPPER lumbar vertebral bodies, stable in appearance. IMPRESSION: 1. Cardiomegaly and mild edema. 2. 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 3.  Recommend DXA bone density exam if not previously performed. Electronically Signed   By: Nolon Nations M.D.   On: 02/19/2018 10:05    Procedures Procedures (including critical care time)  Medications Ordered in ED Medications - No data to display   Initial Impression / Assessment and Plan / ED Course  I have reviewed the triage vital signs and the nursing notes.  Pertinent labs & imaging results that were available during my care of the patient were reviewed by me and considered in my medical decision making (see chart for details).     Code sepsis called based on sirs criteria fever, tachycardia first of infection unclear 9:45 AM patient states his breathing feels slightly worse within the past few minutes, since treatment with intravenous antibiotics, and Tylenol.  On exam he is in no respiratory distress.  Speaks in paragraphs.  Lungs clear to auscultation Sepsis - Repeat Assessment  Performed at:    945am  Vitals     Blood pressure (!) 145/73, pulse (!) 107, temperature (!) 100.9 F (38.3 C), temperature source Rectal, resp. rate (!) 21, height 5\' 8"  (1.727 m),  weight 132.5 kg (292 lb), SpO2 97 %.  Heart:     Tachycardic  Lungs:    CTA  Capillary Refill:   <2 sec  Peripheral Pulse:   Radial pulse palpable  Skin:     Normal Color    Albuterol nebulized treatment ordered.  10:20 AM states breathing is improved after treatment with albuterol nebulized Results for orders placed or performed during the hospital encounter of 02/20/18  CBC with Differential/Platelet  Result Value Ref Range   WBC 4.9 4.0 - 10.5 K/uL   RBC 3.26 (L) 4.22 - 5.81 MIL/uL   Hemoglobin 10.3 (L) 13.0 - 17.0 g/dL   HCT 32.2 (L) 39.0 - 52.0 %   MCV 98.8 78.0 - 100.0 fL   MCH 31.6 26.0 - 34.0 pg   MCHC 32.0 30.0 - 36.0 g/dL   RDW 15.0 11.5 - 15.5 %   Platelets 314 150 - 400 K/uL   Neutrophils Relative % 75 %   Neutro Abs 3.6 1.7 - 7.7 K/uL   Lymphocytes Relative 17 %   Lymphs Abs 0.9 0.7 - 4.0 K/uL   Monocytes Relative 7 %   Monocytes Absolute 0.4 0.1 - 1.0 K/uL   Eosinophils Relative 1 %   Eosinophils Absolute 0.1 0.0 - 0.7 K/uL   Basophils Relative 0 %   Basophils Absolute 0.0 0.0 - 0.1 K/uL  Comprehensive metabolic panel  Result Value Ref Range   Sodium 135 135 - 145 mmol/L   Potassium 3.9 3.5 - 5.1 mmol/L   Chloride 101 101 - 111 mmol/L   CO2 22 22 - 32 mmol/L   Glucose, Bld 281 (H) 65 - 99 mg/dL   BUN 15 6 - 20 mg/dL   Creatinine, Ser 1.25 (H) 0.61 - 1.24 mg/dL   Calcium 8.4 (L) 8.9 - 10.3 mg/dL   Total Protein 7.1 6.5 - 8.1 g/dL   Albumin 3.1 (L) 3.5 - 5.0 g/dL   AST 20 15 - 41 U/L   ALT 19 17 - 63 U/L   Alkaline Phosphatase 51 38 - 126 U/L   Total Bilirubin 1.1 0.3 - 1.2 mg/dL   GFR calc non Af Amer 55 (L) >60 mL/min   GFR calc Af Amer >60 >60 mL/min   Anion gap 12 5 - 15  Troponin I  Result Value Ref Range   Troponin I <0.03 <0.03 ng/mL  Urinalysis, Routine w reflex microscopic  Result Value Ref Range   Color, Urine YELLOW YELLOW   APPearance HAZY (A) CLEAR   Specific Gravity, Urine 1.021 1.005 - 1.030   pH 5.0 5.0 - 8.0   Glucose,  UA NEGATIVE NEGATIVE mg/dL   Hgb urine dipstick MODERATE (A) NEGATIVE   Bilirubin Urine NEGATIVE NEGATIVE   Ketones, ur 5 (A) NEGATIVE mg/dL   Protein, ur 100 (A) NEGATIVE mg/dL   Nitrite NEGATIVE NEGATIVE   Leukocytes, UA NEGATIVE NEGATIVE   RBC / HPF 6-30 0 - 5 RBC/hpf   WBC, UA 0-5 0 - 5 WBC/hpf   Bacteria, UA NONE SEEN NONE SEEN   Squamous Epithelial / LPF NONE SEEN NONE SEEN  I-Stat CG4 Lactic Acid, ED  (not at  [facility])  Result Value Ref Range   Lactic Acid, Venous 1.91 (H) 0.5 - 1.9 mmol/L   Dg Chest 2 View  Result Date: 02/19/2018 CLINICAL DATA:  SOB for several days, legs swelling EXAM: CHEST - 2 VIEW COMPARISON:  08/05/2017 FINDINGS: Status post median sternotomy. Heart is UPPER normal in size. There is mild perihilar vascular prominence. No overt edema. No focal consolidations or pleural effusions. There is wedge deformity of numerous LOWER thoracic and UPPER lumbar vertebral bodies, stable in appearance. IMPRESSION: 1. Cardiomegaly and mild edema. 2. 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 3.  Recommend DXA bone density  exam if not previously performed. Electronically Signed   By: Nolon Nations M.D.   On: 02/19/2018 10:05   Ct Angio Chest Pe W And/or Wo Contrast  Result Date: 02/20/2018 CLINICAL DATA:  Shortness of breath for 1 month. EXAM: CT ANGIOGRAPHY CHEST WITH CONTRAST TECHNIQUE: Multidetector CT imaging of the chest was performed using the standard protocol during bolus administration of intravenous contrast. Multiplanar CT image reconstructions and MIPs were obtained to evaluate the vascular anatomy. CONTRAST:  132mL ISOVUE-370 IOPAMIDOL (ISOVUE-370) INJECTION 76% COMPARISON:  Abdominal CT 02/24/2017 FINDINGS: Cardiovascular: Satisfactory opacification of the pulmonary arteries to the segmental level. No evidence of pulmonary embolism. Normal heart size. No pericardial effusion. Extensive atherosclerotic calcification of the coronaries, status  post CABG. No acute aortic finding. Mediastinum/Nodes: Symmetric gynecomastia. Right hemithyroidectomy. Subcentimeter nodule in the lower pole left thyroid. Right hilar adenopathy that is asymmetric. Sub-carinal adenopathy measuring 22 mm short axis. These were not seen on a 02/25/2007 abdominal CT which covered much of the lower mediastinum. Lungs/Pleura: There is no edema, consolidation, effusion, or pneumothorax. Mild fissure thickening and occasional interlobular septal thickening which is likely atelectatic. No definite edema. Upper Abdomen: Cholecystectomy. Left upper pole renal cystic density. Musculoskeletal: No acute or aggressive finding. Review of the MIP images confirms the above findings. IMPRESSION: 1. Negative for pulmonary embolism or other acute finding. 2. Right hilar and subcarinal adenopathy not seen on a 2008 abdominal CT. No explainable findings on today's scan. If there is no explainable chronic lung disease or CHF on clinical grounds a follow-up CT could be obtained in 3-6 months. 3. Aortic Atherosclerosis (ICD10-I70.0). Electronically Signed   By: Monte Fantasia M.D.   On: 02/20/2018 09:26   Lab work consistent with chronic renal insufficiency, hyperglycemia, and anemia   Dr. Roderic Palau called who will arrange for overnight stay Final Clinical Impressions(s) / ED Diagnoses  Diagnoses #1 systemic inflammatory response syndrome (SIRS) #2 dyspnea #3 hyperglycemia #4 anemia #5 chronic renal insufficiency Final diagnoses:  None    ED Discharge Orders    None       Orlie Dakin, MD 02/20/18 [place], MD 02/20/18 1026 CRITICAL CARE Performed by: Orlie Dakin Total critical care time: 30 minutes Critical care time was exclusive of separately billable procedures and treating other patients. Critical care was necessary to treat or prevent imminent or life-threatening deterioration. Critical care was time spent personally by me on the following  activities: development of treatment plan with patient and/or surrogate as well as nursing, discussions with consultants, evaluation of patient's response to treatment, examination of patient, obtaining history from patient or surrogate, ordering and performing treatments and interventions, ordering and review of laboratory studies, ordering and review of radiographic studies, pulse oximetry and re-evaluation of patient's condition.   Orlie Dakin, MD 02/27/18 [id number]

## 2018-02-20 NOTE — Progress Notes (Signed)
Called to pt's room for CPAP not working. When I arrived the pt was on CPAP and it was working. I woke pt and asked him if he was having a problem. He states that the CPAP is working and he is not having a problem. Pt encouraged to call should he have any other issues.

## 2018-02-20 NOTE — Progress Notes (Signed)
Patient c/o  Difficulty breathing, patient lungs clear, diminished. MD notified. Vitals taken, WNL for patient. Patient does not appear to be having a hard time breathing, patient was lying flat upon entering room, sat patient up which seemed to help patient breath.

## 2018-02-20 NOTE — ED Notes (Signed)
Unable to obtain second iv access due to pt being a very difficult stick.

## 2018-02-20 NOTE — Progress Notes (Signed)
Pharmacy Antibiotic Note  [name] is a 77 y.o. male admitted on 02/20/2018 with sepsis.  Pharmacy has been consulted for Vancomycin and Zosyn dosing.  Plan:  Vancomycin 2000mg  x 1 then 1250mg  IV q12h Check trough at steady state Zosyn 3.375gm IV q8h, EID Monitor labs, renal fxn, progress and c/s Deescalate ABX when improved / appropriate.    Height: 5\' 8"  (172.7 cm) Weight: 292 lb (132.5 kg) IBW/kg (Calculated) : 68.4  Temp (24hrs), Avg:99.7 F (37.6 C), Min:98.2 F (36.8 C), Max:100.9 F (38.3 C)  Recent Labs  Lab 02/15/18 1112 02/19/18 0855 02/20/18 0757 02/20/18 0803  WBC 4.2 4.5 4.9  --   CREATININE 1.41* 1.26* 1.25*  --   LATICACIDVEN  --   --   --  1.91*    Estimated Creatinine Clearance: 67.9 mL/min (A) (by C-G formula based on SCr of 1.25 mg/dL (H)).    No Known Allergies  Antimicrobials this admission: Vancomycin 4/22 >>  Zosyn 4/22 >>   Dose adjustments this admission:  Microbiology results:  BCx: pending  UCx: pending   Sputum:    MRSA PCR:   Thank you for allowing pharmacy to be a part of this patient's care.  Hart Robinsons A 02/20/2018 10:52 AM

## 2018-02-20 NOTE — Progress Notes (Signed)
*  PRELIMINARY RESULTS* Echocardiogram 2D Echocardiogram WITH DEFINITY has been performed.  Leavy Cella 02/20/2018, 4:41 PM

## 2018-02-20 NOTE — ED Notes (Signed)
Pt took off his compression socks and his left ankle has twice the amount of swelling as his right.  Nonpitting edema.  Pt also c/o back pain and is requesting Tylenol, but advised he already got Tylenol for his fever.

## 2018-02-20 NOTE — ED Notes (Signed)
Fluids not given per sepsis protocol due to pt's hx of chf and diuresis yesterday per Dr. Lenna Sciara.

## 2018-02-20 NOTE — ED Notes (Signed)
Pt got his wife to let side rail down and in process pulled his IV out.

## 2018-02-20 NOTE — ED Triage Notes (Signed)
Pt reports he was here yesterday for shortness of breath and was given Lasix.  States he urinated much more, but shortness of breath is continuing to worsen.

## 2018-02-20 NOTE — ED Notes (Signed)
Patient transported to CT 

## 2018-02-20 NOTE — H&P (Signed)
History and Physical    [name] EQA:[id number] DOB: 05-11-1941 DOA: 02/20/2018  PCP: Alycia Rossetti, MD  Patient coming from: Home  I have personally briefly reviewed patient's old medical records in [place]  Chief Complaint: Shortness of breath  HPI: [name] is a 77 y.o. male with medical history significant of morbid obesity, obstructive sleep apnea, diabetes, hypertension, coronary artery disease status post CABG, presents to the emergency room with shortness of breath.  Patient was initially seen in the emergency room yesterday, 4/21, and complained of shortness of breath at that time.  During his evaluation, it was felt that he may have some evidence of volume overload and received a dose of Lasix.  He reported some improvement of his symptoms and return home.  When his symptoms persisted through the night and into the day today, he came back to the ER for evaluation.  Patient reports onset of progressive dyspnea on exertion as well as orthopnea for the past 2-3 weeks.  Denies any chest pain.  He is not had any significant cough.  He has not noticed any wheezing.  He reports that he does use an inhaler, and uses this approximately 2-3 times per day.  He is been using this for almost a year.  Does not report any chronic lung disease.  He quit smoking approximately 30 years ago after 15 years of smoking.  He does have chronic lower extremity edema, but does not feel that this is any worse than what it normally is at this time.  In fact it looks better than it does at baseline.[name]  He was noted to have a low-grade temperature in the emergency room.  He is not had any vomiting or diarrhea.  He is not had any sick contacts.  Denies any upper respiratory tract symptoms including sore throat, nasal congestion.  ED Course: In the emergency room, oxygen saturations were noted to be normal on room air, patient appeared to be very dyspneic on exertion.  He reported having difficulty  walking.  CT chest was performed that was negative for pulmonary embolism did not show any significant edema or if any evidence of pneumonia.  BNP was checked and was found to be in normal range.  He was noted to be mildly tachycardic.  He was noted to be mildly febrile.  Lactic acid was mildly elevated.  He did not appear septic or toxic.  EKG did not show any acute changes and troponin was negative.  Due to the persistence of his symptoms, admission for further workup was requested.  Review of Systems: As per HPI otherwise 10 point review of systems negative.    Past Medical History:  Diagnosis Date  . C. difficile diarrhea   . 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 Coronary artery disease    Status post CABG in 2007 [facility] [place])  . 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 GERD (gastroesophageal reflux disease)   . Glaucoma   . Hypertension   . Peripheral vascular disease ([place])   . Sleep apnea   . Stroke [facility]) 2008, 2014, 2015    Past Surgical History:  Procedure Laterality Date  . CHOLECYSTECTOMY    . CORONARY ARTERY BYPASS GRAFT    . NO PAST SURGERIES    . THYROID SURGERY       reports that he has quit smoking. He has never used  smokeless tobacco. He reports that he does not drink alcohol or use drugs.  No Known Allergies  Family History  Problem Relation Age of Onset  . Diabetes Mother   . Heart failure Mother   . Hypertension Mother   . Diabetes Father   . Heart failure Father   . Hypertension Father   . Diabetes Sister   . Heart failure Sister   . Hypertension Sister   . Hyperlipidemia Sister   . Diabetes Brother   . Heart failure Brother   . Hypertension Brother      Prior to Admission medications   Medication Sig Start Date End Date Taking? Authorizing Provider  acetaminophen (TYLENOL) 650 MG CR tablet Take 1,300 mg by mouth every 8 (eight) hours as needed for pain.   Yes  [provider]  Adalimumab 80 MG/0.8ML & 40MG /0.4ML PNKT Inject 1 Dose into the skin every 14 (fourteen) days. 09/21/17  Yes [provider]  amLODipine (NORVASC) 10 MG tablet Take 10 mg by mouth daily.   Yes [provider]  atorvastatin (LIPITOR) 80 MG tablet Take 40 mg by mouth every evening.    Yes [provider]  atropine 1 % ophthalmic solution Place 1 drop into the left eye daily.  03/28/17  Yes [provider]  Brinzolamide-Brimonidine (SIMBRINZA) 1-0.2 % SUSP Apply 1 drop to eye 2 (two) times daily.   Yes [provider]  carboxymethylcellulose (REFRESH PLUS) 0.5 % SOLN Place 1 drop into both eyes 4 (four) times daily as needed.   Yes [provider]  clotrimazole-betamethasone (LOTRISONE) cream Apply 1 application topically 2 (two) times daily. 12/02/17  Yes [place], Modena Nunnery, MD  dipyridamole-aspirin (AGGRENOX) 200-25 MG per 12 hr capsule Take 1 capsule by mouth 2 (two) times daily.   Yes [provider]  folic acid (FOLVITE) 1 MG tablet Take 1 mg by mouth. 03/14/17  Yes [provider]  furosemide (LASIX) 20 MG tablet Take 1 tablet (20 mg total) by mouth daily. 02/19/18  Yes Hayden Rasmussen, MD  insulin aspart protamine- aspart (NOVOLOG MIX 70/30) (70-30) 100 UNIT/ML injection Inject into the skin. Patient takes 40 units in the morning and 50 units at night.   Yes [provider]  Ipratropium-Albuterol (COMBIVENT RESPIMAT) 20-100 MCG/ACT AERS respimat Inhale 1 puff into the lungs every 6 (six) hours.   Yes [provider]  ketorolac (ACULAR) 0.5 % ophthalmic solution Place 1 drop into the right eye 4 (four) times daily.  03/10/17  Yes [provider]  losartan (COZAAR) 100 MG tablet Take 100 mg by mouth daily.     Yes [provider]  metFORMIN (GLUCOPHAGE) 1000 MG tablet Take 1,000 mg by mouth 2 (two) times daily with a meal.     Yes [provider]  methotrexate  (RHEUMATREX) 2.5 MG tablet Take 20 mg by mouth once a week. 06/23/17  Yes [provider]  metoprolol (LOPRESSOR) 50 MG tablet Take 0.5 tablets (25 mg total) by mouth 2 (two) times daily. FOR HEART/BLOOD PRESSURE. HOLD FOR SYSTOLIC BLOOD PRESSURE LESS THAN 1OO/HEART RATE LESS THAN 60 05/20/11  Yes Annita Brod, MD  pioglitazone (ACTOS) 30 MG tablet Take 30 mg by mouth daily.    Yes [provider]  potassium chloride (K-DUR,KLOR-CON) 10 MEQ tablet Take 2 tablets (20 mEq total) by mouth daily. Take with lasix 10/17/17  Yes [place], Modena Nunnery, MD  spironolactone (ALDACTONE) 25 MG tablet Take 25 mg by mouth daily.  Yes [provider]  tamsulosin (FLOMAX) 0.4 MG CAPS capsule Take 2 capsules (0.8 mg total) by mouth every evening. 06/09/16  Yes [place], Modena Nunnery, MD  timolol (BETIMOL) 0.5 % ophthalmic solution Place 1 drop into the left eye 2 (two) times daily.   Yes [provider]    Physical Exam: Vitals:   02/20/18 1037 02/20/18 1037 02/20/18 1125 02/20/18 1402  BP: (!) 113/50  (!) 134/57 139/64  Pulse: (!) 111  (!) 114 (!) 112  Resp: (!) 22  18 20   Temp:  98.2 F (36.8 C) 97.8 F (36.6 C) 97.8 F (36.6 C)  TempSrc:  Oral Oral Oral  SpO2: 99%  96% 100%  Weight:   127.2 kg (280 lb 6.8 oz)   Height:   5\' 8"  (1.727 m)     Constitutional: NAD, calm, comfortable Vitals:   02/20/18 1037 02/20/18 1037 02/20/18 1125 02/20/18 1402  BP: (!) 113/50  (!) 134/57 139/64  Pulse: (!) 111  (!) 114 (!) 112  Resp: (!) 22  18 20   Temp:  98.2 F (36.8 C) 97.8 F (36.6 C) 97.8 F (36.6 C)  TempSrc:  Oral Oral Oral  SpO2: 99%  96% 100%  Weight:   127.2 kg (280 lb 6.8 oz)   Height:   5\' 8"  (1.727 m)    Eyes: PERRL, lids and conjunctivae normal ENMT: Mucous membranes are moist. Posterior pharynx clear of any exudate or lesions.Normal dentition.  Neck: normal, supple, no masses, no thyromegaly Respiratory: Mild crackles at right base, mildly diminished.  No  wheezing.. Normal respiratory effort. No accessory muscle use.  Cardiovascular: Regular rate and rhythm, no murmurs / rubs / gallops.  1+ edema bilaterally. 2+ pedal pulses. No carotid bruits.  Abdomen: no tenderness, no masses palpated. No hepatosplenomegaly. Bowel sounds positive.  Musculoskeletal: no clubbing / cyanosis. No joint deformity upper and lower extremities. Good ROM, no contractures. Normal muscle tone.  Skin: no rashes, lesions, ulcers. No induration Neurologic: CN 2-12 grossly intact. Sensation intact, DTR normal. Strength 5/5 in all 4.  Psychiatric: Normal judgment and insight. Alert and oriented x 3. Normal mood.   Labs on Admission: I have personally reviewed following labs and imaging studies  CBC: Recent Labs  Lab 02/15/18 1112 02/19/18 0855 02/20/18 0757  WBC 4.2 4.5 4.9  NEUTROABS 2,524 3.4 3.6  HGB 10.2* 9.6* 10.3*  HCT 30.7* 30.1* 32.2*  MCV 96.2 99.7 98.8  PLT 254 262 297   Basic Metabolic Panel: Recent Labs  Lab 02/15/18 1112 02/19/18 0855 02/20/18 0757  NA 137 139 135  K 4.3 4.3 3.9  CL 102 105 101  CO2 24 22 22   GLUCOSE 163* 196* 281*  BUN 16 19 15   CREATININE 1.41* 1.26* 1.25*  CALCIUM 8.6 8.5* 8.4*   GFR: Estimated Creatinine Clearance: 66.4 mL/min (A) (by C-G formula based on SCr of 1.25 mg/dL (H)). Liver Function Tests: Recent Labs  Lab 02/15/18 1112 02/20/18 0757  AST 13 20  ALT 13 19  ALKPHOS  --  51  BILITOT 0.7 1.1  PROT 6.5 7.1  ALBUMIN  --  3.1*   No results for input(s): LIPASE, AMYLASE in the last 168 hours. No results for input(s): AMMONIA in the last 168 hours. Coagulation Profile: No results for input(s): INR, PROTIME in the last 168 hours. Cardiac Enzymes: Recent Labs  Lab 02/19/18 0855 02/20/18 0757 02/20/18 1609  TROPONINI <0.03 <0.03 <0.03   BNP (last 3 results) No results for input(s): PROBNP in  the last 8760 hours. HbA1C: No results for input(s): HGBA1C in the last 72 hours. CBG: Recent Labs  Lab  02/20/18 1625  GLUCAP 243*   Lipid Profile: No results for input(s): CHOL, HDL, LDLCALC, TRIG, CHOLHDL, LDLDIRECT in the last 72 hours. Thyroid Function Tests: No results for input(s): TSH, T4TOTAL, FREET4, T3FREE, THYROIDAB in the last 72 hours. Anemia Panel: No results for input(s): VITAMINB12, FOLATE, FERRITIN, TIBC, IRON, RETICCTPCT in the last 72 hours. Urine analysis:    Component Value Date/Time   COLORURINE YELLOW 02/20/2018 0742   APPEARANCEUR HAZY (A) 02/20/2018 0742   LABSPEC 1.021 02/20/2018 0742   PHURINE 5.0 02/20/2018 0742   GLUCOSEU NEGATIVE 02/20/2018 0742   HGBUR MODERATE (A) 02/20/2018 0742   BILIRUBINUR NEGATIVE 02/20/2018 0742   KETONESUR 5 (A) 02/20/2018 0742   PROTEINUR 100 (A) 02/20/2018 0742   UROBILINOGEN 0.2 03/28/2014 0145   NITRITE NEGATIVE 02/20/2018 0742   LEUKOCYTESUR NEGATIVE 02/20/2018 0742    Radiological Exams on Admission: Dg Chest 2 View  Result Date: 02/19/2018 CLINICAL DATA:  SOB for several days, legs swelling EXAM: CHEST - 2 VIEW COMPARISON:  08/05/2017 FINDINGS: Status post median sternotomy. Heart is UPPER normal in size. There is mild perihilar vascular prominence. No overt edema. No focal consolidations or pleural effusions. There is wedge deformity of numerous LOWER thoracic and UPPER lumbar vertebral bodies, stable in appearance. IMPRESSION: 1. Cardiomegaly and mild edema. 2. 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 3.  Recommend DXA bone density exam if not previously performed. Electronically Signed   By: Nolon Nations M.D.   On: 02/19/2018 10:05   Ct Angio Chest Pe W And/or Wo Contrast  Result Date: 02/20/2018 CLINICAL DATA:  Shortness of breath for 1 month. EXAM: CT ANGIOGRAPHY CHEST WITH CONTRAST TECHNIQUE: Multidetector CT imaging of the chest was performed using the standard protocol during bolus administration of intravenous contrast. Multiplanar CT image reconstructions and MIPs were obtained to  evaluate the vascular anatomy. CONTRAST:  163mL ISOVUE-370 IOPAMIDOL (ISOVUE-370) INJECTION 76% COMPARISON:  Abdominal CT 02/24/2017 FINDINGS: Cardiovascular: Satisfactory opacification of the pulmonary arteries to the segmental level. No evidence of pulmonary embolism. Normal heart size. No pericardial effusion. Extensive atherosclerotic calcification of the coronaries, status post CABG. No acute aortic finding. Mediastinum/Nodes: Symmetric gynecomastia. Right hemithyroidectomy. Subcentimeter nodule in the lower pole left thyroid. Right hilar adenopathy that is asymmetric. Sub-carinal adenopathy measuring 22 mm short axis. These were not seen on a 02/25/2007 abdominal CT which covered much of the lower mediastinum. Lungs/Pleura: There is no edema, consolidation, effusion, or pneumothorax. Mild fissure thickening and occasional interlobular septal thickening which is likely atelectatic. No definite edema. Upper Abdomen: Cholecystectomy. Left upper pole renal cystic density. Musculoskeletal: No acute or aggressive finding. Review of the MIP images confirms the above findings. IMPRESSION: 1. Negative for pulmonary embolism or other acute finding. 2. Right hilar and subcarinal adenopathy not seen on a 2008 abdominal CT. No explainable findings on today's scan. If there is no explainable chronic lung disease or CHF on clinical grounds a follow-up CT could be obtained in 3-6 months. 3. Aortic Atherosclerosis (ICD10-I70.0). Electronically Signed   By: Monte Fantasia M.D.   On: 02/20/2018 09:26    EKG: Independently reviewed.  Sinus rhythm without acute changes  Assessment/Plan Active Problems:   HTN (hypertension)   DM (diabetes mellitus) type II controlled with renal manifestation (HCC)   CAD (coronary artery disease)   Hyperlipidemia   OSA (obstructive sleep apnea)   Chronic diastolic  heart failure (HCC)   Dyspnea   BPH (benign prostatic hyperplasia)   1. Dyspnea on exertion.  Etiology is unclear at  this point.  BNP is normal and CT imaging did not show any significant findings.  Will check echocardiogram.  He reports using bronchodilators on a daily basis.  Does not have any wheezing at this time.  Will give a trial of nebulizer treatments and mucolytic's.  If he improves with this, he would benefit from outpatient pulmonary function test.  Monitor for any signs of infection, although he does not appear septic at this time.  Will cycle cardiac markers 2. Obstructive sleep apnea.  Continue on CPAP 3. Diabetes.  Continue on home dose of 70/30.  Supplemental sliding scale.  Continue oral hypoglycemics. 4. Hypertension.  Blood pressures currently stable.  Continue outpatient medications. 5. Hyperlipidemia.  Continue statin 6. BPH.  Continue on Flomax 7. Chronic diastolic heart failure.  No obvious signs of significant decompensation.  Will continue on home dose of Lasix.] 8. Coronary artery disease status post CABG.  No complaints of chest pain at this time.  Continue to cycle troponins.  DVT prophylaxis: Lovenox Code Status: Full code Family Communication: Discussed with wife at the bedside Disposition Plan: Discharge home once respiratory status has improved Consults called:  Admission status: observation, tele   Kathie Dike MD [facility] Pager [phone]  If 7PM-7AM, please contact night-coverage [email or web address] Password [facility]  02/20/2018, 6:20 PM

## 2018-02-20 NOTE — ED Notes (Signed)
Pt reports his breathing feels worse at this time.  Lung sounds remain clear but diminished.

## 2018-02-21 DIAGNOSIS — I5032 Chronic diastolic (congestive) heart failure: Secondary | ICD-10-CM | POA: Diagnosis not present

## 2018-02-21 DIAGNOSIS — N183 Chronic kidney disease, stage 3 (moderate): Secondary | ICD-10-CM | POA: Diagnosis not present

## 2018-02-21 DIAGNOSIS — R0600 Dyspnea, unspecified: Secondary | ICD-10-CM | POA: Diagnosis not present

## 2018-02-21 DIAGNOSIS — G4733 Obstructive sleep apnea (adult) (pediatric): Secondary | ICD-10-CM | POA: Diagnosis not present

## 2018-02-21 DIAGNOSIS — I2510 Atherosclerotic heart disease of native coronary artery without angina pectoris: Secondary | ICD-10-CM | POA: Diagnosis not present

## 2018-02-21 DIAGNOSIS — I10 Essential (primary) hypertension: Secondary | ICD-10-CM | POA: Diagnosis not present

## 2018-02-21 DIAGNOSIS — Z794 Long term (current) use of insulin: Secondary | ICD-10-CM | POA: Diagnosis not present

## 2018-02-21 DIAGNOSIS — E1122 Type 2 diabetes mellitus with diabetic chronic kidney disease: Secondary | ICD-10-CM | POA: Diagnosis not present

## 2018-02-21 DIAGNOSIS — E782 Mixed hyperlipidemia: Secondary | ICD-10-CM | POA: Diagnosis not present

## 2018-02-21 LAB — BASIC METABOLIC PANEL
Anion gap: 12 (ref 5–15)
BUN: 13 mg/dL (ref 6–20)
CO2: 23 mmol/L (ref 22–32)
Calcium: 8.4 mg/dL — ABNORMAL LOW (ref 8.9–10.3)
Chloride: 104 mmol/L (ref 101–111)
Creatinine, Ser: 1.27 mg/dL — ABNORMAL HIGH (ref 0.61–1.24)
GFR calc Af Amer: >60 mL/min (ref >60)
GFR calc non Af Amer: 54 mL/min — ABNORMAL LOW (ref >60)
Glucose, Bld: 127 mg/dL — ABNORMAL HIGH (ref 65–99)
Potassium: 4.1 mmol/L (ref 3.5–5.1)
Sodium: 139 mmol/L (ref 135–145)

## 2018-02-21 LAB — CBC
HCT: 32.9 % — ABNORMAL LOW (ref 39.0–52.0)
Hemoglobin: 10.3 g/dL — ABNORMAL LOW (ref 13.0–17.0)
MCH: 31 pg (ref 26.0–34.0)
MCHC: 31.3 g/dL (ref 30.0–36.0)
MCV: 99.1 fL (ref 78.0–100.0)
Platelets: 372 10*3/uL (ref 150–400)
RBC: 3.32 MIL/uL — ABNORMAL LOW (ref 4.22–5.81)
RDW: 15 % (ref 11.5–15.5)
WBC: 4.7 10*3/uL (ref 4.0–10.5)

## 2018-02-21 LAB — GLUCOSE, CAPILLARY
Glucose-Capillary: 150 mg/dL — ABNORMAL HIGH (ref 65–99)
Glucose-Capillary: 163 mg/dL — ABNORMAL HIGH (ref 65–99)
Glucose-Capillary: 170 mg/dL — ABNORMAL HIGH (ref 65–99)

## 2018-02-21 LAB — TROPONIN I: Troponin I: <0.03 ng/mL (ref <0.03)

## 2018-02-21 MED ORDER — ENOXAPARIN SODIUM 60 MG/0.6ML ~~LOC~~ SOLN: 60.0000 mg | SUBCUTANEOUS | Status: DC

## 2018-02-21 NOTE — Plan of Care (Signed)
Progressing

## 2018-02-21 NOTE — Progress Notes (Signed)
Removed IV-clean, dry, intact. Reviewed d/c paperwork with pt and wife. Reviewed heart failure protocol. Answered all questions. Wheeled stable pt and belongings to main entrance.

## 2018-02-21 NOTE — Discharge Summary (Signed)
Physician Discharge Summary  [name]:[id number] DOB: May 21, 1941 DOA: 02/20/2018  PCP: Charles Rossetti, MD  Admit date: 02/20/2018 Discharge date: 02/21/2018  Time spent: 35 minutes  Recommendations for Outpatient Follow-up:  1. Repeat basic metabolic panel to follow electrolytes and renal function 2. Outpatient visit with pulmonologist for PFTs and also recommendations for rheumatologic workup (given need for biologic agents and methotrexate for underlying uveitis, raising concerns for underlying tissue concetive disorder or other rheumatologic condition)  3. Please see below for further recommendations that could potentially help future will begin on [name]   Discharge Diagnoses:  Active Problems:   HTN (hypertension)   DM (diabetes mellitus) type II controlled with renal manifestation (HCC)   CAD (coronary artery disease)   Hyperlipidemia   Obesity, Class III, BMI 40-49.9 (morbid obesity) (HCC)   OSA (obstructive sleep apnea)   Chronic diastolic heart failure (HCC)   Dyspnea   BPH (benign prostatic hyperplasia)   Discharge Condition: Stable and improved.  Patient discharged home with instruction to follow-up with PCP in 10 days.  Diet recommendation: Low calorie, modified carbohydrate and heart healthy diet.  Filed Weights   02/20/18 0728 02/20/18 0743 02/20/18 1125  Weight: 132.5 kg (292 lb) 132.5 kg (292 lb) 127.2 kg (280 lb 6.8 oz)    History of present illness:  As per H&P written by Dr. Roderic [name] on 02/20/18 77 y.o. male with medical history significant of morbid obesity, obstructive sleep apnea, diabetes, hypertension, coronary artery disease status post CABG, presents to the emergency room with shortness of breath.  Patient was initially seen in the emergency room yesterday, 4/21, and complained of shortness of breath at that time.  During his evaluation, it was felt that he may have some evidence of volume overload and received a dose of Lasix.  He reported some  improvement of his symptoms and return home.  When his symptoms persisted through the night and into the day today, he came back to the ER for evaluation.  Patient reports onset of progressive dyspnea on exertion as well as orthopnea for the past 2-3 weeks.  Denies any chest pain.  He is not had any significant cough.  He has not noticed any wheezing.  He reports that he does use an inhaler, and uses this approximately 2-3 times per day.  He is been using this for almost a year.  Does not report any chronic lung disease.  He quit smoking approximately 30 years ago after 15 years of smoking.  He does have chronic lower extremity edema, but does not feel that this is any worse than what it normally is at this time.  In fact it looks better than it does at baseline.[name]  He was noted to have a low-grade temperature in the emergency room.  He is not had any vomiting or diarrhea.  He is not had any sick contacts.  Denies any upper respiratory tract symptoms including sore throat, nasal congestion.   Hospital Course:  1-shortness of breath/dyspnea on exertion -Patient with normal BNP (despite potential falls normal levels in obese patients), chest x-ray and CT of the chest demonstrating no acute abnormalities or findings that would explain his shortness of breath. -Negative troponin and no ischemic changes appreciated on telemetry or EKG. -2D echo was also reassuring. -Patient symptoms improved with nebulizer treatments and continuation of diuretics. -Patient most likely with underlying shortness of breath due to deconditioning and obesity hypoventilation syndrome -There is also concerns for potential underlying rheumatologic process as  a recent for these shortness of breath of unclear etiology.  Would recommend outpatient follow-up with the rheumatology service. -patient stable to discharge home -Instructed to follow low-sodium diet, to lose weight, to be religiously compliant with his sleep apnea and to take  medications as prescribed.  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  4-type 2 diabetes with nephropathy  -We will resume home hypoglycemic regimen and follow-up with PCP for further adjustment as needed. -One potential consideration is stopping pioglitazone (at this has been resulted in no good outcome for patient with underlying heart failure).  5-chronic kidney disease stage III: In the setting of type 2 diabetes mellitus -Appears to be at baseline -Creatinine 1.25 -Advised to keep himself well-hydrated -Repeat basic metabolic panel in the near future to reassess electrolytes and renal function trend.  6-hypertension: -Stable and well-controlled -Continue current antihypertensive regimen -Heart healthy diet has been encouraged  7-BPH -Continue Flomax. -No signs or symptoms of urinary retention appreciated.  8-coronary artery disease a status post CABG -Patient without chest pain -As mentioned above negative troponin, no acute ischemic changes on EKG, telemetry  or 2D echo.  9-HLD -continue statins    Procedures:  2- D echo - Left ventricle: Images are limited despite use of Definity. The   cavity size was normal. Wall thickness was increased in a pattern   of moderate LVH. The estimated ejection fraction was   approximately 55%. Images were inadequate for LV wall motion   assessment. Indeterminate diastolic function. Rhythm looks to be   possible atrial flutter. - Aortic valve: Mildly calcified annulus. Trileaflet; mildly   calcified leaflets. - Mitral valve: Mildly calcified annulus. There was trivial   regurgitation. - Right ventricle: Poorly visualized. The cavity size was normal. - Right atrium: Central venous pressure (est): 3 mm Hg. -  Tricuspid valve: There was trivial regurgitation. - Pulmonary arteries: PA peak pressure: 30 mm Hg (S). - Pericardium, extracardiac: A prominent pericardial fat pad was   present.  Consultations:  None  Discharge Exam: Vitals:   02/21/18 1331 02/21/18 1500  BP: 120/82   Pulse: (!) 121   Resp: 20   Temp: 98.7 F (37.1 C)   SpO2: 98% 95%    General: Afebrile, denies chest pain, patient currently without complaints of shortness of breath while at rest and during my examination was laying down flat on the bed.  No nausea, no vomiting, no abdominal pain, no dysuria or any other complaints. Cardiovascular: S1 and S2 appreciated on exam, no rubs, no gallops, no murmurs, JVD unable to be properly assess secondary to body habitus. Respiratory: Good air movement bilaterally, no wheezing, no crackles. Abdomen: Obese, soft, nontender, nondistended, positive bowel sounds. Extremities: 1+ edema bilaterally up to his knees, no cyanosis or clubbing, no open wounds or induration. Neurologic exam: Alert, awake and oriented x3, cranial nerve grossly intact, no focal deficits appreciated on exam.  Discharge Instructions   Discharge Instructions    (HEART FAILURE PATIENTS) Call MD:  Anytime you have any of the following symptoms: 1) 3 pound weight gain in 24 hours or 5 pounds in 1 week 2) shortness of breath, with or without a dry hacking cough 3) swelling in the hands, feet or stomach 4) if you have to sleep on extra pillows at night in order  to breathe.   Complete by:  As directed    Diet - low sodium heart healthy   Complete by:  As directed    Discharge instructions   Complete by:  As directed    Take medications as prescribed Follow low-sodium diet Arrange follow-up with PCP in 10 days Maintain adequate hydration     Allergies as of 02/21/2018   No Known Allergies     Medication List    TAKE these medications   acetaminophen 650 MG CR tablet Commonly known as:  TYLENOL Take 1,300  mg by mouth every 8 (eight) hours as needed for pain.   Adalimumab 80 MG/0.8ML & 40MG /0.4ML Pnkt Inject 1 Dose into the skin every 14 (fourteen) days.   amLODipine 10 MG tablet Commonly known as:  NORVASC Take 10 mg by mouth daily.   atorvastatin 80 MG tablet Commonly known as:  LIPITOR Take 40 mg by mouth every evening.   atropine 1 % ophthalmic solution Place 1 drop into the left eye daily.   carboxymethylcellulose 0.5 % Soln Commonly known as:  REFRESH PLUS Place 1 drop into both eyes 4 (four) times daily as needed.   clotrimazole-betamethasone cream Commonly known as:  LOTRISONE Apply 1 application topically 2 (two) times daily.   COMBIVENT RESPIMAT 20-100 MCG/ACT Aers respimat Generic drug:  Ipratropium-Albuterol Inhale 1 puff into the lungs every 6 (six) hours.   dipyridamole-aspirin 200-25 MG 12hr capsule Commonly known as:  AGGRENOX Take 1 capsule by mouth 2 (two) times daily.   folic acid 1 MG tablet Commonly known as:  FOLVITE Take 1 mg by mouth.   furosemide 20 MG tablet Commonly known as:  LASIX Take 1 tablet (20 mg total) by mouth daily.   insulin aspart protamine- aspart (70-30) 100 UNIT/ML injection Commonly known as:  NOVOLOG MIX 70/30 Inject into the skin. Patient takes 40 units in the morning and 50 units at night.   ketorolac 0.5 % ophthalmic solution Commonly known as:  ACULAR Place 1 drop into the right eye 4 (four) times daily.   losartan 100 MG tablet Commonly known as:  COZAAR Take 100 mg by mouth daily.   metFORMIN 1000 MG tablet Commonly known as:  GLUCOPHAGE Take 1,000 mg by mouth 2 (two) times daily with a meal.   methotrexate 2.5 MG tablet Commonly known as:  RHEUMATREX Take 20 mg by mouth once a week.   metoprolol tartrate 50 MG tablet Commonly known as:  LOPRESSOR Take 0.5 tablets (25 mg total) by mouth 2 (two) times daily. FOR HEART/BLOOD PRESSURE. HOLD FOR SYSTOLIC BLOOD PRESSURE LESS THAN 1OO/HEART RATE LESS THAN 60    pioglitazone 30 MG tablet Commonly known as:  ACTOS Take 30 mg by mouth daily.   potassium chloride 10 MEQ tablet Commonly known as:  K-DUR,KLOR-CON Take 2 tablets (20 mEq total) by mouth daily. Take with lasix   SIMBRINZA 1-0.2 % Susp Generic drug:  Brinzolamide-Brimonidine Apply 1 drop to eye 2 (two) times daily.   spironolactone 25 MG tablet Commonly known as:  ALDACTONE Take 25 mg by mouth daily.   tamsulosin 0.4 MG Caps capsule Commonly known as:  FLOMAX Take 2 capsules (0.8 mg total) by mouth every evening.   timolol 0.5 % ophthalmic solution Commonly known as:  BETIMOL Place 1 drop into the left eye 2 (two) times daily.      No Known Allergies Follow-up Information    [place], Modena Nunnery, MD. Schedule an appointment as soon as possible for a  visit in 10 day(s).   Specialty:  Family Medicine Contact information: [address], [place] [address] [phone]           The results of significant diagnostics from this hospitalization (including imaging, microbiology, ancillary and laboratory) are listed below for reference.    Significant Diagnostic Studies: Dg Chest 2 View  Result Date: 02/19/2018 CLINICAL DATA:  SOB for several days, legs swelling EXAM: CHEST - 2 VIEW COMPARISON:  08/05/2017 FINDINGS: Status post median sternotomy. Heart is UPPER normal in size. There is mild perihilar vascular prominence. No overt edema. No focal consolidations or pleural effusions. There is wedge deformity of numerous LOWER thoracic and UPPER lumbar vertebral bodies, stable in appearance. IMPRESSION: 1. Cardiomegaly and mild edema. 2. 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 3.  Recommend DXA bone density exam if not previously performed. Electronically Signed   By: Nolon Nations M.D.   On: 02/19/2018 10:05   Ct Angio Chest Pe W And/or Wo Contrast  Result Date: 02/20/2018 CLINICAL DATA:  Shortness of breath for 1 month. EXAM: CT  ANGIOGRAPHY CHEST WITH CONTRAST TECHNIQUE: Multidetector CT imaging of the chest was performed using the standard protocol during bolus administration of intravenous contrast. Multiplanar CT image reconstructions and MIPs were obtained to evaluate the vascular anatomy. CONTRAST:  166mL ISOVUE-370 IOPAMIDOL (ISOVUE-370) INJECTION 76% COMPARISON:  Abdominal CT 02/24/2017 FINDINGS: Cardiovascular: Satisfactory opacification of the pulmonary arteries to the segmental level. No evidence of pulmonary embolism. Normal heart size. No pericardial effusion. Extensive atherosclerotic calcification of the coronaries, status post CABG. No acute aortic finding. Mediastinum/Nodes: Symmetric gynecomastia. Right hemithyroidectomy. Subcentimeter nodule in the lower pole left thyroid. Right hilar adenopathy that is asymmetric. Sub-carinal adenopathy measuring 22 mm short axis. These were not seen on a 02/25/2007 abdominal CT which covered much of the lower mediastinum. Lungs/Pleura: There is no edema, consolidation, effusion, or pneumothorax. Mild fissure thickening and occasional interlobular septal thickening which is likely atelectatic. No definite edema. Upper Abdomen: Cholecystectomy. Left upper pole renal cystic density. Musculoskeletal: No acute or aggressive finding. Review of the MIP images confirms the above findings. IMPRESSION: 1. Negative for pulmonary embolism or other acute finding. 2. Right hilar and subcarinal adenopathy not seen on a 2008 abdominal CT. No explainable findings on today's scan. If there is no explainable chronic lung disease or CHF on clinical grounds a follow-up CT could be obtained in 3-6 months. 3. Aortic Atherosclerosis (ICD10-I70.0). Electronically Signed   By: Monte Fantasia M.D.   On: 02/20/2018 09:26    Microbiology: Recent Results (from the past 240 hour(s))  Blood Culture (routine x 2)     Status: None (Preliminary result)   Collection Time: 02/20/18  7:59 AM  Result Value Ref Range  Status   Specimen Description BLOOD LEFT FOREARM  Final   Special Requests   Final    BOTTLES DRAWN AEROBIC AND ANAEROBIC Blood Culture adequate volume   Culture   Final    NO GROWTH 1 DAY Performed at [facility], [address], [place], [place] [address]    Report Status PENDING  Incomplete  Blood Culture (routine x 2)     Status: None (Preliminary result)   Collection Time: 02/20/18  8:07 AM  Result Value Ref Range Status   Specimen Description BLOOD RIGHT ARM  Final   Special Requests   Final    BOTTLES DRAWN AEROBIC AND ANAEROBIC Blood Culture adequate volume   Culture   Final  NO GROWTH 1 DAY Performed at [facility], [address], [place], [place] [address]    Report Status PENDING  Incomplete     Labs: Basic Metabolic Panel: Recent Labs  Lab 02/15/18 1112 02/19/18 0855 02/20/18 0757 02/21/18 0338  NA 137 139 135 139  K 4.3 4.3 3.9 4.1  CL 102 105 101 104  CO2 24 22 22 23   GLUCOSE 163* 196* 281* 127*  BUN 16 19 15 13   CREATININE 1.41* 1.26* 1.25* 1.27*  CALCIUM 8.6 8.5* 8.4* 8.4*   Liver Function Tests: Recent Labs  Lab 02/15/18 1112 02/20/18 0757  AST 13 20  ALT 13 19  ALKPHOS  --  51  BILITOT 0.7 1.1  PROT 6.5 7.1  ALBUMIN  --  3.1*   CBC: Recent Labs  Lab 02/15/18 1112 02/19/18 0855 02/20/18 0757 02/21/18 0338  WBC 4.2 4.5 4.9 4.7  NEUTROABS 2,524 3.4 3.6  --   HGB 10.2* 9.6* 10.3* 10.3*  HCT 30.7* 30.1* 32.2* 32.9*  MCV 96.2 99.7 98.8 99.1  PLT 254 262 314 372   Cardiac Enzymes: Recent Labs  Lab 02/19/18 0855 02/20/18 0757 02/20/18 1609 02/20/18 2218 02/21/18 0338  TROPONINI <0.03 <0.03 <0.03 <0.03 <0.03   BNP: BNP (last 3 results) Recent Labs    04/13/17 1110 02/15/18 1112 02/19/18 0855  BNP 61.3 87 62.0    CBG: Recent Labs  Lab 02/20/18 1625 02/20/18 2121 02/21/18 0748 02/21/18 1130  GLUCAP 243* 128* 150* 163*    Signed:  Barton Dubois MD.  [facility] 02/21/2018, 4:22 PM

## 2018-02-21 NOTE — Care Management Obs Status (Signed)
[place] NOTIFICATION   Patient Details  Name: SHAKIM FAITH MRN: [id number] Date of Birth: 04/01/1941   Medicare Observation Status Notification Given:  Yes    Jonetta Dagley, Chauncey Reading, RN 02/21/2018, 2:30 PM

## 2018-02-24 ENCOUNTER — Inpatient Hospital Stay (HOSPITAL_COMMUNITY)
Admission: EM | Admit: 2018-02-24 | Discharge: 2018-03-03 | DRG: 871 | Disposition: A | Discharge status: Home / Self Care | Payer: Non-veteran care | Attending: Internal Medicine | Admitting: Internal Medicine

## 2018-02-24 ENCOUNTER — Emergency Department (HOSPITAL_COMMUNITY): Payer: Non-veteran care

## 2018-02-24 ENCOUNTER — Encounter (HOSPITAL_COMMUNITY): Payer: Self-pay | Admitting: Emergency Medicine

## 2018-02-24 ENCOUNTER — Other Ambulatory Visit: Payer: Self-pay

## 2018-02-24 DIAGNOSIS — H409 Unspecified glaucoma: Secondary | ICD-10-CM | POA: Diagnosis present

## 2018-02-24 DIAGNOSIS — R0902 Hypoxemia: Secondary | ICD-10-CM

## 2018-02-24 DIAGNOSIS — I5032 Chronic diastolic (congestive) heart failure: Secondary | ICD-10-CM | POA: Diagnosis present

## 2018-02-24 DIAGNOSIS — E1122 Type 2 diabetes mellitus with diabetic chronic kidney disease: Secondary | ICD-10-CM | POA: Diagnosis not present

## 2018-02-24 DIAGNOSIS — Z6841 Body Mass Index (BMI) 40.0 and over, adult: Secondary | ICD-10-CM | POA: Diagnosis not present

## 2018-02-24 DIAGNOSIS — R05 Cough: Secondary | ICD-10-CM | POA: Diagnosis not present

## 2018-02-24 DIAGNOSIS — N400 Enlarged prostate without lower urinary tract symptoms: Secondary | ICD-10-CM | POA: Diagnosis present

## 2018-02-24 DIAGNOSIS — Z951 Presence of aortocoronary bypass graft: Secondary | ICD-10-CM

## 2018-02-24 DIAGNOSIS — E162 Hypoglycemia, unspecified: Secondary | ICD-10-CM | POA: Diagnosis not present

## 2018-02-24 DIAGNOSIS — Z8673 Personal history of transient ischemic attack (TIA), and cerebral infarction without residual deficits: Secondary | ICD-10-CM | POA: Diagnosis not present

## 2018-02-24 DIAGNOSIS — E11649 Type 2 diabetes mellitus with hypoglycemia without coma: Secondary | ICD-10-CM | POA: Diagnosis not present

## 2018-02-24 DIAGNOSIS — Z8249 Family history of ischemic heart disease and other diseases of the circulatory system: Secondary | ICD-10-CM

## 2018-02-24 DIAGNOSIS — E1151 Type 2 diabetes mellitus with diabetic peripheral angiopathy without gangrene: Secondary | ICD-10-CM | POA: Diagnosis present

## 2018-02-24 DIAGNOSIS — E66813 Obesity, class 3: Secondary | ICD-10-CM | POA: Diagnosis present

## 2018-02-24 DIAGNOSIS — J9690 Respiratory failure, unspecified, unspecified whether with hypoxia or hypercapnia: Secondary | ICD-10-CM

## 2018-02-24 DIAGNOSIS — J189 Pneumonia, unspecified organism: Secondary | ICD-10-CM | POA: Diagnosis present

## 2018-02-24 DIAGNOSIS — G4733 Obstructive sleep apnea (adult) (pediatric): Secondary | ICD-10-CM | POA: Diagnosis present

## 2018-02-24 DIAGNOSIS — N183 Chronic kidney disease, stage 3 (moderate): Secondary | ICD-10-CM | POA: Diagnosis present

## 2018-02-24 DIAGNOSIS — A419 Sepsis, unspecified organism: Principal | ICD-10-CM | POA: Diagnosis present

## 2018-02-24 DIAGNOSIS — E1129 Type 2 diabetes mellitus with other diabetic kidney complication: Secondary | ICD-10-CM | POA: Diagnosis present

## 2018-02-24 DIAGNOSIS — Z833 Family history of diabetes mellitus: Secondary | ICD-10-CM

## 2018-02-24 DIAGNOSIS — Z79899 Other long term (current) drug therapy: Secondary | ICD-10-CM

## 2018-02-24 DIAGNOSIS — E872 Acidosis, unspecified: Secondary | ICD-10-CM | POA: Diagnosis present

## 2018-02-24 DIAGNOSIS — E785 Hyperlipidemia, unspecified: Secondary | ICD-10-CM | POA: Diagnosis present

## 2018-02-24 DIAGNOSIS — I2510 Atherosclerotic heart disease of native coronary artery without angina pectoris: Secondary | ICD-10-CM | POA: Diagnosis not present

## 2018-02-24 DIAGNOSIS — M5136 Other intervertebral disc degeneration, lumbar region: Secondary | ICD-10-CM | POA: Diagnosis present

## 2018-02-24 DIAGNOSIS — R0603 Acute respiratory distress: Secondary | ICD-10-CM | POA: Diagnosis not present

## 2018-02-24 DIAGNOSIS — I130 Hypertensive heart and chronic kidney disease with heart failure and stage 1 through stage 4 chronic kidney disease, or unspecified chronic kidney disease: Secondary | ICD-10-CM | POA: Diagnosis present

## 2018-02-24 DIAGNOSIS — E662 Morbid (severe) obesity with alveolar hypoventilation: Secondary | ICD-10-CM | POA: Diagnosis present

## 2018-02-24 DIAGNOSIS — Z9049 Acquired absence of other specified parts of digestive tract: Secondary | ICD-10-CM

## 2018-02-24 DIAGNOSIS — N179 Acute kidney failure, unspecified: Secondary | ICD-10-CM | POA: Diagnosis not present

## 2018-02-24 DIAGNOSIS — R0600 Dyspnea, unspecified: Secondary | ICD-10-CM | POA: Diagnosis not present

## 2018-02-24 DIAGNOSIS — Z794 Long term (current) use of insulin: Secondary | ICD-10-CM | POA: Diagnosis not present

## 2018-02-24 DIAGNOSIS — K219 Gastro-esophageal reflux disease without esophagitis: Secondary | ICD-10-CM | POA: Diagnosis present

## 2018-02-24 DIAGNOSIS — I10 Essential (primary) hypertension: Secondary | ICD-10-CM | POA: Diagnosis present

## 2018-02-24 DIAGNOSIS — N170 Acute kidney failure with tubular necrosis: Secondary | ICD-10-CM | POA: Diagnosis not present

## 2018-02-24 DIAGNOSIS — Z87891 Personal history of nicotine dependence: Secondary | ICD-10-CM

## 2018-02-24 DIAGNOSIS — R0602 Shortness of breath: Secondary | ICD-10-CM | POA: Diagnosis not present

## 2018-02-24 DIAGNOSIS — N189 Chronic kidney disease, unspecified: Secondary | ICD-10-CM | POA: Diagnosis not present

## 2018-02-24 DIAGNOSIS — J181 Lobar pneumonia, unspecified organism: Secondary | ICD-10-CM | POA: Diagnosis not present

## 2018-02-24 DIAGNOSIS — N182 Chronic kidney disease, stage 2 (mild): Secondary | ICD-10-CM | POA: Diagnosis not present

## 2018-02-24 DIAGNOSIS — J9601 Acute respiratory failure with hypoxia: Secondary | ICD-10-CM | POA: Diagnosis not present

## 2018-02-24 DIAGNOSIS — N200 Calculus of kidney: Secondary | ICD-10-CM | POA: Diagnosis not present

## 2018-02-24 LAB — GLUCOSE, CAPILLARY
Glucose-Capillary: 33 mg/dL — CL (ref 65–99)
Glucose-Capillary: 100 mg/dL — ABNORMAL HIGH (ref 65–99)
Glucose-Capillary: 92 mg/dL (ref 65–99)
Glucose-Capillary: 85 mg/dL (ref 65–99)
Glucose-Capillary: 133 mg/dL — ABNORMAL HIGH (ref 65–99)

## 2018-02-24 LAB — COMPREHENSIVE METABOLIC PANEL
ALT: 22 U/L (ref 17–63)
AST: 25 U/L (ref 15–41)
Albumin: 3 g/dL — ABNORMAL LOW (ref 3.5–5.0)
Alkaline Phosphatase: 52 U/L (ref 38–126)
Anion gap: 14 (ref 5–15)
BUN: 28 mg/dL — ABNORMAL HIGH (ref 6–20)
CO2: 22 mmol/L (ref 22–32)
Calcium: 8.7 mg/dL — ABNORMAL LOW (ref 8.9–10.3)
Chloride: 101 mmol/L (ref 101–111)
Creatinine, Ser: 1.67 mg/dL — ABNORMAL HIGH (ref 0.61–1.24)
GFR calc Af Amer: 45 mL/min — ABNORMAL LOW (ref >60)
GFR calc non Af Amer: 38 mL/min — ABNORMAL LOW (ref >60)
Glucose, Bld: 51 mg/dL — ABNORMAL LOW (ref 65–99)
Potassium: 3.9 mmol/L (ref 3.5–5.1)
Sodium: 137 mmol/L (ref 135–145)
Total Bilirubin: 0.7 mg/dL (ref 0.3–1.2)
Total Protein: 7.1 g/dL (ref 6.5–8.1)

## 2018-02-24 LAB — CBC WITH DIFFERENTIAL/PLATELET
Basophils Absolute: 0.1 10*3/uL (ref 0.0–0.1)
Basophils Relative: 1 %
Eosinophils Absolute: 0.1 10*3/uL (ref 0.0–0.7)
Eosinophils Relative: 1 %
HCT: 30.3 % — ABNORMAL LOW (ref 39.0–52.0)
Hemoglobin: 9.6 g/dL — ABNORMAL LOW (ref 13.0–17.0)
Lymphocytes Relative: 20 %
Lymphs Abs: 2.1 10*3/uL (ref 0.7–4.0)
MCH: 31.1 pg (ref 26.0–34.0)
MCHC: 31.7 g/dL (ref 30.0–36.0)
MCV: 98.1 fL (ref 78.0–100.0)
Monocytes Absolute: 1.3 10*3/uL (ref 0.1–1.0)
Monocytes Relative: 12 %
Neutro Abs: 7.2 10*3/uL (ref 1.7–7.7)
Neutrophils Relative %: 66 %
Platelets: 342 10*3/uL (ref 150–400)
RBC: 3.09 MIL/uL — ABNORMAL LOW (ref 4.22–5.81)
RDW: 15.5 % (ref 11.5–15.5)
WBC: 10.8 10*3/uL — ABNORMAL HIGH (ref 4.0–10.5)

## 2018-02-24 LAB — BASIC METABOLIC PANEL
Anion gap: 13 (ref 5–15)
BUN: 25 mg/dL — ABNORMAL HIGH (ref 6–20)
CO2: 22 mmol/L (ref 22–32)
Calcium: 8.9 mg/dL (ref 8.9–10.3)
Chloride: 102 mmol/L (ref 101–111)
Creatinine, Ser: 1.49 mg/dL — ABNORMAL HIGH (ref 0.61–1.24)
GFR calc Af Amer: 51 mL/min — ABNORMAL LOW (ref >60)
GFR calc non Af Amer: 44 mL/min — ABNORMAL LOW (ref >60)
Glucose, Bld: 74 mg/dL (ref 65–99)
Potassium: 4.2 mmol/L (ref 3.5–5.1)
Sodium: 137 mmol/L (ref 135–145)

## 2018-02-24 LAB — URINALYSIS, ROUTINE W REFLEX MICROSCOPIC
Bacteria, UA: NONE SEEN
Bilirubin Urine: NEGATIVE
Glucose, UA: NEGATIVE mg/dL
Ketones, ur: NEGATIVE mg/dL
Leukocytes, UA: NEGATIVE
Nitrite: NEGATIVE
Protein, ur: NEGATIVE mg/dL
Specific Gravity, Urine: 1.014 (ref 1.005–1.030)
pH: 6 (ref 5.0–8.0)

## 2018-02-24 LAB — BLOOD GAS, ARTERIAL
Acid-base deficit: 2.8 mmol/L — ABNORMAL HIGH (ref 0.0–2.0)
Bicarbonate: 22.4 mmol/L (ref 20.0–28.0)
Drawn by: 22223
FIO2: 100
O2 Content: 15 L/min
O2 Saturation: 98.9 %
pCO2 arterial: 32.9 mmHg (ref 32.0–48.0)
pH, Arterial: 7.42 (ref 7.350–7.450)
pO2, Arterial: 150 mmHg — ABNORMAL HIGH (ref 83.0–108.0)

## 2018-02-24 LAB — CBG MONITORING, ED
Glucose-Capillary: 46 mg/dL — ABNORMAL LOW (ref 65–99)
Glucose-Capillary: 73 mg/dL (ref 65–99)

## 2018-02-24 LAB — LACTIC ACID, PLASMA
Lactic Acid, Venous: 2.5 mmol/L (ref 0.5–1.9)
Lactic Acid, Venous: 2.8 mmol/L (ref 0.5–1.9)
Lactic Acid, Venous: 2.8 mmol/L (ref 0.5–1.9)
Lactic Acid, Venous: 2.5 mmol/L (ref 0.5–1.9)

## 2018-02-24 LAB — I-STAT CG4 LACTIC ACID, ED: Lactic Acid, Venous: 2.21 mmol/L (ref 0.5–1.9)

## 2018-02-24 LAB — INFLUENZA PANEL BY PCR (TYPE A & B)
Influenza A By PCR: NEGATIVE
Influenza B By PCR: NEGATIVE

## 2018-02-24 LAB — PROCALCITONIN: Procalcitonin: <0.1 ng/mL

## 2018-02-24 LAB — BRAIN NATRIURETIC PEPTIDE: B Natriuretic Peptide: 54 pg/mL (ref 0.0–100.0)

## 2018-02-24 MED ORDER — IOPAMIDOL (ISOVUE-300) INJECTION 61%: 100.0000 mL | Freq: Once | INTRAVENOUS | Status: DC | PRN

## 2018-02-24 MED ORDER — INSULIN ASPART PROT & ASPART (70-30 MIX) 100 UNIT/ML ~~LOC~~ SUSP: 40.0000 [IU] | Freq: Two times a day (BID) | SUBCUTANEOUS | Status: DC

## 2018-02-24 MED ORDER — SODIUM CHLORIDE 0.9 % IV SOLN
500.0000 mg | INTRAVENOUS | Status: DC
  Administered 2018-02-24 – 2018-03-01 (×6): 500 mg via INTRAVENOUS
  Filled 2018-02-24 (×5): qty 500

## 2018-02-24 MED ORDER — KETOROLAC TROMETHAMINE 0.5 % OP SOLN
1.0000 [drp] | Freq: Four times a day (QID) | OPHTHALMIC | Status: DC
  Administered 2018-02-24 – 2018-03-03 (×26): 1 [drp] via OPHTHALMIC
  Filled 2018-02-24: qty 3

## 2018-02-24 MED ORDER — ACETAMINOPHEN 650 MG RE SUPP: 650.0000 mg | Freq: Four times a day (QID) | RECTAL | Status: DC | PRN

## 2018-02-24 MED ORDER — METOPROLOL TARTRATE 25 MG PO TABS
25.0000 mg | ORAL_TABLET | Freq: Two times a day (BID) | ORAL | Status: DC
Start: 2018-02-24 — End: 2018-03-03
  Administered 2018-02-24 – 2018-03-03 (×14): 25 mg via ORAL
  Filled 2018-02-24 (×14): qty 1

## 2018-02-24 MED ORDER — LACTATED RINGERS IV BOLUS (SEPSIS)
200.0000 mL | Freq: Once | INTRAVENOUS | Status: AC
  Administered 2018-02-24: 200 mL via INTRAVENOUS

## 2018-02-24 MED ORDER — TAMSULOSIN HCL 0.4 MG PO CAPS
0.8000 mg | ORAL_CAPSULE | Freq: Every evening | ORAL | Status: DC
  Administered 2018-02-25 – 2018-03-02 (×6): 0.8 mg via ORAL
  Filled 2018-02-24 (×6): qty 2

## 2018-02-24 MED ORDER — ENOXAPARIN SODIUM 60 MG/0.6ML ~~LOC~~ SOLN
60.0000 mg | SUBCUTANEOUS | Status: DC
  Administered 2018-02-24 – 2018-03-02 (×7): 60 mg via SUBCUTANEOUS
  Filled 2018-02-24 (×7): qty 0.6

## 2018-02-24 MED ORDER — LACTATED RINGERS IV BOLUS
500.0000 mL | Freq: Once | INTRAVENOUS | Status: AC
  Administered 2018-02-24: 500 mL via INTRAVENOUS

## 2018-02-24 MED ORDER — LACTATED RINGERS IV BOLUS (SEPSIS)
500.0000 mL | Freq: Once | INTRAVENOUS | Status: AC
  Administered 2018-02-24: 500 mL via INTRAVENOUS

## 2018-02-24 MED ORDER — INSULIN ASPART 100 UNIT/ML ~~LOC~~ SOLN
0.0000 [IU] | Freq: Every day | SUBCUTANEOUS | Status: DC
  Administered 2018-02-27: 4 [IU] via SUBCUTANEOUS
  Administered 2018-02-28: 5 [IU] via SUBCUTANEOUS
  Administered 2018-03-01: 2 [IU] via SUBCUTANEOUS
  Administered 2018-03-03: 4 [IU] via SUBCUTANEOUS

## 2018-02-24 MED ORDER — ALBUTEROL SULFATE (2.5 MG/3ML) 0.083% IN NEBU
5.0000 mg | INHALATION_SOLUTION | Freq: Once | RESPIRATORY_TRACT | Status: AC
  Administered 2018-02-24: 5 mg via RESPIRATORY_TRACT
  Filled 2018-02-24: qty 6

## 2018-02-24 MED ORDER — AMLODIPINE BESYLATE 5 MG PO TABS
10.0000 mg | ORAL_TABLET | Freq: Every day | ORAL | Status: DC
  Administered 2018-02-24: 10 mg via ORAL
  Filled 2018-02-24 (×2): qty 2

## 2018-02-24 MED ORDER — IOPAMIDOL (ISOVUE-370) INJECTION 76%
75.0000 mL | Freq: Once | INTRAVENOUS | Status: AC | PRN
  Administered 2018-02-24: 75 mL via INTRAVENOUS

## 2018-02-24 MED ORDER — SENNOSIDES-DOCUSATE SODIUM 8.6-50 MG PO TABS
1.0000 | ORAL_TABLET | Freq: Every evening | ORAL | Status: DC | PRN
Start: 2018-02-24 — End: 2018-03-03

## 2018-02-24 MED ORDER — INSULIN ASPART PROT & ASPART (70-30 MIX) 100 UNIT/ML ~~LOC~~ SUSP
40.0000 [IU] | Freq: Every day | SUBCUTANEOUS | Status: DC
  Administered 2018-02-25: 40 [IU] via SUBCUTANEOUS
  Filled 2018-02-24: qty 10

## 2018-02-24 MED ORDER — LOSARTAN POTASSIUM 50 MG PO TABS
100.0000 mg | ORAL_TABLET | Freq: Every day | ORAL | Status: DC
  Administered 2018-02-25 – 2018-02-26 (×2): 100 mg via ORAL
  Filled 2018-02-24 (×2): qty 2

## 2018-02-24 MED ORDER — VANCOMYCIN HCL 10 G IV SOLR
2000.0000 mg | Freq: Once | INTRAVENOUS | Status: AC
  Administered 2018-02-24: 2000 mg via INTRAVENOUS
  Filled 2018-02-24: qty 2000

## 2018-02-24 MED ORDER — ACETAMINOPHEN 325 MG PO TABS
650.0000 mg | ORAL_TABLET | Freq: Four times a day (QID) | ORAL | Status: DC | PRN
  Administered 2018-02-25 – 2018-02-26 (×3): 650 mg via ORAL
  Filled 2018-02-24 (×3): qty 2

## 2018-02-24 MED ORDER — PIOGLITAZONE HCL 30 MG PO TABS
30.0000 mg | ORAL_TABLET | Freq: Every day | ORAL | Status: DC
  Administered 2018-02-25 – 2018-02-27 (×2): 30 mg via ORAL
  Filled 2018-02-24 (×5): qty 1

## 2018-02-24 MED ORDER — SODIUM CHLORIDE 0.9 % IV SOLN
INTRAVENOUS | Status: DC
Start: 2018-02-24 — End: 2018-02-25
  Administered 2018-02-24 (×2): via INTRAVENOUS

## 2018-02-24 MED ORDER — LACTATED RINGERS IV BOLUS (SEPSIS)
1000.0000 mL | Freq: Once | INTRAVENOUS | Status: AC
  Administered 2018-02-24: 1000 mL via INTRAVENOUS

## 2018-02-24 MED ORDER — ASPIRIN-DIPYRIDAMOLE ER 25-200 MG PO CP12
1.0000 | ORAL_CAPSULE | Freq: Two times a day (BID) | ORAL | Status: DC
  Administered 2018-02-24 – 2018-03-03 (×14): 1 via ORAL
  Filled 2018-02-24 (×23): qty 1

## 2018-02-24 MED ORDER — ATORVASTATIN CALCIUM 40 MG PO TABS
40.0000 mg | ORAL_TABLET | Freq: Every evening | ORAL | Status: DC
  Administered 2018-02-24 – 2018-03-02 (×7): 40 mg via ORAL
  Filled 2018-02-24 (×7): qty 1

## 2018-02-24 MED ORDER — PIPERACILLIN-TAZOBACTAM 3.375 G IVPB 30 MIN
3.3750 g | Freq: Once | INTRAVENOUS | Status: AC
  Administered 2018-02-24: 3.375 g via INTRAVENOUS
  Filled 2018-02-24: qty 50

## 2018-02-24 MED ORDER — INSULIN ASPART PROT & ASPART (70-30 MIX) 100 UNIT/ML ~~LOC~~ SUSP
50.0000 [IU] | Freq: Every day | SUBCUTANEOUS | Status: DC
  Administered 2018-02-25: 50 [IU] via SUBCUTANEOUS
  Filled 2018-02-24: qty 10

## 2018-02-24 MED ORDER — ATROPINE SULFATE 1 % OP SOLN
1.0000 [drp] | Freq: Every day | OPHTHALMIC | Status: DC
  Administered 2018-02-24 – 2018-03-03 (×8): 1 [drp] via OPHTHALMIC
  Filled 2018-02-24: qty 2

## 2018-02-24 MED ORDER — SODIUM CHLORIDE 0.9 % IV SOLN
INTRAVENOUS | Status: AC
  Filled 2018-02-24: qty 500

## 2018-02-24 MED ORDER — SPIRONOLACTONE 25 MG PO TABS
25.0000 mg | ORAL_TABLET | Freq: Every day | ORAL | Status: DC
  Administered 2018-02-25 – 2018-02-28 (×4): 25 mg via ORAL
  Filled 2018-02-24 (×4): qty 1

## 2018-02-24 MED ORDER — IPRATROPIUM-ALBUTEROL 0.5-2.5 (3) MG/3ML IN SOLN
3.0000 mL | Freq: Four times a day (QID) | RESPIRATORY_TRACT | Status: DC
  Administered 2018-02-24 – 2018-03-02 (×22): 3 mL via RESPIRATORY_TRACT
  Filled 2018-02-24 (×22): qty 3

## 2018-02-24 MED ORDER — POLYVINYL ALCOHOL 1.4 % OP SOLN: 1.0000 [drp] | Freq: Four times a day (QID) | OPHTHALMIC | Status: DC | PRN

## 2018-02-24 MED ORDER — SODIUM CHLORIDE 0.9 % IV BOLUS
500.0000 mL | Freq: Once | INTRAVENOUS | Status: AC
  Administered 2018-02-24: 500 mL via INTRAVENOUS

## 2018-02-24 MED ORDER — TIMOLOL MALEATE 0.5 % OP SOLN
1.0000 [drp] | Freq: Two times a day (BID) | OPHTHALMIC | Status: DC
  Administered 2018-02-25 – 2018-03-03 (×12): 1 [drp] via OPHTHALMIC
  Filled 2018-02-24: qty 5

## 2018-02-24 MED ORDER — POTASSIUM CHLORIDE CRYS ER 20 MEQ PO TBCR
20.0000 meq | EXTENDED_RELEASE_TABLET | Freq: Every day | ORAL | Status: DC
  Administered 2018-02-25 – 2018-03-03 (×7): 20 meq via ORAL
  Filled 2018-02-24 (×7): qty 1

## 2018-02-24 MED ORDER — INSULIN ASPART 100 UNIT/ML ~~LOC~~ SOLN
0.0000 [IU] | Freq: Three times a day (TID) | SUBCUTANEOUS | Status: DC
  Administered 2018-02-25: 3 [IU] via SUBCUTANEOUS
  Administered 2018-02-25: 2 [IU] via SUBCUTANEOUS
  Administered 2018-02-26 (×2): 3 [IU] via SUBCUTANEOUS
  Administered 2018-02-27: 5 [IU] via SUBCUTANEOUS
  Administered 2018-02-27: 3 [IU] via SUBCUTANEOUS
  Administered 2018-02-27: 8 [IU] via SUBCUTANEOUS
  Administered 2018-02-28 (×2): 15 [IU] via SUBCUTANEOUS
  Administered 2018-02-28 – 2018-03-01 (×3): 11 [IU] via SUBCUTANEOUS
  Administered 2018-03-01: 5 [IU] via SUBCUTANEOUS
  Administered 2018-03-02 (×2): 8 [IU] via SUBCUTANEOUS
  Administered 2018-03-02: 11 [IU] via SUBCUTANEOUS
  Administered 2018-03-03 (×2): 8 [IU] via SUBCUTANEOUS

## 2018-02-24 MED ORDER — IPRATROPIUM-ALBUTEROL 20-100 MCG/ACT IN AERS: 1.0000 | INHALATION_SPRAY | Freq: Four times a day (QID) | RESPIRATORY_TRACT | Status: DC

## 2018-02-24 MED ORDER — CLOTRIMAZOLE-BETAMETHASONE 1-0.05 % EX CREA
1.0000 "application " | TOPICAL_CREAM | Freq: Two times a day (BID) | CUTANEOUS | Status: DC | PRN
  Administered 2018-02-25: 1 via TOPICAL
  Filled 2018-02-24: qty 15

## 2018-02-24 MED ORDER — ACETAMINOPHEN 325 MG PO TABS
650.0000 mg | ORAL_TABLET | Freq: Once | ORAL | Status: AC
  Administered 2018-02-24: 650 mg via ORAL
  Filled 2018-02-24: qty 2

## 2018-02-24 NOTE — H&P (Signed)
History and Physical  [name] DGU:[id number] DOB: 06-Apr-1941 DOA: 02/24/2018  Referring physician: Dr Eulis Foster, ED physician PCP: Alycia Rossetti, MD  Outpatient Specialists:   Patient Coming From: home  Chief Complaint: Shortness of Breath  HPI: [name] is a 77 y.o. male with a history of morbid obesity, obstructive sleep apnea, diabetes, hypertension, coronary artery disease status post CABG.  Patient was recently hospitalized with complaints of worsening dyspnea from 02/20/2018 until 4/23.  He had a considerable work-up at that time, including an echocardiogram, blood cultures, troponins.  This was all unrevealing.  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   The patient states that his breathing was at baseline for the past 2 to 3 days but worsened acutely this morning.  Patient was unable to get out of bed and was short of breath even sitting at the edge of the bed.  His wife had to help him get dressed.  Patient is relatively homebound and is only able to walk approximately 10 to 15 feet before becoming short of breath.  This is been going on for several months now and has been worsening.  Patient denies wheezing.  He has a mild loose minimally productive cough.  No other palliating or provoking factors.  Denies orthopnea, swelling, weight gain.  Since being home, the patient has been on a low-sodium diet.   Emergency Department Course: CBC shows slightly elevated white count at 10.8, which is increased from 4.7 on day of discharge on 4/23.  Patient also has an elevated lactic acid, which is increased from 2.4-2.8.  BNP is normal at 54.  Urinalysis is normal.  Chest x-ray shows no acute infiltrate.  Patient had a CTA of the chest again, which rules out PE or acute pulmonary process.  There is a question of bilateral hazy lung densities  -possibly due to small airway disease or minimal edema.  There is some mediastinal and hilar adenopathy, possibly reactive.  Patient started on IV antibiotics for sepsis: Vancomycin and Zosyn.  Was also bolused with 30 mils per kilogram of IV fluids.  Currently, the patient is not having any respiratory distress and is resting quite comfortably on room air and satting in the mid 90s.  Patient did have a fever in the emergency department.  Review of Systems:  Home Pt denies any fevers, chills, nausea, vomiting, diarrhea, constipation, abdominal pain, shortness of breath, dyspnea on exertion, orthopnea, cough, wheezing, palpitations, headache, vision changes, lightheadedness, dizziness, melena, rectal bleeding.  Review of systems are otherwise negative  Past Medical History:  Diagnosis Date  . C. difficile diarrhea   . 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 Coronary artery disease    Status post CABG in 2007 [facility] [place])  . 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 GERD (gastroesophageal reflux disease)   . Glaucoma   . Hypertension   . Peripheral vascular disease ([place])   . Sleep apnea   . Stroke [facility]) 2008, 2014, 2015   Past Surgical History:  Procedure Laterality Date  . CHOLECYSTECTOMY    . CORONARY ARTERY BYPASS GRAFT    . NO PAST SURGERIES    . THYROID SURGERY     Social History:  reports that he has quit smoking. He has never used  smokeless tobacco. He reports that he does not drink alcohol or use drugs. Patient lives at home  No Known Allergies  Family History  Problem Relation Age of Onset  . Diabetes Mother   . Heart failure Mother   . Hypertension Mother   . Diabetes Father   . Heart failure Father   . Hypertension Father   . Diabetes Sister   . Heart failure Sister   . Hypertension Sister   . Hyperlipidemia Sister   . Diabetes Brother   . Heart failure Brother   . Hypertension Brother        Prior to Admission medications   Medication Sig Start Date End Date Taking? Authorizing Provider  acetaminophen (TYLENOL) 650 MG CR tablet Take 1,300 mg by mouth every 8 (eight) hours as needed for pain.   Yes [provider]  amLODipine (NORVASC) 10 MG tablet Take 10 mg by mouth daily.   Yes [provider]  atorvastatin (LIPITOR) 80 MG tablet Take 40 mg by mouth every evening.    Yes [provider]  atropine 1 % ophthalmic solution Place 1 drop into the left eye daily.  03/28/17  Yes [provider]  Brinzolamide-Brimonidine (SIMBRINZA) 1-0.2 % SUSP Apply 1 drop to eye 2 (two) times daily.   Yes [provider]  carboxymethylcellulose (REFRESH PLUS) 0.5 % SOLN Place 1 drop into both eyes 4 (four) times daily as needed.   Yes [provider]  dipyridamole-aspirin (AGGRENOX) 200-25 MG per 12 hr capsule Take 1 capsule by mouth 2 (two) times daily.   Yes [provider]  folic acid (FOLVITE) 1 MG tablet Take 1 mg by mouth. 03/14/17  Yes [provider]  furosemide (LASIX) 20 MG tablet Take 1 tablet (20 mg total) by mouth daily. 02/19/18  Yes Hayden Rasmussen, MD  insulin aspart protamine- aspart (NOVOLOG MIX 70/30) (70-30) 100 UNIT/ML injection Inject into the skin. Patient takes 40 units in the morning and 50 units at night.   Yes [provider]  Ipratropium-Albuterol (COMBIVENT RESPIMAT) 20-100 MCG/ACT AERS respimat Inhale 1 puff into the lungs every 6 (six) hours.   Yes [provider]  ketorolac (ACULAR) 0.5 % ophthalmic solution Place 1 drop into the right eye 4 (four) times daily.  03/10/17  Yes [provider]  losartan (COZAAR) 100 MG tablet Take 100 mg by mouth daily.     Yes [provider]  metFORMIN (GLUCOPHAGE) 1000 MG tablet Take 1,000 mg by mouth 2 (two) times daily with a meal.     Yes [provider]  metoprolol (LOPRESSOR) 50 MG tablet Take 0.5 tablets (25 mg  total) by mouth 2 (two) times daily. FOR HEART/BLOOD PRESSURE. HOLD FOR SYSTOLIC BLOOD PRESSURE LESS THAN 1OO/HEART RATE LESS THAN 60 05/20/11  Yes Annita Brod, MD  pioglitazone (ACTOS) 30 MG tablet Take 30 mg by mouth daily.    Yes [provider]  potassium chloride (K-DUR,KLOR-CON) 10 MEQ tablet Take 2 tablets (20 mEq total) by mouth daily. Take with lasix 10/17/17  Yes [place], Modena Nunnery, MD  spironolactone (ALDACTONE) 25 MG tablet Take 25 mg by mouth daily.   Yes [provider]  tamsulosin (FLOMAX) 0.4 MG CAPS capsule Take 2 capsules (0.8 mg total) by mouth every evening. 06/09/16  Yes Miranda, Modena Nunnery, MD  timolol (BETIMOL) 0.5 % ophthalmic solution Place 1 drop into the left eye 2 (two) times daily.   Yes [provider]  Adalimumab 80 MG/0.8ML &  40MG /0.4ML PNKT Inject 1 Dose into the skin every 14 (fourteen) days. 09/21/17   [provider]  clotrimazole-betamethasone (LOTRISONE) cream Apply 1 application topically 2 (two) times daily. Patient taking differently: Apply 1 application topically as needed.  12/02/17   [place], Modena Nunnery, MD  methotrexate (RHEUMATREX) 2.5 MG tablet Take 20 mg by mouth once a week. 06/23/17   [provider]    Physical Exam: BP (!) 101/56 (BP Location: Left Arm)   Pulse 79   Temp 98.9 F (37.2 C) (Oral)   Resp (!) 26   Ht 5\' 8"  (1.727 m)   Wt 132 kg (291 lb)   SpO2 99%   BMI 44.25 kg/m   . General: Elderly male. Awake and alert and oriented x3. No acute cardiopulmonary distress.  [name] HEENT: Normocephalic atraumatic.  Right and left ears normal in appearance.  Pupils equal, round, reactive to light. Extraocular muscles are intact. Sclerae anicteric and noninjected.  Moist mucosal membranes. No mucosal lesions.  . Neck: Neck supple without lymphadenopathy. No carotid bruits. No masses palpated.  . Cardiovascular: Regular rate with normal S1-S2 sounds. No murmurs, rubs, gallops auscultated. No JVD, the body  habitus prohibits full exam.  . Respiratory: Good respiratory effort with no wheezes, rales, rhonchi. Lungs clear to auscultation bilaterally. No accessory muscle use. . Abdomen: Morbidly obese.  Soft, nontender, nondistended. Active bowel sounds. No masses or hepatosplenomegaly, but body habitus prohibits full exam. . Skin: No rashes, lesions, or ulcerations.  Dry, warm to touch. 2+ dorsalis pedis and radial pulses. . Musculoskeletal: No calf or leg pain. All major joints not erythematous nontender.  No upper or lower joint deformation.  Good ROM.  No contractures  . Psychiatric: Intact judgment and insight. Pleasant and cooperative. . Neurologic: No focal neurological deficits. Strength is 5/5 and symmetric in upper and lower extremities.  Cranial nerves II through XII are grossly intact.           Labs on Admission: I have personally reviewed following labs and imaging studies  CBC: Recent Labs  Lab 02/19/18 0855 02/20/18 0757 02/21/18 0338 02/24/18 1103  WBC 4.5 4.9 4.7 10.8*  NEUTROABS 3.4 3.6  --  7.2  HGB 9.6* 10.3* 10.3* 9.6*  HCT 30.1* 32.2* 32.9* 30.3*  MCV 99.7 98.8 99.1 98.1  PLT 262 314 372 854   Basic Metabolic Panel: Recent Labs  Lab 02/19/18 0855 02/20/18 0757 02/21/18 0338 02/24/18 1103 02/24/18 1451  NA 139 135 139 137 137  K 4.3 3.9 4.1 4.2 3.9  CL 105 101 104 102 101  CO2 22 22 23 22 22   GLUCOSE 196* 281* 127* 74 51*  BUN 19 15 13  25* 28*  CREATININE 1.26* 1.25* 1.27* 1.49* 1.67*  CALCIUM 8.5* 8.4* 8.4* 8.9 8.7*   GFR: Estimated Creatinine Clearance: 50.7 mL/min (A) (by C-G formula based on SCr of 1.67 mg/dL (H)). Liver Function Tests: Recent Labs  Lab 02/20/18 0757 02/24/18 1451  AST 20 25  ALT 19 22  ALKPHOS 51 52  BILITOT 1.1 0.7  PROT 7.1 7.1  ALBUMIN 3.1* 3.0*   No results for input(s): LIPASE, AMYLASE in the last 168 hours. No results for input(s): AMMONIA in the last 168 hours. Coagulation Profile: No results for input(s): INR,  PROTIME in the last 168 hours. Cardiac Enzymes: Recent Labs  Lab 02/19/18 0855 02/20/18 0757 02/20/18 1609 02/20/18 2218 02/21/18 0338  TROPONINI <0.03 <0.03 <0.03 <0.03 <0.03   BNP (last 3 results) No results for input(s): PROBNP  in the last 8760 hours. HbA1C: No results for input(s): HGBA1C in the last 72 hours. CBG: Recent Labs  Lab 02/21/18 0748 02/21/18 1130 02/21/18 1650 02/24/18 1231 02/24/18 1341  GLUCAP 150* 163* 170* 46* 73   Lipid Profile: No results for input(s): CHOL, HDL, LDLCALC, TRIG, CHOLHDL, LDLDIRECT in the last 72 hours. Thyroid Function Tests: No results for input(s): TSH, T4TOTAL, FREET4, T3FREE, THYROIDAB in the last 72 hours. Anemia Panel: No results for input(s): VITAMINB12, FOLATE, FERRITIN, TIBC, IRON, RETICCTPCT in the last 72 hours. Urine analysis:    Component Value Date/Time   COLORURINE YELLOW 02/24/2018 1047   APPEARANCEUR HAZY (A) 02/24/2018 1047   LABSPEC 1.014 02/24/2018 1047   PHURINE 6.0 02/24/2018 1047   GLUCOSEU NEGATIVE 02/24/2018 1047   HGBUR SMALL (A) 02/24/2018 1047   BILIRUBINUR NEGATIVE 02/24/2018 1047   KETONESUR NEGATIVE 02/24/2018 1047   PROTEINUR NEGATIVE 02/24/2018 1047   UROBILINOGEN 0.2 03/28/2014 0145   NITRITE NEGATIVE 02/24/2018 1047   LEUKOCYTESUR NEGATIVE 02/24/2018 1047   Sepsis Labs: @LABRCNTIP (procalcitonin:4,lacticidven:4) ) Recent Results (from the past 240 hour(s))  Blood Culture (routine x 2)     Status: None (Preliminary result)   Collection Time: 02/20/18  7:59 AM  Result Value Ref Range Status   Specimen Description BLOOD LEFT FOREARM  Final   Special Requests   Final    BOTTLES DRAWN AEROBIC AND ANAEROBIC Blood Culture adequate volume   Culture   Final    NO GROWTH 4 DAYS Performed at [facility], [address], [place], [place] [address]    Report Status PENDING  Incomplete  Blood Culture (routine x 2)     Status: None (Preliminary result)   Collection Time: 02/20/18  8:07 AM  Result  Value Ref Range Status   Specimen Description BLOOD RIGHT ARM  Final   Special Requests   Final    BOTTLES DRAWN AEROBIC AND ANAEROBIC Blood Culture adequate volume   Culture   Final    NO GROWTH 4 DAYS Performed at [facility], [address], [place], [place] [address]    Report Status PENDING  Incomplete  Culture, blood (routine x 2)     Status: None (Preliminary result)   Collection Time: 02/24/18 11:03 AM  Result Value Ref Range Status   Specimen Description BLOOD BLOOD LEFT HAND  Final   Special Requests   Final    BOTTLES DRAWN AEROBIC AND ANAEROBIC Blood Culture adequate volume Performed at [facility], [address] [place], [place] [address]    Culture PENDING  Incomplete   Report Status PENDING  Incomplete  Culture, blood (routine x 2)     Status: None (Preliminary result)   Collection Time: 02/24/18 11:03 AM  Result Value Ref Range Status   Specimen Description BLOOD BLOOD LEFT HAND  Final   Special Requests   Final    BOTTLES DRAWN AEROBIC AND ANAEROBIC Blood Culture adequate volume Performed at [facility], [address], [place], [place] [address]    Culture PENDING  Incomplete   Report Status PENDING  Incomplete     Radiological Exams on Admission: Dg Chest 2 View  Result Date: 02/24/2018 CLINICAL DATA:  Shortness of breath EXAM: CHEST - 2 VIEW COMPARISON:  February 19, 2018 chest radiograph and chest CT February 20, 2018 FINDINGS: There is no appreciable edema or consolidation. Heart is borderline enlarged with pulmonary vascular within normal limits. Patient is status post median sternotomy. There are surgical clips in the right upper thoracic region medially.  There is no adenopathy appreciable by radiography. Adenopathy seen on recent CT is not appreciable by radiography. There is anterior wedging of T12 vertebral body, stable. IMPRESSION: Cardiac prominence is stable. No frank edema or consolidation. Areas of postoperative change noted. Electronically Signed   By:  Lowella Grip III M.D.   On: 02/24/2018 10:04   Ct Angio Chest Pe W/cm &/or Wo Cm  Result Date: 02/24/2018 CLINICAL DATA:  Shortness of breath fever EXAM: CT ANGIOGRAPHY CHEST WITH CONTRAST TECHNIQUE: Multidetector CT imaging of the chest was performed using the standard protocol during bolus administration of intravenous contrast. Multiplanar CT image reconstructions and MIPs were obtained to evaluate the vascular anatomy. CONTRAST:  75 mL Isovue 370 intravenous COMPARISON:  Chest x-ray 02/24/2018, CT chest 02/20/2018 FINDINGS: Cardiovascular: Limited opacification of pulmonary arterial system. No gross acute central filling defects are seen. Nonaneurysmal aorta. Mild aortic atherosclerosis. Coronary vascular calcification. No pericardial effusion. Mediastinum/Nodes: Midline trachea. Coarse calcification in the left lobe of the thyroid. Multiple enlarged mediastinal lymph nodes. Right paratracheal lymph node measures 13 mm. Left perihilar lymph node measures 18 mm. Right hilar lymph node measures 18 mm. Subcarinal lymph node nodes measure up to 2 cm. Esophagus within normal limits. Post CABG changes. Lungs/Pleura: Scattered areas of mild hazy density. No consolidation or pleural effusion. Upper Abdomen: Cyst upper pole left kidney.  No acute abnormality Musculoskeletal: No chest wall abnormality. No acute or significant osseous findings. Sternotomy. Review of the MIP images confirms the above findings. IMPRESSION: 1. Suboptimal contrast opacification which limits evaluation for pulmonary embolus. No gross central embolus is seen. 2. No focal pulmonary consolidations. Mild bilateral hazy lung densities which could be due to small airways disease or minimal edema. 3. Mediastinal and hilar adenopathy, possibly reactive but consider short interval CT follow-up in 3-6 months to exclude lymphoproliferative abnormality. Aortic Atherosclerosis (ICD10-I70.0). Electronically Signed   By: Donavan Foil M.D.   On:  02/24/2018 14:12    EKG: Independently reviewed. Sinus rhythm, Left axis deviation, age indeterminate anterior infarct  Assessment/Plan: Principal Problem:   Sepsis ([place]) Active Problems:   HTN (hypertension)   DM (diabetes mellitus) type II controlled with renal manifestation ([facility])   CAD (coronary artery disease)   Obesity, Class III, BMI 40-49.9 (morbid obesity) ([place])   History of stroke   OSA (obstructive sleep apnea)   Dyspnea   Lactic acidosis   Acute-on-chronic kidney injury ([place])    This patient was discussed with the ED physician, including pertinent vitals, physical exam findings, labs, and imaging.  We also discussed care given by the ED provider.  1. Sepsis a. Admit b. Blood cultures obtained c. Vanco, Zosyn.  Will add azithromycin due to possible lung pathology d. Urine culture pending e. We will obtain procalcitonin f. Respiratory virus panel g. Repeat CBC in the morning 2. Lactic acidosis a. Lactic acid increased after 2 hours b. We will fluid hydrate and repeat lactic acid at 6 PM  3. Dyspnea - acute on chronic a. uncertain etiology: Question restrictive lung disease/Obesity hypoventilatory syndrome.  Additionally, in reviewing the patient's echocardiogram on 4/22 there is some question of whether the patient was in atrial flutter at that time. b. At this time, patient appears to be more compensated than when he first arrived and earlier in the day. c. We will continue the patient on telemetry monitoring d. Certainly the patient needs an outpatient pulmonary function test e. This continues to be a problem here, would consider inpatient pulmonology consult 4. Acute on  chronic kidney injury a. Fluid hydration b.  repeat BMP tomorrow 5. Diabetes a. Hold metformin due to IV contrast b. Sliding scale insulin c. We will continue other oral hypoglycemics 6. Hypertension a. Continue antihypertensives 7. CAD a. Continue Aggrenox 8. History of  stroke a. Continue Aggrenox 9. Obesity 10. Obstructive sleep apnea a. CPAP b. Respiratory therapy consult  DVT prophylaxis: Lovenox Consultants: No physician consults Code Status: Full code Family Communication: Wife in the room during exam and history Disposition Plan: Patient should be able to return home following admission   Mirriam Vadala Moores [facility] Pager [phone]  If 7PM-7AM, please contact night-coverage [email or web address] Password [email or web address]

## 2018-02-24 NOTE — ED Notes (Signed)
Pt called out stating he feels like blood sugar is dropping. CBG 46. EDP notified. Pt given orange juice and crackers with peanut butter.

## 2018-02-24 NOTE — ED Provider Notes (Addendum)
[facility] EMERGENCY DEPARTMENT Provider Note   CSN: [id number] Arrival date & time: 02/24/18  4562     History   Chief Complaint Chief Complaint  Patient presents with  . Shortness of Breath    HPI [name] is a 77 y.o. male.  Who presents for evaluation of shortness of breath with occasional cough, which started today.  He took his usual medication this morning including his inhaler.  He was discharged from the hospital, 3 days ago after admission for trouble breathing.  He was felt to have multifactorial dyspnea, and discharged on his usual medication.  He has not followed up with his PCP, yet.  He did not eat breakfast this morning.  He was able to eat well yesterday.  He did not know that he had a fever.  There is been no nausea, vomiting, focal weakness or paresthesia.  He denies chest pain or back pain.  There are no other known modifying factors.  HPI  Past Medical History:  Diagnosis Date  . C. difficile diarrhea   . 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 Coronary artery disease    Status post CABG in 2007 [facility] [place])  . 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 GERD (gastroesophageal reflux disease)   . Glaucoma   . Hypertension   . Peripheral vascular disease ([place])   . Sleep apnea   . Stroke [facility]) 2008, 2014, 2015    Patient Active Problem List   Diagnosis Date Noted  . Dyspnea 02/20/2018  . BPH (benign prostatic hyperplasia) 02/20/2018  . DDD (degenerative disc disease), lumbar 10/17/2017  . Chronic diastolic heart failure ([place]) 10/20/2015  . Peripheral edema 01/22/2015  . Tinea pedis 10/21/2014  . Prolonged Q-T interval on ECG 04/02/2014  . 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 Chronic kidney disease 11/21/2013  . Left leg weakness 09/06/2013  . CVA (cerebral infarction) 09/01/2013  . Left-sided weakness 08/22/2013  . OSA  (obstructive sleep apnea) 04/12/2013  . DM (diabetes mellitus) type II controlled with renal manifestation ([place]) 11/28/2012  . PVD (peripheral vascular disease) ([place]) 11/28/2012  . CAD (coronary artery disease) 11/28/2012  . Hyperlipidemia 11/28/2012  . Obesity, Class III, BMI 40-49.9 (morbid obesity) ([place]) 11/28/2012  . History of stroke 11/28/2012  . OAB (overactive bladder) 11/28/2012  . HTN (hypertension) 05/20/2011    Past Surgical History:  Procedure Laterality Date  . CHOLECYSTECTOMY    . CORONARY ARTERY BYPASS GRAFT    . NO PAST SURGERIES    . THYROID SURGERY          Home Medications    Prior to Admission medications   Medication Sig Start Date End Date Taking? Authorizing Provider  acetaminophen (TYLENOL) 650 MG CR tablet Take 1,300 mg by mouth every 8 (eight) hours as needed for pain.   Yes [provider]  amLODipine (NORVASC) 10 MG tablet Take 10 mg by mouth daily.   Yes [provider]  atorvastatin (LIPITOR) 80 MG tablet Take 40 mg by mouth every evening.    Yes [provider]  atropine 1 % ophthalmic solution Place 1 drop into the left eye daily.  03/28/17  Yes [provider]  Brinzolamide-Brimonidine (SIMBRINZA) 1-0.2 % SUSP Apply 1 drop to eye 2 (two) times daily.   Yes [provider]  carboxymethylcellulose (REFRESH PLUS) 0.5 % SOLN Place 1 drop into both  eyes 4 (four) times daily as needed.   Yes [provider]  dipyridamole-aspirin (AGGRENOX) 200-25 MG per 12 hr capsule Take 1 capsule by mouth 2 (two) times daily.   Yes [provider]  folic acid (FOLVITE) 1 MG tablet Take 1 mg by mouth. 03/14/17  Yes [provider]  furosemide (LASIX) 20 MG tablet Take 1 tablet (20 mg total) by mouth daily. 02/19/18  Yes Hayden Rasmussen, MD  insulin aspart protamine- aspart (NOVOLOG MIX 70/30) (70-30) 100 UNIT/ML injection Inject into the skin. Patient takes 40 units in the morning and 50 units at  night.   Yes [provider]  Ipratropium-Albuterol (COMBIVENT RESPIMAT) 20-100 MCG/ACT AERS respimat Inhale 1 puff into the lungs every 6 (six) hours.   Yes [provider]  ketorolac (ACULAR) 0.5 % ophthalmic solution Place 1 drop into the right eye 4 (four) times daily.  03/10/17  Yes [provider]  losartan (COZAAR) 100 MG tablet Take 100 mg by mouth daily.     Yes [provider]  metFORMIN (GLUCOPHAGE) 1000 MG tablet Take 1,000 mg by mouth 2 (two) times daily with a meal.     Yes [provider]  metoprolol (LOPRESSOR) 50 MG tablet Take 0.5 tablets (25 mg total) by mouth 2 (two) times daily. FOR HEART/BLOOD PRESSURE. HOLD FOR SYSTOLIC BLOOD PRESSURE LESS THAN 1OO/HEART RATE LESS THAN 60 05/20/11  Yes Annita Brod, MD  pioglitazone (ACTOS) 30 MG tablet Take 30 mg by mouth daily.    Yes [provider]  potassium chloride (K-DUR,KLOR-CON) 10 MEQ tablet Take 2 tablets (20 mEq total) by mouth daily. Take with lasix 10/17/17  Yes [place], Modena Nunnery, MD  spironolactone (ALDACTONE) 25 MG tablet Take 25 mg by mouth daily.   Yes [provider]  tamsulosin (FLOMAX) 0.4 MG CAPS capsule Take 2 capsules (0.8 mg total) by mouth every evening. 06/09/16  Yes [place], Modena Nunnery, MD  timolol (BETIMOL) 0.5 % ophthalmic solution Place 1 drop into the left eye 2 (two) times daily.   Yes [provider]  Adalimumab 80 MG/0.8ML & 40MG /0.4ML PNKT Inject 1 Dose into the skin every 14 (fourteen) days. 09/21/17   [provider]  clotrimazole-betamethasone (LOTRISONE) cream Apply 1 application topically 2 (two) times daily. Patient taking differently: Apply 1 application topically as needed.  12/02/17   [place], Modena Nunnery, MD  methotrexate (RHEUMATREX) 2.5 MG tablet Take 20 mg by mouth once a week. 06/23/17   [provider]    Family History Family History  Problem Relation Age of Onset  . Diabetes Mother   . Heart failure  Mother   . Hypertension Mother   . Diabetes Father   . Heart failure Father   . Hypertension Father   . Diabetes Sister   . Heart failure Sister   . Hypertension Sister   . Hyperlipidemia Sister   . Diabetes Brother   . Heart failure Brother   . Hypertension Brother     Social History Social History   Tobacco Use  . Smoking status: Former Research scientist (life sciences)  . Smokeless tobacco: Never Used  Substance Use Topics  . Alcohol use: No  . Drug use: No     Allergies   Patient has no known allergies.   Review of Systems Review of Systems  All other systems reviewed and are negative.    Physical Exam Updated Vital Signs BP (!) 101/56 (BP Location: Left Arm)   Pulse 79   Temp  98.9 F (37.2 C) (Oral)   Resp (!) 26   Ht 5\' 8"  (1.727 m)   Wt 132 kg (291 lb)   SpO2 99%   BMI 44.25 kg/m   Physical Exam  Constitutional: He is oriented to person, place, and time. He appears well-developed.  Obese  HENT:  Head: Normocephalic and atraumatic.  Right Ear: External ear normal.  Left Ear: External ear normal.  Eyes: Pupils are equal, round, and reactive to light. Conjunctivae and EOM are normal.  Neck: Normal range of motion and phonation normal. Neck supple.  Cardiovascular: Normal rate, regular rhythm and normal heart sounds.  Pulmonary/Chest: Effort normal. No accessory muscle usage. Tachypnea noted. No respiratory distress. He exhibits no bony tenderness.  Decreased air movement bilaterally.  No wheezes rales or rhonchi.  Abdominal: Soft. He exhibits no distension. There is no tenderness. There is no guarding.  Musculoskeletal: Normal range of motion. He exhibits edema (1-2+ lower legs bilaterally.). He exhibits no tenderness.  Neurological: He is alert and oriented to person, place, and time. No cranial nerve deficit or sensory deficit. He exhibits normal muscle tone. Coordination normal.  Skin: Skin is warm, dry and intact.  Psychiatric: He has a normal mood and affect. His  behavior is normal. Judgment and thought content normal.  Nursing note and vitals reviewed.    ED Treatments / Results  Labs (all labs ordered are listed, but only abnormal results are displayed) Labs Reviewed  BASIC METABOLIC PANEL - Abnormal; Notable for the following components:      Result Value   BUN 25 (*)    Creatinine, Ser 1.49 (*)    GFR calc non Af Amer 44 (*)    GFR calc Af Amer 51 (*)    All other components within normal limits  CBC WITH DIFFERENTIAL/PLATELET - Abnormal; Notable for the following components:   WBC 10.8 (*)    RBC 3.09 (*)    Hemoglobin 9.6 (*)    HCT 30.3 (*)    All other components within normal limits  URINALYSIS, ROUTINE W REFLEX MICROSCOPIC - Abnormal; Notable for the following components:   APPearance HAZY (*)    Hgb urine dipstick SMALL (*)    All other components within normal limits  LACTIC ACID, PLASMA - Abnormal; Notable for the following components:   Lactic Acid, Venous 2.5 (*)    All other components within normal limits  I-STAT CG4 LACTIC ACID, ED - Abnormal; Notable for the following components:   Lactic Acid, Venous 2.21 (*)    All other components within normal limits  CBG MONITORING, ED - Abnormal; Notable for the following components:   Glucose-Capillary 46 (*)    All other components within normal limits  CULTURE, BLOOD (ROUTINE X 2)  CULTURE, BLOOD (ROUTINE X 2)  URINE CULTURE  BRAIN NATRIURETIC PEPTIDE  INFLUENZA PANEL BY PCR (TYPE A & B)  LACTIC ACID, PLASMA  COMPREHENSIVE METABOLIC PANEL  CBG MONITORING, ED    EKG EKG Interpretation  Date/Time:  Friday February 24 2018 09:34:52 EDT Ventricular Rate:  84 PR Interval:  196 QRS Duration: 88 QT Interval:  348 QTC Calculation: 411 R Axis:   -66 Text Interpretation:  Normal sinus rhythm Left axis deviation Possible Anterior infarct , age undetermined Abnormal ECG since last tracing no significant change Confirmed by Daleen Bo [phone]) on 02/24/2018 10:17:24  AM   Radiology Dg Chest 2 View  Result Date: 02/24/2018 CLINICAL DATA:  Shortness of breath EXAM: CHEST - 2 VIEW COMPARISON:  February 19, 2018 chest radiograph and chest CT February 20, 2018 FINDINGS: There is no appreciable edema or consolidation. Heart is borderline enlarged with pulmonary vascular within normal limits. Patient is status post median sternotomy. There are surgical clips in the right upper thoracic region medially. There is no adenopathy appreciable by radiography. Adenopathy seen on recent CT is not appreciable by radiography. There is anterior wedging of T12 vertebral body, stable. IMPRESSION: Cardiac prominence is stable. No frank edema or consolidation. Areas of postoperative change noted. Electronically Signed   By: Lowella Grip III M.D.   On: 02/24/2018 10:04   Ct Angio Chest Pe W/cm &/or Wo Cm  Result Date: 02/24/2018 CLINICAL DATA:  Shortness of breath fever EXAM: CT ANGIOGRAPHY CHEST WITH CONTRAST TECHNIQUE: Multidetector CT imaging of the chest was performed using the standard protocol during bolus administration of intravenous contrast. Multiplanar CT image reconstructions and MIPs were obtained to evaluate the vascular anatomy. CONTRAST:  75 mL Isovue 370 intravenous COMPARISON:  Chest x-ray 02/24/2018, CT chest 02/20/2018 FINDINGS: Cardiovascular: Limited opacification of pulmonary arterial system. No gross acute central filling defects are seen. Nonaneurysmal aorta. Mild aortic atherosclerosis. Coronary vascular calcification. No pericardial effusion. Mediastinum/Nodes: Midline trachea. Coarse calcification in the left lobe of the thyroid. Multiple enlarged mediastinal lymph nodes. Right paratracheal lymph node measures 13 mm. Left perihilar lymph node measures 18 mm. Right hilar lymph node measures 18 mm. Subcarinal lymph node nodes measure up to 2 cm. Esophagus within normal limits. Post CABG changes. Lungs/Pleura: Scattered areas of mild hazy density. No consolidation or  pleural effusion. Upper Abdomen: Cyst upper pole left kidney.  No acute abnormality Musculoskeletal: No chest wall abnormality. No acute or significant osseous findings. Sternotomy. Review of the MIP images confirms the above findings. IMPRESSION: 1. Suboptimal contrast opacification which limits evaluation for pulmonary embolus. No gross central embolus is seen. 2. No focal pulmonary consolidations. Mild bilateral hazy lung densities which could be due to small airways disease or minimal edema. 3. Mediastinal and hilar adenopathy, possibly reactive but consider short interval CT follow-up in 3-6 months to exclude lymphoproliferative abnormality. Aortic Atherosclerosis (ICD10-I70.0). Electronically Signed   By: Donavan Foil M.D.   On: 02/24/2018 14:12    Procedures .Critical Care Performed by: Daleen Bo, MD Authorized by: Daleen Bo, MD   Critical care provider statement:    Critical care time (minutes):  35   Critical care start time:  02/24/2018 10:15 AM   Critical care end time:  02/24/2018 3:21 PM   Critical care time was exclusive of:  Separately billable procedures and treating other patients   Critical care was necessary to treat or prevent imminent or life-threatening deterioration of the following conditions:  Respiratory failure   Critical care was time spent personally by me on the following activities:  Blood draw for specimens, development of treatment plan with patient or surrogate, discussions with consultants, evaluation of patient's response to treatment, examination of patient, obtaining history from patient or surrogate, ordering and performing treatments and interventions, ordering and review of laboratory studies, pulse oximetry, re-evaluation of patient's condition, review of old charts and ordering and review of radiographic studies   (including critical care time)  Medications Ordered in ED Medications  0.9 %  sodium chloride infusion ( Intravenous New Bag/Given  02/24/18 1153)  iopamidol (ISOVUE-300) 61 % injection 100 mL ( Intravenous Canceled Entry 02/24/18 1352)  lactated ringers bolus 1,000 mL (has no administration in time range)    And  lactated ringers bolus 500  mL (has no administration in time range)    And  lactated ringers bolus 200 mL (has no administration in time range)  vancomycin (VANCOCIN) 2,000 mg in sodium chloride 0.9 % 500 mL IVPB (has no administration in time range)  piperacillin-tazobactam (ZOSYN) IVPB 3.375 g (has no administration in time range)  albuterol (PROVENTIL) (2.5 MG/3ML) 0.083% nebulizer solution 5 mg (5 mg Nebulization Given 02/24/18 1020)  acetaminophen (TYLENOL) tablet 650 mg (650 mg Oral Given 02/24/18 1031)  sodium chloride 0.9 % bolus 500 mL (0 mLs Intravenous Stopped 02/24/18 1322)  iopamidol (ISOVUE-370) 76 % injection 75 mL (75 mLs Intravenous Contrast Given 02/24/18 1352)     Initial Impression / Assessment and Plan / ED Course  I have reviewed the triage vital signs and the nursing notes.  Pertinent labs & imaging results that were available during my care of the patient were reviewed by me and considered in my medical decision making (see chart for details).  Clinical Course as of Feb 25 1520  Fri Feb 24, 2018  1025 NAD  DG Chest 2 View [EW]  2956 [name] now states that he has been too weak to stand up and walk on his own.  And that this is been going on for 1 week.   [EW]  1247 Low  CBG monitoring, ED(!) [EW]  1247 Elevated  I-Stat CG4 Lactic Acid, ED(!!) [EW]  1247 Normal except elevated BUN and creatinine  Basic metabolic panel(!) [EW]  2130 Normal  Brain natriuretic peptide [EW]  1247 Normal except white count 10.8, hemoglobin low 9.6  CBC with Differential(!) [EW]  1247 Normal  Influenza panel by PCR (type A & B) [EW]  1248 Urinalysis, Routine w reflex microscopic(!) [EW]  1248 Persistent tachypnea  Resp(!): 29 [EW]  1422 No evident pulmonary embolus, exam is somewhat limited.   No pulmonary infiltrate.  Nonspecific mediastinal and hilar adenopathy; follow-up evaluation in 3 to 6 months is recommended to exclude a lymphoproliferative abnormality.  CT Angio Chest PE W/Cm &/Or Wo Cm [EW]  1432 We will repeat lactate, somewhat elevated from initial, post treatment with IV fluids  Lactic acid, plasma(!!) [EW]  1511 Low, on room air  SpO2(!): 87 % [EW]    Clinical Course User Index [EW] Daleen Bo, MD     Patient Vitals for the past 24 hrs:  BP Temp Temp src Pulse Resp SpO2 Height Weight  02/24/18 1332 (!) 101/56 98.9 F (37.2 C) Oral 79 (!) 26 99 % - -  02/24/18 1330 (!) 101/56 - - 82 (!) 26 92 % - -  02/24/18 1230 (!) 131/54 - - 82 (!) 29 92 % - -  02/24/18 1200 138/68 - - 84 (!) 27 94 % - 132 kg (291 lb)  02/24/18 1130 (!) 121/51 - - 80 (!) 24 92 % - -  02/24/18 1120 (!) 119/50 - - 84 20 92 % - -  02/24/18 1030 (!) 153/52 - - 81 (!) 23 95 % - -  02/24/18 1000 (!) 138/46 - - 80 (!) 27 90 % - -  02/24/18 0929 - - - - - - 5\' 8"  (1.727 m) 125.6 kg (277 lb)  02/24/18 0928 (!) 149/55 (!) 101.3 F (38.5 C) Oral 89 20 (!) 87 % - -    2:41 PM Reevaluation with update and discussion. After initial assessment and treatment, an updated evaluation reveals at this time on room air patient's oxygen saturation 91%.  He still feels dyspneic at this  time.  Patient has not been able to see his PCP since hospital discharge, 3 days ago.  His appointments for next week.  He was instructed to get some exercise after being discharged, but today he felt so much worse secondary to breathing trouble so he came here.  Findings and plan discussed with patient and wife, all questions answered. [place] decision making-weakness with tachypnea, no clear evidence for pneumonia or congestive heart failure.  Patient's weight is about 2 kg greater than when discharged, and recently treated for multifactorial shortness of breath.  Persistent tachypnea required ordering repeat CT  angiogram to rule out pulmonary embolus.  Doubt ACS, congestive heart failure or pneumonia.  Recent cardiac echo with ejection fraction 55%, slightly; low-grade fever, evaluation negative for bacterial causes of dyspnea, and weakness.  Doubt UTI.  Fever improved with Tylenol.  Repeat lactate higher, following initial fluid bolus.  Sepsis dosing IV fluids ordered, empiric antibiotic started for infection, source unknown.  Incidental hypocalcemia, treated with oral nutrition, during ED evaluation.  She does not currently use oxygen at home, and has been hypoxic here as low as 87% on room air.   Nursing Notes Reviewed/ Care Coordinated Applicable Imaging Reviewed Interpretation of Laboratory Data incorporated into ED treatment   2:43 PM-Consult complete with hospitalist. Patient case explained and discussed. He agrees to admit patient for further evaluation and treatment. Call ended at 15:15 PM  Plan: Admit    Final Clinical Impressions(s) / ED Diagnoses   Final diagnoses:  Dyspnea, unspecified type  Sepsis, due to unspecified organism [facility])  Hypoglycemia    ED Discharge Orders    None       Daleen Bo, MD 02/24/18 1517    Daleen Bo, MD 02/24/18 1521

## 2018-02-24 NOTE — ED Notes (Signed)
CT notified pt was ready for transport.

## 2018-02-24 NOTE — ED Notes (Signed)
Pt transported to CT ?

## 2018-02-24 NOTE — ED Notes (Signed)
RT paged for breathing tx

## 2018-02-24 NOTE — ED Notes (Signed)
Date and time results received: 02/24/18 1352   Test: Lactic Acid Critical Value: 2.5  Name of Provider Notified: Dr. Eulis Foster

## 2018-02-24 NOTE — ED Triage Notes (Signed)
Patient complains of shortness of breath. Patient was discharged from [facility] for CHF exacerbation. Patient states he has been taking his diarrhetic medication. States increased cough in past day. Fever in triage 101.3.

## 2018-02-24 NOTE — ED Notes (Addendum)
Received call from CT stating IV was not working during scan. CT tech pulled IV. Will get another RN to attempt for scan. No redness or swelling noted to IV site.

## 2018-02-24 NOTE — ED Notes (Signed)
Phlebotomy at bedside.

## 2018-02-24 NOTE — ED Notes (Signed)
Critical Value  CG4  Result: 2.21  Provider: E. Eulis Foster

## 2018-02-25 DIAGNOSIS — E662 Morbid (severe) obesity with alveolar hypoventilation: Secondary | ICD-10-CM

## 2018-02-25 LAB — CULTURE, BLOOD (ROUTINE X 2)
Culture: NO GROWTH
Culture: NO GROWTH
Special Requests: ADEQUATE
Special Requests: ADEQUATE

## 2018-02-25 LAB — RESPIRATORY PANEL BY PCR

## 2018-02-25 LAB — BASIC METABOLIC PANEL
Anion gap: 13 (ref 5–15)
BUN: 19 mg/dL (ref 6–20)
CO2: 21 mmol/L — ABNORMAL LOW (ref 22–32)
Calcium: 8.1 mg/dL — ABNORMAL LOW (ref 8.9–10.3)
Chloride: 102 mmol/L (ref 101–111)
Creatinine, Ser: 1.4 mg/dL — ABNORMAL HIGH (ref 0.61–1.24)
GFR calc Af Amer: 55 mL/min — ABNORMAL LOW (ref >60)
GFR calc non Af Amer: 48 mL/min — ABNORMAL LOW (ref >60)
Glucose, Bld: 142 mg/dL — ABNORMAL HIGH (ref 65–99)
Potassium: 4.7 mmol/L (ref 3.5–5.1)
Sodium: 136 mmol/L (ref 135–145)

## 2018-02-25 LAB — CBC
HCT: 30.6 % — ABNORMAL LOW (ref 39.0–52.0)
Hemoglobin: 9.5 g/dL — ABNORMAL LOW (ref 13.0–17.0)
MCH: 30.8 pg (ref 26.0–34.0)
MCHC: 31 g/dL (ref 30.0–36.0)
MCV: 99.4 fL (ref 78.0–100.0)
Platelets: 349 10*3/uL (ref 150–400)
RBC: 3.08 MIL/uL — ABNORMAL LOW (ref 4.22–5.81)
RDW: 15.7 % — ABNORMAL HIGH (ref 11.5–15.5)
WBC: 11.4 10*3/uL — ABNORMAL HIGH (ref 4.0–10.5)

## 2018-02-25 LAB — GLUCOSE, CAPILLARY
Glucose-Capillary: 124 mg/dL — ABNORMAL HIGH (ref 65–99)
Glucose-Capillary: 159 mg/dL — ABNORMAL HIGH (ref 65–99)
Glucose-Capillary: 63 mg/dL — ABNORMAL LOW (ref 65–99)
Glucose-Capillary: 154 mg/dL — ABNORMAL HIGH (ref 65–99)
Glucose-Capillary: 63 mg/dL — ABNORMAL LOW (ref 65–99)

## 2018-02-25 LAB — PROCALCITONIN: Procalcitonin: <0.1 ng/mL

## 2018-02-25 LAB — TSH: TSH: 1.039 u[IU]/mL (ref 0.350–4.500)

## 2018-02-25 MED ORDER — PIPERACILLIN-TAZOBACTAM 3.375 G IVPB
3.3750 g | Freq: Three times a day (TID) | INTRAVENOUS | Status: DC
  Administered 2018-02-25 – 2018-02-28 (×10): 3.375 g via INTRAVENOUS
  Filled 2018-02-25 (×9): qty 50

## 2018-02-25 MED ORDER — LORAZEPAM 2 MG/ML IJ SOLN
0.5000 mg | Freq: Once | INTRAMUSCULAR | Status: AC
  Administered 2018-02-25: 0.5 mg via INTRAVENOUS
  Filled 2018-02-25: qty 1

## 2018-02-25 MED ORDER — LORAZEPAM 2 MG/ML IJ SOLN
0.5000 mg | Freq: Four times a day (QID) | INTRAMUSCULAR | Status: DC
  Administered 2018-02-25 – 2018-02-26 (×2): 0.5 mg via INTRAVENOUS
  Filled 2018-02-25 (×2): qty 1

## 2018-02-25 MED ORDER — VANCOMYCIN HCL 10 G IV SOLR
1500.0000 mg | INTRAVENOUS | Status: DC
  Administered 2018-02-25 – 2018-02-27 (×3): 1500 mg via INTRAVENOUS
  Filled 2018-02-25 (×4): qty 1500

## 2018-02-25 MED ORDER — FUROSEMIDE 10 MG/ML IJ SOLN
60.0000 mg | INTRAMUSCULAR | Status: AC
  Administered 2018-02-25: 60 mg via INTRAVENOUS
  Filled 2018-02-25: qty 6

## 2018-02-25 NOTE — Progress Notes (Addendum)
Pharmacy Antibiotic Note  [name] is a 77 y.o. male admitted on 02/24/2018 with sepsis.  Pharmacy has been consulted for Vancomycin/Zosyn dosing.  Plan: Vancomycin 2000 mg IV x 1 dose for loading dose Vancomycin 1500 mg IV every 24 hours.  Goal trough 15-20 mcg/mL. Zosyn 3.375g IV q8h (4 hour infusion).  Monitor labs, c/s, and vanco trough as indicated  Height: 5\' 8"  (172.7 cm) Weight: 281 lb 4.8 oz (127.6 kg) IBW/kg (Calculated) : 68.4  Temp (24hrs), Avg:99.6 F (37.6 C), Min:98.5 F (36.9 C), Max:101.3 F (38.5 C)  Recent Labs  Lab 02/19/18 0855 02/20/18 0757  02/21/18 0338 02/24/18 1103 02/24/18 1131 02/24/18 1255 02/24/18 1451 02/24/18 1759 02/24/18 2121  WBC 4.5 4.9  --  4.7 10.8*  --   --   --   --   --   CREATININE 1.26* 1.25*  --  1.27* 1.49*  --   --  1.67*  --   --   LATICACIDVEN  --   --    < >  --   --  2.21* 2.5* 2.8* 2.8* 2.5*   < > = values in this interval not displayed.    Estimated Creatinine Clearance: 49.8 mL/min (A) (by C-G formula based on SCr of 1.67 mg/dL (H)).    No Known Allergies  Antimicrobials this admission: Zosyn 4/26 >>  Vanco 4/26 >>  Azithromycin 4/26>>  Dose adjustments this admission: N/A  Microbiology results: 4/26 BCx: pending 4/26 UCx: pending   4/26 MRSA PCR: pending  Thank you for allowing pharmacy to be a part of this patient's care.  Margot Ables, PharmD Clinical Pharmacist 02/25/2018 8:55 AM

## 2018-02-25 NOTE — Plan of Care (Signed)
progressing 

## 2018-02-25 NOTE — Progress Notes (Signed)
PROGRESS NOTE                                                                                                                                                                                                             Patient Demographics:    [name], is a 78 y.o. male, DOB - [birth year], BHA:[id number]  Admit date - 02/24/2018   Admitting Physician Truett Mainland, DO  Outpatient Primary MD for the patient is [facility], Modena Nunnery, MD  LOS - 1  Outpatient Specialists:  Chief Complaint  Patient presents with  . Shortness of Breath       Brief Narrative 77 year old male with morbid obesity, OSA, diabetes mellitus, hypertension, CAD status post CABG recently hospitalized from 4/22-4/23 with worsening dyspnea with work-up done including echo, CT angiogram of the chest, blood cultures and troponin which were unremarkable.  Patient was discharged home with the impression that this was related to OHS with recommendations to follow-up with PCP and obtain outpatient PFT. Patient reports that he was fine for a few days but had acute worsening of his symptoms with shortness of breath on minimal exertion.  At baseline he only walks about 10-15 feet limited by dyspnea.  Denies any wheezing but had some productive cough.  Denied any fevers at home. In the ED was septic with fever of 101.3 F, hypoxic in the high 80s on room air and a WBC of 10.8 (was normal on recent discharge).  He also had elevated lactic acid of 2.8, normal BNP, UA and chest x-ray without any infiltrate.  He had a repeat CT angiogram of the chest which was negative for PE with questionable bilateral hazy lung densities and some mediastinal hilar adenopathy.  Patient placed on empiric vancomycin and Zosyn for sepsis and given fluid bolus in the ED. Patient admitted for further management of sepsis.   Subjective:   Patient was restless overnight stating difficulty  breathing.  He was placed on NRB and ABG done showed pH of 7.42, PO2 of 150, PCO2 of 32.9.  He still complained of difficulty breathing despite having stable sats and was placed on CPAP.  Assessment  & Plan :    Principal Problem:   Sepsis ([place]) Etiology unclear.  On empiric vancomycin and Zosyn.  Follow blood cultures.  Repeat lactic acid today.  Flu PCR and  respiratory viral panel negative.  UA and chest x-ray negative for infection.  Active Problems: Acute respiratory failure with hypoxia (HCC) On presentation.  I suspect this is associated with OHS.  Sats are stable on room air following admission.  No signs of infection, PE or volume overload on imaging.  TSH normal. Placed on NRB then CPAP overnight.  ABG unremarkable.  Repeat CT angiogram on admission negative for PE.  Discontinued IV fluids as patient had some leg edema and ordered a dose of IV Lasix 60 mg.  Given low-dose Ativan for anxiety. Currently on CPAP which I will continue.  Had extensive work-up during recent admission including echo and CT angiogram which were unremarkable. Continue CPAP at night. Patient will need outpatient PFT.  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  Renal function improving to baseline.  Avoid nephrotoxins.  Holding losartan.  Obesity hypoventilation syndrome As above.  Continue CPAP while in the hospital.  Will need PFT as outpatient.   Essential hypertension Stable.  Resume home medications . CAD with history of CABG Continue metoprolol, holding losartan for AKI.   DM (diabetes mellitus) type II controlled with renal manifestation (HCC) Holding metformin.  Monitor on sliding scale coverage.  Resume home dose NovoLog.     Obesity, Class III, BMI 40-49.9 (morbid obesity) (HCC)    History of stroke Continue statin.    Code Status : Full code  Family Communication  : None at bedside  Disposition Plan  : Home once improved  Barriers For Discharge : Active  symptoms  Consults  : None  Procedures  : CT angiogram of the chest  DVT Prophylaxis  :  Lovenox -   Lab Results  Component Value Date   PLT 349 02/25/2018    Antibiotics  :  Anti-infectives (From admission, onward)   Start     Dose/Rate Route Frequency Ordered Stop   02/25/18 1500  vancomycin (VANCOCIN) 1,500 mg in sodium chloride 0.9 % 500 mL IVPB     1,500 mg 250 mL/hr over 120 Minutes Intravenous Every 24 hours 02/25/18 0850     02/25/18 0930  piperacillin-tazobactam (ZOSYN) IVPB 3.375 g     3.375 g 12.5 mL/hr over 240 Minutes Intravenous Every 8 hours 02/25/18 0849     02/24/18 1630  azithromycin (ZITHROMAX) 500 mg in sodium chloride 0.9 % 250 mL IVPB     500 mg 250 mL/hr over 60 Minutes Intravenous Every 24 hours 02/24/18 1624     02/24/18 1500  vancomycin (VANCOCIN) 2,000 mg in sodium chloride 0.9 % 500 mL IVPB     2,000 mg 250 mL/hr over 120 Minutes Intravenous  Once 02/24/18 1443 02/24/18 1800   02/24/18 1445  piperacillin-tazobactam (ZOSYN) IVPB 3.375 g     3.375 g 100 mL/hr over 30 Minutes Intravenous  Once 02/24/18 1443 02/24/18 1630        Objective:   Vitals:   02/25/18 0544 02/25/18 0818 02/25/18 0834 02/25/18 1113  BP: (!) 145/65     Pulse: 84     Resp: (!) 28   (!) 30  Temp: 100.2 F (37.9 C)     TempSrc: Oral     SpO2: 100% 100% 96% 97%  Weight:      Height:        Wt Readings from Last 3 Encounters:  02/24/18 127.6 kg (281 lb 4.8 oz)  02/20/18 127.2 kg (280 lb 6.8 oz)  02/19/18 132.5 kg (292 lb)  Intake/Output Summary (Last 24 hours) at 02/25/2018 1344 Last data filed at 02/25/2018 0900 Gross per 24 hour  Intake 840 ml  Output 550 ml  Net 290 ml     Physical Exam  Gen: On CPAP, fatigu HEENT: CPAP, supple neck Chest: Diminished breath sounds due to body habitus bilaterally, no added sounds CVS: N S1&S2, no murmurs, rubs or gallop GI: soft, NT, ND, BS+ Musculoskeletal: warm, trace edema bilaterally     Data Review:     CBC Recent Labs  Lab 02/19/18 0855 02/20/18 0757 02/21/18 0338 02/24/18 1103 02/25/18 0824  WBC 4.5 4.9 4.7 10.8* 11.4*  HGB 9.6* 10.3* 10.3* 9.6* 9.5*  HCT 30.1* 32.2* 32.9* 30.3* 30.6*  PLT 262 314 372 342 349  MCV 99.7 98.8 99.1 98.1 99.4  MCH 31.8 31.6 31.0 31.1 30.8  MCHC 31.9 32.0 31.3 31.7 31.0  RDW 15.2 15.0 15.0 15.5 15.7*  LYMPHSABS 0.8 0.9  --  2.1  --   MONOABS 0.2 0.4  --  1.3  --   EOSABS 0.0 0.1  --  0.1  --   BASOSABS 0.0 0.0  --  0.1  --     Chemistries  Recent Labs  Lab 02/20/18 0757 02/21/18 0338 02/24/18 1103 02/24/18 1451 02/25/18 0824  NA 135 139 137 137 136  K 3.9 4.1 4.2 3.9 4.7  CL 101 104 102 101 102  CO2 22 23 22 22  21*  GLUCOSE 281* 127* 74 51* 142*  BUN 15 13 25* 28* 19  CREATININE 1.25* 1.27* 1.49* 1.67* 1.40*  CALCIUM 8.4* 8.4* 8.9 8.7* 8.1*  AST 20  --   --  25  --   ALT 19  --   --  22  --   ALKPHOS 51  --   --  52  --   BILITOT 1.1  --   --  0.7  --    ------------------------------------------------------------------------------------------------------------------ No results for input(s): CHOL, HDL, LDLCALC, TRIG, CHOLHDL, LDLDIRECT in the last 72 hours.  Lab Results  Component Value Date   HGBA1C 7.0 (H) 02/20/2018   ------------------------------------------------------------------------------------------------------------------ Recent Labs    02/25/18 0824  TSH 1.039   ------------------------------------------------------------------------------------------------------------------ No results for input(s): VITAMINB12, FOLATE, FERRITIN, TIBC, IRON, RETICCTPCT in the last 72 hours.  Coagulation profile No results for input(s): INR, PROTIME in the last 168 hours.  No results for input(s): DDIMER in the last 72 hours.  Cardiac Enzymes Recent Labs  Lab 02/20/18 1609 02/20/18 2218 02/21/18 0338  TROPONINI <0.03 <0.03 <0.03    ------------------------------------------------------------------------------------------------------------------    Component Value Date/Time   BNP 54.0 02/24/2018 1103   BNP 87 02/15/2018 1112    Inpatient Medications  Scheduled Meds: . atorvastatin  40 mg Oral QPM  . atropine  1 drop Left Eye Daily  . dipyridamole-aspirin  1 capsule Oral BID  . enoxaparin (LOVENOX) injection  60 mg Subcutaneous Q24H  . insulin aspart  0-15 Units Subcutaneous TID WC  . insulin aspart  0-5 Units Subcutaneous QHS  . insulin aspart protamine- aspart  40 Units Subcutaneous Q breakfast   And  . insulin aspart protamine- aspart  50 Units Subcutaneous Q supper  . ipratropium-albuterol  3 mL Nebulization Q6H  . ketorolac  1 drop Right Eye QID  . losartan  100 mg Oral Daily  . metoprolol tartrate  25 mg Oral BID  . pioglitazone  30 mg Oral Daily  . potassium chloride  20 mEq Oral Daily  . spironolactone  25 mg Oral  Daily  . tamsulosin  0.8 mg Oral QPM  . timolol  1 drop Left Eye BID   Continuous Infusions: . azithromycin Stopped (02/25/18 0246)  . piperacillin-tazobactam (ZOSYN)  IV 3.375 g (02/25/18 1124)  . vancomycin     PRN Meds:.acetaminophen **OR** acetaminophen, clotrimazole-betamethasone, polyvinyl alcohol, senna-docusate  Micro Results Recent Results (from the past 240 hour(s))  Blood Culture (routine x 2)     Status: None   Collection Time: 02/20/18  7:59 AM  Result Value Ref Range Status   Specimen Description BLOOD LEFT FOREARM  Final   Special Requests   Final    BOTTLES DRAWN AEROBIC AND ANAEROBIC Blood Culture adequate volume   Culture   Final    NO GROWTH 5 DAYS Performed at [facility], [address] [place], [place] [address]    Report Status 02/25/2018 FINAL  Final  Blood Culture (routine x 2)     Status: None   Collection Time: 02/20/18  8:07 AM  Result Value Ref Range Status   Specimen Description BLOOD RIGHT ARM  Final   Special Requests   Final    BOTTLES  DRAWN AEROBIC AND ANAEROBIC Blood Culture adequate volume   Culture   Final    NO GROWTH 5 DAYS Performed at [facility], [address] [place], [place] [address]    Report Status 02/25/2018 FINAL  Final  Culture, blood (routine x 2)     Status: None (Preliminary result)   Collection Time: 02/24/18 11:03 AM  Result Value Ref Range Status   Specimen Description BLOOD BLOOD LEFT HAND  Final   Special Requests   Final    BOTTLES DRAWN AEROBIC AND ANAEROBIC Blood Culture adequate volume   Culture   Final    NO GROWTH < 24 HOURS Performed at [facility], [address] [place], [place] [address]    Report Status PENDING  Incomplete  Culture, blood (routine x 2)     Status: None (Preliminary result)   Collection Time: 02/24/18 11:03 AM  Result Value Ref Range Status   Specimen Description BLOOD BLOOD LEFT HAND  Final   Special Requests   Final    BOTTLES DRAWN AEROBIC AND ANAEROBIC Blood Culture adequate volume   Culture   Final    NO GROWTH < 24 HOURS Performed at [facility], [address], [place], [place] [address]    Report Status PENDING  Incomplete  Respiratory Panel by PCR     Status: None   Collection Time: 02/24/18 10:00 PM  Result Value Ref Range Status   Adenovirus NOT DETECTED NOT DETECTED Final   Coronavirus 229E NOT DETECTED NOT DETECTED Final   Coronavirus HKU1 NOT DETECTED NOT DETECTED Final   Coronavirus NL63 NOT DETECTED NOT DETECTED Final   Coronavirus OC43 NOT DETECTED NOT DETECTED Final   Metapneumovirus NOT DETECTED NOT DETECTED Final   Rhinovirus / Enterovirus NOT DETECTED NOT DETECTED Final   Influenza A NOT DETECTED NOT DETECTED Final   Influenza B NOT DETECTED NOT DETECTED Final   Parainfluenza Virus 1 NOT DETECTED NOT DETECTED Final   Parainfluenza Virus 2 NOT DETECTED NOT DETECTED Final   Parainfluenza Virus 3 NOT DETECTED NOT DETECTED Final   Parainfluenza Virus 4 NOT DETECTED NOT DETECTED Final   Respiratory Syncytial Virus NOT DETECTED NOT  DETECTED Final   Bordetella pertussis NOT DETECTED NOT DETECTED Final   Chlamydophila pneumoniae NOT DETECTED NOT DETECTED Final   Mycoplasma pneumoniae NOT DETECTED NOT DETECTED Final  Comment: Performed at [facility], [place] [address], [place], [place] [address]    Radiology Reports Dg Chest 2 View  Result Date: 02/24/2018 CLINICAL DATA:  Shortness of breath EXAM: CHEST - 2 VIEW COMPARISON:  February 19, 2018 chest radiograph and chest CT February 20, 2018 FINDINGS: There is no appreciable edema or consolidation. Heart is borderline enlarged with pulmonary vascular within normal limits. Patient is status post median sternotomy. There are surgical clips in the right upper thoracic region medially. There is no adenopathy appreciable by radiography. Adenopathy seen on recent CT is not appreciable by radiography. There is anterior wedging of T12 vertebral body, stable. IMPRESSION: Cardiac prominence is stable. No frank edema or consolidation. Areas of postoperative change noted. Electronically Signed   By: Lowella Grip III M.D.   On: 02/24/2018 10:04   Dg Chest 2 View  Result Date: 02/19/2018 CLINICAL DATA:  SOB for several days, legs swelling EXAM: CHEST - 2 VIEW COMPARISON:  08/05/2017 FINDINGS: Status post median sternotomy. Heart is UPPER normal in size. There is mild perihilar vascular prominence. No overt edema. No focal consolidations or pleural effusions. There is wedge deformity of numerous LOWER thoracic and UPPER lumbar vertebral bodies, stable in appearance. IMPRESSION: 1. Cardiomegaly and mild edema. 2. 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 3.  Recommend DXA bone density exam if not previously performed. Electronically Signed   By: Nolon Nations M.D.   On: 02/19/2018 10:05   Ct Angio Chest Pe W/cm &/or Wo Cm  Result Date: 02/24/2018 CLINICAL DATA:  Shortness of breath fever EXAM: CT ANGIOGRAPHY CHEST WITH CONTRAST TECHNIQUE: Multidetector CT  imaging of the chest was performed using the standard protocol during bolus administration of intravenous contrast. Multiplanar CT image reconstructions and MIPs were obtained to evaluate the vascular anatomy. CONTRAST:  75 mL Isovue 370 intravenous COMPARISON:  Chest x-ray 02/24/2018, CT chest 02/20/2018 FINDINGS: Cardiovascular: Limited opacification of pulmonary arterial system. No gross acute central filling defects are seen. Nonaneurysmal aorta. Mild aortic atherosclerosis. Coronary vascular calcification. No pericardial effusion. Mediastinum/Nodes: Midline trachea. Coarse calcification in the left lobe of the thyroid. Multiple enlarged mediastinal lymph nodes. Right paratracheal lymph node measures 13 mm. Left perihilar lymph node measures 18 mm. Right hilar lymph node measures 18 mm. Subcarinal lymph node nodes measure up to 2 cm. Esophagus within normal limits. Post CABG changes. Lungs/Pleura: Scattered areas of mild hazy density. No consolidation or pleural effusion. Upper Abdomen: Cyst upper pole left kidney.  No acute abnormality Musculoskeletal: No chest wall abnormality. No acute or significant osseous findings. Sternotomy. Review of the MIP images confirms the above findings. IMPRESSION: 1. Suboptimal contrast opacification which limits evaluation for pulmonary embolus. No gross central embolus is seen. 2. No focal pulmonary consolidations. Mild bilateral hazy lung densities which could be due to small airways disease or minimal edema. 3. Mediastinal and hilar adenopathy, possibly reactive but consider short interval CT follow-up in 3-6 months to exclude lymphoproliferative abnormality. Aortic Atherosclerosis (ICD10-I70.0). Electronically Signed   By: Donavan Foil M.D.   On: 02/24/2018 14:12   Ct Angio Chest Pe W And/or Wo Contrast  Result Date: 02/20/2018 CLINICAL DATA:  Shortness of breath for 1 month. EXAM: CT ANGIOGRAPHY CHEST WITH CONTRAST TECHNIQUE: Multidetector CT imaging of the chest was  performed using the standard protocol during bolus administration of intravenous contrast. Multiplanar CT image reconstructions and MIPs were obtained to evaluate the vascular anatomy. CONTRAST:  163mL ISOVUE-370 IOPAMIDOL (ISOVUE-370) INJECTION 76% COMPARISON:  Abdominal CT 02/24/2017 FINDINGS: Cardiovascular: Satisfactory opacification of the pulmonary arteries to the segmental level. No evidence of pulmonary embolism. Normal heart size. No pericardial effusion. Extensive atherosclerotic calcification of the coronaries, status post CABG. No acute aortic finding. Mediastinum/Nodes: Symmetric gynecomastia. Right hemithyroidectomy. Subcentimeter nodule in the lower pole left thyroid. Right hilar adenopathy that is asymmetric. Sub-carinal adenopathy measuring 22 mm short axis. These were not seen on a 02/25/2007 abdominal CT which covered much of the lower mediastinum. Lungs/Pleura: There is no edema, consolidation, effusion, or pneumothorax. Mild fissure thickening and occasional interlobular septal thickening which is likely atelectatic. No definite edema. Upper Abdomen: Cholecystectomy. Left upper pole renal cystic density. Musculoskeletal: No acute or aggressive finding. Review of the MIP images confirms the above findings. IMPRESSION: 1. Negative for pulmonary embolism or other acute finding. 2. Right hilar and subcarinal adenopathy not seen on a 2008 abdominal CT. No explainable findings on today's scan. If there is no explainable chronic lung disease or CHF on clinical grounds a follow-up CT could be obtained in 3-6 months. 3. Aortic Atherosclerosis (ICD10-I70.0). Electronically Signed   By: Monte Fantasia M.D.   On: 02/20/2018 09:26    Time Spent in minutes  25   Dartanyan Deasis M.D on 02/25/2018 at 1:44 PM  Between 7am to 7pm - Pager - [phone]  After 7pm go to [email or web address] - password [facility]  [facility] -  Office  [phone]

## 2018-02-25 NOTE — Progress Notes (Signed)
CRITICAL   reported by lab of 2.8 lactic acid level. RN paged on call hospitalist with report. New order for bolus was initiated

## 2018-02-26 ENCOUNTER — Inpatient Hospital Stay (HOSPITAL_COMMUNITY): Payer: Non-veteran care

## 2018-02-26 DIAGNOSIS — R0603 Acute respiratory distress: Secondary | ICD-10-CM

## 2018-02-26 LAB — GLUCOSE, CAPILLARY
Glucose-Capillary: 44 mg/dL — CL (ref 65–99)
Glucose-Capillary: 144 mg/dL — ABNORMAL HIGH (ref 65–99)
Glucose-Capillary: 118 mg/dL — ABNORMAL HIGH (ref 65–99)
Glucose-Capillary: 196 mg/dL — ABNORMAL HIGH (ref 65–99)
Glucose-Capillary: 159 mg/dL — ABNORMAL HIGH (ref 65–99)
Glucose-Capillary: 173 mg/dL — ABNORMAL HIGH (ref 65–99)

## 2018-02-26 LAB — BLOOD CULTURE ID PANEL (REFLEXED)

## 2018-02-26 LAB — URINE CULTURE: Culture: NO GROWTH

## 2018-02-26 LAB — BASIC METABOLIC PANEL
Anion gap: 12 (ref 5–15)
BUN: 21 mg/dL — ABNORMAL HIGH (ref 6–20)
CO2: 24 mmol/L (ref 22–32)
Calcium: 8.2 mg/dL — ABNORMAL LOW (ref 8.9–10.3)
Chloride: 103 mmol/L (ref 101–111)
Creatinine, Ser: 1.66 mg/dL — ABNORMAL HIGH (ref 0.61–1.24)
GFR calc Af Amer: 45 mL/min — ABNORMAL LOW (ref >60)
GFR calc non Af Amer: 39 mL/min — ABNORMAL LOW (ref >60)
Glucose, Bld: 116 mg/dL — ABNORMAL HIGH (ref 65–99)
Potassium: 4.3 mmol/L (ref 3.5–5.1)
Sodium: 139 mmol/L (ref 135–145)

## 2018-02-26 LAB — LACTIC ACID, PLASMA
Lactic Acid, Venous: 1.2 mmol/L (ref 0.5–1.9)
Lactic Acid, Venous: 1.6 mmol/L (ref 0.5–1.9)

## 2018-02-26 LAB — MRSA PCR SCREENING: MRSA by PCR: NEGATIVE

## 2018-02-26 LAB — PROCALCITONIN: Procalcitonin: 0.13 ng/mL

## 2018-02-26 MED ORDER — DEXTROSE 50 % IV SOLN
INTRAVENOUS | Status: AC
  Administered 2018-02-26: 50 mL
  Filled 2018-02-26: qty 50

## 2018-02-26 MED ORDER — FUROSEMIDE 10 MG/ML IJ SOLN: 40.0000 mg | Freq: Once | INTRAMUSCULAR | Status: DC

## 2018-02-26 MED ORDER — GLUCAGON HCL RDNA (DIAGNOSTIC) 1 MG IJ SOLR
INTRAMUSCULAR | Status: AC
  Administered 2018-02-26: 04:00:00
  Filled 2018-02-26: qty 1

## 2018-02-26 NOTE — Progress Notes (Signed)
Orders received to transfer patient to [facility]-08 stepdown,report called,and given to [facility] RN,family at the bedside.Oxygen at 4 liters nasal canula. Staff accompanied patient to awaiting floor.

## 2018-02-26 NOTE — Progress Notes (Signed)
ABG was attempted again and not obtained. The patient tenses up making him a difficult stick, two RT's have attempted to obtain the sample.  The patient will be placed on BiPAP as WOB and RR have increased. Will attempt ABG once patient more relaxed.

## 2018-02-26 NOTE — Progress Notes (Signed)
Patient's anaerobic bottle was positive for Gram cocci. Dr Clementeen Graham notified. Will continue to monitor patient.

## 2018-02-26 NOTE — Progress Notes (Signed)
PROGRESS NOTE                                                                                                                                                                                                             Patient Demographics:    [name], is a 77 y.o. male, DOB - 04-Jun-1941, JHE:[id number]  Admit date - 02/24/2018   Admitting Physician Truett Mainland, DO  Outpatient Primary MD for the patient is [facility], [place] Nunnery, MD  LOS - 2  Outpatient Specialists:  Chief Complaint  Patient presents with  . Shortness of Breath       Brief Narrative 77 year old male with morbid obesity, OSA, diabetes mellitus, hypertension, CAD status post CABG recently hospitalized from 4/22-4/23 with worsening dyspnea with work-up done including echo, CT angiogram of the chest, blood cultures and troponin which were unremarkable.  Patient was discharged home with the impression that this was related to OHS with recommendations to follow-up with PCP and obtain outpatient PFT. Patient reports that he was fine for a few days but had acute worsening of his symptoms with shortness of breath on minimal exertion.  At baseline he only walks about 10-15 feet limited by dyspnea.  Denies any wheezing but had some productive cough.  Denied any fevers at home. In the ED was septic with fever of 101.3 F, hypoxic in the high 80s on room air and a WBC of 10.8 (was normal on recent discharge).  He also had elevated lactic acid of 2.8, normal BNP, UA and chest x-ray without any infiltrate.  He had a repeat CT angiogram of the chest which was negative for PE with questionable bilateral hazy lung densities and some mediastinal hilar adenopathy.  Patient placed on empiric vancomycin and Zosyn for sepsis and given fluid bolus in the ED. Patient admitted for further management of sepsis.   Subjective:   Patient has been on CPAP since overnight as he was having  increasing work of breathing on nasal cannula.  Sats have been stable even on nasal cannula throughout.  CBG was low in the 40s this morning.  Patient transferred to stepdown unit for close monitoring and he was somnolent as well.  Had fever of 102.59F this afternoon   Assessment  & Plan :    Principal Problem:   Sepsis ([place]) Source unclear.  1/2 blood culture growing GPC.  On empiric vancomycin and Zosyn.  Flu PCR and respiratory viral panel negative.  Urine chest x-ray negative for infection.  Still having fever spikes.  Will obtain CT of the abdomen and pelvis.contrast (renal function has worsened today) to rule out any intra-abdominal infection.  Check lactic acid.  Active Problems: Acute respiratory failure with hypoxia (HCC) Sats have been stable on nasal cannula but has increased work of breathing.  Has had significant work-up including unremarkable CT angiogram of the chest, 2D echo and ABG. Repeat ABG attempted multiple times today but unsuccessful.  We will try again later this evening or tomorrow morning. Patient has been on CPAP since last night and while attempting to wean him off again had increased work this afternoon of breathing.  He is now placed on BiPAP. Hold off on Ativan for anxiety.  Hold off on further Lasix due to his worsening renal function today.  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  Renal function slightly worsened this morning.  Holding ARB and avoid nephrotoxins.  Obesity hypoventilation syndrome As above.  Currently on BiPAP.  Patient is on CPAP at home.  Needs outpatient PFT.   Essential hypertension Stable.  Resume metoprolol once tolerated.  Holding losartan. [name] CAD with history of CABG Continue metoprolol, holding losartan for AKI.    DM (diabetes mellitus) type II controlled with renal manifestation hypoglycemia (HCC) Holding metformin.  Patient having episodes of hypoglycemia (CBG 44 this morning).  Currently n.p.o.   Monitor CBC every 4 hours.     Obesity, Class III, BMI 40-49.9 (morbid obesity) (HCC)    History of stroke Continue statin.    Code Status : Full code  Family Communication  : Wife and extended family members at bedside  Disposition Plan  : Home once improved  Barriers For Discharge : Active symptoms  Consults  : None  Procedures  : CT angiogram of the chest, CT abdomen  DVT Prophylaxis  :  Lovenox -   Lab Results  Component Value Date   PLT 349 02/25/2018    Antibiotics  :  Anti-infectives (From admission, onward)   Start     Dose/Rate Route Frequency Ordered Stop   02/25/18 1500  vancomycin (VANCOCIN) 1,500 mg in sodium chloride 0.9 % 500 mL IVPB     1,500 mg 250 mL/hr over 120 Minutes Intravenous Every 24 hours 02/25/18 0850     02/25/18 0930  piperacillin-tazobactam (ZOSYN) IVPB 3.375 g     3.375 g 12.5 mL/hr over 240 Minutes Intravenous Every 8 hours 02/25/18 0849     02/24/18 1630  azithromycin (ZITHROMAX) 500 mg in sodium chloride 0.9 % 250 mL IVPB     500 mg 250 mL/hr over 60 Minutes Intravenous Every 24 hours 02/24/18 1624     02/24/18 1500  vancomycin (VANCOCIN) 2,000 mg in sodium chloride 0.9 % 500 mL IVPB     2,000 mg 250 mL/hr over 120 Minutes Intravenous  Once 02/24/18 1443 02/24/18 1800   02/24/18 1445  piperacillin-tazobactam (ZOSYN) IVPB 3.375 g     3.375 g 100 mL/hr over 30 Minutes Intravenous  Once 02/24/18 1443 02/24/18 1630        Objective:   Vitals:   02/26/18 1315 02/26/18 1500 02/26/18 1516 02/26/18 1618  BP:  (!) 154/63    Pulse:  91  92  Resp:  (!) 26  (!) 28  Temp:    (!) 102.6 F (39.2 C)  TempSrc:    Axillary  SpO2: 100%  100%   Weight:      Height:        Wt Readings from Last 3 Encounters:  02/26/18 127.5 kg (281 lb 1.4 oz)  02/20/18 127.2 kg (280 lb 6.8 oz)  02/19/18 132.5 kg (292 lb)     Intake/Output Summary (Last 24 hours) at 02/26/2018 1625 Last data filed at 02/26/2018 1518 Gross per 24 hour  Intake 1615  ml  Output 150 ml  Net 1465 ml    Physical exam General: Morbidly obese male on BiPAP, fatigued, easily arousable HEENT: On BiPAP Chest: Diminished bilateral breath sounds due to body habitus CVS: Normal S1 and S2, no murmurs GI: Soft, nondistended, nontender Musculoskeletal: Warm, trace bilateral edema     Data Review:    CBC Recent Labs  Lab 02/20/18 0757 02/21/18 0338 02/24/18 1103 02/25/18 0824  WBC 4.9 4.7 10.8* 11.4*  HGB 10.3* 10.3* 9.6* 9.5*  HCT 32.2* 32.9* 30.3* 30.6*  PLT 314 372 342 349  MCV 98.8 99.1 98.1 99.4  MCH 31.6 31.0 31.1 30.8  MCHC 32.0 31.3 31.7 31.0  RDW 15.0 15.0 15.5 15.7*  LYMPHSABS 0.9  --  2.1  --   MONOABS 0.4  --  1.3  --   EOSABS 0.1  --  0.1  --   BASOSABS 0.0  --  0.1  --     Chemistries  Recent Labs  Lab 02/20/18 0757 02/21/18 0338 02/24/18 1103 02/24/18 1451 02/25/18 0824 02/26/18 0835  NA 135 139 137 137 136 139  K 3.9 4.1 4.2 3.9 4.7 4.3  CL 101 104 102 101 102 103  CO2 22 23 22 22  21* 24  GLUCOSE 281* 127* 74 51* 142* 116*  BUN 15 13 25* 28* 19 21*  CREATININE 1.25* 1.27* 1.49* 1.67* 1.40* 1.66*  CALCIUM 8.4* 8.4* 8.9 8.7* 8.1* 8.2*  AST 20  --   --  25  --   --   ALT 19  --   --  22  --   --   ALKPHOS 51  --   --  52  --   --   BILITOT 1.1  --   --  0.7  --   --    ------------------------------------------------------------------------------------------------------------------ No results for input(s): CHOL, HDL, LDLCALC, TRIG, CHOLHDL, LDLDIRECT in the last 72 hours.  Lab Results  Component Value Date   HGBA1C 7.0 (H) 02/20/2018   ------------------------------------------------------------------------------------------------------------------ Recent Labs    02/25/18 0824  TSH 1.039   ------------------------------------------------------------------------------------------------------------------ No results for input(s): VITAMINB12, FOLATE, FERRITIN, TIBC, IRON, RETICCTPCT in the last 72  hours.  Coagulation profile No results for input(s): INR, PROTIME in the last 168 hours.  No results for input(s): DDIMER in the last 72 hours.  Cardiac Enzymes Recent Labs  Lab 02/20/18 1609 02/20/18 2218 02/21/18 0338  TROPONINI <0.03 <0.03 <0.03   ------------------------------------------------------------------------------------------------------------------    Component Value Date/Time   BNP 54.0 02/24/2018 1103   BNP 87 02/15/2018 1112    Inpatient Medications  Scheduled Meds: . atorvastatin  40 mg Oral QPM  . atropine  1 drop Left Eye Daily  . dipyridamole-aspirin  1 capsule Oral BID  . enoxaparin (LOVENOX) injection  60 mg Subcutaneous Q24H  . insulin aspart  0-15 Units Subcutaneous TID WC  . insulin aspart  0-5 Units Subcutaneous QHS  . ipratropium-albuterol  3 mL Nebulization Q6H  . ketorolac  1 drop Right Eye QID  . losartan  100  mg Oral Daily  . metoprolol tartrate  25 mg Oral BID  . pioglitazone  30 mg Oral Daily  . potassium chloride  20 mEq Oral Daily  . spironolactone  25 mg Oral Daily  . tamsulosin  0.8 mg Oral QPM  . timolol  1 drop Left Eye BID   Continuous Infusions: . azithromycin Stopped (02/25/18 1938)  . piperacillin-tazobactam (ZOSYN)  IV Stopped (02/26/18 1811)  . vancomycin Stopped (02/26/18 1718)   PRN Meds:.acetaminophen **OR** acetaminophen, clotrimazole-betamethasone, polyvinyl alcohol, senna-docusate  Micro Results Recent Results (from the past 240 hour(s))  Blood Culture (routine x 2)     Status: None   Collection Time: 02/20/18  7:59 AM  Result Value Ref Range Status   Specimen Description BLOOD LEFT FOREARM  Final   Special Requests   Final    BOTTLES DRAWN AEROBIC AND ANAEROBIC Blood Culture adequate volume   Culture   Final    NO GROWTH 5 DAYS Performed at [facility], [address] [place], [place] [address]    Report Status 02/25/2018 FINAL  Final  Blood Culture (routine x 2)     Status: None   Collection Time:  02/20/18  8:07 AM  Result Value Ref Range Status   Specimen Description BLOOD RIGHT ARM  Final   Special Requests   Final    BOTTLES DRAWN AEROBIC AND ANAEROBIC Blood Culture adequate volume   Culture   Final    NO GROWTH 5 DAYS Performed at [facility], [address], [place], [place] [address]    Report Status 02/25/2018 FINAL  Final  Urine culture     Status: None   Collection Time: 02/24/18 10:47 AM  Result Value Ref Range Status   Specimen Description   Final    URINE, CLEAN CATCH Performed at [facility], [address], [place], [place] [address]    Special Requests   Final    NONE Performed at [facility], [address] [place], [place] [address]    Culture   Final    NO GROWTH Performed at [facility], [place] [address], [place], [place] [address]    Report Status 02/26/2018 FINAL  Final  Culture, blood (routine x 2)     Status: None (Preliminary result)   Collection Time: 02/24/18 11:03 AM  Result Value Ref Range Status   Specimen Description BLOOD BLOOD LEFT HAND  Final   Special Requests   Final    BOTTLES DRAWN AEROBIC AND ANAEROBIC Blood Culture adequate volume   Culture   Final    NO GROWTH 2 DAYS Performed at [facility], [address], [place], [place] [address]    Report Status PENDING  Incomplete  Culture, blood (routine x 2)     Status: None (Preliminary result)   Collection Time: 02/24/18 11:03 AM  Result Value Ref Range Status   Specimen Description   Final    BLOOD BLOOD LEFT HAND Performed at [facility], [address], [place], [place] [address]    Special Requests   Final    BOTTLES DRAWN AEROBIC AND ANAEROBIC Blood Culture adequate volume Performed at [facility], [address] [place], [place] [address]    Culture  Setup Time   Final    GRAM POSITIVE COCCI Gram Stain Report Called to,Read Back By and Verified With: DILDY @ 0802 ON [id number] BY HENDERSON L.  ANAEROBIC BOTTLE ONLY CRITICAL RESULT CALLED TO, READ BACK BY AND  VERIFIED WITH: S HURTH,PHARMD AT 1152  02/26/18 BY L BENFIELD Performed at [facility], [place] [address], [place], [place] [address]    Culture GRAM POSITIVE COCCI  Final   Report Status PENDING  Incomplete  Blood Culture ID Panel (Reflexed)     Status: None   Collection Time: 02/24/18 11:03 AM  Result Value Ref Range Status   Enterococcus species NOT DETECTED NOT DETECTED Final   Listeria monocytogenes NOT DETECTED NOT DETECTED Final   Staphylococcus species NOT DETECTED NOT DETECTED Final   Staphylococcus aureus NOT DETECTED NOT DETECTED Final   Streptococcus species NOT DETECTED NOT DETECTED Final   Streptococcus agalactiae NOT DETECTED NOT DETECTED Final   Streptococcus pneumoniae NOT DETECTED NOT DETECTED Final   Streptococcus pyogenes NOT DETECTED NOT DETECTED Final   Acinetobacter baumannii NOT DETECTED NOT DETECTED Final   Enterobacteriaceae species NOT DETECTED NOT DETECTED Final   Enterobacter cloacae complex NOT DETECTED NOT DETECTED Final   Escherichia coli NOT DETECTED NOT DETECTED Final   Klebsiella oxytoca NOT DETECTED NOT DETECTED Final   Klebsiella pneumoniae NOT DETECTED NOT DETECTED Final   Proteus species NOT DETECTED NOT DETECTED Final   Serratia marcescens NOT DETECTED NOT DETECTED Final   Haemophilus influenzae NOT DETECTED NOT DETECTED Final   Neisseria meningitidis NOT DETECTED NOT DETECTED Final   Pseudomonas aeruginosa NOT DETECTED NOT DETECTED Final   Candida albicans NOT DETECTED NOT DETECTED Final   Candida glabrata NOT DETECTED NOT DETECTED Final   Candida krusei NOT DETECTED NOT DETECTED Final   Candida parapsilosis NOT DETECTED NOT DETECTED Final   Candida tropicalis NOT DETECTED NOT DETECTED Final    Comment: Performed at [facility], [place] [address], [place], [place] [address]  Respiratory Panel by PCR     Status: None   Collection Time: 02/24/18 10:00 PM  Result Value Ref Range Status   Adenovirus NOT DETECTED NOT DETECTED Final    Coronavirus 229E NOT DETECTED NOT DETECTED Final   Coronavirus HKU1 NOT DETECTED NOT DETECTED Final   Coronavirus NL63 NOT DETECTED NOT DETECTED Final   Coronavirus OC43 NOT DETECTED NOT DETECTED Final   Metapneumovirus NOT DETECTED NOT DETECTED Final   Rhinovirus / Enterovirus NOT DETECTED NOT DETECTED Final   Influenza A NOT DETECTED NOT DETECTED Final   Influenza B NOT DETECTED NOT DETECTED Final   Parainfluenza Virus 1 NOT DETECTED NOT DETECTED Final   Parainfluenza Virus 2 NOT DETECTED NOT DETECTED Final   Parainfluenza Virus 3 NOT DETECTED NOT DETECTED Final   Parainfluenza Virus 4 NOT DETECTED NOT DETECTED Final   Respiratory Syncytial Virus NOT DETECTED NOT DETECTED Final   Bordetella pertussis NOT DETECTED NOT DETECTED Final   Chlamydophila pneumoniae NOT DETECTED NOT DETECTED Final   Mycoplasma pneumoniae NOT DETECTED NOT DETECTED Final    Comment: Performed at [facility], [place] [address] [place], [place] [address]    Radiology Reports Dg Chest 2 View  Result Date: 02/24/2018 CLINICAL DATA:  Shortness of breath EXAM: CHEST - 2 VIEW COMPARISON:  February 19, 2018 chest radiograph and chest CT February 20, 2018 FINDINGS: There is no appreciable edema or consolidation. Heart is borderline enlarged with pulmonary vascular within normal limits. Patient is status post median sternotomy. There are surgical clips in the right upper thoracic region medially. There is no adenopathy appreciable by radiography. Adenopathy seen on recent CT is not appreciable by radiography. There is anterior wedging of T12 vertebral body, stable. IMPRESSION: Cardiac prominence is stable. No frank edema or consolidation. Areas  of postoperative change noted. Electronically Signed   By: Lowella Grip III M.D.   On: 02/24/2018 10:04   Dg Chest 2 View  Result Date: 02/19/2018 CLINICAL DATA:  SOB for several days, legs swelling EXAM: CHEST - 2 VIEW COMPARISON:  08/05/2017 FINDINGS: Status post median  sternotomy. Heart is UPPER normal in size. There is mild perihilar vascular prominence. No overt edema. No focal consolidations or pleural effusions. There is wedge deformity of numerous LOWER thoracic and UPPER lumbar vertebral bodies, stable in appearance. IMPRESSION: 1. Cardiomegaly and mild edema. 2. 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 3.  Recommend DXA bone density exam if not previously performed. Electronically Signed   By: Nolon Nations M.D.   On: 02/19/2018 10:05   Ct Angio Chest Pe W/cm &/or Wo Cm  Result Date: 02/24/2018 CLINICAL DATA:  Shortness of breath fever EXAM: CT ANGIOGRAPHY CHEST WITH CONTRAST TECHNIQUE: Multidetector CT imaging of the chest was performed using the standard protocol during bolus administration of intravenous contrast. Multiplanar CT image reconstructions and MIPs were obtained to evaluate the vascular anatomy. CONTRAST:  75 mL Isovue 370 intravenous COMPARISON:  Chest x-ray 02/24/2018, CT chest 02/20/2018 FINDINGS: Cardiovascular: Limited opacification of pulmonary arterial system. No gross acute central filling defects are seen. Nonaneurysmal aorta. Mild aortic atherosclerosis. Coronary vascular calcification. No pericardial effusion. Mediastinum/Nodes: Midline trachea. Coarse calcification in the left lobe of the thyroid. Multiple enlarged mediastinal lymph nodes. Right paratracheal lymph node measures 13 mm. Left perihilar lymph node measures 18 mm. Right hilar lymph node measures 18 mm. Subcarinal lymph node nodes measure up to 2 cm. Esophagus within normal limits. Post CABG changes. Lungs/Pleura: Scattered areas of mild hazy density. No consolidation or pleural effusion. Upper Abdomen: Cyst upper pole left kidney.  No acute abnormality Musculoskeletal: No chest wall abnormality. No acute or significant osseous findings. Sternotomy. Review of the MIP images confirms the above findings. IMPRESSION: 1. Suboptimal contrast opacification  which limits evaluation for pulmonary embolus. No gross central embolus is seen. 2. No focal pulmonary consolidations. Mild bilateral hazy lung densities which could be due to small airways disease or minimal edema. 3. Mediastinal and hilar adenopathy, possibly reactive but consider short interval CT follow-up in 3-6 months to exclude lymphoproliferative abnormality. Aortic Atherosclerosis (ICD10-I70.0). Electronically Signed   By: Donavan Foil M.D.   On: 02/24/2018 14:12   Ct Angio Chest Pe W And/or Wo Contrast  Result Date: 02/20/2018 CLINICAL DATA:  Shortness of breath for 1 month. EXAM: CT ANGIOGRAPHY CHEST WITH CONTRAST TECHNIQUE: Multidetector CT imaging of the chest was performed using the standard protocol during bolus administration of intravenous contrast. Multiplanar CT image reconstructions and MIPs were obtained to evaluate the vascular anatomy. CONTRAST:  137mL ISOVUE-370 IOPAMIDOL (ISOVUE-370) INJECTION 76% COMPARISON:  Abdominal CT 02/24/2017 FINDINGS: Cardiovascular: Satisfactory opacification of the pulmonary arteries to the segmental level. No evidence of pulmonary embolism. Normal heart size. No pericardial effusion. Extensive atherosclerotic calcification of the coronaries, status post CABG. No acute aortic finding. Mediastinum/Nodes: Symmetric gynecomastia. Right hemithyroidectomy. Subcentimeter nodule in the lower pole left thyroid. Right hilar adenopathy that is asymmetric. Sub-carinal adenopathy measuring 22 mm short axis. These were not seen on a 02/25/2007 abdominal CT which covered much of the lower mediastinum. Lungs/Pleura: There is no edema, consolidation, effusion, or pneumothorax. Mild fissure thickening and occasional interlobular septal thickening which is likely atelectatic. No definite edema. Upper Abdomen: Cholecystectomy. Left upper pole renal cystic density. Musculoskeletal: No acute or aggressive finding. Review of the  MIP images confirms the above findings. IMPRESSION:  1. Negative for pulmonary embolism or other acute finding. 2. Right hilar and subcarinal adenopathy not seen on a 2008 abdominal CT. No explainable findings on today's scan. If there is no explainable chronic lung disease or CHF on clinical grounds a follow-up CT could be obtained in 3-6 months. 3. Aortic Atherosclerosis (ICD10-I70.0). Electronically Signed   By: Monte Fantasia M.D.   On: 02/20/2018 09:26    Time Spent in minutes  35   Starlena Beil M.D on 02/26/2018 at 4:25 PM  Between 7am to 7pm - Pager - [phone]  After 7pm go to [email or web address] - password [facility]  [facility] -  Office  [phone]

## 2018-02-26 NOTE — Consult Note (Signed)
Consult requested by: [facility] hospitalist, Dr. Clementeen Graham Consult requested for: Acute on chronic hypoxic respiratory failure  HPI: This is a 77 year old with multiple medical problems including morbid obesity, obstructive sleep apnea, diabetes, hypertension, coronary artery occlusive disease, peripheral arterial disease, chronic kidney disease who had been in the hospital with increased shortness of breath earlier this month.  He had done fairly well after discharge but got much worse on the morning of admission.  He was not able to get out of bed and could only walk very short distances.  He normally is limited by his breathing although his wife who provides the history seems to minimize that.  He is not complained of any chest pain but apparently he did not have chest pain at the time of his coronary bypass.  Chest x-ray and CT angiogram which I have personally reviewed did not show definitive pneumonia and did not show pulmonary emboli.  There are some bilateral hazy lung densities which may indicate infection.  His white blood count was up.  His lactate was up.  He was felt to be potentially septic was started on broad-spectrum antibiotics.  He has been febrile 202.  Initially he was comfortable on room air with O2 saturations in the mid 90s but he has since been transitioned to nonrebreather mask and to BiPAP.  He is very sleepy.  Blood gas earlier during admission did not show elevated PCO2  Past Medical History:  Diagnosis Date  . C. difficile diarrhea   . 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 Coronary artery disease    Status post CABG in 2007 [facility])  . 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 GERD (gastroesophageal reflux disease)   . Glaucoma   . Hypertension   . Peripheral vascular disease ([place])   . Sleep apnea   . Stroke [facility]) 2008, 2014, 2015     Family History  Problem Relation Age of Onset  .  Diabetes Mother   . Heart failure Mother   . Hypertension Mother   . Diabetes Father   . Heart failure Father   . Hypertension Father   . Diabetes Sister   . Heart failure Sister   . Hypertension Sister   . Hyperlipidemia Sister   . Diabetes Brother   . Heart failure Brother   . Hypertension Brother      Social History   Socioeconomic History  . Marital status: Married    Spouse name: Not on file  . Number of children: Not on file  . Years of education: Not on file  . Highest education level: Not on file  Occupational History  . Not on file  Social Needs  . Financial resource strain: Not on file  . Food insecurity:    Worry: Not on file    Inability: Not on file  . Transportation needs:    Medical: Not on file    Non-medical: Not on file  Tobacco Use  . Smoking status: Former Smoker    Types: Cigarettes  . Smokeless tobacco: Never Used  Substance and Sexual Activity  . Alcohol use: No  . Drug use: No  . Sexual activity: Not Currently  Lifestyle  . Physical activity:    Days per week: Not on file    Minutes per session: Not on file  . Stress: Not on file  Relationships  . Social connections:    Talks on  phone: Not on file    Gets together: Not on file    Attends religious service: Not on file    Active member of club or organization: Not on file    Attends meetings of clubs or organizations: Not on file    Relationship status: Not on file  Other Topics Concern  . Not on file  Social History Narrative  . Not on file     ROS: Except as mentioned 10 point review of systems is negative    Objective: Vital signs in last 24 hours: Temp:  [98.7 F (37.1 C)-102.5 F (39.2 C)] 98.8 F (37.1 C) (04/28 0436) Pulse Rate:  [83-89] 86 (04/28 0436) Resp:  [18-30] 18 (04/28 0436) BP: (124-137)/(53-89) 137/53 (04/28 0436) SpO2:  [95 %-100 %] 98 % (04/28 1700) Weight change:  Last BM Date: 02/24/18  Intake/Output from previous day: 04/27 0701 - 04/28  0700 In: 1460 [P.O.:860; IV Piggyback:600] Out: 150 [Urine:150]  PHYSICAL EXAM Constitutional: He is an obese African-American male with BiPAP in place.  He is somnolent but does arouse when I touch him.  Eyes: Pupils react EOMI.  Ears nose mouth and throat: His hearing seems to be grossly normal.  I cannot see his throat well because of the BiPAP.  Cardiovascular: His heart is regular with normal heart sounds which are somewhat distant.  He has a previous midline sternotomy scar.  Respiratory: His respiratory rate is increased.  His lungs are fairly clear.  Gastrointestinal: His abdomen is soft obese with no masses.  No tenderness.  He has a long presumably cholecystectomy scar in the right upper quadrant musculoskeletal difficult to assess because of his somnolence neurological: He is somnolent and the rest is difficult to assess because of that.  Psychiatric: Unable to assess  Lab Results: Basic Metabolic Panel: Recent Labs    02/24/18 1451 02/25/18 0824  NA 137 136  K 3.9 4.7  CL 101 102  CO2 22 21*  GLUCOSE 51* 142*  BUN 28* 19  CREATININE 1.67* 1.40*  CALCIUM 8.7* 8.1*   Liver Function Tests: Recent Labs    02/24/18 1451  AST 25  ALT 22  ALKPHOS 52  BILITOT 0.7  PROT 7.1  ALBUMIN 3.0*   No results for input(s): LIPASE, AMYLASE in the last 72 hours. No results for input(s): AMMONIA in the last 72 hours. CBC: Recent Labs    02/24/18 1103 02/25/18 0824  WBC 10.8* 11.4*  NEUTROABS 7.2  --   HGB 9.6* 9.5*  HCT 30.3* 30.6*  MCV 98.1 99.4  PLT 342 349   Cardiac Enzymes: No results for input(s): CKTOTAL, CKMB, CKMBINDEX, TROPONINI in the last 72 hours. BNP: No results for input(s): PROBNP in the last 72 hours. D-Dimer: No results for input(s): DDIMER in the last 72 hours. CBG: Recent Labs    02/25/18 1630 02/25/18 1801 02/25/18 2035 02/26/18 0316 02/26/18 0429 02/26/18 0758  GLUCAP 63* 154* 63* 44* 144* 118*   Hemoglobin A1C: No results for input(s):  HGBA1C in the last 72 hours. Fasting Lipid Panel: No results for input(s): CHOL, HDL, LDLCALC, TRIG, CHOLHDL, LDLDIRECT in the last 72 hours. Thyroid Function Tests: Recent Labs    02/25/18 0824  TSH 1.039   Anemia Panel: No results for input(s): VITAMINB12, FOLATE, FERRITIN, TIBC, IRON, RETICCTPCT in the last 72 hours. Coagulation: No results for input(s): LABPROT, INR in the last 72 hours. Urine Drug Screen: Drugs of Abuse     Component Value Date/Time   LABOPIA  NONE DETECTED 11/20/2013 1304   COCAINSCRNUR NONE DETECTED 11/20/2013 1304   LABBENZ NONE DETECTED 11/20/2013 1304   AMPHETMU NONE DETECTED 11/20/2013 1304   THCU NONE DETECTED 11/20/2013 1304   LABBARB NONE DETECTED 11/20/2013 1304    Alcohol Level: No results for input(s): ETH in the last 72 hours. Urinalysis: Recent Labs    02/24/18 1047  COLORURINE YELLOW  LABSPEC 1.014  PHURINE 6.0  GLUCOSEU NEGATIVE  HGBUR SMALL*  BILIRUBINUR NEGATIVE  KETONESUR NEGATIVE  PROTEINUR NEGATIVE  NITRITE NEGATIVE  LEUKOCYTESUR NEGATIVE   Misc. Labs:   ABGS: Recent Labs    02/24/18 2320  PHART 7.420  PO2ART 150.0*  HCO3 22.4     MICROBIOLOGY: Recent Results (from the past 240 hour(s))  Blood Culture (routine x 2)     Status: None   Collection Time: 02/20/18  7:59 AM  Result Value Ref Range Status   Specimen Description BLOOD LEFT FOREARM  Final   Special Requests   Final    BOTTLES DRAWN AEROBIC AND ANAEROBIC Blood Culture adequate volume   Culture   Final    NO GROWTH 5 DAYS Performed at [facility], [address] [place], [place] [address]    Report Status 02/25/2018 FINAL  Final  Blood Culture (routine x 2)     Status: None   Collection Time: 02/20/18  8:07 AM  Result Value Ref Range Status   Specimen Description BLOOD RIGHT ARM  Final   Special Requests   Final    BOTTLES DRAWN AEROBIC AND ANAEROBIC Blood Culture adequate volume   Culture   Final    NO GROWTH 5 DAYS Performed at [facility], [address], [place], [place] [address]    Report Status 02/25/2018 FINAL  Final  Culture, blood (routine x 2)     Status: None (Preliminary result)   Collection Time: 02/24/18 11:03 AM  Result Value Ref Range Status   Specimen Description BLOOD BLOOD LEFT HAND  Final   Special Requests   Final    BOTTLES DRAWN AEROBIC AND ANAEROBIC Blood Culture adequate volume   Culture   Final    NO GROWTH 2 DAYS Performed at [facility], [address], [place], [place] [address]    Report Status PENDING  Incomplete  Culture, blood (routine x 2)     Status: None (Preliminary result)   Collection Time: 02/24/18 11:03 AM  Result Value Ref Range Status   Specimen Description BLOOD BLOOD LEFT HAND  Final   Special Requests   Final    BOTTLES DRAWN AEROBIC AND ANAEROBIC Blood Culture adequate volume   Culture  Setup Time   Final    GRAM POSITIVE COCCI ANA Gram Stain Report Called to,Read Back By and Verified With: DILDY @ [id number] NT [id number] BY HENDERSON L.  Performed at [facility], [address], [place], [place] [address]    Culture PENDING  Incomplete   Report Status PENDING  Incomplete  Respiratory Panel by PCR     Status: None   Collection Time: 02/24/18 10:00 PM  Result Value Ref Range Status   Adenovirus NOT DETECTED NOT DETECTED Final   Coronavirus 229E NOT DETECTED NOT DETECTED Final   Coronavirus HKU1 NOT DETECTED NOT DETECTED Final   Coronavirus NL63 NOT DETECTED NOT DETECTED Final   Coronavirus OC43 NOT DETECTED NOT DETECTED Final   Metapneumovirus NOT DETECTED NOT DETECTED Final   Rhinovirus / Enterovirus NOT DETECTED NOT DETECTED Final   Influenza A NOT DETECTED NOT DETECTED  Final   Influenza B NOT DETECTED NOT DETECTED Final   Parainfluenza Virus 1 NOT DETECTED NOT DETECTED Final   Parainfluenza Virus 2 NOT DETECTED NOT DETECTED Final   Parainfluenza Virus 3 NOT DETECTED NOT DETECTED Final   Parainfluenza Virus 4 NOT DETECTED NOT DETECTED Final   Respiratory Syncytial Virus  NOT DETECTED NOT DETECTED Final   Bordetella pertussis NOT DETECTED NOT DETECTED Final   Chlamydophila pneumoniae NOT DETECTED NOT DETECTED Final   Mycoplasma pneumoniae NOT DETECTED NOT DETECTED Final    Comment: Performed at [facility], [place] [address] [place], [place] [address]    Studies/Results: Dg Chest 2 View  Result Date: 02/24/2018 CLINICAL DATA:  Shortness of breath EXAM: CHEST - 2 VIEW COMPARISON:  February 19, 2018 chest radiograph and chest CT February 20, 2018 FINDINGS: There is no appreciable edema or consolidation. Heart is borderline enlarged with pulmonary vascular within normal limits. Patient is status post median sternotomy. There are surgical clips in the right upper thoracic region medially. There is no adenopathy appreciable by radiography. Adenopathy seen on recent CT is not appreciable by radiography. There is anterior wedging of T12 vertebral body, stable. IMPRESSION: Cardiac prominence is stable. No frank edema or consolidation. Areas of postoperative change noted. Electronically Signed   By: Lowella Grip III M.D.   On: 02/24/2018 10:04   Ct Angio Chest Pe W/cm &/or Wo Cm  Result Date: 02/24/2018 CLINICAL DATA:  Shortness of breath fever EXAM: CT ANGIOGRAPHY CHEST WITH CONTRAST TECHNIQUE: Multidetector CT imaging of the chest was performed using the standard protocol during bolus administration of intravenous contrast. Multiplanar CT image reconstructions and MIPs were obtained to evaluate the vascular anatomy. CONTRAST:  75 mL Isovue 370 intravenous COMPARISON:  Chest x-ray 02/24/2018, CT chest 02/20/2018 FINDINGS: Cardiovascular: Limited opacification of pulmonary arterial system. No gross acute central filling defects are seen. Nonaneurysmal aorta. Mild aortic atherosclerosis. Coronary vascular calcification. No pericardial effusion. Mediastinum/Nodes: Midline trachea. Coarse calcification in the left lobe of the thyroid. Multiple enlarged mediastinal lymph nodes.  Right paratracheal lymph node measures 13 mm. Left perihilar lymph node measures 18 mm. Right hilar lymph node measures 18 mm. Subcarinal lymph node nodes measure up to 2 cm. Esophagus within normal limits. Post CABG changes. Lungs/Pleura: Scattered areas of mild hazy density. No consolidation or pleural effusion. Upper Abdomen: Cyst upper pole left kidney.  No acute abnormality Musculoskeletal: No chest wall abnormality. No acute or significant osseous findings. Sternotomy. Review of the MIP images confirms the above findings. IMPRESSION: 1. Suboptimal contrast opacification which limits evaluation for pulmonary embolus. No gross central embolus is seen. 2. No focal pulmonary consolidations. Mild bilateral hazy lung densities which could be due to small airways disease or minimal edema. 3. Mediastinal and hilar adenopathy, possibly reactive but consider short interval CT follow-up in 3-6 months to exclude lymphoproliferative abnormality. Aortic Atherosclerosis (ICD10-I70.0). Electronically Signed   By: Donavan Foil M.D.   On: 02/24/2018 14:12    Medications:  Prior to Admission:  Medications Prior to Admission  Medication Sig Dispense Refill Last Dose  . acetaminophen (TYLENOL) 650 MG CR tablet Take 1,300 mg by mouth every 8 (eight) hours as needed for pain.   02/23/2018 at Unknown time  . amLODipine (NORVASC) 10 MG tablet Take 10 mg by mouth daily.   02/23/2018 at Unknown time  . atorvastatin (LIPITOR) 80 MG tablet Take 40 mg by mouth every evening.    02/23/2018 at Unknown time  . atropine 1 % ophthalmic  solution Place 1 drop into the left eye daily.    02/23/2018 at Unknown time  . Brinzolamide-Brimonidine (SIMBRINZA) 1-0.2 % SUSP Apply 1 drop to eye 2 (two) times daily.   02/23/2018 at Unknown time  . carboxymethylcellulose (REFRESH PLUS) 0.5 % SOLN Place 1 drop into both eyes 4 (four) times daily as needed.   02/23/2018 at Unknown time  . dipyridamole-aspirin (AGGRENOX) 200-25 MG per 12 hr capsule Take  1 capsule by mouth 2 (two) times daily.   02/23/2018 at Unknown time  . folic acid (FOLVITE) 1 MG tablet Take 1 mg by mouth.   02/23/2018 at Unknown time  . furosemide (LASIX) 20 MG tablet Take 1 tablet (20 mg total) by mouth daily. 30 tablet 0 02/23/2018 at Unknown time  . insulin aspart protamine- aspart (NOVOLOG MIX 70/30) (70-30) 100 UNIT/ML injection Inject into the skin. Patient takes 40 units in the morning and 50 units at night.   02/24/2018 at Unknown time  . Ipratropium-Albuterol (COMBIVENT RESPIMAT) 20-100 MCG/ACT AERS respimat Inhale 1 puff into the lungs every 6 (six) hours.   02/23/2018 at Unknown time  . ketorolac (ACULAR) 0.5 % ophthalmic solution Place 1 drop into the right eye 4 (four) times daily.    02/23/2018 at Unknown time  . losartan (COZAAR) 100 MG tablet Take 100 mg by mouth daily.     02/23/2018 at Unknown time  . metFORMIN (GLUCOPHAGE) 1000 MG tablet Take 1,000 mg by mouth 2 (two) times daily with a meal.     02/24/2018 at Unknown time  . metoprolol (LOPRESSOR) 50 MG tablet Take 0.5 tablets (25 mg total) by mouth 2 (two) times daily. FOR HEART/BLOOD PRESSURE. HOLD FOR SYSTOLIC BLOOD PRESSURE LESS THAN 1OO/HEART RATE LESS THAN 60   02/23/2018 at 8:30am  . pioglitazone (ACTOS) 30 MG tablet Take 30 mg by mouth daily.    02/24/2018 at Unknown time  . potassium chloride (K-DUR,KLOR-CON) 10 MEQ tablet Take 2 tablets (20 mEq total) by mouth daily. Take with lasix 60 tablet 2 02/23/2018 at Unknown time  . spironolactone (ALDACTONE) 25 MG tablet Take 25 mg by mouth daily.   02/23/2018 at Unknown time  . tamsulosin (FLOMAX) 0.4 MG CAPS capsule Take 2 capsules (0.8 mg total) by mouth every evening. 60 capsule 3 02/23/2018 at Unknown time  . timolol (BETIMOL) 0.5 % ophthalmic solution Place 1 drop into the left eye 2 (two) times daily.   02/23/2018 at Unknown time  . Adalimumab 80 MG/0.8ML & 40MG /0.4ML PNKT Inject 1 Dose into the skin every 14 (fourteen) days.   02/20/2018  .  clotrimazole-betamethasone (LOTRISONE) cream Apply 1 application topically 2 (two) times daily. (Patient taking differently: Apply 1 application topically as needed. ) 30 g 1 unknown  . methotrexate (RHEUMATREX) 2.5 MG tablet Take 20 mg by mouth once a week.   02/17/2018   Scheduled: . atorvastatin  40 mg Oral QPM  . atropine  1 drop Left Eye Daily  . dipyridamole-aspirin  1 capsule Oral BID  . enoxaparin (LOVENOX) injection  60 mg Subcutaneous Q24H  . insulin aspart  0-15 Units Subcutaneous TID WC  . insulin aspart  0-5 Units Subcutaneous QHS  . insulin aspart protamine- aspart  40 Units Subcutaneous Q breakfast   And  . insulin aspart protamine- aspart  50 Units Subcutaneous Q supper  . ipratropium-albuterol  3 mL Nebulization Q6H  . ketorolac  1 drop Right Eye QID  . LORazepam  0.5 mg Intravenous Q6H  . losartan  100 mg Oral Daily  . metoprolol tartrate  25 mg Oral BID  . pioglitazone  30 mg Oral Daily  . potassium chloride  20 mEq Oral Daily  . spironolactone  25 mg Oral Daily  . tamsulosin  0.8 mg Oral QPM  . timolol  1 drop Left Eye BID   Continuous: . azithromycin Stopped (02/25/18 1938)  . piperacillin-tazobactam (ZOSYN)  IV 3.375 g (02/26/18 0617)  . vancomycin Stopped (02/25/18 1730)   [id number] **OR** acetaminophen, clotrimazole-betamethasone, polyvinyl alcohol, senna-docusate  Assesment: He was admitted with sepsis.  The source of sepsis is not clear.  He does have some hazy abnormalities on CT angiogram which could indicate pneumonia.  He has hypoxia but did not have hypercapnia on previous blood gas but he is so sleepy I think we need to repeat that.  He has 1 of 2+ blood cultures for gram-positive cocci which are not identified yet  He may have pneumonia and I think is reasonable to repeat a chest x-ray  He has coronary disease status post bypass grafting  He has previous history of stroke  He has sleep apnea and uses CPAP at home but he is on BiPAP now he  is still sleepy Principal Problem:   Sepsis ([place]) Active Problems:   HTN (hypertension)   DM (diabetes mellitus) type II controlled with renal manifestation (H[facility])   CAD (coronary artery disease)   Obesity, Class III, BMI 40-49.9 (morbid obesity) ([place])   History of stroke   OSA (obstructive sleep apnea)   Dyspnea   Lactic acidosis   Acute-on-chronic kidney injury ([place])    Plan: Repeat blood gas.  Repeat chest x-ray.  Discussed with Dr. Clementeen Graham.  I think we need to move him to stepdown for closer observation considering his somnolence  Thanks for allowing me to participate in his care  LOS: 2 days   Caius Silbernagel L 02/26/2018, 9:28 AM

## 2018-02-26 NOTE — Progress Notes (Signed)
Pt placed back on bipap from 4L [place] d/t high respiratory rate. Tylenol 650mg  given- d/t temperature 100.8. Will continue to monitor pt

## 2018-02-27 DIAGNOSIS — N182 Chronic kidney disease, stage 2 (mild): Secondary | ICD-10-CM

## 2018-02-27 DIAGNOSIS — J9601 Acute respiratory failure with hypoxia: Secondary | ICD-10-CM

## 2018-02-27 LAB — BLOOD GAS, ARTERIAL
Acid-base deficit: 0.7 mmol/L (ref 0.0–2.0)
Bicarbonate: 24.2 mmol/L (ref 20.0–28.0)
Delivery systems: POSITIVE
Drawn by: 22223
Expiratory PAP: 5
FIO2: 30
Inspiratory PAP: 15
O2 Saturation: 90.7 %
PEEP: 5 cmH2O
RATE: 16 resp/min
pCO2 arterial: 29.4 mmHg — ABNORMAL LOW (ref 32.0–48.0)
pH, Arterial: 7.491 — ABNORMAL HIGH (ref 7.350–7.450)
pO2, Arterial: 57 mmHg — ABNORMAL LOW (ref 83.0–108.0)

## 2018-02-27 LAB — BASIC METABOLIC PANEL
Anion gap: 12 (ref 5–15)
BUN: 22 mg/dL — ABNORMAL HIGH (ref 6–20)
CO2: 22 mmol/L (ref 22–32)
Calcium: 7.9 mg/dL — ABNORMAL LOW (ref 8.9–10.3)
Chloride: 103 mmol/L (ref 101–111)
Creatinine, Ser: 1.69 mg/dL — ABNORMAL HIGH (ref 0.61–1.24)
GFR calc Af Amer: 44 mL/min — ABNORMAL LOW (ref >60)
GFR calc non Af Amer: 38 mL/min — ABNORMAL LOW (ref >60)
Glucose, Bld: 206 mg/dL — ABNORMAL HIGH (ref 65–99)
Potassium: 4.4 mmol/L (ref 3.5–5.1)
Sodium: 137 mmol/L (ref 135–145)

## 2018-02-27 LAB — GLUCOSE, CAPILLARY
Glucose-Capillary: 251 mg/dL — ABNORMAL HIGH (ref 65–99)
Glucose-Capillary: 245 mg/dL — ABNORMAL HIGH (ref 65–99)
Glucose-Capillary: 304 mg/dL — ABNORMAL HIGH (ref 65–99)

## 2018-02-27 MED ORDER — METHYLPREDNISOLONE SODIUM SUCC 40 MG IJ SOLR
40.0000 mg | Freq: Two times a day (BID) | INTRAMUSCULAR | Status: DC
  Administered 2018-02-27 – 2018-03-02 (×7): 40 mg via INTRAVENOUS
  Filled 2018-02-27 (×7): qty 1

## 2018-02-27 MED ORDER — SODIUM CHLORIDE 0.9% FLUSH: 10.0000 mL | INTRAVENOUS | Status: DC | PRN

## 2018-02-27 NOTE — Progress Notes (Addendum)
Inpatient Diabetes Program Recommendations  AACE/ADA: New Consensus Statement on Inpatient Glycemic Control (2019)  Target Ranges:  Prepandial:   less than 140 mg/dL      Peak postprandial:   less than 180 mg/dL (1-2 hours)      Critically ill patients:  140 - 180 mg/dL  Results for [name], [name] (MRN [id number]) as of 02/27/2018 07:54  Ref. Range 02/27/2018 05:00  Glucose Latest Ref Range: 65 - 99 mg/dL 206 (H)   Results for [name], [name] (MRN [id number]) as of 02/27/2018 07:54  Ref. Range 02/26/2018 03:16 02/26/2018 04:29 02/26/2018 07:58 02/26/2018 11:55 02/26/2018 16:27 02/26/2018 21:33  Glucose-Capillary Latest Ref Range: 65 - 99 mg/dL 44 (LL) 144 (H) 118 (H) 196 (H)  Novolog 3 units 159 (H)  Novolog 3 units 173 (H)  Results for [name], [name] (MRN [id number]) as of 02/27/2018 07:54  Ref. Range 02/25/2018 08:00 02/25/2018 11:36 02/25/2018 16:30 02/25/2018 18:01 02/25/2018 20:35  Glucose-Capillary Latest Ref Range: 65 - 99 mg/dL 124 (H)  Novolog 2 units 159 (H)  70/30 40 units  Novolog 3 units  Actos 30 mg 63 (L) 154 (H)  70/30 50 units 63 (L)   Review of Glycemic Control  Diabetes history: DM2 Outpatient Diabetes medications: 70/30 40 units QAM, 70/30 50 units QHS, Metformin 1000 mg BID, Actos 30 mg daily Current orders for Inpatient glycemic control: Novolog 0-15 units TID with meals, Novolog 0-5 units QHS, Actos 30 mg daily.   Inpatient Diabetes Program Recommendations: Insulin - Basal: Noted patient was ordered 70/30 40 units QAM and 50 units QPM and experienced hypoglyemia so 70/30 was discontinued on 02/26/18 and no basal insulin reordered. If glucose is consistently greater than 180 mg/dl, please consider ordering Lantus 10 units Q24H. Oral Agents: While inpatient, please consider discontinuing Actos.  NOTE: In reviewing chart, noted patient received 70/30 insulin twice on 02/25/18 and since patient experienced hypoglycemia the 70/30 insulin was discontinued completely. Currently  patient is not ordered any basal insulin and lab glucose 206 mg/dl this morning. Expect patient will need additional insulin ordered.   Thanks, Barnie Alderman, RN, MSN, CDE Diabetes Coordinator Inpatient Diabetes Program [phone] (Team Pager from 8am to 5pm)

## 2018-02-27 NOTE — Progress Notes (Addendum)
Subjective: He was able to transition to high flow nasal cannula yesterday but then required BiPAP again last night because he had increased work of breathing and increased respiratory rate.  He says he feels okay this morning.  He is more alert than when I saw him earlier.  He had CT of the abdomen and pelvis done and that showed some more groundglass opacity in his lower lobes and I think what we are dealing with is pneumonia with an atypical appearance.  He still has had some fever.  Objective: Vital signs in last 24 hours: Temp:  [98.1 F (36.7 C)-102.6 F (39.2 C)] 99.5 F (37.5 C) (04/29 0413) Pulse Rate:  [79-102] 96 (04/29 0600) Resp:  [13-34] 31 (04/29 0600) BP: (117-169)/(55-130) 153/67 (04/29 0600) SpO2:  [94 %-100 %] 97 % (04/29 0500) FiO2 (%):  [30 %-35 %] 30 % (04/29 0314) Weight:  [127.5 kg (281 lb 1.4 oz)-127.9 kg (281 lb 15.5 oz)] 127.9 kg (281 lb 15.5 oz) (04/29 0413) Weight change:  Last BM Date: 02/25/18  Intake/Output from previous day: 04/28 0701 - 04/29 0700 In: 1545 [P.O.:695; IV Piggyback:850] Out: 400 [Urine:400]  PHYSICAL EXAM General appearance: More alert than yesterday.  On BiPAP Resp: rhonchi bilaterally Cardio: regular rate and rhythm, S1, S2 normal, no murmur, click, rub or gallop GI: soft, non-tender; bowel sounds normal; no masses,  no organomegaly Extremities: extremities normal, atraumatic, no cyanosis or edema  Lab Results:  Results for orders placed or performed during the hospital encounter of 02/24/18 (from the past 48 hour(s))  Glucose, capillary     Status: Abnormal   Collection Time: 02/25/18  8:00 AM  Result Value Ref Range   Glucose-Capillary 124 (H) 65 - 99 mg/dL  Procalcitonin     Status: None   Collection Time: 02/25/18  8:24 AM  Result Value Ref Range   Procalcitonin <0.10 ng/mL    Comment:        Interpretation: PCT (Procalcitonin) <= 0.5 ng/mL: Systemic infection (sepsis) is not likely. Local bacterial infection is  possible. (NOTE)       Sepsis PCT Algorithm           Lower Respiratory Tract                                      Infection PCT Algorithm    ----------------------------     ----------------------------         PCT < 0.25 ng/mL                PCT < 0.10 ng/mL         Strongly encourage             Strongly discourage   discontinuation of antibiotics    initiation of antibiotics    ----------------------------     -----------------------------       PCT 0.25 - 0.50 ng/mL            PCT 0.10 - 0.25 ng/mL               OR       >80% decrease in PCT            Discourage initiation of  antibiotics      Encourage discontinuation           of antibiotics    ----------------------------     -----------------------------         PCT >= 0.50 ng/mL              PCT 0.26 - 0.50 ng/mL               AND        <80% decrease in PCT             Encourage initiation of                                             antibiotics       Encourage continuation           of antibiotics    ----------------------------     -----------------------------        PCT >= 0.50 ng/mL                  PCT > 0.50 ng/mL               AND         increase in PCT                  Strongly encourage                                      initiation of antibiotics    Strongly encourage escalation           of antibiotics                                     -----------------------------                                           PCT <= 0.25 ng/mL                                                 OR                                        > 80% decrease in PCT                                     Discontinue / Do not initiate                                             antibiotics Performed at [facility], [address], [place], [place] [address]   TSH     Status: None   Collection Time: 02/25/18  8:24 AM  Result Value Ref Range   TSH 1.039 0.350 - 4.500 uIU/mL    Comment:  Performed by a 3rd Generation assay with a functional sensitivity of <=0.01 uIU/mL. Performed at [facility], [address], [place], [place] [address]   Basic metabolic panel     Status: Abnormal   Collection Time: 02/25/18  8:24 AM  Result Value Ref Range   Sodium 136 135 - 145 mmol/L   Potassium 4.7 3.5 - 5.1 mmol/L    Comment: DELTA CHECK NOTED   Chloride 102 101 - 111 mmol/L   CO2 21 (L) 22 - 32 mmol/L   Glucose, Bld 142 (H) 65 - 99 mg/dL   BUN 19 6 - 20 mg/dL   Creatinine, Ser 1.40 (H) 0.61 - 1.24 mg/dL   Calcium 8.1 (L) 8.9 - 10.3 mg/dL   GFR calc non Af Amer 48 (L) >60 mL/min   GFR calc Af Amer 55 (L) >60 mL/min    Comment: (NOTE) The eGFR has been calculated using the CKD EPI equation. This calculation has not been validated in all clinical situations. eGFR's persistently <60 mL/min signify possible Chronic Kidney Disease.    Anion gap 13 5 - 15    Comment: Performed at [facility] [address] [place], [place] [address]  CBC     Status: Abnormal   Collection Time: 02/25/18  8:24 AM  Result Value Ref Range   WBC 11.4 (H) 4.0 - 10.5 K/uL   RBC 3.08 (L) 4.22 - 5.81 MIL/uL   Hemoglobin 9.5 (L) 13.0 - 17.0 g/dL   HCT 30.6 (L) 39.0 - 52.0 %   MCV 99.4 78.0 - 100.0 fL   MCH 30.8 26.0 - 34.0 pg   MCHC 31.0 30.0 - 36.0 g/dL   RDW 15.7 (H) 11.5 - 15.5 %   Platelets 349 150 - 400 K/uL    Comment: Performed at [facility], [address], [place], [place] [address]  Glucose, capillary     Status: Abnormal   Collection Time: 02/25/18 11:36 AM  Result Value Ref Range   Glucose-Capillary 159 (H) 65 - 99 mg/dL  Glucose, capillary     Status: Abnormal   Collection Time: 02/25/18  4:30 PM  Result Value Ref Range   Glucose-Capillary 63 (L) 65 - 99 mg/dL  Glucose, capillary     Status: Abnormal   Collection Time: 02/25/18  6:01 PM  Result Value Ref Range   Glucose-Capillary 154 (H) 65 - 99 mg/dL  Glucose, capillary     Status: Abnormal   Collection Time: 02/25/18  8:35  PM  Result Value Ref Range   Glucose-Capillary 63 (L) 65 - 99 mg/dL   Comment 1 Notify RN    Comment 2 Document in Chart   Glucose, capillary     Status: Abnormal   Collection Time: 02/26/18  3:16 AM  Result Value Ref Range   Glucose-Capillary 44 (LL) 65 - 99 mg/dL   Comment 1 Notify RN    Comment 2 Document in Chart   Glucose, capillary     Status: Abnormal   Collection Time: 02/26/18  4:29 AM  Result Value Ref Range   Glucose-Capillary 144 (H) 65 - 99 mg/dL   Comment 1 Notify RN    Comment 2 Document in Chart   Glucose, capillary     Status: Abnormal   Collection Time: 02/26/18  7:58 AM  Result Value Ref Range   Glucose-Capillary 118 (H) 65 - 99 mg/dL  Procalcitonin  Status: None   Collection Time: 02/26/18  8:35 AM  Result Value Ref Range   Procalcitonin 0.13 ng/mL    Comment:        Interpretation: PCT (Procalcitonin) <= 0.5 ng/mL: Systemic infection (sepsis) is not likely. Local bacterial infection is possible. (NOTE)       Sepsis PCT Algorithm           Lower Respiratory Tract                                      Infection PCT Algorithm    ----------------------------     ----------------------------         PCT < 0.25 ng/mL                PCT < 0.10 ng/mL         Strongly encourage             Strongly discourage   discontinuation of antibiotics    initiation of antibiotics    ----------------------------     -----------------------------       PCT 0.25 - 0.50 ng/mL            PCT 0.10 - 0.25 ng/mL               OR       >80% decrease in PCT            Discourage initiation of                                            antibiotics      Encourage discontinuation           of antibiotics    ----------------------------     -----------------------------         PCT >= 0.50 ng/mL              PCT 0.26 - 0.50 ng/mL               AND        <80% decrease in PCT             Encourage initiation of                                             antibiotics        Encourage continuation           of antibiotics    ----------------------------     -----------------------------        PCT >= 0.50 ng/mL                  PCT > 0.50 ng/mL               AND         increase in PCT                  Strongly encourage                                      initiation of antibiotics    Strongly  encourage escalation           of antibiotics                                     -----------------------------                                           PCT <= 0.25 ng/mL                                                 OR                                        > 80% decrease in PCT                                     Discontinue / Do not initiate                                             antibiotics Performed at [facility], [address], [place], [place] [address]   Basic metabolic panel     Status: Abnormal   Collection Time: 02/26/18  8:35 AM  Result Value Ref Range   Sodium 139 135 - 145 mmol/L   Potassium 4.3 3.5 - 5.1 mmol/L   Chloride 103 101 - 111 mmol/L   CO2 24 22 - 32 mmol/L   Glucose, Bld 116 (H) 65 - 99 mg/dL   BUN 21 (H) 6 - 20 mg/dL   Creatinine, Ser 1.66 (H) 0.61 - 1.24 mg/dL   Calcium 8.2 (L) 8.9 - 10.3 mg/dL   GFR calc non Af Amer 39 (L) >60 mL/min   GFR calc Af Amer 45 (L) >60 mL/min    Comment: (NOTE) The eGFR has been calculated using the CKD EPI equation. This calculation has not been validated in all clinical situations. eGFR's persistently <60 mL/min signify possible Chronic Kidney Disease.    Anion gap 12 5 - 15    Comment: Performed at [facility], [address] [place], [place] [address]  Glucose, capillary     Status: Abnormal   Collection Time: 02/26/18 11:55 AM  Result Value Ref Range   Glucose-Capillary 196 (H) 65 - 99 mg/dL  MRSA PCR Screening     Status: None   Collection Time: 02/26/18 12:53 PM  Result Value Ref Range   MRSA by PCR NEGATIVE NEGATIVE    Comment:        The GeneXpert MRSA Assay (FDA approved  for NASAL specimens only), is one component of a comprehensive MRSA colonization surveillance program. It is not intended to diagnose MRSA infection nor to guide or monitor treatment for MRSA infections. Performed at [facility], [address] [place], [place] [address]   Glucose, capillary     Status: Abnormal   Collection Time: 02/26/18  4:27 PM  Result Value  Ref Range   Glucose-Capillary 159 (H) 65 - 99 mg/dL   Comment 1 Notify RN    Comment 2 Document in Chart   Lactic acid, plasma     Status: None   Collection Time: 02/26/18  5:31 PM  Result Value Ref Range   Lactic Acid, Venous 1.2 0.5 - 1.9 mmol/L    Comment: Performed at [facility], [address] [place], [place] [address]  Glucose, capillary     Status: Abnormal   Collection Time: 02/26/18  9:33 PM  Result Value Ref Range   Glucose-Capillary 173 (H) 65 - 99 mg/dL   Comment 1 Notify RN    Comment 2 Document in Chart   Lactic acid, plasma     Status: None   Collection Time: 02/26/18 11:07 PM  Result Value Ref Range   Lactic Acid, Venous 1.6 0.5 - 1.9 mmol/L    Comment: Performed at [facility], [address] [place], [place] [address]  Blood gas, arterial     Status: Abnormal   Collection Time: 02/27/18  4:30 AM  Result Value Ref Range   FIO2 30.00    Delivery systems BILEVEL POSITIVE AIRWAY PRESSURE    LHR 16 resp/min   Peep/cpap 5.0 cm H20   Inspiratory PAP 15    Expiratory PAP 5    pH, Arterial 7.491 (H) 7.350 - 7.450   pCO2 arterial 29.4 (L) 32.0 - 48.0 mmHg   pO2, Arterial 57.0 (L) 83.0 - 108.0 mmHg   Bicarbonate 24.2 20.0 - 28.0 mmol/L   Acid-base deficit 0.7 0.0 - 2.0 mmol/L   O2 Saturation 90.7 %   Collection site LEFT BRACHIAL    Drawn by [id number]    Sample type ARTERIAL    Allens test (pass/fail) PASS PASS    Comment: Performed at [facility], [address] [place], [place] [address]  Basic metabolic panel     Status: Abnormal   Collection Time: 02/27/18  5:00 AM  Result Value Ref Range    Sodium 137 135 - 145 mmol/L   Potassium 4.4 3.5 - 5.1 mmol/L   Chloride 103 101 - 111 mmol/L   CO2 22 22 - 32 mmol/L   Glucose, Bld 206 (H) 65 - 99 mg/dL   BUN 22 (H) 6 - 20 mg/dL   Creatinine, Ser 1.69 (H) 0.61 - 1.24 mg/dL   Calcium 7.9 (L) 8.9 - 10.3 mg/dL   GFR calc non Af Amer 38 (L) >60 mL/min   GFR calc Af Amer 44 (L) >60 mL/min    Comment: (NOTE) The eGFR has been calculated using the CKD EPI equation. This calculation has not been validated in all clinical situations. eGFR's persistently <60 mL/min signify possible Chronic Kidney Disease.    Anion gap 12 5 - 15    Comment: Performed at [facility], [address] [place], [place] [address]    ABGS Recent Labs    02/27/18 0430  PHART 7.491*  PO2ART 57.0*  HCO3 24.2   CULTURES Recent Results (from the past 240 hour(s))  Blood Culture (routine x 2)     Status: None   Collection Time: 02/20/18  7:59 AM  Result Value Ref Range Status   Specimen Description BLOOD LEFT FOREARM  Final   Special Requests   Final    BOTTLES DRAWN AEROBIC AND ANAEROBIC Blood Culture adequate volume   Culture   Final    NO GROWTH 5 DAYS Performed at [facility], [address] [place], [place]  [address]    Report Status 02/25/2018 FINAL  Final  Blood Culture (routine x 2)     Status: None   Collection Time: 02/20/18  8:07 AM  Result Value Ref Range Status   Specimen Description BLOOD RIGHT ARM  Final   Special Requests   Final    BOTTLES DRAWN AEROBIC AND ANAEROBIC Blood Culture adequate volume   Culture   Final    NO GROWTH 5 DAYS Performed at [facility], [address], [place], [place] [address]    Report Status 02/25/2018 FINAL  Final  Urine culture     Status: None   Collection Time: 02/24/18 10:47 AM  Result Value Ref Range Status   Specimen Description   Final    URINE, CLEAN CATCH Performed at [facility], [address] [place], [place] [address]    Special Requests   Final    NONE Performed at [facility], [address], [place], [place] [address]    Culture   Final    NO GROWTH Performed at [facility], [place] [address] [place], [place] [address]    Report Status 02/26/2018 FINAL  Final  Culture, blood (routine x 2)     Status: None (Preliminary result)   Collection Time: 02/24/18 11:03 AM  Result Value Ref Range Status   Specimen Description BLOOD BLOOD LEFT HAND  Final   Special Requests   Final    BOTTLES DRAWN AEROBIC AND ANAEROBIC Blood Culture adequate volume   Culture   Final    NO GROWTH 3 DAYS Performed at [facility], [address] [place], [place] [address]    Report Status PENDING  Incomplete  Culture, blood (routine x 2)     Status: None (Preliminary result)   Collection Time: 02/24/18 11:03 AM  Result Value Ref Range Status   Specimen Description   Final    BLOOD BLOOD LEFT HAND Performed at [facility], [address] [place], [place] [address]    Special Requests   Final    BOTTLES DRAWN AEROBIC AND ANAEROBIC Blood Culture adequate volume Performed at [facility], [address] [place], [place] [address]    Culture  Setup Time   Final    GRAM POSITIVE COCCI Gram Stain Report Called to,Read Back By and Verified With: DILDY @ 0802 ON [id number] BY HENDERSON L.  ANAEROBIC BOTTLE ONLY CRITICAL RESULT CALLED TO, READ BACK BY AND VERIFIED WITH: S HURTH,PHARMD AT 1152 02/26/18 BY L BENFIELD Performed at [facility], [place] [address] [place], [place] [address]    Culture GRAM POSITIVE COCCI  Final   Report Status PENDING  Incomplete  Blood Culture ID Panel (Reflexed)     Status: None   Collection Time: 02/24/18 11:03 AM  Result Value Ref Range Status   Enterococcus species NOT DETECTED NOT DETECTED Final   Listeria monocytogenes NOT DETECTED NOT DETECTED Final   Staphylococcus species NOT DETECTED NOT DETECTED Final   Staphylococcus aureus NOT DETECTED NOT DETECTED Final   Streptococcus species NOT DETECTED NOT DETECTED Final   Streptococcus  agalactiae NOT DETECTED NOT DETECTED Final   Streptococcus pneumoniae NOT DETECTED NOT DETECTED Final   Streptococcus pyogenes NOT DETECTED NOT DETECTED Final   Acinetobacter baumannii NOT DETECTED NOT DETECTED Final   Enterobacteriaceae species NOT DETECTED NOT DETECTED Final   Enterobacter cloacae complex NOT DETECTED NOT DETECTED Final   Escherichia coli NOT DETECTED NOT DETECTED Final   Klebsiella oxytoca NOT DETECTED NOT DETECTED  Final   Klebsiella pneumoniae NOT DETECTED NOT DETECTED Final   Proteus species NOT DETECTED NOT DETECTED Final   Serratia marcescens NOT DETECTED NOT DETECTED Final   Haemophilus influenzae NOT DETECTED NOT DETECTED Final   Neisseria meningitidis NOT DETECTED NOT DETECTED Final   Pseudomonas aeruginosa NOT DETECTED NOT DETECTED Final   Candida albicans NOT DETECTED NOT DETECTED Final   Candida glabrata NOT DETECTED NOT DETECTED Final   Candida krusei NOT DETECTED NOT DETECTED Final   Candida parapsilosis NOT DETECTED NOT DETECTED Final   Candida tropicalis NOT DETECTED NOT DETECTED Final    Comment: Performed at [facility], [place] [address] [place], [place] [address]  Respiratory Panel by PCR     Status: None   Collection Time: 02/24/18 10:00 PM  Result Value Ref Range Status   Adenovirus NOT DETECTED NOT DETECTED Final   Coronavirus 229E NOT DETECTED NOT DETECTED Final   Coronavirus HKU1 NOT DETECTED NOT DETECTED Final   Coronavirus NL63 NOT DETECTED NOT DETECTED Final   Coronavirus OC43 NOT DETECTED NOT DETECTED Final   Metapneumovirus NOT DETECTED NOT DETECTED Final   Rhinovirus / Enterovirus NOT DETECTED NOT DETECTED Final   Influenza A NOT DETECTED NOT DETECTED Final   Influenza B NOT DETECTED NOT DETECTED Final   Parainfluenza Virus 1 NOT DETECTED NOT DETECTED Final   Parainfluenza Virus 2 NOT DETECTED NOT DETECTED Final   Parainfluenza Virus 3 NOT DETECTED NOT DETECTED Final   Parainfluenza Virus 4 NOT DETECTED NOT DETECTED Final    Respiratory Syncytial Virus NOT DETECTED NOT DETECTED Final   Bordetella pertussis NOT DETECTED NOT DETECTED Final   Chlamydophila pneumoniae NOT DETECTED NOT DETECTED Final   Mycoplasma pneumoniae NOT DETECTED NOT DETECTED Final    Comment: Performed at [facility], [place] [address], [place], [place] [address]  MRSA PCR Screening     Status: None   Collection Time: 02/26/18 12:53 PM  Result Value Ref Range Status   MRSA by PCR NEGATIVE NEGATIVE Final    Comment:        The GeneXpert MRSA Assay (FDA approved for NASAL specimens only), is one component of a comprehensive MRSA colonization surveillance program. It is not intended to diagnose MRSA infection nor to guide or monitor treatment for MRSA infections. Performed at [facility], [address], [place], [place] [address]    Studies/Results: Ct Abdomen Pelvis Wo Contrast  Result Date: 02/26/2018 CLINICAL DATA:  Sepsis. EXAM: CT ABDOMEN AND PELVIS WITHOUT CONTRAST TECHNIQUE: Multidetector CT imaging of the abdomen and pelvis was performed following the standard protocol without IV contrast. COMPARISON:  CT abdomen pelvis dated February 25, 2007. FINDINGS: Lower chest: Mild ground-glass density at the lung bases, unchanged. Hepatobiliary: Hepatic steatosis. Prior cholecystectomy. No biliary dilatation. Pancreas: Unremarkable. No pancreatic ductal dilatation or surrounding inflammatory changes. Spleen: Normal in size without focal abnormality. Adrenals/Urinary Tract: The adrenal glands are unremarkable. Interval increase in size of the 3.2 cm simple cyst arising from the upper pole of the left kidney. Punctate left renal calculi. No ureteral calculi or hydronephrosis. The bladder is unremarkable. Stomach/Bowel: Stomach is within normal limits. Appendix appears normal. No evidence of bowel wall thickening, distention, or inflammatory changes. There are few scattered colonic diverticula. Vascular/Lymphatic: Aortic atherosclerosis. No  enlarged abdominal or pelvic lymph nodes. Reproductive: Prostate is unremarkable. Other: No free fluid or pneumoperitoneum. Musculoskeletal: No acute or significant osseous findings. Chronic mild compression deformity of T12. IMPRESSION: 1.  No acute intra-abdominal process. 2. Hepatic  steatosis. 3. Punctate left nephrolithiasis. 4. Mild ground-glass density at the lung bases is unchanged and may reflect edema or infection. 5.  Aortic atherosclerosis (ICD10-I70.0).  Normal Electronically Signed   By: Titus Dubin M.D.   On: 02/26/2018 19:18   Dg Chest Port 1 View  Result Date: 02/26/2018 CLINICAL DATA:  Hypoxia. EXAM: PORTABLE CHEST 1 VIEW COMPARISON:  Chest x-ray and CT chest dated February 24, 2018. FINDINGS: Stable cardiomegaly. Worsened bilateral perihilar alveolar and interstitial opacities, greater on the right. No pneumothorax or pleural effusion. No acute osseous abnormality. IMPRESSION: Worsening perihilar airspace disease, favored to reflect pulmonary edema. Infection could have a similar appearance. Electronically Signed   By: Titus Dubin M.D.   On: 02/26/2018 17:50    Medications:  Prior to Admission:  Medications Prior to Admission  Medication Sig Dispense Refill Last Dose  . acetaminophen (TYLENOL) 650 MG CR tablet Take 1,300 mg by mouth every 8 (eight) hours as needed for pain.   02/23/2018 at Unknown time  . amLODipine (NORVASC) 10 MG tablet Take 10 mg by mouth daily.   02/23/2018 at Unknown time  . atorvastatin (LIPITOR) 80 MG tablet Take 40 mg by mouth every evening.    02/23/2018 at Unknown time  . atropine 1 % ophthalmic solution Place 1 drop into the left eye daily.    02/23/2018 at Unknown time  . Brinzolamide-Brimonidine (SIMBRINZA) 1-0.2 % SUSP Apply 1 drop to eye 2 (two) times daily.   02/23/2018 at Unknown time  . carboxymethylcellulose (REFRESH PLUS) 0.5 % SOLN Place 1 drop into both eyes 4 (four) times daily as needed.   02/23/2018 at Unknown time  . dipyridamole-aspirin  (AGGRENOX) 200-25 MG per 12 hr capsule Take 1 capsule by mouth 2 (two) times daily.   02/23/2018 at Unknown time  . folic acid (FOLVITE) 1 MG tablet Take 1 mg by mouth.   02/23/2018 at Unknown time  . furosemide (LASIX) 20 MG tablet Take 1 tablet (20 mg total) by mouth daily. 30 tablet 0 02/23/2018 at Unknown time  . insulin aspart protamine- aspart (NOVOLOG MIX 70/30) (70-30) 100 UNIT/ML injection Inject into the skin. Patient takes 40 units in the morning and 50 units at night.   02/24/2018 at Unknown time  . Ipratropium-Albuterol (COMBIVENT RESPIMAT) 20-100 MCG/ACT AERS respimat Inhale 1 puff into the lungs every 6 (six) hours.   02/23/2018 at Unknown time  . ketorolac (ACULAR) 0.5 % ophthalmic solution Place 1 drop into the right eye 4 (four) times daily.    02/23/2018 at Unknown time  . losartan (COZAAR) 100 MG tablet Take 100 mg by mouth daily.     02/23/2018 at Unknown time  . metFORMIN (GLUCOPHAGE) 1000 MG tablet Take 1,000 mg by mouth 2 (two) times daily with a meal.     02/24/2018 at Unknown time  . metoprolol (LOPRESSOR) 50 MG tablet Take 0.5 tablets (25 mg total) by mouth 2 (two) times daily. FOR HEART/BLOOD PRESSURE. HOLD FOR SYSTOLIC BLOOD PRESSURE LESS THAN 1OO/HEART RATE LESS THAN 60   02/23/2018 at 8:30am  . pioglitazone (ACTOS) 30 MG tablet Take 30 mg by mouth daily.    02/24/2018 at Unknown time  . potassium chloride (K-DUR,KLOR-CON) 10 MEQ tablet Take 2 tablets (20 mEq total) by mouth daily. Take with lasix 60 tablet 2 02/23/2018 at Unknown time  . spironolactone (ALDACTONE) 25 MG tablet Take 25 mg by mouth daily.   02/23/2018 at Unknown time  . tamsulosin (FLOMAX) 0.4 MG CAPS capsule Take 2  capsules (0.8 mg total) by mouth every evening. 60 capsule 3 02/23/2018 at Unknown time  . timolol (BETIMOL) 0.5 % ophthalmic solution Place 1 drop into the left eye 2 (two) times daily.   02/23/2018 at Unknown time  . Adalimumab 80 MG/0.8ML & 40MG/0.4ML PNKT Inject 1 Dose into the skin every 14 (fourteen)  days.   02/20/2018  . clotrimazole-betamethasone (LOTRISONE) cream Apply 1 application topically 2 (two) times daily. (Patient taking differently: Apply 1 application topically as needed. ) 30 g 1 unknown  . methotrexate (RHEUMATREX) 2.5 MG tablet Take 20 mg by mouth once a week.   02/17/2018   Scheduled: . atorvastatin  40 mg Oral QPM  . atropine  1 drop Left Eye Daily  . dipyridamole-aspirin  1 capsule Oral BID  . enoxaparin (LOVENOX) injection  60 mg Subcutaneous Q24H  . insulin aspart  0-15 Units Subcutaneous TID WC  . insulin aspart  0-5 Units Subcutaneous QHS  . ipratropium-albuterol  3 mL Nebulization Q6H  . ketorolac  1 drop Right Eye QID  . metoprolol tartrate  25 mg Oral BID  . pioglitazone  30 mg Oral Daily  . potassium chloride  20 mEq Oral Daily  . spironolactone  25 mg Oral Daily  . tamsulosin  0.8 mg Oral QPM  . timolol  1 drop Left Eye BID   Continuous: . azithromycin Stopped (02/26/18 1729)  . piperacillin-tazobactam (ZOSYN)  IV Stopped (02/27/18 0927)  . vancomycin Stopped (02/26/18 1718)   [id number] **OR** acetaminophen, clotrimazole-betamethasone, polyvinyl alcohol, senna-docusate  Assesment: He was admitted with sepsis.  I think this is a pulmonary source.  He is on broad-spectrum antibiotics but with the atypical appearance of his findings on CT I think we should add Levaquin.  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  He has sleep apnea at baseline and has been requiring CPAP here. Principal Problem:   Sepsis ([place]) Active Problems:   HTN (hypertension)   DM (diabetes mellitus) type II controlled with renal manifestation ([facility])   CAD (coronary artery disease)   Obesity, Class III, BMI 40-49.9 (morbid obesity) ([place])   History of stroke   OSA (obstructive sleep apnea)   Dyspnea   Lactic acidosis   Acute-on-chronic kidney injury ([place])    Plan:   Continue other antibiotics.  Add moderate dose Solu-Medrol    LOS: 3 days    Hamda Klutts L 02/27/2018, 7:55 AM

## 2018-02-27 NOTE — Progress Notes (Signed)
Pt placed back on bipap per pt request. Same settings as previous

## 2018-02-27 NOTE — Progress Notes (Addendum)
PROGRESS NOTE                                                                                                                                                                                                             Patient Demographics:    [name], is a 77 y.o. male, DOB - Jun 10, 1941, RCV:[id number]  Admit date - 02/24/2018   Admitting Physician Truett Mainland, DO  Outpatient Primary MD for the patient is [facility], Modena Nunnery, MD  LOS - 3  Outpatient Specialists:  Chief Complaint  Patient presents with  . Shortness of Breath       Brief Narrative 77 year old male with morbid obesity, OSA, diabetes mellitus, hypertension, CAD status post CABG recently hospitalized from 4/22-4/23 with worsening dyspnea with work-up done including echo, CT angiogram of the chest, blood cultures and troponin which were unremarkable.  Patient was discharged home with the impression that this was related to OHS with recommendations to follow-up with PCP and obtain outpatient PFT. Patient reports that he was fine for a few days but had acute worsening of his symptoms with shortness of breath on minimal exertion.  At baseline he only walks about 10-15 feet limited by dyspnea.  Denies any wheezing but had some productive cough.  Denied any fevers at home. In the ED was septic with fever of 101.3 F, hypoxic in the high 80s on room air and a WBC of 10.8 (was normal on recent discharge).  He also had elevated lactic acid of 2.8, normal BNP, UA and chest x-ray without any infiltrate.  He had a repeat CT angiogram of the chest which was negative for PE with questionable bilateral hazy lung densities and some mediastinal hilar adenopathy.  Patient placed on empiric vancomycin and Zosyn for sepsis and given fluid bolus in the ED. Patient admitted for further management of sepsis.   Subjective:   Had temperature spike of 102.6 F yesterday, requiring  BiPAP since yesterday afternoon (was taken off and placed on 4 L in the evening but again had increased work of breathing).  CT abdomen without any acute findings again shows groundglass opacity and possible infection in lower lungs.   Assessment  & Plan :    Principal Problem:   Sepsis ([place]) Source unclear.  1/2 blood culture growing GPC.  On empiric vancomycin, Zosyn and azithromycin.  Ordered urine for Legionella..  Flu PCR and respiratory viral panel negative.  Urine chest x-ray negative for infection.  Still having fever spikes.  CT angiogram of the chest, abdomen and pelvis (without contrast) were unremarkable except for groundglass opacity in the lower lobes. -Patient does complain of low back pain  (midthoracic) since few days before hospitalization.  Once respiratory symptoms are more stable will obtain MRI of the thoracic and lumbar spine if source of sepsis still unclear. -Discussed with pulmonary and started on Solu-Medrol.  Active Problems: Acute respiratory failure with hypoxia (HCC) Increased work of breathing.  Unremarkable CT angiogram of the chest and 2D echo.  ABG with mild hypoxia but normal CO2. Frequently on CPAP since admission, now transition to BiPAP. Continue empiric antibiotics, steroid added.  Continue intermittent Lasix.   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  Avoid nephrotoxins.  Use Lasix as needed.  Holding ARB.  Obesity hypoventilation syndrome As above.  Currently on BiPAP.  Patient is on CPAP at home.  Needs outpatient PFT.   Essential hypertension Stable.  Resume metoprolol once off BiPAP.  Holding losartan. [name] CAD with history of CABG Continue metoprolol, holding losartan for AKI.    DM (diabetes mellitus) type II controlled with renal manifestation hypoglycemia (HCC) Episodes of low blood glucose.  Home dose 70/30 discontinued, hold metformin and Actos.  Monitor on sliding scale coverage for now.  CBG likely will  go up with initiation of steroid.  Will add basal insulin if needed.     Obesity, Class III, BMI 40-49.9 (morbid obesity) (HCC)    History of stroke Continue statin.    Code Status : Full code  Family Communication  : Wife and son at bedside  Disposition Plan  : Home once improved  Barriers For Discharge : Active symptoms  Consults  : None  Procedures  : CT angiogram of the chest, CT abdomen  DVT Prophylaxis  :  Lovenox -   Lab Results  Component Value Date   PLT 349 02/25/2018    Antibiotics  :  Anti-infectives (From admission, onward)   Start     Dose/Rate Route Frequency Ordered Stop   02/25/18 1500  vancomycin (VANCOCIN) 1,500 mg in sodium chloride 0.9 % 500 mL IVPB     1,500 mg 250 mL/hr over 120 Minutes Intravenous Every 24 hours 02/25/18 0850     02/25/18 0930  piperacillin-tazobactam (ZOSYN) IVPB 3.375 g     3.375 g 12.5 mL/hr over 240 Minutes Intravenous Every 8 hours 02/25/18 0849     02/24/18 1630  azithromycin (ZITHROMAX) 500 mg in sodium chloride 0.9 % 250 mL IVPB     500 mg 250 mL/hr over 60 Minutes Intravenous Every 24 hours 02/24/18 1624     02/24/18 1500  vancomycin (VANCOCIN) 2,000 mg in sodium chloride 0.9 % 500 mL IVPB     2,000 mg 250 mL/hr over 120 Minutes Intravenous  Once 02/24/18 1443 02/24/18 1800   02/24/18 1445  piperacillin-tazobactam (ZOSYN) IVPB 3.375 g     3.375 g 100 mL/hr over 30 Minutes Intravenous  Once 02/24/18 1443 02/24/18 1630        Objective:   Vitals:   02/27/18 0700 02/27/18 0759 02/27/18 0800 02/27/18 0900  BP: (!) 157/64  (!) 143/73 (!) 147/61  Pulse: 98  97 92  Resp: (!) 25  (!) 29 (!) 24  Temp:      TempSrc:      SpO2:  94% 93%   Weight:      Height:        Wt Readings from Last 3 Encounters:  02/27/18 127.9 kg (281 lb 15.5 oz)  02/20/18 127.2 kg (280 lb 6.8 oz)  02/19/18 132.5 kg (292 lb)     Intake/Output Summary (Last 24 hours) at 02/27/2018 0916 Last data filed at 02/27/2018 5361 Gross per 24  hour  Intake 1595 ml  Output 400 ml  Net 1195 ml   Physical exam Morbidly obese male on BiPAP, alert and oriented HEENT: On BiPAP Chest: Clear to auscultation bilaterally CVS: Normal S1 and S2, no murmurs GI: Soft, nondistended, nontender Muscular skeletal: Warm, no edema       Data Review:    CBC Recent Labs  Lab 02/21/18 0338 02/24/18 1103 02/25/18 0824  WBC 4.7 10.8* 11.4*  HGB 10.3* 9.6* 9.5*  HCT 32.9* 30.3* 30.6*  PLT 372 342 349  MCV 99.1 98.1 99.4  MCH 31.0 31.1 30.8  MCHC 31.3 31.7 31.0  RDW 15.0 15.5 15.7*  LYMPHSABS  --  2.1  --   MONOABS  --  1.3  --   EOSABS  --  0.1  --   BASOSABS  --  0.1  --     Chemistries  Recent Labs  Lab 02/24/18 1103 02/24/18 1451 02/25/18 0824 02/26/18 0835 02/27/18 0500  NA 137 137 136 139 137  K 4.2 3.9 4.7 4.3 4.4  CL 102 101 102 103 103  CO2 22 22 21* 24 22  GLUCOSE 74 51* 142* 116* 206*  BUN 25* 28* 19 21* 22*  CREATININE 1.49* 1.67* 1.40* 1.66* 1.69*  CALCIUM 8.9 8.7* 8.1* 8.2* 7.9*  AST  --  25  --   --   --   ALT  --  22  --   --   --   ALKPHOS  --  52  --   --   --   BILITOT  --  0.7  --   --   --    ------------------------------------------------------------------------------------------------------------------ No results for input(s): CHOL, HDL, LDLCALC, TRIG, CHOLHDL, LDLDIRECT in the last 72 hours.  Lab Results  Component Value Date   HGBA1C 7.0 (H) 02/20/2018   ------------------------------------------------------------------------------------------------------------------ Recent Labs    02/25/18 0824  TSH 1.039   ------------------------------------------------------------------------------------------------------------------ No results for input(s): VITAMINB12, FOLATE, FERRITIN, TIBC, IRON, RETICCTPCT in the last 72 hours.  Coagulation profile No results for input(s): INR, PROTIME in the last 168 hours.  No results for input(s): DDIMER in the last 72 hours.  Cardiac  Enzymes Recent Labs  Lab 02/20/18 1609 02/20/18 2218 02/21/18 0338  TROPONINI <0.03 <0.03 <0.03   ------------------------------------------------------------------------------------------------------------------    Component Value Date/Time   BNP 54.0 02/24/2018 1103   BNP 87 02/15/2018 1112    Inpatient Medications  Scheduled Meds: . atorvastatin  40 mg Oral QPM  . atropine  1 drop Left Eye Daily  . dipyridamole-aspirin  1 capsule Oral BID  . enoxaparin (LOVENOX) injection  60 mg Subcutaneous Q24H  . insulin aspart  0-15 Units Subcutaneous TID WC  . insulin aspart  0-5 Units Subcutaneous QHS  . ipratropium-albuterol  3 mL Nebulization Q6H  . ketorolac  1 drop Right Eye QID  . methylPREDNISolone (SOLU-MEDROL) injection  40 mg Intravenous Q12H  . metoprolol tartrate  25 mg Oral BID  . pioglitazone  30 mg Oral Daily  . potassium chloride  20 mEq Oral Daily  . spironolactone  25 mg Oral Daily  .  tamsulosin  0.8 mg Oral QPM  . timolol  1 drop Left Eye BID   Continuous Infusions: . azithromycin Stopped (02/26/18 1729)  . piperacillin-tazobactam (ZOSYN)  IV Stopped (02/27/18 0927)  . vancomycin Stopped (02/26/18 1718)   PRN Meds:.acetaminophen **OR** acetaminophen, clotrimazole-betamethasone, polyvinyl alcohol, senna-docusate  Micro Results Recent Results (from the past 240 hour(s))  Blood Culture (routine x 2)     Status: None   Collection Time: 02/20/18  7:59 AM  Result Value Ref Range Status   Specimen Description BLOOD LEFT FOREARM  Final   Special Requests   Final    BOTTLES DRAWN AEROBIC AND ANAEROBIC Blood Culture adequate volume   Culture   Final    NO GROWTH 5 DAYS Performed at [facility], [address], [place], [place] [address]    Report Status 02/25/2018 FINAL  Final  Blood Culture (routine x 2)     Status: None   Collection Time: 02/20/18  8:07 AM  Result Value Ref Range Status   Specimen Description BLOOD RIGHT ARM  Final   Special Requests    Final    BOTTLES DRAWN AEROBIC AND ANAEROBIC Blood Culture adequate volume   Culture   Final    NO GROWTH 5 DAYS Performed at [facility], [address], [place], [place] [address]    Report Status 02/25/2018 FINAL  Final  Urine culture     Status: None   Collection Time: 02/24/18 10:47 AM  Result Value Ref Range Status   Specimen Description   Final    URINE, CLEAN CATCH Performed at [facility], [address], [place], [place] [address]    Special Requests   Final    NONE Performed at [facility], [address], [place], [place] [address]    Culture   Final    NO GROWTH Performed at [facility], [place] [address] [place], [place] [address]    Report Status 02/26/2018 FINAL  Final  Culture, blood (routine x 2)     Status: None (Preliminary result)   Collection Time: 02/24/18 11:03 AM  Result Value Ref Range Status   Specimen Description BLOOD BLOOD LEFT HAND  Final   Special Requests   Final    BOTTLES DRAWN AEROBIC AND ANAEROBIC Blood Culture adequate volume   Culture   Final    NO GROWTH 3 DAYS Performed at [facility], [address], [place], [place] [address]    Report Status PENDING  Incomplete  Culture, blood (routine x 2)     Status: None (Preliminary result)   Collection Time: 02/24/18 11:03 AM  Result Value Ref Range Status   Specimen Description   Final    BLOOD BLOOD LEFT HAND Performed at [facility], [address] [place], [place] [address]    Special Requests   Final    BOTTLES DRAWN AEROBIC AND ANAEROBIC Blood Culture adequate volume Performed at [facility], [address], [place], [place] [address]    Culture  Setup Time   Final    GRAM POSITIVE COCCI Gram Stain Report Called to,Read Back By and Verified With: DILDY @ 0802 ON [id number] BY HENDERSON L.  ANAEROBIC BOTTLE ONLY CRITICAL RESULT CALLED TO, READ BACK BY AND VERIFIED WITH: S HURTH,PHARMD AT 1152 02/26/18 BY L BENFIELD Performed at [facility], [place] [address],  [place], [place] [address]    Culture [facility] POSITIVE COCCI  Final   Report Status PENDING  Incomplete  Blood Culture ID Panel (Reflexed)  Status: None   Collection Time: 02/24/18 11:03 AM  Result Value Ref Range Status   Enterococcus species NOT DETECTED NOT DETECTED Final   Listeria monocytogenes NOT DETECTED NOT DETECTED Final   Staphylococcus species NOT DETECTED NOT DETECTED Final   Staphylococcus aureus NOT DETECTED NOT DETECTED Final   Streptococcus species NOT DETECTED NOT DETECTED Final   Streptococcus agalactiae NOT DETECTED NOT DETECTED Final   Streptococcus pneumoniae NOT DETECTED NOT DETECTED Final   Streptococcus pyogenes NOT DETECTED NOT DETECTED Final   Acinetobacter baumannii NOT DETECTED NOT DETECTED Final   Enterobacteriaceae species NOT DETECTED NOT DETECTED Final   Enterobacter cloacae complex NOT DETECTED NOT DETECTED Final   Escherichia coli NOT DETECTED NOT DETECTED Final   Klebsiella oxytoca NOT DETECTED NOT DETECTED Final   Klebsiella pneumoniae NOT DETECTED NOT DETECTED Final   Proteus species NOT DETECTED NOT DETECTED Final   Serratia marcescens NOT DETECTED NOT DETECTED Final   Haemophilus influenzae NOT DETECTED NOT DETECTED Final   Neisseria meningitidis NOT DETECTED NOT DETECTED Final   Pseudomonas aeruginosa NOT DETECTED NOT DETECTED Final   Candida albicans NOT DETECTED NOT DETECTED Final   Candida glabrata NOT DETECTED NOT DETECTED Final   Candida krusei NOT DETECTED NOT DETECTED Final   Candida parapsilosis NOT DETECTED NOT DETECTED Final   Candida tropicalis NOT DETECTED NOT DETECTED Final    Comment: Performed at [facility], [place] [address], [place], [place] [address]  Respiratory Panel by PCR     Status: None   Collection Time: 02/24/18 10:00 PM  Result Value Ref Range Status   Adenovirus NOT DETECTED NOT DETECTED Final   Coronavirus 229E NOT DETECTED NOT DETECTED Final   Coronavirus HKU1 NOT DETECTED NOT DETECTED Final   Coronavirus NL63  NOT DETECTED NOT DETECTED Final   Coronavirus OC43 NOT DETECTED NOT DETECTED Final   Metapneumovirus NOT DETECTED NOT DETECTED Final   Rhinovirus / Enterovirus NOT DETECTED NOT DETECTED Final   Influenza A NOT DETECTED NOT DETECTED Final   Influenza B NOT DETECTED NOT DETECTED Final   Parainfluenza Virus 1 NOT DETECTED NOT DETECTED Final   Parainfluenza Virus 2 NOT DETECTED NOT DETECTED Final   Parainfluenza Virus 3 NOT DETECTED NOT DETECTED Final   Parainfluenza Virus 4 NOT DETECTED NOT DETECTED Final   Respiratory Syncytial Virus NOT DETECTED NOT DETECTED Final   Bordetella pertussis NOT DETECTED NOT DETECTED Final   Chlamydophila pneumoniae NOT DETECTED NOT DETECTED Final   Mycoplasma pneumoniae NOT DETECTED NOT DETECTED Final    Comment: Performed at [facility], [place] [address], [place], [place] [address]  MRSA PCR Screening     Status: None   Collection Time: 02/26/18 12:53 PM  Result Value Ref Range Status   MRSA by PCR NEGATIVE NEGATIVE Final    Comment:        The GeneXpert MRSA Assay (FDA approved for NASAL specimens only), is one component of a comprehensive MRSA colonization surveillance program. It is not intended to diagnose MRSA infection nor to guide or monitor treatment for MRSA infections. Performed at [facility], [address] [place], [place] [address]     Radiology Reports Ct Abdomen Pelvis Wo Contrast  Result Date: 02/26/2018 CLINICAL DATA:  Sepsis. EXAM: CT ABDOMEN AND PELVIS WITHOUT CONTRAST TECHNIQUE: Multidetector CT imaging of the abdomen and pelvis was performed following the standard protocol without IV contrast. COMPARISON:  CT abdomen pelvis dated February 25, 2007. FINDINGS: Lower chest: Mild ground-glass density at the lung  bases, unchanged. Hepatobiliary: Hepatic steatosis. Prior cholecystectomy. No biliary dilatation. Pancreas: Unremarkable. No pancreatic ductal dilatation or surrounding inflammatory changes. Spleen: Normal in size without  focal abnormality. Adrenals/Urinary Tract: The adrenal glands are unremarkable. Interval increase in size of the 3.2 cm simple cyst arising from the upper pole of the left kidney. Punctate left renal calculi. No ureteral calculi or hydronephrosis. The bladder is unremarkable. Stomach/Bowel: Stomach is within normal limits. Appendix appears normal. No evidence of bowel wall thickening, distention, or inflammatory changes. There are few scattered colonic diverticula. Vascular/Lymphatic: Aortic atherosclerosis. No enlarged abdominal or pelvic lymph nodes. Reproductive: Prostate is unremarkable. Other: No free fluid or pneumoperitoneum. Musculoskeletal: No acute or significant osseous findings. Chronic mild compression deformity of T12. IMPRESSION: 1.  No acute intra-abdominal process. 2. Hepatic steatosis. 3. Punctate left nephrolithiasis. 4. Mild ground-glass density at the lung bases is unchanged and may reflect edema or infection. 5.  Aortic atherosclerosis (ICD10-I70.0).  Normal Electronically Signed   By: Titus Dubin M.D.   On: 02/26/2018 19:18   Dg Chest 2 View  Result Date: 02/24/2018 CLINICAL DATA:  Shortness of breath EXAM: CHEST - 2 VIEW COMPARISON:  February 19, 2018 chest radiograph and chest CT February 20, 2018 FINDINGS: There is no appreciable edema or consolidation. Heart is borderline enlarged with pulmonary vascular within normal limits. Patient is status post median sternotomy. There are surgical clips in the right upper thoracic region medially. There is no adenopathy appreciable by radiography. Adenopathy seen on recent CT is not appreciable by radiography. There is anterior wedging of T12 vertebral body, stable. IMPRESSION: Cardiac prominence is stable. No frank edema or consolidation. Areas of postoperative change noted. Electronically Signed   By: Lowella Grip III M.D.   On: 02/24/2018 10:04   Dg Chest 2 View  Result Date: 02/19/2018 CLINICAL DATA:  SOB for several days, legs swelling  EXAM: CHEST - 2 VIEW COMPARISON:  08/05/2017 FINDINGS: Status post median sternotomy. Heart is UPPER normal in size. There is mild perihilar vascular prominence. No overt edema. No focal consolidations or pleural effusions. There is wedge deformity of numerous LOWER thoracic and UPPER lumbar vertebral bodies, stable in appearance. IMPRESSION: 1. Cardiomegaly and mild edema. 2. 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 3.  Recommend DXA bone density exam if not previously performed. Electronically Signed   By: Nolon Nations M.D.   On: 02/19/2018 10:05   Ct Angio Chest Pe W/cm &/or Wo Cm  Result Date: 02/24/2018 CLINICAL DATA:  Shortness of breath fever EXAM: CT ANGIOGRAPHY CHEST WITH CONTRAST TECHNIQUE: Multidetector CT imaging of the chest was performed using the standard protocol during bolus administration of intravenous contrast. Multiplanar CT image reconstructions and MIPs were obtained to evaluate the vascular anatomy. CONTRAST:  75 mL Isovue 370 intravenous COMPARISON:  Chest x-ray 02/24/2018, CT chest 02/20/2018 FINDINGS: Cardiovascular: Limited opacification of pulmonary arterial system. No gross acute central filling defects are seen. Nonaneurysmal aorta. Mild aortic atherosclerosis. Coronary vascular calcification. No pericardial effusion. Mediastinum/Nodes: Midline trachea. Coarse calcification in the left lobe of the thyroid. Multiple enlarged mediastinal lymph nodes. Right paratracheal lymph node measures 13 mm. Left perihilar lymph node measures 18 mm. Right hilar lymph node measures 18 mm. Subcarinal lymph node nodes measure up to 2 cm. Esophagus within normal limits. Post CABG changes. Lungs/Pleura: Scattered areas of mild hazy density. No consolidation or pleural effusion. Upper Abdomen: Cyst upper pole left kidney.  No acute abnormality Musculoskeletal: No chest wall abnormality. No acute or significant  osseous findings. Sternotomy. Review of the MIP images  confirms the above findings. IMPRESSION: 1. Suboptimal contrast opacification which limits evaluation for pulmonary embolus. No gross central embolus is seen. 2. No focal pulmonary consolidations. Mild bilateral hazy lung densities which could be due to small airways disease or minimal edema. 3. Mediastinal and hilar adenopathy, possibly reactive but consider short interval CT follow-up in 3-6 months to exclude lymphoproliferative abnormality. Aortic Atherosclerosis (ICD10-I70.0). Electronically Signed   By: Donavan Foil M.D.   On: 02/24/2018 14:12   Ct Angio Chest Pe W And/or Wo Contrast  Result Date: 02/20/2018 CLINICAL DATA:  Shortness of breath for 1 month. EXAM: CT ANGIOGRAPHY CHEST WITH CONTRAST TECHNIQUE: Multidetector CT imaging of the chest was performed using the standard protocol during bolus administration of intravenous contrast. Multiplanar CT image reconstructions and MIPs were obtained to evaluate the vascular anatomy. CONTRAST:  14mL ISOVUE-370 IOPAMIDOL (ISOVUE-370) INJECTION 76% COMPARISON:  Abdominal CT 02/24/2017 FINDINGS: Cardiovascular: Satisfactory opacification of the pulmonary arteries to the segmental level. No evidence of pulmonary embolism. Normal heart size. No pericardial effusion. Extensive atherosclerotic calcification of the coronaries, status post CABG. No acute aortic finding. Mediastinum/Nodes: Symmetric gynecomastia. Right hemithyroidectomy. Subcentimeter nodule in the lower pole left thyroid. Right hilar adenopathy that is asymmetric. Sub-carinal adenopathy measuring 22 mm short axis. These were not seen on a 02/25/2007 abdominal CT which covered much of the lower mediastinum. Lungs/Pleura: There is no edema, consolidation, effusion, or pneumothorax. Mild fissure thickening and occasional interlobular septal thickening which is likely atelectatic. No definite edema. Upper Abdomen: Cholecystectomy. Left upper pole renal cystic density. Musculoskeletal: No acute or  aggressive finding. Review of the MIP images confirms the above findings. IMPRESSION: 1. Negative for pulmonary embolism or other acute finding. 2. Right hilar and subcarinal adenopathy not seen on a 2008 abdominal CT. No explainable findings on today's scan. If there is no explainable chronic lung disease or CHF on clinical grounds a follow-up CT could be obtained in 3-6 months. 3. Aortic Atherosclerosis (ICD10-I70.0). Electronically Signed   By: Monte Fantasia M.D.   On: 02/20/2018 09:26   Dg Chest Port 1 View  Result Date: 02/26/2018 CLINICAL DATA:  Hypoxia. EXAM: PORTABLE CHEST 1 VIEW COMPARISON:  Chest x-ray and CT chest dated February 24, 2018. FINDINGS: Stable cardiomegaly. Worsened bilateral perihilar alveolar and interstitial opacities, greater on the right. No pneumothorax or pleural effusion. No acute osseous abnormality. IMPRESSION: Worsening perihilar airspace disease, favored to reflect pulmonary edema. Infection could have a similar appearance. Electronically Signed   By: Titus Dubin M.D.   On: 02/26/2018 17:50    Time Spent in minutes  35   Glanda Spanbauer M.D on 02/27/2018 at 9:16 AM  Between 7am to 7pm - Pager - [phone]  After 7pm go to [email or web address] - password [facility]  [facility] -  Office  [phone]

## 2018-02-28 DIAGNOSIS — J181 Lobar pneumonia, unspecified organism: Secondary | ICD-10-CM

## 2018-02-28 LAB — CULTURE, BLOOD (ROUTINE X 2): Special Requests: ADEQUATE

## 2018-02-28 LAB — GLUCOSE, CAPILLARY
Glucose-Capillary: 328 mg/dL — ABNORMAL HIGH (ref 65–99)
Glucose-Capillary: 447 mg/dL — ABNORMAL HIGH (ref 65–99)
Glucose-Capillary: 429 mg/dL — ABNORMAL HIGH (ref 65–99)
Glucose-Capillary: 424 mg/dL — ABNORMAL HIGH (ref 65–99)

## 2018-02-28 LAB — LEGIONELLA PNEUMOPHILA SEROGP 1 UR AG: L. pneumophila Serogp 1 Ur Ag: NEGATIVE

## 2018-02-28 MED ORDER — INSULIN ASPART 100 UNIT/ML ~~LOC~~ SOLN
10.0000 [IU] | Freq: Once | SUBCUTANEOUS | Status: AC
  Administered 2018-02-28: 10 [IU] via SUBCUTANEOUS

## 2018-02-28 MED ORDER — LORAZEPAM 2 MG/ML IJ SOLN
0.5000 mg | Freq: Four times a day (QID) | INTRAMUSCULAR | Status: DC | PRN
  Administered 2018-02-28 (×2): 0.5 mg via INTRAVENOUS
  Filled 2018-02-28 (×2): qty 1

## 2018-02-28 MED ORDER — SODIUM CHLORIDE 0.9 % IV SOLN
2.0000 g | INTRAVENOUS | Status: DC
  Administered 2018-02-28 – 2018-03-01 (×2): 2 g via INTRAVENOUS
  Filled 2018-02-28: qty 20
  Filled 2018-02-28: qty 2
  Filled 2018-02-28: qty 20

## 2018-02-28 MED ORDER — INSULIN ASPART 100 UNIT/ML ~~LOC~~ SOLN
5.0000 [IU] | Freq: Three times a day (TID) | SUBCUTANEOUS | Status: DC
  Administered 2018-02-28: 5 [IU] via SUBCUTANEOUS
  Administered 2018-02-28: 8 [IU] via SUBCUTANEOUS

## 2018-02-28 MED ORDER — INSULIN GLARGINE 100 UNIT/ML ~~LOC~~ SOLN
25.0000 [IU] | Freq: Every day | SUBCUTANEOUS | Status: DC
  Administered 2018-02-28 – 2018-03-02 (×3): 25 [IU] via SUBCUTANEOUS
  Filled 2018-02-28 (×6): qty 0.25

## 2018-02-28 MED ORDER — INSULIN ASPART 100 UNIT/ML ~~LOC~~ SOLN
8.0000 [IU] | Freq: Three times a day (TID) | SUBCUTANEOUS | Status: DC
  Administered 2018-03-01 – 2018-03-03 (×8): 8 [IU] via SUBCUTANEOUS

## 2018-02-28 MED ORDER — INSULIN GLARGINE 100 UNIT/ML ~~LOC~~ SOLN
13.0000 [IU] | Freq: Every day | SUBCUTANEOUS | Status: DC
  Administered 2018-02-28: 13 [IU] via SUBCUTANEOUS
  Filled 2018-02-28 (×2): qty 0.13

## 2018-02-28 NOTE — Progress Notes (Signed)
Pt has called out several times stating he can't breathe. Bipap settings are normal, no leaking. RT called to go see pt-02 sats 97-99% RR 20-24. Dr. Manuella Ghazi paged to see if pt could get something for anxiety. Pt was receiving Ativan upstairs before being transferred. Waiting for orders/call back. Will continue to monitor pt

## 2018-02-28 NOTE — Progress Notes (Signed)
Subjective: He was admitted with shortness of breath and appears to have diffuse bilateral pneumonia on chest x-ray that I have personally reviewed.  He is requiring BiPAP.  He says he feels better.  He had some more shortness of breath last night but after his mask was adjusted that has improved.  Objective: Vital signs in last 24 hours: Temp:  [97.5 F (36.4 C)-98.7 F (37.1 C)] 97.5 F (36.4 C) (04/30 0400) Pulse Rate:  [66-92] 68 (04/30 0600) Resp:  [14-27] 17 (04/30 0600) BP: (122-147)/(58-70) 130/70 (04/30 0600) SpO2:  [91 %-99 %] 97 % (04/30 0833) FiO2 (%):  [40 %-45 %] 45 % (04/30 0154) Weight:  [127.6 kg (281 lb 4.9 oz)] 127.6 kg (281 lb 4.9 oz) (04/30 0500) Weight change: 0.1 kg (3.5 oz) Last BM Date: 02/27/18  Intake/Output from previous day: 04/29 0701 - 04/30 0700 In: 790 [P.O.:240; IV Piggyback:550] Out: 1450 [Urine:1450]  PHYSICAL EXAM General appearance: alert and On BiPAP Resp: He has diminished breath sounds with some fine rales Cardio: regular rate and rhythm, S1, S2 normal, no murmur, click, rub or gallop GI: soft, non-tender; bowel sounds normal; no masses,  no organomegaly Extremities: extremities normal, atraumatic, no cyanosis or edema  Lab Results:  Results for orders placed or performed during the hospital encounter of 02/24/18 (from the past 48 hour(s))  Glucose, capillary     Status: Abnormal   Collection Time: 02/26/18 11:55 AM  Result Value Ref Range   Glucose-Capillary 196 (H) 65 - 99 mg/dL  MRSA PCR Screening     Status: None   Collection Time: 02/26/18 12:53 PM  Result Value Ref Range   MRSA by PCR NEGATIVE NEGATIVE    Comment:        The GeneXpert MRSA Assay (FDA approved for NASAL specimens only), is one component of a comprehensive MRSA colonization surveillance program. It is not intended to diagnose MRSA infection nor to guide or monitor treatment for MRSA infections. Performed at [facility], [address] [place],  [place] [address]   Glucose, capillary     Status: Abnormal   Collection Time: 02/26/18  4:27 PM  Result Value Ref Range   Glucose-Capillary 159 (H) 65 - 99 mg/dL   Comment 1 Notify RN    Comment 2 Document in Chart   Lactic acid, plasma     Status: None   Collection Time: 02/26/18  5:31 PM  Result Value Ref Range   Lactic Acid, Venous 1.2 0.5 - 1.9 mmol/L    Comment: Performed at [facility], [address], [place], [place] [address]  Glucose, capillary     Status: Abnormal   Collection Time: 02/26/18  9:33 PM  Result Value Ref Range   Glucose-Capillary 173 (H) 65 - 99 mg/dL   Comment 1 Notify RN    Comment 2 Document in Chart   Lactic acid, plasma     Status: None   Collection Time: 02/26/18 11:07 PM  Result Value Ref Range   Lactic Acid, Venous 1.6 0.5 - 1.9 mmol/L    Comment: Performed at [facility], [address], [place], [place] [address]  Blood gas, arterial     Status: Abnormal   Collection Time: 02/27/18  4:30 AM  Result Value Ref Range   FIO2 30.00    Delivery systems BILEVEL POSITIVE AIRWAY PRESSURE    LHR 16 resp/min   Peep/cpap 5.0 cm H20   Inspiratory PAP 15    Expiratory PAP 5    pH,  Arterial 7.491 (H) 7.350 - 7.450   pCO2 arterial 29.4 (L) 32.0 - 48.0 mmHg   pO2, Arterial 57.0 (L) 83.0 - 108.0 mmHg   Bicarbonate 24.2 20.0 - 28.0 mmol/L   Acid-base deficit 0.7 0.0 - 2.0 mmol/L   O2 Saturation 90.7 %   Collection site LEFT BRACHIAL    Drawn by [id number]    Sample type ARTERIAL    Allens test (pass/fail) PASS PASS    Comment: Performed at [facility], [address], [place], [place] [address]  Basic metabolic panel     Status: Abnormal   Collection Time: 02/27/18  5:00 AM  Result Value Ref Range   Sodium 137 135 - 145 mmol/L   Potassium 4.4 3.5 - 5.1 mmol/L   Chloride 103 101 - 111 mmol/L   CO2 22 22 - 32 mmol/L   Glucose, Bld 206 (H) 65 - 99 mg/dL   BUN 22 (H) 6 - 20 mg/dL   Creatinine, Ser 1.69 (H) 0.61 - 1.24 mg/dL   Calcium 7.9 (L) 8.9 - 10.3 mg/dL    GFR calc non Af Amer 38 (L) >60 mL/min   GFR calc Af Amer 44 (L) >60 mL/min    Comment: (NOTE) The eGFR has been calculated using the CKD EPI equation. This calculation has not been validated in all clinical situations. eGFR's persistently <60 mL/min signify possible Chronic Kidney Disease.    Anion gap 12 5 - 15    Comment: Performed at [facility], [address] [place], [place] [address]  Glucose, capillary     Status: Abnormal   Collection Time: 02/27/18 12:46 PM  Result Value Ref Range   Glucose-Capillary 251 (H) 65 - 99 mg/dL   Comment 1 Notify RN    Comment 2 Document in Chart   Glucose, capillary     Status: Abnormal   Collection Time: 02/27/18  5:04 PM  Result Value Ref Range   Glucose-Capillary 245 (H) 65 - 99 mg/dL  Glucose, capillary     Status: Abnormal   Collection Time: 02/27/18  9:37 PM  Result Value Ref Range   Glucose-Capillary 304 (H) 65 - 99 mg/dL  Glucose, capillary     Status: Abnormal   Collection Time: 02/28/18  7:57 AM  Result Value Ref Range   Glucose-Capillary 328 (H) 65 - 99 mg/dL    ABGS Recent Labs    02/27/18 0430  PHART 7.491*  PO2ART 57.0*  HCO3 24.2   CULTURES Recent Results (from the past 240 hour(s))  Blood Culture (routine x 2)     Status: None   Collection Time: 02/20/18  7:59 AM  Result Value Ref Range Status   Specimen Description BLOOD LEFT FOREARM  Final   Special Requests   Final    BOTTLES DRAWN AEROBIC AND ANAEROBIC Blood Culture adequate volume   Culture   Final    NO GROWTH 5 DAYS Performed at [facility], [address], [place], [place] [address]    Report Status 02/25/2018 FINAL  Final  Blood Culture (routine x 2)     Status: None   Collection Time: 02/20/18  8:07 AM  Result Value Ref Range Status   Specimen Description BLOOD RIGHT ARM  Final   Special Requests   Final    BOTTLES DRAWN AEROBIC AND ANAEROBIC Blood Culture adequate volume   Culture   Final    NO GROWTH 5 DAYS Performed at [facility],  [address], [place], [place] [address]  Report Status 02/25/2018 FINAL  Final  Urine culture     Status: None   Collection Time: 02/24/18 10:47 AM  Result Value Ref Range Status   Specimen Description   Final    URINE, CLEAN CATCH Performed at [facility], [address], [place], [place] [address]    Special Requests   Final    NONE Performed at [facility], [address] [place], [place] [address]    Culture   Final    NO GROWTH Performed at [facility], [place] [address] [place], [place] [address]    Report Status 02/26/2018 FINAL  Final  Culture, blood (routine x 2)     Status: None (Preliminary result)   Collection Time: 02/24/18 11:03 AM  Result Value Ref Range Status   Specimen Description BLOOD BLOOD LEFT HAND  Final   Special Requests   Final    BOTTLES DRAWN AEROBIC AND ANAEROBIC Blood Culture adequate volume   Culture   Final    NO GROWTH 3 DAYS Performed at [facility], [address], [place], [place] [address]    Report Status PENDING  Incomplete  Culture, blood (routine x 2)     Status: Abnormal   Collection Time: 02/24/18 11:03 AM  Result Value Ref Range Status   Specimen Description   Final    BLOOD BLOOD LEFT HAND Performed at [facility], [address] [place], [place] [address]    Special Requests   Final    BOTTLES DRAWN AEROBIC AND ANAEROBIC Blood Culture adequate volume Performed at [facility], [address], [place], [place] [address]    Culture  Setup Time   Final    GRAM POSITIVE COCCI Gram Stain Report Called to,Read Back By and Verified With: DILDY @ 0802 ON [id number] BY HENDERSON L.  ANAEROBIC BOTTLE ONLY CRITICAL RESULT CALLED TO, READ BACK BY AND VERIFIED WITH: S HURTH,PHARMD AT 1152 02/26/18 BY L BENFIELD    Culture (A)  Final    MICROCOCCUS SPECIES Standardized susceptibility testing for this organism is not available. Performed at [facility], [place] [address], [place], [place] [address]    Report Status 02/28/2018 FINAL   Final  Blood Culture ID Panel (Reflexed)     Status: None   Collection Time: 02/24/18 11:03 AM  Result Value Ref Range Status   Enterococcus species NOT DETECTED NOT DETECTED Final   Listeria monocytogenes NOT DETECTED NOT DETECTED Final   Staphylococcus species NOT DETECTED NOT DETECTED Final   Staphylococcus aureus NOT DETECTED NOT DETECTED Final   Streptococcus species NOT DETECTED NOT DETECTED Final   Streptococcus agalactiae NOT DETECTED NOT DETECTED Final   Streptococcus pneumoniae NOT DETECTED NOT DETECTED Final   Streptococcus pyogenes NOT DETECTED NOT DETECTED Final   Acinetobacter baumannii NOT DETECTED NOT DETECTED Final   Enterobacteriaceae species NOT DETECTED NOT DETECTED Final   Enterobacter cloacae complex NOT DETECTED NOT DETECTED Final   Escherichia coli NOT DETECTED NOT DETECTED Final   Klebsiella oxytoca NOT DETECTED NOT DETECTED Final   Klebsiella pneumoniae NOT DETECTED NOT DETECTED Final   Proteus species NOT DETECTED NOT DETECTED Final   Serratia marcescens NOT DETECTED NOT DETECTED Final   Haemophilus influenzae NOT DETECTED NOT DETECTED Final   Neisseria meningitidis NOT DETECTED NOT DETECTED Final   Pseudomonas aeruginosa NOT DETECTED NOT DETECTED Final   Candida albicans NOT DETECTED NOT DETECTED Final   Candida glabrata NOT DETECTED NOT DETECTED Final   Candida krusei NOT DETECTED NOT  DETECTED Final   Candida parapsilosis NOT DETECTED NOT DETECTED Final   Candida tropicalis NOT DETECTED NOT DETECTED Final    Comment: Performed at [facility], [place] [address] [place], [place] [address]  Respiratory Panel by PCR     Status: None   Collection Time: 02/24/18 10:00 PM  Result Value Ref Range Status   Adenovirus NOT DETECTED NOT DETECTED Final   Coronavirus 229E NOT DETECTED NOT DETECTED Final   Coronavirus HKU1 NOT DETECTED NOT DETECTED Final   Coronavirus NL63 NOT DETECTED NOT DETECTED Final   Coronavirus OC43 NOT DETECTED NOT DETECTED Final    Metapneumovirus NOT DETECTED NOT DETECTED Final   Rhinovirus / Enterovirus NOT DETECTED NOT DETECTED Final   Influenza A NOT DETECTED NOT DETECTED Final   Influenza B NOT DETECTED NOT DETECTED Final   Parainfluenza Virus 1 NOT DETECTED NOT DETECTED Final   Parainfluenza Virus 2 NOT DETECTED NOT DETECTED Final   Parainfluenza Virus 3 NOT DETECTED NOT DETECTED Final   Parainfluenza Virus 4 NOT DETECTED NOT DETECTED Final   Respiratory Syncytial Virus NOT DETECTED NOT DETECTED Final   Bordetella pertussis NOT DETECTED NOT DETECTED Final   Chlamydophila pneumoniae NOT DETECTED NOT DETECTED Final   Mycoplasma pneumoniae NOT DETECTED NOT DETECTED Final    Comment: Performed at [facility], [place] [address] [place], [place] [address]  MRSA PCR Screening     Status: None   Collection Time: 02/26/18 12:53 PM  Result Value Ref Range Status   MRSA by PCR NEGATIVE NEGATIVE Final    Comment:        The GeneXpert MRSA Assay (FDA approved for NASAL specimens only), is one component of a comprehensive MRSA colonization surveillance program. It is not intended to diagnose MRSA infection nor to guide or monitor treatment for MRSA infections. Performed at [facility], [address] [place],  [address]    Studies/Results: Ct Abdomen Pelvis Wo Contrast  Result Date: 02/26/2018 CLINICAL DATA:  Sepsis. EXAM: CT ABDOMEN AND PELVIS WITHOUT CONTRAST TECHNIQUE: Multidetector CT imaging of the abdomen and pelvis was performed following the standard protocol without IV contrast. COMPARISON:  CT abdomen pelvis dated February 25, 2007. FINDINGS: Lower chest: Mild ground-glass density at the lung bases, unchanged. Hepatobiliary: Hepatic steatosis. Prior cholecystectomy. No biliary dilatation. Pancreas: Unremarkable. No pancreatic ductal dilatation or surrounding inflammatory changes. Spleen: Normal in size without focal abnormality. Adrenals/Urinary Tract: The adrenal glands are unremarkable. Interval  increase in size of the 3.2 cm simple cyst arising from the upper pole of the left kidney. Punctate left renal calculi. No ureteral calculi or hydronephrosis. The bladder is unremarkable. Stomach/Bowel: Stomach is within normal limits. Appendix appears normal. No evidence of bowel wall thickening, distention, or inflammatory changes. There are few scattered colonic diverticula. Vascular/Lymphatic: Aortic atherosclerosis. No enlarged abdominal or pelvic lymph nodes. Reproductive: Prostate is unremarkable. Other: No free fluid or pneumoperitoneum. Musculoskeletal: No acute or significant osseous findings. Chronic mild compression deformity of T12. IMPRESSION: 1.  No acute intra-abdominal process. 2. Hepatic steatosis. 3. Punctate left nephrolithiasis. 4. Mild ground-glass density at the lung bases is unchanged and may reflect edema or infection. 5.  Aortic atherosclerosis (ICD10-I70.0).  Normal Electronically Signed   By: Titus Dubin M.D.   On: 02/26/2018 19:18   Dg Chest Port 1 View  Result Date: 02/26/2018 CLINICAL DATA:  Hypoxia. EXAM: PORTABLE CHEST 1 VIEW COMPARISON:  Chest x-ray and CT chest dated February 24, 2018. FINDINGS: Stable cardiomegaly. Worsened bilateral perihilar alveolar  and interstitial opacities, greater on the right. No pneumothorax or pleural effusion. No acute osseous abnormality. IMPRESSION: Worsening perihilar airspace disease, favored to reflect pulmonary edema. Infection could have a similar appearance. Electronically Signed   By: Titus Dubin M.D.   On: 02/26/2018 17:50    Medications:  Prior to Admission:  Medications Prior to Admission  Medication Sig Dispense Refill Last Dose  . acetaminophen (TYLENOL) 650 MG CR tablet Take 1,300 mg by mouth every 8 (eight) hours as needed for pain.   02/23/2018 at Unknown time  . amLODipine (NORVASC) 10 MG tablet Take 10 mg by mouth daily.   02/23/2018 at Unknown time  . atorvastatin (LIPITOR) 80 MG tablet Take 40 mg by mouth every  evening.    02/23/2018 at Unknown time  . atropine 1 % ophthalmic solution Place 1 drop into the left eye daily.    02/23/2018 at Unknown time  . Brinzolamide-Brimonidine (SIMBRINZA) 1-0.2 % SUSP Apply 1 drop to eye 2 (two) times daily.   02/23/2018 at Unknown time  . carboxymethylcellulose (REFRESH PLUS) 0.5 % SOLN Place 1 drop into both eyes 4 (four) times daily as needed.   02/23/2018 at Unknown time  . dipyridamole-aspirin (AGGRENOX) 200-25 MG per 12 hr capsule Take 1 capsule by mouth 2 (two) times daily.   02/23/2018 at Unknown time  . folic acid (FOLVITE) 1 MG tablet Take 1 mg by mouth.   02/23/2018 at Unknown time  . furosemide (LASIX) 20 MG tablet Take 1 tablet (20 mg total) by mouth daily. 30 tablet 0 02/23/2018 at Unknown time  . insulin aspart protamine- aspart (NOVOLOG MIX 70/30) (70-30) 100 UNIT/ML injection Inject into the skin. Patient takes 40 units in the morning and 50 units at night.   02/24/2018 at Unknown time  . Ipratropium-Albuterol (COMBIVENT RESPIMAT) 20-100 MCG/ACT AERS respimat Inhale 1 puff into the lungs every 6 (six) hours.   02/23/2018 at Unknown time  . ketorolac (ACULAR) 0.5 % ophthalmic solution Place 1 drop into the right eye 4 (four) times daily.    02/23/2018 at Unknown time  . losartan (COZAAR) 100 MG tablet Take 100 mg by mouth daily.     02/23/2018 at Unknown time  . metFORMIN (GLUCOPHAGE) 1000 MG tablet Take 1,000 mg by mouth 2 (two) times daily with a meal.     02/24/2018 at Unknown time  . metoprolol (LOPRESSOR) 50 MG tablet Take 0.5 tablets (25 mg total) by mouth 2 (two) times daily. FOR HEART/BLOOD PRESSURE. HOLD FOR SYSTOLIC BLOOD PRESSURE LESS THAN 1OO/HEART RATE LESS THAN 60   02/23/2018 at 8:30am  . pioglitazone (ACTOS) 30 MG tablet Take 30 mg by mouth daily.    02/24/2018 at Unknown time  . potassium chloride (K-DUR,KLOR-CON) 10 MEQ tablet Take 2 tablets (20 mEq total) by mouth daily. Take with lasix 60 tablet 2 02/23/2018 at Unknown time  . spironolactone  (ALDACTONE) 25 MG tablet Take 25 mg by mouth daily.   02/23/2018 at Unknown time  . tamsulosin (FLOMAX) 0.4 MG CAPS capsule Take 2 capsules (0.8 mg total) by mouth every evening. 60 capsule 3 02/23/2018 at Unknown time  . timolol (BETIMOL) 0.5 % ophthalmic solution Place 1 drop into the left eye 2 (two) times daily.   02/23/2018 at Unknown time  . Adalimumab 80 MG/0.8ML & 40MG/0.4ML PNKT Inject 1 Dose into the skin every 14 (fourteen) days.   02/20/2018  . clotrimazole-betamethasone (LOTRISONE) cream Apply 1 application topically 2 (two) times daily. (Patient taking differently: Apply 1  application topically as needed. ) 30 g 1 unknown  . methotrexate (RHEUMATREX) 2.5 MG tablet Take 20 mg by mouth once a week.   02/17/2018   Scheduled: . atorvastatin  40 mg Oral QPM  . atropine  1 drop Left Eye Daily  . dipyridamole-aspirin  1 capsule Oral BID  . enoxaparin (LOVENOX) injection  60 mg Subcutaneous Q24H  . insulin aspart  0-15 Units Subcutaneous TID WC  . insulin aspart  0-5 Units Subcutaneous QHS  . insulin glargine  13 Units Subcutaneous Daily  . ipratropium-albuterol  3 mL Nebulization Q6H  . ketorolac  1 drop Right Eye QID  . methylPREDNISolone (SOLU-MEDROL) injection  40 mg Intravenous Q12H  . metoprolol tartrate  25 mg Oral BID  . potassium chloride  20 mEq Oral Daily  . spironolactone  25 mg Oral Daily  . tamsulosin  0.8 mg Oral QPM  . timolol  1 drop Left Eye BID   Continuous: . azithromycin Stopped (02/27/18 1814)  . piperacillin-tazobactam (ZOSYN)  IV Stopped (02/28/18 0933)  . vancomycin Stopped (02/27/18 1620)   GBT:[id number] **OR** acetaminophen, clotrimazole-betamethasone, LORazepam, polyvinyl alcohol, senna-docusate, sodium chloride flush  Assesment: He was admitted with sepsis with fever elevated lactic acid level.  Initially it was not clear what the source was but he has bilateral infiltrates which are diffuse on chest x-ray now.  He does have sleep apnea at  baseline.  He has acute hypoxic respiratory failure requiring BiPAP now.  He looks better this morning. Principal Problem:   Sepsis ([place]) Active Problems:   HTN (hypertension)   DM (diabetes mellitus) type II controlled with renal manifestation ([facility])   CAD (coronary artery disease)   Obesity, Class III, BMI 40-49.9 (morbid obesity) ([place])   History of stroke   OSA (obstructive sleep apnea)   Dyspnea   Lactic acidosis   Acute-on-chronic kidney injury ([place])    Plan: Continue BiPAP.  He is now on 45% oxygen so he is doing fairly well.  Continue IV antibiotics and steroids    LOS: 4 days   HAWKINS,EDWARD L 02/28/2018, 8:47 AM

## 2018-02-28 NOTE — Progress Notes (Signed)
Inpatient Diabetes Program Recommendations  AACE/ADA: New Consensus Statement on Inpatient Glycemic Control (2015)  Target Ranges:  Prepandial:   less than 140 mg/dL      Peak postprandial:   less than 180 mg/dL (1-2 hours)      Critically ill patients:  140 - 180 mg/dL   Results for [name], [name] (MRN [id number]) as of 02/28/2018 07:13  Ref. Range 02/26/2018 07:58 02/26/2018 11:55 02/26/2018 16:27 02/26/2018 21:33 02/27/2018 12:46 02/27/2018 17:04 02/27/2018 21:37  Glucose-Capillary Latest Ref Range: 65 - 99 mg/dL 118 (H) 196 (H) 159 (H) 173 (H) 251 (H) 245 (H) 304 (H)   Review of Glycemic Control  Diabetes history: DM2 Outpatient Diabetes medications: 70/30 40 units QAM, 70/30 50 units QHS, Metformin 1000 mg BID, Actos 30 mg daily Current orders for Inpatient glycemic control: Novolog 0-15 units TID with meals, Novolog 0-5 units QHS; Solumedrol 40 mg Q12H  Inpatient Diabetes Program Recommendations: Insulin - Basal: Please consider ordering Lantus 13 units Q24H (based on 127 kg x 0.1 units). Insulin - Meal Coverage: If steroids are continued, please consider ordering Novolog 4 units TID with meals for meal coverage if patient eats at least 50% of meals.  Thanks, Barnie Alderman, RN, MSN, CDE Diabetes Coordinator Inpatient Diabetes Program [phone] (Team Pager from 8am to 5pm)

## 2018-02-28 NOTE — Progress Notes (Signed)
PROGRESS NOTE                                                                                                                                                                                                             Patient Demographics:    [name], is a 77 y.o. male, DOB - 11-29-1940, SWF:[id number]  Admit date - 02/24/2018   Admitting Physician Truett Mainland, DO  Outpatient Primary MD for the patient is [facility], [place] Nunnery, MD  LOS - 4  Outpatient Specialists:  Chief Complaint  Patient presents with  . Shortness of Breath       Brief Narrative 77 year old male with morbid obesity, OSA, diabetes mellitus, hypertension, CAD status post CABG recently hospitalized from 4/22-4/23 with worsening dyspnea with work-up done including echo, CT angiogram of the chest, blood cultures and troponin which were unremarkable.  Patient was discharged home with the impression that this was related to OHS with recommendations to follow-up with PCP and obtain outpatient PFT. Patient reports that he was fine for a few days but had acute worsening of his symptoms with shortness of breath on minimal exertion.  At baseline he only walks about 10-15 feet limited by dyspnea.  Denies any wheezing but had some productive cough.  Denied any fevers at home. In the ED was septic with fever of 101.3 F, hypoxic in the high 80s on room air and a WBC of 10.8 (was normal on recent discharge).  He also had elevated lactic acid of 2.8, normal BNP, UA and chest x-ray without any infiltrate.  He had a repeat CT angiogram of the chest which was negative for PE with questionable bilateral hazy lung densities and some mediastinal hilar adenopathy.  Patient placed on empiric vancomycin and Zosyn for sepsis and given fluid bolus in the ED. Patient admitted for further management of sepsis.   Subjective:   Tolerated nasal cannula for some time last evening but  again had increased work of breathing and placed on BiPAP overnight.  Feels better this morning.  Remains afebrile.  Wants to get off BiPAP.   Assessment  & Plan :    Principal Problem:   Sepsis ([place]) Source unclear.  1/2 blood culture growing micrococcus.  Sepsis now resolved and remains afebrile.  Was on empiric vancomycin, Zosyn and azithromycin, will narrow down to Rocephin  and azithromycin.  Added empiric steroids. Follow urine for Legionella.  Viral panel negative.   CT angiogram of the chest, abdomen and pelvis (without contrast) were unremarkable except for groundglass opacity in the lower lobes.  If has further fevers will need MRI of the thoracic spine to rule out discitis (patient does complain of low back pain since several days prior to admission).  Active Problems: Acute respiratory failure with hypoxia (HCC) Increased work of breathing.  Unremarkable CT angiogram of the chest and 2D echo.  ABG with mild hypoxia but normal CO2. Has been on CPAP followed by BiPAP since admission. Narrow antibiotics.  Continue steroid.  Will wean off BiPAP and monitor on nasal cannula today.  Continue intermittent Lasix.    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  Avoid nephrotoxins.  Use Lasix as needed.  Holding ARB.  Obesity hypoventilation syndrome As above.  Currently on BiPAP.  Patient is on CPAP at home.  Needs outpatient PFT.   Essential hypertension Stable.  Resume metoprolol once off BiPAP.  Holding losartan. [name] CAD with history of CABG Continue metoprolol, holding losartan for AKI.    DM (diabetes mellitus) type II controlled with renal manifestation hypoglycemia (HCC) Was hypoglycemic while on CPAP/BiPAP and home dose insulin held.  Now CBG elevated to 300s after steroids started.  Will add 13 units Lantus and if taking p.o. we will add pre-meal coverage as well.  Anemia Check iron panel and B12.    Obesity, Class III, BMI 40-49.9 (morbid  obesity) (HCC)    History of stroke Continue statin.    Code Status : Full code  Family Communication  : Wife at bedside  Disposition Plan  : Possibly home once improved  Barriers For Discharge : Active symptoms  Consults  : Pulmonary  Procedures  : CT angiogram of the chest, CT abdomen  DVT Prophylaxis  :  Lovenox -   Lab Results  Component Value Date   PLT 349 02/25/2018    Antibiotics  :  Anti-infectives (From admission, onward)   Start     Dose/Rate Route Frequency Ordered Stop   02/25/18 1500  vancomycin (VANCOCIN) 1,500 mg in sodium chloride 0.9 % 500 mL IVPB     1,500 mg 250 mL/hr over 120 Minutes Intravenous Every 24 hours 02/25/18 0850     02/25/18 0930  piperacillin-tazobactam (ZOSYN) IVPB 3.375 g     3.375 g 12.5 mL/hr over 240 Minutes Intravenous Every 8 hours 02/25/18 0849     02/24/18 1630  azithromycin (ZITHROMAX) 500 mg in sodium chloride 0.9 % 250 mL IVPB     500 mg 250 mL/hr over 60 Minutes Intravenous Every 24 hours 02/24/18 1624     02/24/18 1500  vancomycin (VANCOCIN) 2,000 mg in sodium chloride 0.9 % 500 mL IVPB     2,000 mg 250 mL/hr over 120 Minutes Intravenous  Once 02/24/18 1443 02/24/18 1800   02/24/18 1445  piperacillin-tazobactam (ZOSYN) IVPB 3.375 g     3.375 g 100 mL/hr over 30 Minutes Intravenous  Once 02/24/18 1443 02/24/18 1630        Objective:   Vitals:   02/28/18 0800 02/28/18 0833 02/28/18 0900 02/28/18 1000  BP:   (!) 127/58 (!) 135/56  Pulse: 69  73 73  Resp: (!) 23  19 (!) 22  Temp: (!) 97.5 F (36.4 C)     TempSrc: Oral     SpO2:  97%    Weight:  Height:        Wt Readings from Last 3 Encounters:  02/28/18 127.6 kg (281 lb 4.9 oz)  02/20/18 127.2 kg (280 lb 6.8 oz)  02/19/18 132.5 kg (292 lb)     Intake/Output Summary (Last 24 hours) at 02/28/2018 1123 Last data filed at 02/28/2018 0533 Gross per 24 hour  Intake 840 ml  Output 1450 ml  Net -610 ml   Physical exam Morbidly obese male on BiPAP, not  in distress HEENT: On BiPAP Chest: Clear bilaterally CVS: Normal S1 and S2, no murmurs GI: Soft, nondistended, nontender Musculoskeletal: Warm, no edema       Data Review:    CBC Recent Labs  Lab 02/24/18 1103 02/25/18 0824  WBC 10.8* 11.4*  HGB 9.6* 9.5*  HCT 30.3* 30.6*  PLT 342 349  MCV 98.1 99.4  MCH 31.1 30.8  MCHC 31.7 31.0  RDW 15.5 15.7*  LYMPHSABS 2.1  --   MONOABS 1.3  --   EOSABS 0.1  --   BASOSABS 0.1  --     Chemistries  Recent Labs  Lab 02/24/18 1103 02/24/18 1451 02/25/18 0824 02/26/18 0835 02/27/18 0500  NA 137 137 136 139 137  K 4.2 3.9 4.7 4.3 4.4  CL 102 101 102 103 103  CO2 22 22 21* 24 22  GLUCOSE 74 51* 142* 116* 206*  BUN 25* 28* 19 21* 22*  CREATININE 1.49* 1.67* 1.40* 1.66* 1.69*  CALCIUM 8.9 8.7* 8.1* 8.2* 7.9*  AST  --  25  --   --   --   ALT  --  22  --   --   --   ALKPHOS  --  52  --   --   --   BILITOT  --  0.7  --   --   --    ------------------------------------------------------------------------------------------------------------------ No results for input(s): CHOL, HDL, LDLCALC, TRIG, CHOLHDL, LDLDIRECT in the last 72 hours.  Lab Results  Component Value Date   HGBA1C 7.0 (H) 02/20/2018   ------------------------------------------------------------------------------------------------------------------ No results for input(s): TSH, T4TOTAL, T3FREE, THYROIDAB in the last 72 hours.  Invalid input(s): FREET3 ------------------------------------------------------------------------------------------------------------------ No results for input(s): VITAMINB12, FOLATE, FERRITIN, TIBC, IRON, RETICCTPCT in the last 72 hours.  Coagulation profile No results for input(s): INR, PROTIME in the last 168 hours.  No results for input(s): DDIMER in the last 72 hours.  Cardiac Enzymes No results for input(s): CKMB, TROPONINI, MYOGLOBIN in the last 168 hours.  Invalid input(s):  CK ------------------------------------------------------------------------------------------------------------------    Component Value Date/Time   BNP 54.0 02/24/2018 1103   BNP 87 02/15/2018 1112    Inpatient Medications  Scheduled Meds: . atorvastatin  40 mg Oral QPM  . atropine  1 drop Left Eye Daily  . dipyridamole-aspirin  1 capsule Oral BID  . enoxaparin (LOVENOX) injection  60 mg Subcutaneous Q24H  . insulin aspart  0-15 Units Subcutaneous TID WC  . insulin aspart  0-5 Units Subcutaneous QHS  . insulin glargine  13 Units Subcutaneous Daily  . ipratropium-albuterol  3 mL Nebulization Q6H  . ketorolac  1 drop Right Eye QID  . methylPREDNISolone (SOLU-MEDROL) injection  40 mg Intravenous Q12H  . metoprolol tartrate  25 mg Oral BID  . potassium chloride  20 mEq Oral Daily  . spironolactone  25 mg Oral Daily  . tamsulosin  0.8 mg Oral QPM  . timolol  1 drop Left Eye BID   Continuous Infusions: . azithromycin Stopped (02/27/18 1814)  . piperacillin-tazobactam (ZOSYN)  IV Stopped (02/28/18 0933)  . vancomycin Stopped (02/27/18 1620)   PRN Meds:.acetaminophen **OR** acetaminophen, clotrimazole-betamethasone, LORazepam, polyvinyl alcohol, senna-docusate, sodium chloride flush  Micro Results Recent Results (from the past 240 hour(s))  Blood Culture (routine x 2)     Status: None   Collection Time: 02/20/18  7:59 AM  Result Value Ref Range Status   Specimen Description BLOOD LEFT FOREARM  Final   Special Requests   Final    BOTTLES DRAWN AEROBIC AND ANAEROBIC Blood Culture adequate volume   Culture   Final    NO GROWTH 5 DAYS Performed at [facility], [address], [place], [place] [address]    Report Status 02/25/2018 FINAL  Final  Blood Culture (routine x 2)     Status: None   Collection Time: 02/20/18  8:07 AM  Result Value Ref Range Status   Specimen Description BLOOD RIGHT ARM  Final   Special Requests   Final    BOTTLES DRAWN AEROBIC AND ANAEROBIC Blood  Culture adequate volume   Culture   Final    NO GROWTH 5 DAYS Performed at [facility], [address], [place], [place] [address]    Report Status 02/25/2018 FINAL  Final  Urine culture     Status: None   Collection Time: 02/24/18 10:47 AM  Result Value Ref Range Status   Specimen Description   Final    URINE, CLEAN CATCH Performed at [facility], [address] [place], [place] [address]    Special Requests   Final    NONE Performed at [facility], [address] [place], [place] [address]    Culture   Final    NO GROWTH Performed at [facility], [place] [address] [place], [place] [address]    Report Status 02/26/2018 FINAL  Final  Culture, blood (routine x 2)     Status: None (Preliminary result)   Collection Time: 02/24/18 11:03 AM  Result Value Ref Range Status   Specimen Description BLOOD BLOOD LEFT HAND  Final   Special Requests   Final    BOTTLES DRAWN AEROBIC AND ANAEROBIC Blood Culture adequate volume   Culture   Final    NO GROWTH 4 DAYS Performed at [facility], [address], [place], [place] [address]    Report Status PENDING  Incomplete  Culture, blood (routine x 2)     Status: Abnormal   Collection Time: 02/24/18 11:03 AM  Result Value Ref Range Status   Specimen Description   Final    BLOOD BLOOD LEFT HAND Performed at [facility], [address], [place], [place] [address]    Special Requests   Final    BOTTLES DRAWN AEROBIC AND ANAEROBIC Blood Culture adequate volume Performed at [facility], [address] [place], [place] [address]    Culture  Setup Time   Final    GRAM POSITIVE COCCI Gram Stain Report Called to,Read Back By and Verified With: DILDY @ 0802 ON [id number] BY HENDERSON L.  ANAEROBIC BOTTLE ONLY CRITICAL RESULT CALLED TO, READ BACK BY AND VERIFIED WITH: S HURTH,PHARMD AT 1152 02/26/18 BY L BENFIELD    Culture (A)  Final    MICROCOCCUS SPECIES Standardized susceptibility testing for this organism is not available. Performed at  [facility], [employer] [address], [place], [place] [address]    Report Status 02/28/2018 FINAL  Final  Blood Culture ID Panel (Reflexed)     Status: None   Collection Time: 02/24/18 11:03 AM  Result Value Ref Range Status   Enterococcus species NOT DETECTED NOT DETECTED Final   Listeria monocytogenes NOT DETECTED NOT DETECTED Final   Staphylococcus species NOT DETECTED NOT DETECTED Final   Staphylococcus aureus NOT DETECTED NOT DETECTED Final   Streptococcus species NOT DETECTED NOT DETECTED Final   Streptococcus agalactiae NOT DETECTED NOT DETECTED Final   Streptococcus pneumoniae NOT DETECTED NOT DETECTED Final   Streptococcus pyogenes NOT DETECTED NOT DETECTED Final   Acinetobacter baumannii NOT DETECTED NOT DETECTED Final   Enterobacteriaceae species NOT DETECTED NOT DETECTED Final   Enterobacter cloacae complex NOT DETECTED NOT DETECTED Final   Escherichia coli NOT DETECTED NOT DETECTED Final   Klebsiella oxytoca NOT DETECTED NOT DETECTED Final   Klebsiella pneumoniae NOT DETECTED NOT DETECTED Final   Proteus species NOT DETECTED NOT DETECTED Final   Serratia marcescens NOT DETECTED NOT DETECTED Final   Haemophilus influenzae NOT DETECTED NOT DETECTED Final   Neisseria meningitidis NOT DETECTED NOT DETECTED Final   Pseudomonas aeruginosa NOT DETECTED NOT DETECTED Final   Candida albicans NOT DETECTED NOT DETECTED Final   Candida glabrata NOT DETECTED NOT DETECTED Final   Candida krusei NOT DETECTED NOT DETECTED Final   Candida parapsilosis NOT DETECTED NOT DETECTED Final   Candida tropicalis NOT DETECTED NOT DETECTED Final    Comment: Performed at [facility], [address] [place], [place] [address]  Respiratory Panel by PCR     Status: None   Collection Time: 02/24/18 10:00 PM  Result Value Ref Range Status   Adenovirus NOT DETECTED NOT DETECTED Final   Coronavirus 229E NOT DETECTED NOT DETECTED Final   Coronavirus HKU1 NOT DETECTED NOT DETECTED Final    Coronavirus NL63 NOT DETECTED NOT DETECTED Final   Coronavirus OC43 NOT DETECTED NOT DETECTED Final   Metapneumovirus NOT DETECTED NOT DETECTED Final   Rhinovirus / Enterovirus NOT DETECTED NOT DETECTED Final   Influenza A NOT DETECTED NOT DETECTED Final   Influenza B NOT DETECTED NOT DETECTED Final   Parainfluenza Virus 1 NOT DETECTED NOT DETECTED Final   Parainfluenza Virus 2 NOT DETECTED NOT DETECTED Final   Parainfluenza Virus 3 NOT DETECTED NOT DETECTED Final   Parainfluenza Virus 4 NOT DETECTED NOT DETECTED Final   Respiratory Syncytial Virus NOT DETECTED NOT DETECTED Final   Bordetella pertussis NOT DETECTED NOT DETECTED Final   Chlamydophila pneumoniae NOT DETECTED NOT DETECTED Final   Mycoplasma pneumoniae NOT DETECTED NOT DETECTED Final    Comment: Performed at [facility], [place] [address], [place], [place] [address]  MRSA PCR Screening     Status: None   Collection Time: 02/26/18 12:53 PM  Result Value Ref Range Status   MRSA by PCR NEGATIVE NEGATIVE Final    Comment:        The GeneXpert MRSA Assay (FDA approved for NASAL specimens only), is one component of a comprehensive MRSA colonization surveillance program. It is not intended to diagnose MRSA infection nor to guide or monitor treatment for MRSA infections. Performed at [facility], [address], [place], [place] [address]     Radiology Reports Ct Abdomen Pelvis Wo Contrast  Result Date: 02/26/2018 CLINICAL DATA:  Sepsis. EXAM: CT ABDOMEN AND PELVIS WITHOUT CONTRAST TECHNIQUE: Multidetector CT imaging of the abdomen and pelvis was performed following the standard protocol without IV contrast. COMPARISON:  CT abdomen pelvis dated February 25, 2007. FINDINGS: Lower chest: Mild ground-glass density at the lung bases, unchanged. Hepatobiliary: Hepatic steatosis. Prior cholecystectomy. No biliary dilatation.  Pancreas: Unremarkable. No pancreatic ductal dilatation or surrounding inflammatory changes. Spleen: Normal  in size without focal abnormality. Adrenals/Urinary Tract: The adrenal glands are unremarkable. Interval increase in size of the 3.2 cm simple cyst arising from the upper pole of the left kidney. Punctate left renal calculi. No ureteral calculi or hydronephrosis. The bladder is unremarkable. Stomach/Bowel: Stomach is within normal limits. Appendix appears normal. No evidence of bowel wall thickening, distention, or inflammatory changes. There are few scattered colonic diverticula. Vascular/Lymphatic: Aortic atherosclerosis. No enlarged abdominal or pelvic lymph nodes. Reproductive: Prostate is unremarkable. Other: No free fluid or pneumoperitoneum. Musculoskeletal: No acute or significant osseous findings. Chronic mild compression deformity of T12. IMPRESSION: 1.  No acute intra-abdominal process. 2. Hepatic steatosis. 3. Punctate left nephrolithiasis. 4. Mild ground-glass density at the lung bases is unchanged and may reflect edema or infection. 5.  Aortic atherosclerosis (ICD10-I70.0).  Normal Electronically Signed   By: Titus Dubin M.D.   On: 02/26/2018 19:18   Dg Chest 2 View  Result Date: 02/24/2018 CLINICAL DATA:  Shortness of breath EXAM: CHEST - 2 VIEW COMPARISON:  February 19, 2018 chest radiograph and chest CT February 20, 2018 FINDINGS: There is no appreciable edema or consolidation. Heart is borderline enlarged with pulmonary vascular within normal limits. Patient is status post median sternotomy. There are surgical clips in the right upper thoracic region medially. There is no adenopathy appreciable by radiography. Adenopathy seen on recent CT is not appreciable by radiography. There is anterior wedging of T12 vertebral body, stable. IMPRESSION: Cardiac prominence is stable. No frank edema or consolidation. Areas of postoperative change noted. Electronically Signed   By: Lowella Grip III M.D.   On: 02/24/2018 10:04   Dg Chest 2 View  Result Date: 02/19/2018 CLINICAL DATA:  SOB for several  days, legs swelling EXAM: CHEST - 2 VIEW COMPARISON:  08/05/2017 FINDINGS: Status post median sternotomy. Heart is UPPER normal in size. There is mild perihilar vascular prominence. No overt edema. No focal consolidations or pleural effusions. There is wedge deformity of numerous LOWER thoracic and UPPER lumbar vertebral bodies, stable in appearance. IMPRESSION: 1. Cardiomegaly and mild edema. 2. 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 3.  Recommend DXA bone density exam if not previously performed. Electronically Signed   By: Nolon Nations M.D.   On: 02/19/2018 10:05   Ct Angio Chest Pe W/cm &/or Wo Cm  Result Date: 02/24/2018 CLINICAL DATA:  Shortness of breath fever EXAM: CT ANGIOGRAPHY CHEST WITH CONTRAST TECHNIQUE: Multidetector CT imaging of the chest was performed using the standard protocol during bolus administration of intravenous contrast. Multiplanar CT image reconstructions and MIPs were obtained to evaluate the vascular anatomy. CONTRAST:  75 mL Isovue 370 intravenous COMPARISON:  Chest x-ray 02/24/2018, CT chest 02/20/2018 FINDINGS: Cardiovascular: Limited opacification of pulmonary arterial system. No gross acute central filling defects are seen. Nonaneurysmal aorta. Mild aortic atherosclerosis. Coronary vascular calcification. No pericardial effusion. Mediastinum/Nodes: Midline trachea. Coarse calcification in the left lobe of the thyroid. Multiple enlarged mediastinal lymph nodes. Right paratracheal lymph node measures 13 mm. Left perihilar lymph node measures 18 mm. Right hilar lymph node measures 18 mm. Subcarinal lymph node nodes measure up to 2 cm. Esophagus within normal limits. Post CABG changes. Lungs/Pleura: Scattered areas of mild hazy density. No consolidation or pleural effusion. Upper Abdomen: Cyst upper pole left kidney.  No acute abnormality Musculoskeletal: No chest wall abnormality. No acute or significant osseous findings. Sternotomy. Review of  the MIP images confirms  the above findings. IMPRESSION: 1. Suboptimal contrast opacification which limits evaluation for pulmonary embolus. No gross central embolus is seen. 2. No focal pulmonary consolidations. Mild bilateral hazy lung densities which could be due to small airways disease or minimal edema. 3. Mediastinal and hilar adenopathy, possibly reactive but consider short interval CT follow-up in 3-6 months to exclude lymphoproliferative abnormality. Aortic Atherosclerosis (ICD10-I70.0). Electronically Signed   By: Donavan Foil M.D.   On: 02/24/2018 14:12   Ct Angio Chest Pe W And/or Wo Contrast  Result Date: 02/20/2018 CLINICAL DATA:  Shortness of breath for 1 month. EXAM: CT ANGIOGRAPHY CHEST WITH CONTRAST TECHNIQUE: Multidetector CT imaging of the chest was performed using the standard protocol during bolus administration of intravenous contrast. Multiplanar CT image reconstructions and MIPs were obtained to evaluate the vascular anatomy. CONTRAST:  169mL ISOVUE-370 IOPAMIDOL (ISOVUE-370) INJECTION 76% COMPARISON:  Abdominal CT 02/24/2017 FINDINGS: Cardiovascular: Satisfactory opacification of the pulmonary arteries to the segmental level. No evidence of pulmonary embolism. Normal heart size. No pericardial effusion. Extensive atherosclerotic calcification of the coronaries, status post CABG. No acute aortic finding. Mediastinum/Nodes: Symmetric gynecomastia. Right hemithyroidectomy. Subcentimeter nodule in the lower pole left thyroid. Right hilar adenopathy that is asymmetric. Sub-carinal adenopathy measuring 22 mm short axis. These were not seen on a 02/25/2007 abdominal CT which covered much of the lower mediastinum. Lungs/Pleura: There is no edema, consolidation, effusion, or pneumothorax. Mild fissure thickening and occasional interlobular septal thickening which is likely atelectatic. No definite edema. Upper Abdomen: Cholecystectomy. Left upper pole renal cystic density. Musculoskeletal: No  acute or aggressive finding. Review of the MIP images confirms the above findings. IMPRESSION: 1. Negative for pulmonary embolism or other acute finding. 2. Right hilar and subcarinal adenopathy not seen on a 2008 abdominal CT. No explainable findings on today's scan. If there is no explainable chronic lung disease or CHF on clinical grounds a follow-up CT could be obtained in 3-6 months. 3. Aortic Atherosclerosis (ICD10-I70.0). Electronically Signed   By: Monte Fantasia M.D.   On: 02/20/2018 09:26   Dg Chest Port 1 View  Result Date: 02/26/2018 CLINICAL DATA:  Hypoxia. EXAM: PORTABLE CHEST 1 VIEW COMPARISON:  Chest x-ray and CT chest dated February 24, 2018. FINDINGS: Stable cardiomegaly. Worsened bilateral perihilar alveolar and interstitial opacities, greater on the right. No pneumothorax or pleural effusion. No acute osseous abnormality. IMPRESSION: Worsening perihilar airspace disease, favored to reflect pulmonary edema. Infection could have a similar appearance. Electronically Signed   By: Titus Dubin M.D.   On: 02/26/2018 17:50    Time Spent in minutes  35   Donyae Kilner M.D on 02/28/2018 at 11:23 AM  Between 7am to 7pm - Pager - [phone]  After 7pm go to [email or web address] - password [facility]  [facility] -  Office  [phone]

## 2018-03-01 ENCOUNTER — Inpatient Hospital Stay: Payer: Self-pay | Admitting: Family Medicine

## 2018-03-01 ENCOUNTER — Inpatient Hospital Stay (HOSPITAL_COMMUNITY): Payer: Non-veteran care

## 2018-03-01 DIAGNOSIS — E1122 Type 2 diabetes mellitus with diabetic chronic kidney disease: Secondary | ICD-10-CM

## 2018-03-01 DIAGNOSIS — I10 Essential (primary) hypertension: Secondary | ICD-10-CM

## 2018-03-01 DIAGNOSIS — G4733 Obstructive sleep apnea (adult) (pediatric): Secondary | ICD-10-CM

## 2018-03-01 DIAGNOSIS — N170 Acute kidney failure with tubular necrosis: Secondary | ICD-10-CM

## 2018-03-01 DIAGNOSIS — Z794 Long term (current) use of insulin: Secondary | ICD-10-CM

## 2018-03-01 DIAGNOSIS — N183 Chronic kidney disease, stage 3 (moderate): Secondary | ICD-10-CM

## 2018-03-01 DIAGNOSIS — A419 Sepsis, unspecified organism: Principal | ICD-10-CM

## 2018-03-01 LAB — CULTURE, BLOOD (ROUTINE X 2)
Culture: NO GROWTH
Special Requests: ADEQUATE

## 2018-03-01 LAB — GLUCOSE, CAPILLARY
Glucose-Capillary: 250 mg/dL — ABNORMAL HIGH (ref 65–99)
Glucose-Capillary: 325 mg/dL — ABNORMAL HIGH (ref 65–99)
Glucose-Capillary: 324 mg/dL — ABNORMAL HIGH (ref 65–99)
Glucose-Capillary: 217 mg/dL — ABNORMAL HIGH (ref 65–99)

## 2018-03-01 LAB — IRON AND TIBC
Iron: 74 ug/dL (ref 45–182)
Saturation Ratios: 57 % — ABNORMAL HIGH (ref 17.9–39.5)
TIBC: 129 ug/dL — ABNORMAL LOW (ref 250–450)
UIBC: 55 ug/dL

## 2018-03-01 LAB — BASIC METABOLIC PANEL
Anion gap: 10 (ref 5–15)
BUN: 26 mg/dL — ABNORMAL HIGH (ref 6–20)
CO2: 22 mmol/L (ref 22–32)
Calcium: 8 mg/dL — ABNORMAL LOW (ref 8.9–10.3)
Chloride: 107 mmol/L (ref 101–111)
Creatinine, Ser: 1.29 mg/dL — ABNORMAL HIGH (ref 0.61–1.24)
GFR calc Af Amer: >60 mL/min (ref >60)
GFR calc non Af Amer: 53 mL/min — ABNORMAL LOW (ref >60)
Glucose, Bld: 275 mg/dL — ABNORMAL HIGH (ref 65–99)
Potassium: 4.7 mmol/L (ref 3.5–5.1)
Sodium: 139 mmol/L (ref 135–145)

## 2018-03-01 LAB — VITAMIN B12: Vitamin B-12: 168 pg/mL — ABNORMAL LOW (ref 180–914)

## 2018-03-01 MED ORDER — FUROSEMIDE 20 MG PO TABS
20.0000 mg | ORAL_TABLET | Freq: Every day | ORAL | Status: DC
  Administered 2018-03-01 – 2018-03-03 (×3): 20 mg via ORAL
  Filled 2018-03-01 (×3): qty 1

## 2018-03-01 MED ORDER — DOXYCYCLINE HYCLATE 100 MG PO TABS
100.0000 mg | ORAL_TABLET | Freq: Two times a day (BID) | ORAL | Status: DC
  Administered 2018-03-01 – 2018-03-03 (×4): 100 mg via ORAL
  Filled 2018-03-01 (×4): qty 1

## 2018-03-01 NOTE — Care Management Important Message (Signed)
Important Message  Patient Details  Name: [name] MRN: [id number] Date of Birth: November 17, 1940   Medicare Important Message Given:  Yes    Shelda Altes 03/01/2018, 11:08 AM

## 2018-03-01 NOTE — Progress Notes (Signed)
Subjective: He feels much better today.  He is on nasal cannula at 4 L with oxygen saturation in the mid 90s.  He has no new complaints.  He is coughing up some sputum.  He used BiPAP again last night but he does have sleep apnea at baseline.  Objective: Vital signs in last 24 hours: Temp:  [97.5 F (36.4 C)-98.6 F (37 C)] 97.6 F (36.4 C) (05/01 0732) Pulse Rate:  [62-88] 62 (05/01 0732) Resp:  [16-24] 16 (05/01 0400) BP: (111-136)/(52-91) 124/69 (05/01 0700) SpO2:  [95 %-100 %] 100 % (05/01 0237) FiO2 (%):  [40 %] 40 % (05/01 0237) Weight:  [126.9 kg (279 lb 12.2 oz)] 126.9 kg (279 lb 12.2 oz) (05/01 0500) Weight change: -0.7 kg (-1 lb 8.7 oz) Last BM Date: 02/28/18  Intake/Output from previous day: 04/30 0701 - 05/01 0700 In: 100 [IV Piggyback:100] Out: 350 [Urine:350]  PHYSICAL EXAM General appearance: alert, cooperative and no distress Resp: rales bilaterally Cardio: regular rate and rhythm, S1, S2 normal, no murmur, click, rub or gallop GI: soft, non-tender; bowel sounds normal; no masses,  no organomegaly Extremities: extremities normal, atraumatic, no cyanosis or edema  Lab Results:  Results for orders placed or performed during the hospital encounter of 02/24/18 (from the past 48 hour(s))  Glucose, capillary     Status: Abnormal   Collection Time: 02/27/18 12:46 PM  Result Value Ref Range   Glucose-Capillary 251 (H) 65 - 99 mg/dL   Comment 1 Notify RN    Comment 2 Document in Chart   Glucose, capillary     Status: Abnormal   Collection Time: 02/27/18  5:04 PM  Result Value Ref Range   Glucose-Capillary 245 (H) 65 - 99 mg/dL  Glucose, capillary     Status: Abnormal   Collection Time: 02/27/18  9:37 PM  Result Value Ref Range   Glucose-Capillary 304 (H) 65 - 99 mg/dL  Glucose, capillary     Status: Abnormal   Collection Time: 02/28/18  7:57 AM  Result Value Ref Range   Glucose-Capillary 328 (H) 65 - 99 mg/dL  Glucose, capillary     Status: Abnormal   Collection Time: 02/28/18 11:34 AM  Result Value Ref Range   Glucose-Capillary 447 (H) 65 - 99 mg/dL  Glucose, capillary     Status: Abnormal   Collection Time: 02/28/18  4:56 PM  Result Value Ref Range   Glucose-Capillary 429 (H) 65 - 99 mg/dL  Glucose, capillary     Status: Abnormal   Collection Time: 02/28/18  9:34 PM  Result Value Ref Range   Glucose-Capillary 424 (H) 65 - 99 mg/dL  Basic metabolic panel     Status: Abnormal   Collection Time: 03/01/18  4:35 AM  Result Value Ref Range   Sodium 139 135 - 145 mmol/L   Potassium 4.7 3.5 - 5.1 mmol/L   Chloride 107 101 - 111 mmol/L   CO2 22 22 - 32 mmol/L   Glucose, Bld 275 (H) 65 - 99 mg/dL   BUN 26 (H) 6 - 20 mg/dL   Creatinine, Ser 1.29 (H) 0.61 - 1.24 mg/dL   Calcium 8.0 (L) 8.9 - 10.3 mg/dL   GFR calc non Af Amer 53 (L) >60 mL/min   GFR calc Af Amer >60 >60 mL/min    Comment: (NOTE) The eGFR has been calculated using the CKD EPI equation. This calculation has not been validated in all clinical situations. eGFR's persistently <60 mL/min signify possible Chronic Kidney Disease.  Anion gap 10 5 - 15    Comment: Performed at [facility], [address], [place], [place] [address]    ABGS Recent Labs    02/27/18 0430  PHART 7.491*  PO2ART 57.0*  HCO3 24.2   CULTURES Recent Results (from the past 240 hour(s))  Blood Culture (routine x 2)     Status: None   Collection Time: 02/20/18  7:59 AM  Result Value Ref Range Status   Specimen Description BLOOD LEFT FOREARM  Final   Special Requests   Final    BOTTLES DRAWN AEROBIC AND ANAEROBIC Blood Culture adequate volume   Culture   Final    NO GROWTH 5 DAYS Performed at [facility], [address] [place], [place] [address]    Report Status 02/25/2018 FINAL  Final  Blood Culture (routine x 2)     Status: None   Collection Time: 02/20/18  8:07 AM  Result Value Ref Range Status   Specimen Description BLOOD RIGHT ARM  Final   Special Requests   Final    BOTTLES  DRAWN AEROBIC AND ANAEROBIC Blood Culture adequate volume   Culture   Final    NO GROWTH 5 DAYS Performed at [facility], [address] [place], [place] [address]    Report Status 02/25/2018 FINAL  Final  Urine culture     Status: None   Collection Time: 02/24/18 10:47 AM  Result Value Ref Range Status   Specimen Description   Final    URINE, CLEAN CATCH Performed at [facility], [address], [place], [place] [address]    Special Requests   Final    NONE Performed at [facility], [address] [place], [place] [address]    Culture   Final    NO GROWTH Performed at [facility], [place] [address], [place], [place] [address]    Report Status 02/26/2018 FINAL  Final  Culture, blood (routine x 2)     Status: None   Collection Time: 02/24/18 11:03 AM  Result Value Ref Range Status   Specimen Description BLOOD BLOOD LEFT HAND  Final   Special Requests   Final    BOTTLES DRAWN AEROBIC AND ANAEROBIC Blood Culture adequate volume   Culture   Final    NO GROWTH 5 DAYS Performed at [facility], [address], [place], [place] [address]    Report Status 03/01/2018 FINAL  Final  Culture, blood (routine x 2)     Status: Abnormal   Collection Time: 02/24/18 11:03 AM  Result Value Ref Range Status   Specimen Description   Final    BLOOD BLOOD LEFT HAND Performed at [facility], [address] [place], [place] [address]    Special Requests   Final    BOTTLES DRAWN AEROBIC AND ANAEROBIC Blood Culture adequate volume Performed at [facility], [address], [place], [place] [address]    Culture  Setup Time   Final    GRAM POSITIVE COCCI Gram Stain Report Called to,Read Back By and Verified With: DILDY @ 0802 ON [id number] BY HENDERSON L.  ANAEROBIC BOTTLE ONLY CRITICAL RESULT CALLED TO, READ BACK BY AND VERIFIED WITH: S HURTH,PHARMD AT 1152 02/26/18 BY L BENFIELD    Culture (A)  Final    MICROCOCCUS SPECIES Standardized susceptibility testing for this organism is not  available. Performed at [facility], [place] [address] [place],  [address]    Report Status 02/28/2018 FINAL  Final  Blood Culture  ID Panel (Reflexed)     Status: None   Collection Time: 02/24/18 11:03 AM  Result Value Ref Range Status   Enterococcus species NOT DETECTED NOT DETECTED Final   Listeria monocytogenes NOT DETECTED NOT DETECTED Final   Staphylococcus species NOT DETECTED NOT DETECTED Final   Staphylococcus aureus NOT DETECTED NOT DETECTED Final   Streptococcus species NOT DETECTED NOT DETECTED Final   Streptococcus agalactiae NOT DETECTED NOT DETECTED Final   Streptococcus pneumoniae NOT DETECTED NOT DETECTED Final   Streptococcus pyogenes NOT DETECTED NOT DETECTED Final   Acinetobacter baumannii NOT DETECTED NOT DETECTED Final   Enterobacteriaceae species NOT DETECTED NOT DETECTED Final   Enterobacter cloacae complex NOT DETECTED NOT DETECTED Final   Escherichia coli NOT DETECTED NOT DETECTED Final   Klebsiella oxytoca NOT DETECTED NOT DETECTED Final   Klebsiella pneumoniae NOT DETECTED NOT DETECTED Final   Proteus species NOT DETECTED NOT DETECTED Final   Serratia marcescens NOT DETECTED NOT DETECTED Final   Haemophilus influenzae NOT DETECTED NOT DETECTED Final   Neisseria meningitidis NOT DETECTED NOT DETECTED Final   Pseudomonas aeruginosa NOT DETECTED NOT DETECTED Final   Candida albicans NOT DETECTED NOT DETECTED Final   Candida glabrata NOT DETECTED NOT DETECTED Final   Candida krusei NOT DETECTED NOT DETECTED Final   Candida parapsilosis NOT DETECTED NOT DETECTED Final   Candida tropicalis NOT DETECTED NOT DETECTED Final    Comment: Performed at [facility], [place] [address] [place], [place] [address]  Respiratory Panel by PCR     Status: None   Collection Time: 02/24/18 10:00 PM  Result Value Ref Range Status   Adenovirus NOT DETECTED NOT DETECTED Final   Coronavirus 229E NOT DETECTED NOT DETECTED Final   Coronavirus HKU1 NOT DETECTED NOT  DETECTED Final   Coronavirus NL63 NOT DETECTED NOT DETECTED Final   Coronavirus OC43 NOT DETECTED NOT DETECTED Final   Metapneumovirus NOT DETECTED NOT DETECTED Final   Rhinovirus / Enterovirus NOT DETECTED NOT DETECTED Final   Influenza A NOT DETECTED NOT DETECTED Final   Influenza B NOT DETECTED NOT DETECTED Final   Parainfluenza Virus 1 NOT DETECTED NOT DETECTED Final   Parainfluenza Virus 2 NOT DETECTED NOT DETECTED Final   Parainfluenza Virus 3 NOT DETECTED NOT DETECTED Final   Parainfluenza Virus 4 NOT DETECTED NOT DETECTED Final   Respiratory Syncytial Virus NOT DETECTED NOT DETECTED Final   Bordetella pertussis NOT DETECTED NOT DETECTED Final   Chlamydophila pneumoniae NOT DETECTED NOT DETECTED Final   Mycoplasma pneumoniae NOT DETECTED NOT DETECTED Final    Comment: Performed at [facility], [place] [address] [place], [place] [address]  MRSA PCR Screening     Status: None   Collection Time: 02/26/18 12:53 PM  Result Value Ref Range Status   MRSA by PCR NEGATIVE NEGATIVE Final    Comment:        The GeneXpert MRSA Assay (FDA approved for NASAL specimens only), is one component of a comprehensive MRSA colonization surveillance program. It is not intended to diagnose MRSA infection nor to guide or monitor treatment for MRSA infections. Performed at [facility], [address], [place], [place] [address]    Studies/Results: No results found.  Medications:  Prior to Admission:  Medications Prior to Admission  Medication Sig Dispense Refill Last Dose  . acetaminophen (TYLENOL) 650 MG CR tablet Take 1,300 mg by mouth every 8 (eight) hours as needed for pain.   02/23/2018 at Unknown time  . amLODipine (  NORVASC) 10 MG tablet Take 10 mg by mouth daily.   02/23/2018 at Unknown time  . atorvastatin (LIPITOR) 80 MG tablet Take 40 mg by mouth every evening.    02/23/2018 at Unknown time  . atropine 1 % ophthalmic solution Place 1 drop into the left eye daily.    02/23/2018 at  Unknown time  . Brinzolamide-Brimonidine (SIMBRINZA) 1-0.2 % SUSP Apply 1 drop to eye 2 (two) times daily.   02/23/2018 at Unknown time  . carboxymethylcellulose (REFRESH PLUS) 0.5 % SOLN Place 1 drop into both eyes 4 (four) times daily as needed.   02/23/2018 at Unknown time  . dipyridamole-aspirin (AGGRENOX) 200-25 MG per 12 hr capsule Take 1 capsule by mouth 2 (two) times daily.   02/23/2018 at Unknown time  . folic acid (FOLVITE) 1 MG tablet Take 1 mg by mouth.   02/23/2018 at Unknown time  . furosemide (LASIX) 20 MG tablet Take 1 tablet (20 mg total) by mouth daily. 30 tablet 0 02/23/2018 at Unknown time  . insulin aspart protamine- aspart (NOVOLOG MIX 70/30) (70-30) 100 UNIT/ML injection Inject into the skin. Patient takes 40 units in the morning and 50 units at night.   02/24/2018 at Unknown time  . Ipratropium-Albuterol (COMBIVENT RESPIMAT) 20-100 MCG/ACT AERS respimat Inhale 1 puff into the lungs every 6 (six) hours.   02/23/2018 at Unknown time  . ketorolac (ACULAR) 0.5 % ophthalmic solution Place 1 drop into the right eye 4 (four) times daily.    02/23/2018 at Unknown time  . losartan (COZAAR) 100 MG tablet Take 100 mg by mouth daily.     02/23/2018 at Unknown time  . metFORMIN (GLUCOPHAGE) 1000 MG tablet Take 1,000 mg by mouth 2 (two) times daily with a meal.     02/24/2018 at Unknown time  . metoprolol (LOPRESSOR) 50 MG tablet Take 0.5 tablets (25 mg total) by mouth 2 (two) times daily. FOR HEART/BLOOD PRESSURE. HOLD FOR SYSTOLIC BLOOD PRESSURE LESS THAN 1OO/HEART RATE LESS THAN 60   02/23/2018 at 8:30am  . pioglitazone (ACTOS) 30 MG tablet Take 30 mg by mouth daily.    02/24/2018 at Unknown time  . potassium chloride (K-DUR,KLOR-CON) 10 MEQ tablet Take 2 tablets (20 mEq total) by mouth daily. Take with lasix 60 tablet 2 02/23/2018 at Unknown time  . spironolactone (ALDACTONE) 25 MG tablet Take 25 mg by mouth daily.   02/23/2018 at Unknown time  . tamsulosin (FLOMAX) 0.4 MG CAPS capsule Take 2 capsules  (0.8 mg total) by mouth every evening. 60 capsule 3 02/23/2018 at Unknown time  . timolol (BETIMOL) 0.5 % ophthalmic solution Place 1 drop into the left eye 2 (two) times daily.   02/23/2018 at Unknown time  . Adalimumab 80 MG/0.8ML & '40MG'$ /0.4ML PNKT Inject 1 Dose into the skin every 14 (fourteen) days.   02/20/2018  . clotrimazole-betamethasone (LOTRISONE) cream Apply 1 application topically 2 (two) times daily. (Patient taking differently: Apply 1 application topically as needed. ) 30 g 1 unknown  . methotrexate (RHEUMATREX) 2.5 MG tablet Take 20 mg by mouth once a week.   02/17/2018   Scheduled: . atorvastatin  40 mg Oral QPM  . atropine  1 drop Left Eye Daily  . dipyridamole-aspirin  1 capsule Oral BID  . enoxaparin (LOVENOX) injection  60 mg Subcutaneous Q24H  . insulin aspart  0-15 Units Subcutaneous TID WC  . insulin aspart  0-5 Units Subcutaneous QHS  . insulin aspart  8 Units Subcutaneous TID WC  . insulin  glargine  25 Units Subcutaneous QHS  . ipratropium-albuterol  3 mL Nebulization Q6H  . ketorolac  1 drop Right Eye QID  . methylPREDNISolone (SOLU-MEDROL) injection  40 mg Intravenous Q12H  . metoprolol tartrate  25 mg Oral BID  . potassium chloride  20 mEq Oral Daily  . tamsulosin  0.8 mg Oral QPM  . timolol  1 drop Left Eye BID   Continuous: . azithromycin Stopped (02/28/18 1818)  . cefTRIAXone (ROCEPHIN)  IV Stopped (02/28/18 1347)   FXJ:[id number] **OR** acetaminophen, clotrimazole-betamethasone, LORazepam, polyvinyl alcohol, senna-docusate, sodium chloride flush  Assesment: He was admitted with sepsis presumably from pneumonia.  He has developed diffuse bilateral infiltrates on chest x-ray.  When he was admitted he had fairly minimal findings on his chest CT and these have progressed.  He is no longer septic.  He had significant problems with metabolic encephalopathy and that has cleared.  He generally looks much better.  Sepsis has resolved.  He had 1 of 2+ blood cultures  and this was for micrococcus and may be a contaminant  He has sleep apnea at baseline.  So he will need to remain on positive pressure at night  He had acute on chronic renal failure which is better  He has diabetes and his blood sugar has been up at least partially because he is on IV steroids Principal Problem:   Sepsis ([facility]) Active Problems:   HTN (hypertension)   DM (diabetes mellitus) type II controlled with renal manifestation (H[facility])   CAD (coronary artery disease)   Obesity, Class III, BMI 40-49.9 (morbid obesity) ([place])   History of stroke   OSA (obstructive sleep apnea)   Dyspnea   Lactic acidosis   Acute-on-chronic kidney injury ([place])    Plan: Check chest x-ray today continue other treatments    LOS: 5 days   Dietrich Samuelson L 03/01/2018, 7:41 AM

## 2018-03-01 NOTE — Progress Notes (Signed)
Inpatient Diabetes Program Recommendations  AACE/ADA: New Consensus Statement on Inpatient Glycemic Control (2015)  Target Ranges:  Prepandial:   less than 140 mg/dL      Peak postprandial:   less than 180 mg/dL (1-2 hours)      Critically ill patients:  140 - 180 mg/dL   Results for DEMONTA, WOMBLES (MRN [id number]) as of 03/01/2018 09:13  Ref. Range 02/28/2018 07:57 02/28/2018 11:34 02/28/2018 16:56 02/28/2018 21:34 03/01/2018 07:34  Glucose-Capillary Latest Ref Range: 65 - 99 mg/dL 328 (H) 447 (H) 429 (H) 424 (H) 250 (H)    Review of Glycemic Control  Diabetes history: DM2 Outpatient Diabetes medications: 70/30 40 units QAM, 70/30 50 units QHS, Metformin 1000 mg BID, Actos 30 mg daily Current orders for Inpatient glycemic control: Lantus 25 units QHS, Novolog 8 units TID with meals for meal coverage, Novolog 0-15 units TID with meals, Novolog 0-5 units QHS; Solumedrol 40 mg Q12H  Inpatient Diabetes Program Recommendations: Insulin - Basal: Patient received a total of Lantus 38 units on 02/28/18 and fasting glucose 250 mg/dl this morning. If steroids are continued as ordered, please consider increasing Lantus 20 units BID (starting this morning). Insulin - Meal Coverage:Noted meal coverage increased to Novolog 8 units TID with meals yesterday and started this morning.  Thanks, Barnie Alderman, RN, MSN, CDE Diabetes Coordinator Inpatient Diabetes Program [phone] (Team Pager from 8am to 5pm)

## 2018-03-01 NOTE — Progress Notes (Signed)
[facility] PROGRESS NOTE  TRESHUN WOLD NIO:[id number] DOB: 23-Oct-1941 DOA: 02/24/2018 PCP: Alycia Rossetti, MD  Interim summary and HPI 77 year old male with morbid obesity, OSA, diabetes mellitus, hypertension, CAD status post CABG recently hospitalized from 4/22-4/23 with worsening dyspnea with work-up done including echo, CT angiogram of the chest, blood cultures and troponin which were unremarkable.  Patient was discharged home with the impression that this was related to OHS with recommendations to follow-up with PCP and obtain outpatient PFT. Patient reports that he was fine for a few days but had acute worsening of his symptoms with shortness of breath on minimal exertion.  At baseline he only walks about 10-15 feet limited by dyspnea.  Denies any wheezing but had some productive cough.  Denied any fevers at home. In the ED was septic with fever of 101.3 F, hypoxic in the high 80s on room air and a WBC of 10.8 (was normal on recent discharge).  He also had elevated lactic acid of 2.8, normal BNP, UA and chest x-ray without any infiltrate.  He had a repeat CT angiogram of the chest which was negative for PE with questionable bilateral hazy lung densities and some mediastinal hilar adenopathy.  Patient placed on empiric vancomycin and Zosyn for sepsis and given fluid bolus in the ED. Patient admitted for further management of sepsis.  Assessment/Plan: 1-sepsis: Presumed to be secondary to pneumonia based on x-ray findings. -Sepsis features resolved by now -Patient breathing steadily improving -Continue narrowing antibiotics (L to oral regimen), continue weaning oxygen and continue steroids tapering. -continue supportive care  2-acute respiratory failure with hypoxia -Most likely associated problem #1 -Patient with underlying obstructive sleep apnea and obesity hypoventilation syndrome -Continue weaning oxygen supplementation as tolerated.  Continue CPAP nightly.  3-acute kidney  injury on chronic kidney disease stage III -Renal function back to his baseline -Continue holding ARB and Aldactone -We will resume low-dose Lasix. -Follow-up renal function trend.  4-obstructive sleep apnea/obesity hypoventilation syndrome -Continue CPAP nightly -Patient will benefit of outpatient PFTs.  5-essential hypertension -Blood pressure stable and well-controlled -Will follow vital signs of the lucency but just at night and will slightly increase on metoprolol tomorrow morning.  6-type 2 diabetes mellitus with nephropathy -Continue sliding-scale insulin and Lantus -CBGs elevated in the setting of steroids use.  7-hyperlipidemia -Continue statins.    8-BPH -Currently no complaining of any urinary retention symptoms -will monitor and resume Flomax.  9-morbid obesity  -Body mass index is 42.54 kg/m. -low calorie diet and exercise discussed with patient    Code Status: Full Code. Family Communication: No family bedside Disposition Plan: Patient will be transferred to [place], antibiotics will be further narrowed to oral regimen, will resume low-dose Lasix, continue weaning steroids and oxygen supplementation as tolerated.   Consultants:  Pulmonary service.   Procedures:  See below for x-ray reports.  Antibiotics:  Vancomycin and zosyn 4/26>>4/29  Rocephin/Zithromax 02/28/18>>03/01/18  Doxycycline  03/01/18  HPI/Subjective: No fever, no chest pain, no nausea vomiting.  Patient feeling better still having some shortness of breath with minimal exertion.  He is still requiring 3-4 L nasal cannula oxygen supplementation.  Objective: Vitals:   03/01/18 1200 03/01/18 1300  BP: 136/73 137/67  Pulse: (!) 25 67  Resp: 19 (!) 22  Temp:    SpO2:      Intake/Output Summary (Last 24 hours) at 03/01/2018 1439 Last data filed at 03/01/2018 0909 Gross per 24 hour  Intake -  Output 1550 ml  Net -1550 ml  Filed Weights   02/27/18 0413 02/28/18 0500 03/01/18 0500   Weight: 127.9 kg (281 lb 15.5 oz) 127.6 kg (281 lb 4.9 oz) 126.9 kg (279 lb 12.2 oz)    Exam:   General: Afebrile, no chest pain, no nausea, no vomiting.  Reports improvement in his breathing orbital but is still sure of breath with minimal exertion.  Patient requiring 3-4 L of oxygen supplementation.  Cardiovascular: S1 and S2, no rubs, no gallops, no murmurs, unable to properly assess for JVDs due to body habitus.  Respiratory: Bibasilar fine crackles, scattered rhonchi and mild exp wheezing; no using accessory muscles.   Abdomen: Obese, soft, nontender, nondistended, positive bowel sounds.  Musculoskeletal: trace edema, no cyanosis, no clubbing.  Data Reviewed: Basic Metabolic Panel: Recent Labs  Lab 02/24/18 1451 02/25/18 0824 02/26/18 0835 02/27/18 0500 03/01/18 0435  NA 137 136 139 137 139  K 3.9 4.7 4.3 4.4 4.7  CL 101 102 103 103 107  CO2 22 21* 24 22 22   GLUCOSE 51* 142* 116* 206* 275*  BUN 28* 19 21* 22* 26*  CREATININE 1.67* 1.40* 1.66* 1.69* 1.29*  CALCIUM 8.7* 8.1* 8.2* 7.9* 8.0*   Liver Function Tests: Recent Labs  Lab 02/24/18 1451  AST 25  ALT 22  ALKPHOS 52  BILITOT 0.7  PROT 7.1  ALBUMIN 3.0*   CBC: Recent Labs  Lab 02/24/18 1103 02/25/18 0824  WBC 10.8* 11.4*  NEUTROABS 7.2  --   HGB 9.6* 9.5*  HCT 30.3* 30.6*  MCV 98.1 99.4  PLT 342 349   BNP (last 3 results) Recent Labs    02/15/18 1112 02/19/18 0855 02/24/18 1103  BNP 87 62.0 54.0   CBG: Recent Labs  Lab 02/28/18 1134 02/28/18 1656 02/28/18 2134 03/01/18 0734 03/01/18 1113  GLUCAP 447* 429* 424* 250* 325*    Recent Results (from the past 240 hour(s))  Blood Culture (routine x 2)     Status: None   Collection Time: 02/20/18  7:59 AM  Result Value Ref Range Status   Specimen Description BLOOD LEFT FOREARM  Final   Special Requests   Final    BOTTLES DRAWN AEROBIC AND ANAEROBIC Blood Culture adequate volume   Culture   Final    NO GROWTH 5 DAYS Performed at  [facility], [address], [place], [place] [address]    Report Status 02/25/2018 FINAL  Final  Blood Culture (routine x 2)     Status: None   Collection Time: 02/20/18  8:07 AM  Result Value Ref Range Status   Specimen Description BLOOD RIGHT ARM  Final   Special Requests   Final    BOTTLES DRAWN AEROBIC AND ANAEROBIC Blood Culture adequate volume   Culture   Final    NO GROWTH 5 DAYS Performed at [facility], [address], [place], [place] [address]    Report Status 02/25/2018 FINAL  Final  Urine culture     Status: None   Collection Time: 02/24/18 10:47 AM  Result Value Ref Range Status   Specimen Description   Final    URINE, CLEAN CATCH Performed at [facility], [address], [place], [place] [address]    Special Requests   Final    NONE Performed at [facility], [address], [place], [place] [address]    Culture   Final    NO GROWTH Performed at [facility], [place] [address] [place], [place] [address]    Report Status 02/26/2018 FINAL  Final  Culture, blood (routine x 2)     Status: None   Collection Time: 02/24/18 11:03 AM  Result Value Ref Range Status   Specimen Description BLOOD BLOOD LEFT HAND  Final   Special Requests   Final    BOTTLES DRAWN AEROBIC AND ANAEROBIC Blood Culture adequate volume   Culture   Final    NO GROWTH 5 DAYS Performed at [facility], [address] [place], [place] [address]    Report Status 03/01/2018 FINAL  Final  Culture, blood (routine x 2)     Status: Abnormal   Collection Time: 02/24/18 11:03 AM  Result Value Ref Range Status   Specimen Description   Final    BLOOD BLOOD LEFT HAND Performed at [facility], [address], [place], [place] [address]    Special Requests   Final    BOTTLES DRAWN AEROBIC AND ANAEROBIC Blood Culture adequate volume Performed at [facility], [address] [place], [place] [address]    Culture  Setup Time   Final    GRAM POSITIVE COCCI Gram Stain Report Called to,Read Back By  and Verified With: DILDY @ 0802 ON [id number] BY HENDERSON L.  ANAEROBIC BOTTLE ONLY CRITICAL RESULT CALLED TO, READ BACK BY AND VERIFIED WITH: S HURTH,PHARMD AT 1152 02/26/18 BY L BENFIELD    Culture (A)  Final    MICROCOCCUS SPECIES Standardized susceptibility testing for this organism is not available. Performed at [facility], [place] [address], [place], [place] [address]    Report Status 02/28/2018 FINAL  Final  Blood Culture ID Panel (Reflexed)     Status: None   Collection Time: 02/24/18 11:03 AM  Result Value Ref Range Status   Enterococcus species NOT DETECTED NOT DETECTED Final   Listeria monocytogenes NOT DETECTED NOT DETECTED Final   Staphylococcus species NOT DETECTED NOT DETECTED Final   Staphylococcus aureus NOT DETECTED NOT DETECTED Final   Streptococcus species NOT DETECTED NOT DETECTED Final   Streptococcus agalactiae NOT DETECTED NOT DETECTED Final   Streptococcus pneumoniae NOT DETECTED NOT DETECTED Final   Streptococcus pyogenes NOT DETECTED NOT DETECTED Final   Acinetobacter baumannii NOT DETECTED NOT DETECTED Final   Enterobacteriaceae species NOT DETECTED NOT DETECTED Final   Enterobacter cloacae complex NOT DETECTED NOT DETECTED Final   Escherichia coli NOT DETECTED NOT DETECTED Final   Klebsiella oxytoca NOT DETECTED NOT DETECTED Final   Klebsiella pneumoniae NOT DETECTED NOT DETECTED Final   Proteus species NOT DETECTED NOT DETECTED Final   Serratia marcescens NOT DETECTED NOT DETECTED Final   Haemophilus influenzae NOT DETECTED NOT DETECTED Final   Neisseria meningitidis NOT DETECTED NOT DETECTED Final   Pseudomonas aeruginosa NOT DETECTED NOT DETECTED Final   Candida albicans NOT DETECTED NOT DETECTED Final   Candida glabrata NOT DETECTED NOT DETECTED Final   Candida krusei NOT DETECTED NOT DETECTED Final   Candida parapsilosis NOT DETECTED NOT DETECTED Final   Candida tropicalis NOT DETECTED NOT DETECTED Final    Comment: Performed at [facility], [place] [address] [place], [place] [address]  Respiratory Panel by PCR     Status: None   Collection Time: 02/24/18 10:00 PM  Result Value Ref Range Status   Adenovirus NOT DETECTED NOT DETECTED Final   Coronavirus 229E NOT DETECTED NOT DETECTED Final   Coronavirus HKU1 NOT DETECTED NOT DETECTED Final   Coronavirus NL63 NOT DETECTED NOT DETECTED Final   Coronavirus OC43 NOT DETECTED NOT DETECTED Final   Metapneumovirus NOT DETECTED  NOT DETECTED Final   Rhinovirus / Enterovirus NOT DETECTED NOT DETECTED Final   Influenza A NOT DETECTED NOT DETECTED Final   Influenza B NOT DETECTED NOT DETECTED Final   Parainfluenza Virus 1 NOT DETECTED NOT DETECTED Final   Parainfluenza Virus 2 NOT DETECTED NOT DETECTED Final   Parainfluenza Virus 3 NOT DETECTED NOT DETECTED Final   Parainfluenza Virus 4 NOT DETECTED NOT DETECTED Final   Respiratory Syncytial Virus NOT DETECTED NOT DETECTED Final   Bordetella pertussis NOT DETECTED NOT DETECTED Final   Chlamydophila pneumoniae NOT DETECTED NOT DETECTED Final   Mycoplasma pneumoniae NOT DETECTED NOT DETECTED Final    Comment: Performed at [facility], [place] [address], [place], [place] [address]  MRSA PCR Screening     Status: None   Collection Time: 02/26/18 12:53 PM  Result Value Ref Range Status   MRSA by PCR NEGATIVE NEGATIVE Final    Comment:        The GeneXpert MRSA Assay (FDA approved for NASAL specimens only), is one component of a comprehensive MRSA colonization surveillance program. It is not intended to diagnose MRSA infection nor to guide or monitor treatment for MRSA infections. Performed at [facility], [address], [place],  [address]      Studies: Dg Chest [facility] 1 View  Result Date: 03/01/2018 CLINICAL DATA:  Respiratory failure. EXAM: PORTABLE CHEST 1 VIEW COMPARISON:  Radiograph of February 26, 2018. FINDINGS: Stable cardiomegaly. Sternotomy wires are noted. No pneumothorax is noted. Left lung is clear.  Improved bilateral perihilar opacities are noted suggesting improving edema or inflammation. Bony thorax is unremarkable. IMPRESSION: Improving bilateral perihilar edema or inflammation. Electronically Signed   By: Marijo Conception, M.D.   On: 03/01/2018 11:39    Scheduled Meds: . atorvastatin  40 mg Oral QPM  . atropine  1 drop Left Eye Daily  . dipyridamole-aspirin  1 capsule Oral BID  . enoxaparin (LOVENOX) injection  60 mg Subcutaneous Q24H  . insulin aspart  0-15 Units Subcutaneous TID WC  . insulin aspart  0-5 Units Subcutaneous QHS  . insulin aspart  8 Units Subcutaneous TID WC  . insulin glargine  25 Units Subcutaneous QHS  . ipratropium-albuterol  3 mL Nebulization Q6H  . ketorolac  1 drop Right Eye QID  . methylPREDNISolone (SOLU-MEDROL) injection  40 mg Intravenous Q12H  . metoprolol tartrate  25 mg Oral BID  . potassium chloride  20 mEq Oral Daily  . tamsulosin  0.8 mg Oral QPM  . timolol  1 drop Left Eye BID   Continuous Infusions: . azithromycin Stopped (02/28/18 1818)  . cefTRIAXone (ROCEPHIN)  IV Stopped (03/01/18 1213)    Principal Problem:   Sepsis ([place]) Active Problems:   HTN (hypertension)   DM (diabetes mellitus) type II controlled with renal manifestation (HCC)   CAD (coronary artery disease)   Obesity, Class III, BMI 40-49.9 (morbid obesity) (National)   History of stroke   OSA (obstructive sleep apnea)   Dyspnea   Lactic acidosis   Acute-on-chronic kidney injury ([place])    Time spent: 30 minutes    [facility] Pager [phone]. If 7PM-7AM, please contact night-coverage at [email or web address], password [facility] 03/01/2018, 2:39 PM  LOS: 5 days

## 2018-03-01 NOTE — Care Management Note (Signed)
Case Management Note  Patient Details  Name: TAN CLOPPER MRN: [id number] Date of Birth: 02-Jun-1941  Subjective/Objective: Adm with sepsis related to Pneumonia. From home with wife. Walks with cane. Uses CPAP at home. Goes to [facility]. Get meds through mail order supplied by [place].               Action/Plan: CM following for needs.  Currently on 4 liters of oxygen. No oxygen pta.  Expected Discharge Date:     unk             Expected Discharge Plan:     In-House Referral:     Discharge planning Services  CM Consult  Post Acute Care Choice:    Choice offered to:     DME Arranged:    DME Agency:     HH Arranged:    HH Agency:     Status of Service:  In process, will continue to follow  If discussed at Long Length of Stay Meetings, dates discussed:    Additional Comments:  Duffy Dantonio, Chauncey Reading, RN 03/01/2018, 1:55 PM

## 2018-03-01 NOTE — Progress Notes (Signed)
Pt got up to [facility] with 1 person assist to have BM this morning. Did well with transfer.

## 2018-03-02 LAB — GLUCOSE, CAPILLARY
Glucose-Capillary: 328 mg/dL — ABNORMAL HIGH (ref 65–99)
Glucose-Capillary: 299 mg/dL — ABNORMAL HIGH (ref 65–99)
Glucose-Capillary: 263 mg/dL — ABNORMAL HIGH (ref 65–99)
Glucose-Capillary: 331 mg/dL — ABNORMAL HIGH (ref 65–99)

## 2018-03-02 MED ORDER — IPRATROPIUM-ALBUTEROL 0.5-2.5 (3) MG/3ML IN SOLN
3.0000 mL | Freq: Three times a day (TID) | RESPIRATORY_TRACT | Status: DC
  Administered 2018-03-03 (×2): 3 mL via RESPIRATORY_TRACT
  Filled 2018-03-02 (×2): qty 3

## 2018-03-02 MED ORDER — ALBUTEROL SULFATE (2.5 MG/3ML) 0.083% IN NEBU: 2.5000 mg | INHALATION_SOLUTION | RESPIRATORY_TRACT | Status: DC | PRN

## 2018-03-02 MED ORDER — PREDNISONE 20 MG PO TABS
40.0000 mg | ORAL_TABLET | Freq: Every day | ORAL | Status: DC
  Administered 2018-03-03: 40 mg via ORAL
  Filled 2018-03-02: qty 2

## 2018-03-02 MED ORDER — DIPHENHYDRAMINE-ZINC ACETATE 2-0.1 % EX CREA
TOPICAL_CREAM | Freq: Three times a day (TID) | CUTANEOUS | Status: DC | PRN
  Administered 2018-03-03: 09:00:00 via TOPICAL
  Filled 2018-03-02: qty 28

## 2018-03-02 NOTE — Progress Notes (Signed)
[facility] PROGRESS NOTE  [name] VFI:[id number] DOB: [birth year] DOA: 02/24/2018 PCP: Alycia Rossetti, MD  Interim summary and HPI 77 year old male with morbid obesity, OSA, diabetes mellitus, hypertension, CAD status post CABG recently hospitalized from 4/22-4/23 with worsening dyspnea with work-up done including echo, CT angiogram of the chest, blood cultures and troponin which were unremarkable.  Patient was discharged home with the impression that this was related to OHS with recommendations to follow-up with PCP and obtain outpatient PFT. Patient reports that he was fine for a few days but had acute worsening of his symptoms with shortness of breath on minimal exertion.  At baseline he only walks about 10-15 feet limited by dyspnea.  Denies any wheezing but had some productive cough.  Denied any fevers at home. In the ED was septic with fever of 101.3 F, hypoxic in the high 80s on room air and a WBC of 10.8 (was normal on recent discharge).  He also had elevated lactic acid of 2.8, normal BNP, UA and chest x-ray without any infiltrate.  He had a repeat CT angiogram of the chest which was negative for PE with questionable bilateral hazy lung densities and some mediastinal hilar adenopathy.  Patient placed on empiric vancomycin and Zosyn for sepsis and given fluid bolus in the ED. Patient admitted for further management of sepsis.  Assessment/Plan: 1-sepsis: Presumed to be secondary to pneumonia based on x-ray findings. -Sepsis features resolved by now -Patient breathing steadily improving -Continue narrowing antibiotics (L to oral regimen), continue weaning oxygen and continue steroids tapering. -continue supportive care  2-acute respiratory failure with hypoxia -Most likely associated problem #1 -Patient with underlying obstructive sleep apnea and obesity hypoventilation syndrome -Continue weaning oxygen supplementation as tolerated.  Continue CPAP nightly.  3-acute kidney  injury on chronic kidney disease stage III -Renal function back to his baseline -Continue holding ARB and Aldactone -Will resume low-dose Lasix. -Follow-up renal function trend.  4-obstructive sleep apnea/obesity hypoventilation syndrome -Continue CPAP nightly -Patient will benefit of outpatient PFTs.  5-essential hypertension -Blood pressure stable and well-controlled -Will follow vital signs of the lucency but just at night and will slightly increase on metoprolol tomorrow morning.  6-type 2 diabetes mellitus with nephropathy -Continue sliding-scale insulin and Lantus -CBGs elevated in the setting of steroids use.  7-hyperlipidemia -Continue statins.    8-BPH -Currently no complaining of any urinary retention symptoms -will monitor and resume Flomax.  9-morbid obesity  -Body mass index is 42.54 kg/m. -low calorie diet and exercise discussed with patient    Code Status: Full Code. Family Communication: No family bedside Disposition Plan: Patient will be transferred to [place], antibiotics will be further narrowed to oral regimen, will resume low-dose Lasix, continue weaning steroids and oxygen supplementation as tolerated.   Consultants:  Pulmonary service.   Procedures:  See below for x-ray reports.  Antibiotics:  Vancomycin and zosyn 4/26>>4/29  Rocephin/Zithromax 02/28/18>>03/01/18  Doxycycline  03/01/18  HPI/Subjective: No fever, no chest pain, no nausea, no vomiting.  Reports breathing continued improvement.  He has 3 L of nasal cannula supplementation.  Patient with itching on his back.  Objective: Vitals:   03/02/18 1342 03/02/18 1416  BP:  (!) 147/69  Pulse:  62  Resp:  18  Temp:  98.3 F (36.8 C)  SpO2: 100% 100%    Intake/Output Summary (Last 24 hours) at 03/02/2018 1720 Last data filed at 03/02/2018 1422 Gross per 24 hour  Intake 480 ml  Output 1050 ml  Net -570 ml  Filed Weights   02/27/18 0413 02/28/18 0500 03/01/18 0500  Weight: 127.9  kg (281 lb 15.5 oz) 127.6 kg (281 lb 4.9 oz) 126.9 kg (279 lb 12.2 oz)    Exam:   General: No fever, no chest pain, no nausea, no vomiting.  Patient with an continued improvement.  Using 3 L oxygen supplementation through nasal cannula and nightly CPAP.  He reports some itching in his back; otherwise no acute complaints.    Cardiovascular: S1 and S2, no rubs, no gallops, no murmurs  Respiratory: Decreased breath sounds at the bases, no frank crackles appreciated today, scattered rhonchi on exam, no wheezing.  Patient is not using accessory muscles and is not tachypneic.    Abdomen: Obese, soft, nontender, nondistended, positive bowel sounds.  Musculoskeletal: Trace edema, no cyanosis, no clubbing.    Data Reviewed: Basic Metabolic Panel: Recent Labs  Lab 02/24/18 1451 02/25/18 0824 02/26/18 0835 02/27/18 0500 03/01/18 0435  NA 137 136 139 137 139  K 3.9 4.7 4.3 4.4 4.7  CL 101 102 103 103 107  CO2 22 21* 24 22 22   GLUCOSE 51* 142* 116* 206* 275*  BUN 28* 19 21* 22* 26*  CREATININE 1.67* 1.40* 1.66* 1.69* 1.29*  CALCIUM 8.7* 8.1* 8.2* 7.9* 8.0*   Liver Function Tests: Recent Labs  Lab 02/24/18 1451  AST 25  ALT 22  ALKPHOS 52  BILITOT 0.7  PROT 7.1  ALBUMIN 3.0*   CBC: Recent Labs  Lab 02/24/18 1103 02/25/18 0824  WBC 10.8* 11.4*  NEUTROABS 7.2  --   HGB 9.6* 9.5*  HCT 30.3* 30.6*  MCV 98.1 99.4  PLT 342 349   BNP (last 3 results) Recent Labs    02/15/18 1112 02/19/18 0855 02/24/18 1103  BNP 87 62.0 54.0   CBG: Recent Labs  Lab 03/01/18 1619 03/01/18 2313 03/02/18 0725 03/02/18 1118 03/02/18 1626  GLUCAP 324* 217* 328* 299* 263*    Recent Results (from the past 240 hour(s))  Urine culture     Status: None   Collection Time: 02/24/18 10:47 AM  Result Value Ref Range Status   Specimen Description   Final    URINE, CLEAN CATCH Performed at [facility], [address] [place], [place] [address]    Special Requests   Final     NONE Performed at [facility], [address], [place], [place] [address]    Culture   Final    NO GROWTH Performed at [facility], [place] [address], [place], [place] [address]    Report Status 02/26/2018 FINAL  Final  Culture, blood (routine x 2)     Status: None   Collection Time: 02/24/18 11:03 AM  Result Value Ref Range Status   Specimen Description BLOOD BLOOD LEFT HAND  Final   Special Requests   Final    BOTTLES DRAWN AEROBIC AND ANAEROBIC Blood Culture adequate volume   Culture   Final    NO GROWTH 5 DAYS Performed at [facility], [address], [place], [place] [address]    Report Status 03/01/2018 FINAL  Final  Culture, blood (routine x 2)     Status: Abnormal   Collection Time: 02/24/18 11:03 AM  Result Value Ref Range Status   Specimen Description   Final    BLOOD BLOOD LEFT HAND Performed at [facility], [address] [place], [place] [address]    Special Requests   Final    BOTTLES DRAWN AEROBIC AND ANAEROBIC Blood Culture adequate volume  Performed at [facility], [address] [place], [place] [address]    Culture  Setup Time   Final    GRAM POSITIVE COCCI Gram Stain Report Called to,Read Back By and Verified With: DILDY @ 0802 ON [id number] BY HENDERSON L.  ANAEROBIC BOTTLE ONLY CRITICAL RESULT CALLED TO, READ BACK BY AND VERIFIED WITH: S HURTH,PHARMD AT 1152 02/26/18 BY L BENFIELD    Culture (A)  Final    MICROCOCCUS SPECIES Standardized susceptibility testing for this organism is not available. Performed at [facility], [place] [address], [place], [place] [address]    Report Status 02/28/2018 FINAL  Final  Blood Culture ID Panel (Reflexed)     Status: None   Collection Time: 02/24/18 11:03 AM  Result Value Ref Range Status   Enterococcus species NOT DETECTED NOT DETECTED Final   Listeria monocytogenes NOT DETECTED NOT DETECTED Final   Staphylococcus species NOT DETECTED NOT DETECTED Final   Staphylococcus aureus NOT DETECTED NOT DETECTED  Final   Streptococcus species NOT DETECTED NOT DETECTED Final   Streptococcus agalactiae NOT DETECTED NOT DETECTED Final   Streptococcus pneumoniae NOT DETECTED NOT DETECTED Final   Streptococcus pyogenes NOT DETECTED NOT DETECTED Final   Acinetobacter baumannii NOT DETECTED NOT DETECTED Final   Enterobacteriaceae species NOT DETECTED NOT DETECTED Final   Enterobacter cloacae complex NOT DETECTED NOT DETECTED Final   Escherichia coli NOT DETECTED NOT DETECTED Final   Klebsiella oxytoca NOT DETECTED NOT DETECTED Final   Klebsiella pneumoniae NOT DETECTED NOT DETECTED Final   Proteus species NOT DETECTED NOT DETECTED Final   Serratia marcescens NOT DETECTED NOT DETECTED Final   Haemophilus influenzae NOT DETECTED NOT DETECTED Final   Neisseria meningitidis NOT DETECTED NOT DETECTED Final   Pseudomonas aeruginosa NOT DETECTED NOT DETECTED Final   Candida albicans NOT DETECTED NOT DETECTED Final   Candida glabrata NOT DETECTED NOT DETECTED Final   Candida krusei NOT DETECTED NOT DETECTED Final   Candida parapsilosis NOT DETECTED NOT DETECTED Final   Candida tropicalis NOT DETECTED NOT DETECTED Final    Comment: Performed at [facility], [place] [address] [place], [place] [address]  Respiratory Panel by PCR     Status: None   Collection Time: 02/24/18 10:00 PM  Result Value Ref Range Status   Adenovirus NOT DETECTED NOT DETECTED Final   Coronavirus 229E NOT DETECTED NOT DETECTED Final   Coronavirus HKU1 NOT DETECTED NOT DETECTED Final   Coronavirus NL63 NOT DETECTED NOT DETECTED Final   Coronavirus OC43 NOT DETECTED NOT DETECTED Final   Metapneumovirus NOT DETECTED NOT DETECTED Final   Rhinovirus / Enterovirus NOT DETECTED NOT DETECTED Final   Influenza A NOT DETECTED NOT DETECTED Final   Influenza B NOT DETECTED NOT DETECTED Final   Parainfluenza Virus 1 NOT DETECTED NOT DETECTED Final   Parainfluenza Virus 2 NOT DETECTED NOT DETECTED Final   Parainfluenza Virus 3 NOT DETECTED  NOT DETECTED Final   Parainfluenza Virus 4 NOT DETECTED NOT DETECTED Final   Respiratory Syncytial Virus NOT DETECTED NOT DETECTED Final   Bordetella pertussis NOT DETECTED NOT DETECTED Final   Chlamydophila pneumoniae NOT DETECTED NOT DETECTED Final   Mycoplasma pneumoniae NOT DETECTED NOT DETECTED Final    Comment: Performed at [facility], [place] [address] [place],  [address]  MRSA PCR Screening     Status: None   Collection Time: 02/26/18 12:53 PM  Result Value Ref Range Status   MRSA by PCR NEGATIVE NEGATIVE Final  Comment:        The GeneXpert MRSA Assay (FDA approved for NASAL specimens only), is one component of a comprehensive MRSA colonization surveillance program. It is not intended to diagnose MRSA infection nor to guide or monitor treatment for MRSA infections. Performed at [facility], [address] [place], [place] [address]      Studies: Dg Chest [facility] 1 View  Result Date: 03/01/2018 CLINICAL DATA:  Respiratory failure. EXAM: PORTABLE CHEST 1 VIEW COMPARISON:  Radiograph of February 26, 2018. FINDINGS: Stable cardiomegaly. Sternotomy wires are noted. No pneumothorax is noted. Left lung is clear. Improved bilateral perihilar opacities are noted suggesting improving edema or inflammation. Bony thorax is unremarkable. IMPRESSION: Improving bilateral perihilar edema or inflammation. Electronically Signed   By: Marijo Conception, M.D.   On: 03/01/2018 11:39    Scheduled Meds: . atorvastatin  40 mg Oral QPM  . atropine  1 drop Left Eye Daily  . dipyridamole-aspirin  1 capsule Oral BID  . doxycycline  100 mg Oral Q12H  . enoxaparin (LOVENOX) injection  60 mg Subcutaneous Q24H  . furosemide  20 mg Oral Daily  . insulin aspart  0-15 Units Subcutaneous TID WC  . insulin aspart  0-5 Units Subcutaneous QHS  . insulin aspart  8 Units Subcutaneous TID WC  . insulin glargine  25 Units Subcutaneous QHS  . ipratropium-albuterol  3 mL Nebulization Q6H  . ketorolac   1 drop Right Eye QID  . metoprolol tartrate  25 mg Oral BID  . potassium chloride  20 mEq Oral Daily  . [START ON 03/03/2018] predniSONE  40 mg Oral Q breakfast  . tamsulosin  0.8 mg Oral QPM  . timolol  1 drop Left Eye BID   Continuous Infusions:   Principal Problem:   Sepsis ([place]) Active Problems:   HTN (hypertension)   DM (diabetes mellitus) type II controlled with renal manifestation ([facility])   CAD (coronary artery disease)   Obesity, Class III, BMI 40-49.9 (morbid obesity) ([place])   History of stroke   OSA (obstructive sleep apnea)   Dyspnea   Lactic acidosis   Acute-on-chronic kidney injury ([place])    Time spent: 25 minutes    [facility] Pager [phone]. If 7PM-7AM, please contact night-coverage at [email or web address], password [facility] 03/02/2018, 5:20 PM  LOS: 6 days

## 2018-03-02 NOTE — Progress Notes (Addendum)
Inpatient Diabetes Program Recommendations  AACE/ADA: New Consensus Statement on Inpatient Glycemic Control (2019)  Target Ranges:  Prepandial:   less than 140 mg/dL      Peak postprandial:   less than 180 mg/dL (1-2 hours)      Critically ill patients:  140 - 180 mg/dL  Results for Charles, [name] (MRN [id number]) as of 03/02/2018 08:48  Ref. Range 03/01/2018 07:34 03/01/2018 11:13 03/01/2018 16:19 03/01/2018 23:13 03/02/2018 07:25  Glucose-Capillary Latest Ref Range: 65 - 99 mg/dL 250 (H)  Novolog 13 units (8 MC + 5 SSI) 325 (H)  Novolog 19 units (11 SSI + 8 MC) 324 (H)  Novolog 19 units (11 SSI + 8 MC) 217 (H)  Novolog 2 units  Lantus 25 units 328 (H)  Novolog 19 units (11 SSI + 8 MC)   Results for Charles, [name] (MRN [id number]) as of 03/01/2018 09:13  Ref. Range 02/28/2018 07:57 02/28/2018 11:34 02/28/2018 16:56 02/28/2018 21:34 03/01/2018 07:34  Glucose-Capillary Latest Ref Range: 65 - 99 mg/dL 328 (H) 447 (H) 429 (H) 424 (H) 250 (H)    Review of Glycemic Control  Diabetes history: DM2 Outpatient Diabetes medications: 70/30 40 units QAM, 70/30 50 units QHS, Metformin 1000 mg BID, Actos 30 mg daily Current orders for Inpatient glycemic control: Lantus 25 units QHS, Novolog 8 units TID with meals for meal coverage, Novolog 0-15 units TID with meals, Novolog 0-5 units QHS; Solumedrol 40 mg Q12H  Inpatient Diabetes Program Recommendations: Insulin - Basal:  If steroids are continued as ordered, please consider increasing Lantus 20 units BID (starting this morning). Insulin - Meal Coverage: Please consider increasing meal coverage to Novolog 12 units TID with meals.  NOTE: Over the past 24 hours, patient has received a total of Novolog 72 units (40 for correction and 32 units for meal coverage).    Thanks, Barnie Alderman, RN, MSN, CDE Diabetes Coordinator Inpatient Diabetes Program [phone] (Team Pager from 8am to 5pm)

## 2018-03-02 NOTE — Progress Notes (Signed)
Subjective: He was admitted with sepsis from pneumonia.  He required BiPAP but now is on nasal cannula with CPAP at night.  He is much improved and has been transferred out of stepdown unit  Objective: Vital signs in last 24 hours: Temp:  [97.8 F (36.6 C)-98.2 F (36.8 C)] 98.2 F (36.8 C) (05/02 0438) Pulse Rate:  [25-77] 75 (05/02 0438) Resp:  [14-22] 20 (05/01 2237) BP: (136-152)/(54-73) 152/66 (05/02 0438) SpO2:  [96 %-100 %] 100 % (05/02 0804) Weight change:  Last BM Date: 03/01/18  Intake/Output from previous day: 05/01 0701 - 05/02 0700 In: -  Out: 1850 [Urine:1850]  PHYSICAL EXAM General appearance: alert, cooperative and no distress Resp: clear to auscultation bilaterally Cardio: regular rate and rhythm, S1, S2 normal, no murmur, click, rub or gallop GI: soft, non-tender; bowel sounds normal; no masses,  no organomegaly Extremities: extremities normal, atraumatic, no cyanosis or edema  Lab Results:  Results for orders placed or performed during the hospital encounter of 02/24/18 (from the past 48 hour(s))  Glucose, capillary     Status: Abnormal   Collection Time: 02/28/18 11:34 AM  Result Value Ref Range   Glucose-Capillary 447 (H) 65 - 99 mg/dL  Glucose, capillary     Status: Abnormal   Collection Time: 02/28/18  4:56 PM  Result Value Ref Range   Glucose-Capillary 429 (H) 65 - 99 mg/dL  Glucose, capillary     Status: Abnormal   Collection Time: 02/28/18  9:34 PM  Result Value Ref Range   Glucose-Capillary 424 (H) 65 - 99 mg/dL  Iron and TIBC     Status: Abnormal   Collection Time: 03/01/18  4:35 AM  Result Value Ref Range   Iron 74 45 - 182 ug/dL   TIBC 129 (L) 250 - 450 ug/dL   Saturation Ratios 57 (H) 17.9 - 39.5 %   UIBC 55 ug/dL    Comment: Performed at [facility], [address], [place], [place] [address]  Vitamin B12     Status: Abnormal   Collection Time: 03/01/18  4:35 AM  Result Value Ref Range   Vitamin B-12 168 (L) 180 - 914 pg/mL     Comment: (NOTE) This assay is not validated for testing neonatal or myeloproliferative syndrome specimens for Vitamin B12 levels. Performed at [facility], [place] [address] [place], [place] [address]   Basic metabolic panel     Status: Abnormal   Collection Time: 03/01/18  4:35 AM  Result Value Ref Range   Sodium 139 135 - 145 mmol/L   Potassium 4.7 3.5 - 5.1 mmol/L   Chloride 107 101 - 111 mmol/L   CO2 22 22 - 32 mmol/L   Glucose, Bld 275 (H) 65 - 99 mg/dL   BUN 26 (H) 6 - 20 mg/dL   Creatinine, Ser 1.29 (H) 0.61 - 1.24 mg/dL   Calcium 8.0 (L) 8.9 - 10.3 mg/dL   GFR calc non Af Amer 53 (L) >60 mL/min   GFR calc Af Amer >60 >60 mL/min    Comment: (NOTE) The eGFR has been calculated using the CKD EPI equation. This calculation has not been validated in all clinical situations. eGFR's persistently <60 mL/min signify possible Chronic Kidney Disease.    Anion gap 10 5 - 15    Comment: Performed at [facility], [address] [place], [place] [address]  Glucose, capillary     Status: Abnormal   Collection Time: 03/01/18  7:34 AM  Result Value  Ref Range   Glucose-Capillary 250 (H) 65 - 99 mg/dL  Glucose, capillary     Status: Abnormal   Collection Time: 03/01/18 11:13 AM  Result Value Ref Range   Glucose-Capillary 325 (H) 65 - 99 mg/dL  Glucose, capillary     Status: Abnormal   Collection Time: 03/01/18  4:19 PM  Result Value Ref Range   Glucose-Capillary 324 (H) 65 - 99 mg/dL  Glucose, capillary     Status: Abnormal   Collection Time: 03/01/18 11:13 PM  Result Value Ref Range   Glucose-Capillary 217 (H) 65 - 99 mg/dL   Comment 1 Notify RN    Comment 2 Document in Chart   Glucose, capillary     Status: Abnormal   Collection Time: 03/02/18  7:25 AM  Result Value Ref Range   Glucose-Capillary 328 (H) 65 - 99 mg/dL    ABGS No results for input(s): PHART, PO2ART, TCO2, HCO3 in the last 72 hours.  Invalid input(s): PCO2 CULTURES Recent Results (from the past  240 hour(s))  Urine culture     Status: None   Collection Time: 02/24/18 10:47 AM  Result Value Ref Range Status   Specimen Description   Final    URINE, CLEAN CATCH Performed at [facility], [address], [place], [place] [address]    Special Requests   Final    NONE Performed at [facility], [address], [place], [place] [address]    Culture   Final    NO GROWTH Performed at [facility], [place] [address], [place], [place] [address]    Report Status 02/26/2018 FINAL  Final  Culture, blood (routine x 2)     Status: None   Collection Time: 02/24/18 11:03 AM  Result Value Ref Range Status   Specimen Description BLOOD BLOOD LEFT HAND  Final   Special Requests   Final    BOTTLES DRAWN AEROBIC AND ANAEROBIC Blood Culture adequate volume   Culture   Final    NO GROWTH 5 DAYS Performed at [facility], [address], [place], [place] [address]    Report Status 03/01/2018 FINAL  Final  Culture, blood (routine x 2)     Status: Abnormal   Collection Time: 02/24/18 11:03 AM  Result Value Ref Range Status   Specimen Description   Final    BLOOD BLOOD LEFT HAND Performed at [facility], [address], [place], [place] [address]    Special Requests   Final    BOTTLES DRAWN AEROBIC AND ANAEROBIC Blood Culture adequate volume Performed at [facility], [address] [place], [place] [address]    Culture  Setup Time   Final    GRAM POSITIVE COCCI Gram Stain Report Called to,Read Back By and Verified With: DILDY @ 0802 ON [id number] BY HENDERSON L.  ANAEROBIC BOTTLE ONLY CRITICAL RESULT CALLED TO, READ BACK BY AND VERIFIED WITH: S HURTH,PHARMD AT 1152 02/26/18 BY L BENFIELD    Culture (A)  Final    MICROCOCCUS SPECIES Standardized susceptibility testing for this organism is not available. Performed at [facility], [place] [address], [place], [place] [address]    Report Status 02/28/2018 FINAL  Final  Blood Culture ID Panel (Reflexed)     Status: None   Collection Time:  02/24/18 11:03 AM  Result Value Ref Range Status   Enterococcus species NOT DETECTED NOT DETECTED Final   Listeria monocytogenes NOT DETECTED NOT DETECTED Final   Staphylococcus species NOT DETECTED NOT DETECTED  Final   Staphylococcus aureus NOT DETECTED NOT DETECTED Final   Streptococcus species NOT DETECTED NOT DETECTED Final   Streptococcus agalactiae NOT DETECTED NOT DETECTED Final   Streptococcus pneumoniae NOT DETECTED NOT DETECTED Final   Streptococcus pyogenes NOT DETECTED NOT DETECTED Final   Acinetobacter baumannii NOT DETECTED NOT DETECTED Final   Enterobacteriaceae species NOT DETECTED NOT DETECTED Final   Enterobacter cloacae complex NOT DETECTED NOT DETECTED Final   Escherichia coli NOT DETECTED NOT DETECTED Final   Klebsiella oxytoca NOT DETECTED NOT DETECTED Final   Klebsiella pneumoniae NOT DETECTED NOT DETECTED Final   Proteus species NOT DETECTED NOT DETECTED Final   Serratia marcescens NOT DETECTED NOT DETECTED Final   Haemophilus influenzae NOT DETECTED NOT DETECTED Final   Neisseria meningitidis NOT DETECTED NOT DETECTED Final   Pseudomonas aeruginosa NOT DETECTED NOT DETECTED Final   Candida albicans NOT DETECTED NOT DETECTED Final   Candida glabrata NOT DETECTED NOT DETECTED Final   Candida krusei NOT DETECTED NOT DETECTED Final   Candida parapsilosis NOT DETECTED NOT DETECTED Final   Candida tropicalis NOT DETECTED NOT DETECTED Final    Comment: Performed at [facility], [place] [address], [place], [place] [address]  Respiratory Panel by PCR     Status: None   Collection Time: 02/24/18 10:00 PM  Result Value Ref Range Status   Adenovirus NOT DETECTED NOT DETECTED Final   Coronavirus 229E NOT DETECTED NOT DETECTED Final   Coronavirus HKU1 NOT DETECTED NOT DETECTED Final   Coronavirus NL63 NOT DETECTED NOT DETECTED Final   Coronavirus OC43 NOT DETECTED NOT DETECTED Final   Metapneumovirus NOT DETECTED NOT DETECTED Final   Rhinovirus / Enterovirus NOT  DETECTED NOT DETECTED Final   Influenza A NOT DETECTED NOT DETECTED Final   Influenza B NOT DETECTED NOT DETECTED Final   Parainfluenza Virus 1 NOT DETECTED NOT DETECTED Final   Parainfluenza Virus 2 NOT DETECTED NOT DETECTED Final   Parainfluenza Virus 3 NOT DETECTED NOT DETECTED Final   Parainfluenza Virus 4 NOT DETECTED NOT DETECTED Final   Respiratory Syncytial Virus NOT DETECTED NOT DETECTED Final   Bordetella pertussis NOT DETECTED NOT DETECTED Final   Chlamydophila pneumoniae NOT DETECTED NOT DETECTED Final   Mycoplasma pneumoniae NOT DETECTED NOT DETECTED Final    Comment: Performed at [facility], [place] [address] [place], [place] [address]  MRSA PCR Screening     Status: None   Collection Time: 02/26/18 12:53 PM  Result Value Ref Range Status   MRSA by PCR NEGATIVE NEGATIVE Final    Comment:        The GeneXpert MRSA Assay (FDA approved for NASAL specimens only), is one component of a comprehensive MRSA colonization surveillance program. It is not intended to diagnose MRSA infection nor to guide or monitor treatment for MRSA infections. Performed at [facility], [address] [place], [place] [address]    Studies/Results: Dg Chest Port 1 View  Result Date: 03/01/2018 CLINICAL DATA:  Respiratory failure. EXAM: PORTABLE CHEST 1 VIEW COMPARISON:  Radiograph of February 26, 2018. FINDINGS: Stable cardiomegaly. Sternotomy wires are noted. No pneumothorax is noted. Left lung is clear. Improved bilateral perihilar opacities are noted suggesting improving edema or inflammation. Bony thorax is unremarkable. IMPRESSION: Improving bilateral perihilar edema or inflammation. Electronically Signed   By: Marijo Conception, M.D.   On: 03/01/2018 11:39    Medications:  Prior to Admission:  Medications Prior to Admission  Medication Sig Dispense Refill Last Dose  .  acetaminophen (TYLENOL) 650 MG CR tablet Take 1,300 mg by mouth every 8 (eight) hours as needed for pain.   02/23/2018 at  Unknown time  . amLODipine (NORVASC) 10 MG tablet Take 10 mg by mouth daily.   02/23/2018 at Unknown time  . atorvastatin (LIPITOR) 80 MG tablet Take 40 mg by mouth every evening.    02/23/2018 at Unknown time  . atropine 1 % ophthalmic solution Place 1 drop into the left eye daily.    02/23/2018 at Unknown time  . Brinzolamide-Brimonidine (SIMBRINZA) 1-0.2 % SUSP Apply 1 drop to eye 2 (two) times daily.   02/23/2018 at Unknown time  . carboxymethylcellulose (REFRESH PLUS) 0.5 % SOLN Place 1 drop into both eyes 4 (four) times daily as needed.   02/23/2018 at Unknown time  . dipyridamole-aspirin (AGGRENOX) 200-25 MG per 12 hr capsule Take 1 capsule by mouth 2 (two) times daily.   02/23/2018 at Unknown time  . folic acid (FOLVITE) 1 MG tablet Take 1 mg by mouth.   02/23/2018 at Unknown time  . furosemide (LASIX) 20 MG tablet Take 1 tablet (20 mg total) by mouth daily. 30 tablet 0 02/23/2018 at Unknown time  . insulin aspart protamine- aspart (NOVOLOG MIX 70/30) (70-30) 100 UNIT/ML injection Inject into the skin. Patient takes 40 units in the morning and 50 units at night.   02/24/2018 at Unknown time  . Ipratropium-Albuterol (COMBIVENT RESPIMAT) 20-100 MCG/ACT AERS respimat Inhale 1 puff into the lungs every 6 (six) hours.   02/23/2018 at Unknown time  . ketorolac (ACULAR) 0.5 % ophthalmic solution Place 1 drop into the right eye 4 (four) times daily.    02/23/2018 at Unknown time  . losartan (COZAAR) 100 MG tablet Take 100 mg by mouth daily.     02/23/2018 at Unknown time  . metFORMIN (GLUCOPHAGE) 1000 MG tablet Take 1,000 mg by mouth 2 (two) times daily with a meal.     02/24/2018 at Unknown time  . metoprolol (LOPRESSOR) 50 MG tablet Take 0.5 tablets (25 mg total) by mouth 2 (two) times daily. FOR HEART/BLOOD PRESSURE. HOLD FOR SYSTOLIC BLOOD PRESSURE LESS THAN 1OO/HEART RATE LESS THAN 60   02/23/2018 at 8:30am  . pioglitazone (ACTOS) 30 MG tablet Take 30 mg by mouth daily.    02/24/2018 at Unknown time  .  potassium chloride (K-DUR,KLOR-CON) 10 MEQ tablet Take 2 tablets (20 mEq total) by mouth daily. Take with lasix 60 tablet 2 02/23/2018 at Unknown time  . spironolactone (ALDACTONE) 25 MG tablet Take 25 mg by mouth daily.   02/23/2018 at Unknown time  . tamsulosin (FLOMAX) 0.4 MG CAPS capsule Take 2 capsules (0.8 mg total) by mouth every evening. 60 capsule 3 02/23/2018 at Unknown time  . timolol (BETIMOL) 0.5 % ophthalmic solution Place 1 drop into the left eye 2 (two) times daily.   02/23/2018 at Unknown time  . Adalimumab 80 MG/0.8ML & 40MG/0.4ML PNKT Inject 1 Dose into the skin every 14 (fourteen) days.   02/20/2018  . clotrimazole-betamethasone (LOTRISONE) cream Apply 1 application topically 2 (two) times daily. (Patient taking differently: Apply 1 application topically as needed. ) 30 g 1 unknown  . methotrexate (RHEUMATREX) 2.5 MG tablet Take 20 mg by mouth once a week.   02/17/2018   Scheduled: . atorvastatin  40 mg Oral QPM  . atropine  1 drop Left Eye Daily  . dipyridamole-aspirin  1 capsule Oral BID  . doxycycline  100 mg Oral Q12H  . enoxaparin (LOVENOX) injection  60 mg Subcutaneous Q24H  . furosemide  20 mg Oral Daily  . insulin aspart  0-15 Units Subcutaneous TID WC  . insulin aspart  0-5 Units Subcutaneous QHS  . insulin aspart  8 Units Subcutaneous TID WC  . insulin glargine  25 Units Subcutaneous QHS  . ipratropium-albuterol  3 mL Nebulization Q6H  . ketorolac  1 drop Right Eye QID  . methylPREDNISolone (SOLU-MEDROL) injection  40 mg Intravenous Q12H  . metoprolol tartrate  25 mg Oral BID  . potassium chloride  20 mEq Oral Daily  . tamsulosin  0.8 mg Oral QPM  . timolol  1 drop Left Eye BID   Continuous:  HSV:[id number] **OR** acetaminophen, clotrimazole-betamethasone, LORazepam, polyvinyl alcohol, senna-docusate, sodium chloride flush  Assesment: He was admitted with sepsis from pneumonia.  He is markedly better.  He is on nasal cannula now.  Chest x-ray yesterday which  I personally reviewed is much improved and he will need follow-up chest x-ray as an outpatient to make sure that it totally clears. Principal Problem:   Sepsis ([place]) Active Problems:   HTN (hypertension)   DM (diabetes mellitus) type II controlled with renal manifestation (H[facility])   CAD (coronary artery disease)   Obesity, Class III, BMI 40-49.9 (morbid obesity) ([place])   History of stroke   OSA (obstructive sleep apnea)   Dyspnea   Lactic acidosis   Acute-on-chronic kidney injury ([place])    Plan: I will plan to follow more peripherally    LOS: 6 days   [name] L 03/02/2018, 9:12 AM

## 2018-03-02 NOTE — Care Management Note (Signed)
Case Management Note  Patient Details  Name: [name] MRN: [id number] Date of Birth: 1941/09/14  If discussed at [place] Length of Stay Meetings, dates discussed:  03/02/2018  Additional Comments:  Seniah Lawrence, Chauncey Reading, RN 03/02/2018, 12:05 PM

## 2018-03-03 DIAGNOSIS — R0600 Dyspnea, unspecified: Secondary | ICD-10-CM

## 2018-03-03 DIAGNOSIS — E872 Acidosis: Secondary | ICD-10-CM

## 2018-03-03 DIAGNOSIS — R0902 Hypoxemia: Secondary | ICD-10-CM

## 2018-03-03 LAB — BASIC METABOLIC PANEL
Anion gap: 10 (ref 5–15)
BUN: 20 mg/dL (ref 6–20)
CO2: 25 mmol/L (ref 22–32)
Calcium: 7.8 mg/dL — ABNORMAL LOW (ref 8.9–10.3)
Chloride: 105 mmol/L (ref 101–111)
Creatinine, Ser: 1.14 mg/dL (ref 0.61–1.24)
GFR calc Af Amer: >60 mL/min (ref >60)
GFR calc non Af Amer: >60 mL/min (ref >60)
Glucose, Bld: 260 mg/dL — ABNORMAL HIGH (ref 65–99)
Potassium: 4.3 mmol/L (ref 3.5–5.1)
Sodium: 140 mmol/L (ref 135–145)

## 2018-03-03 LAB — GLUCOSE, CAPILLARY
Glucose-Capillary: 264 mg/dL — ABNORMAL HIGH (ref 65–99)
Glucose-Capillary: 268 mg/dL — ABNORMAL HIGH (ref 65–99)

## 2018-03-03 MED ORDER — PREDNISONE 20 MG PO TABS: ORAL_TABLET | ORAL | 0 refills | Status: DC

## 2018-03-03 NOTE — Progress Notes (Signed)
SATURATION QUALIFICATIONS: (This note is used to comply with regulatory documentation for home oxygen)  Patient Saturations on Room Air at Rest = 99%  Patient Saturations on Room Air while Ambulating = 93%  Patient Saturations on N/A Liters of oxygen while Ambulating = N/A%  Please briefly explain why patient needs home oxygen:

## 2018-03-03 NOTE — Discharge Summary (Signed)
Physician Discharge Summary  [name]:[id number] DOB: 04-13-1941 DOA: 02/24/2018  PCP: Alycia Rossetti, MD  Admit date: 02/24/2018 Discharge date: 03/03/2018  Time spent: 35 minutes  Recommendations for Outpatient Follow-up:  1. Repeat basic metabolic panel to follow electrolytes and renal function 2. This is blood pressure and further adjust antihypertensive regimen as needed 3. Please follow complete resolution of patient's shortness of breath pursued further work-up as recommended (PFTs, rheumatologic work-up follow-up with pulmonary service).   Discharge Diagnoses:  Principal Problem:   Sepsis ([place]) Active Problems:   HTN (hypertension)   DM (diabetes mellitus) type II controlled with renal manifestation (H[facility])   CAD (coronary artery disease)   Obesity, Class III, BMI 40-49.9 (morbid obesity) ([place])   History of stroke   OSA (obstructive sleep apnea)   Dyspnea   Lactic acidosis   Acute-on-chronic kidney injury ([place])   Hypoxia   Discharge Condition: Stable and improved.  Patient discharged home with instruction to follow-up with PCP in 10 days.  Diet recommendation: Heart healthy diet and modify carbohydrates.  Filed Weights   02/27/18 0413 02/28/18 0500 03/01/18 0500  Weight: 127.9 kg (281 lb 15.5 oz) 127.6 kg (281 lb 4.9 oz) 126.9 kg (279 lb 12.2 oz)    History of present illness:  77 year old male with morbid obesity, OSA, diabetes mellitus, hypertension, CAD status post CABG recently hospitalized from 4/22-4/23 with worsening dyspnea with work-up done including echo, CT angiogram of the chest, blood cultures and troponin which were unremarkable. Patient was discharged home with the impression that this was related to OHS with recommendations to follow-up with PCP and obtain outpatient PFT. Patient reports that he was fine for a few days but had acute worsening of his symptoms with shortness of breath on minimal exertion. At baseline he only walks about 10-15 feet  limited by dyspnea. Denies any wheezing but had some productive cough. Denied any fevers at home. In the ED was septic with fever of 101.3 F, hypoxic in the high 80s on room air and a WBC of 10.8 (was normal on recent discharge). He also had elevated lactic acid of 2.8, normal BNP, UA and chest x-ray without any infiltrate. He had a repeat CT angiogram of the chest which was negative for PE with questionable bilateral hazy lung densities and some mediastinal hilar adenopathy. Patient placed on empiric vancomycin and Zosyn for sepsis and given fluid bolus in the ED. Patient admitted for further management of sepsis.  Hospital Course:  1-sepsis: Presumed to be secondary to pneumonia based on x-ray findings. -Sepsis features resolved by now -Patient breathing back to his baseline and no requiring oxygen supplementation at discharge -No fever and non toxic in appearance. -patient completed antibiotic therapy while inpatient  -continue steroids tapering and the use of PRN combivent   2-acute respiratory failure with hypoxia -Most likely associated problem #1 -Patient with underlying obstructive sleep apnea and obesity hypoventilation syndrome -Patient with significant improvement after treatment provided at discharge not requiring any oxygen supplementation. -Has been instructed to continue the use of CPAP nightly -He will definitely benefit of PFTs and if needed rheumatology work-up as an outpatient.    3-acute kidney injury on chronic kidney disease stage III -Renal function back to his baseline, actually better than baseline  -safe to resume/Continue lasix, ARB and Aldactone -Follow-up renal function trend at follow up visit  4-obstructive sleep apnea/obesity hypoventilation syndrome -Continue CPAP nightly -Patient will benefit of outpatient PFTs. -Encouraged to lose weight.  5-essential hypertension -Blood  pressure stable and well-controlled -Will resume home antihypertensive  regimen of discharge. -Patient advised to follow heart healthy/low-sodium diet.  6-type 2 diabetes mellitus with nephropathy -Expect his CBGs to be slightly elevated while using steroids -Resume home hypoglycemic regimen -Outpatient follow-up of his CBGs and A1c for further adjustment on his hypoglycemic regimen as needed. -Patient advised to follow a low carbohydrate diet.  7-hyperlipidemia -Continue statins.    8-BPH -Currently no complaining of any urinary retention symptoms -Continue Flomax.  9-morbid obesity  -Body mass index is 42.54 kg/m. 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   Procedures:  See below for x-ray reports.  Consultations:  Pulmonary service  Discharge Exam: Vitals:   03/03/18 1300 03/03/18 1325  BP: (!) 150/72   Pulse: 64   Resp: 19   Temp: 98.2 F (36.8 C)   SpO2: 98% 97%    General: No fever, no chest pain, no nausea, no vomiting.  Patient with significant improvement in his breathing and no longer requiring oxygen supplementation. Reports skin itching is also better.   Cardiovascular: S1 and S2, no rubs, no gallops, no murmurs  Respiratory:  No significant wheezing, scattered rhonchi, no crackles.  Improved air movement bilaterally.  No using accessory muscles.      Abdomen:  Obese, soft, nontender, nondistended, positive bowel sounds.    Musculoskeletal: Trace edema, no cyanosis, no clubbing.     Discharge Instructions   Discharge Instructions    (HEART FAILURE PATIENTS) Call MD:  Anytime you have any of the following symptoms: 1) 3 pound weight gain in 24 hours or 5 pounds in 1 week 2) shortness of breath, with or without a dry hacking cough 3)  swelling in the hands, feet or stomach 4) if you have to sleep on extra pillows at night in order to breathe.   Complete by:  As directed    Diet - low sodium heart healthy   Complete by:  As directed    Diet Carb Modified   Complete by:  As directed    Discharge instructions   Complete by:  As directed    Take medications as prescribed Follow-up with PCP in 10 days Keep yourself well-hydrated, follow low-sodium diet and check your weight on daily basis.     Allergies as of 03/03/2018   No Known Allergies     Medication List    STOP taking these medications   amLODipine 10 MG tablet Commonly known as:  NORVASC     TAKE these medications   acetaminophen 650 MG CR tablet Commonly known as:  TYLENOL Take 1,300 mg by mouth every 8 (eight) hours as needed for pain.   Adalimumab 80 MG/0.8ML & 40MG /0.4ML Pnkt Inject 1 Dose into the skin every 14 (fourteen) days.   atorvastatin 80 MG tablet Commonly known as:  LIPITOR Take 40 mg by mouth every evening.   atropine 1 % ophthalmic solution Place 1 drop into the left eye daily.   carboxymethylcellulose 0.5 % Soln Commonly known as:  REFRESH PLUS Place 1 drop into both eyes 4 (four) times daily as needed.   clotrimazole-betamethasone cream Commonly known as:  LOTRISONE Apply 1 application topically 2 (two) times daily. What changed:    when to take this  reasons to take this  COMBIVENT RESPIMAT 20-100 MCG/ACT Aers respimat Generic drug:  Ipratropium-Albuterol Inhale 1 puff into the lungs every 6 (six) hours.   dipyridamole-aspirin 200-25 MG 12hr capsule Commonly known as:  AGGRENOX Take 1 capsule by mouth 2 (two) times daily.   folic acid 1 MG tablet Commonly known as:  FOLVITE Take 1 mg by mouth.   furosemide 20 MG tablet Commonly known as:  LASIX Take 1 tablet (20 mg total) by mouth daily.   insulin aspart protamine- aspart (70-30) 100 UNIT/ML injection Commonly known as:  NOVOLOG MIX 70/30 Inject into the  skin. Patient takes 40 units in the morning and 50 units at night.   ketorolac 0.5 % ophthalmic solution Commonly known as:  ACULAR Place 1 drop into the right eye 4 (four) times daily.   losartan 100 MG tablet Commonly known as:  COZAAR Take 100 mg by mouth daily.   metFORMIN 1000 MG tablet Commonly known as:  GLUCOPHAGE Take 1,000 mg by mouth 2 (two) times daily with a meal.   methotrexate 2.5 MG tablet Commonly known as:  RHEUMATREX Take 20 mg by mouth once a week.   metoprolol tartrate 50 MG tablet Commonly known as:  LOPRESSOR Take 0.5 tablets (25 mg total) by mouth 2 (two) times daily. FOR HEART/BLOOD PRESSURE. HOLD FOR SYSTOLIC BLOOD PRESSURE LESS THAN 1OO/HEART RATE LESS THAN 60   pioglitazone 30 MG tablet Commonly known as:  ACTOS Take 30 mg by mouth daily.   potassium chloride 10 MEQ tablet Commonly known as:  K-DUR,KLOR-CON Take 2 tablets (20 mEq total) by mouth daily. Take with lasix   predniSONE 20 MG tablet Commonly known as:  DELTASONE 2 tablets by mouth daily x2 days, obtain 1 tablet by mouth daily x3 days; then half tablet by mouth daily x3 days and then stop prednisone.   SIMBRINZA 1-0.2 % Susp Generic drug:  Brinzolamide-Brimonidine Apply 1 drop to eye 2 (two) times daily.   spironolactone 25 MG tablet Commonly known as:  ALDACTONE Take 25 mg by mouth daily.   tamsulosin 0.4 MG Caps capsule Commonly known as:  FLOMAX Take 2 capsules (0.8 mg total) by mouth every evening.   timolol 0.5 % ophthalmic solution Commonly known as:  BETIMOL Place 1 drop into the left eye 2 (two) times daily.      No Known Allergies Follow-up Information    [place], Modena Nunnery, MD. Schedule an appointment as soon as possible for a visit in 10 day(s).   Specialty:  Family Medicine Contact information: [address], [place] [address] [phone]           The results of significant diagnostics from this hospitalization (including imaging,  microbiology, ancillary and laboratory) are listed below for reference.    Significant Diagnostic Studies: Ct Abdomen Pelvis Wo Contrast  Result Date: 02/26/2018 CLINICAL DATA:  Sepsis. EXAM: CT ABDOMEN AND PELVIS WITHOUT CONTRAST TECHNIQUE: Multidetector CT imaging of the abdomen and pelvis was performed following the standard protocol without IV contrast. COMPARISON:  CT abdomen pelvis dated February 25, 2007. FINDINGS: Lower chest: Mild ground-glass density at the lung bases, unchanged. Hepatobiliary: Hepatic steatosis. Prior cholecystectomy. No biliary dilatation. Pancreas: Unremarkable. No pancreatic ductal dilatation or surrounding inflammatory changes. Spleen: Normal in size without focal abnormality. Adrenals/Urinary Tract: The adrenal glands are unremarkable. Interval increase in size of the 3.2 cm simple cyst arising from the upper pole of the left kidney. Punctate left renal calculi. No ureteral calculi or hydronephrosis. The bladder is unremarkable. Stomach/Bowel:  Stomach is within normal limits. Appendix appears normal. No evidence of bowel wall thickening, distention, or inflammatory changes. There are few scattered colonic diverticula. Vascular/Lymphatic: Aortic atherosclerosis. No enlarged abdominal or pelvic lymph nodes. Reproductive: Prostate is unremarkable. Other: No free fluid or pneumoperitoneum. Musculoskeletal: No acute or significant osseous findings. Chronic mild compression deformity of T12. IMPRESSION: 1.  No acute intra-abdominal process. 2. Hepatic steatosis. 3. Punctate left nephrolithiasis. 4. Mild ground-glass density at the lung bases is unchanged and may reflect edema or infection. 5.  Aortic atherosclerosis (ICD10-I70.0).  Normal Electronically Signed   By: Titus Dubin M.D.   On: 02/26/2018 19:18   Dg Chest 2 View  Result Date: 02/24/2018 CLINICAL DATA:  Shortness of breath EXAM: CHEST - 2 VIEW COMPARISON:  February 19, 2018 chest radiograph and chest CT February 20, 2018  FINDINGS: There is no appreciable edema or consolidation. Heart is borderline enlarged with pulmonary vascular within normal limits. Patient is status post median sternotomy. There are surgical clips in the right upper thoracic region medially. There is no adenopathy appreciable by radiography. Adenopathy seen on recent CT is not appreciable by radiography. There is anterior wedging of T12 vertebral body, stable. IMPRESSION: Cardiac prominence is stable. No frank edema or consolidation. Areas of postoperative change noted. Electronically Signed   By: Lowella Grip III M.D.   On: 02/24/2018 10:04   Dg Chest 2 View  Result Date: 02/19/2018 CLINICAL DATA:  SOB for several days, legs swelling EXAM: CHEST - 2 VIEW COMPARISON:  08/05/2017 FINDINGS: Status post median sternotomy. Heart is UPPER normal in size. There is mild perihilar vascular prominence. No overt edema. No focal consolidations or pleural effusions. There is wedge deformity of numerous LOWER thoracic and UPPER lumbar vertebral bodies, stable in appearance. IMPRESSION: 1. Cardiomegaly and mild edema. 2. 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 3.  Recommend DXA bone density exam if not previously performed. Electronically Signed   By: Nolon Nations M.D.   On: 02/19/2018 10:05   Ct Angio Chest Pe W/cm &/or Wo Cm  Result Date: 02/24/2018 CLINICAL DATA:  Shortness of breath fever EXAM: CT ANGIOGRAPHY CHEST WITH CONTRAST TECHNIQUE: Multidetector CT imaging of the chest was performed using the standard protocol during bolus administration of intravenous contrast. Multiplanar CT image reconstructions and MIPs were obtained to evaluate the vascular anatomy. CONTRAST:  75 mL Isovue 370 intravenous COMPARISON:  Chest x-ray 02/24/2018, CT chest 02/20/2018 FINDINGS: Cardiovascular: Limited opacification of pulmonary arterial system. No gross acute central filling defects are seen. Nonaneurysmal aorta. Mild aortic  atherosclerosis. Coronary vascular calcification. No pericardial effusion. Mediastinum/Nodes: Midline trachea. Coarse calcification in the left lobe of the thyroid. Multiple enlarged mediastinal lymph nodes. Right paratracheal lymph node measures 13 mm. Left perihilar lymph node measures 18 mm. Right hilar lymph node measures 18 mm. Subcarinal lymph node nodes measure up to 2 cm. Esophagus within normal limits. Post CABG changes. Lungs/Pleura: Scattered areas of mild hazy density. No consolidation or pleural effusion. Upper Abdomen: Cyst upper pole left kidney.  No acute abnormality Musculoskeletal: No chest wall abnormality. No acute or significant osseous findings. Sternotomy. Review of the MIP images confirms the above findings. IMPRESSION: 1. Suboptimal contrast opacification which limits evaluation for pulmonary embolus. No gross central embolus is seen. 2. No focal pulmonary consolidations. Mild bilateral hazy lung densities which could be due to small airways disease or minimal edema. 3. Mediastinal and hilar adenopathy, possibly reactive but consider short interval CT follow-up in 3-6 months to  exclude lymphoproliferative abnormality. Aortic Atherosclerosis (ICD10-I70.0). Electronically Signed   By: Donavan Foil M.D.   On: 02/24/2018 14:12   Ct Angio Chest Pe W And/or Wo Contrast  Result Date: 02/20/2018 CLINICAL DATA:  Shortness of breath for 1 month. EXAM: CT ANGIOGRAPHY CHEST WITH CONTRAST TECHNIQUE: Multidetector CT imaging of the chest was performed using the standard protocol during bolus administration of intravenous contrast. Multiplanar CT image reconstructions and MIPs were obtained to evaluate the vascular anatomy. CONTRAST:  116mL ISOVUE-370 IOPAMIDOL (ISOVUE-370) INJECTION 76% COMPARISON:  Abdominal CT 02/24/2017 FINDINGS: Cardiovascular: Satisfactory opacification of the pulmonary arteries to the segmental level. No evidence of pulmonary embolism. Normal heart size. No pericardial  effusion. Extensive atherosclerotic calcification of the coronaries, status post CABG. No acute aortic finding. Mediastinum/Nodes: Symmetric gynecomastia. Right hemithyroidectomy. Subcentimeter nodule in the lower pole left thyroid. Right hilar adenopathy that is asymmetric. Sub-carinal adenopathy measuring 22 mm short axis. These were not seen on a 02/25/2007 abdominal CT which covered much of the lower mediastinum. Lungs/Pleura: There is no edema, consolidation, effusion, or pneumothorax. Mild fissure thickening and occasional interlobular septal thickening which is likely atelectatic. No definite edema. Upper Abdomen: Cholecystectomy. Left upper pole renal cystic density. Musculoskeletal: No acute or aggressive finding. Review of the MIP images confirms the above findings. IMPRESSION: 1. Negative for pulmonary embolism or other acute finding. 2. Right hilar and subcarinal adenopathy not seen on a 2008 abdominal CT. No explainable findings on today's scan. If there is no explainable chronic lung disease or CHF on clinical grounds a follow-up CT could be obtained in 3-6 months. 3. Aortic Atherosclerosis (ICD10-I70.0). Electronically Signed   By: Monte Fantasia M.D.   On: 02/20/2018 09:26   Dg Chest Port 1 View  Result Date: 03/01/2018 CLINICAL DATA:  Respiratory failure. EXAM: PORTABLE CHEST 1 VIEW COMPARISON:  Radiograph of February 26, 2018. FINDINGS: Stable cardiomegaly. Sternotomy wires are noted. No pneumothorax is noted. Left lung is clear. Improved bilateral perihilar opacities are noted suggesting improving edema or inflammation. Bony thorax is unremarkable. IMPRESSION: Improving bilateral perihilar edema or inflammation. Electronically Signed   By: Marijo Conception, M.D.   On: 03/01/2018 11:39   Dg Chest Port 1 View  Result Date: 02/26/2018 CLINICAL DATA:  Hypoxia. EXAM: PORTABLE CHEST 1 VIEW COMPARISON:  Chest x-ray and CT chest dated February 24, 2018. FINDINGS: Stable cardiomegaly. Worsened bilateral  perihilar alveolar and interstitial opacities, greater on the right. No pneumothorax or pleural effusion. No acute osseous abnormality. IMPRESSION: Worsening perihilar airspace disease, favored to reflect pulmonary edema. Infection could have a similar appearance. Electronically Signed   By: Titus Dubin M.D.   On: 02/26/2018 17:50    Microbiology: Recent Results (from the past 240 hour(s))  Urine culture     Status: None   Collection Time: 02/24/18 10:47 AM  Result Value Ref Range Status   Specimen Description   Final    URINE, CLEAN CATCH Performed at [facility], [address], [place], [place] [address]    Special Requests   Final    NONE Performed at [facility], [address], [place], [place] [address]    Culture   Final    NO GROWTH Performed at [facility], [place] [address], [place], [place] [address]    Report Status 02/26/2018 FINAL  Final  Culture, blood (routine x 2)     Status: None   Collection Time: 02/24/18 11:03 AM  Result Value Ref Range Status   Specimen Description BLOOD BLOOD  LEFT HAND  Final   Special Requests   Final    BOTTLES DRAWN AEROBIC AND ANAEROBIC Blood Culture adequate volume   Culture   Final    NO GROWTH 5 DAYS Performed at [facility], [address] [place], [place] [address]    Report Status 03/01/2018 FINAL  Final  Culture, blood (routine x 2)     Status: Abnormal   Collection Time: 02/24/18 11:03 AM  Result Value Ref Range Status   Specimen Description   Final    BLOOD BLOOD LEFT HAND Performed at [facility], [address], [place], [name] [address]    Special Requests   Final    BOTTLES DRAWN AEROBIC AND ANAEROBIC Blood Culture adequate volume Performed at [facility], [address], [place], [place] [address]    Culture  Setup Time   Final    GRAM POSITIVE COCCI Gram Stain Report Called to,Read Back By and Verified With: DILDY @ 0802 ON [id number] BY HENDERSON L.  ANAEROBIC BOTTLE ONLY CRITICAL RESULT CALLED TO, READ  BACK BY AND VERIFIED WITH: S HURTH,PHARMD AT 1152 02/26/18 BY L BENFIELD    Culture (A)  Final    MICROCOCCUS SPECIES Standardized susceptibility testing for this organism is not available. Performed at [facility], [place] [address], [place], [place] [address]    Report Status 02/28/2018 FINAL  Final  Blood Culture ID Panel (Reflexed)     Status: None   Collection Time: 02/24/18 11:03 AM  Result Value Ref Range Status   Enterococcus species NOT DETECTED NOT DETECTED Final   Listeria monocytogenes NOT DETECTED NOT DETECTED Final   Staphylococcus species NOT DETECTED NOT DETECTED Final   Staphylococcus aureus NOT DETECTED NOT DETECTED Final   Streptococcus species NOT DETECTED NOT DETECTED Final   Streptococcus agalactiae NOT DETECTED NOT DETECTED Final   Streptococcus pneumoniae NOT DETECTED NOT DETECTED Final   Streptococcus pyogenes NOT DETECTED NOT DETECTED Final   Acinetobacter baumannii NOT DETECTED NOT DETECTED Final   Enterobacteriaceae species NOT DETECTED NOT DETECTED Final   Enterobacter cloacae complex NOT DETECTED NOT DETECTED Final   Escherichia coli NOT DETECTED NOT DETECTED Final   Klebsiella oxytoca NOT DETECTED NOT DETECTED Final   Klebsiella pneumoniae NOT DETECTED NOT DETECTED Final   Proteus species NOT DETECTED NOT DETECTED Final   Serratia marcescens NOT DETECTED NOT DETECTED Final   Haemophilus influenzae NOT DETECTED NOT DETECTED Final   Neisseria meningitidis NOT DETECTED NOT DETECTED Final   Pseudomonas aeruginosa NOT DETECTED NOT DETECTED Final   Candida albicans NOT DETECTED NOT DETECTED Final   Candida glabrata NOT DETECTED NOT DETECTED Final   Candida krusei NOT DETECTED NOT DETECTED Final   Candida parapsilosis NOT DETECTED NOT DETECTED Final   Candida tropicalis NOT DETECTED NOT DETECTED Final    Comment: Performed at [facility], [place] [address], [place],  [address]  Respiratory Panel by PCR     Status: None   Collection Time:  02/24/18 10:00 PM  Result Value Ref Range Status   Adenovirus NOT DETECTED NOT DETECTED Final   Coronavirus 229E NOT DETECTED NOT DETECTED Final   Coronavirus HKU1 NOT DETECTED NOT DETECTED Final   Coronavirus NL63 NOT DETECTED NOT DETECTED Final   Coronavirus OC43 NOT DETECTED NOT DETECTED Final   Metapneumovirus NOT DETECTED NOT DETECTED Final   Rhinovirus / Enterovirus NOT DETECTED NOT DETECTED Final   Influenza A NOT DETECTED NOT DETECTED Final   Influenza B NOT DETECTED NOT DETECTED  Final   Parainfluenza Virus 1 NOT DETECTED NOT DETECTED Final   Parainfluenza Virus 2 NOT DETECTED NOT DETECTED Final   Parainfluenza Virus 3 NOT DETECTED NOT DETECTED Final   Parainfluenza Virus 4 NOT DETECTED NOT DETECTED Final   Respiratory Syncytial Virus NOT DETECTED NOT DETECTED Final   Bordetella pertussis NOT DETECTED NOT DETECTED Final   Chlamydophila pneumoniae NOT DETECTED NOT DETECTED Final   Mycoplasma pneumoniae NOT DETECTED NOT DETECTED Final    Comment: Performed at [facility], [place] [address] [place], [place] [address]  MRSA PCR Screening     Status: None   Collection Time: 02/26/18 12:53 PM  Result Value Ref Range Status   MRSA by PCR NEGATIVE NEGATIVE Final    Comment:        The GeneXpert MRSA Assay (FDA approved for NASAL specimens only), is one component of a comprehensive MRSA colonization surveillance program. It is not intended to diagnose MRSA infection nor to guide or monitor treatment for MRSA infections. Performed at [facility], [address] [place], [place] [address]      Labs: Basic Metabolic Panel: Recent Labs  Lab 02/25/18 0824 02/26/18 0835 02/27/18 0500 03/01/18 0435 03/03/18 0514  NA 136 139 137 139 140  K 4.7 4.3 4.4 4.7 4.3  CL 102 103 103 107 105  CO2 21* 24 22 22 25   GLUCOSE 142* 116* 206* 275* 260*  BUN 19 21* 22* 26* 20  CREATININE 1.40* 1.66* 1.69* 1.29* 1.14  CALCIUM 8.1* 8.2* 7.9* 8.0* 7.8*   CBC: Recent Labs  Lab  02/25/18 0824  WBC 11.4*  HGB 9.5*  HCT 30.6*  MCV 99.4  PLT 349    BNP (last 3 results) Recent Labs    02/15/18 1112 02/19/18 0855 02/24/18 1103  BNP 87 62.0 54.0   CBG: Recent Labs  Lab 03/02/18 1118 03/02/18 1626 03/02/18 2148 03/03/18 0756 03/03/18 1204  GLUCAP 299* 263* 331* 264* 268*    Signed:  Barton Dubois MD.  [facility] 03/03/2018, 2:59 PM

## 2018-03-03 NOTE — Care Management Note (Signed)
Case Management Note  Patient Details  Name: [name] MRN: [id number] Date of Birth: [birth year]  Expected Discharge Date:                  Expected Discharge Plan:  Home/Self Care  In-House Referral:  NA  Discharge planning Services  CM Consult  Post Acute Care Choice:  NA Choice offered to:  NA  Status of Service:  Completed, signed off  If discussed at Long Length of Stay Meetings, dates discussed:    Additional Comments: DC home today with self care. Pt does not meet requirements for home O2. He ambulated in hall with RW. Wife at bedside. No CM needs noted or communicated at DC.   Sherald Barge, RN 03/03/2018, 11:45 AM

## 2018-03-03 NOTE — Progress Notes (Signed)
Pt discharged home today per Dr Madera. Pt's IV site D/C'd and WDL. Pt's VSS. Pt provided with home medication list, discharge instructions and prescriptions. Verbalized understanding. Pt left floor via WC in stable condition accompanied by NT. 

## 2018-03-03 NOTE — Care Management Important Message (Signed)
Important Message  Patient Details  Name: [name] MRN: [id number] Date of Birth: June 04, 1941   Medicare Important Message Given:  Yes    Shelda Altes 03/03/2018, 2:13 PM

## 2018-03-13 ENCOUNTER — Encounter: Payer: Self-pay | Admitting: Family Medicine

## 2018-03-13 ENCOUNTER — Other Ambulatory Visit: Payer: Self-pay

## 2018-03-13 ENCOUNTER — Ambulatory Visit (INDEPENDENT_AMBULATORY_CARE_PROVIDER_SITE_OTHER): Payer: Medicare HMO | Admitting: Family Medicine

## 2018-03-13 VITALS — BP 132/72 | HR 64 | Temp 98.8°F | Resp 14 | Ht 68.0 in | Wt 269.0 lb

## 2018-03-13 DIAGNOSIS — I5032 Chronic diastolic (congestive) heart failure: Secondary | ICD-10-CM | POA: Diagnosis not present

## 2018-03-13 DIAGNOSIS — R0609 Other forms of dyspnea: Secondary | ICD-10-CM

## 2018-03-13 DIAGNOSIS — J189 Pneumonia, unspecified organism: Secondary | ICD-10-CM | POA: Diagnosis not present

## 2018-03-13 DIAGNOSIS — E1122 Type 2 diabetes mellitus with diabetic chronic kidney disease: Secondary | ICD-10-CM | POA: Diagnosis not present

## 2018-03-13 DIAGNOSIS — N183 Chronic kidney disease, stage 3 unspecified: Secondary | ICD-10-CM

## 2018-03-13 DIAGNOSIS — R0600 Dyspnea, unspecified: Secondary | ICD-10-CM

## 2018-03-13 DIAGNOSIS — Z794 Long term (current) use of insulin: Secondary | ICD-10-CM | POA: Diagnosis not present

## 2018-03-13 NOTE — Patient Instructions (Addendum)
Stop Actos Continue all other medications You need a  Referral Rheumatologist to look at connective tissue disorders You need a Referral a Pulmonary- need PFT and continued evaluation of Shortness of breath F/U as previous

## 2018-03-13 NOTE — Assessment & Plan Note (Signed)
Currently compensated.  Continue the diuretics as well as potassium.

## 2018-03-13 NOTE — Progress Notes (Signed)
Subjective:    Patient ID: [name], male    DOB: Feb 28, 1941, 77 y.o.   MRN: [id number]  Patient presents for Hospital F/u (SOB- PNA)  Here for hospital follow-up was seen on 417 with having some increased work of breathing however nothing specific was found on his labs or imaging is not in fluid overload.  He went to the emergency room on 521 they did work-up did not find any specific reason for his shortness of breath chest x-ray was negative EKG unremarkable.  He went back to the hospital on 4/22 was admitted overnight for evaluation had CT angiogram chest blood cultures troponin echo which were all unremarkable..  There was no sign of infection no sign of fluid overload he was continued on his CPAP for obstructive sleep apnea.  There are no medication change made.  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  He went home for couple days but his shortness of breath did not improve.  He was admitted to the hospital again on 4/26. This time when he presented he was found to have a fever of 101.3 he was also hypoxic in the high 80s and had a white blood cell count of 10.8.  He had elevated lactic acid level 2.8 his chest x-ray did not show any specific infiltrate he had a CT angiogram of the chest which was negative for PE but had some questionable hazy densities in hilar adenopathy therefore he was started on empiric vancomycin and Zosyn for probable pneumonia/sepsis.  He completed antibiotic therapy while he was inpatient he was also given steroids and as needed Combivent.  He was supplemented with oxygen during his admission but did not require at discharge discontinued on his CPAP.  Acute kidney injury he was back to baseline with IV fluids he resumed his home medications.  Hypertension resumed on his antihypertensive medications at discharge.  Diabetes mellitus he was given  sliding scale insulin on top of his home regimen while he was admitted Last A1c 7% on April 22   Hospital records in detail.  He does need pulmonary function tests as well as rheumatologic work-up and follow-up with pulmonary as recommended.  Also needs repeat metabolic panel  He did break out in a rash the day before he left the hospital.  Was told that it was due to the sheets he had a few red bumps but then this spread all across his back his upper arms his chest with more like some type of hives per his wife.  He was on prednisone which she did complete also use topical cream now he does have peeling and hyperpigmented scarring all over.  He still has some itching  His breathing is much better but he still gets short of breath when he moves around.  He has an appointment with the [facility]:  GEN- denies fatigue, fever, weight loss,weakness, recent illness HEENT- denies eye drainage, change in vision, nasal discharge, CVS- denies chest pain, palpitations RESP- denies SOB, cough, wheeze ABD- denies N/V, change in stools, abd pain GU- denies dysuria, hematuria, dribbling, incontinence MSK- denies joint pain, muscle aches, injury Neuro- denies headache, dizziness, syncope, seizure activity       Objective:    BP 132/72   Pulse 64   Temp 98.8 F (37.1 C) (Oral)   Resp 14   Ht 5\' 8"  (1.727 m)  Wt 269 lb (122 kg)   SpO2 97%   BMI 40.90 kg/m  GEN- NAD, alert and oriented x3 HEENT- PERRL, EOMI, non injected sclera, pink conjunctiva, MMM, oropharynx clear Neck- Supple, no JVD CVS- RRR, no murmur RESP-CTAB  ABD-NABS,soft,NT,ND EXT- Left chronic edema > trace on right LE Pulses- Radial 2+        Assessment & Plan:      Problem List Items Addressed This Visit      Unprioritized   Chronic kidney disease   Relevant Orders   Basic metabolic panel   DM (diabetes mellitus) type II controlled with renal manifestation ([place])     Diabetes well controlled with last A1c 7%.  Continue to reiterate the diet.  His Actos was being filled by the [place].  Advised him to stop taking this.      Chronic diastolic heart failure ([place]) - Primary    Currently compensated.  Continue the diuretics as well as potassium.       Other Visit Diagnoses    Pneumonia of both lungs due to infectious organism, unspecified part of lung       He does need follow-up with pulmonary for pulmonary function test also concern for some type of autoimmune connective tissue that may contribute to his shortness of breath.  Discussed with him that he needs referral to pulmonary and rheumatology.  He is can see if this is available at the [place] secondary to cost.  If not then we will do this in the civilian side.  I have given him and his wife copies of his discharge summary his imaging his echo from the hospital.   Relevant Orders   CBC with Differential/Platelet   DOE (dyspnea on exertion)          Note: This dictation was prepared with Dragon dictation along with smaller phrase technology. Any transcriptional errors that result from this process are unintentional.

## 2018-03-13 NOTE — Assessment & Plan Note (Signed)
Diabetes well controlled with last A1c 7%.  Continue to reiterate the diet.  His Actos was being filled by the [place].  Advised him to stop taking this.

## 2018-03-14 LAB — CBC WITH DIFFERENTIAL/PLATELET
Basophils Absolute: 50 cells/uL (ref 0–200)
Basophils Relative: 0.6 %
Eosinophils Absolute: 174 cells/uL (ref 15–500)
Eosinophils Relative: 2.1 %
HCT: 29.8 % — ABNORMAL LOW (ref 38.5–50.0)
Hemoglobin: 10.3 g/dL — ABNORMAL LOW (ref 13.2–17.1)
Lymphs Abs: 3121 cells/uL (ref 850–3900)
MCH: 33.3 pg — ABNORMAL HIGH (ref 27.0–33.0)
MCHC: 34.6 g/dL (ref 32.0–36.0)
MCV: 96.4 fL (ref 80.0–100.0)
MPV: 12.5 fL (ref 7.5–12.5)
Monocytes Relative: 14.4 %
Neutro Abs: 3760 cells/uL (ref 1500–7800)
Neutrophils Relative %: 45.3 %
Platelets: 108 10*3/uL — ABNORMAL LOW (ref 140–400)
RBC: 3.09 10*6/uL — ABNORMAL LOW (ref 4.20–5.80)
RDW: 14.8 % (ref 11.0–15.0)
Total Lymphocyte: 37.6 %
WBC mixed population: 1195 cells/uL — ABNORMAL HIGH (ref 200–950)
WBC: 8.3 10*3/uL (ref 3.8–10.8)

## 2018-03-14 LAB — BASIC METABOLIC PANEL
BUN/Creatinine Ratio: 13 (calc) (ref 6–22)
BUN: 18 mg/dL (ref 7–25)
CO2: 26 mmol/L (ref 20–32)
Calcium: 9 mg/dL (ref 8.6–10.3)
Chloride: 107 mmol/L (ref 98–110)
Creat: 1.42 mg/dL — ABNORMAL HIGH (ref 0.70–1.18)
Glucose, Bld: 88 mg/dL (ref 65–99)
Potassium: 5.1 mmol/L (ref 3.5–5.3)
Sodium: 139 mmol/L (ref 135–146)

## 2018-05-14 ENCOUNTER — Other Ambulatory Visit: Payer: Self-pay | Admitting: Family Medicine

## 2018-05-17 ENCOUNTER — Other Ambulatory Visit: Payer: Self-pay

## 2018-05-17 ENCOUNTER — Ambulatory Visit (INDEPENDENT_AMBULATORY_CARE_PROVIDER_SITE_OTHER): Payer: Medicare HMO | Admitting: Family Medicine

## 2018-05-17 ENCOUNTER — Encounter: Payer: Self-pay | Admitting: Family Medicine

## 2018-05-17 VITALS — BP 128/60 | HR 62 | Temp 98.6°F | Resp 16 | Ht 68.0 in | Wt 281.0 lb

## 2018-05-17 DIAGNOSIS — N183 Chronic kidney disease, stage 3 unspecified: Secondary | ICD-10-CM

## 2018-05-17 DIAGNOSIS — Z794 Long term (current) use of insulin: Secondary | ICD-10-CM

## 2018-05-17 DIAGNOSIS — E1122 Type 2 diabetes mellitus with diabetic chronic kidney disease: Secondary | ICD-10-CM | POA: Diagnosis not present

## 2018-05-17 DIAGNOSIS — I5032 Chronic diastolic (congestive) heart failure: Secondary | ICD-10-CM

## 2018-05-17 DIAGNOSIS — I2510 Atherosclerotic heart disease of native coronary artery without angina pectoris: Secondary | ICD-10-CM | POA: Diagnosis not present

## 2018-05-17 DIAGNOSIS — G4733 Obstructive sleep apnea (adult) (pediatric): Secondary | ICD-10-CM | POA: Diagnosis not present

## 2018-05-17 NOTE — Progress Notes (Signed)
   Subjective:    Patient ID: [name], male    DOB: April 10, 1941, 77 y.o.   MRN: [id number]  Patient presents for Follow-up (is not fasting)  Pt here to f/u chronic medical problems   Last seen in May, weight was 269lbs  CHF- No SOB or chest pain  DM- last A1C  7.4 %, at the [place] typically takes insulin before dinner- giving 38 units in the evening  Currently 40in th emorning and 50 in evening, but due to low glucose, went down 38, and still dropping  fASTING GLUCOSE 166 THIS MORNING  REVIEWED labs from [place] at goal LDL 62  Hyperkalema- potassiu 5.5 Cr 1.43   [facility], has [facility] mask   Review Of Systems:  GEN- denies fatigue, fever, weight loss,weakness, recent illness HEENT- denies eye drainage, change in vision, nasal discharge, CVS- denies chest pain, palpitations RESP- denies SOB, cough, wheeze ABD- denies N/V, change in stools, abd pain GU- denies dysuria, hematuria, dribbling, incontinence MSK- denies joint pain, muscle aches, injury Neuro- denies headache, dizziness, syncope, seizure activity       Objective:    BP 128/60   Pulse 62   Temp 98.6 F (37 C) (Oral)   Resp 16   Ht 5\' 8"  (1.727 m)   Wt 281 lb (127.5 kg)   SpO2 98%   BMI 42.73 kg/m  GEN- NAD, alert and oriented x3 HEENT- PERRL, EOMI, non injected sclera, pink conjunctiva, MMM, oropharynx clear Neck- Supple, no jvd CVS- RRR, no murmur RESP-CTAB ABD-NABS,soft,NT,ND EXT- chronic  Edema L >R Pulses- Radial, DP- 2+        Assessment & Plan:   NOTE RENAL CYST CONFIRMED ON CT ABD, no further imaging needed Faxed [facility] copy to [facility]   Problem List Items Addressed This Visit      Unprioritized   CAD (coronary artery disease)    No chest pain, followed by cardiology Weight is up, though no excessive fluid on exam Discussed salt/ diet again, he is asymptomatic      Chronic diastolic heart failure (HCC)   DM (diabetes mellitus) type II controlled with renal  manifestation ([place]) - Primary    Fair controll but with a lot of  hypoglycemia for his age So decrease to 30 units at bedtime, will checkCBG during day to see if morning dose needs to decrease as well      Relevant Orders   Basic metabolic panel   OSA (obstructive sleep apnea)    Per [facility] work up         Note: This dictation was prepared with Sales executive along with smaller Company secretary. Any transcriptional errors that result from this process are unintentional.

## 2018-05-17 NOTE — Patient Instructions (Addendum)
Decrease evening insulin to 30 units  We will send copy of CT scan to Dr. Ivin Booty  We will call with potassium/ kidney levels  F/U 4 months

## 2018-05-19 ENCOUNTER — Encounter: Payer: Self-pay | Admitting: Family Medicine

## 2018-05-19 NOTE — Assessment & Plan Note (Signed)
Per [facility] work [facility]

## 2018-05-19 NOTE — Assessment & Plan Note (Signed)
Fair controll but with a lot of  hypoglycemia for his age So decrease to 30 units at bedtime, will checkCBG during day to see if morning dose needs to decrease as well

## 2018-05-19 NOTE — Assessment & Plan Note (Signed)
No chest pain, followed by cardiology Weight is up, though no excessive fluid on exam Discussed salt/ diet again, he is asymptomatic

## 2018-05-22 ENCOUNTER — Other Ambulatory Visit: Payer: Medicare HMO

## 2018-05-22 DIAGNOSIS — Z794 Long term (current) use of insulin: Secondary | ICD-10-CM

## 2018-05-22 DIAGNOSIS — E1122 Type 2 diabetes mellitus with diabetic chronic kidney disease: Secondary | ICD-10-CM

## 2018-05-22 DIAGNOSIS — N183 Chronic kidney disease, stage 3 unspecified: Secondary | ICD-10-CM

## 2018-05-22 LAB — BASIC METABOLIC PANEL
BUN/Creatinine Ratio: 17 (calc) (ref 6–22)
BUN: 22 mg/dL (ref 7–25)
CO2: 22 mmol/L (ref 20–32)
Calcium: 9 mg/dL (ref 8.6–10.3)
Chloride: 108 mmol/L (ref 98–110)
Creat: 1.29 mg/dL — ABNORMAL HIGH (ref 0.70–1.18)
Glucose, Bld: 86 mg/dL (ref 65–99)
Potassium: 4.1 mmol/L (ref 3.5–5.3)
Sodium: 139 mmol/L (ref 135–146)

## 2018-06-06 ENCOUNTER — Emergency Department (HOSPITAL_COMMUNITY): Payer: Medicare HMO

## 2018-06-06 ENCOUNTER — Encounter (HOSPITAL_COMMUNITY): Payer: Self-pay | Admitting: Emergency Medicine

## 2018-06-06 ENCOUNTER — Other Ambulatory Visit: Payer: Self-pay

## 2018-06-06 ENCOUNTER — Observation Stay (HOSPITAL_COMMUNITY)
Admission: EM | Admit: 2018-06-06 | Discharge: 2018-06-08 | Disposition: A | Discharge status: Home / Self Care | Payer: Medicare HMO | Attending: Family Medicine | Admitting: Family Medicine

## 2018-06-06 DIAGNOSIS — Z8673 Personal history of transient ischemic attack (TIA), and cerebral infarction without residual deficits: Secondary | ICD-10-CM

## 2018-06-06 DIAGNOSIS — I252 Old myocardial infarction: Secondary | ICD-10-CM | POA: Diagnosis not present

## 2018-06-06 DIAGNOSIS — I739 Peripheral vascular disease, unspecified: Secondary | ICD-10-CM | POA: Diagnosis present

## 2018-06-06 DIAGNOSIS — I2510 Atherosclerotic heart disease of native coronary artery without angina pectoris: Secondary | ICD-10-CM | POA: Diagnosis not present

## 2018-06-06 DIAGNOSIS — E1151 Type 2 diabetes mellitus with diabetic peripheral angiopathy without gangrene: Secondary | ICD-10-CM | POA: Diagnosis not present

## 2018-06-06 DIAGNOSIS — I130 Hypertensive heart and chronic kidney disease with heart failure and stage 1 through stage 4 chronic kidney disease, or unspecified chronic kidney disease: Secondary | ICD-10-CM | POA: Insufficient documentation

## 2018-06-06 DIAGNOSIS — R001 Bradycardia, unspecified: Secondary | ICD-10-CM | POA: Insufficient documentation

## 2018-06-06 DIAGNOSIS — M5136 Other intervertebral disc degeneration, lumbar region: Secondary | ICD-10-CM | POA: Insufficient documentation

## 2018-06-06 DIAGNOSIS — E1122 Type 2 diabetes mellitus with diabetic chronic kidney disease: Secondary | ICD-10-CM | POA: Diagnosis not present

## 2018-06-06 DIAGNOSIS — Z794 Long term (current) use of insulin: Secondary | ICD-10-CM | POA: Diagnosis not present

## 2018-06-06 DIAGNOSIS — Z6841 Body Mass Index (BMI) 40.0 and over, adult: Secondary | ICD-10-CM | POA: Diagnosis not present

## 2018-06-06 DIAGNOSIS — R079 Chest pain, unspecified: Secondary | ICD-10-CM | POA: Diagnosis not present

## 2018-06-06 DIAGNOSIS — Z7982 Long term (current) use of aspirin: Secondary | ICD-10-CM | POA: Insufficient documentation

## 2018-06-06 DIAGNOSIS — N400 Enlarged prostate without lower urinary tract symptoms: Secondary | ICD-10-CM | POA: Diagnosis not present

## 2018-06-06 DIAGNOSIS — N183 Chronic kidney disease, stage 3 (moderate): Secondary | ICD-10-CM | POA: Insufficient documentation

## 2018-06-06 DIAGNOSIS — N179 Acute kidney failure, unspecified: Secondary | ICD-10-CM | POA: Diagnosis not present

## 2018-06-06 DIAGNOSIS — K219 Gastro-esophageal reflux disease without esophagitis: Secondary | ICD-10-CM | POA: Diagnosis not present

## 2018-06-06 DIAGNOSIS — Z951 Presence of aortocoronary bypass graft: Secondary | ICD-10-CM | POA: Insufficient documentation

## 2018-06-06 DIAGNOSIS — Z87891 Personal history of nicotine dependence: Secondary | ICD-10-CM | POA: Insufficient documentation

## 2018-06-06 DIAGNOSIS — E782 Mixed hyperlipidemia: Secondary | ICD-10-CM | POA: Diagnosis present

## 2018-06-06 DIAGNOSIS — I440 Atrioventricular block, first degree: Secondary | ICD-10-CM | POA: Diagnosis not present

## 2018-06-06 DIAGNOSIS — H409 Unspecified glaucoma: Secondary | ICD-10-CM | POA: Insufficient documentation

## 2018-06-06 DIAGNOSIS — I249 Acute ischemic heart disease, unspecified: Secondary | ICD-10-CM | POA: Diagnosis present

## 2018-06-06 DIAGNOSIS — G4733 Obstructive sleep apnea (adult) (pediatric): Secondary | ICD-10-CM | POA: Diagnosis present

## 2018-06-06 DIAGNOSIS — I10 Essential (primary) hypertension: Secondary | ICD-10-CM | POA: Diagnosis present

## 2018-06-06 DIAGNOSIS — Z91041 Radiographic dye allergy status: Secondary | ICD-10-CM | POA: Diagnosis not present

## 2018-06-06 DIAGNOSIS — E785 Hyperlipidemia, unspecified: Secondary | ICD-10-CM | POA: Diagnosis not present

## 2018-06-06 DIAGNOSIS — I5032 Chronic diastolic (congestive) heart failure: Secondary | ICD-10-CM | POA: Diagnosis present

## 2018-06-06 DIAGNOSIS — Z8619 Personal history of other infectious and parasitic diseases: Secondary | ICD-10-CM | POA: Diagnosis not present

## 2018-06-06 DIAGNOSIS — R0789 Other chest pain: Principal | ICD-10-CM | POA: Insufficient documentation

## 2018-06-06 DIAGNOSIS — Z8249 Family history of ischemic heart disease and other diseases of the circulatory system: Secondary | ICD-10-CM | POA: Insufficient documentation

## 2018-06-06 DIAGNOSIS — Z9049 Acquired absence of other specified parts of digestive tract: Secondary | ICD-10-CM | POA: Insufficient documentation

## 2018-06-06 DIAGNOSIS — Z79899 Other long term (current) drug therapy: Secondary | ICD-10-CM | POA: Insufficient documentation

## 2018-06-06 DIAGNOSIS — E1129 Type 2 diabetes mellitus with other diabetic kidney complication: Secondary | ICD-10-CM | POA: Diagnosis present

## 2018-06-06 LAB — BASIC METABOLIC PANEL
Anion gap: 7 (ref 5–15)
BUN: 24 mg/dL — ABNORMAL HIGH (ref 8–23)
CO2: 27 mmol/L (ref 22–32)
Calcium: 9.3 mg/dL (ref 8.9–10.3)
Chloride: 105 mmol/L (ref 98–111)
Creatinine, Ser: 1.66 mg/dL — ABNORMAL HIGH (ref 0.61–1.24)
GFR calc Af Amer: 45 mL/min — ABNORMAL LOW (ref >60)
GFR calc non Af Amer: 39 mL/min — ABNORMAL LOW (ref >60)
Glucose, Bld: 94 mg/dL (ref 70–99)
Potassium: 4.2 mmol/L (ref 3.5–5.1)
Sodium: 139 mmol/L (ref 135–145)

## 2018-06-06 LAB — MAGNESIUM: Magnesium: 2 mg/dL (ref 1.7–2.4)

## 2018-06-06 LAB — TROPONIN I
Troponin I: <0.03 ng/mL (ref <0.03)
Troponin I: <0.03 ng/mL (ref <0.03)

## 2018-06-06 LAB — CBC
HCT: 34.8 % — ABNORMAL LOW (ref 39.0–52.0)
Hemoglobin: 10.8 g/dL — ABNORMAL LOW (ref 13.0–17.0)
MCH: 30.7 pg (ref 26.0–34.0)
MCHC: 31 g/dL (ref 30.0–36.0)
MCV: 98.9 fL (ref 78.0–100.0)
Platelets: 231 10*3/uL (ref 150–400)
RBC: 3.52 MIL/uL — ABNORMAL LOW (ref 4.22–5.81)
RDW: 15.5 % (ref 11.5–15.5)
WBC: 6.9 10*3/uL (ref 4.0–10.5)

## 2018-06-06 LAB — BRAIN NATRIURETIC PEPTIDE: B Natriuretic Peptide: 40 pg/mL (ref 0.0–100.0)

## 2018-06-06 MED ORDER — PREDNISOLONE ACETATE 1 % OP SUSP: 1.0000 [drp] | OPHTHALMIC | Status: DC

## 2018-06-06 MED ORDER — INSULIN ASPART PROT & ASPART (70-30 MIX) 100 UNIT/ML ~~LOC~~ SUSP
40.0000 [IU] | SUBCUTANEOUS | Status: DC
Start: 2018-06-06 — End: 2018-06-06

## 2018-06-06 MED ORDER — POTASSIUM CHLORIDE CRYS ER 20 MEQ PO TBCR
20.0000 meq | EXTENDED_RELEASE_TABLET | Freq: Every day | ORAL | Status: DC
  Administered 2018-06-07 – 2018-06-08 (×2): 20 meq via ORAL
  Filled 2018-06-06 (×2): qty 1

## 2018-06-06 MED ORDER — KETOROLAC TROMETHAMINE 0.5 % OP SOLN: 2.0000 [drp] | OPHTHALMIC | Status: DC

## 2018-06-06 MED ORDER — ONDANSETRON HCL 4 MG PO TABS: 4.0000 mg | ORAL_TABLET | Freq: Four times a day (QID) | ORAL | Status: DC | PRN

## 2018-06-06 MED ORDER — ENOXAPARIN SODIUM 40 MG/0.4ML ~~LOC~~ SOLN
40.0000 mg | SUBCUTANEOUS | Status: DC
  Administered 2018-06-07 – 2018-06-08 (×2): 40 mg via SUBCUTANEOUS
  Filled 2018-06-06 (×2): qty 0.4

## 2018-06-06 MED ORDER — IPRATROPIUM-ALBUTEROL 0.5-2.5 (3) MG/3ML IN SOLN: 3.0000 mL | Freq: Four times a day (QID) | RESPIRATORY_TRACT | Status: DC | PRN

## 2018-06-06 MED ORDER — NITROGLYCERIN 2 % TD OINT
1.0000 [in_us] | TOPICAL_OINTMENT | Freq: Four times a day (QID) | TRANSDERMAL | Status: AC
  Administered 2018-06-06 – 2018-06-07 (×2): 1 [in_us] via TOPICAL
  Filled 2018-06-06 (×2): qty 1

## 2018-06-06 MED ORDER — METFORMIN HCL 500 MG PO TABS: 1000.0000 mg | ORAL_TABLET | Freq: Two times a day (BID) | ORAL | Status: DC

## 2018-06-06 MED ORDER — FOLIC ACID 1 MG PO TABS
1.0000 mg | ORAL_TABLET | Freq: Every day | ORAL | Status: DC
  Administered 2018-06-07 – 2018-06-08 (×2): 1 mg via ORAL
  Filled 2018-06-06 (×2): qty 1

## 2018-06-06 MED ORDER — ONDANSETRON HCL 4 MG/2ML IJ SOLN: 4.0000 mg | Freq: Four times a day (QID) | INTRAMUSCULAR | Status: DC | PRN

## 2018-06-06 MED ORDER — TAMSULOSIN HCL 0.4 MG PO CAPS
0.8000 mg | ORAL_CAPSULE | Freq: Every evening | ORAL | Status: DC
  Administered 2018-06-06 – 2018-06-07 (×2): 0.8 mg via ORAL
  Filled 2018-06-06 (×2): qty 2

## 2018-06-06 MED ORDER — INSULIN ASPART PROT & ASPART (70-30 MIX) 100 UNIT/ML ~~LOC~~ SUSP: 50.0000 [IU] | Freq: Every day | SUBCUTANEOUS | Status: DC

## 2018-06-06 MED ORDER — ACETAMINOPHEN 325 MG PO TABS: 650.0000 mg | ORAL_TABLET | Freq: Four times a day (QID) | ORAL | Status: DC | PRN

## 2018-06-06 MED ORDER — ACETAMINOPHEN 325 MG PO TABS
650.0000 mg | ORAL_TABLET | ORAL | Status: DC | PRN
  Administered 2018-06-07: 650 mg via ORAL
  Filled 2018-06-06: qty 2

## 2018-06-06 MED ORDER — GI COCKTAIL ~~LOC~~
30.0000 mL | Freq: Once | ORAL | Status: AC
Start: 2018-06-06 — End: 2018-06-06
  Administered 2018-06-06: 30 mL via ORAL
  Filled 2018-06-06: qty 30

## 2018-06-06 MED ORDER — ASPIRIN EC 81 MG PO TBEC: 81.0000 mg | DELAYED_RELEASE_TABLET | Freq: Every day | ORAL | Status: DC

## 2018-06-06 MED ORDER — FUROSEMIDE 20 MG PO TABS: 20.0000 mg | ORAL_TABLET | Freq: Every day | ORAL | Status: DC

## 2018-06-06 MED ORDER — LOSARTAN POTASSIUM 50 MG PO TABS: 100.0000 mg | ORAL_TABLET | Freq: Every day | ORAL | Status: DC

## 2018-06-06 MED ORDER — ASPIRIN-DIPYRIDAMOLE ER 25-200 MG PO CP12
1.0000 | ORAL_CAPSULE | Freq: Two times a day (BID) | ORAL | Status: DC
  Administered 2018-06-06 – 2018-06-08 (×4): 1 via ORAL
  Filled 2018-06-06 (×4): qty 1

## 2018-06-06 MED ORDER — POLYVINYL ALCOHOL 1.4 % OP SOLN: 1.0000 [drp] | Freq: Four times a day (QID) | OPHTHALMIC | Status: DC | PRN

## 2018-06-06 MED ORDER — ENOXAPARIN SODIUM 40 MG/0.4ML ~~LOC~~ SOLN: 40.0000 mg | SUBCUTANEOUS | Status: DC

## 2018-06-06 MED ORDER — INSULIN ASPART PROT & ASPART (70-30 MIX) 100 UNIT/ML ~~LOC~~ SUSP
40.0000 [IU] | Freq: Every day | SUBCUTANEOUS | Status: DC
  Administered 2018-06-07: 40 [IU] via SUBCUTANEOUS
  Filled 2018-06-06: qty 10

## 2018-06-06 MED ORDER — KETOROLAC TROMETHAMINE 0.5 % OP SOLN
1.0000 [drp] | Freq: Two times a day (BID) | OPHTHALMIC | Status: DC
  Administered 2018-06-07 – 2018-06-08 (×3): 1 [drp] via OPHTHALMIC
  Filled 2018-06-06: qty 3

## 2018-06-06 MED ORDER — FAMOTIDINE IN NACL 20-0.9 MG/50ML-% IV SOLN
20.0000 mg | Freq: Once | INTRAVENOUS | Status: AC
  Administered 2018-06-06: 20 mg via INTRAVENOUS
  Filled 2018-06-06: qty 50

## 2018-06-06 MED ORDER — NITROGLYCERIN 0.4 MG SL SUBL: 0.4000 mg | SUBLINGUAL_TABLET | SUBLINGUAL | Status: DC | PRN

## 2018-06-06 MED ORDER — PREDNISOLONE ACETATE 1 % OP SUSP
1.0000 [drp] | Freq: Three times a day (TID) | OPHTHALMIC | Status: DC
  Administered 2018-06-07 – 2018-06-08 (×4): 1 [drp] via OPHTHALMIC
  Filled 2018-06-06: qty 1

## 2018-06-06 MED ORDER — SPIRONOLACTONE 25 MG PO TABS
25.0000 mg | ORAL_TABLET | Freq: Every day | ORAL | Status: DC
  Administered 2018-06-07: 25 mg via ORAL
  Filled 2018-06-06: qty 1

## 2018-06-06 MED ORDER — KETOROLAC TROMETHAMINE 0.5 % OP SOLN
1.0000 [drp] | Freq: Four times a day (QID) | OPHTHALMIC | Status: DC
  Administered 2018-06-07 – 2018-06-08 (×5): 1 [drp] via OPHTHALMIC

## 2018-06-06 MED ORDER — ASPIRIN 81 MG PO CHEW
324.0000 mg | CHEWABLE_TABLET | Freq: Once | ORAL | Status: AC
  Administered 2018-06-06: 324 mg via ORAL
  Filled 2018-06-06: qty 4

## 2018-06-06 MED ORDER — METOPROLOL TARTRATE 25 MG PO TABS
25.0000 mg | ORAL_TABLET | Freq: Two times a day (BID) | ORAL | Status: DC
  Administered 2018-06-06 – 2018-06-08 (×4): 25 mg via ORAL
  Filled 2018-06-06 (×4): qty 1

## 2018-06-06 MED ORDER — ATORVASTATIN CALCIUM 40 MG PO TABS
40.0000 mg | ORAL_TABLET | Freq: Every evening | ORAL | Status: DC
  Administered 2018-06-06 – 2018-06-07 (×2): 40 mg via ORAL
  Filled 2018-06-06 (×2): qty 1

## 2018-06-06 MED ORDER — MORPHINE SULFATE (PF) 4 MG/ML IV SOLN: 4.0000 mg | INTRAVENOUS | Status: DC | PRN

## 2018-06-06 MED ORDER — PREDNISOLONE ACETATE 1 % OP SUSP
1.0000 [drp] | Freq: Two times a day (BID) | OPHTHALMIC | Status: DC
  Administered 2018-06-07 – 2018-06-08 (×3): 1 [drp] via OPHTHALMIC
  Filled 2018-06-06: qty 5

## 2018-06-06 MED ORDER — ACETAMINOPHEN ER 650 MG PO TBCR: 1300.0000 mg | EXTENDED_RELEASE_TABLET | Freq: Three times a day (TID) | ORAL | Status: DC | PRN

## 2018-06-06 NOTE — ED Triage Notes (Signed)
Pt c/o left sided chest pain all day with nausea.

## 2018-06-06 NOTE — H&P (Signed)
History and Physical    [name] WSF:[id number] DOB: [birth year] DOA: 06/06/2018  PCP: Alycia Rossetti, MD   Patient coming from: Home.  I have personally briefly reviewed patient's old medical records in [place]  Chief Complaint: Chest pain.  HPI: [name] is a 77 y.o. male with medical history significant of C. Diff colitis, chronic kidney disease, CAD/CABG, type 2 diabetes, GERD, glaucoma, hypertension, peripheral vascular disease, sleep apnea on CPAP, history of stroke who is coming to the emergency department with complaints of on and off chest pain since earlier today.  He denies dyspnea, palpitations, diaphoresis, dizziness, nausea, emesis, PND orthopnea.  He occasionally gets pitting edema of the lower extremities, particularly on the extremities that the graft was taken for his CABG.  He denies fever, chills, sore throat, wheezing, hemoptysis, diarrhea, constipation, melena or hematochezia.  Denies dysuria, frequency or hematuria.  No polyuria, polydipsia, polyphagia or blurred vision.  No heat or cold intolerance.  Denies pruritus or rashes.  The patient and his wife is concerned because he states that the patient had a benign stress test before his angiogram showed multiple coronary artery stenosis and he had to go for CABG.  ED Course: His temperature was 98.3 F, pulse 68, respirations 14, blood pressure 153/70 mmHg and O2 sat 100% on room air.  He received a GI cocktail in the emergency department.  Given a GI cocktail in the ED.  I started the patient on Nitropaste.  His white count 6.9, hemoglobin 10.8 and platelets 231.  Sodium 139, potassium 4.2, chloride 105 CO2 27 mmol/L.  BUN was 24, creatinine 1.66, calcium 9.3, magnesium 2.0 and glucose 94 mg/dL.  Troponin level was normal.  BNP was 40.0 pg/mL.  EKG was sinus rhythm with first-degree AV block, left axis deviation old anterior infarct.  His chest radiograph did not show any acute cardiopulmonary  pathology.  Review of Systems: As per HPI otherwise 10 point review of systems negative.    Past Medical History:  Diagnosis Date  . C. difficile diarrhea   . 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 Coronary artery disease    Status post CABG in 2007 [facility] [place])  . Diabetes mellitus with stage 1 chronic kidney disease ([place]) 05/20/2011  . GERD (gastroesophageal reflux disease)   . Glaucoma   . Hypertension   . Peripheral vascular disease ([place])   . Sleep apnea   . Stroke [facility]) 2008, 2014, 2015    Past Surgical History:  Procedure Laterality Date  . CHOLECYSTECTOMY    . CORONARY ARTERY BYPASS GRAFT    . NO PAST SURGERIES    . THYROID SURGERY       reports that he has quit smoking. His smoking use included cigarettes. He has never used smokeless tobacco. He reports that he does not drink alcohol or use drugs.  Allergies  Allergen Reactions  . Ivp Dye [Iodinated Diagnostic Agents] Rash    Family History  Problem Relation Age of Onset  . Diabetes Mother   . Heart failure Mother   . Hypertension Mother   . Diabetes Father   . Heart failure Father   . Hypertension Father   . Diabetes Sister   . Heart failure Sister   . Hypertension Sister   . Hyperlipidemia Sister   . Diabetes Brother   . Heart failure Brother   . Hypertension Brother     Prior to Admission  medications   Medication Sig Start Date End Date Taking? Authorizing Provider  acetaminophen (TYLENOL) 650 MG CR tablet Take 1,300 mg by mouth every 8 (eight) hours as needed for pain.    [provider]  Adalimumab 80 MG/0.8ML & 40MG /0.4ML PNKT Inject 1 Dose into the skin every 14 (fourteen) days. 09/21/17   [provider]  atorvastatin (LIPITOR) 80 MG tablet Take 40 mg by mouth every evening.     [provider]  Brinzolamide-Brimonidine (SIMBRINZA) 1-0.2 % SUSP Apply 1 drop to eye 2 (two) times daily.    [provider]   carboxymethylcellulose (REFRESH PLUS) 0.5 % SOLN Place 1 drop into both eyes 4 (four) times daily as needed.    [provider]  clotrimazole-betamethasone (LOTRISONE) cream Apply 1 application topically 2 (two) times daily. Patient taking differently: Apply 1 application topically as needed.  12/02/17   [place], Modena Nunnery, MD  dipyridamole-aspirin [facility]) 200-25 MG per 12 hr capsule Take 1 capsule by mouth 2 (two) times daily.    [provider]  folic acid (FOLVITE) 1 MG tablet Take 1 mg by mouth. 03/14/17   [provider]  furosemide (LASIX) 20 MG tablet Take 1 tablet (20 mg total) by mouth daily. 02/19/18   Hayden Rasmussen, MD  insulin aspart protamine- aspart (NOVOLOG MIX 70/30) (70-30) 100 UNIT/ML injection Inject into the skin. Patient takes 40 units in the morning and 50 units at night.    [provider]  Ipratropium-Albuterol (COMBIVENT RESPIMAT) 20-100 MCG/ACT AERS respimat Inhale 1 puff into the lungs every 6 (six) hours.    [provider]  ketorolac (ACULAR) 0.5 % ophthalmic solution Place 1 drop into the right eye 4 (four) times daily.  03/10/17   [provider]  losartan (COZAAR) 100 MG tablet Take 100 mg by mouth daily.      [provider]  metFORMIN (GLUCOPHAGE) 1000 MG tablet Take 1,000 mg by mouth 2 (two) times daily with a meal.      [provider]  methotrexate (RHEUMATREX) 2.5 MG tablet Take 20 mg by mouth once a week. 06/23/17   [provider]  metoprolol (LOPRESSOR) 50 MG tablet Take 0.5 tablets (25 mg total) by mouth 2 (two) times daily. FOR HEART/BLOOD PRESSURE. HOLD FOR SYSTOLIC BLOOD PRESSURE LESS THAN 1OO/HEART RATE LESS THAN 60 05/20/11   Annita Brod, MD  potassium chloride (K-DUR,KLOR-CON) 10 MEQ tablet TAKE 2 TABLETS BY MOUTH ONCE DAILY -  TAKE  WITH  LASIX  (FUROSEMIDE) 05/15/18   [place], Modena Nunnery, MD  prednisoLONE acetate (PRED FORTE) 1 % ophthalmic suspension INSTILL 1 DROP  INTO LEFT EYE THREE TIMES DAILY AND TWICE DAILY IN THE RIGHT EYE 05/14/18   [provider]  spironolactone (ALDACTONE) 25 MG tablet Take 25 mg by mouth daily.    [provider]  tamsulosin (FLOMAX) 0.4 MG CAPS capsule Take 2 capsules (0.8 mg total) by mouth every evening. 06/09/16   Alycia Rossetti, MD    Physical Exam: Vitals:   06/06/18 1859 06/06/18 1930 06/06/18 2000 06/06/18 2030  BP:  (!) 153/75 130/61 137/64  Pulse:  66 63 66  Resp:  17 14 16   Temp:      SpO2:  100% 100% 99%  Weight: 122.9 kg (271 lb)     Height: 5\' 8"  (1.727 m)       Constitutional: NAD, calm, comfortable Eyes: PERRL, lids and conjunctivae normal ENMT: Mucous membranes are moist. Posterior pharynx clear  of any exudate or lesions. Neck: normal, supple, no masses, no thyromegaly Respiratory: clear to auscultation bilaterally, no wheezing, no crackles. Normal respiratory effort. No accessory muscle use.  Cardiovascular: Regular rate and rhythm, no murmurs / rubs / gallops. No extremity edema. 2+ pedal pulses. No carotid bruits.  Abdomen: Obese, distended, soft, mild epigastric tenderness, no masses palpated. No hepatosplenomegaly. Bowel sounds positive.  Musculoskeletal: no clubbing / cyanosis. Good ROM, no contractures. Normal muscle tone.  Skin: no rashes, lesions, ulcers on very limited dermatological examination per Neurologic: CN 2-12 grossly intact. Sensation intact, DTR normal. Strength 5/5 in all 4.  Psychiatric: Normal judgment and insight. Alert and oriented x 3. Normal mood.    Labs on Admission: I have personally reviewed following labs and imaging studies  CBC: Recent Labs  Lab 06/06/18 1919  WBC 6.9  HGB 10.8*  HCT 34.8*  MCV 98.9  PLT 161   Basic Metabolic Panel: Recent Labs  Lab 06/06/18 1919  NA 139  K 4.2  CL 105  CO2 27  GLUCOSE 94  BUN 24*  CREATININE 1.66*  CALCIUM 9.3   GFR: Estimated Creatinine Clearance: 49.1 mL/min (A) (by C-G formula based on  SCr of 1.66 mg/dL (H)). Liver Function Tests: No results for input(s): AST, ALT, ALKPHOS, BILITOT, PROT, ALBUMIN in the last 168 hours. No results for input(s): LIPASE, AMYLASE in the last 168 hours. No results for input(s): AMMONIA in the last 168 hours. Coagulation Profile: No results for input(s): INR, PROTIME in the last 168 hours. Cardiac Enzymes: Recent Labs  Lab 06/06/18 1919  TROPONINI <0.03   BNP (last 3 results) No results for input(s): PROBNP in the last 8760 hours. HbA1C: No results for input(s): HGBA1C in the last 72 hours. CBG: No results for input(s): GLUCAP in the last 168 hours. Lipid Profile: No results for input(s): CHOL, HDL, LDLCALC, TRIG, CHOLHDL, LDLDIRECT in the last 72 hours. Thyroid Function Tests: No results for input(s): TSH, T4TOTAL, FREET4, T3FREE, THYROIDAB in the last 72 hours. Anemia Panel: No results for input(s): VITAMINB12, FOLATE, FERRITIN, TIBC, IRON, RETICCTPCT in the last 72 hours. Urine analysis:    Component Value Date/Time   COLORURINE YELLOW 02/24/2018 1047   APPEARANCEUR HAZY (A) 02/24/2018 1047   LABSPEC 1.014 02/24/2018 1047   PHURINE 6.0 02/24/2018 1047   GLUCOSEU NEGATIVE 02/24/2018 1047   HGBUR SMALL (A) 02/24/2018 1047   BILIRUBINUR NEGATIVE 02/24/2018 [place] 02/24/2018 1047   PROTEINUR NEGATIVE 02/24/2018 1047   UROBILINOGEN 0.2 03/28/2014 0145   NITRITE NEGATIVE 02/24/2018 1047   LEUKOCYTESUR NEGATIVE 02/24/2018 1047    Radiological Exams on Admission: Dg Chest 2 View  Result Date: 06/06/2018 CLINICAL DATA:  Chest pain. EXAM: CHEST - 2 VIEW COMPARISON:  Radiograph of Mar 01, 2018. FINDINGS: Stable cardiomegaly. Sternotomy wires are noted. No pneumothorax or pleural effusion is noted. Both lungs are clear. The visualized skeletal structures are unremarkable. IMPRESSION: No active cardiopulmonary disease. Electronically Signed   By: Marijo Conception, M.D.   On: 06/06/2018 19:56    EKG: Independently  reviewed.  Vent. rate 65 BPM PR interval 210 ms QRS duration 88 ms QT/QTc 356/370 ms P-R-T axes 27 -45 -44 Sinus rhythm with 1st degree A-V block Left axis deviation Possible Anterior infarct , age undetermined Abnormal ECG  Assessment/Plan Principal Problem:   ACS (acute coronary syndrome) (HCC)   CAD (coronary artery disease) Observation/stepdown. Continue supplemental oxygen. Continue aspirin, metoprolol and statin. Nitropaste to anterior chest wall. EKG in a.m.  EKG as needed. Trend troponin levels. Cardiology consult in a.m.  Active Problems:   HTN (hypertension) Continue losartan 100 mg p.o. daily. Continue metoprolol 25 mg p.o. twice daily. Continue spironolactone 25 mg p.o. daily. Continue furosemide 20 mg p.o. daily. Monitor blood pressure, heart rate, renal function electrolytes.    DM (diabetes mellitus) type II controlled with renal manifestation (HCC) Carb modified diet. Continue 70/30 NovoLog Mix 40 units with breakfast and 50 units with supper. CBG monitoring with regular insulin sliding scale.    PVD (peripheral vascular disease) (HCC) Continue Aggrenox and statin.    Hyperlipidemia Continue atorvastatin 40 mg p.o. daily. Monitor LFTs as needed. Fasting lipid profile follow-up as an outpatient.    History of stroke Continue aspirin.    OSA (obstructive sleep apnea) Continue nocturnal CPAP.    Chronic diastolic heart failure (HCC) No signs of decompensation. Continue losartan, metoprolol and spironolactone.    BPH (benign prostatic hyperplasia) Continue daily Flomax.    DVT prophylaxis: Lovenox SQ. Code Status: Full code. Family Communication: His son was present in the ED room. Disposition Plan: Observation in SDu due to dynamic EKG hanges.  Consults called: Routine cardiology consult. Admission status: Observation/Telemetry.   Reubin Milan MD [facility] Pager [phone].  If 7PM-7AM, please contact  night-coverage [email or web address] Password [email or web address]  06/06/2018, 9:09 PM

## 2018-06-07 ENCOUNTER — Observation Stay (HOSPITAL_BASED_OUTPATIENT_CLINIC_OR_DEPARTMENT_OTHER): Payer: Medicare HMO

## 2018-06-07 DIAGNOSIS — N183 Chronic kidney disease, stage 3 (moderate): Secondary | ICD-10-CM

## 2018-06-07 DIAGNOSIS — Z8673 Personal history of transient ischemic attack (TIA), and cerebral infarction without residual deficits: Secondary | ICD-10-CM | POA: Diagnosis not present

## 2018-06-07 DIAGNOSIS — E782 Mixed hyperlipidemia: Secondary | ICD-10-CM

## 2018-06-07 DIAGNOSIS — I739 Peripheral vascular disease, unspecified: Secondary | ICD-10-CM | POA: Diagnosis not present

## 2018-06-07 DIAGNOSIS — I130 Hypertensive heart and chronic kidney disease with heart failure and stage 1 through stage 4 chronic kidney disease, or unspecified chronic kidney disease: Secondary | ICD-10-CM | POA: Diagnosis not present

## 2018-06-07 DIAGNOSIS — R079 Chest pain, unspecified: Secondary | ICD-10-CM

## 2018-06-07 DIAGNOSIS — I249 Acute ischemic heart disease, unspecified: Secondary | ICD-10-CM | POA: Diagnosis not present

## 2018-06-07 DIAGNOSIS — R0789 Other chest pain: Secondary | ICD-10-CM | POA: Diagnosis not present

## 2018-06-07 DIAGNOSIS — E1122 Type 2 diabetes mellitus with diabetic chronic kidney disease: Secondary | ICD-10-CM

## 2018-06-07 DIAGNOSIS — Z794 Long term (current) use of insulin: Secondary | ICD-10-CM

## 2018-06-07 DIAGNOSIS — G4733 Obstructive sleep apnea (adult) (pediatric): Secondary | ICD-10-CM | POA: Diagnosis not present

## 2018-06-07 DIAGNOSIS — I5032 Chronic diastolic (congestive) heart failure: Secondary | ICD-10-CM | POA: Diagnosis not present

## 2018-06-07 DIAGNOSIS — E1151 Type 2 diabetes mellitus with diabetic peripheral angiopathy without gangrene: Secondary | ICD-10-CM | POA: Diagnosis not present

## 2018-06-07 DIAGNOSIS — I2510 Atherosclerotic heart disease of native coronary artery without angina pectoris: Secondary | ICD-10-CM | POA: Diagnosis not present

## 2018-06-07 DIAGNOSIS — N179 Acute kidney failure, unspecified: Secondary | ICD-10-CM | POA: Diagnosis not present

## 2018-06-07 DIAGNOSIS — E785 Hyperlipidemia, unspecified: Secondary | ICD-10-CM | POA: Diagnosis not present

## 2018-06-07 HISTORY — DX: Chest pain, unspecified: R07.9

## 2018-06-07 LAB — ECHOCARDIOGRAM LIMITED
Height: 68 in
Weight: 4359.82 oz

## 2018-06-07 LAB — GLUCOSE, CAPILLARY
Glucose-Capillary: 144 mg/dL — ABNORMAL HIGH (ref 70–99)
Glucose-Capillary: 201 mg/dL — ABNORMAL HIGH (ref 70–99)
Glucose-Capillary: 58 mg/dL — ABNORMAL LOW (ref 70–99)
Glucose-Capillary: 48 mg/dL — ABNORMAL LOW (ref 70–99)
Glucose-Capillary: 103 mg/dL — ABNORMAL HIGH (ref 70–99)
Glucose-Capillary: 123 mg/dL — ABNORMAL HIGH (ref 70–99)

## 2018-06-07 LAB — BASIC METABOLIC PANEL
Anion gap: 10 (ref 5–15)
BUN: 23 mg/dL (ref 8–23)
CO2: 26 mmol/L (ref 22–32)
Calcium: 9.3 mg/dL (ref 8.9–10.3)
Chloride: 105 mmol/L (ref 98–111)
Creatinine, Ser: 1.73 mg/dL — ABNORMAL HIGH (ref 0.61–1.24)
GFR calc Af Amer: 43 mL/min — ABNORMAL LOW (ref >60)
GFR calc non Af Amer: 37 mL/min — ABNORMAL LOW (ref >60)
Glucose, Bld: 81 mg/dL (ref 70–99)
Potassium: 4.2 mmol/L (ref 3.5–5.1)
Sodium: 141 mmol/L (ref 135–145)

## 2018-06-07 LAB — MRSA PCR SCREENING: MRSA by PCR: NEGATIVE

## 2018-06-07 LAB — TROPONIN I
Troponin I: <0.03 ng/mL (ref <0.03)
Troponin I: <0.03 ng/mL (ref <0.03)

## 2018-06-07 MED ORDER — INSULIN ASPART PROT & ASPART (70-30 MIX) 100 UNIT/ML ~~LOC~~ SUSP: 20.0000 [IU] | Freq: Every day | SUBCUTANEOUS | Status: DC

## 2018-06-07 MED ORDER — PERFLUTREN LIPID MICROSPHERE
1.0000 mL | INTRAVENOUS | Status: AC | PRN
  Administered 2018-06-07: 8 mL via INTRAVENOUS
  Filled 2018-06-07: qty 10

## 2018-06-07 MED ORDER — SODIUM CHLORIDE 0.9 % IV SOLN
INTRAVENOUS | Status: DC
  Administered 2018-06-07: 11:00:00 via INTRAVENOUS

## 2018-06-07 MED ORDER — INSULIN ASPART PROT & ASPART (70-30 MIX) 100 UNIT/ML ~~LOC~~ SUSP
30.0000 [IU] | Freq: Every day | SUBCUTANEOUS | Status: DC
  Filled 2018-06-07: qty 10

## 2018-06-07 MED ORDER — INSULIN ASPART PROT & ASPART (70-30 MIX) 100 UNIT/ML ~~LOC~~ SUSP: 30.0000 [IU] | Freq: Every day | SUBCUTANEOUS | Status: DC

## 2018-06-07 MED ORDER — INSULIN ASPART 100 UNIT/ML ~~LOC~~ SOLN
0.0000 [IU] | Freq: Three times a day (TID) | SUBCUTANEOUS | Status: DC
  Administered 2018-06-07: 2 [IU] via SUBCUTANEOUS
  Administered 2018-06-07: 5 [IU] via SUBCUTANEOUS

## 2018-06-07 MED ORDER — INSULIN ASPART 100 UNIT/ML ~~LOC~~ SOLN
0.0000 [IU] | Freq: Three times a day (TID) | SUBCUTANEOUS | Status: DC
  Administered 2018-06-08: 2 [IU] via SUBCUTANEOUS
  Administered 2018-06-08: 3 [IU] via SUBCUTANEOUS

## 2018-06-07 NOTE — ED Provider Notes (Signed)
[facility] Provider Note   CSN: [id number] Arrival date & time: 06/06/18  1847     History   Chief Complaint Chief Complaint  Patient presents with  . Chest Pain    HPI [name] is a 77 y.o. male.   Chest Pain   This is a new problem. The current episode started 3 to 5 hours ago. The problem occurs constantly. The problem has not changed since onset.The pain is present in the substernal region. The pain is mild. The quality of the pain is described as pressure-like. The pain does not radiate.    Past Medical History:  Diagnosis Date  . C. difficile diarrhea   . 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 Coronary artery disease    Status post CABG in 2007 [facility])  . 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 GERD (gastroesophageal reflux disease)   . Glaucoma   . Hypertension   . Peripheral vascular disease ([place])   . Sleep apnea   . Stroke [facility]) 2008, 2014, 2015    Patient Active Problem List   Diagnosis Date Noted  . ACS (acute coronary syndrome) ([place]) 06/06/2018  . Hypoxia   . Sepsis ([place]) 02/24/2018  . Lactic acidosis 02/24/2018  . Acute-on-chronic kidney injury ([place]) 02/24/2018  . Dyspnea 02/20/2018  . BPH (benign prostatic hyperplasia) 02/20/2018  . DDD (degenerative disc disease), lumbar 10/17/2017  . Chronic diastolic heart failure ([place]) 10/20/2015  . Peripheral edema 01/22/2015  . Tinea pedis 10/21/2014  . 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 Chronic kidney disease 11/21/2013  . Left leg weakness 09/06/2013  . CVA (cerebral infarction) 09/01/2013  . Left-sided weakness 08/22/2013  . OSA (obstructive sleep apnea) 04/12/2013  . DM (diabetes mellitus) type II controlled with renal manifestation ([place]) 11/28/2012  . PVD (peripheral vascular disease) ([place]) 11/28/2012  . CAD (coronary artery disease) 11/28/2012  .  Hyperlipidemia 11/28/2012  . Obesity, Class III, BMI 40-49.9 (morbid obesity) ([place]) 11/28/2012  . History of stroke 11/28/2012  . OAB (overactive bladder) 11/28/2012  . HTN (hypertension) 05/20/2011    Past Surgical History:  Procedure Laterality Date  . CHOLECYSTECTOMY    . CORONARY ARTERY BYPASS GRAFT    . NO PAST SURGERIES    . THYROID SURGERY          Home Medications    Prior to Admission medications   Medication Sig Start Date End Date Taking? Authorizing Provider  acetaminophen (TYLENOL) 650 MG CR tablet Take 1,300 mg by mouth every 8 (eight) hours as needed for pain.   Yes [provider]  Adalimumab 80 MG/0.8ML & 40MG /0.4ML PNKT Inject 1 Dose into the skin every 14 (fourteen) days. 09/21/17  Yes [provider]  atorvastatin (LIPITOR) 80 MG tablet Take 40 mg by mouth every evening.    Yes [provider]  Brinzolamide-Brimonidine (SIMBRINZA) 1-0.2 % SUSP Place 1 drop into the right eye 2 (two) times daily.    Yes [provider]  carboxymethylcellulose (REFRESH PLUS) 0.5 % SOLN Place 1 drop into both eyes 4 (four) times daily as needed.   Yes [provider]  clotrimazole-betamethasone (LOTRISONE) cream Apply 1 application topically 2 (two) times daily. Patient taking differently: Apply 1 application topically as needed.  12/02/17  Yes [place], Modena Nunnery, MD  dipyridamole-aspirin (AGGRENOX) 200-25 MG per 12 hr capsule Take 1 capsule  by mouth 2 (two) times daily.   Yes [provider]  folic acid (FOLVITE) 1 MG tablet Take 1 mg by mouth daily.  03/14/17  Yes [provider]  furosemide (LASIX) 20 MG tablet Take 1 tablet (20 mg total) by mouth daily. 02/19/18  Yes Hayden Rasmussen, MD  insulin aspart protamine- aspart (NOVOLOG MIX 70/30) (70-30) 100 UNIT/ML injection Inject 40-50 Units into the skin See admin instructions. Patient takes 40 units in the morning and 50 units at night.    Yes [provider]    Ipratropium-Albuterol (COMBIVENT RESPIMAT) 20-100 MCG/ACT AERS respimat Inhale 1 puff into the lungs every 6 (six) hours as needed for wheezing or shortness of breath.    Yes [provider]  ketorolac (ACULAR) 0.5 % ophthalmic solution Place 2-4 drops into the right eye See admin instructions. 1 drop in the Right eye twice daily and one drop in the left eye 4 times daily 03/10/17  Yes [provider]  losartan (COZAAR) 100 MG tablet Take 100 mg by mouth daily.     Yes [provider]  metFORMIN (GLUCOPHAGE) 1000 MG tablet Take 1,000 mg by mouth 2 (two) times daily with a meal.     Yes [provider]  methotrexate (RHEUMATREX) 2.5 MG tablet Take 20 mg by mouth once a week. 06/23/17  Yes [provider]  metoprolol (LOPRESSOR) 50 MG tablet Take 0.5 tablets (25 mg total) by mouth 2 (two) times daily. FOR HEART/BLOOD PRESSURE. HOLD FOR SYSTOLIC BLOOD PRESSURE LESS THAN 1OO/HEART RATE LESS THAN 60 05/20/11  Yes Annita Brod, MD  potassium chloride (K-DUR,KLOR-CON) 10 MEQ tablet TAKE 2 TABLETS BY MOUTH ONCE DAILY -  TAKE  WITH  LASIX  (FUROSEMIDE) 05/15/18  Yes [place], Modena Nunnery, MD  prednisoLONE acetate (PRED FORTE) 1 % ophthalmic suspension INSTILL 1 DROP INTO LEFT EYE THREE TIMES DAILY AND TWICE DAILY IN THE RIGHT EYE 05/14/18  Yes [provider]  spironolactone (ALDACTONE) 25 MG tablet Take 25 mg by mouth daily.   Yes [provider]  tamsulosin (FLOMAX) 0.4 MG CAPS capsule Take 2 capsules (0.8 mg total) by mouth every evening. 06/09/16  Yes [place], Modena Nunnery, MD    Family History Family History  Problem Relation Age of Onset  . Diabetes Mother   . Heart failure Mother   . Hypertension Mother   . Diabetes Father   . Heart failure Father   . Hypertension Father   . Diabetes Sister   . Heart failure Sister   . Hypertension Sister   . Hyperlipidemia Sister   . Diabetes Brother   . Heart failure Brother   . Hypertension Brother      Social History Social History   Tobacco Use  . Smoking status: Former Smoker    Types: Cigarettes  . Smokeless tobacco: Never Used  Substance Use Topics  . Alcohol use: No  . Drug use: No     Allergies   Ivp dye [iodinated diagnostic agents]   Review of Systems Review of Systems  Cardiovascular: Positive for chest pain.  All other systems reviewed and are negative.    Physical Exam Updated Vital Signs BP (!) 152/59   Pulse 64   Temp 99.3 F (37.4 C) (Oral)   Resp 16   Ht 5\' 8"  (1.727 m)   Wt 123.6 kg (272 lb 7.8 oz)   SpO2 96%   BMI 41.43 kg/m   Physical Exam  Constitutional: He appears well-developed and  well-nourished.  HENT:  Head: Normocephalic and atraumatic.  Eyes: Conjunctivae are normal.  Neck: Normal range of motion.  Cardiovascular: Normal rate.  Pulmonary/Chest: Effort normal. No respiratory distress. He exhibits no tenderness.  Abdominal: Soft. Bowel sounds are normal. He exhibits no distension.  Musculoskeletal: Normal range of motion.  Neurological: He is alert.  Skin: Skin is warm and dry.  Nursing note and vitals reviewed.    ED Treatments / Results  Labs (all labs ordered are listed, but only abnormal results are displayed) Labs Reviewed  BASIC METABOLIC PANEL - Abnormal; Notable for the following components:      Result Value   BUN 24 (*)    Creatinine, Ser 1.66 (*)    GFR calc non Af Amer 39 (*)    GFR calc Af Amer 45 (*)    All other components within normal limits  CBC - Abnormal; Notable for the following components:   RBC 3.52 (*)    Hemoglobin 10.8 (*)    HCT 34.8 (*)    All other components within normal limits  MRSA PCR SCREENING  TROPONIN I  BRAIN NATRIURETIC PEPTIDE  TROPONIN I  MAGNESIUM  TROPONIN I  TROPONIN I    EKG EKG Interpretation  Date/Time:  Tuesday June 06 2018 18:59:57 EDT Ventricular Rate:  65 PR Interval:  210 QRS Duration: 88 QT Interval:  356 QTC Calculation: 370 R  Axis:   -45 Text Interpretation:  Sinus rhythm with 1st degree A-V block Left axis deviation Possible Anterior infarct , age undetermined Abnormal ECG new TWI in inferior leads and lateral leads compared to 2019 Confirmed by Merrily Pew [id number]) on 06/06/2018 7:52:36 PM   Radiology Dg Chest 2 View  Result Date: 06/06/2018 CLINICAL DATA:  Chest pain. EXAM: CHEST - 2 VIEW COMPARISON:  Radiograph of Mar 01, 2018. FINDINGS: Stable cardiomegaly. Sternotomy wires are noted. No pneumothorax or pleural effusion is noted. Both lungs are clear. The visualized skeletal structures are unremarkable. IMPRESSION: No active cardiopulmonary disease. Electronically Signed   By: Marijo Conception, M.D.   On: 06/06/2018 19:56    Procedures Procedures (including critical care time)  Medications Ordered in ED Medications  ondansetron (ZOFRAN) injection 4 mg (has no administration in time range)  tamsulosin (FLOMAX) capsule 0.8 mg (0.8 mg Oral Given 06/06/18 2310)  spironolactone (ALDACTONE) tablet 25 mg (has no administration in time range)  metoprolol tartrate (LOPRESSOR) tablet 25 mg (25 mg Oral Given 06/06/18 2310)  potassium chloride SA (K-DUR,KLOR-CON) CR tablet 20 mEq (has no administration in time range)  metFORMIN (GLUCOPHAGE) tablet 1,000 mg (has no administration in time range)  losartan (COZAAR) tablet 100 mg (has no administration in time range)  ipratropium-albuterol (DUONEB) 0.5-2.5 (3) MG/3ML nebulizer solution 3 mL (has no administration in time range)  furosemide (LASIX) tablet 20 mg (has no administration in time range)  dipyridamole-aspirin (AGGRENOX) 200-25 MG per 12 hr capsule 1 capsule (1 capsule Oral Given 05/07/27 3151)  folic acid (FOLVITE) tablet 1 mg (has no administration in time range)  polyvinyl alcohol (LIQUIFILM TEARS) 1.4 % ophthalmic solution 1 drop (has no administration in time range)  atorvastatin (LIPITOR) tablet 40 mg (40 mg Oral Given 06/06/18 2310)  acetaminophen (TYLENOL) tablet  650 mg (has no administration in time range)  prednisoLONE acetate (PRED FORTE) 1 % ophthalmic suspension 1 drop (1 drop Left Eye Given 06/07/18 0001)    And  prednisoLONE acetate (PRED FORTE) 1 % ophthalmic suspension 1 drop (1 drop Right Eye Given 06/07/18  0000)  ketorolac (ACULAR) 0.5 % ophthalmic solution 1 drop (1 drop Right Eye Given 06/07/18 0001)    And  ketorolac (ACULAR) 0.5 % ophthalmic solution 1 drop (1 drop Left Eye Given 06/07/18 0001)  nitroGLYCERIN (NITROGLYN) 2 % ointment 1 inch (1 inch Topical Given 06/06/18 2310)  insulin aspart protamine- aspart (NOVOLOG MIX 70/30) injection 40 Units (has no administration in time range)    And  insulin aspart protamine- aspart (NOVOLOG MIX 70/30) injection 50 Units (has no administration in time range)  aspirin EC tablet 81 mg (has no administration in time range)  nitroGLYCERIN (NITROSTAT) SL tablet 0.4 mg (has no administration in time range)  acetaminophen (TYLENOL) tablet 650 mg (has no administration in time range)  ondansetron (ZOFRAN) injection 4 mg (has no administration in time range)  enoxaparin (LOVENOX) injection 40 mg (has no administration in time range)  morphine 4 MG/ML injection 4 mg (has no administration in time range)  gi cocktail (Maalox,Lidocaine,Donnatal) (30 mLs Oral Given 06/06/18 2009)  aspirin chewable tablet 324 mg (324 mg Oral Given 06/06/18 2010)  famotidine (PEPCID) IVPB 20 mg premix (0 mg Intravenous Stopped 06/06/18 2234)     Initial Impression / Assessment and Plan / ED Course  I have reviewed the triage vital signs and the nursing notes.  Pertinent labs & imaging results that were available during my care of the patient were reviewed by me and considered in my medical decision making (see chart for details).     H/o cad, new TWI in inferior leads. Chest pain on left. Will admit for acs rule out/cardiology eval.   Final Clinical Impressions(s) / ED Diagnoses   Final diagnoses:  Nonspecific chest pain    ED  Discharge Orders    None       Alianah Lofton, Corene Cornea, MD 06/07/18 [phone]

## 2018-06-07 NOTE — Progress Notes (Addendum)
PROGRESS NOTE  [name]  ZES:[id number]  DOB: January 06, 1941  DOA: 06/06/2018 PCP: Alycia Rossetti, MD   Brief Admission Hx: [name] is a 77 y.o. male with medical history significant of C. Diff colitis, chronic kidney disease, CAD/CABG, type 2 diabetes, GERD, glaucoma, hypertension, peripheral vascular disease, sleep apnea on CPAP, history of stroke who is coming to the emergency department with complaints of intermittent chest pain.   MDM/Assessment & Plan:   1. Chest Pain with CAD - appreciate cardiology team consulting, they have started him on gentle IV fluids to improve creatinine in case he will need a cath.  His troponins have been negative x 3 and he is no longer having any chest pain symptoms.  Further recommendations to follow from [facility] team.   2. Essential Hypertension - blood pressures have been controlled.   3. 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 Monitor CBG.  Supplemental sliding scale coverage ordered.  4. Acute on chronic CKD stage 3 - gentle IV fluids ordered.  5. PVD - resumed home aggrenox and statin medication.   6. Hyperlipidemia - continue atorvastatin 40 mg daily.  7. Cerebrovascular disease with history of CVA - continue aspirin daily.  8. OSA - continue CPAP.  9. Chronic diastolic CHF - well compensated. 10. BPH - continue flomax daily.  11. Bradycardia - medication effect from beta blockade.   DVT prophylaxis: lovenox  Code Status: Full  Family Communication: patient  Disposition Plan: home when medically stable  Consultants:  [facility]   Subjective: Pt says that he no longer has any chest pain symptoms at this time.   Objective: Vitals:   06/07/18 0800 06/07/18 0900 06/07/18 1000 06/07/18 1100  BP: 125/62 135/62 (!) 127/54 126/71  Pulse: (!) 57 (!) 56 (!) 58 (!) 55  Resp: 17 14 16 18   Temp:      TempSrc:      SpO2: 98% 100% 98% 100%  Weight:      Height:         Intake/Output Summary (Last 24 hours) at 06/07/2018 1217 Last data filed at 06/07/2018 0900 Gross per 24 hour  Intake 290 ml  Output -  Net 290 ml   Filed Weights   06/06/18 1859 06/06/18 2219 06/07/18 0500  Weight: 122.9 kg (271 lb) 123.6 kg (272 lb 7.8 oz) 123.6 kg (272 lb 7.8 oz)   REVIEW OF SYSTEMS  As per history otherwise all reviewed and reported negative  Exam:  General exam: morbidly obese, chronically ill appearing male, NAD, cooperative and pleasant.  Respiratory system: shallow breathing but clear.  No increased work of breathing. Cardiovascular system: normal S1 & S2 heard. Gastrointestinal system: Abdomen is nondistended, soft and nontender. Normal bowel sounds heard. Central nervous system: Alert and oriented. No focal neurological deficits. Extremities: no cyanosis or clubbing.  Data Reviewed: Basic Metabolic Panel: Recent Labs  Lab 06/06/18 1919 06/06/18 2200 06/07/18 0424  NA 139  --  141  K 4.2  --  4.2  CL 105  --  105  CO2 27  --  26  GLUCOSE 94  --  81  BUN 24*  --  23  CREATININE 1.66*  --  1.73*  CALCIUM 9.3  --  9.3  MG  --  2.0  --    Liver Function Tests: No results for input(s): AST, ALT, ALKPHOS, BILITOT, PROT, ALBUMIN in the last 168 hours. No results  for input(s): LIPASE, AMYLASE in the last 168 hours. No results for input(s): AMMONIA in the last 168 hours. CBC: Recent Labs  Lab 06/06/18 1919  WBC 6.9  HGB 10.8*  HCT 34.8*  MCV 98.9  PLT 231   Cardiac Enzymes: Recent Labs  Lab 06/06/18 1919 06/06/18 2200 06/07/18 0424 06/07/18 1004  TROPONINI <0.03 <0.03 <0.03 <0.03   CBG (last 3)  Recent Labs    06/07/18 0739 06/07/18 1131  GLUCAP 144* 201*   Recent Results (from the past 240 hour(s))  MRSA PCR Screening     Status: None   Collection Time: 06/07/18 12:00 AM  Result Value Ref Range Status   MRSA by PCR NEGATIVE NEGATIVE Final    Comment:        The GeneXpert MRSA Assay (FDA approved for NASAL  specimens only), is one component of a comprehensive MRSA colonization surveillance program. It is not intended to diagnose MRSA infection nor to guide or monitor treatment for MRSA infections. Performed at [facility], [address], [place], [place] [address]     Studies: Dg Chest 2 View  Result Date: 06/06/2018 CLINICAL DATA:  Chest pain. EXAM: CHEST - 2 VIEW COMPARISON:  Radiograph of Mar 01, 2018. FINDINGS: Stable cardiomegaly. Sternotomy wires are noted. No pneumothorax or pleural effusion is noted. Both lungs are clear. The visualized skeletal structures are unremarkable. IMPRESSION: No active cardiopulmonary disease. Electronically Signed   By: Marijo Conception, M.D.   On: 06/06/2018 19:56   Scheduled Meds: . atorvastatin  40 mg Oral QPM  . dipyridamole-aspirin  1 capsule Oral BID  . enoxaparin (LOVENOX) injection  40 mg Subcutaneous Q24H  . folic acid  1 mg Oral Daily  . insulin aspart  0-15 Units Subcutaneous TID WC  . [START ON 06/08/2018] insulin aspart protamine- aspart  30 Units Subcutaneous Q breakfast   And  . insulin aspart protamine- aspart  30 Units Subcutaneous Q supper  . ketorolac  1 drop Right Eye BID   And  . ketorolac  1 drop Left Eye QID  . metoprolol tartrate  25 mg Oral BID  . potassium chloride  20 mEq Oral Daily  . prednisoLONE acetate  1 drop Left Eye TID   And  . prednisoLONE acetate  1 drop Right Eye BID  . spironolactone  25 mg Oral Daily  . tamsulosin  0.8 mg Oral QPM   Continuous Infusions: . sodium chloride 75 mL/hr at 06/07/18 1031    Principal Problem:   Chest pain Active Problems:   HTN (hypertension)   DM (diabetes mellitus) type II controlled with renal manifestation (H[facility])   PVD (peripheral vascular disease) (H[facility])   CAD (coronary artery disease)   Hyperlipidemia   History of stroke   OSA (obstructive sleep apnea)   Chronic diastolic heart failure (H[facility])   BPH (benign prostatic hyperplasia)  Critical Care Time spent: 31  mins  Irwin Brakeman, MD, FAAFP [facility] Pager [phone]  If 7PM-7AM, please contact night-coverage [email or web address] Password [email or web address] 06/07/2018, 12:17 PM    LOS: 0 days

## 2018-06-07 NOTE — Progress Notes (Signed)
  Echocardiogram 2D Echocardiogram has been performed.  [name] [name] 06/07/2018, 4:15 PM

## 2018-06-07 NOTE — Consult Note (Addendum)
Cardiology Consult    Patient ID: [name]; [id number]; 01-Mar-1941   Admit date: 06/06/2018 Date of Consult: 06/07/2018  Primary Care Provider: Alycia Rossetti, MD Primary Cardiologist: Rozann Lesches, MD    Patient Profile    [name] is a 77 y.o. male with past medical history of CAD (s/p CABG in 2007), chronic diastolic CHF, HTN, HLD, Type 2 DM, OSA (on CPAP) and prior CVA who is being seen today for the evaluation of chest pain at the request of Dr. Olevia Bowens.   History of Present Illness    [name] was last evaluated by Dr. Domenic Polite in 07/2017 and reported having baseline dyspnea on exertion but denied any recent chest discomfort.  A nuclear stress test was recommended for ischemic evaluation at that time and showed findings consistent with a small mild prior apical MI and mild peri-infarct ischemia but it was overall a low risk study and continued medical management was recommended.  His most recent admission was in 03/2018 for sepsis in the setting of PNA. Weight was overall stable at 279 lbs at the time of discharge he was continued on his prior to admission cardiac medications.  He presented to [facility] ED on 06/06/2018 for evaluation of chest discomfort and nausea. He reports being in his usual state of health until yesterday morning when he had frequent episodes of diarrhea. Denies any melena or hematochezia but says he did not pay very close attention to this.  Throughout the day, he continued to have frequent diarrhea and his abdominal discomfort radiated into his left pectoral region. Reports that episodes of discomfort would last for 5 to 10-minute intervals and this was not improved with the use of OTC antiacids. He had repeat episodes of diarrhea following supper and continued chest discomfort, therefore he came to the ED for further evaluation. Pain would occur at rest or with activity. He denies any recent orthopnea, PND, lower extremity edema, or palpitations. He  is not overly active at baseline but denies any recent changes in his respiratory status.  His cardiac history is complex as he did not have any anginal symptoms at the time of bypass in 2007. He had undergone stress testing for preoperative clearance prior to a cholecystectomy and was told that his stress test looked normal, but due to his significant family history of CAD a cardiac catheterization was pursued which showed three-vessel CAD.  Initial labs show WBC 6.9, Hgb 10.8 (close to baseline), platelets 231, Na+ 139, K+ 4.2, and creatinine 1.66 (at 1.29 on 05/22/2018).  BNP normal at 40. Initial and cyclic troponin values have been negative.  CXR with no active cardiopulmonary disease. EKG shows NSR, HR 65, with 1st degree AV block and TWI along the inferior leads which is new since prior tracings in 01/2018.    Past Medical History:  Diagnosis Date  . C. difficile diarrhea   . 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 Coronary artery disease    Status post CABG in 2007 [facility] [place])  . 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 GERD (gastroesophageal reflux disease)   . Glaucoma   . Hypertension   . Peripheral vascular disease ([place])   . Sleep apnea   . Stroke [facility]) 2008, 2014, 2015    Past Surgical History:  Procedure Laterality Date  . CHOLECYSTECTOMY    . CORONARY ARTERY BYPASS GRAFT    .  NO PAST SURGERIES    . THYROID SURGERY       Home Medications:  Prior to Admission medications   Medication Sig Start Date End Date Taking? Authorizing Provider  acetaminophen (TYLENOL) 650 MG CR tablet Take 1,300 mg by mouth every 8 (eight) hours as needed for pain.   Yes [provider]  Adalimumab 80 MG/0.8ML & 40MG /0.4ML PNKT Inject 1 Dose into the skin every 14 (fourteen) days. 09/21/17  Yes [provider]  atorvastatin (LIPITOR) 80 MG tablet Take 40 mg by mouth every evening.    Yes [provider]  Brinzolamide-Brimonidine (SIMBRINZA) 1-0.2 % SUSP Place 1 drop into the right eye 2 (two) times daily.    Yes [provider]  carboxymethylcellulose (REFRESH PLUS) 0.5 % SOLN Place 1 drop into both eyes 4 (four) times daily as needed.   Yes [provider]  clotrimazole-betamethasone (LOTRISONE) cream Apply 1 application topically 2 (two) times daily. Patient taking differently: Apply 1 application topically as needed.  12/02/17  Yes [place], Modena Nunnery, MD  dipyridamole-aspirin (AGGRENOX) 200-25 MG per 12 hr capsule Take 1 capsule by mouth 2 (two) times daily.   Yes [provider]  folic acid (FOLVITE) 1 MG tablet Take 1 mg by mouth daily.  03/14/17  Yes [provider]  furosemide (LASIX) 20 MG tablet Take 1 tablet (20 mg total) by mouth daily. 02/19/18  Yes Hayden Rasmussen, MD  insulin aspart protamine- aspart (NOVOLOG MIX 70/30) (70-30) 100 UNIT/ML injection Inject 40-50 Units into the skin See admin instructions. Patient takes 40 units in the morning and 50 units at night.    Yes [provider]  Ipratropium-Albuterol (COMBIVENT RESPIMAT) 20-100 MCG/ACT AERS respimat Inhale 1 puff into the lungs every 6 (six) hours as needed for wheezing or shortness of breath.    Yes [provider]  ketorolac (ACULAR) 0.5 % ophthalmic solution Place 2-4 drops into the right eye See admin instructions. 1 drop in the Right eye twice daily and one drop in the left eye 4 times daily 03/10/17  Yes [provider]  losartan (COZAAR) 100 MG tablet Take 100 mg by mouth daily.     Yes [provider]  metFORMIN (GLUCOPHAGE) 1000 MG tablet Take 1,000 mg by mouth 2 (two) times daily with a meal.     Yes [provider]  methotrexate (RHEUMATREX) 2.5 MG tablet Take 20 mg by mouth once a week. 06/23/17  Yes [provider]  metoprolol (LOPRESSOR) 50 MG tablet Take 0.5 tablets (25 mg total) by mouth 2 (two) times daily. FOR  HEART/BLOOD PRESSURE. HOLD FOR SYSTOLIC BLOOD PRESSURE LESS THAN 1OO/HEART RATE LESS THAN 60 05/20/11  Yes Annita Brod, MD  potassium chloride (K-DUR,KLOR-CON) 10 MEQ tablet TAKE 2 TABLETS BY MOUTH ONCE DAILY -  TAKE  WITH  LASIX  (FUROSEMIDE) 05/15/18  Yes [place], Modena Nunnery, MD  prednisoLONE acetate (PRED FORTE) 1 % ophthalmic suspension INSTILL 1 DROP INTO LEFT EYE THREE TIMES DAILY AND TWICE DAILY IN THE RIGHT EYE 05/14/18  Yes [provider]  spironolactone (ALDACTONE) 25 MG tablet Take 25 mg by mouth daily.   Yes [provider]  tamsulosin (FLOMAX) 0.4 MG CAPS capsule Take 2 capsules (0.8 mg total) by mouth every evening. 06/09/16  Yes [place], Modena Nunnery, MD    Inpatient Medications: Scheduled Meds: . atorvastatin  40 mg Oral QPM  . dipyridamole-aspirin  1 capsule Oral BID  . enoxaparin (LOVENOX) injection  40  mg Subcutaneous Q24H  . folic acid  1 mg Oral Daily  . furosemide  20 mg Oral Daily  . insulin aspart  0-15 Units Subcutaneous TID WC  . insulin aspart protamine- aspart  40 Units Subcutaneous Q breakfast   And  . insulin aspart protamine- aspart  50 Units Subcutaneous Q supper  . ketorolac  1 drop Right Eye BID   And  . ketorolac  1 drop Left Eye QID  . losartan  100 mg Oral Daily  . metoprolol tartrate  25 mg Oral BID  . potassium chloride  20 mEq Oral Daily  . prednisoLONE acetate  1 drop Left Eye TID   And  . prednisoLONE acetate  1 drop Right Eye BID  . spironolactone  25 mg Oral Daily  . tamsulosin  0.8 mg Oral QPM   Continuous Infusions: . sodium chloride     PRN Meds: acetaminophen, acetaminophen, ipratropium-albuterol, morphine injection, nitroGLYCERIN, ondansetron (ZOFRAN) IV, ondansetron (ZOFRAN) IV, polyvinyl alcohol  Allergies:    Allergies  Allergen Reactions  . Ivp Dye [Iodinated Diagnostic Agents] Rash    Social History:   Social History   Socioeconomic History  . Marital status: Married    Spouse name: Not on file  .  Number of children: Not on file  . Years of education: Not on file  . Highest education level: Not on file  Occupational History  . Not on file  Social Needs  . Financial resource strain: Not on file  . Food insecurity:    Worry: Not on file    Inability: Not on file  . Transportation needs:    Medical: Not on file    Non-medical: Not on file  Tobacco Use  . Smoking status: Former Smoker    Types: Cigarettes  . Smokeless tobacco: Never Used  Substance and Sexual Activity  . Alcohol use: No  . Drug use: No  . Sexual activity: Not Currently  Lifestyle  . Physical activity:    Days per week: Not on file    Minutes per session: Not on file  . Stress: Not on file  Relationships  . Social connections:    Talks on phone: Not on file    Gets together: Not on file    Attends religious service: Not on file    Active member of club or organization: Not on file    Attends meetings of clubs or organizations: Not on file    Relationship status: Not on file  . Intimate partner violence:    Fear of current or ex partner: Not on file    Emotionally abused: Not on file    Physically abused: Not on file    Forced sexual activity: Not on file  Other Topics Concern  . Not on file  Social History Narrative  . Not on file     Family History:    Family History  Problem Relation Age of Onset  . Diabetes Mother   . Heart failure Mother   . Hypertension Mother   . Diabetes Father   . Heart failure Father   . Hypertension Father   . Diabetes Sister   . Heart failure Sister   . Hypertension Sister   . Hyperlipidemia Sister   . Diabetes Brother   . Heart failure Brother   . Hypertension Brother       Review of Systems    General:  No chills, fever, night sweats or weight changes.  Cardiovascular:  No dyspnea  on exertion, edema, orthopnea, palpitations, paroxysmal nocturnal dyspnea. Positive for chest pain.  Dermatological: No rash, lesions/masses Respiratory: No cough,  dyspnea Urologic: No hematuria, dysuria Abdominal:   No nausea, vomiting, bright red blood per rectum, melena, or hematemesis. Positive for diarrhea.  Neurologic:  No visual changes, wkns, changes in mental status. All other systems reviewed and are otherwise negative except as noted above.  Physical Exam/Data    Vitals:   06/07/18 0400 06/07/18 0500 06/07/18 0600 06/07/18 0700  BP: 133/65 137/60 127/61   Pulse: 63 61 61   Resp: 17 13 14    Temp: (!) 97.4 F (36.3 C)   98.4 F (36.9 C)  TempSrc: Axillary   Axillary  SpO2: 95% 98% 94%   Weight:  272 lb 7.8 oz (123.6 kg)    Height:        Intake/Output Summary (Last 24 hours) at 06/07/2018 0838 Last data filed at 06/06/2018 2234 Gross per 24 hour  Intake 50 ml  Output -  Net 50 ml   Filed Weights   06/06/18 1859 06/06/18 2219 06/07/18 0500  Weight: 271 lb (122.9 kg) 272 lb 7.8 oz (123.6 kg) 272 lb 7.8 oz (123.6 kg)   Body mass index is 41.43 kg/m.   General: Pleasant, obese African American male appearing in NAD Psych: Normal affect. Neuro: Alert and oriented X 3. Moves all extremities spontaneously. HEENT: Normal  Neck: Supple without bruits or JVD. Lungs:  Resp regular and unlabored, CTA without wheezing or rales. Heart: RRR no s3, s4, or murmurs. Abdomen: Soft, non-tender, non-distended, BS + x 4.  Extremities: No clubbing, cyanosis or lower extremity edema. DP/PT/Radials 2+ and equal bilaterally.   Labs/Studies     Relevant CV Studies:  Echocardiogram: 01/2018 Study Conclusions  - Left ventricle: Images are limited despite use of Definity. The   cavity size was normal. Wall thickness was increased in a pattern   of moderate LVH. The estimated ejection fraction was   approximately 55%. Images were inadequate for LV wall motion   assessment. Indeterminate diastolic function. Rhythm looks to be   possible atrial flutter. - Aortic valve: Mildly calcified annulus. Trileaflet; mildly   calcified leaflets. -  Mitral valve: Mildly calcified annulus. There was trivial   regurgitation. - Right ventricle: Poorly visualized. The cavity size was normal. - Right atrium: Central venous pressure (est): 3 mm Hg. - Tricuspid valve: There was trivial regurgitation. - Pulmonary arteries: PA peak pressure: 30 mm Hg (S). - Pericardium, extracardiac: A prominent pericardial fat pad was   present.  NST: 07/2017  There was no ST segment deviation noted during stress.  The left ventricular ejection fraction is normal (55-65%).  Findings consistent with small mild prior apical myocardial infarction with mild peri-infarct ischemia.  This is a low risk study.   Laboratory Data:  Chemistry Recent Labs  Lab 06/06/18 1919 06/07/18 0424  NA 139 141  K 4.2 4.2  CL 105 105  CO2 27 26  GLUCOSE 94 81  BUN 24* 23  CREATININE 1.66* 1.73*  CALCIUM 9.3 9.3  GFRNONAA 39* 37*  GFRAA 45* 43*  ANIONGAP 7 10    No results for input(s): PROT, ALBUMIN, AST, ALT, ALKPHOS, BILITOT in the last 168 hours. Hematology Recent Labs  Lab 06/06/18 1919  WBC 6.9  RBC 3.52*  HGB 10.8*  HCT 34.8*  MCV 98.9  MCH 30.7  MCHC 31.0  RDW 15.5  PLT 231   Cardiac Enzymes Recent Labs  Lab 06/06/18 1919 06/06/18  2200 06/07/18 0424  TROPONINI <0.03 <0.03 <0.03   No results for input(s): TROPIPOC in the last 168 hours.  BNP Recent Labs  Lab 06/06/18 1919  BNP 40.0    DDimer No results for input(s): DDIMER in the last 168 hours.  Radiology/Studies:  Dg Chest 2 View  Result Date: 06/06/2018 CLINICAL DATA:  Chest pain. EXAM: CHEST - 2 VIEW COMPARISON:  Radiograph of Mar 01, 2018. FINDINGS: Stable cardiomegaly. Sternotomy wires are noted. No pneumothorax or pleural effusion is noted. Both lungs are clear. The visualized skeletal structures are unremarkable. IMPRESSION: No active cardiopulmonary disease. Electronically Signed   By: Marijo Conception, M.D.   On: 06/06/2018 19:56     Assessment & Plan    1. Atypical  Chest Pain - presented with episodes of left-sided chest discomfort throughout the day which started after having frequent diarrhea in the morning hours which persisted throughout the day. No association with exertion and symptoms did improve following administration of a GI cocktail while in the ED. Denies any repeat chest discomfort since admission. Initial and cyclic troponin values have been negative thus far. EKG on admission did show NSR, HR 65, with TWI along the inferior leads which is new since prior tracings in 01/2018. - his symptoms overall seem atypical for a cardiac etiology however the patient and his wife are very concerned about progressive CAD as he did not have any anginal symptoms at the time of requiring CABG in 2007 and NST leading up to his cath at that time showed no significant abnormalities. Repeat BMET this AM shows his creatinine has continued to trend upwards to 1.73. I reviewed this with them and recommended we start IVF at this time and repeat his EKG. Pending how he progresses today and if he develops recurrent symptoms, may need to consider a catheterization once kidney function improves. If he does require a cath in the future, would pre-treat for contrast allergy as he reports having a severe rash in the past.  - continue Aggrenox, BB, and statin therapy.   2. CAD - he is s/p CABG in 2007 which was performed at the [place]. NST in 07/2017 showed findings consistent with a small mild prior apical MI and mild peri-infarct ischemia but was overall a low risk study and continued medical management was recommended. - continue PTA Aggrenox (has remained on this given history of multiple CVA's), BB, and statin therapy.   3. Chronic Diastolic CHF - Echocardiogram in 01/2018 showed a preserved EF of 55%. BNP at 40 on admission. He denies any recent orthopnea, PND, or lower extremity edema. Appears euvolemic by examination today. Will hold PTA Lasix 20mg  daily due to his AKI.   4.  HTN - BP is well-controlled at 127/61 this morning. Continue PTA medications with the exception of holding Losartan in the setting of his AKI.   5. HLD - FLP in 10/2017 showed total cholesterol 115, HDL 51, triglycerides 98, and LDL 46.  At goal of LDL less than 70. Continue Atorvastatin 40mg  daily.   6. 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 Repeat BMET this AM shows creatinine has continued to trend upwards to 1.73.  Likely due to dehydration in the setting of frequent diarrhea. Will start IVF at 75 mL/hr.   7. Frequent Diarrhea - the patient reports his symptoms have now resolved. Further evaluation per admitting team.     For questions or updates,  please contact [place] Please consult [email or web address] for contact info under Cardiology/STEMI.  Signed, Erma Heritage, PA-C 06/07/2018, 8:38 AM Pager: [phone]  Attending note Patient seen and discussed with PA Ahmed Prima, I agree with her documentation. 77 yo male history of CAD with prior CABG in 2007 in [place], CKD, DM2, HTN, CVA admitted with abdominal pain, chest pain, and diarrhea  Symptoms originally started yesterday around noon. Pain in his lower abdomen, followed by diarrhea. Later in the day pain seemed to work its way up his abdomen and into his left chest. No assoicated SOB/DOE. Would last for about 2-3 minutes and then reoccur. Somewhat better with GI cocktail. Fell asleep last night, upon waking this morning completley resolved.    K 4.2, Cr 1.66, WBC 6.9 Hgb 10.8 Plt 231 BNP 40 Mg 2  Trop neg x 3 CXR no acute process  EKG SR, LAFB, nonspecific ST/T changes 01/2018 echo LVE 55%, cannot eval WWA 07/2017 nuclear stress apical infarct with mild peri-infarct ischemia. Low risk   Trop negative, EKG nonspecific findings. Atypical symptoms. Nuclear stress 07/2017 with just mild apical ischemia. We will obtain limited echo to reevaluate LV  function and wall motion. Quite a bit of anxiety from patient and family, patient had a normal stress test prior to his CABG and thus they have concerns about noninvasive testing. If symptoms remains atypical, without clear EKG or enzyme changes, and stable echo findings would not plan for further testing. Would monitor overnight for any chest pain recurrence since admitted lat last night with his symptoms, keep NPO in case testing becomes neccesary.    Carlyle Dolly MD

## 2018-06-08 DIAGNOSIS — R079 Chest pain, unspecified: Secondary | ICD-10-CM | POA: Diagnosis not present

## 2018-06-08 DIAGNOSIS — I5032 Chronic diastolic (congestive) heart failure: Secondary | ICD-10-CM

## 2018-06-08 DIAGNOSIS — I10 Essential (primary) hypertension: Secondary | ICD-10-CM

## 2018-06-08 DIAGNOSIS — N400 Enlarged prostate without lower urinary tract symptoms: Secondary | ICD-10-CM

## 2018-06-08 DIAGNOSIS — Z8673 Personal history of transient ischemic attack (TIA), and cerebral infarction without residual deficits: Secondary | ICD-10-CM

## 2018-06-08 DIAGNOSIS — R0789 Other chest pain: Secondary | ICD-10-CM | POA: Diagnosis not present

## 2018-06-08 DIAGNOSIS — G4733 Obstructive sleep apnea (adult) (pediatric): Secondary | ICD-10-CM | POA: Diagnosis not present

## 2018-06-08 DIAGNOSIS — I2510 Atherosclerotic heart disease of native coronary artery without angina pectoris: Secondary | ICD-10-CM

## 2018-06-08 DIAGNOSIS — E1122 Type 2 diabetes mellitus with diabetic chronic kidney disease: Secondary | ICD-10-CM | POA: Diagnosis not present

## 2018-06-08 DIAGNOSIS — I739 Peripheral vascular disease, unspecified: Secondary | ICD-10-CM | POA: Diagnosis not present

## 2018-06-08 DIAGNOSIS — N183 Chronic kidney disease, stage 3 (moderate): Secondary | ICD-10-CM | POA: Diagnosis not present

## 2018-06-08 LAB — BASIC METABOLIC PANEL
Anion gap: 6 (ref 5–15)
BUN: 20 mg/dL (ref 8–23)
CO2: 22 mmol/L (ref 22–32)
Calcium: 8.5 mg/dL — ABNORMAL LOW (ref 8.9–10.3)
Chloride: 110 mmol/L (ref 98–111)
Creatinine, Ser: 1.29 mg/dL — ABNORMAL HIGH (ref 0.61–1.24)
GFR calc Af Amer: >60 mL/min (ref >60)
GFR calc non Af Amer: 53 mL/min — ABNORMAL LOW (ref >60)
Glucose, Bld: 164 mg/dL — ABNORMAL HIGH (ref 70–99)
Potassium: 4.4 mmol/L (ref 3.5–5.1)
Sodium: 138 mmol/L (ref 135–145)

## 2018-06-08 LAB — GLUCOSE, CAPILLARY
Glucose-Capillary: 141 mg/dL — ABNORMAL HIGH (ref 70–99)
Glucose-Capillary: 163 mg/dL — ABNORMAL HIGH (ref 70–99)
Glucose-Capillary: 216 mg/dL — ABNORMAL HIGH (ref 70–99)

## 2018-06-08 MED ORDER — INSULIN ASPART PROT & ASPART (70-30 MIX) 100 UNIT/ML ~~LOC~~ SUSP
10.0000 [IU] | Freq: Every day | SUBCUTANEOUS | Status: DC
  Administered 2018-06-08: 10 [IU] via SUBCUTANEOUS
  Filled 2018-06-08: qty 10

## 2018-06-08 NOTE — Progress Notes (Signed)
Progress Note  Patient Name: [name] Date of Encounter: 06/08/2018  Primary Cardiologist: Rozann Lesches, MD   Subjective   Breathing is OK   NO CP    Inpatient Medications    Scheduled Meds: . atorvastatin  40 mg Oral QPM  . dipyridamole-aspirin  1 capsule Oral BID  . enoxaparin (LOVENOX) injection  40 mg Subcutaneous Q24H  . folic acid  1 mg Oral Daily  . insulin aspart  0-9 Units Subcutaneous TID WC  . insulin aspart protamine- aspart  10 Units Subcutaneous Q breakfast  . ketorolac  1 drop Right Eye BID   And  . ketorolac  1 drop Left Eye QID  . metoprolol tartrate  25 mg Oral BID  . potassium chloride  20 mEq Oral Daily  . prednisoLONE acetate  1 drop Left Eye TID   And  . prednisoLONE acetate  1 drop Right Eye BID  . tamsulosin  0.8 mg Oral QPM   Continuous Infusions: . sodium chloride 10 mL/hr at 06/08/18 1116   PRN Meds: acetaminophen, ipratropium-albuterol, morphine injection, nitroGLYCERIN, ondansetron (ZOFRAN) IV, polyvinyl alcohol   Vital Signs    Vitals:   06/08/18 0745 06/08/18 0952 06/08/18 1000 06/08/18 1100  BP:      Pulse: 60 63 62 (!) 58  Resp: 14  14 12   Temp:      TempSrc:      SpO2: 99%  99% 99%  Weight:      Height:        Intake/Output Summary (Last 24 hours) at 06/08/2018 1152 Last data filed at 06/08/2018 0515 Gross per 24 hour  Intake 1551.25 ml  Output 1300 ml  Net 251.25 ml   Filed Weights   06/06/18 1859 06/06/18 2219 06/07/18 0500  Weight: 122.9 kg 123.6 kg 123.6 kg    Telemetry    SR  50s  - Personally Reviewed  ECG    Not done  Personally Reviewed  Physical Exam   GEN: Obese 77 yo in no acute distress.   Neck: No JVD Cardiac: RRR, no murmurs, rubs, or gallops.  Respiratory: Clear to auscultation bilaterally. GI: Soft, nontender, non-distended  MS: Tr  edema; No deformity. Neuro:  Nonfocal  Psych: Normal affect   Labs    Chemistry Recent Labs  Lab 06/06/18 1919 06/07/18 0424 06/08/18 0637  NA  139 141 138  K 4.2 4.2 4.4  CL 105 105 110  CO2 27 26 22   GLUCOSE 94 81 164*  BUN 24* 23 20  CREATININE 1.66* 1.73* 1.29*  CALCIUM 9.3 9.3 8.5*  GFRNONAA 39* 37* 53*  GFRAA 45* 43* >60  ANIONGAP 7 10 6      Hematology Recent Labs  Lab 06/06/18 1919  WBC 6.9  RBC 3.52*  HGB 10.8*  HCT 34.8*  MCV 98.9  MCH 30.7  MCHC 31.0  RDW 15.5  PLT 231    Cardiac Enzymes Recent Labs  Lab 06/06/18 1919 06/06/18 2200 06/07/18 0424 06/07/18 1004  TROPONINI <0.03 <0.03 <0.03 <0.03   No results for input(s): TROPIPOC in the last 168 hours.   BNP Recent Labs  Lab 06/06/18 1919  BNP 40.0     DDimer No results for input(s): DDIMER in the last 168 hours.   Radiology    Dg Chest 2 View  Result Date: 06/06/2018 CLINICAL DATA:  Chest pain. EXAM: CHEST - 2 VIEW COMPARISON:  Radiograph of Mar 01, 2018. FINDINGS: Stable cardiomegaly. Sternotomy wires are noted. No pneumothorax or  pleural effusion is noted. Both lungs are clear. The visualized skeletal structures are unremarkable. IMPRESSION: No active cardiopulmonary disease. Electronically Signed   By: Marijo Conception, M.D.   On: 06/06/2018 19:56    Cardiac Studies   ------------------------------------------------------------------- LV EF: 55% -   60%  ------------------------------------------------------------------- Indications:      Chest pain 786.51.  ------------------------------------------------------------------- History:   PMH:   Coronary artery disease.  Stroke.  PMH:  CKD. GERD. Sleep Apnea.  Risk factors:  Hypertension. Diabetes mellitus.   ------------------------------------------------------------------- Study Conclusions  - Limited study to evaluate LV function. - Left ventricle: The cavity size was normal. Wall thickness was   increased in a pattern of severe LVH. Systolic function was   normal. The estimated ejection fraction was in the range of 55%   to 60%. - Aortic valve: Mildly calcified annulus.  Mildly thickened   leaflets. - Mitral valve: Mildly calcified annulus. Mildly thickened leaflets   . - Technically difficult study. Echocontrast was used to enhance   visualization.  ------------------------------------------------------------------- Study data:  Comparison was made to the study of 12/23/2017.  Study status:  Routine.  Procedure:  The patient reported no pain pre or post test. Transthoracic echocardiography. Image quality was adequate. Intravenous contrast (Definity) was administered.  Study completion:  There were no complications.          Transthoracic echocardiography.  M-mode, limited 2D, limited spectral Doppler, and color Doppler.  Birthdate:  Patient birthdate: 07-02-1941. Age:  Patient is 78 yr old.  Sex:  Gender: male.    BMI: 41.4 kg/m^2.  Blood pressure:     131/72  Patient status:  Inpatient. Study date:  Study date: 06/07/2018. Study time: 03:21 PM. Location:  ICU/CCU  -------------------------------------------------------------------  ------------------------------------------------------------------- Left ventricle:  The cavity size was normal. Wall thickness was increased in a pattern of severe LVH. Systolic function was normal. The estimated ejection fraction was in the range of 55% to 60%. Images were inadequate for LV wall motion assessment.  ------------------------------------------------------------------- Aortic valve:   Mildly calcified annulus. Mildly thickened leaflets.  ------------------------------------------------------------------- Aorta:  Aortic root: The aortic root was normal in size.  ------------------------------------------------------------------- Mitral valve:   Mildly calcified annulus. Mildly thickened leaflets .  Doppler:     Peak gradient (D): 4 mm Hg.  ------------------------------------------------------------------- Measurements   Left ventricle                         Value        Reference  LV ID,  ED, PLAX chordal                44.7  mm     43 - 52  LV ID, ES, PLAX chordal                24.3  mm     23 - 38  LV PW thickness, ED                    15    mm     ---------  IVS/LV PW ratio, ED                    1            <=1.3    Ventricular septum                     Value        Reference  IVS thickness, ED                      15    mm     ---------    LVOT                                   Value        Reference  LVOT ID, S                             21    mm     ---------  LVOT area                              3.46  cm^2   ---------  LVOT peak velocity, S                  76.64 cm/s   ---------  LVOT VTI, S                            17.44 cm     ---------  Stroke volume (SV), LVOT DP            60.4  ml     ---------  Stroke index (SV/bsa), LVOT DP         24.2  ml/m^2 ---------    Aorta                                  Value        Reference  Aortic root ID, ED                     37    mm     ---------  Ascending aorta ID, A-P, S             30    mm     ---------    Mitral valve                           Value        Reference  Mitral E-wave peak velocity            96    cm/s   ---------  Mitral A-wave peak velocity            77.6  cm/s   ---------  Mitral deceleration time               229   ms     150 - 230  Mitral peak gradient, D                4     mm Hg  ---------  Mitral E/A ratio, peak                 1.2          ---------  Legend: (L)  and  (H)  mark values outside specified reference range.  ------------------------------------------------------------------- Prepared and Electronically Authenticated by  Kerry Hough, M.D. 2019-08-07T16:34:50  Patient Profile     [name] is a 77 y.o. male with past medical history of  CAD (s/p CABG in 2007), chronic diastolic CHF, HTN, HLD, Type 2 DM, OSA (on CPAP) and prior CVA who is being seen today for the evaluation of chest pain at the request of Dr. Olevia Bowens.   Assessment & Plan    1  CP    Atypical for angina   Has r/o for MI   Echo with normal LVEF I would recomm ambulating   If does OK then OK to go home    2  CAD  CABG in 2007 at [place] stress test 2018 with small apical infarct   Minimal periinfarct ischemia  3  Chronic diastolic CHF   Volume status is OK  4  HTN  BP is a llittle high   Systolic then diastoilf   Losartan held    May benefit from hydralazine instead with labile renal funciton   5  CKD  Cr better at 1.29   See above   WIll need to be followed closely.    6  HL  COntinue statin  If ambulates with no pain then can go home today   For questions or updates, please contact [facility] Please consult [email or web address] for contact info under Cardiology/STEMI.      Signed, Dorris Carnes, MD  06/08/2018, 11:52 AM

## 2018-06-08 NOTE — Progress Notes (Signed)
Per cardiology, if pt does well with ambulation able to be discharged. Pt ambulated around unit twice without complications. No complaint of chest pain or SOB. HR maintained in 70's. Per pt, usually ambulates at home every morning for 20 minutes. Dr. Wynetta Emery notified.

## 2018-06-08 NOTE — Discharge Summary (Signed)
Physician Discharge Summary  [name] CWC:[id number] DOB: Aug 27, 1941 DOA: 06/06/2018  PCP: Alycia Rossetti, MD Cardiologist: Dennard Schaumann date: 06/06/2018 Discharge date: 06/08/2018  Admitted From: Home  Disposition: Home   Recommendations for Outpatient Follow-up:  1. Follow up with PCP in 1 weeks 2. Follow up with cardiology in 2 weeks  Discharge Condition: STABLE   CODE STATUS: FULL    Brief Hospitalization Summary: Please see all hospital notes, images, labs for full details of the hospitalization.  HPI: [name] is a 77 y.o. male with medical history significant of C. Diff colitis, chronic kidney disease, CAD/CABG, type 2 diabetes, GERD, glaucoma, hypertension, peripheral vascular disease, sleep apnea on CPAP, history of stroke who is coming to the emergency department with complaints of on and off chest pain since earlier today.  He denies dyspnea, palpitations, diaphoresis, dizziness, nausea, emesis, PND orthopnea.  He occasionally gets pitting edema of the lower extremities, particularly on the extremities that the graft was taken for his CABG.  He denies fever, chills, sore throat, wheezing, hemoptysis, diarrhea, constipation, melena or hematochezia.  Denies dysuria, frequency or hematuria.  No polyuria, polydipsia, polyphagia or blurred vision.  No heat or cold intolerance.  Denies pruritus or rashes.  The patient and his wife is concerned because he states that the patient had a benign stress test before his angiogram showed multiple coronary artery stenosis and he had to go for CABG.  ED Course: His temperature was 98.3 F, pulse 68, respirations 14, blood pressure 153/70 mmHg and O2 sat 100% on room air.  He received a GI cocktail in the emergency department.  Given a GI cocktail in the ED.  I started the patient on Nitropaste.  His white count 6.9, hemoglobin 10.8 and platelets 231.  Sodium 139, potassium 4.2, chloride 105 CO2 27 mmol/L.  BUN was 24, creatinine 1.66,  calcium 9.3, magnesium 2.0 and glucose 94 mg/dL.  Troponin level was normal.  BNP was 40.0 pg/mL.  EKG was sinus rhythm with first-degree AV block, left axis deviation old anterior infarct.  His chest radiograph did not show any acute cardiopulmonary pathology.  Brief Admission Hx: [name] a 77 y.o.malewith medical history significant ofC. Diffcolitis, chronic kidney disease, CAD/CABG, type 2 diabetes, GERD, glaucoma, hypertension, peripheral vascular disease, sleep apnea on CPAP, history of stroke who is coming to the emergency department with complaints of intermittent chest pain.   MDM/Assessment & Plan:   1. Chest Pain with CAD - appreciate cardiology team consulting,  His troponins have been negative x 3 and he is no longer having any chest pain symptoms.  He was monitored overnight and remained chest pain free.  He ambulated and remained chest pain free.  Cardiology saw him and said that he was safe to discharge home. Follow up with cardiology in 2 weeks.    2. Essential Hypertension - blood pressures have been controlled.   3. 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   4. Acute on chronic CKD stage 3 - gentle IV fluids ordered and creatinine improved.   5. PVD - resumed home aggrenox and statin medication.   6. Hyperlipidemia - continue atorvastatin 40 mg daily.  7. Cerebrovascular disease with history of CVA - continue aspirin daily.  8. OSA - continue CPAP.  9. Chronic diastolic CHF - well compensated. 10. BPH - continue flomax daily.  11. Bradycardia - medication effect from beta blockade.   DVT prophylaxis:  lovenox  Code Status: Full  Family Communication: patient  Disposition Plan: home   Consultants:  HeartCare   Discharge Diagnoses:  Principal Problem:   Chest pain Active Problems:   HTN (hypertension)   DM (diabetes mellitus) type II controlled with renal manifestation (HCC)   PVD (peripheral vascular disease) (HCC)   CAD  (coronary artery disease)   Hyperlipidemia   History of stroke   OSA (obstructive sleep apnea)   Chronic diastolic heart failure (HCC)   BPH (benign prostatic hyperplasia)  Discharge Instructions: Discharge Instructions    Call MD for:  difficulty breathing, headache or visual disturbances   Complete by:  As directed    Call MD for:  extreme fatigue   Complete by:  As directed    Call MD for:  persistant dizziness or light-headedness   Complete by:  As directed    Call MD for:  persistant nausea and vomiting   Complete by:  As directed    Call MD for:  severe uncontrolled pain   Complete by:  As directed    Diet - low sodium heart healthy   Complete by:  As directed    Increase activity slowly   Complete by:  As directed      Allergies as of 06/08/2018      Reactions   Ivp Dye [iodinated Diagnostic Agents] Rash      Medication List    TAKE these medications   acetaminophen 650 MG CR tablet Commonly known as:  TYLENOL Take 1,300 mg by mouth every 8 (eight) hours as needed for pain.   Adalimumab 80 MG/0.8ML & 40MG /0.4ML Pnkt Inject 1 Dose into the skin every 14 (fourteen) days.   atorvastatin 80 MG tablet Commonly known as:  LIPITOR Take 40 mg by mouth every evening.   carboxymethylcellulose 0.5 % Soln Commonly known as:  REFRESH PLUS Place 1 drop into both eyes 4 (four) times daily as needed.   clotrimazole-betamethasone cream Commonly known as:  LOTRISONE Apply 1 application topically 2 (two) times daily. What changed:    when to take this  reasons to take this   COMBIVENT RESPIMAT 20-100 MCG/ACT Aers respimat Generic drug:  Ipratropium-Albuterol Inhale 1 puff into the lungs every 6 (six) hours as needed for wheezing or shortness of breath.   dipyridamole-aspirin 200-25 MG 12hr capsule Commonly known as:  AGGRENOX Take 1 capsule by mouth 2 (two) times daily.   folic acid 1 MG tablet Commonly known as:  FOLVITE Take 1 mg by mouth daily.   furosemide  20 MG tablet Commonly known as:  LASIX Take 1 tablet (20 mg total) by mouth daily.   insulin aspart protamine- aspart (70-30) 100 UNIT/ML injection Commonly known as:  NOVOLOG MIX 70/30 Inject 40-50 Units into the skin See admin instructions. Patient takes 40 units in the morning and 50 units at night.   ketorolac 0.5 % ophthalmic solution Commonly known as:  ACULAR Place 2-4 drops into the right eye See admin instructions. 1 drop in the Right eye twice daily and one drop in the left eye 4 times daily   losartan 100 MG tablet Commonly known as:  COZAAR Take 100 mg by mouth daily.   metFORMIN 1000 MG tablet Commonly known as:  GLUCOPHAGE Take 1,000 mg by mouth 2 (two) times daily with a meal.   methotrexate 2.5 MG tablet Commonly known as:  RHEUMATREX Take 20 mg by mouth once a week.   metoprolol tartrate 50 MG tablet Commonly known as:  LOPRESSOR Take 0.5 tablets (25 mg total) by mouth 2 (two) times daily. FOR HEART/BLOOD PRESSURE. HOLD FOR SYSTOLIC BLOOD PRESSURE LESS THAN 1OO/HEART RATE LESS THAN 60   potassium chloride 10 MEQ tablet Commonly known as:  K-DUR,KLOR-CON TAKE 2 TABLETS BY MOUTH ONCE DAILY -  TAKE  WITH  LASIX  (FUROSEMIDE)   prednisoLONE acetate 1 % ophthalmic suspension Commonly known as:  PRED FORTE INSTILL 1 DROP INTO LEFT EYE THREE TIMES DAILY AND TWICE DAILY IN THE RIGHT EYE   SIMBRINZA 1-0.2 % Susp Generic drug:  Brinzolamide-Brimonidine Place 1 drop into the right eye 2 (two) times daily.   spironolactone 25 MG tablet Commonly known as:  ALDACTONE Take 25 mg by mouth daily.   tamsulosin 0.4 MG Caps capsule Commonly known as:  FLOMAX Take 2 capsules (0.8 mg total) by mouth every evening.      Follow-up Information    P[place]ton, Modena Nunnery, MD Follow up.   Specialty:  Family Medicine Why:  Hospital Follow Up  Contact information: [address], [place] [address] [phone]        Satira Sark, MD. Schedule an  appointment as soon as possible for a visit in 2 week(s).   Specialty:  Cardiology Why:  Hospital Follow Up  Contact information: [place] [address] [phone]          Allergies  Allergen Reactions  . Ivp Dye [Iodinated Diagnostic Agents] Rash   Allergies as of 06/08/2018      Reactions   Ivp Dye [iodinated Diagnostic Agents] Rash      Medication List    TAKE these medications   acetaminophen 650 MG CR tablet Commonly known as:  TYLENOL Take 1,300 mg by mouth every 8 (eight) hours as needed for pain.   Adalimumab 80 MG/0.8ML & 40MG /0.4ML Pnkt Inject 1 Dose into the skin every 14 (fourteen) days.   atorvastatin 80 MG tablet Commonly known as:  LIPITOR Take 40 mg by mouth every evening.   carboxymethylcellulose 0.5 % Soln Commonly known as:  REFRESH PLUS Place 1 drop into both eyes 4 (four) times daily as needed.   clotrimazole-betamethasone cream Commonly known as:  LOTRISONE Apply 1 application topically 2 (two) times daily. What changed:    when to take this  reasons to take this   COMBIVENT RESPIMAT 20-100 MCG/ACT Aers respimat Generic drug:  Ipratropium-Albuterol Inhale 1 puff into the lungs every 6 (six) hours as needed for wheezing or shortness of breath.   dipyridamole-aspirin 200-25 MG 12hr capsule Commonly known as:  AGGRENOX Take 1 capsule by mouth 2 (two) times daily.   folic acid 1 MG tablet Commonly known as:  FOLVITE Take 1 mg by mouth daily.   furosemide 20 MG tablet Commonly known as:  LASIX Take 1 tablet (20 mg total) by mouth daily.   insulin aspart protamine- aspart (70-30) 100 UNIT/ML injection Commonly known as:  NOVOLOG MIX 70/30 Inject 40-50 Units into the skin See admin instructions. Patient takes 40 units in the morning and 50 units at night.   ketorolac 0.5 % ophthalmic solution Commonly known as:  ACULAR Place 2-4 drops into the right eye See admin instructions. 1 drop in the Right eye twice daily and  one drop in the left eye 4 times daily   losartan 100 MG tablet Commonly known as:  COZAAR Take 100 mg by mouth daily.   metFORMIN 1000 MG tablet Commonly known as:  GLUCOPHAGE Take 1,000  mg by mouth 2 (two) times daily with a meal.   methotrexate 2.5 MG tablet Commonly known as:  RHEUMATREX Take 20 mg by mouth once a week.   metoprolol tartrate 50 MG tablet Commonly known as:  LOPRESSOR Take 0.5 tablets (25 mg total) by mouth 2 (two) times daily. FOR HEART/BLOOD PRESSURE. HOLD FOR SYSTOLIC BLOOD PRESSURE LESS THAN 1OO/HEART RATE LESS THAN 60   potassium chloride 10 MEQ tablet Commonly known as:  K-DUR,KLOR-CON TAKE 2 TABLETS BY MOUTH ONCE DAILY -  TAKE  WITH  LASIX  (FUROSEMIDE)   prednisoLONE acetate 1 % ophthalmic suspension Commonly known as:  PRED FORTE INSTILL 1 DROP INTO LEFT EYE THREE TIMES DAILY AND TWICE DAILY IN THE RIGHT EYE   SIMBRINZA 1-0.2 % Susp Generic drug:  Brinzolamide-Brimonidine Place 1 drop into the right eye 2 (two) times daily.   spironolactone 25 MG tablet Commonly known as:  ALDACTONE Take 25 mg by mouth daily.   tamsulosin 0.4 MG Caps capsule Commonly known as:  FLOMAX Take 2 capsules (0.8 mg total) by mouth every evening.      Procedures/Studies: Dg Chest 2 View  Result Date: 06/06/2018 CLINICAL DATA:  Chest pain. EXAM: CHEST - 2 VIEW COMPARISON:  Radiograph of Mar 01, 2018. FINDINGS: Stable cardiomegaly. Sternotomy wires are noted. No pneumothorax or pleural effusion is noted. Both lungs are clear. The visualized skeletal structures are unremarkable. IMPRESSION: No active cardiopulmonary disease. Electronically Signed   By: Marijo Conception, M.D.   On: 06/06/2018 19:56      Subjective: Pt has had no further chest pain or shortness of breath.  He ambulated well without chest pain or SOB and doesn't need supplemental oxygen.    Discharge Exam: Vitals:   06/08/18 1000 06/08/18 1100  BP:    Pulse: 62 (!) 58  Resp: 14 12  Temp:    SpO2:  99% 99%   Vitals:   06/08/18 0745 06/08/18 0952 06/08/18 1000 06/08/18 1100  BP:      Pulse: 60 63 62 (!) 58  Resp: 14  14 12   Temp:      TempSrc:      SpO2: 99%  99% 99%  Weight:      Height:       General exam: morbidly obese, chronically ill appearing male, NAD, cooperative and pleasant.  Respiratory system: shallow breathing but clear.  No increased work of breathing. Cardiovascular system: normal S1 & S2 heard. Gastrointestinal system: Abdomen is nondistended, soft and nontender. Normal bowel sounds heard. Central nervous system: Alert and oriented. No focal neurological deficits. Extremities: no cyanosis or clubbing   The results of significant diagnostics from this hospitalization (including imaging, microbiology, ancillary and laboratory) are listed below for reference.     Microbiology: Recent Results (from the past 240 hour(s))  MRSA PCR Screening     Status: None   Collection Time: 06/07/18 12:00 AM  Result Value Ref Range Status   MRSA by PCR NEGATIVE NEGATIVE Final    Comment:        The GeneXpert MRSA Assay (FDA approved for NASAL specimens only), is one component of a comprehensive MRSA colonization surveillance program. It is not intended to diagnose MRSA infection nor to guide or monitor treatment for MRSA infections. Performed at [facility], [address] [place], [place] [address]      Labs: BNP (last 3 results) Recent Labs    02/19/18 0855 02/24/18 1103 06/06/18 1919  BNP 62.0 54.0 40.0  Basic Metabolic Panel: Recent Labs  Lab 06/06/18 1919 06/06/18 2200 06/07/18 0424 06/08/18 0637  NA 139  --  141 138  K 4.2  --  4.2 4.4  CL 105  --  105 110  CO2 27  --  26 22  GLUCOSE 94  --  81 164*  BUN 24*  --  23 20  CREATININE 1.66*  --  1.73* 1.29*  CALCIUM 9.3  --  9.3 8.5*  MG  --  2.0  --   --    Liver Function Tests: No results for input(s): AST, ALT, ALKPHOS, BILITOT, PROT, ALBUMIN in the last 168 hours. No results for  input(s): LIPASE, AMYLASE in the last 168 hours. No results for input(s): AMMONIA in the last 168 hours. CBC: Recent Labs  Lab 06/06/18 1919  WBC 6.9  HGB 10.8*  HCT 34.8*  MCV 98.9  PLT 231   Cardiac Enzymes: Recent Labs  Lab 06/06/18 1919 06/06/18 2200 06/07/18 0424 06/07/18 1004  TROPONINI <0.03 <0.03 <0.03 <0.03   BNP: Invalid input(s): POCBNP CBG: Recent Labs  Lab 06/07/18 1811 06/07/18 2214 06/08/18 0407 06/08/18 0734 06/08/18 1136  GLUCAP 103* 123* 141* 163* 216*   D-Dimer No results for input(s): DDIMER in the last 72 hours. Hgb A1c No results for input(s): HGBA1C in the last 72 hours. Lipid Profile No results for input(s): CHOL, HDL, LDLCALC, TRIG, CHOLHDL, LDLDIRECT in the last 72 hours. Thyroid function studies No results for input(s): TSH, T4TOTAL, T3FREE, THYROIDAB in the last 72 hours.  Invalid input(s): FREET3 Anemia work up No results for input(s): VITAMINB12, FOLATE, FERRITIN, TIBC, IRON, RETICCTPCT in the last 72 hours. Urinalysis    Component Value Date/Time   COLORURINE YELLOW 02/24/2018 1047   APPEARANCEUR HAZY (A) 02/24/2018 1047   LABSPEC 1.014 02/24/2018 1047   PHURINE 6.0 02/24/2018 1047   GLUCOSEU NEGATIVE 02/24/2018 1047   HGBUR SMALL (A) 02/24/2018 1047   BILIRUBINUR NEGATIVE 02/24/2018 1047   KETONESUR NEGATIVE 02/24/2018 1047   PROTEINUR NEGATIVE 02/24/2018 1047   UROBILINOGEN 0.2 03/28/2014 0145   NITRITE NEGATIVE 02/24/2018 1047   LEUKOCYTESUR NEGATIVE 02/24/2018 1047   Sepsis Labs Invalid input(s): PROCALCITONIN,  WBC,  LACTICIDVEN Microbiology Recent Results (from the past 240 hour(s))  MRSA PCR Screening     Status: None   Collection Time: 06/07/18 12:00 AM  Result Value Ref Range Status   MRSA by PCR NEGATIVE NEGATIVE Final    Comment:        The GeneXpert MRSA Assay (FDA approved for NASAL specimens only), is one component of a comprehensive MRSA colonization surveillance program. It is not intended to  diagnose MRSA infection nor to guide or monitor treatment for MRSA infections. Performed at [facility], [address], [place], [place] [address]    Time coordinating discharge:   SIGNED:  Irwin Brakeman, MD  [facility] 06/08/2018, 12:55 PM Pager [phone]  If 7PM-7AM, please contact night-coverage [email or web address] Password [email or web address]

## 2018-06-13 ENCOUNTER — Other Ambulatory Visit: Payer: Self-pay

## 2018-06-13 ENCOUNTER — Ambulatory Visit (HOSPITAL_COMMUNITY)
Admission: RE | Admit: 2018-06-13 | Discharge: 2018-06-13 | Disposition: A | Discharge status: Home / Self Care | Payer: Medicare HMO | Source: Ambulatory Visit | Attending: Family Medicine | Admitting: Family Medicine

## 2018-06-13 ENCOUNTER — Ambulatory Visit (INDEPENDENT_AMBULATORY_CARE_PROVIDER_SITE_OTHER): Payer: Medicare HMO | Admitting: Family Medicine

## 2018-06-13 VITALS — BP 122/56 | HR 63 | Temp 98.6°F | Resp 16 | Wt 277.1 lb

## 2018-06-13 DIAGNOSIS — Z09 Encounter for follow-up examination after completed treatment for conditions other than malignant neoplasm: Secondary | ICD-10-CM

## 2018-06-13 DIAGNOSIS — R1031 Right lower quadrant pain: Secondary | ICD-10-CM | POA: Insufficient documentation

## 2018-06-13 DIAGNOSIS — R1030 Lower abdominal pain, unspecified: Secondary | ICD-10-CM | POA: Diagnosis not present

## 2018-06-13 LAB — URINALYSIS, ROUTINE W REFLEX MICROSCOPIC
Bilirubin Urine: NEGATIVE
Glucose, UA: NEGATIVE
Hgb urine dipstick: NEGATIVE
Ketones, ur: NEGATIVE
Leukocytes, UA: NEGATIVE
Nitrite: NEGATIVE
Protein, ur: NEGATIVE
Specific Gravity, Urine: 1.015 (ref 1.001–1.03)
pH: 5 (ref 5.0–8.0)

## 2018-06-13 MED ORDER — DICYCLOMINE HCL 20 MG PO TABS: 10.0000 mg | ORAL_TABLET | Freq: Three times a day (TID) | ORAL | 0 refills | Status: DC | PRN

## 2018-06-13 MED ORDER — PANTOPRAZOLE SODIUM 20 MG PO TBEC
20.0000 mg | DELAYED_RELEASE_TABLET | Freq: Every day | ORAL | 0 refills | Status: DC
Start: 2018-06-13 — End: 2020-03-07

## 2018-06-13 NOTE — Progress Notes (Deleted)
Patient ID: [name] MRN: [id number], DOB: 01-Apr-1941, 77 y.o. Date of Encounter: 06/13/18    Chief Complaint:  Chief Complaint  Patient presents with  . Hospitalization Follow-up    Patient also has c/o of right lower abdominal pain. Patient describes as gas pain    SUBJECTIVE:   HPI: [name] is a 77 y.o. year old male  presents with hospital follow up after recent admission for diarrhea and CP with negative r/o for ACS, is following up with cardiology, here to follow up on abdominal pain.  Patient reports abdominal pain associated with his frequent bowel movements which was part of why he went into the ER on 06/06/18.  After being briefly mentioned one time in one of the notes from his hospital admission, he is only noted to have normal abdominal exams and no complaints of abdominal pain or diarrhea.  Today he and his wife report vague intermittent mild to moderate abd pain occurring in various locations briefly that feel like gas.  His BM's have returned to normal.  He denies cramping, N, V.  No sharp pains, no radiation of pains to chest or back.  No urinary sx.  No melena, no hematochezia.  He denies heart burn, dyspepsia, bloating.  No CP, SOB, palpitations.    He had AKI which resolved with fluids, negative cardiac work-up, negative chest x-ray Review of hospital records shows echo was significant for severe LVH with preserved systolic function LVEF 98-33%.  AKI with CKD stage 3, Cr returned to baseline 1.29.    Past Medical History:  Diagnosis Date  . C. difficile diarrhea   . 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 Coronary artery disease    Status post CABG in 2007 [facility] [place])  . Diabetes mellitus with stage 1 chronic kidney disease ([place]) 05/20/2011  . GERD (gastroesophageal reflux disease)   . Glaucoma   . Hypertension   . Peripheral vascular disease ([place])   . Sleep apnea   . Stroke [facility]) 2008, 2014, 2015      Home Meds: Outpatient Medications Prior to Visit  Medication Sig Dispense Refill  . acetaminophen (TYLENOL) 650 MG CR tablet Take 1,300 mg by mouth every 8 (eight) hours as needed for pain.    . Adalimumab 80 MG/0.8ML & 40MG /0.4ML PNKT Inject 1 Dose into the skin every 14 (fourteen) days.    [name] atorvastatin (LIPITOR) 80 MG tablet Take 40 mg by mouth every evening.     . Brinzolamide-Brimonidine (SIMBRINZA) 1-0.2 % SUSP Place 1 drop into the right eye 2 (two) times daily.     . carboxymethylcellulose (REFRESH PLUS) 0.5 % SOLN Place 1 drop into both eyes 4 (four) times daily as needed.    . clotrimazole-betamethasone (LOTRISONE) cream Apply 1 application topically 2 (two) times daily. (Patient taking differently: Apply 1 application topically as needed. ) 30 g 1  . dipyridamole-aspirin (AGGRENOX) 200-25 MG per 12 hr capsule Take 1 capsule by mouth 2 (two) times daily.    . folic acid (FOLVITE) 1 MG tablet Take 1 mg by mouth daily.     . furosemide (LASIX) 20 MG tablet Take 1 tablet (20 mg total) by mouth daily. 30 tablet 0  . insulin aspart protamine- aspart (NOVOLOG MIX 70/30) (70-30) 100 UNIT/ML injection Inject 40-50 Units into the skin See admin instructions. Patient takes 40 units in the morning and 50 units at night.     [name]  Ipratropium-Albuterol (COMBIVENT RESPIMAT) 20-100 MCG/ACT AERS respimat Inhale 1 puff into the lungs every 6 (six) hours as needed for wheezing or shortness of breath.     [name] ketorolac (ACULAR) 0.5 % ophthalmic solution Place 2-4 drops into the right eye See admin instructions. 1 drop in the Right eye twice daily and one drop in the left eye 4 times daily    . losartan (COZAAR) 100 MG tablet Take 100 mg by mouth daily.      . metFORMIN (GLUCOPHAGE) 1000 MG tablet Take 1,000 mg by mouth 2 (two) times daily with a meal.      . methotrexate (RHEUMATREX) 2.5 MG tablet Take 20 mg by mouth once a week.    . metoprolol (LOPRESSOR) 50 MG tablet Take 0.5 tablets (25 mg total) by  mouth 2 (two) times daily. FOR HEART/BLOOD PRESSURE. HOLD FOR SYSTOLIC BLOOD PRESSURE LESS THAN 1OO/HEART RATE LESS THAN 60    . potassium chloride (K-DUR,KLOR-CON) 10 MEQ tablet TAKE 2 TABLETS BY MOUTH ONCE DAILY -  TAKE  WITH  LASIX  (FUROSEMIDE) 60 tablet 2  . prednisoLONE acetate (PRED FORTE) 1 % ophthalmic suspension INSTILL 1 DROP INTO LEFT EYE THREE TIMES DAILY AND TWICE DAILY IN THE RIGHT EYE  11  . spironolactone (ALDACTONE) 25 MG tablet Take 25 mg by mouth daily.    . tamsulosin (FLOMAX) 0.4 MG CAPS capsule Take 2 capsules (0.8 mg total) by mouth every evening. 60 capsule 3   No facility-administered medications prior to visit.     Allergies:  Allergies  Allergen Reactions  . Ivp Dye [Iodinated Diagnostic Agents] Rash    Social History   Socioeconomic History  . Marital status: Married    Spouse name: Not on file  . Number of children: Not on file  . Years of education: Not on file  . Highest education level: Not on file  Occupational History  . Not on file  Social Needs  . Financial resource strain: Not on file  . Food insecurity:    Worry: Not on file    Inability: Not on file  . Transportation needs:    Medical: Not on file    Non-medical: Not on file  Tobacco Use  . Smoking status: Former Smoker    Types: Cigarettes  . Smokeless tobacco: Never Used  Substance and Sexual Activity  . Alcohol use: No  . Drug use: No  . Sexual activity: Not Currently  Lifestyle  . Physical activity:    Days per week: Not on file    Minutes per session: Not on file  . Stress: Not on file  Relationships  . Social connections:    Talks on phone: Not on file    Gets together: Not on file    Attends religious service: Not on file    Active member of club or organization: Not on file    Attends meetings of clubs or organizations: Not on file    Relationship status: Not on file  . Intimate partner violence:    Fear of current or ex partner: Not on file    Emotionally abused:  Not on file    Physically abused: Not on file    Forced sexual activity: Not on file  Other Topics Concern  . Not on file  Social History Narrative  . Not on file    Family History  Problem Relation Age of Onset  . Diabetes Mother   . Heart failure Mother   . Hypertension Mother   .  Diabetes Father   . Heart failure Father   . Hypertension Father   . Diabetes Sister   . Heart failure Sister   . Hypertension Sister   . Hyperlipidemia Sister   . Diabetes Brother   . Heart failure Brother   . Hypertension Brother      Review of Systems - {ros [id number]}   OBJECTIVE:  There were no vitals filed for this visit.   Physical Exam     ASSESSMENT AND PLAN:  77 y.o. year old male with   Problem List Items Addressed This Visit    None       No diagnosis found.   Signed,  Delsa Grana MPAS, PA-C  06/13/2018 1:58 PM

## 2018-06-13 NOTE — Patient Instructions (Addendum)
Get X-ray, I'll call with urine results  Take bentyl as needed for cramping, can try Gas-X or other gas medicine  If it gets worse please let us know right away we will want to get other tests or CT's done     Abdominal Pain, Adult Many things can cause belly (abdominal) pain. Most times, belly pain is not dangerous. Many cases of belly pain can be watched and treated at home. Sometimes belly pain is serious, though. Your doctor will try to find the cause of your belly pain. Follow these instructions at home:  Take over-the-counter and prescription medicines only as told by your doctor. Do not take medicines that help you poop (laxatives) unless told to by your doctor.  Drink enough fluid to keep your pee (urine) clear or pale yellow.  Watch your belly pain for any changes.  Keep all follow-up visits as told by your doctor. This is important. Contact a doctor if:  Your belly pain changes or gets worse.  You are not hungry, or you lose weight without trying.  You are having trouble pooping (constipated) or have watery poop (diarrhea) for more than 2-3 days.  You have pain when you pee or poop.  Your belly pain wakes you up at night.  Your pain gets worse with meals, after eating, or with certain foods.  You are throwing up and cannot keep anything down.  You have a fever. Get help right away if:  Your pain does not go away as soon as your doctor says it should.  You cannot stop throwing up.  Your pain is only in areas of your belly, such as the right side or the left lower part of the belly.  You have bloody or black poop, or poop that looks like tar.  You have very bad pain, cramping, or bloating in your belly.  You have signs of not having enough fluid or water in your body (dehydration), such as: ? Dark pee, very little pee, or no pee. ? Cracked lips. ? Dry mouth. ? Sunken eyes. ? Sleepiness. ? Weakness. This information is not intended to replace advice  given to you by your health care provider. Make sure you discuss any questions you have with your health care provider. Document Released: 04/05/2008 Document Revised: 05/07/2016 Document Reviewed: 03/31/2016 Elsevier Interactive Patient Education  2018 [employer].

## 2018-06-15 NOTE — Progress Notes (Signed)
Radiology report reviewed, images personally reviewed.  Agree with radiology report.  Pt will be called with negative imaging results.

## 2018-06-16 NOTE — Progress Notes (Signed)
Patient ID: [name], male    DOB: 10/17/1941, 77 y.o.   MRN: [id number]  PCP: Alycia Rossetti, MD  Chief Complaint  Patient presents with  . Hospitalization Follow-up    Patient also has c/o of right lower abdominal pain. Patient describes as gas pain    Subjective:   HPI: [name] is a 77 y.o. year old male  presents with hospital follow up after recent admission for diarrhea and CP with negative r/o for ACS, is following up with cardiology, here to follow up on abdominal pain.  Patient reports abdominal pain associated with his frequent bowel movements which was part of why he went into the ER on 06/06/18.  After being briefly mentioned one time in one of the notes from his hospital admission, he is only noted to have normal abdominal exams and no complaints of abdominal pain or diarrhea.  Today he and his wife report vague intermittent mild to moderate abd pain occurring in various locations briefly that feel like gas.  His pain was severe at the time of his hospitalization and has gradually improved.  Patient states that he tried Mylanta and Alka-Seltzer and it did not help much but today he does feel much better.  His BM's have returned to normal.  He denies cramping, N, V.  No sharp pains, no radiation of pains to chest or back.  No urinary sx.  No melena, no hematochezia.  He denies heart burn, dyspepsia, bloating.  No CP, SOB, palpitations.    He had AKI which resolved with fluids, negative cardiac work-up, negative chest x-ray Review of hospital records shows echo was significant for severe LVH with preserved systolic function LVEF 01-09%.  AKI with CKD stage 3, Cr returned to baseline 1.29.     Patient Active Problem List   Diagnosis Date Noted  . Chest pain 06/07/2018  . Hypoxia   . Sepsis ([place]) 02/24/2018  . Lactic acidosis 02/24/2018  . Acute-on-chronic kidney injury ([place]) 02/24/2018  . Dyspnea 02/20/2018  . BPH (benign prostatic hyperplasia) 02/20/2018  . DDD  (degenerative disc disease), lumbar 10/17/2017  . Chronic diastolic heart failure ([place]) 10/20/2015  . Peripheral edema 01/22/2015  . Tinea pedis 10/21/2014  . 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 Chronic kidney disease 11/21/2013  . Left leg weakness 09/06/2013  . CVA (cerebral infarction) 09/01/2013  . Left-sided weakness 08/22/2013  . OSA (obstructive sleep apnea) 04/12/2013  . DM (diabetes mellitus) type II controlled with renal manifestation ([place]) 11/28/2012  . PVD (peripheral vascular disease) ([place]) 11/28/2012  . CAD (coronary artery disease) 11/28/2012  . Hyperlipidemia 11/28/2012  . Obesity, Class III, BMI 40-49.9 (morbid obesity) ([place]) 11/28/2012  . History of stroke 11/28/2012  . OAB (overactive bladder) 11/28/2012  . HTN (hypertension) 05/20/2011     Prior to Admission medications   Medication Sig Start Date End Date Taking? Authorizing Provider  acetaminophen (TYLENOL) 650 MG CR tablet Take 1,300 mg by mouth every 8 (eight) hours as needed for pain.   Yes [provider]  Adalimumab 80 MG/0.8ML & 40MG /0.4ML PNKT Inject 1 Dose into the skin every 14 (fourteen) days. 09/21/17  Yes [provider]  atorvastatin (LIPITOR) 80 MG tablet Take 40 mg by mouth every evening.    Yes [provider]  Brinzolamide-Brimonidine (SIMBRINZA) 1-0.2 % SUSP Place 1 drop into the right eye 2 (two) times daily.    Yes [provider]  carboxymethylcellulose (REFRESH PLUS)  0.5 % SOLN Place 1 drop into both eyes 4 (four) times daily as needed.   Yes [provider]  clotrimazole-betamethasone (LOTRISONE) cream Apply 1 application topically 2 (two) times daily. Patient taking differently: Apply 1 application topically as needed.  12/02/17  Yes Wainiha, Modena Nunnery, MD  dipyridamole-aspirin (AGGRENOX) 200-25 MG per 12 hr capsule Take 1 capsule by mouth 2 (two) times daily.   Yes [provider]  folic acid (FOLVITE)  1 MG tablet Take 1 mg by mouth daily.  03/14/17  Yes [provider]  furosemide (LASIX) 20 MG tablet Take 1 tablet (20 mg total) by mouth daily. 02/19/18  Yes Hayden Rasmussen, MD  insulin aspart protamine- aspart (NOVOLOG MIX 70/30) (70-30) 100 UNIT/ML injection Inject 40-50 Units into the skin See admin instructions. Patient takes 40 units in the morning and 50 units at night.    Yes [provider]  Ipratropium-Albuterol (COMBIVENT RESPIMAT) 20-100 MCG/ACT AERS respimat Inhale 1 puff into the lungs every 6 (six) hours as needed for wheezing or shortness of breath.    Yes [provider]  ketorolac (ACULAR) 0.5 % ophthalmic solution Place 2-4 drops into the right eye See admin instructions. 1 drop in the Right eye twice daily and one drop in the left eye 4 times daily 03/10/17  Yes [provider]  losartan (COZAAR) 100 MG tablet Take 100 mg by mouth daily.     Yes [provider]  metFORMIN (GLUCOPHAGE) 1000 MG tablet Take 1,000 mg by mouth 2 (two) times daily with a meal.     Yes [provider]  methotrexate (RHEUMATREX) 2.5 MG tablet Take 20 mg by mouth once a week. 06/23/17  Yes [provider]  metoprolol (LOPRESSOR) 50 MG tablet Take 0.5 tablets (25 mg total) by mouth 2 (two) times daily. FOR HEART/BLOOD PRESSURE. HOLD FOR SYSTOLIC BLOOD PRESSURE LESS THAN 1OO/HEART RATE LESS THAN 60 05/20/11  Yes Annita Brod, MD  potassium chloride (K-DUR,KLOR-CON) 10 MEQ tablet TAKE 2 TABLETS BY MOUTH ONCE DAILY -  TAKE  WITH  LASIX  (FUROSEMIDE) 05/15/18  Yes Ballard, Modena Nunnery, MD  prednisoLONE acetate (PRED FORTE) 1 % ophthalmic suspension INSTILL 1 DROP INTO LEFT EYE THREE TIMES DAILY AND TWICE DAILY IN THE RIGHT EYE 05/14/18  Yes [provider]  spironolactone (ALDACTONE) 25 MG tablet Take 25 mg by mouth daily.   Yes [provider]  tamsulosin (FLOMAX) 0.4 MG CAPS capsule Take 2 capsules (0.8 mg total) by mouth every  evening. 06/09/16  Yes , Modena Nunnery, MD  dicyclomine (BENTYL) 20 MG tablet Take 0.5 tablets (10 mg total) by mouth 3 (three) times daily with meals as needed for spasms. 06/13/18   Delsa Grana, PA-C  pantoprazole (PROTONIX) 20 MG tablet Take 1 tablet (20 mg total) by mouth daily. 06/13/18   Delsa Grana, PA-C     Allergies  Allergen Reactions  . Ivp Dye [Iodinated Diagnostic Agents] Rash     Family History  Problem Relation Age of Onset  . Diabetes Mother   . Heart failure Mother   . Hypertension Mother   . Diabetes Father   . Heart failure Father   . Hypertension Father   . Diabetes Sister   . Heart failure Sister   . Hypertension Sister   . Hyperlipidemia Sister   . Diabetes Brother   . Heart failure Brother   . Hypertension Brother      Social History   Socioeconomic History  .  Marital status: Married    Spouse name: Not on file  . Number of children: Not on file  . Years of education: Not on file  . Highest education level: Not on file  Occupational History  . Not on file  Social Needs  . Financial resource strain: Not on file  . Food insecurity:    Worry: Not on file    Inability: Not on file  . Transportation needs:    Medical: Not on file    Non-medical: Not on file  Tobacco Use  . Smoking status: Former Smoker    Types: Cigarettes  . Smokeless tobacco: Never Used  Substance and Sexual Activity  . Alcohol use: No  . Drug use: No  . Sexual activity: Not Currently  Lifestyle  . Physical activity:    Days per week: Not on file    Minutes per session: Not on file  . Stress: Not on file  Relationships  . Social connections:    Talks on phone: Not on file    Gets together: Not on file    Attends religious service: Not on file    Active member of club or organization: Not on file    Attends meetings of clubs or organizations: Not on file    Relationship status: Not on file  . Intimate partner violence:    Fear of current or ex partner: Not on  file    Emotionally abused: Not on file    Physically abused: Not on file    Forced sexual activity: Not on file  Other Topics Concern  . Not on file  Social History Narrative  . Not on file     Review of Systems  Constitutional: Negative.   HENT: Negative.   Eyes: Negative.   Respiratory: Negative.   Cardiovascular: Negative.   Gastrointestinal: Negative.   Endocrine: Negative.   Genitourinary: Negative.   Musculoskeletal: Negative.   Skin: Negative.   Allergic/Immunologic: Negative.   Neurological: Negative.   Hematological: Negative.   Psychiatric/Behavioral: Negative.   All other systems reviewed and are negative.      Objective:    Vitals:   06/13/18 1410  BP: (!) 122/56  Pulse: 63  Resp: 16  Temp: 98.6 F (37 C)  TempSrc: Oral  SpO2: 98%  Weight: 277 lb 2 oz (125.7 kg)      Physical Exam  Constitutional: He appears well-developed and well-nourished. No distress.  Elderly obese male, chronically ill, but well appearing, non-toxic, alert, NAD  Skin: He is not diaphoretic.  Nursing note and vitals reviewed.         Assessment & Plan:      ICD-10-CM   1. Intermittent right lower quadrant abdominal pain R10.31 Urinalysis, Routine w reflex microscopic    DG Abd 1 View  2. Encounter for examination following treatment at hospital Z09     Pt with hospital follow up for CP. Sx started with some diarrhea as well, which has resolved, he is here today to check intermittent abdominal that is improving, and no pain today.  Pain described as gas, no ttp on exam, obese abd/body habitus limits exam somewhat.  No other concerning sx.  May be mild pain related to some resolving colitis.  He has had significant improvements we do not feel that CT scan is currently indicated, but will obtain KUB to evaluate gas and bowel patterns.  He can only describe the pain as gas pains he is not otherwise distended and he is  having normal bowel movements and passing gas.  Will  also do a trial of PPI, and Bentyl as needed for any cramping or gassy pains.  He was also advised to try over-the-counter Gas-X, avoid spicy or greasy foods.  Limit caffeine and alcohol intake although he states that he does not drink.    He was instructed to follow-up immediately if he has any worsening of his pain so he be reevaluated at that time.  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  Kidney function returned to baseline.  He otherwise appears well without any other concerns or complaints.   Delsa Grana, PA-C 06/16/18 1:27 PM

## 2018-07-05 ENCOUNTER — Encounter: Payer: Self-pay | Admitting: Physician Assistant

## 2018-07-05 NOTE — Progress Notes (Signed)
Cardiology Office Note    Date:  07/07/2018  ID:  [name], DOB 06-10-1941, MRN [id number] PCP:  Alycia Rossetti, MD  Cardiologist:  Rozann Lesches, MD  Chief Complaint: f/u chest pain  History of Present Illness:  [name] is a 77 y.o. male with history of CAD (s/p CABG in 2007), chronic diastolic CHF, hypertensive heart disease/LVH, HTN, HLD, Type 2 DM, OSA (on CPAP), BPH, prior CVA, chronic appearing anemia, probable CKD stage III, PVD, morbid obesity who presents for post-hospital follow-up.   His cardiac history is complex as he reports he reports did not have any anginal symptoms at the time of bypass in 2007. He had undergone stress testing for preoperative clearance prior to a cholecystectomy and was told that his stress test looked normal, but due to his significant family history of CAD a cardiac catheterization was pursued which showed three-vessel CAD. CABG was at the [facility] in [place]. He was last evaluated by Dr. Domenic Polite in 07/2017 and reported having baseline dyspnea on exertion but denied any recent chest discomfort. A nuclear stress test was recommended for ischemic evaluation at that time and showed findings consistent with a small mild prior apical MI and mild peri-infarct ischemia but it was overall a low risk study and continued medical management was recommended. More recently he was seen in the hopsital early August with chest discomfort and nausea. He ruled out for MI but EKG showed NSR, HR 65, with 1st degree AV block and TWI along the inferior leads which is new since prior tracings in 01/2018. Cr was 1.66 so losartan was held. This was resumed at DC. 2D echo 06/07/18 showed severe LVH, EF 55-60%, technically difficult study. Regarding PVD, this is mentioned in DC summary but no studies listed in chart (normal carotids 2014) - Aggrenox is not prescribed through our office. Patient denies this. Last BMET showed Cr 1.29 (8/8), K 4.4, Hgb 10.8 (similar to prior), plt 231.  Last LDL 2017 was 74. He reports lipids are followed now by [facility].  He returns for f/u with his wife. He has been feeling well since DC without CP or SOB. He ambulates slowly with a rolling walker due to prior orthopedic issues. He takes a daily walk down the street. BP at recent outpatient visit otherwise was well controlled. Does not add salt but does not watch salt in diet either. He reports LLE is chronically larger than RLE ever since bypass, without acute change recently.    Past Medical History:  Diagnosis Date  . Anemia   . C. difficile diarrhea   . CKD (chronic kidney disease), stage III ([place])   . Coronary artery disease    Status post CABG in 2007 [facility] [place])  . Diabetes mellitus with stage 1 chronic kidney disease ([place]) 05/20/2011  . GERD (gastroesophageal reflux disease)   . Glaucoma   . Hyperlipidemia   . Hypertension   . Morbid obesity ([place])   . Peripheral vascular disease ([place])   . Sleep apnea   . Stroke [facility]) 2008, 2014, 2015    Past Surgical History:  Procedure Laterality Date  . CHOLECYSTECTOMY    . CORONARY ARTERY BYPASS GRAFT    . NO PAST SURGERIES    . THYROID SURGERY      Current Medications: Current Meds  Medication Sig  . acetaminophen (TYLENOL) 650 MG CR tablet Take 1,300 mg by mouth every 8 (eight) hours as needed for pain.  . Adalimumab 80  MG/0.8ML & 40MG /0.4ML PNKT Inject 1 Dose into the skin every 14 (fourteen) days.  [name] atorvastatin (LIPITOR) 80 MG tablet Take 40 mg by mouth every evening.   . Brinzolamide-Brimonidine (SIMBRINZA) 1-0.2 % SUSP Place 1 drop into the right eye 2 (two) times daily.   . carboxymethylcellulose (REFRESH PLUS) 0.5 % SOLN Place 1 drop into both eyes 4 (four) times daily as needed.  . clotrimazole-betamethasone (LOTRISONE) cream Apply 1 application topically 2 (two) times daily. (Patient taking differently: Apply 1 application topically as needed. )  . dicyclomine (BENTYL) 20 MG tablet Take 0.5 tablets (10  mg total) by mouth 3 (three) times daily with meals as needed for spasms.  [name] dipyridamole-aspirin (AGGRENOX) 200-25 MG per 12 hr capsule Take 1 capsule by mouth 2 (two) times daily.  . folic acid (FOLVITE) 1 MG tablet Take 1 mg by mouth daily.   . furosemide (LASIX) 20 MG tablet Take 1 tablet (20 mg total) by mouth daily.  . insulin aspart protamine- aspart (NOVOLOG MIX 70/30) (70-30) 100 UNIT/ML injection Inject 40-50 Units into the skin See admin instructions. Patient takes 40 units in the morning and 50 units at night.   . Ipratropium-Albuterol (COMBIVENT RESPIMAT) 20-100 MCG/ACT AERS respimat Inhale 1 puff into the lungs every 6 (six) hours as needed for wheezing or shortness of breath.   [name] ketorolac (ACULAR) 0.5 % ophthalmic solution Place 2-4 drops into the right eye See admin instructions. 1 drop in the Right eye twice daily and one drop in the left eye 4 times daily  . losartan (COZAAR) 100 MG tablet Take 100 mg by mouth daily.    . metFORMIN (GLUCOPHAGE) 1000 MG tablet Take 1,000 mg by mouth 2 (two) times daily with a meal.    . methotrexate (RHEUMATREX) 2.5 MG tablet Take 20 mg by mouth once a week.  . metoprolol (LOPRESSOR) 50 MG tablet Take 0.5 tablets (25 mg total) by mouth 2 (two) times daily. FOR HEART/BLOOD PRESSURE. HOLD FOR SYSTOLIC BLOOD PRESSURE LESS THAN 1OO/HEART RATE LESS THAN 60  . pantoprazole (PROTONIX) 20 MG tablet Take 1 tablet (20 mg total) by mouth daily.  . potassium chloride (K-DUR,KLOR-CON) 10 MEQ tablet TAKE 2 TABLETS BY MOUTH ONCE DAILY -  TAKE  WITH  LASIX  (FUROSEMIDE)  . prednisoLONE acetate (PRED FORTE) 1 % ophthalmic suspension INSTILL 1 DROP INTO LEFT EYE THREE TIMES DAILY AND TWICE DAILY IN THE RIGHT EYE  . spironolactone (ALDACTONE) 25 MG tablet Take 25 mg by mouth daily.  . tamsulosin (FLOMAX) 0.4 MG CAPS capsule Take 2 capsules (0.8 mg total) by mouth every evening.      Allergies:   Ivp dye [iodinated diagnostic agents]   Social History    Socioeconomic History  . Marital status: Married    Spouse name: Not on file  . Number of children: Not on file  . Years of education: Not on file  . Highest education level: Not on file  Occupational History  . Not on file  Social Needs  . Financial resource strain: Not on file  . Food insecurity:    Worry: Not on file    Inability: Not on file  . Transportation needs:    Medical: Not on file    Non-medical: Not on file  Tobacco Use  . Smoking status: Former Smoker    Types: Cigarettes  . Smokeless tobacco: Never Used  Substance and Sexual Activity  . Alcohol use: No  . Drug use: No  . Sexual  activity: Not Currently  Lifestyle  . Physical activity:    Days per week: Not on file    Minutes per session: Not on file  . Stress: Not on file  Relationships  . Social connections:    Talks on phone: Not on file    Gets together: Not on file    Attends religious service: Not on file    Active member of club or organization: Not on file    Attends meetings of clubs or organizations: Not on file    Relationship status: Not on file  Other Topics Concern  . Not on file  Social History Narrative  . Not on file     Family History:  The patient's family history includes Diabetes in his brother, father, mother, and sister; Heart failure in his brother, father, mother, and sister; Hyperlipidemia in his sister; Hypertension in his brother, father, mother, and sister.  ROS:   Please see the history of present illness. .  All other systems are reviewed and otherwise negative.    PHYSICAL EXAM:   VS:  BP 138/60   Pulse 67   Ht 5\' 8"  (1.727 m)   Wt 282 lb (127.9 kg)   SpO2 98%   BMI 42.88 kg/m   BMI: Body mass index is 42.88 kg/m. GEN: Well nourished, well developed morbidly obese AAM, in no acute distress HEENT: normocephalic, atraumatic Neck: no JVD, carotid bruits, or masses Cardiac: RRR; no murmurs, rubs, or gallops, 1+ LLE edema which pt states is chronic since CABG,  no change Respiratory:  clear to auscultation bilaterally, normal work of breathing GI: soft, nontender, nondistended, + BS MS: no deformity or atrophy Skin: warm and dry, no rash Neuro:  Alert and Oriented x 3, Strength and sensation are intact, follows commands Psych: euthymic mood, full affect  Wt Readings from Last 3 Encounters:  07/07/18 282 lb (127.9 kg)  06/13/18 277 lb 2 oz (125.7 kg)  06/07/18 272 lb 7.8 oz (123.6 kg)      Studies/Labs Reviewed:   EKG:   EKG was not ordered today  Recent Labs: 02/24/2018: ALT 22 02/25/2018: TSH 1.039 06/06/2018: B Natriuretic Peptide 40.0; Hemoglobin 10.8; Magnesium 2.0; Platelets 231 06/08/2018: BUN 20; Creatinine, Ser 1.29; Potassium 4.4; Sodium 138   Lipid Panel    Component Value Date/Time   CHOL 115 10/17/2017 1151   TRIG 98 10/17/2017 1151   HDL 51 10/17/2017 1151   CHOLHDL 2.3 10/17/2017 1151   VLDL 18 10/11/2016 1042   LDLCALC 46 10/17/2017 1151    Additional studies/ records that were reviewed today include: Summarized above  ASSESSMENT & PLAN:   1. Chest pain - r/o for MI, no recurrence. Continue surveillance for recurrent sx. Discussed early notification to our office of new symptoms. 2. CAD - continue medical therapy. He is on Aggrenox for stroke hx. 3. CKD stage III - he reports this was rechecked after DC at the [facility]. 4. HTN - BP mildly above goal but recently controlled during hospital stay and at PCP visit. Continue to monitor. Discussed healthier diet change with low sodium. 5. [facility] 6. Chronic diastolic CHF - he reports he feels at baseline. Weight has been variable at times, suspect dietary related. It was 298 in 07/2017, 293 in 10/2017, 277lb at PCP visit, and 282lb today with shoes, suspenders, clothes on. Would continue present regimen for now and advise lifestyle modification as above.  Disposition: F/u with Dr. Domenic Polite in 6 months.  Medication Adjustments/Labs and Tests  Ordered: Current medicines are reviewed at length with the patient today.  Concerns regarding medicines are outlined above. Medication changes, Labs and Tests ordered today are summarized above and listed in the Patient Instructions accessible in Encounters.   Signed, Charlie Pitter, PA-C  07/07/2018 1:56 PM    [facility] in [place]. [address], [place] [address] Ph: [phone]; Fax [phone]

## 2018-07-07 ENCOUNTER — Ambulatory Visit: Payer: Medicare HMO | Admitting: Physician Assistant

## 2018-07-07 ENCOUNTER — Encounter: Payer: Self-pay | Admitting: Physician Assistant

## 2018-07-07 VITALS — BP 138/60 | HR 67 | Ht 68.0 in | Wt 282.0 lb

## 2018-07-07 DIAGNOSIS — I2510 Atherosclerotic heart disease of native coronary artery without angina pectoris: Secondary | ICD-10-CM | POA: Diagnosis not present

## 2018-07-07 DIAGNOSIS — I5032 Chronic diastolic (congestive) heart failure: Secondary | ICD-10-CM

## 2018-07-07 DIAGNOSIS — R079 Chest pain, unspecified: Secondary | ICD-10-CM

## 2018-07-07 DIAGNOSIS — N183 Chronic kidney disease, stage 3 unspecified: Secondary | ICD-10-CM

## 2018-07-07 DIAGNOSIS — I10 Essential (primary) hypertension: Secondary | ICD-10-CM

## 2018-07-07 DIAGNOSIS — E785 Hyperlipidemia, unspecified: Secondary | ICD-10-CM

## 2018-07-07 NOTE — Patient Instructions (Signed)
Medication Instructions:  Your physician recommends that you continue on your current medications as directed. Please refer to the Current Medication list given to you today.   Labwork: NONE   Testing/Procedures: NONE   Follow-Up: Your physician wants you to follow-up in: 6 Months. You will receive a reminder letter in the mail two months in advance. If you don't receive a letter, please call our office to schedule the follow-up appointment.   Any Other Special Instructions Will Be Listed Below (If Applicable).     If you need a refill on your cardiac medications before your next appointment, please call your pharmacy.  Thank you for choosing [facility]!   

## 2018-08-10 ENCOUNTER — Encounter: Payer: Self-pay | Admitting: Family Medicine

## 2018-08-10 ENCOUNTER — Ambulatory Visit (INDEPENDENT_AMBULATORY_CARE_PROVIDER_SITE_OTHER): Payer: Medicare HMO | Admitting: Family Medicine

## 2018-08-10 ENCOUNTER — Other Ambulatory Visit: Payer: Self-pay

## 2018-08-10 VITALS — BP 138/70 | HR 68 | Temp 98.1°F | Resp 16 | Ht 68.0 in | Wt 292.0 lb

## 2018-08-10 DIAGNOSIS — R600 Localized edema: Secondary | ICD-10-CM

## 2018-08-10 DIAGNOSIS — E1122 Type 2 diabetes mellitus with diabetic chronic kidney disease: Secondary | ICD-10-CM

## 2018-08-10 DIAGNOSIS — I5032 Chronic diastolic (congestive) heart failure: Secondary | ICD-10-CM | POA: Diagnosis not present

## 2018-08-10 DIAGNOSIS — Z794 Long term (current) use of insulin: Secondary | ICD-10-CM | POA: Diagnosis not present

## 2018-08-10 DIAGNOSIS — R0609 Other forms of dyspnea: Secondary | ICD-10-CM

## 2018-08-10 DIAGNOSIS — N183 Chronic kidney disease, stage 3 unspecified: Secondary | ICD-10-CM

## 2018-08-10 DIAGNOSIS — Z23 Encounter for immunization: Secondary | ICD-10-CM | POA: Diagnosis not present

## 2018-08-10 DIAGNOSIS — R0600 Dyspnea, unspecified: Secondary | ICD-10-CM

## 2018-08-10 NOTE — Progress Notes (Signed)
Patient ID: [name], male    DOB: Jan 25, 1941, 77 y.o.   MRN: [id number]  PCP: Alycia Rossetti, MD  Chief Complaint  Patient presents with  . Edema    x1 week- weight gain, swelling to legs, SOB    Subjective:   [name] is a 77 y.o. male, presents to clinic with CC of one week of leg swelling and exertional dyspnea with pertinent MHx of CHF. The patient and his wife note that after hospitalization earlier this year he had gotten it "where he wasn't short of breath at all".  Now he is currently short of breath with walking short distances, such as around the house and from the parking lot into the clinic.  He has worse swelling to his lower extremities and his weight has increased but patient does not know his dry weight. No CP, chest tightness  No orthopnea, no PND Coughing reported by wife, not productive, no fever, sweats, chills.   She reports several weeks of pt eating large portions or several serving of unhealthy food - like "four plates of chinese food"  She has encouraged him to eat healthy and avoid salt but she tearfully states "he is going to kill himself"   They do not know the lasix dose that he is currently on "whatever Dr. Buelah Manis prescribed"  In chart there is 20 mg lasix from April with only 30 pills and no refills.  [facility], and there is also 25 mg adelactone.  They report that had a recent cardiology follow-up appointment, 07/07/2018, he was rechecked and told to continue his current management, but again the patient does not actually know what the current dose is and they do not have the medication with them.  Her cardiology note chronic diastolic CHF 1 month ago he reported to feel at baseline with notation of varying weights from 277-298.  Does weights at home, lowest was 265 when he came out of the hospital, but that was several months ago, April 2019, nearly 6 months ago.  Patient Active Problem List   Diagnosis Date Noted  . Chest pain  06/07/2018  . Hypoxia   . Sepsis ([place]) 02/24/2018  . Lactic acidosis 02/24/2018  . Acute-on-chronic kidney injury ([place]) 02/24/2018  . Dyspnea 02/20/2018  . BPH (benign prostatic hyperplasia) 02/20/2018  . DDD (degenerative disc disease), lumbar 10/17/2017  . Chronic diastolic heart failure ([place]) 10/20/2015  . Peripheral edema 01/22/2015  . Tinea pedis 10/21/2014  . 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 Chronic kidney disease 11/21/2013  . Left leg weakness 09/06/2013  . CVA (cerebral infarction) 09/01/2013  . Left-sided weakness 08/22/2013  . OSA (obstructive sleep apnea) 04/12/2013  . DM (diabetes mellitus) type II controlled with renal manifestation ([name]) 11/28/2012  . PVD (peripheral vascular disease) ([place]) 11/28/2012  . CAD (coronary artery disease) 11/28/2012  . Hyperlipidemia 11/28/2012  . Obesity, Class III, BMI 40-49.9 (morbid obesity) ([name]) 11/28/2012  . History of stroke 11/28/2012  . OAB (overactive bladder) 11/28/2012  . HTN (hypertension) 05/20/2011     Prior to Admission medications   Medication Sig Start Date End Date Taking? Authorizing Provider  acetaminophen (TYLENOL) 650 MG CR tablet Take 1,300 mg by mouth every 8 (eight) hours as needed for pain.   Yes [provider]  Adalimumab 80 MG/0.8ML & 40MG /0.4ML PNKT Inject 1 Dose into the skin every 14 (fourteen) days. 09/21/17  Yes [provider]  atorvastatin (LIPITOR) 80 MG tablet Take 40 mg by mouth every evening.    Yes [provider]  Brinzolamide-Brimonidine (SIMBRINZA) 1-0.2 % SUSP Place 1 drop into the right eye 2 (two) times daily.    Yes [provider]  carboxymethylcellulose (REFRESH PLUS) 0.5 % SOLN Place 1 drop into both eyes 4 (four) times daily as needed.   Yes [provider]  clotrimazole-betamethasone (LOTRISONE) cream Apply 1 application topically 2 (two) times daily. Patient taking differently: Apply 1 application  topically as needed.  12/02/17  Yes [place], Modena Nunnery, MD  dicyclomine (BENTYL) 20 MG tablet Take 0.5 tablets (10 mg total) by mouth 3 (three) times daily with meals as needed for spasms. 06/13/18  Yes Delsa Grana, PA-C  dipyridamole-aspirin (AGGRENOX) 200-25 MG per 12 hr capsule Take 1 capsule by mouth 2 (two) times daily.   Yes [provider]  folic acid (FOLVITE) 1 MG tablet Take 1 mg by mouth daily.  03/14/17  Yes [provider]  furosemide (LASIX) 20 MG tablet Take 1 tablet (20 mg total) by mouth daily. 02/19/18  Yes Hayden Rasmussen, MD  insulin aspart protamine- aspart (NOVOLOG MIX 70/30) (70-30) 100 UNIT/ML injection Inject 40-50 Units into the skin See admin instructions. Patient takes 40 units in the morning and 50 units at night.    Yes [provider]  Ipratropium-Albuterol (COMBIVENT RESPIMAT) 20-100 MCG/ACT AERS respimat Inhale 1 puff into the lungs every 6 (six) hours as needed for wheezing or shortness of breath.    Yes [provider]  ketorolac (ACULAR) 0.5 % ophthalmic solution Place 2-4 drops into the right eye See admin instructions. 1 drop in the Right eye twice daily and one drop in the left eye 4 times daily 03/10/17  Yes [provider]  losartan (COZAAR) 100 MG tablet Take 100 mg by mouth daily.     Yes [provider]  metFORMIN (GLUCOPHAGE) 1000 MG tablet Take 1,000 mg by mouth 2 (two) times daily with a meal.     Yes [provider]  methotrexate (RHEUMATREX) 2.5 MG tablet Take 20 mg by mouth once a week. 06/23/17  Yes [provider]  metoprolol (LOPRESSOR) 50 MG tablet Take 0.5 tablets (25 mg total) by mouth 2 (two) times daily. FOR HEART/BLOOD PRESSURE. HOLD FOR SYSTOLIC BLOOD PRESSURE LESS THAN 1OO/HEART RATE LESS THAN 60 05/20/11  Yes Annita Brod, MD  pantoprazole (PROTONIX) 20 MG tablet Take 1 tablet (20 mg total) by mouth daily. 06/13/18  Yes Delsa Grana, PA-C  potassium chloride  (K-DUR,KLOR-CON) 10 MEQ tablet TAKE 2 TABLETS BY MOUTH ONCE DAILY -  TAKE  WITH  LASIX  (FUROSEMIDE) 05/15/18  Yes [name], Modena Nunnery, MD  prednisoLONE acetate (PRED FORTE) 1 % ophthalmic suspension INSTILL 1 DROP INTO LEFT EYE THREE TIMES DAILY AND TWICE DAILY IN THE RIGHT EYE 05/14/18  Yes [provider]  spironolactone (ALDACTONE) 25 MG tablet Take 25 mg by mouth daily.   Yes [provider]  tamsulosin (FLOMAX) 0.4 MG CAPS capsule Take 2 capsules (0.8 mg total) by mouth every evening. 06/09/16  Yes , Modena Nunnery, MD     Allergies  Allergen Reactions  . Ivp Dye [Iodinated Diagnostic Agents] Rash     Family History  Problem Relation Age of Onset  . Diabetes Mother   . Heart failure Mother   . Hypertension Mother   . Diabetes Father   . Heart failure Father   . Hypertension Father   .  Diabetes Sister   . Heart failure Sister   . Hypertension Sister   . Hyperlipidemia Sister   . Diabetes Brother   . Heart failure Brother   . Hypertension Brother      Social History   Socioeconomic History  . Marital status: Married    Spouse name: Not on file  . Number of children: Not on file  . Years of education: Not on file  . Highest education level: Not on file  Occupational History  . Not on file  Social Needs  . Financial resource strain: Not on file  . Food insecurity:    Worry: Not on file    Inability: Not on file  . Transportation needs:    Medical: Not on file    Non-medical: Not on file  Tobacco Use  . Smoking status: Former Smoker    Types: Cigarettes  . Smokeless tobacco: Never Used  Substance and Sexual Activity  . Alcohol use: No  . Drug use: No  . Sexual activity: Not Currently  Lifestyle  . Physical activity:    Days per week: Not on file    Minutes per session: Not on file  . Stress: Not on file  Relationships  . Social connections:    Talks on phone: Not on file    Gets together: Not on file    Attends religious service: Not on  file    Active member of club or organization: Not on file    Attends meetings of clubs or organizations: Not on file    Relationship status: Not on file  . Intimate partner violence:    Fear of current or ex partner: Not on file    Emotionally abused: Not on file    Physically abused: Not on file    Forced sexual activity: Not on file  Other Topics Concern  . Not on file  Social History Narrative  . Not on file     Review of Systems  Constitutional: Negative.  Negative for activity change, appetite change, chills, diaphoresis, fatigue and fever.  HENT: Negative.   Eyes: Negative.   Respiratory: Positive for cough and shortness of breath. Negative for apnea, choking, chest tightness, wheezing and stridor.   Cardiovascular: Positive for leg swelling. Negative for chest pain.  Gastrointestinal: Negative.   Endocrine: Negative.   Musculoskeletal: Negative.   Skin: Negative.   Allergic/Immunologic: Negative.   Neurological: Negative.  Negative for dizziness, tremors, syncope, weakness, light-headedness and numbness.  Hematological: Negative.   Psychiatric/Behavioral: Negative.        Objective:    Vitals:   08/10/18 0915  BP: 138/70  Pulse: 68  Resp: 16  Temp: 98.1 F (36.7 C)  TempSrc: Oral  SpO2: 100%  Weight: 292 lb (132.5 kg)  Height: 5\' 8"  (1.727 m)      Physical Exam  Constitutional: He appears well-developed and well-nourished. No distress.  Elderly morbidly obese male, no acute distress  HENT:  Head: Normocephalic and atraumatic.  Nose: Nose normal.  Mouth/Throat: Oropharynx is clear and moist. No oropharyngeal exudate.  Eyes: Conjunctivae are normal. Right eye exhibits no discharge. Left eye exhibits no discharge. No scleral icterus.  Neck: No tracheal deviation present.  Cardiovascular: Normal rate and regular rhythm. Exam reveals distant heart sounds. Exam reveals no gallop and no friction rub.  No murmur heard. Pulses:      Radial pulses are 2+ on  the right side, and 2+ on the left side.  2+ bilateral pretibial edema  Exam of patient, particularly auscultation, limited by body habitus and also by unfortunately construction occurring at the clinic with removal and replacement of the roof - limits auscultation of heart sounds  Pulmonary/Chest: Effort normal. No accessory muscle usage or stridor. No respiratory distress. He has no wheezes. He has no rhonchi. He has no rales.  Abdominal: Soft. Bowel sounds are normal. He exhibits no distension. There is no tenderness.  Musculoskeletal: He exhibits no deformity.  Neurological: He is alert. He exhibits normal muscle tone.  Skin: Skin is warm. Capillary refill takes less than 2 seconds. No rash noted. He is not diaphoretic. No pallor.  Psychiatric: He has a normal mood and affect. His behavior is normal.  Nursing note and vitals reviewed.    Patient ambulated around the clinic with pulse oximetry, reported shortness of breath after one lap but was able to complete 4 laps maintaining SpO2 > 90%     Assessment & Plan:      ICD-10-CM   1. Chronic diastolic heart failure (HCC) I50.32 Brain natriuretic peptide    COMPLETE METABOLIC PANEL WITH GFR    CBC with Differential    DG Chest 2 View  2. DOE (dyspnea on exertion) R06.09 Brain natriuretic peptide    COMPLETE METABOLIC PANEL WITH GFR    CBC with Differential    DG Chest 2 View  3. Bilateral lower extremity edema R60.0 Brain natriuretic peptide    COMPLETE METABOLIC PANEL WITH GFR    CBC with Differential  4. Encounter for immunization Z23 Flu vaccine HIGH DOSE PF    Patient is to call us with his current dosing of Lasix so I can increase it for the next 3 days.  He was instructed to decrease eating of high salt foods.  Also because of difficulty with his body habitus, and unfortunately loud construction occurring in clinic today, had difficult exam of heart and lungs today in clinic, CXR ordered to assist in evaluation, hope to r/o  infiltrate, assess any pulm edema, effusion etc.  Reassuring that there was no hypoxia, SpO2 94-98 and otherwise VSS. Pt exam c/w fluid overload with 2+ pitting edema.  I am unsure of his actual dry weight.   Wt Readings from Last 10 Encounters:  08/10/18 292 lb (132.5 kg)  07/07/18 282 lb (127.9 kg)  06/13/18 277 lb 2 oz (125.7 kg)  06/07/18 272 lb 7.8 oz (123.6 kg)  05/17/18 281 lb (127.5 kg)  03/13/18 269 lb (122 kg)  03/01/18 279 lb 12.2 oz (126.9 kg)  02/20/18 280 lb 6.8 oz (127.2 kg)  02/19/18 292 lb (132.5 kg)   With his trend of weights going back to April suspect he has some fluid overload although with the way that his wife is describing his eating habits he may have also just gained some weight, so we will check renal function, BNP, and once I am able to confirm the Lasix dosing that he takes daily I will increase it for several days and try to gently diuresis.  Depending on labs and patient's symptoms will adjust dose of duration of Lasix increased/and aggressiveness of diuresis.  Pts daughter called, Loma Sousa, she help manage meds, reports that the patient is taking 20 mg lasix a day, will increase to 40 mg for 3 days, recheck in clinic Monday.  Pt without distress, no CP, able to ambulate without any worrisome work of breathing and definitely no respiratory distress.  Feel he is safe to gently diurese over the weekend and to  be rechecked early next week.  Did discuss with the patient and with his wife at length dietary considerations, low-salt diet -handouts were also printed for the patient.  Did reiterate to the patient many times that it is his decision to follow dietary and medical guidelines to give him optimal quality and quantity of life.  He is free to choose contrary to medical recommendations but it does have consequences and we reviewed these again at length describing how increased salts poor diet or noncompliance with medicines can increase his blood volume and quickly  fluid overload him, overwhelming his heart, causing chest pain, difficulty breathing, multiple organ failure, morbidity and/or mortality.  He verbalizes understanding and does accept responsibility for his actions.  I did review with the patient and with his wife ER precautions and they verbalized understanding.  Delsa Grana, PA-C 08/10/18 9:29 AM

## 2018-08-10 NOTE — Patient Instructions (Signed)
Call me with the Lasix dose that you have at home so that we can double that for 3 days.  Need to get fluid off and recheck you early next week.     Need to be very careful with your diet, avoiding high salt and high fat foods.  Avoid processed foods, restaurant foods - most will have very high salt content.  See diet below

## 2018-08-11 ENCOUNTER — Ambulatory Visit: Payer: Medicare HMO | Admitting: Family Medicine

## 2018-08-11 LAB — BRAIN NATRIURETIC PEPTIDE: Brain Natriuretic Peptide: 167 pg/mL — ABNORMAL HIGH (ref <100)

## 2018-08-15 ENCOUNTER — Other Ambulatory Visit: Payer: Self-pay

## 2018-08-15 ENCOUNTER — Ambulatory Visit (INDEPENDENT_AMBULATORY_CARE_PROVIDER_SITE_OTHER): Payer: Medicare HMO | Admitting: Family Medicine

## 2018-08-15 ENCOUNTER — Encounter: Payer: Self-pay | Admitting: Family Medicine

## 2018-08-15 VITALS — BP 132/74 | HR 82 | Temp 98.6°F | Resp 16 | Ht 68.0 in | Wt 280.0 lb

## 2018-08-15 DIAGNOSIS — R0600 Dyspnea, unspecified: Secondary | ICD-10-CM

## 2018-08-15 DIAGNOSIS — I5032 Chronic diastolic (congestive) heart failure: Secondary | ICD-10-CM | POA: Diagnosis not present

## 2018-08-15 DIAGNOSIS — R600 Localized edema: Secondary | ICD-10-CM

## 2018-08-15 DIAGNOSIS — R0609 Other forms of dyspnea: Secondary | ICD-10-CM

## 2018-08-15 NOTE — Progress Notes (Signed)
Patient ID: [name], male    DOB: 1941/06/12, 77 y.o.   MRN: [id number]  PCP: Alycia Rossetti, MD  Chief Complaint  Patient presents with  . Follow-up    edema    Subjective:   [name] is a 77 y.o. male, presents to clinic with CC of CHF exacerbation, did 3 days of higher dose lasix over the weekend and is here for recheck.  He is down 12 lbs. his breathing is improved and so is his lower extremity edema.  He has been attempting to be eating better and inquires about more information for heart healthy diet.  They did not go get chest x-ray done  Has f/up with pcp in November    Wt Readings from Last 5 Encounters:  08/15/18 280 lb (127 kg)  08/10/18 292 lb (132.5 kg)  07/07/18 282 lb (127.9 kg)  06/13/18 277 lb 2 oz (125.7 kg)  06/07/18 272 lb 7.8 oz (123.6 kg)       Review of Systems  Constitutional: Negative.   HENT: Negative.   Eyes: Negative.   Respiratory: Negative.   Cardiovascular: Negative.   Gastrointestinal: Negative.   Endocrine: Negative.   Genitourinary: Negative.   Musculoskeletal: Negative.   Skin: Negative.   Allergic/Immunologic: Negative.   Neurological: Negative.   Hematological: Negative.   Psychiatric/Behavioral: Negative.   All other systems reviewed and are negative.      Objective:    Vitals:   08/15/18 1002  BP: 132/74  Pulse: 82  Resp: 16  Temp: 98.6 F (37 C)  TempSrc: Oral  SpO2: 97%  Weight: 280 lb (127 kg)  Height: 5\' 8"  (1.727 m)      Physical Exam  Constitutional: He appears well-developed and well-nourished. No distress.  Elderly morbidly obese male, no acute distress  HENT:  Head: Normocephalic and atraumatic.  Nose: Nose normal.  Mouth/Throat: Oropharynx is clear and moist. No oropharyngeal exudate.  Eyes: Conjunctivae are normal. Right eye exhibits no discharge. Left eye exhibits no discharge. No scleral icterus.  Neck: No tracheal deviation present.  Cardiovascular: Normal rate and regular  rhythm. Exam reveals distant heart sounds. Exam reveals no gallop and no friction rub.  No murmur heard. Pulses:      Radial pulses are 2+ on the right side, and 2+ on the left side.  bilateral mild pretibial edema   Pulmonary/Chest: Effort normal. No accessory muscle usage or stridor. No respiratory distress. He has no wheezes. He has no rhonchi. He has no rales.  Abdominal: Soft. Bowel sounds are normal. He exhibits no distension. There is no tenderness.  Musculoskeletal: He exhibits no deformity.  Neurological: He is alert. He exhibits normal muscle tone.  Skin: Skin is warm. Capillary refill takes less than 2 seconds. No rash noted. He is not diaphoretic. No pallor.  Psychiatric: He has a normal mood and affect. His behavior is normal.  Nursing note and vitals reviewed.         Assessment & Plan:      ICD-10-CM   1. Chronic diastolic heart failure (HCC) I50.32    exacerbation improved with diuretics, sx improved, LE edema improved and down 12 lbs  2. Bilateral lower extremity edema R60.0    Improved with increased Lasix dose for several days  3. DOE (dyspnea on exertion) R06.09    Improved with diuresis    Blood work from last week and reviewed at length.  Patient's weight is down, lower extremity edema is  improving and his breathing symptoms are better however not completely at baseline.  Discussed at length DASH diet, managing dry weights at home with diet, titration of medications.  Patient does seem more motivated at this week to work on some lifestyle changes.  Delsa Grana, PA-C 08/15/18 10:12 AM

## 2018-08-15 NOTE — Patient Instructions (Signed)
Heart Failure Heart failure means your heart has trouble pumping blood. This makes it hard for your body to work well. Heart failure is usually a long-term (chronic) condition. You must take good care of yourself and follow your doctor's treatment plan. Follow these instructions at home:  Take your heart medicine as told by your doctor. ? Do not stop taking medicine unless your doctor tells you to. ? Do not skip any dose of medicine. ? Refill your medicines before they run out. ? Take other medicines only as told by your doctor or pharmacist.  Stay active if told by your doctor. The elderly and people with severe heart failure should talk with a doctor about physical activity.  Eat heart-healthy foods. Choose foods that are without trans fat and are low in saturated fat, cholesterol, and salt (sodium). This includes fresh or frozen fruits and vegetables, fish, lean meats, fat-free or low-fat dairy foods, whole grains, and high-fiber foods. Lentils and dried peas and beans (legumes) are also good choices.  Limit salt if told by your doctor.  Cook in a healthy way. Roast, grill, broil, bake, poach, steam, or stir-fry foods.  Limit fluids as told by your doctor.  Weigh yourself every morning. Do this after you pee (urinate) and before you eat breakfast. Write down your weight to give to your doctor.  Take your blood pressure and write it down if your doctor tells you to.  Ask your doctor how to check your pulse. Check your pulse as told.  Lose weight if told by your doctor.  Stop smoking or chewing tobacco. Do not use gum or patches that help you quit without your doctor's approval.  Schedule and go to doctor visits as told.  Nonpregnant women should have no more than 1 drink a day. Men should have no more than 2 drinks a day. Talk to your doctor about drinking alcohol.  Stop illegal drug use.  Stay current with shots (immunizations).  Manage your health conditions as told by your  doctor.  Learn to manage your stress.  Rest when you are tired.  If it is really hot outside: ? Avoid intense activities. ? Use air conditioning or fans, or get in a cooler place. ? Avoid caffeine and alcohol. ? Wear loose-fitting, lightweight, and light-colored clothing.  If it is really cold outside: ? Avoid intense activities. ? Layer your clothing. ? Wear mittens or gloves, a hat, and a scarf when going outside. ? Avoid alcohol.  Learn about heart failure and get support as needed.  Get help to maintain or improve your quality of life and your ability to care for yourself as needed. Contact a doctor if:  You gain weight quickly.  You are more short of breath than usual.  You cannot do your normal activities.  You tire easily.  You cough more than normal, especially with activity.  You have any or more puffiness (swelling) in areas such as your hands, feet, ankles, or belly (abdomen).  You cannot sleep because it is hard to breathe.  You feel like your heart is beating fast (palpitations).  You get dizzy or light-headed when you stand up. Get help right away if:  You have trouble breathing.  There is a change in mental status, such as becoming less alert or not being able to focus.  You have chest pain or discomfort.  You faint. This information is not intended to replace advice given to you by your health care provider. Make sure you  discuss any questions you have with your health care provider. Document Released: 07/27/2008 Document Revised: 03/25/2016 Document Reviewed: 12/04/2012 Elsevier Interactive Patient Education  2017 [place].   Low-Sodium Eating Plan Sodium, which is an element that makes up salt, helps you maintain a healthy balance of fluids in your body. Too much sodium can increase your blood pressure and cause fluid and waste to be held in your body. Your health care provider or dietitian may recommend following this plan if you have  high blood pressure (hypertension), kidney disease, liver disease, or heart failure. Eating less sodium can help lower your blood pressure, reduce swelling, and protect your heart, liver, and kidneys. What are tips for following this plan? General guidelines  Most people on this plan should limit their sodium intake to 1,500-2,000 mg (milligrams) of sodium each day. Reading food labels  The Nutrition Facts label lists the amount of sodium in one serving of the food. If you eat more than one serving, you must multiply the listed amount of sodium by the number of servings.  Choose foods with less than 140 mg of sodium per serving.  Avoid foods with 300 mg of sodium or more per serving. Shopping  Look for lower-sodium products, often labeled as "low-sodium" or "no salt added."  Always check the sodium content even if foods are labeled as "unsalted" or "no salt added".  Buy fresh foods. ? Avoid canned foods and premade or frozen meals. ? Avoid canned, cured, or processed meats  Buy breads that have less than 80 mg of sodium per slice. Cooking  Eat more home-cooked food and less restaurant, buffet, and fast food.  Avoid adding salt when cooking. Use salt-free seasonings or herbs instead of table salt or sea salt. Check with your health care provider or pharmacist before using salt substitutes.  Cook with plant-based oils, such as canola, sunflower, or olive oil. Meal planning  When eating at a restaurant, ask that your food be prepared with less salt or no salt, if possible.  Avoid foods that contain MSG (monosodium glutamate). MSG is sometimes added to [place] food, bouillon, and some canned foods. What foods are recommended? The items listed may not be a complete list. Talk with your dietitian about what dietary choices are best for you. Grains Low-sodium cereals, including oats, puffed wheat and rice, and shredded wheat. Low-sodium crackers. Unsalted rice. Unsalted pasta.  Low-sodium bread. Whole-grain breads and whole-grain pasta. Vegetables Fresh or frozen vegetables. "No salt added" canned vegetables. "No salt added" tomato sauce and paste. Low-sodium or reduced-sodium tomato and vegetable juice. Fruits Fresh, frozen, or canned fruit. Fruit juice. Meats and other protein foods Fresh or frozen (no salt added) meat, poultry, seafood, and fish. Low-sodium canned tuna and salmon. Unsalted nuts. Dried peas, beans, and lentils without added salt. Unsalted canned beans. Eggs. Unsalted nut butters. Dairy Milk. Soy milk. Cheese that is naturally low in sodium, such as ricotta cheese, fresh mozzarella, or Swiss cheese Low-sodium or reduced-sodium cheese. Cream cheese. Yogurt. Fats and oils Unsalted butter. Unsalted margarine with no trans fat. Vegetable oils such as canola or olive oils. Seasonings and other foods Fresh and dried herbs and spices. Salt-free seasonings. Low-sodium mustard and ketchup. Sodium-free salad dressing. Sodium-free light mayonnaise. Fresh or refrigerated horseradish. Lemon juice. Vinegar. Homemade, reduced-sodium, or low-sodium soups. Unsalted popcorn and pretzels. Low-salt or salt-free chips. What foods are not recommended? The items listed may not be a complete list. Talk with your dietitian about what dietary choices are best  for you. Grains Instant hot cereals. Bread stuffing, pancake, and biscuit mixes. Croutons. Seasoned rice or pasta mixes. Noodle soup cups. Boxed or frozen macaroni and cheese. Regular salted crackers. Self-rising flour. Vegetables Sauerkraut, pickled vegetables, and relishes. Olives. [place] fries. Onion rings. Regular canned vegetables (not low-sodium or reduced-sodium). Regular canned tomato sauce and paste (not low-sodium or reduced-sodium). Regular tomato and vegetable juice (not low-sodium or reduced-sodium). Frozen vegetables in sauces. Meats and other protein foods Meat or fish that is salted, canned, smoked, spiced,  or pickled. Bacon, ham, sausage, hotdogs, corned beef, chipped beef, packaged lunch meats, salt pork, jerky, pickled herring, anchovies, regular canned tuna, sardines, salted nuts. Dairy Processed cheese and cheese spreads. Cheese curds. Blue cheese. Feta cheese. String cheese. Regular cottage cheese. Buttermilk. Canned milk. Fats and oils Salted butter. Regular margarine. Ghee. Bacon fat. Seasonings and other foods Onion salt, garlic salt, seasoned salt, table salt, and sea salt. Canned and packaged gravies. Worcestershire sauce. Tartar sauce. Barbecue sauce. Teriyaki sauce. Soy sauce, including reduced-sodium. Steak sauce. Fish sauce. Oyster sauce. Cocktail sauce. Horseradish that you find on the shelf. Regular ketchup and mustard. Meat flavorings and tenderizers. Bouillon cubes. Hot sauce and Tabasco sauce. Premade or packaged marinades. Premade or packaged taco seasonings. Relishes. Regular salad dressings. Salsa. Potato and tortilla chips. Corn chips and puffs. Salted popcorn and pretzels. Canned or dried soups. Pizza. Frozen entrees and pot pies. Summary  Eating less sodium can help lower your blood pressure, reduce swelling, and protect your heart, liver, and kidneys.  Most people on this plan should limit their sodium intake to 1,500-2,000 mg (milligrams) of sodium each day.  Canned, boxed, and frozen foods are high in sodium. Restaurant foods, fast foods, and pizza are also very high in sodium. You also get sodium by adding salt to food.  Try to cook at home, eat more fresh fruits and vegetables, and eat less fast food, canned, processed, or prepared foods. This information is not intended to replace advice given to you by your health care provider. Make sure you discuss any questions you have with your health care provider. Document Released: 04/09/2002 Document Revised: 10/11/2016 Document Reviewed: 10/11/2016 Elsevier Interactive Patient Education  [employer].

## 2018-08-16 LAB — TEST AUTHORIZATION

## 2018-08-16 LAB — COMPLETE METABOLIC PANEL WITH GFR
AG Ratio: 1.4 (calc) (ref 1.0–2.5)
ALT: 13 U/L (ref 9–46)
AST: 14 U/L (ref 10–35)
Albumin: 3.8 g/dL (ref 3.6–5.1)
Alkaline phosphatase (APISO): 35 U/L — ABNORMAL LOW (ref 40–115)
BUN/Creatinine Ratio: 17 (calc) (ref 6–22)
BUN: 22 mg/dL (ref 7–25)
CO2: 26 mmol/L (ref 20–32)
Calcium: 9.6 mg/dL (ref 8.6–10.3)
Chloride: 106 mmol/L (ref 98–110)
Creat: 1.33 mg/dL — ABNORMAL HIGH (ref 0.70–1.18)
GFR, Est African American: 60 mL/min/{1.73_m2} (ref >=60)
GFR, Est Non African American: 52 mL/min/{1.73_m2} — ABNORMAL LOW (ref >=60)
Globulin: 2.8 g/dL (calc) (ref 1.9–3.7)
Glucose, Bld: 66 mg/dL (ref 65–99)
Potassium: 4.7 mmol/L (ref 3.5–5.3)
Sodium: 142 mmol/L (ref 135–146)
Total Bilirubin: 0.5 mg/dL (ref 0.2–1.2)
Total Protein: 6.6 g/dL (ref 6.1–8.1)

## 2018-08-16 LAB — CBC WITH DIFFERENTIAL/PLATELET
Basophils Absolute: 18 cells/uL (ref 0–200)
Basophils Relative: 0.3 %
Eosinophils Absolute: 118 cells/uL (ref 15–500)
Eosinophils Relative: 2 %
HCT: 30.6 % — ABNORMAL LOW (ref 38.5–50.0)
Hemoglobin: 10 g/dL — ABNORMAL LOW (ref 13.2–17.1)
Lymphs Abs: 1717 cells/uL (ref 850–3900)
MCH: 31.3 pg (ref 27.0–33.0)
MCHC: 32.7 g/dL (ref 32.0–36.0)
MCV: 95.9 fL (ref 80.0–100.0)
MPV: 10.5 fL (ref 7.5–12.5)
Monocytes Relative: 6.1 %
Neutro Abs: 3688 cells/uL (ref 1500–7800)
Neutrophils Relative %: 62.5 %
Platelets: 212 10*3/uL (ref 140–400)
RBC: 3.19 10*6/uL — ABNORMAL LOW (ref 4.20–5.80)
RDW: 14.7 % (ref 11.0–15.0)
Total Lymphocyte: 29.1 %
WBC mixed population: 360 cells/uL (ref 200–950)
WBC: 5.9 10*3/uL (ref 3.8–10.8)

## 2018-08-16 LAB — HEMOGLOBIN A1C W/OUT EAG: Hgb A1c MFr Bld: 7.1 % of total Hgb — ABNORMAL HIGH (ref <5.7)

## 2018-08-20 ENCOUNTER — Other Ambulatory Visit: Payer: Self-pay | Admitting: Family Medicine

## 2018-08-21 ENCOUNTER — Telehealth: Payer: Self-pay | Admitting: *Deleted

## 2018-08-21 DIAGNOSIS — Z794 Long term (current) use of insulin: Secondary | ICD-10-CM

## 2018-08-21 DIAGNOSIS — E1122 Type 2 diabetes mellitus with diabetic chronic kidney disease: Secondary | ICD-10-CM

## 2018-08-21 DIAGNOSIS — N183 Chronic kidney disease, stage 3 (moderate): Principal | ICD-10-CM

## 2018-08-21 NOTE — Telephone Encounter (Signed)
Received call from patient wife Vaughan Basta.   Reports that patient would like referral to dietician. Ok to place orders?

## 2018-08-22 NOTE — Telephone Encounter (Signed)
Yes, great thanks

## 2018-08-22 NOTE — Telephone Encounter (Signed)
Call placed to patient and patient made aware.  

## 2018-09-18 DIAGNOSIS — Z6841 Body Mass Index (BMI) 40.0 and over, adult: Secondary | ICD-10-CM | POA: Diagnosis not present

## 2018-09-18 DIAGNOSIS — N400 Enlarged prostate without lower urinary tract symptoms: Secondary | ICD-10-CM | POA: Diagnosis not present

## 2018-09-18 DIAGNOSIS — N529 Male erectile dysfunction, unspecified: Secondary | ICD-10-CM | POA: Diagnosis not present

## 2018-09-18 DIAGNOSIS — E785 Hyperlipidemia, unspecified: Secondary | ICD-10-CM | POA: Diagnosis not present

## 2018-09-18 DIAGNOSIS — I2510 Atherosclerotic heart disease of native coronary artery without angina pectoris: Secondary | ICD-10-CM | POA: Diagnosis not present

## 2018-09-18 DIAGNOSIS — I10 Essential (primary) hypertension: Secondary | ICD-10-CM | POA: Diagnosis not present

## 2018-09-18 DIAGNOSIS — E119 Type 2 diabetes mellitus without complications: Secondary | ICD-10-CM | POA: Diagnosis not present

## 2018-09-18 DIAGNOSIS — Z794 Long term (current) use of insulin: Secondary | ICD-10-CM | POA: Diagnosis not present

## 2018-09-18 DIAGNOSIS — R0602 Shortness of breath: Secondary | ICD-10-CM | POA: Diagnosis not present

## 2018-09-25 ENCOUNTER — Encounter: Payer: Self-pay | Admitting: Family Medicine

## 2018-09-25 ENCOUNTER — Other Ambulatory Visit: Payer: Self-pay

## 2018-09-25 ENCOUNTER — Ambulatory Visit (INDEPENDENT_AMBULATORY_CARE_PROVIDER_SITE_OTHER): Payer: Medicare HMO | Admitting: Family Medicine

## 2018-09-25 VITALS — BP 136/64 | HR 78 | Temp 98.2°F | Resp 14 | Ht 68.0 in | Wt 275.0 lb

## 2018-09-25 DIAGNOSIS — I739 Peripheral vascular disease, unspecified: Secondary | ICD-10-CM | POA: Diagnosis not present

## 2018-09-25 DIAGNOSIS — I5032 Chronic diastolic (congestive) heart failure: Secondary | ICD-10-CM

## 2018-09-25 DIAGNOSIS — R609 Edema, unspecified: Secondary | ICD-10-CM | POA: Diagnosis not present

## 2018-09-25 DIAGNOSIS — I10 Essential (primary) hypertension: Secondary | ICD-10-CM | POA: Diagnosis not present

## 2018-09-25 DIAGNOSIS — E1122 Type 2 diabetes mellitus with diabetic chronic kidney disease: Secondary | ICD-10-CM | POA: Diagnosis not present

## 2018-09-25 DIAGNOSIS — Z794 Long term (current) use of insulin: Secondary | ICD-10-CM | POA: Diagnosis not present

## 2018-09-25 DIAGNOSIS — Z8673 Personal history of transient ischemic attack (TIA), and cerebral infarction without residual deficits: Secondary | ICD-10-CM

## 2018-09-25 DIAGNOSIS — N183 Chronic kidney disease, stage 3 unspecified: Secondary | ICD-10-CM

## 2018-09-25 MED ORDER — INSULIN ASPART PROT & ASPART (70-30 MIX) 100 UNIT/ML ~~LOC~~ SUSP: 40.0000 [IU] | Freq: Every day | SUBCUTANEOUS | 0 refills | Status: DC

## 2018-09-25 NOTE — Assessment & Plan Note (Signed)
Currently compensated no changes to diuretic

## 2018-09-25 NOTE — Patient Instructions (Addendum)
F/U 4 months  Dash diet  DASH Eating Plan DASH stands for "Dietary Approaches to Stop Hypertension." The DASH eating plan is a healthy eating plan that has been shown to reduce high blood pressure (hypertension). It may also reduce your risk for type 2 diabetes, heart disease, and stroke. The DASH eating plan may also help with weight loss. What are tips for following this plan? General guidelines  Avoid eating more than 2,300 mg (milligrams) of salt (sodium) a day. If you have hypertension, you may need to reduce your sodium intake to 1,500 mg a day.  Limit alcohol intake to no more than 1 drink a day for nonpregnant women and 2 drinks a day for men. One drink equals 12 oz of beer, 5 oz of wine, or 1 oz of hard liquor.  Work with your health care provider to maintain a healthy body weight or to lose weight. Ask what an ideal weight is for you.  Get at least 30 minutes of exercise that causes your heart to beat faster (aerobic exercise) most days of the week. Activities may include walking, swimming, or biking.  Work with your health care provider or diet and nutrition specialist (dietitian) to adjust your eating plan to your individual calorie needs. Reading food labels  Check food labels for the amount of sodium per serving. Choose foods with less than 5 percent of the Daily Value of sodium. Generally, foods with less than 300 mg of sodium per serving fit into this eating plan.  To find whole grains, look for the word "whole" as the first word in the ingredient list. Shopping  Buy products labeled as "low-sodium" or "no salt added."  Buy fresh foods. Avoid canned foods and premade or frozen meals. Cooking  Avoid adding salt when cooking. Use salt-free seasonings or herbs instead of table salt or sea salt. Check with your health care provider or pharmacist before using salt substitutes.  Do not fry foods. Cook foods using healthy methods such as baking, boiling, grilling, and  broiling instead.  Cook with heart-healthy oils, such as olive, canola, soybean, or sunflower oil. Meal planning   Eat a balanced diet that includes: ? 5 or more servings of fruits and vegetables each day. At each meal, try to fill half of your plate with fruits and vegetables. ? Up to 6-8 servings of whole grains each day. ? Less than 6 oz of lean meat, poultry, or fish each day. A 3-oz serving of meat is about the same size as a deck of cards. One egg equals 1 oz. ? 2 servings of low-fat dairy each day. ? A serving of nuts, seeds, or beans 5 times each week. ? Heart-healthy fats. Healthy fats called Omega-3 fatty acids are found in foods such as flaxseeds and coldwater fish, like sardines, salmon, and mackerel.  Limit how much you eat of the following: ? Canned or prepackaged foods. ? Food that is high in trans fat, such as fried foods. ? Food that is high in saturated fat, such as fatty meat. ? Sweets, desserts, sugary drinks, and other foods with added sugar. ? Full-fat dairy products.  Do not salt foods before eating.  Try to eat at least 2 vegetarian meals each week.  Eat more home-cooked food and less restaurant, buffet, and fast food.  When eating at a restaurant, ask that your food be prepared with less salt or no salt, if possible. What foods are recommended? The items listed may not be a complete  list. Talk with your dietitian about what dietary choices are best for you. Grains Whole-grain or whole-wheat bread. Whole-grain or whole-wheat pasta. Brown rice. [place]. Bulgur. Whole-grain and low-sodium cereals. Pita bread. Low-fat, low-sodium crackers. Whole-wheat flour tortillas. Vegetables Fresh or frozen vegetables (raw, steamed, roasted, or grilled). Low-sodium or reduced-sodium tomato and vegetable juice. Low-sodium or reduced-sodium tomato sauce and tomato paste. Low-sodium or reduced-sodium canned vegetables. Fruits All fresh, dried, or frozen fruit. Canned  fruit in natural juice (without added sugar). Meat and other protein foods Skinless chicken or [place]. Ground chicken or [place]. Pork with fat trimmed off. Fish and seafood. Egg whites. Dried beans, peas, or lentils. Unsalted nuts, nut butters, and seeds. Unsalted canned beans. Lean cuts of beef with fat trimmed off. Low-sodium, lean deli meat. Dairy Low-fat (1%) or fat-free (skim) milk. Fat-free, low-fat, or reduced-fat cheeses. Nonfat, low-sodium ricotta or cottage cheese. Low-fat or nonfat yogurt. Low-fat, low-sodium cheese. Fats and oils Soft margarine without trans fats. Vegetable oil. Low-fat, reduced-fat, or light mayonnaise and salad dressings (reduced-sodium). Canola, safflower, olive, soybean, and sunflower oils. Avocado. Seasoning and other foods Herbs. Spices. Seasoning mixes without salt. Unsalted popcorn and pretzels. Fat-free sweets. What foods are not recommended? The items listed may not be a complete list. Talk with your dietitian about what dietary choices are best for you. Grains Baked goods made with fat, such as croissants, muffins, or some breads. Dry pasta or rice meal packs. Vegetables Creamed or fried vegetables. Vegetables in a cheese sauce. Regular canned vegetables (not low-sodium or reduced-sodium). Regular canned tomato sauce and paste (not low-sodium or reduced-sodium). Regular tomato and vegetable juice (not low-sodium or reduced-sodium). Angie Fava. Olives. Fruits Canned fruit in a light or heavy syrup. Fried fruit. Fruit in cream or butter sauce. Meat and other protein foods Fatty cuts of meat. Ribs. Fried meat. Berniece Salines. Sausage. Bologna and other processed lunch meats. Salami. Fatback. Hotdogs. Bratwurst. Salted nuts and seeds. Canned beans with added salt. Canned or smoked fish. Whole eggs or egg yolks. Chicken or [place] with skin. Dairy Whole or 2% milk, cream, and half-and-half. Whole or full-fat cream cheese. Whole-fat or sweetened yogurt. Full-fat cheese.  Nondairy creamers. Whipped toppings. Processed cheese and cheese spreads. Fats and oils Butter. Stick margarine. Lard. Shortening. Ghee. Bacon fat. Tropical oils, such as coconut, palm kernel, or palm oil. Seasoning and other foods Salted popcorn and pretzels. Onion salt, garlic salt, seasoned salt, table salt, and sea salt. Worcestershire sauce. Tartar sauce. Barbecue sauce. Teriyaki sauce. Soy sauce, including reduced-sodium. Steak sauce. Canned and packaged gravies. Fish sauce. Oyster sauce. Cocktail sauce. Horseradish that you find on the shelf. Ketchup. Mustard. Meat flavorings and tenderizers. Bouillon cubes. Hot sauce and Tabasco sauce. Premade or packaged marinades. Premade or packaged taco seasonings. Relishes. Regular salad dressings. Where to find more information:  National Heart, Lung, and [place]: https://wilson-eaton.com/  American Heart Association: www.heart.org Summary  The DASH eating plan is a healthy eating plan that has been shown to reduce high blood pressure (hypertension). It may also reduce your risk for type 2 diabetes, heart disease, and stroke.  With the DASH eating plan, you should limit salt (sodium) intake to 2,300 mg a day. If you have hypertension, you may need to reduce your sodium intake to 1,500 mg a day.  When on the DASH eating plan, aim to eat more fresh fruits and vegetables, whole grains, lean proteins, low-fat dairy, and heart-healthy fats.  Work with your health care provider or diet and nutrition specialist (dietitian) to  adjust your eating plan to your individual calorie needs. This information is not intended to replace advice given to you by your health care provider. Make sure you discuss any questions you have with your health care provider. Document Released: 10/07/2011 Document Revised: 10/11/2016 Document Reviewed: 10/11/2016 Elsevier Interactive Patient Education  [employer].

## 2018-09-25 NOTE — Progress Notes (Signed)
   Subjective:    Patient ID: [name], male    DOB: 05-05-1941, 77 y.o.   MRN: [id number]  Patient presents for Follow-up (is fasting)  Here to follow-up chronic medical problems.  He is a very complex 77 year old gentleman with diabetes as well as congestive heart failure with history of cerebral infarctions.   He had to be treated with high-dose Lasix back in October secondary to fluid overload.  Back on his maintenance dosing.  Weight is down he is to maintain around 275   Diabetes mellitus this is followed by the [facility] as well as myself.  He is currently on 40 units in the morning and   He stopped taking the night time insulin, due to hypoglycemia, CBG range 122-140 fasting, no further hypoglycemia symptoms  Also on metformin Sees podiatrist at [facility] every 3 months   Last A1C 7.1% in October  HE is planning to return to see dietician   He is followed by ophthalmology with [place] and at [facility]  He has had surgeries for his Pseudophakia/Glaucoma has a few more visits . Legally blind right eye  OSA- on CPAP From the [place]  Review Of Systems:  GEN- denies fatigue, fever, weight loss,weakness, recent illness HEENT- denies eye drainage, change in vision, nasal discharge, CVS- denies chest pain, palpitations RESP- denies SOB, cough, wheeze ABD- denies N/V, change in stools, abd pain GU- denies dysuria, hematuria, dribbling, incontinence MSK- denies joint pain, muscle aches, injury Neuro- denies headache, dizziness, syncope, seizure activity       Objective:    BP 136/64   Pulse 78   Temp 98.2 F (36.8 C) (Oral)   Resp 14   Ht 5\' 8"  (1.727 m)   Wt 275 lb (124.7 kg)   SpO2 98%   BMI 41.81 kg/m  GEN- NAD, alert and oriented x3 HEENT- PERRL, EOMI, non injected sclera, pink conjunctiva, MMM, oropharynx clear Neck- Supple, no thyromegaly CVS- RRR, no murmur RESP-CTAB ABD-NABS,soft,NT,ND EXT-pedal edema L >R Pulses- Radial 2+, DP- diminised+         Assessment & Plan:      Problem List Items Addressed This Visit      Unprioritized   Chronic diastolic heart failure ([place]) - Primary    Currently compensated no changes to diuretic       Chronic kidney disease    Renal function has been stable      DM (diabetes mellitus) type II controlled with renal manifestation (HCC)    Secondary to hypoglycemia, since he has been stable on the morning dose of 40 units only , will continue his recent A1C reflects this dosing for the past 2 months  Continue to monitor   He is restablishing with dietician, asked about sodium, needs less than 2g a day, given dash diet handout      Relevant Medications   insulin aspart protamine- aspart (NOVOLOG MIX 70/30) (70-30) 100 UNIT/ML injection   Other Relevant Orders   [facility] DIABETES FOOT EXAM (Completed)   History of stroke   HTN (hypertension) (Chronic)    Well controlled no changes      Peripheral edema   PVD (peripheral vascular disease) ([place])      Note: This dictation was prepared with Dragon dictation along with smaller phrase technology. Any transcriptional errors that result from this process are unintentional.

## 2018-09-25 NOTE — Assessment & Plan Note (Signed)
Well controlled no changes 

## 2018-09-25 NOTE — Assessment & Plan Note (Signed)
Renal function has been stable

## 2018-09-25 NOTE — Assessment & Plan Note (Addendum)
Secondary to hypoglycemia, since he has been stable on the morning dose of 40 units only , will continue his recent A1C reflects this dosing for the past 2 months  Continue to monitor   He is restablishing with dietician, asked about sodium, needs less than 2g a day, given dash diet handout

## 2018-10-05 ENCOUNTER — Encounter: Payer: Medicare HMO | Attending: Family Medicine | Admitting: Nutrition

## 2018-10-05 VITALS — Ht 68.0 in | Wt 280.0 lb

## 2018-10-05 DIAGNOSIS — E1165 Type 2 diabetes mellitus with hyperglycemia: Secondary | ICD-10-CM | POA: Diagnosis not present

## 2018-10-05 DIAGNOSIS — IMO0002 Reserved for concepts with insufficient information to code with codable children: Secondary | ICD-10-CM

## 2018-10-05 DIAGNOSIS — E118 Type 2 diabetes mellitus with unspecified complications: Secondary | ICD-10-CM | POA: Insufficient documentation

## 2018-10-05 NOTE — Progress Notes (Signed)
  Medical Nutrition Therapy:  Appt start time: 0930  end time: 1000  Assessment:  Primary concerns today: Diabetes. Type 2. A1C 7.1 %.  He has been seen here in 2018. FBS: 120-130's's. Evening 140-150's. Needs to lose weight. Working on foods baked and broiled and taking Mrs. Dash. Eye sturgery on left has been done. Had been walking every morning.  Has a treadmill at home and will start using it.   In ICU for double pneumonia and sepsis and had CHF a few months ago. Wants to lose weight and improve his DM . Metformin 1000 mg BID. Taking 70/30  40 units in am. Isn't taking any at night because it drops his blood sugars in the middle of the night. Preferred Learning Style: hands on and verbals.. Vision limited  Lab Results  Component Value Date   HGBA1C 7.1 (H) 08/10/2018    No preference indicated   Learning Readiness:     Ready  Change in progress   MEDICATIONS: See list   DIETARY INTAKE:  24-hr recall:  B ( AM): Berniece Salines crissant.   Snk ( AM):   L ( PM): baked chicken and rice,  Water Snk ( PM): none D ( PM): baked chicken, string beans and mashed potatoes.  Usual physical activity: walkling a little  Estimated energy needs: 1800 calories 200 g carbohydrates 135 g protein 50 g fat  Progress Towards Goal(s):  In progress.   Nutritional Diagnosis:  NB-1.1 Food and nutrition-related knowledge deficit As related to DIabetes.  As evidenced by A1C 7.2%.    Intervention:  Nutrition and Diabetes education provided on My Plate, CHO counting, meal planning, portion sizes, timing of meals, avoiding snacks between meals unless having a low blood sugar, target ranges for A1C and blood sugars, signs/symptoms and treatment of hyper/hypoglycemia, monitoring blood sugars, taking medications as prescribed, benefits of exercising 30 minutes per day and prevention of  complications of DM.  Goals 1. Walk daily 15 minutes  2. Cut out processed and salty foods 3. Watch portion sizes 5.  Don't skip meals.  Teaching Method Utilized:  Visual Auditory Hands on  Handouts given during visit include:  The Plate Method  Meal Plan Card   Barriers to learning/adherence to lifestyle change:  None  Demonstrated degree of understanding via:  Teach Back   Monitoring/Evaluation:  Dietary intake, exercise, meal planning, SBG, and body weight in 3 month(s).  He may benefit from a GLP1 for needed weight loss and resume a lower dose of 70/30 at night.

## 2018-10-05 NOTE — Patient Instructions (Signed)
Goals 1. Walk daily 15 minutes  2. Cut out processed and salty foods 3. Watch portion sizes 5. Don't skip meals.

## 2018-10-10 ENCOUNTER — Encounter: Payer: Self-pay | Admitting: Nutrition

## 2018-11-20 ENCOUNTER — Other Ambulatory Visit: Payer: Self-pay | Admitting: Family Medicine

## 2018-12-06 ENCOUNTER — Encounter: Payer: Commercial Managed Care - HMO | Attending: Family Medicine | Admitting: Nutrition

## 2018-12-06 VITALS — Ht 68.0 in | Wt 278.0 lb

## 2018-12-06 DIAGNOSIS — E118 Type 2 diabetes mellitus with unspecified complications: Secondary | ICD-10-CM | POA: Insufficient documentation

## 2018-12-06 DIAGNOSIS — E1165 Type 2 diabetes mellitus with hyperglycemia: Secondary | ICD-10-CM | POA: Diagnosis not present

## 2018-12-06 DIAGNOSIS — IMO0002 Reserved for concepts with insufficient information to code with codable children: Secondary | ICD-10-CM

## 2018-12-06 NOTE — Progress Notes (Signed)
  Medical Nutrition Therapy:  Appt start time: 0930  end time: 1000  Assessment:  Primary concerns today: Diabetes. Type 2. A1C 7.1 %. Sees Dr. Buelah Manis. Lost 2 lbs. Says he hasn't  Been able to be very active due to cold and wet weather. . Eating habits are little better but not consistent. He admits to knowing he needs to do better.     Still on Metformin 1000 mg BID and 70/30 40 units in am.  A1C  Will be done in in next few weeks.  Needs more lower carb veggies and protein to stay full and not be hungry to snack.    Lab Results  Component Value Date   HGBA1C 7.1 (H) 08/10/2018    No preference indicated   Learning Readiness:     Ready  Change in progress   MEDICATIONS: See list   DIETARY INTAKE:  24-hr recall:  B ( AM): Cherrios, banana, Water and coffee  Snk ( AM):   L ( PM): [place] sandwich on wheat bread,  Mayo, Water Snk ( PM): none D ( PM): Orange chicken, breast, water  Usual physical activity: walkling a little  Estimated energy needs: 1800 calories 200 g carbohydrates 135 g protein 50 g fat  Progress Towards Goal(s):  In progress.   Nutritional Diagnosis:  NB-1.1 Food and nutrition-related knowledge deficit As related to DIabetes.  As evidenced by A1C 7.2%.    Intervention:  Nutrition and Diabetes education provided on My Plate, CHO counting, meal planning, portion sizes, timing of meals, avoiding snacks between meals unless having a low blood sugar, target ranges for A1C and blood sugars, signs/symptoms and treatment of hyper/hypoglycemia, monitoring blood sugars, taking medications as prescribed, benefits of exercising 30 minutes per day and prevention of  complications of DM.    Goals 1. B- 2 eggs, 1 cup oatmeal,  And fruit, cinnamon to flavor     Or  2 eggs, 2 slices [employer] toast and 1/2 cup fruit           2 eggs, Cherrios 1 cup, 1 skim milk 2. Add some veggies with sandwich at lunch time and fruit, 3. Add veggies and starch and fruit with  dinner  Walk 5-10 minutes a day   Teaching Method Utilized:  Visual Auditory Hands on  Handouts given during visit include:  The Plate Method  Meal Plan Card   Barriers to learning/adherence to lifestyle change:  None  Demonstrated degree of understanding via:  Teach Back   Monitoring/Evaluation:  Dietary intake, exercise, meal planning, SBG, and body weight in 3 month(s).  He may benefit from a GLP1 for needed weight loss and resume a lower dose of 70/30 at night.

## 2018-12-06 NOTE — Patient Instructions (Signed)
Goals 1. B- 2 eggs, 1 cup oatmeal,  And fruit, cinnamon to flavor     Or  2 eggs, 2 slices [employer] toast and 1/2 cup fruit           2 eggs, Cherrios 1 cup, 1 skim milk 2. Add some veggies with sandwich at lunch time and fruit, 3. Add veggies and starch and fruit with dinner  Walk 5-10 minutes a day

## 2018-12-11 ENCOUNTER — Encounter: Payer: Self-pay | Admitting: Nutrition

## 2019-01-26 ENCOUNTER — Ambulatory Visit: Payer: Medicare HMO | Admitting: Family Medicine

## 2019-02-02 ENCOUNTER — Ambulatory Visit (INDEPENDENT_AMBULATORY_CARE_PROVIDER_SITE_OTHER): Payer: Medicare Other | Admitting: Family Medicine

## 2019-02-02 ENCOUNTER — Other Ambulatory Visit: Payer: Self-pay

## 2019-02-02 DIAGNOSIS — I5032 Chronic diastolic (congestive) heart failure: Secondary | ICD-10-CM | POA: Diagnosis not present

## 2019-02-02 DIAGNOSIS — E1122 Type 2 diabetes mellitus with diabetic chronic kidney disease: Secondary | ICD-10-CM

## 2019-02-02 DIAGNOSIS — N183 Chronic kidney disease, stage 3 unspecified: Secondary | ICD-10-CM

## 2019-02-02 DIAGNOSIS — K5900 Constipation, unspecified: Secondary | ICD-10-CM

## 2019-02-02 DIAGNOSIS — I10 Essential (primary) hypertension: Secondary | ICD-10-CM | POA: Diagnosis not present

## 2019-02-02 DIAGNOSIS — Z8673 Personal history of transient ischemic attack (TIA), and cerebral infarction without residual deficits: Secondary | ICD-10-CM

## 2019-02-02 DIAGNOSIS — Z794 Long term (current) use of insulin: Secondary | ICD-10-CM

## 2019-02-02 DIAGNOSIS — R609 Edema, unspecified: Secondary | ICD-10-CM

## 2019-02-02 NOTE — Progress Notes (Signed)
Virtual Visit via Telephone Note  I connected with [name] on 02/02/19 at 3:04pm by telephone and verified that I am speaking with the correct person using two identifiers.     Pt location: in home   Physician Location: in office, [place], Vic Blackbird MD   I discussed the limitations, risks, security and privacy concerns of performing an evaluation and management service by telephone and the availability of in person appointments. I also discussed with the patient that there may be a patient responsible charge related to this service. The patient expressed understanding and agreed to proceed.   History of Present Illness:  DM- recent CBG fasting 118, 107, 163, 136   Evening 248   Taking metformin and Novolog 70/30  -  40 units in AM  Last A1C 7.1% in Oct 2019    Left foot/ankle- wearing compression home, right leg [place]    Taking lasix 20mg      Constipation- CYClosprine 25mg  twice a day , duralax given today, and he did have a bowel movement, before had not had a BM    HTN- has not checked BP ,taking meds     Seen by [facility] 1st December - told labs were stable      Observations/Objective: Telephone visit   Assessment and Plan: Peripheral edema- increase to 40mg  x 2 days , wear compression hose, elevate feet   DM- CBG overall look okay fasting, no change to Novolog ( 70/30) 40 units in AM or the metformin, continue statin drug, had labs done at [place]  Discussed diet, which has been long standing issue, too many carbs/ salt   Constipation- add water and fiber to diet, had BM after laxative ,  HTN- check BP at home, call for readings > 140/90   Follow Up Instructions:    I discussed the assessment and treatment plan with the patient. The patient was provided an opportunity to ask questions and all were answered. The patient agreed with the plan and demonstrated an understanding of the instructions.   The patient was advised to call back or seek an  in-person evaluation if the symptoms worsen or if the condition fails to improve as anticipated.  I provided 14 minutes of non-face-to-face time during this encounter.  End Time: 3:18  Vic Blackbird, MD

## 2019-02-04 ENCOUNTER — Encounter: Payer: Self-pay | Admitting: Family Medicine

## 2019-02-12 ENCOUNTER — Ambulatory Visit: Payer: Non-veteran care | Admitting: Nutrition

## 2019-02-17 ENCOUNTER — Other Ambulatory Visit: Payer: Self-pay | Admitting: Family Medicine

## 2019-02-21 ENCOUNTER — Other Ambulatory Visit: Payer: Self-pay | Admitting: Family Medicine

## 2019-02-23 ENCOUNTER — Other Ambulatory Visit: Payer: Self-pay | Admitting: *Deleted

## 2019-02-23 MED ORDER — FUROSEMIDE 20 MG PO TABS: 20.0000 mg | ORAL_TABLET | Freq: Every day | ORAL | 0 refills | Status: DC

## 2019-02-24 ENCOUNTER — Other Ambulatory Visit: Payer: Self-pay | Admitting: Family Medicine

## 2019-03-21 ENCOUNTER — Encounter: Payer: Non-veteran care | Attending: Family Medicine | Admitting: Nutrition

## 2019-03-21 ENCOUNTER — Other Ambulatory Visit: Payer: Self-pay

## 2019-03-21 ENCOUNTER — Telehealth: Payer: Self-pay | Admitting: Cardiology

## 2019-03-21 ENCOUNTER — Other Ambulatory Visit: Payer: Self-pay | Admitting: Cardiology

## 2019-03-21 ENCOUNTER — Encounter: Payer: Self-pay | Admitting: Nutrition

## 2019-03-21 VITALS — Ht 68.0 in | Wt 281.0 lb

## 2019-03-21 DIAGNOSIS — E118 Type 2 diabetes mellitus with unspecified complications: Secondary | ICD-10-CM | POA: Insufficient documentation

## 2019-03-21 DIAGNOSIS — E1165 Type 2 diabetes mellitus with hyperglycemia: Secondary | ICD-10-CM | POA: Insufficient documentation

## 2019-03-21 DIAGNOSIS — IMO0002 Reserved for concepts with insufficient information to code with codable children: Secondary | ICD-10-CM

## 2019-03-21 MED ORDER — FUROSEMIDE 20 MG PO TABS: 20.0000 mg | ORAL_TABLET | Freq: Every day | ORAL | 0 refills | Status: DC

## 2019-03-21 NOTE — Telephone Encounter (Signed)
°*  STAT* If patient is at the pharmacy, call can be transferred to refill team.   1. Which medications need to be refilled? (furosemide (LASIX) 20 MG tablet    2. Which pharmacy/location (including street and city if local pharmacy) is medication to be sent to? [place]   3. Do they need a 30 day or 90 day supply?

## 2019-03-21 NOTE — Patient Instructions (Signed)
Goals Increase walking to 30 minutes a day Drink water 2. Add some veggies with sandwich at lunch time and fruit, 3. Add veggies  and fruit with dinner Lose 5 lbs in the next 6 month

## 2019-03-21 NOTE — Progress Notes (Signed)
  Medical Nutrition Therapy:  Appt start time: 1430  end tiime 4235  Assessment:  Primary concerns today: Diabetes. Type 2. A1C 7.1 %. Sees Dr. Buelah Manis. Talked to MD about a month ago. No changes in medications. FBS: 100-130 am  BS 100-150's before dinner. Metformin 40 units 70 30 in am and 50 units at night. Hasn't been very active due to COVID 19. Admits he needs to get out and walk some. Uses a walker for stablity.  Will talk to [facility] for phone visit soon.  He thinks he has gained some weight from staying inside. 3 lbs. Willing to work on increasing activity when he can.  Lab Results  Component Value Date   HGBA1C 7.1 (H) 08/10/2018    No preference indicated   Learning Readiness:     Ready  Change in progress   MEDICATIONS: See list   DIETARY INTAKE:  24-hr recall:  B ( AM): Cherrios, banana, Water and coffee  Snk ( AM):   L ( PM): [place] sandwich on wheat bread,  Mayo, Water Snk ( PM): none D ( PM): Orange chicken, breast, water  Usual physical activity: walkling a little  Estimated energy needs: 1800 calories 200 g carbohydrates 135 g protein 50 g fat  Progress Towards Goal(s):  In progress.   Nutritional Diagnosis:  NB-1.1 Food and nutrition-related knowledge deficit As related to DIabetes.  As evidenced by A1C 7.2%.    Intervention:  Nutrition and Diabetes education provided on My Plate, CHO counting, meal planning, portion sizes, timing of meals, avoiding snacks between meals unless having a low blood sugar, target ranges for A1C and blood sugars, signs/symptoms and treatment of hyper/hypoglycemia, monitoring blood sugars, taking medications as prescribed, benefits of exercising 30 minutes per day and prevention of  complications of DM.    Goals Increase walking to 30 minutes a day Drink water 2. Add some veggies with sandwich at lunch time and fruit, 3. Add veggies  and fruit with dinner Lose 5 lbs in the next 6 months     Teaching Method Utilized:   Verbal over the phone  Handouts given during visit include:    Barriers to learning/adherence to lifestyle change:  None  Demonstrated degree of understanding via:  Teach Back   Monitoring/Evaluation:  Dietary intake, exercise, meal planning, SBG, and body weight in 3 month(s).  He may benefit from a GLP1 for needed weight loss and resume a lower dose of 70/30 at night.

## 2019-03-21 NOTE — Telephone Encounter (Signed)
Virtual Visit Pre-Appointment Phone Call  "(Name), I am calling you today to discuss your upcoming appointment. We are currently trying to limit exposure to the virus that causes COVID-19 by seeing patients at home rather than in the office."  1. "What is the BEST phone number to call the day of the visit?" - include this in appointment notes  2. Do you have or have access to (through a family member/friend) a smartphone with video capability that we can use for your visit?" a. If yes - list this number in appt notes as cell (if different from BEST phone #) and list the appointment type as a VIDEO visit in appointment notes b. If no - list the appointment type as a PHONE visit in appointment notes  3. Confirm consent - "In the setting of the current Covid19 crisis, you are scheduled for a (phone or video) visit with your provider on (date) at (time).  Just as we do with many in-office visits, in order for you to participate in this visit, we must obtain consent.  If you'd like, I can send this to your mychart (if signed up) or email for you to review.  Otherwise, I can obtain your verbal consent now.  All virtual visits are billed to your insurance company just like a normal visit would be.  By agreeing to a virtual visit, we'd like you to understand that the technology does not allow for your provider to perform an examination, and thus may limit your provider's ability to fully assess your condition. If your provider identifies any concerns that need to be evaluated in person, we will make arrangements to do so.  Finally, though the technology is pretty good, we cannot assure that it will always work on either your or our end, and in the setting of a video visit, we may have to convert it to a phone-only visit.  In either situation, we cannot ensure that we have a secure connection.  Are you willing to proceed?" STAFF: Did the patient verbally acknowledge consent to telehealth visit? Document  YES/NO here: yes  4. Advise patient to be prepared - "Two hours prior to your appointment, go ahead and check your blood pressure, pulse, oxygen saturation, and your weight (if you have the equipment to check those) and write them all down. When your visit starts, your provider will ask you for this information. If you have an Apple Watch or Kardia device, please plan to have heart rate information ready on the day of your appointment. Please have a pen and paper handy nearby the day of the visit as well."  5. Give patient instructions for MyChart download to smartphone OR [email or web address] as below if video visit (depending on what platform provider is using)  6. Inform patient they will receive a phone call 15 minutes prior to their appointment time (may be from unknown caller ID) so they should be prepared to answer    TELEPHONE CALL NOTE  [name] has been deemed a candidate for a follow-up tele-health visit to limit community exposure during the Covid-19 pandemic. I spoke with the patient via phone to ensure availability of phone/video source, confirm preferred email & phone number, and discuss instructions and expectations.  I reminded [name] to be prepared with any vital sign and/or heart rhythm information that could potentially be obtained via home monitoring, at the time of his visit. I reminded [name] to expect a phone call prior to  his visit.  Weston Anna 03/21/2019 4:35 PM   INSTRUCTIONS FOR DOWNLOADING THE MYCHART APP TO SMARTPHONE  - The patient must first make sure to have activated MyChart and know their login information - If Apple, go to [employer] and type in MyChart in the search bar and download the app. If Android, ask patient to go to [employer] and type in [place] in the search bar and download the app. The app is free but as with any other app downloads, their phone may require them to verify saved payment information or Apple/Android  password.  - The patient will need to then log into the app with their MyChart username and password, and select [facility] as their healthcare provider to link the account. When it is time for your visit, go to the MyChart app, find appointments, and click Begin Video Visit. Be sure to Select Allow for your device to access the Microphone and Camera for your visit. You will then be connected, and your provider will be with you shortly.  **If they have any issues connecting, or need assistance please contact MyChart service desk [phone])**  **If using a computer, in order to ensure the best quality for their visit they will need to use either of the following Internet Browsers: [employer], or Google Chrome**  IF USING DOXIMITY or DOXY.ME - The patient will receive a link just prior to their visit by text.     FULL LENGTH CONSENT FOR TELE-HEALTH VISIT   I hereby voluntarily request, consent and authorize [facility] and its employed or contracted physicians, physician assistants, nurse practitioners or other licensed health care professionals (the Practitioner), to provide me with telemedicine health care services (the Services") as deemed necessary by the treating Practitioner. I acknowledge and consent to receive the Services by the Practitioner via telemedicine. I understand that the telemedicine visit will involve communicating with the Practitioner through live audiovisual communication technology and the disclosure of certain medical information by electronic transmission. I acknowledge that I have been given the opportunity to request an in-person assessment or other available alternative prior to the telemedicine visit and am voluntarily participating in the telemedicine visit.  I understand that I have the right to withhold or withdraw my consent to the use of telemedicine in the course of my care at any time, without affecting my right to future care or treatment,  and that the Practitioner or I may terminate the telemedicine visit at any time. I understand that I have the right to inspect all information obtained and/or recorded in the course of the telemedicine visit and may receive copies of available information for a reasonable fee.  I understand that some of the potential risks of receiving the Services via telemedicine include:   Delay or interruption in medical evaluation due to technological equipment failure or disruption;  Information transmitted may not be sufficient (e.g. poor resolution of images) to allow for appropriate medical decision making by the Practitioner; and/or   In rare instances, security protocols could fail, causing a breach of personal health information.  Furthermore, I acknowledge that it is my responsibility to provide information about my medical history, conditions and care that is complete and accurate to the best of my ability. I acknowledge that Practitioner's advice, recommendations, and/or decision may be based on factors not within their control, such as incomplete or inaccurate data provided by me or distortions of diagnostic images or specimens that may result from electronic transmissions. I  understand that the practice of medicine is not an exact science and that Practitioner makes no warranties or guarantees regarding treatment outcomes. I acknowledge that I will receive a copy of this consent concurrently upon execution via email to the email address I last provided but may also request a printed copy by calling the office of [facility].    I understand that my insurance will be billed for this visit.   I have read or had this consent read to me.  I understand the contents of this consent, which adequately explains the benefits and risks of the Services being provided via telemedicine.   I have been provided ample opportunity to ask questions regarding this consent and the Services and have had my questions  answered to my satisfaction.  I give my informed consent for the services to be provided through the use of telemedicine in my medical care  By participating in this telemedicine visit I agree to the above.

## 2019-03-21 NOTE — Telephone Encounter (Signed)
Refill complete 

## 2019-03-26 NOTE — Progress Notes (Signed)
Virtual Visit via Telephone Note   This visit type was conducted due to national recommendations for restrictions regarding the COVID-19 Pandemic (e.g. social distancing) in an effort to limit this patient's exposure and mitigate transmission in our community.  Due to his co-morbid illnesses, this patient is at least at moderate risk for complications without adequate follow up.  This format is felt to be most appropriate for this patient at this time.  The patient did not have access to video technology/had technical difficulties with video requiring transitioning to audio format only (telephone).  All issues noted in this document were discussed and addressed.  No physical exam could be performed with this format.  Please refer to the patient's chart for his  consent to telehealth for [facility].   Date:  03/27/2019   ID:  [name], DOB [birth year], MRN [id number]  Patient Location: Home Provider Location: Home  PCP:  Alycia Rossetti, MD  Cardiologist:  Rozann Lesches, MD  Evaluation Performed:  Follow-Up Visit  Chief Complaint:   Cardiac follow-up  History of Present Illness:    [name] is a 78 y.o. male last seen in September 2019 by Ms. Dunn PA-C.  He did not have video access and we spoke by phone today.  He tells me that he has not experienced any angina symptoms or used nitroglycerin since last encounter.  He reports NYHA class II dyspnea.  I reviewed his medications which are outlined below and stable from a cardiac perspective.  He does need a refill for Lasix.  He continues to follow-up with Dr. Buelah Manis, I reviewed his last lab work.  The patient does not have symptoms concerning for COVID-19 infection (fever, chills, cough, or new shortness of breath).    Past Medical History:  Diagnosis Date  . Anemia   . C. difficile diarrhea   . CKD (chronic kidney disease), stage III ([place])   . Coronary artery disease    Status post CABG in 2007 [facility]  [place])  . 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 GERD (gastroesophageal reflux disease)   . Glaucoma   . Hyperlipidemia   . Hypertension   . Morbid obesity ([place])   . Peripheral vascular disease ([place])   . Sleep apnea   . Stroke [facility]) 2008, 2014, 2015   Past Surgical History:  Procedure Laterality Date  . CHOLECYSTECTOMY    . CORONARY ARTERY BYPASS GRAFT    . NO PAST SURGERIES    . THYROID SURGERY       Current Meds  Medication Sig  . acetaminophen (TYLENOL) 650 MG CR tablet Take 1,300 mg by mouth every 8 (eight) hours as needed for pain.  . Adalimumab 80 MG/0.8ML & 40MG /0.4ML PNKT Inject 1 Dose into the skin every 14 (fourteen) days.  [name] atorvastatin (LIPITOR) 80 MG tablet Take 80 mg by mouth daily.  . Brinzolamide-Brimonidine (SIMBRINZA) 1-0.2 % SUSP Place 1 drop into the right eye 2 (two) times daily.   . carboxymethylcellulose (REFRESH PLUS) 0.5 % SOLN Place 1 drop into both eyes 4 (four) times daily as needed.  . clotrimazole-betamethasone (LOTRISONE) cream Apply 1 application topically 2 (two) times daily. (Patient taking differently: Apply 1 application topically as needed. )  . dicyclomine (BENTYL) 20 MG tablet Take 0.5 tablets (10 mg total) by mouth 3 (three) times daily with meals as needed for spasms.  [name] dipyridamole-aspirin (AGGRENOX) 200-25 MG per 12 hr capsule Take 1  capsule by mouth 2 (two) times daily.  . folic acid (FOLVITE) 1 MG tablet Take 1 mg by mouth daily.   . furosemide (LASIX) 20 MG tablet Take 1 tablet (20 mg total) by mouth daily.  . insulin aspart protamine- aspart (NOVOLOG MIX 70/30) (70-30) 100 UNIT/ML injection Inject 0.4 mLs (40 Units total) into the skin daily with breakfast. Patient takes 40 units in the morning and 50 units at night.  . Ipratropium-Albuterol (COMBIVENT RESPIMAT) 20-100 MCG/ACT AERS respimat Inhale 1 puff into the lungs every 6 (six) hours as needed for wheezing or shortness of breath.   [name]  ketorolac (ACULAR) 0.5 % ophthalmic solution Place 2-4 drops into the right eye See admin instructions. 1 drop in the Right eye twice daily and one drop in the left eye 4 times daily  . losartan (COZAAR) 100 MG tablet Take 100 mg by mouth daily.    . metFORMIN (GLUCOPHAGE) 1000 MG tablet Take 1,000 mg by mouth 2 (two) times daily with a meal.    . methotrexate (RHEUMATREX) 2.5 MG tablet Take 20 mg by mouth once a week.  . metoprolol (LOPRESSOR) 50 MG tablet Take 0.5 tablets (25 mg total) by mouth 2 (two) times daily. FOR HEART/BLOOD PRESSURE. HOLD FOR SYSTOLIC BLOOD PRESSURE LESS THAN 1OO/HEART RATE LESS THAN 60  . pantoprazole (PROTONIX) 20 MG tablet Take 1 tablet (20 mg total) by mouth daily.  . prednisoLONE acetate (PRED FORTE) 1 % ophthalmic suspension INSTILL 1 DROP INTO LEFT EYE THREE TIMES DAILY AND TWICE DAILY IN THE RIGHT EYE  . spironolactone (ALDACTONE) 25 MG tablet Take 25 mg by mouth daily.  . tamsulosin (FLOMAX) 0.4 MG CAPS capsule Take 2 capsules (0.8 mg total) by mouth every evening.     Allergies:   Ivp dye [iodinated diagnostic agents]   Social History   Tobacco Use  . Smoking status: Former Smoker    Types: Cigarettes  . Smokeless tobacco: Never Used  Substance Use Topics  . Alcohol use: No  . Drug use: No     Family Hx: The patient's family history includes Diabetes in his brother, father, mother, and sister; Heart failure in his brother, father, mother, and sister; Hyperlipidemia in his sister; Hypertension in his brother, father, mother, and sister.  ROS:   Please see the history of present illness.    All other systems reviewed and are negative.   Prior CV studies:   The following studies were reviewed today:  Echocardiogram 06/07/2018: Study Conclusions  - Limited study to evaluate LV function. - Left ventricle: The cavity size was normal. Wall thickness was   increased in a pattern of severe LVH. Systolic function was   normal. The estimated ejection  fraction was in the range of 55%   to 60%. - Aortic valve: Mildly calcified annulus. Mildly thickened   leaflets. - Mitral valve: Mildly calcified annulus. Mildly thickened leaflets. - Technically difficult study. Echocontrast was used to enhance   visualization.  Lexiscan Myoview 07/26/2017:  There was no ST segment deviation noted during stress.  The left ventricular ejection fraction is normal (55-65%).  Findings consistent with small mild prior apical myocardial infarction with mild peri-infarct ischemia.  This is a low risk study.  Labs/Other Tests and Data Reviewed:    EKG:  An ECG dated 06/07/2018 was personally reviewed today and demonstrated:  Sinus rhythm with prolonged PR interval, low voltage, poor R wave progression rule out old anterior infarct pattern.  Recent Labs: 06/06/2018: Magnesium 2.0 08/10/2018:  ALT 13; Brain Natriuretic Peptide 167; BUN 22; Creat 1.33; Hemoglobin 10.0; Platelets 212; Potassium 4.7; Sodium 142   Recent Lipid Panel Lab Results  Component Value Date/Time   CHOL 115 10/17/2017 11:51 AM   TRIG 98 10/17/2017 11:51 AM   HDL 51 10/17/2017 11:51 AM   CHOLHDL 2.3 10/17/2017 11:51 AM   LDLCALC 46 10/17/2017 11:51 AM    Wt Readings from Last 3 Encounters:  03/27/19 280 lb (127 kg)  03/21/19 281 lb (127.5 kg)  12/06/18 278 lb (126.1 kg)     Objective:    Vital Signs:  BP 132/66   Ht 5\' 9"  (1.753 m)   Wt 280 lb (127 kg)   BMI 41.35 kg/m    He spoke in full sentences on the phone, not short of breath. No audible wheezing. Speech pattern normal.  ASSESSMENT & PLAN:    1.  Multivessel CAD status post CABG in 2007.  He does not report any progressive angina symptoms and underwent a low risk Myoview study.  Plan to continue medical therapy and observation.  2.  Essential hypertension, blood pressure control is adequate today.  No changes made to current regimen.  Keep follow-up with Dr. Buelah Manis.  3.  Mixed hyperlipidemia on Lipitor.  Last  LDL 46.  4. CKD, stage 3 with intermittent diastolic heart failure symptoms.  Weight is stable.  He continues on Lasix.  COVID-19 Education: The signs and symptoms of COVID-19 were discussed with the patient and how to seek care for testing (follow up with PCP or arrange E-visit).  The importance of social distancing was discussed today.  Time:   Today, I have spent 5 minutes with the patient with telehealth technology discussing the above problems.     Medication Adjustments/Labs and Tests Ordered: Current medicines are reviewed at length with the patient today.  Concerns regarding medicines are outlined above.   Tests Ordered: No orders of the defined types were placed in this encounter.   Medication Changes: No orders of the defined types were placed in this encounter.   Disposition:  Follow up 6 months in the [place] office.  Signed, Rozann Lesches, MD  03/27/2019 3:30 PM    [place]

## 2019-03-27 ENCOUNTER — Encounter: Payer: Self-pay | Admitting: Cardiology

## 2019-03-27 ENCOUNTER — Telehealth (INDEPENDENT_AMBULATORY_CARE_PROVIDER_SITE_OTHER): Payer: Medicare Other | Admitting: Cardiology

## 2019-03-27 VITALS — BP 132/66 | Ht 69.0 in | Wt 280.0 lb

## 2019-03-27 DIAGNOSIS — N183 Chronic kidney disease, stage 3 unspecified: Secondary | ICD-10-CM

## 2019-03-27 DIAGNOSIS — I5032 Chronic diastolic (congestive) heart failure: Secondary | ICD-10-CM | POA: Diagnosis not present

## 2019-03-27 DIAGNOSIS — I25119 Atherosclerotic heart disease of native coronary artery with unspecified angina pectoris: Secondary | ICD-10-CM | POA: Diagnosis not present

## 2019-03-27 DIAGNOSIS — I10 Essential (primary) hypertension: Secondary | ICD-10-CM | POA: Diagnosis not present

## 2019-03-27 DIAGNOSIS — Z7189 Other specified counseling: Secondary | ICD-10-CM

## 2019-03-27 DIAGNOSIS — E782 Mixed hyperlipidemia: Secondary | ICD-10-CM

## 2019-03-27 MED ORDER — FUROSEMIDE 20 MG PO TABS: 20.0000 mg | ORAL_TABLET | Freq: Every day | ORAL | 3 refills | Status: DC

## 2019-03-27 NOTE — Addendum Note (Signed)
Addended by: Barbarann Ehlers A on: 03/27/2019 03:35 PM   Modules accepted: Orders

## 2019-03-27 NOTE — Patient Instructions (Signed)
Medication Instructions: Your physician recommends that you continue on your current medications as directed. Please refer to the Current Medication list given to you today.   Labwork: None today  Procedures/Testing: None today  Follow-Up:  6 months with Dr.McDowell  Any Additional Special Instructions Will Be Listed Below (If Applicable).     If you need a refill on your cardiac medications before your next appointment, please call your pharmacy.      Thank you for choosing [facility] !

## 2019-04-24 ENCOUNTER — Telehealth: Payer: Self-pay | Admitting: Cardiology

## 2019-04-24 MED ORDER — FUROSEMIDE 20 MG PO TABS: 20.0000 mg | ORAL_TABLET | Freq: Every day | ORAL | 3 refills | Status: DC

## 2019-04-24 NOTE — Telephone Encounter (Signed)
Refill complete 

## 2019-04-24 NOTE — Telephone Encounter (Signed)
Requesting refill on Lasix sent to [facility] / tg

## 2019-05-21 ENCOUNTER — Other Ambulatory Visit: Payer: Self-pay | Admitting: Family Medicine

## 2019-05-23 ENCOUNTER — Other Ambulatory Visit: Payer: Self-pay | Admitting: Family Medicine

## 2019-06-08 ENCOUNTER — Ambulatory Visit (INDEPENDENT_AMBULATORY_CARE_PROVIDER_SITE_OTHER): Payer: Medicare Other | Admitting: Family Medicine

## 2019-06-08 ENCOUNTER — Encounter: Payer: Self-pay | Admitting: Family Medicine

## 2019-06-08 ENCOUNTER — Other Ambulatory Visit: Payer: Self-pay

## 2019-06-08 VITALS — BP 138/78 | HR 63 | Temp 98.7°F | Resp 16 | Ht 68.0 in | Wt 286.0 lb

## 2019-06-08 DIAGNOSIS — Z794 Long term (current) use of insulin: Secondary | ICD-10-CM

## 2019-06-08 DIAGNOSIS — R0609 Other forms of dyspnea: Secondary | ICD-10-CM

## 2019-06-08 DIAGNOSIS — I5032 Chronic diastolic (congestive) heart failure: Secondary | ICD-10-CM | POA: Diagnosis not present

## 2019-06-08 DIAGNOSIS — N183 Chronic kidney disease, stage 3 unspecified: Secondary | ICD-10-CM

## 2019-06-08 DIAGNOSIS — I10 Essential (primary) hypertension: Secondary | ICD-10-CM

## 2019-06-08 DIAGNOSIS — E1122 Type 2 diabetes mellitus with diabetic chronic kidney disease: Secondary | ICD-10-CM | POA: Diagnosis not present

## 2019-06-08 DIAGNOSIS — R0600 Dyspnea, unspecified: Secondary | ICD-10-CM

## 2019-06-08 NOTE — Patient Instructions (Signed)
Lasix 40mg  once a day for a week, we will call with lab results  F/u Next Tuesday for recheck

## 2019-06-08 NOTE — Assessment & Plan Note (Signed)
His weight has been increasing.  He admits to dietary indiscretions we have discussed this multiple times.  He has known heart failure often gets fluid overloaded.  Back in the fall his BNP was elevated when he has similar symptoms.  His oxygen did drop down to about 93% after walking just 2 laps.  He is on CPAP at home.  Again increase his Lasix to 40 mg once a day.  We will bring him back next week to recheck his weight and his breathing.  We will have him walk again.  We will check his renal function or another baseline.

## 2019-06-08 NOTE — Assessment & Plan Note (Signed)
Diabetes has been fairly well controlled for his age no change to insulin dose at this time.  He also follows up with the [facility] [employer].

## 2019-06-08 NOTE — Progress Notes (Signed)
Subjective:    Patient ID: [name], male    DOB: 04-Apr-1941, 78 y.o.   MRN: [id number]  Patient presents for SOB with Exertion (mainly when he has to walk )  Pt here with SOB, worse when he walks FOR MONTHS He has known heart failure  No chest pain, no cough, no URI sypmtoms, no fever  taking lasix 20mg  once a day  Had phone visit with cardiology a couple months   Taking metformin and Novolog 70/30  -  40 units in AM  50pm   CBG this AM 123 fasting   Last A1C 7.1% in Oct 2019  On MTX takes 22.5mg  once a week    Has OSA using CPAP    After 1 lap already winded ,oxygen down to 93% in ofice after 2 laps    Weight up 11lbs since last Nov, up 5 lbs since March per the chart  Review Of Systems:  GEN- denies fatigue, fever, weight loss,weakness, recent illness HEENT- denies eye drainage, change in vision, nasal discharge, CVS- denies chest pain, palpitations RESP- + SOB, cough, wheeze ABD- denies N/V, change in stools, abd pain GU- denies dysuria, hematuria, dribbling, incontinence MSK- denies joint pain, muscle aches, injury Neuro- denies headache, dizziness, syncope, seizure activity       Objective:    BP 138/78   Pulse 63   Temp 98.7 F (37.1 C) (Oral)   Resp 16   Ht 5\' 8"  (1.727 m)   Wt 286 lb (129.7 kg)   SpO2 97%   BMI 43.49 kg/m  GEN- NAD, alert and oriented x3,sitting comfortably in char  EENT- PERRL, EOMI, non injected sclera, pink conjunctiva, MMM, oropharynx clear Neck- Supple, no thyromegaly, np JVD CVS- RRR, no murmur RESP-CTAB ABD-NABS,soft,NT,ND EXT-pedal edema L >R Pulses- Radial 2+, DP- diminised+ Walks with cane        Assessment & Plan:      Problem List Items Addressed This Visit      Unprioritized   Chronic diastolic heart failure ([place])    His weight has been increasing.  He admits to dietary indiscretions we have discussed this multiple times.  He has known heart failure often gets fluid overloaded.  Back in the fall his BNP  was elevated when he has similar symptoms.  His oxygen did drop down to about 93% after walking just 2 laps.  He is on CPAP at home.  Again increase his Lasix to 40 mg once a day.  We will bring him back next week to recheck his weight and his breathing.  We will have him walk again.  We will check his renal function or another baseline.      Relevant Orders   Brain natriuretic peptide   Chronic kidney disease   DM (diabetes mellitus) type II controlled with renal manifestation ([place])    Diabetes has been fairly well controlled for his age no change to insulin dose at this time.  He also follows up with the [employer] [facility] [employer].      Relevant Orders   Hemoglobin A1c   HTN (hypertension) - Primary (Chronic)   Relevant Orders   CBC with Differential/Platelet   Comprehensive metabolic panel    Other Visit Diagnoses    DOE (dyspnea on exertion)       He does follow with cardiology recent consult.  Has chronic dyspnea is sees      Note: This dictation was prepared with Dragon dictation along with smaller phrase technology. Any  transcriptional errors that result from this process are unintentional.

## 2019-06-09 LAB — HEMOGLOBIN A1C
Hgb A1c MFr Bld: 7.6 % of total Hgb — ABNORMAL HIGH (ref <5.7)
Mean Plasma Glucose: 171 (calc)
eAG (mmol/L): 9.5 (calc)

## 2019-06-09 LAB — COMPREHENSIVE METABOLIC PANEL
AG Ratio: 1.7 (calc) (ref 1.0–2.5)
ALT: 23 U/L (ref 9–46)
AST: 14 U/L (ref 10–35)
Albumin: 4 g/dL (ref 3.6–5.1)
Alkaline phosphatase (APISO): 38 U/L (ref 35–144)
BUN/Creatinine Ratio: 13 (calc) (ref 6–22)
BUN: 21 mg/dL (ref 7–25)
CO2: 23 mmol/L (ref 20–32)
Calcium: 9.3 mg/dL (ref 8.6–10.3)
Chloride: 109 mmol/L (ref 98–110)
Creat: 1.6 mg/dL — ABNORMAL HIGH (ref 0.70–1.18)
Globulin: 2.4 g/dL (calc) (ref 1.9–3.7)
Glucose, Bld: 91 mg/dL (ref 65–99)
Potassium: 4.9 mmol/L (ref 3.5–5.3)
Sodium: 141 mmol/L (ref 135–146)
Total Bilirubin: 0.5 mg/dL (ref 0.2–1.2)
Total Protein: 6.4 g/dL (ref 6.1–8.1)

## 2019-06-09 LAB — CBC WITH DIFFERENTIAL/PLATELET
Absolute Monocytes: 810 cells/uL (ref 200–950)
Basophils Absolute: 18 cells/uL (ref 0–200)
Basophils Relative: 0.3 %
Eosinophils Absolute: 138 cells/uL (ref 15–500)
Eosinophils Relative: 2.3 %
HCT: 29.9 % — ABNORMAL LOW (ref 38.5–50.0)
Hemoglobin: 9.8 g/dL — ABNORMAL LOW (ref 13.2–17.1)
Lymphs Abs: 2022 cells/uL (ref 850–3900)
MCH: 32.2 pg (ref 27.0–33.0)
MCHC: 32.8 g/dL (ref 32.0–36.0)
MCV: 98.4 fL (ref 80.0–100.0)
MPV: 10.9 fL (ref 7.5–12.5)
Monocytes Relative: 13.5 %
Neutro Abs: 3012 cells/uL (ref 1500–7800)
Neutrophils Relative %: 50.2 %
Platelets: 178 10*3/uL (ref 140–400)
RBC: 3.04 10*6/uL — ABNORMAL LOW (ref 4.20–5.80)
RDW: 15.6 % — ABNORMAL HIGH (ref 11.0–15.0)
Total Lymphocyte: 33.7 %
WBC: 6 10*3/uL (ref 3.8–10.8)

## 2019-06-09 LAB — BRAIN NATRIURETIC PEPTIDE: Brain Natriuretic Peptide: 174 pg/mL — ABNORMAL HIGH (ref <100)

## 2019-06-11 ENCOUNTER — Other Ambulatory Visit: Payer: Self-pay

## 2019-06-12 ENCOUNTER — Ambulatory Visit (INDEPENDENT_AMBULATORY_CARE_PROVIDER_SITE_OTHER): Payer: Medicare Other | Admitting: Family Medicine

## 2019-06-12 ENCOUNTER — Encounter: Payer: Self-pay | Admitting: Family Medicine

## 2019-06-12 VITALS — BP 118/58 | HR 63 | Temp 98.9°F | Resp 18 | Ht 68.0 in | Wt 282.0 lb

## 2019-06-12 DIAGNOSIS — E1122 Type 2 diabetes mellitus with diabetic chronic kidney disease: Secondary | ICD-10-CM | POA: Diagnosis not present

## 2019-06-12 DIAGNOSIS — N183 Chronic kidney disease, stage 3 unspecified: Secondary | ICD-10-CM

## 2019-06-12 DIAGNOSIS — I5032 Chronic diastolic (congestive) heart failure: Secondary | ICD-10-CM | POA: Diagnosis not present

## 2019-06-12 DIAGNOSIS — L309 Dermatitis, unspecified: Secondary | ICD-10-CM

## 2019-06-12 DIAGNOSIS — Z794 Long term (current) use of insulin: Secondary | ICD-10-CM

## 2019-06-12 MED ORDER — FUROSEMIDE 40 MG PO TABS: 40.0000 mg | ORAL_TABLET | Freq: Every day | ORAL | 1 refills | Status: DC

## 2019-06-12 NOTE — Patient Instructions (Signed)
Continue lasix  40mg  once a day  Continue all other medication  Check your blood pressure and record and your weight We will call Friday for recheck F/U 4 weeks

## 2019-06-12 NOTE — Progress Notes (Signed)
   Subjective:    Patient ID: [name], male    DOB: September 30, 1941, 78 y.o.   MRN: [id number]  Patient presents for Follow-up  Patient here for interim follow-up on his congestive heart failure.  He presented last week with increased shortness of breath.  I increase his Lasix to 40 mg once a day.  His BNP was elevated at 174 His breathing is much better.  States his weight is down 4 pounds on his scale at home.  His labs also show chronic kidney disease his creatinine was up a little bit at 1.6  Diabetes mellitus A1c was up at 7.6%  He also has some mild chronic anemia hemoglobin was 9.8 it was 10.0 last year  I reviewed his labs at the bedside.  Also has irritated spot on inner left wrist from his watch, itches a lot   Review Of Systems:  GEN- denies fatigue, fever, weight loss,weakness, recent illness HEENT- denies eye drainage, change in vision, nasal discharge, CVS- denies chest pain, palpitations RESP- + SOB, cough, wheeze ABD- denies N/V, change in stools, abd pain GU- denies dysuria, hematuria, dribbling, incontinence MSK- denies joint pain, muscle aches, injury Neuro- denies headache, dizziness, syncope, seizure activity       Objective:    BP (!) 118/58   Pulse 63   Temp 98.9 F (37.2 C)   Resp 18   Ht 5\' 8"  (1.727 m)   Wt 282 lb (127.9 kg)   SpO2 97%   BMI 42.88 kg/m  GEN- NAD, alert and oriented x3,sitting comfortably in char  EENT- PERRL, EOMI, non injected sclera, pink conjunctiva, MMM, oropharynx clear Neck- Supple, no thyromegaly, o JVD CVS- RRR, no murmur RESP-CTAB ABD-NABS,soft,NT,ND EXT-pedal edema L >R Pulses- Radial 2+, DP- diminised+ Skin- eczematous patch inner left wrist, mild hyperpigmentation Walks with cane       Assessment & Plan:      Problem List Items Addressed This Visit      Unprioritized   Chronic diastolic heart failure ([place]) - Primary    Overall symptoms have improved with his fluid down 4 pounds.  We will continue him  on Lasix 40 mg.  I would recheck his kidney function again today.  We discussed dietary changes avoiding sodium and eating out.  Discussed the effects of this on his heart and overall ability to move about.      Relevant Medications   furosemide (LASIX) 40 MG tablet   CKD stage 3 due to type 2 diabetes mellitus ([facility])   Relevant Orders   Basic metabolic panel   DM (diabetes mellitus) type II controlled with renal manifestation ([facility])    A1c has gone up but at his age would not change his insulin.  Again he admits to eating a lot of fast food and restaurant food increasing his carbs and his blood sugars.      Relevant Orders   Basic metabolic panel    Other Visit Diagnoses    Dermatitis       Patient has some Lotrisone cream at home he can use this to the area where his watch  was irritating         Note: This dictation was prepared with Dragon dictation along with smaller phrase technology. Any transcriptional errors that result from this process are unintentional.

## 2019-06-12 NOTE — Assessment & Plan Note (Signed)
Overall symptoms have improved with his fluid down 4 pounds.  We will continue him on Lasix 40 mg.  I would recheck his kidney function again today.  We discussed dietary changes avoiding sodium and eating out.  Discussed the effects of this on his heart and overall ability to move about.

## 2019-06-12 NOTE — Assessment & Plan Note (Signed)
A1c has gone up but at his age would not change his insulin.  Again he admits to eating a lot of fast food and restaurant food increasing his carbs and his blood sugars.

## 2019-06-13 LAB — BASIC METABOLIC PANEL
BUN/Creatinine Ratio: 19 (calc) (ref 6–22)
BUN: 31 mg/dL — ABNORMAL HIGH (ref 7–25)
CO2: 22 mmol/L (ref 20–32)
Calcium: 9.7 mg/dL (ref 8.6–10.3)
Chloride: 107 mmol/L (ref 98–110)
Creat: 1.64 mg/dL — ABNORMAL HIGH (ref 0.70–1.18)
Glucose, Bld: 105 mg/dL — ABNORMAL HIGH (ref 65–99)
Potassium: 4.9 mmol/L (ref 3.5–5.3)
Sodium: 141 mmol/L (ref 135–146)

## 2019-06-17 ENCOUNTER — Encounter: Payer: Self-pay | Admitting: Family Medicine

## 2019-06-17 ENCOUNTER — Telehealth: Payer: Self-pay | Admitting: Family Medicine

## 2019-06-17 DIAGNOSIS — E119 Type 2 diabetes mellitus without complications: Secondary | ICD-10-CM | POA: Diagnosis not present

## 2019-06-17 NOTE — Telephone Encounter (Signed)
-----   Message from Vanice Sarah, [place] sent at 06/15/2019 11:26 AM EDT ----- Regarding: RE: F/U weight Spoke with pt's wife. She reports that this morning, his weight is 275 lb and he is still taking lasix. ----- Message ----- From: Alycia Rossetti, MD Sent: 06/15/2019  11:05 AM EDT To: Vanice Sarah, CMA Subject: FW: F/U weight                                  Call pt, see what weight looks like at home. He should still be on lasix     ----- Message ----- From: Alycia Rossetti, MD Sent: 06/15/2019 To: Alycia Rossetti, MD Subject: F/U weight

## 2019-07-04 DIAGNOSIS — H401133 Primary open-angle glaucoma, bilateral, severe stage: Secondary | ICD-10-CM | POA: Diagnosis not present

## 2019-07-10 ENCOUNTER — Ambulatory Visit (INDEPENDENT_AMBULATORY_CARE_PROVIDER_SITE_OTHER): Payer: Medicare Other | Admitting: Family Medicine

## 2019-07-10 ENCOUNTER — Other Ambulatory Visit: Payer: Self-pay

## 2019-07-10 ENCOUNTER — Encounter: Payer: Self-pay | Admitting: Family Medicine

## 2019-07-10 VITALS — BP 138/76 | HR 62 | Temp 98.9°F | Resp 16 | Ht 68.0 in | Wt 275.0 lb

## 2019-07-10 DIAGNOSIS — Z23 Encounter for immunization: Secondary | ICD-10-CM | POA: Diagnosis not present

## 2019-07-10 DIAGNOSIS — I10 Essential (primary) hypertension: Secondary | ICD-10-CM

## 2019-07-10 DIAGNOSIS — I5032 Chronic diastolic (congestive) heart failure: Secondary | ICD-10-CM | POA: Diagnosis not present

## 2019-07-10 NOTE — Progress Notes (Signed)
   Subjective:    Patient ID: [name], male    DOB: 07/18/1941, 78 y.o.   MRN: [id number]  Patient presents for Follow-up (is not fasting)    Pt here for interim follow-up on his heart failure.  He is currently taking Lasix 40 mg once a day.  His weight is down 7 pounds since his visit on 8/11 he is actually down 11 pounds in the past month     Last Cr 1.64 he is due for recheck on this today.  States that his breathing has been much better.  His wife had multiple questions on how much sodium he should be eating and where to look on the back of labels which we reviewed here in the office.        DM- last A1C 7.6%, This AM fasting 133,yeaterday 123 , taking insulin as prescribed             Review Of Systems:  GEN- denies fatigue, fever, weight loss,weakness, recent illness HEENT- denies eye drainage, change in vision, nasal discharge, CVS- denies chest pain, palpitations RESP- denies SOB, cough, wheeze ABD- denies N/V, change in stools, abd pain GU- denies dysuria, hematuria, dribbling, incontinence MSK- denies joint pain, muscle aches, injury Neuro- denies headache, dizziness, syncope, seizure activity       Objective:    BP 138/76   Pulse 62   Temp 98.9 F (37.2 C) (Oral)   Resp 16   Ht 5\' 8"  (1.727 m)   Wt 275 lb (124.7 kg)   SpO2 97%   BMI 41.81 kg/m  GEN- NAD, alert and oriented x3,sitting comfortably in char  EENT- PERRL, EOMI, non injected sclera, pink conjunctiva, MMM, oropharynx clear Neck- Supple, no thyromegaly, no  JVD CVS- RRR, no murmur RESP-CTAB ABD-NABS,soft,NT,ND EXT-trace pedal edema L  Pulses- Radial 2+, DP- diminised+ Walks with cane       Assessment & Plan:      Problem List Items Addressed This Visit      Unprioritized   Chronic diastolic heart failure ([place])    He has had significant weight loss secondary to his previous fluid retention.  He is doing well on the 40 mg of Lasix his breathing is much better he can move around his  home much better.  I recommend that he not eat more than 2000 mg of sodium I reviewed the DASH diet with the family also showed them how to read the back of labels.  His thing unfortunately is going to fast food restaurants and eating fried foods.  We will continue the legs at the current dose unless his renal function has been severely impacted which I will check today.  His blood pressure looks good.  Flu shot was given today      HTN (hypertension) - Primary (Chronic)   Relevant Orders   Basic metabolic panel    Other Visit Diagnoses    Need for immunization against influenza       Relevant Orders   Flu Vaccine QUAD High Dose(Fluad) (Completed)      Note: This dictation was prepared with Dragon dictation along with smaller phrase technology. Any transcriptional errors that result from this process are unintentional.

## 2019-07-10 NOTE — Assessment & Plan Note (Signed)
He has had significant weight loss secondary to his previous fluid retention.  He is doing well on the 40 mg of Lasix his breathing is much better he can move around his home much better.  I recommend that he not eat more than 2000 mg of sodium I reviewed the DASH diet with the family also showed them how to read the back of labels.  His thing unfortunately is going to fast food restaurants and eating fried foods.  We will continue the legs at the current dose unless his renal function has been severely impacted which I will check today.  His blood pressure looks good.  Flu shot was given today

## 2019-07-10 NOTE — Patient Instructions (Addendum)
No more than 2000mg  of salt  FU 3 months for Physical    DASH Eating Plan DASH stands for "Dietary Approaches to Stop Hypertension." The DASH eating plan is a healthy eating plan that has been shown to reduce high blood pressure (hypertension). It may also reduce your risk for type 2 diabetes, heart disease, and stroke. The DASH eating plan may also help with weight loss. What are tips for following this plan?  General guidelines  Avoid eating more than 2,300 mg (milligrams) of salt (sodium) a day. If you have hypertension, you may need to reduce your sodium intake to 1,500 mg a day.  Limit alcohol intake to no more than 1 drink a day for nonpregnant women and 2 drinks a day for men. One drink equals 12 oz of beer, 5 oz of wine, or 1 oz of hard liquor.  Work with your health care provider to maintain a healthy body weight or to lose weight. Ask what an ideal weight is for you.  Get at least 30 minutes of exercise that causes your heart to beat faster (aerobic exercise) most days of the week. Activities may include walking, swimming, or biking.  Work with your health care provider or diet and nutrition specialist (dietitian) to adjust your eating plan to your individual calorie needs. Reading food labels   Check food labels for the amount of sodium per serving. Choose foods with less than 5 percent of the Daily Value of sodium. Generally, foods with less than 300 mg of sodium per serving fit into this eating plan.  To find whole grains, look for the word "whole" as the first word in the ingredient list. Shopping  Buy products labeled as "low-sodium" or "no salt added."  Buy fresh foods. Avoid canned foods and premade or frozen meals. Cooking  Avoid adding salt when cooking. Use salt-free seasonings or herbs instead of table salt or sea salt. Check with your health care provider or pharmacist before using salt substitutes.  Do not fry foods. Cook foods using healthy methods such as  baking, boiling, grilling, and broiling instead.  Cook with heart-healthy oils, such as olive, canola, soybean, or sunflower oil. Meal planning  Eat a balanced diet that includes: ? 5 or more servings of fruits and vegetables each day. At each meal, try to fill half of your plate with fruits and vegetables. ? Up to 6-8 servings of whole grains each day. ? Less than 6 oz of lean meat, poultry, or fish each day. A 3-oz serving of meat is about the same size as a deck of cards. One egg equals 1 oz. ? 2 servings of low-fat dairy each day. ? A serving of nuts, seeds, or beans 5 times each week. ? Heart-healthy fats. Healthy fats called Omega-3 fatty acids are found in foods such as flaxseeds and coldwater fish, like sardines, salmon, and mackerel.  Limit how much you eat of the following: ? Canned or prepackaged foods. ? Food that is high in trans fat, such as fried foods. ? Food that is high in saturated fat, such as fatty meat. ? Sweets, desserts, sugary drinks, and other foods with added sugar. ? Full-fat dairy products.  Do not salt foods before eating.  Try to eat at least 2 vegetarian meals each week.  Eat more home-cooked food and less restaurant, buffet, and fast food.  When eating at a restaurant, ask that your food be prepared with less salt or no salt, if possible. What foods are  recommended? The items listed may not be a complete list. Talk with your dietitian about what dietary choices are best for you. Grains Whole-grain or whole-wheat bread. Whole-grain or whole-wheat pasta. Brown rice. [place]. Bulgur. Whole-grain and low-sodium cereals. Pita bread. Low-fat, low-sodium crackers. Whole-wheat flour tortillas. Vegetables Fresh or frozen vegetables (raw, steamed, roasted, or grilled). Low-sodium or reduced-sodium tomato and vegetable juice. Low-sodium or reduced-sodium tomato sauce and tomato paste. Low-sodium or reduced-sodium canned vegetables. Fruits All fresh,  dried, or frozen fruit. Canned fruit in natural juice (without added sugar). Meat and other protein foods Skinless chicken or [place]. Ground chicken or [place]. Pork with fat trimmed off. Fish and seafood. Egg whites. Dried beans, peas, or lentils. Unsalted nuts, nut butters, and seeds. Unsalted canned beans. Lean cuts of beef with fat trimmed off. Low-sodium, lean deli meat. Dairy Low-fat (1%) or fat-free (skim) milk. Fat-free, low-fat, or reduced-fat cheeses. Nonfat, low-sodium ricotta or cottage cheese. Low-fat or nonfat yogurt. Low-fat, low-sodium cheese. Fats and oils Soft margarine without trans fats. Vegetable oil. Low-fat, reduced-fat, or light mayonnaise and salad dressings (reduced-sodium). Canola, safflower, olive, soybean, and sunflower oils. Avocado. Seasoning and other foods Herbs. Spices. Seasoning mixes without salt. Unsalted popcorn and pretzels. Fat-free sweets. What foods are not recommended? The items listed may not be a complete list. Talk with your dietitian about what dietary choices are best for you. Grains Baked goods made with fat, such as croissants, muffins, or some breads. Dry pasta or rice meal packs. Vegetables Creamed or fried vegetables. Vegetables in a cheese sauce. Regular canned vegetables (not low-sodium or reduced-sodium). Regular canned tomato sauce and paste (not low-sodium or reduced-sodium). Regular tomato and vegetable juice (not low-sodium or reduced-sodium). Angie Fava. Olives. Fruits Canned fruit in a light or heavy syrup. Fried fruit. Fruit in cream or butter sauce. Meat and other protein foods Fatty cuts of meat. Ribs. Fried meat. Berniece Salines. Sausage. Bologna and other processed lunch meats. Salami. Fatback. Hotdogs. Bratwurst. Salted nuts and seeds. Canned beans with added salt. Canned or smoked fish. Whole eggs or egg yolks. Chicken or [place] with skin. Dairy Whole or 2% milk, cream, and half-and-half. Whole or full-fat cream cheese. Whole-fat or sweetened  yogurt. Full-fat cheese. Nondairy creamers. Whipped toppings. Processed cheese and cheese spreads. Fats and oils Butter. Stick margarine. Lard. Shortening. Ghee. Bacon fat. Tropical oils, such as coconut, palm kernel, or palm oil. Seasoning and other foods Salted popcorn and pretzels. Onion salt, garlic salt, seasoned salt, table salt, and sea salt. Worcestershire sauce. Tartar sauce. Barbecue sauce. Teriyaki sauce. Soy sauce, including reduced-sodium. Steak sauce. Canned and packaged gravies. Fish sauce. Oyster sauce. Cocktail sauce. Horseradish that you find on the shelf. Ketchup. Mustard. Meat flavorings and tenderizers. Bouillon cubes. Hot sauce and Tabasco sauce. Premade or packaged marinades. Premade or packaged taco seasonings. Relishes. Regular salad dressings. Where to find more information:  National Heart, Lung, and [place]: https://wilson-eaton.com/  American Heart Association: www.heart.org Summary  The DASH eating plan is a healthy eating plan that has been shown to reduce high blood pressure (hypertension). It may also reduce your risk for type 2 diabetes, heart disease, and stroke.  With the DASH eating plan, you should limit salt (sodium) intake to 2,300 mg a day. If you have hypertension, you may need to reduce your sodium intake to 1,500 mg a day.  When on the DASH eating plan, aim to eat more fresh fruits and vegetables, whole grains, lean proteins, low-fat dairy, and heart-healthy fats.  Work with your health  care provider or diet and nutrition specialist (dietitian) to adjust your eating plan to your individual calorie needs. This information is not intended to replace advice given to you by your health care provider. Make sure you discuss any questions you have with your health care provider. Document Released: 10/07/2011 Document Revised: 09/30/2017 Document Reviewed: 10/11/2016 Elsevier Patient Education  2020 [employer].

## 2019-07-11 LAB — BASIC METABOLIC PANEL
BUN/Creatinine Ratio: 16 (calc) (ref 6–22)
BUN: 26 mg/dL — ABNORMAL HIGH (ref 7–25)
CO2: 21 mmol/L (ref 20–32)
Calcium: 8.8 mg/dL (ref 8.6–10.3)
Chloride: 106 mmol/L (ref 98–110)
Creat: 1.59 mg/dL — ABNORMAL HIGH (ref 0.70–1.18)
Glucose, Bld: 199 mg/dL — ABNORMAL HIGH (ref 65–99)
Potassium: 4.4 mmol/L (ref 3.5–5.3)
Sodium: 138 mmol/L (ref 135–146)

## 2019-08-13 LAB — HM DIABETES EYE EXAM

## 2019-08-27 ENCOUNTER — Encounter: Payer: Self-pay | Admitting: *Deleted

## 2019-09-24 ENCOUNTER — Encounter: Payer: Medicare Other | Attending: Family Medicine | Admitting: Nutrition

## 2019-09-24 ENCOUNTER — Encounter: Payer: Self-pay | Admitting: Nutrition

## 2019-09-24 ENCOUNTER — Other Ambulatory Visit: Payer: Self-pay

## 2019-09-24 ENCOUNTER — Other Ambulatory Visit: Payer: Self-pay | Admitting: Family Medicine

## 2019-09-24 VITALS — Ht 68.0 in | Wt 267.8 lb

## 2019-09-24 DIAGNOSIS — E118 Type 2 diabetes mellitus with unspecified complications: Secondary | ICD-10-CM | POA: Insufficient documentation

## 2019-09-24 DIAGNOSIS — IMO0002 Reserved for concepts with insufficient information to code with codable children: Secondary | ICD-10-CM

## 2019-09-24 DIAGNOSIS — I10 Essential (primary) hypertension: Secondary | ICD-10-CM | POA: Diagnosis not present

## 2019-09-24 DIAGNOSIS — I5032 Chronic diastolic (congestive) heart failure: Secondary | ICD-10-CM | POA: Diagnosis not present

## 2019-09-24 DIAGNOSIS — E1165 Type 2 diabetes mellitus with hyperglycemia: Secondary | ICD-10-CM

## 2019-09-24 DIAGNOSIS — Z794 Long term (current) use of insulin: Secondary | ICD-10-CM

## 2019-09-24 DIAGNOSIS — N183 Chronic kidney disease, stage 3 unspecified: Secondary | ICD-10-CM

## 2019-09-24 DIAGNOSIS — E1122 Type 2 diabetes mellitus with diabetic chronic kidney disease: Secondary | ICD-10-CM

## 2019-09-24 NOTE — Progress Notes (Signed)
  Medical Nutrition Therapy:  Appt start time: 1000  end tiime 1015 Assessment:  Primary concerns today:   DM Type 2, Obesity, CHF  A1C 7.1 %. Sees Dr. Buelah Manis. Talked to MD about a month ago. No changes in medications. Down 8 lbs. Now reading food labels. Avoiding fried foods. Foods are now baked now. FBS 100-120's. Before supper 130's.  Has avoid snacking and eating more vegetables. Doing much better. Will start walking more with his walker in the house. Breathing is much better. Still taking 70/30 insulin- 40 units BID. Metformin 1000 mg BID.-Creatine is 1.59. Not sure of eGFR currently.  Lab Results  Component Value Date   HGBA1C 7.6 (H) 06/08/2019   CMP Latest Ref Rng & Units 07/10/2019 06/12/2019 06/08/2019  Glucose 65 - 99 mg/dL 199(H) 105(H) 91  BUN 7 - 25 mg/dL 26(H) 31(H) 21  Creatinine 0.70 - 1.18 mg/dL 1.59(H) 1.64(H) 1.60(H)  Sodium 135 - 146 mmol/L 138 141 141  Potassium 3.5 - 5.3 mmol/L 4.4 4.9 4.9  Chloride 98 - 110 mmol/L 106 107 109  CO2 20 - 32 mmol/L _0 Calcium 8.6 - 10.3 mg/dL 8.8 9.7 9.3  Total Protein 6.1 - 8.1 g/dL - - 6.4  Total Bilirubin 0.2 - 1.2 mg/dL - - 0.5  Alkaline Phos 38 - 126 U/L - - -  AST 10 - 35 U/L - - 14  ALT 9 - 46 U/L - - 23    No preference indicated   Learning Readiness:     Ready  Change in progress   MEDICATIONS: See list   DIETARY INTAKE:  24-hr recall:  B ( AM):  Banana , coffee or honedew melon.  Snk ( AM):   L ( PM): String beans, meatballs, roll and mac/cheese, water Snk ( PM): none D ( PM): Meat and vegetables.  Usual physical activity: walkling a little  Estimated energy needs: 1800 calories 200 g carbohydrates 135 g protein 50 g fat  Progress Towards Goal(s):  In progress.   Nutritional Diagnosis:  NB-1.1 Food and nutrition-related knowledge deficit As related to DIabetes.  As evidenced by A1C 7.2%.    Intervention:  Nutrition and Diabetes education provided on My Plate, CHO counting, meal  planning, portion sizes, timing of meals, avoiding snacks between meals unless having a low blood sugar, target ranges for A1C and blood sugars, signs/symptoms and treatment of hyper/hypoglycemia, monitoring blood sugars, taking medications as prescribed, benefits of exercising 30 minutes per day and prevention of  complications of DM.    Goals Keep up the good job!  Increase walking to 10 minutes a day twice a day. Keep drinking only water 2. Add some veggies with sandwich at lunch time and fruit, 3. Add veggies  and fruit with dinner Lose 5 lbs in the next 6 months Continue to follow low salt diet-aim for less than 200 mg per serving of anything you eat. Use Mrs. Dash  Teaching Method Utilized:  Verbal over the phone  Handouts given during visit include:    Barriers to learning/adherence to lifestyle change:  None  Demonstrated degree of understanding via:  Teach Back   Monitoring/Evaluation:  Dietary intake, exercise, meal planning, SBG, and body weight in 3 month(s).  He may benefit from Januvia and or Ozempic for weight loss and improved BS control due to elevated creatine level.[name]

## 2019-09-24 NOTE — Progress Notes (Signed)
Agree with orders

## 2019-09-24 NOTE — Patient Instructions (Signed)
Goals Keep up the good job!  Increase walking to 10 minutes a day twice a day. Keep drinking only water 2. Add some veggies with sandwich at lunch time and fruit, 3. Add veggies  and fruit with dinner Lose 5 lbs in the next 6 months Continue to follow low salt diet-aim for less than 200 mg per serving of anything you eat. Use [name]

## 2019-09-24 NOTE — Progress Notes (Unsigned)
FYI [facility] requested a new referral be placed as the one from October 2019 has expired. Patient was seen at their office today. Orders placed.

## 2019-10-23 ENCOUNTER — Encounter: Payer: Medicare Other | Admitting: Family Medicine

## 2019-10-28 ENCOUNTER — Emergency Department (HOSPITAL_COMMUNITY): Payer: No Typology Code available for payment source

## 2019-10-28 ENCOUNTER — Encounter (HOSPITAL_COMMUNITY): Payer: Self-pay

## 2019-10-28 ENCOUNTER — Emergency Department (HOSPITAL_COMMUNITY)
Admission: EM | Admit: 2019-10-28 | Discharge: 2019-10-28 | Disposition: A | Discharge status: Home / Self Care | Payer: No Typology Code available for payment source | Attending: Emergency Medicine | Admitting: Emergency Medicine

## 2019-10-28 ENCOUNTER — Other Ambulatory Visit: Payer: Self-pay

## 2019-10-28 DIAGNOSIS — Z87891 Personal history of nicotine dependence: Secondary | ICD-10-CM | POA: Diagnosis not present

## 2019-10-28 DIAGNOSIS — I259 Chronic ischemic heart disease, unspecified: Secondary | ICD-10-CM | POA: Diagnosis not present

## 2019-10-28 DIAGNOSIS — Z79899 Other long term (current) drug therapy: Secondary | ICD-10-CM | POA: Diagnosis not present

## 2019-10-28 DIAGNOSIS — N183 Chronic kidney disease, stage 3 unspecified: Secondary | ICD-10-CM | POA: Insufficient documentation

## 2019-10-28 DIAGNOSIS — E1122 Type 2 diabetes mellitus with diabetic chronic kidney disease: Secondary | ICD-10-CM | POA: Diagnosis not present

## 2019-10-28 DIAGNOSIS — Z20828 Contact with and (suspected) exposure to other viral communicable diseases: Secondary | ICD-10-CM | POA: Insufficient documentation

## 2019-10-28 DIAGNOSIS — R0602 Shortness of breath: Secondary | ICD-10-CM | POA: Diagnosis not present

## 2019-10-28 DIAGNOSIS — Z794 Long term (current) use of insulin: Secondary | ICD-10-CM | POA: Diagnosis not present

## 2019-10-28 DIAGNOSIS — I130 Hypertensive heart and chronic kidney disease with heart failure and stage 1 through stage 4 chronic kidney disease, or unspecified chronic kidney disease: Secondary | ICD-10-CM | POA: Insufficient documentation

## 2019-10-28 DIAGNOSIS — I5032 Chronic diastolic (congestive) heart failure: Secondary | ICD-10-CM | POA: Insufficient documentation

## 2019-10-28 LAB — CBC WITH DIFFERENTIAL/PLATELET
Abs Immature Granulocytes: 0.54 10*3/uL — ABNORMAL HIGH (ref 0.00–0.07)
Basophils Absolute: 0.1 10*3/uL (ref 0.0–0.1)
Basophils Relative: 1 %
Eosinophils Absolute: 0.1 10*3/uL (ref 0.0–0.5)
Eosinophils Relative: 1 %
HCT: 32.1 % — ABNORMAL LOW (ref 39.0–52.0)
Hemoglobin: 10 g/dL — ABNORMAL LOW (ref 13.0–17.0)
Immature Granulocytes: 6 %
Lymphocytes Relative: 27 %
Lymphs Abs: 2.5 10*3/uL (ref 0.7–4.0)
MCH: 33.2 pg (ref 26.0–34.0)
MCHC: 31.2 g/dL (ref 30.0–36.0)
MCV: 106.6 fL — ABNORMAL HIGH (ref 80.0–100.0)
Monocytes Absolute: 2.6 10*3/uL — ABNORMAL HIGH (ref 0.1–1.0)
Monocytes Relative: 27 %
Neutro Abs: 3.7 10*3/uL (ref 1.7–7.7)
Neutrophils Relative %: 38 %
Platelets: 579 10*3/uL — ABNORMAL HIGH (ref 150–400)
RBC: 3.01 MIL/uL — ABNORMAL LOW (ref 4.22–5.81)
RDW: 16.4 % — ABNORMAL HIGH (ref 11.5–15.5)
WBC: 9.6 10*3/uL (ref 4.0–10.5)
nRBC: 0.6 % — ABNORMAL HIGH (ref 0.0–0.2)

## 2019-10-28 LAB — BASIC METABOLIC PANEL
Anion gap: 10 (ref 5–15)
BUN: 26 mg/dL — ABNORMAL HIGH (ref 8–23)
CO2: 22 mmol/L (ref 22–32)
Calcium: 8.5 mg/dL — ABNORMAL LOW (ref 8.9–10.3)
Chloride: 105 mmol/L (ref 98–111)
Creatinine, Ser: 1.74 mg/dL — ABNORMAL HIGH (ref 0.61–1.24)
GFR calc Af Amer: 43 mL/min — ABNORMAL LOW (ref >60)
GFR calc non Af Amer: 37 mL/min — ABNORMAL LOW (ref >60)
Glucose, Bld: 276 mg/dL — ABNORMAL HIGH (ref 70–99)
Potassium: 4.4 mmol/L (ref 3.5–5.1)
Sodium: 137 mmol/L (ref 135–145)

## 2019-10-28 LAB — TROPONIN I (HIGH SENSITIVITY)
Troponin I (High Sensitivity): 5 ng/L (ref <18)
Troponin I (High Sensitivity): 5 ng/L (ref <18)

## 2019-10-28 LAB — BRAIN NATRIURETIC PEPTIDE: B Natriuretic Peptide: 66 pg/mL (ref 0.0–100.0)

## 2019-10-28 NOTE — ED Notes (Signed)
Patient's sat 97% with ambulation. Some icrease in respirations with wheezing noted after ambulation.

## 2019-10-28 NOTE — ED Triage Notes (Signed)
Pt presents to ED with complaints of increased SOB x 1 week. Pt denies fever or cough.

## 2019-10-28 NOTE — Discharge Instructions (Signed)
Your work-up today was reassuring with no signs of heart attack or significant fluid in your lungs.  Continue taking your furosemide 1 tablet twice daily for the next 3 days.  Please call your primary care provider tomorrow to schedule follow-up appointment as soon as possible.  Your Covid test will result within 24 hours.  You will receive a phone call if your test is positive, no phone call if your test is negative.  If your test is positive you will need to quarantine for at least 10 days at home.  Return to the emergency department immediately if any concerning signs or symptoms develop such as high fevers, worsening shortness of breath, chest pains, persistent vomiting, or loss of consciousness

## 2019-10-28 NOTE — ED Provider Notes (Signed)
[facility] EMERGENCY DEPARTMENT Provider Note   CSN: [id number] Arrival date & time: 10/28/19  1142     History Chief Complaint  Patient presents with  . Shortness of Breath    [name] is a 78 y.o. male with history of anemia, CKD, CAD status post CABG in 2007, diabetes mellitus, GERD, hyperlipidemia, hypertension, obesity, peripheral vascular disease, CVA, OSA presenting for evaluation of acute onset, persistent and progressively worsening shortness of breath for 1 week.  Reports he mostly notices it with exertion but also has orthopnea which is not unusual for him.  Denies chest pain, abdominal pain, nausea, vomiting, fevers, or cough.  Notes chronic left lower extremity edema secondary to vein harvesting done years ago, denies any worsening edema.  No recent travel or surgeries, no hemoptysis, no prior history of DVT or PE and he is not on any hormone replacement therapy.  He has doubled his dose of Lasix over the last 4 days without improvement.  Denies any weight gain.  No known sick contacts, no known Covid exposures.  The history is provided by the patient.       Past Medical History:  Diagnosis Date  . Anemia   . C. difficile diarrhea   . CKD (chronic kidney disease), stage III   . Coronary artery disease    Status post CABG in 2007 [facility] [place])  . Diabetes mellitus with stage 1 chronic kidney disease ([place]) 05/20/2011  . GERD (gastroesophageal reflux disease)   . Glaucoma   . Hyperlipidemia   . Hypertension   . Morbid obesity ([place])   . Peripheral vascular disease ([place])   . Sleep apnea   . Stroke [facility]) 2008, 2014, 2015    Patient Active Problem List   Diagnosis Date Noted  . CKD stage 3 due to type 2 diabetes mellitus ([place]) 06/12/2019  . Chest pain 06/07/2018  . Hypoxia   . Lactic acidosis 02/24/2018  . Dyspnea 02/20/2018  . BPH (benign prostatic hyperplasia) 02/20/2018  . DDD (degenerative disc disease), lumbar 10/17/2017  . Chronic  diastolic heart failure ([place]) 10/20/2015  . Peripheral edema 01/22/2015  . Tinea pedis 10/21/2014  . 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 Chronic kidney disease 11/21/2013  . Left leg weakness 09/06/2013  . Left-sided weakness 08/22/2013  . OSA (obstructive sleep apnea) 04/12/2013  . DM (diabetes mellitus) type II controlled with renal manifestation ([place]) 11/28/2012  . PVD (peripheral vascular disease) ([place]) 11/28/2012  . CAD (coronary artery disease) 11/28/2012  . Hyperlipidemia 11/28/2012  . Obesity, Class III, BMI 40-49.9 (morbid obesity) ([place]) 11/28/2012  . History of stroke 11/28/2012  . OAB (overactive bladder) 11/28/2012  . HTN (hypertension) 05/20/2011    Past Surgical History:  Procedure Laterality Date  . CHOLECYSTECTOMY    . CORONARY ARTERY BYPASS GRAFT    . NO PAST SURGERIES    . THYROID SURGERY         Family History  Problem Relation Age of Onset  . Diabetes Mother   . Heart failure Mother   . Hypertension Mother   . Diabetes Father   . Heart failure Father   . Hypertension Father   . Diabetes Sister   . Heart failure Sister   . Hypertension Sister   . Hyperlipidemia Sister   . Diabetes Brother   . Heart failure Brother   . Hypertension Brother     Social History   Tobacco Use  . Smoking status: Former  Smoker    Types: Cigarettes  . Smokeless tobacco: Never Used  Substance Use Topics  . Alcohol use: No  . Drug use: No    Home Medications Prior to Admission medications   Medication Sig Start Date End Date Taking? Authorizing Provider  acetaminophen (TYLENOL) 650 MG CR tablet Take 1,300 mg by mouth every 8 (eight) hours as needed for pain.   Yes [provider]  atorvastatin (LIPITOR) 80 MG tablet Take 80 mg by mouth daily.   Yes [provider]  Brinzolamide-Brimonidine (SIMBRINZA) 1-0.2 % SUSP Place 1 drop into the right eye 2 (two) times daily.    Yes [provider]   carboxymethylcellulose (REFRESH PLUS) 0.5 % SOLN Place 1 drop into both eyes 4 (four) times daily as needed.   Yes [provider]  clotrimazole-betamethasone (LOTRISONE) cream Apply 1 application topically 2 (two) times daily. Patient taking differently: Apply 1 application topically as needed.  12/02/17  Yes [place], Modena Nunnery, MD  dicyclomine (BENTYL) 20 MG tablet Take 0.5 tablets (10 mg total) by mouth 3 (three) times daily with meals as needed for spasms. 06/13/18  Yes Delsa Grana, PA-C  dipyridamole-aspirin (AGGRENOX) 200-25 MG per 12 hr capsule Take 1 capsule by mouth 2 (two) times daily.   Yes [provider]  folic acid (FOLVITE) 1 MG tablet Take 1 mg by mouth daily.  03/14/17  Yes [provider]  furosemide (LASIX) 40 MG tablet Take 1 tablet (40 mg total) by mouth daily. 06/12/19  Yes [place], Modena Nunnery, MD  insulin aspart protamine- aspart (NOVOLOG MIX 70/30) (70-30) 100 UNIT/ML injection Inject 0.4 mLs (40 Units total) into the skin daily with breakfast. Patient takes 40 units in the morning and 50 units at night. 09/25/18  Yes [place], Modena Nunnery, MD  Ipratropium-Albuterol (COMBIVENT RESPIMAT) 20-100 MCG/ACT AERS respimat Inhale 1 puff into the lungs every 6 (six) hours as needed for wheezing or shortness of breath.    Yes [provider]  ketorolac (ACULAR) 0.5 % ophthalmic solution Place 2-4 drops into the right eye See admin instructions. 1 drop in the Right eye twice daily and one drop in the left eye 4 times daily 03/10/17  Yes [provider]  losartan (COZAAR) 100 MG tablet Take 100 mg by mouth daily.     Yes [provider]  metFORMIN (GLUCOPHAGE) 1000 MG tablet Take 1,000 mg by mouth 2 (two) times daily with a meal.     Yes [provider]  methotrexate (RHEUMATREX) 2.5 MG tablet Take 20 mg by mouth once a week. 06/23/17  Yes [provider]  metoprolol (LOPRESSOR) 50 MG tablet Take 0.5 tablets (25 mg total) by mouth  2 (two) times daily. FOR HEART/BLOOD PRESSURE. HOLD FOR SYSTOLIC BLOOD PRESSURE LESS THAN 1OO/HEART RATE LESS THAN 60 05/20/11  Yes Annita Brod, MD  pantoprazole (PROTONIX) 20 MG tablet Take 1 tablet (20 mg total) by mouth daily. 06/13/18  Yes Delsa Grana, PA-C  potassium chloride (K-DUR) 10 MEQ tablet TAKE 2 TABLETS BY MOUTH ONCE DAILY WITH  FUROSEMIDE 05/24/19  Yes [place], Modena Nunnery, MD  prednisoLONE acetate (PRED FORTE) 1 % ophthalmic suspension INSTILL 1 DROP INTO LEFT EYE THREE TIMES DAILY AND TWICE DAILY IN THE RIGHT EYE 05/14/18  Yes [provider]  spironolactone (ALDACTONE) 25 MG tablet Take 25 mg by mouth daily.   Yes [provider]  tamsulosin (FLOMAX) 0.4 MG CAPS capsule Take 2 capsules (0.8 mg total) by mouth every evening.  06/09/16  Yes [place], Modena Nunnery, MD  Adalimumab 80 MG/0.8ML & 40MG /0.4ML PNKT Inject 1 Dose into the skin every 14 (fourteen) days. 09/21/17   [provider]    Allergies    Ivp dye [iodinated diagnostic agents]  Review of Systems   Review of Systems  Constitutional: Negative for chills and fever.  Respiratory: Positive for shortness of breath. Negative for cough.   Cardiovascular: Positive for leg swelling (chronic, unchanged). Negative for chest pain.  Gastrointestinal: Negative for abdominal pain, nausea and vomiting.  All other systems reviewed and are negative.   Physical Exam Updated Vital Signs BP (!) 143/61   Pulse 60   Temp 99.1 F (37.3 C) (Oral)   Resp 19   Ht 5\' 8"  (1.727 m)   Wt 120.2 kg   SpO2 99%   BMI 40.29 kg/m   Physical Exam Vitals and nursing note reviewed.  Constitutional:      General: He is not in acute distress.    Appearance: He is well-developed. He is obese.     Comments: Resting comfortably in bed.   HENT:     Head: Normocephalic and atraumatic.  Eyes:     General:        Right eye: No discharge.        Left eye: No discharge.     Conjunctiva/sclera: Conjunctivae normal.  Neck:      Vascular: No JVD.     Trachea: No tracheal deviation.  Cardiovascular:     Rate and Rhythm: Normal rate and regular rhythm.     Comments: Wearing compression stockings, no lower extremity edema appreciated. 2+ radial and DP/PT pulses bilaterally, Homans sign absent bilaterally, no palpable cords, compartments are soft  Pulmonary:     Effort: Pulmonary effort is normal.     Breath sounds: Normal breath sounds.     Comments: Mildly tachypneic, speaking in full sentence without difficulty, SPO2 saturations 98 to 100% on room air at rest Chest:     Chest wall: No tenderness.  Abdominal:     General: Abdomen is protuberant. Bowel sounds are normal. There is no distension.     Palpations: Abdomen is soft.     Tenderness: There is no abdominal tenderness. There is no guarding.  Musculoskeletal:     Right lower leg: No tenderness. No edema.     Left lower leg: No tenderness. No edema.  Skin:    General: Skin is warm and dry.     Findings: No erythema.  Neurological:     Mental Status: He is alert.  Psychiatric:        Behavior: Behavior normal.     ED Results / Procedures / Treatments   Labs (all labs ordered are listed, but only abnormal results are displayed) Labs Reviewed  BASIC METABOLIC PANEL - Abnormal; Notable for the following components:      Result Value   Glucose, Bld 276 (*)    BUN 26 (*)    Creatinine, Ser 1.74 (*)    Calcium 8.5 (*)    GFR calc non Af Amer 37 (*)    GFR calc Af Amer 43 (*)    All other components within normal limits  CBC WITH DIFFERENTIAL/PLATELET - Abnormal; Notable for the following components:   RBC 3.01 (*)    Hemoglobin 10.0 (*)    HCT 32.1 (*)    MCV 106.6 (*)    RDW 16.4 (*)    Platelets 579 (*)    nRBC 0.6 (*)  Monocytes Absolute 2.6 (*)    Abs Immature Granulocytes 0.54 (*)    All other components within normal limits  NOVEL CORONAVIRUS, NAA (HOSP ORDER, SEND-OUT TO REF LAB; TAT 18-24 HRS)  BRAIN NATRIURETIC PEPTIDE   TROPONIN I (HIGH SENSITIVITY)  TROPONIN I (HIGH SENSITIVITY)    EKG EKG Interpretation  Date/Time:  Sunday October 28 2019 12:00:45 EST Ventricular Rate:  78 PR Interval:  196 QRS Duration: 94 QT Interval:  348 QTC Calculation: 396 R Axis:   -49 Text Interpretation: Normal sinus rhythm Left axis deviation Low voltage QRS Nonspecific T wave abnormality Abnormal ECG since last tracing no significant change Confirmed by Noemi Chapel [phone]) on 10/28/2019 12:05:20 PM   Radiology DG Chest 2 View  Result Date: 10/28/2019 CLINICAL DATA:  Shortness of breath for 1 week. EXAM: CHEST - 2 VIEW COMPARISON:  June 06, 2018 FINDINGS: The mediastinal contour and cardiac silhouette are stable. There is no focal infiltrate, pulmonary edema, or pleural effusion. The osseous structures are normal. IMPRESSION: No active cardiopulmonary disease. Electronically Signed   By: Abelardo Diesel M.D.   On: 10/28/2019 12:20    Procedures Procedures (including critical care time)  Medications Ordered in ED Medications - No data to display  ED Course  I have reviewed the triage vital signs and the nursing notes.  Pertinent labs & imaging results that were available during my care of the patient were reviewed by me and considered in my medical decision making (see chart for details).    MDM Rules/Calculators/A&P                      [name] was evaluated in Emergency Department on 10/28/2019 for the symptoms described in the history of present illness. He was evaluated in the context of the global COVID-19 pandemic, which necessitated consideration that the patient might be at risk for infection with the SARS-CoV-2 virus that causes COVID-19. Institutional protocols and algorithms that pertain to the evaluation of patients at risk for COVID-19 are in a state of rapid change based on information released by regulatory bodies including the CDC and federal and state organizations. These policies and  algorithms were followed during the patient's care in the ED.  Patient presenting for evaluation of acute onset, worsening shortness of breath for 1 week.  No peripheral edema on examination.  He is afebrile, vital signs are stable.  He is nontoxic in appearance.  SPO2 saturations are stable at rest and he was ambulated with stable O2 saturations, no significant increased work of breathing.  Lab work reviewed by me shows no leukocytosis, stable anemia, stable renal insufficiency around patient's baseline.  No metabolic derangements.  Serial troponins are negative and his EKG shows no acute ischemic abnormalities.  Doubt ACS/MI at this time.  Chest x-ray shows no acute cardiopulmonary abnormalities.  No evidence of pneumonia, effusion, pneumothorax.  Doubt PE, cardiac tamponade, esophageal rupture, or dissection.  His BNP is within normal limits and he reports no significant increase in his weight so this may not be a CHF exacerbation.  On reevaluation patient is resting comfortably in no apparent distress.  He has no infectious symptoms but we will obtain an outpatient Covid test to be on the safe side.  Recommend close follow-up with his PCP for reevaluation of symptoms.  Discussed strict ED return precautions.  Patient verbalized understanding of and agreement with plan and patient stable for discharge home at this time.  Patient was seen and evaluated  by Dr. Sabra Heck who agrees with assessment and plan at this time.  Final Clinical Impression(s) / ED Diagnoses Final diagnoses:  Shortness of breath    Rx / DC Orders ED Discharge Orders    None       Debroah Baller 10/28/19 1740    Noemi Chapel, MD 10/29/19 [phone]

## 2019-10-30 LAB — NOVEL CORONAVIRUS, NAA (HOSP ORDER, SEND-OUT TO REF LAB; TAT 18-24 HRS): SARS-CoV-2, NAA: NOT DETECTED

## 2019-11-07 ENCOUNTER — Encounter: Payer: Self-pay | Admitting: Family Medicine

## 2019-11-07 ENCOUNTER — Other Ambulatory Visit: Payer: Self-pay

## 2019-11-07 ENCOUNTER — Ambulatory Visit (INDEPENDENT_AMBULATORY_CARE_PROVIDER_SITE_OTHER): Payer: Medicare Other | Admitting: Family Medicine

## 2019-11-07 VITALS — BP 140/74 | HR 86 | Temp 97.9°F | Resp 14 | Ht 68.0 in | Wt 260.0 lb

## 2019-11-07 DIAGNOSIS — E1122 Type 2 diabetes mellitus with diabetic chronic kidney disease: Secondary | ICD-10-CM | POA: Diagnosis not present

## 2019-11-07 DIAGNOSIS — I2510 Atherosclerotic heart disease of native coronary artery without angina pectoris: Secondary | ICD-10-CM | POA: Diagnosis not present

## 2019-11-07 DIAGNOSIS — I10 Essential (primary) hypertension: Secondary | ICD-10-CM

## 2019-11-07 DIAGNOSIS — Z0000 Encounter for general adult medical examination without abnormal findings: Secondary | ICD-10-CM

## 2019-11-07 DIAGNOSIS — I739 Peripheral vascular disease, unspecified: Secondary | ICD-10-CM | POA: Diagnosis not present

## 2019-11-07 DIAGNOSIS — I5032 Chronic diastolic (congestive) heart failure: Secondary | ICD-10-CM | POA: Diagnosis not present

## 2019-11-07 DIAGNOSIS — N183 Chronic kidney disease, stage 3 unspecified: Secondary | ICD-10-CM | POA: Diagnosis not present

## 2019-11-07 DIAGNOSIS — N1832 Chronic kidney disease, stage 3b: Secondary | ICD-10-CM

## 2019-11-07 DIAGNOSIS — Z794 Long term (current) use of insulin: Secondary | ICD-10-CM

## 2019-11-07 NOTE — Patient Instructions (Addendum)
Increase morning insulin to 42 units, keep  50 units at bedtime Kidney ultrasound to be done Appointment with heart doctor  F/U 4 months

## 2019-11-07 NOTE — Assessment & Plan Note (Signed)
Blood pressure is mildly elevated today his systolic is right at 881 would not change his medication today we will check his renal function again.  I am concerned about progressive kidney disease on top of his heart failure shortness of breath issues.  We will get him a follow-up appointment with his cardiologist see if there is anything else we can do with his medications to help.  He is not hypoxic he does not need oxygen at this time.  He will continue with his CPAP therapy for bedtime.  With regards to his worsening renal function I am then obtain a renal ultrasound he may need to be seen by nephrology.  I am rechecking a metabolic panel today.  We can also consider decreasing his losartan   Unclear regarding [facility] so I will [facility] to them as he already has CT scan scheduled   For his diabetes mellitus we will increase his morning insulin to 42 units continue 50 units at bedtime.  He has intentionally lost weight with dietary changes

## 2019-11-07 NOTE — Addendum Note (Signed)
Addended by: Sheral Flow on: 11/07/2019 02:01 PM   Modules accepted: Orders

## 2019-11-07 NOTE — Progress Notes (Signed)
Subjective:   Patient presents for Medicare Annual/Subsequent preventive examination.   DM- last A1C 8.2 % on December 23 I reviewed his labs from the [place] that he brought currently to be,  on 70/30   40 units in the morning 50 units , He had 1 hypoglycemic episode but otherwise his sugars have been okay.     Hyperlipidemia-  recent LDL at goal on statin drug, goal < 100 with history of stroke CAD   Diastolic heart failure/Hartness of breath.  He was seen in the emergency room a few days after Christmas secondary to increased shortness of breath.  BNP was normal chest x-ray was normal troponins negative.  He was advised to follow-up with his cardiologist.  He has been seen locally by low-power cardiology.  Most of his care however was done at the [place].  He states that the [place] was also checking on his breathing.  He is scheduled to have a CT of his chest in a few weeks they are monitoring the spot on his lungs.  He is also still on CPAP therapy for his obstructive sleep apnea which is monitored by the [place].   Review of his labs he was found to have acute on chronic anemia.  His baseline hemoglobin is 10 but he had a hemoglobin that returned at 8.8 with a mildly low iron and saturation.  He was started on iron sulfate once a day by the [place].  His CBC done at the emergency room on the 27th showed his hemoglobin back to 10.  He is only been on the iron therapy for 1 week.  He states that he did have 1 bad nosebleed does not noted any blood in the stool otherwise.  His renal function is also worsened.  His last creatinine at the [place] was 1.8 which is up from his baseline his creatinine at the emergency room was 1.7 however they did advise him to take extra low-dose Lasix for 3 days and his creatinine has not been checked again since then.    Review Past Medical/Family/Social: Per EMR   Risk Factors  Current exercise habits: None Dietary issues discussed: Yes  Cardiac risk factors: Obesity (BMI >= 30  kg/m2). HTN, DM, STroke,CAD  Depression Screen  (Note: if answer to either of the following is "Yes", a more complete depression screening is indicated)  Over the past two weeks, have you felt down, depressed or hopeless? No Over the past two weeks, have you felt little interest or pleasure in doing things? No Have you lost interest or pleasure in daily life? No Do you often feel hopeless? No Do you cry easily over simple problems? No   Activities of Daily Living  In your present state of health, do you have any difficulty performing the following activities?:  Driving? Yes  Managing money? No  Feeding yourself? No  Getting from bed to chair? No  Climbing a flight of stairs? Yes  Preparing food and eating?: No  Bathing or showering? No  Getting dressed: No  Getting to the toilet? No  Using the toilet:No  Moving around from place to place: Yes  In the past year have you fallen or had a near fall?:No  Are you sexually active? No  Do you have more than one partner? No   Hearing Difficulties: No  Do you often ask people to speak up or repeat themselves? No  Do you experience ringing or noises in your ears? No Do you have difficulty understanding  soft or whispered voices? No  Do you feel that you have a problem with memory? yes Do you often misplace items? yes Do you feel safe at home? Yes  Cognitive Testing  Alert? Yes Normal Appearance?Yes  Oriented to person? Yes Place? Yes  Time? Yes  Recall of three objects? Yes  Can perform simple calculations? Yes  Displays appropriate judgment?Yes  Can read the correct time from a watch face?Yes   List the Names of Other Physician/Practitioners you currently use:  [facility]   [facility] Dr. Manuella Ghazi Urology at [facility]  Pulmonary at [facility]- CPAP - [place] doctor at [facility]      Screening Tests / Date Colonoscopy  last 4287 - due to complications with last scope-  hypoxia, determined no longer needed                    Zostavax UTD Pneumonia- UTD Influenza Vaccine  UTD Tetanus/tdap UTD  ROS: GEN- denies fatigue, fever, weight loss,weakness, recent illness HEENT- denies eye drainage, change in vision, nasal discharge, CVS- denies chest pain, palpitations RESP- +SOB, cough, wheeze ABD- denies N/V, change in stools, abd pain GU- denies dysuria, hematuria, dribbling, incontinence MSK- denies joint pain, muscle aches, injury Neuro- denies headache, dizziness, syncope, seizure activity  PHYSICAL: Walks  with cane  ,vitals reviewed GEN- NAD, alert and oriented x3 HEENT- PERRL, EOMI, non injected sclera, pink conjunctiva, Tm clear bilat no effusion Neck supple- no KVD  CVS- RRR, no murmur RESP-CTAB ABD-NABS,soft,NT,ND Ext- Swelling L >R, CHRONIC pedal  EDEMA,  Pulses- Radial,2+ DP 1+     Assessment:    Annual wellness medicare exam   Plan:    During the course of the visit the patient was educated and counseled about appropriate screening and preventive services including:    Fall/depression/audit C screen negative  Immunizations UTD   Blood pressure is mildly elevated today his systolic is right at 681 would not change his medication today we will check his renal function again.  I am concerned about progressive kidney disease on top of his heart failure shortness of breath issues.  We will get him a follow-up appointment with his cardiologist see if there is anything else we can do with his medications to help.  He is not hypoxic he does not need oxygen at this time.     He will continue with his CPAP therapy for bedtime.  With regards to his worsening renal function I am then obtain a renal ultrasound he may need to be seen by nephrology.  I am rechecking a metabolic panel today.  We can also consider decreasing his losartan   Unclear regarding the spot on the [facility] so I will defer to them as he  already has CT scan scheduled   For his diabetes mellitus we will increase his morning insulin to 42 units continue 50 units at bedtime.  He has intentionally lost weight with dietary changes   Anemia of chronic disease-unclear cause of the acute drop.  I do not think that the iron supplement over the past week is enough to bring his hemoglobin back to baseline possible lab error.  Full Code   Diet review for nutrition referral? Yes ____ Not Indicated __x__  Patient Instructions (the written plan) was given to the patient.  Medicare Attestation  I have personally reviewed:  The patient's medical and social history  Their use of alcohol, tobacco or illicit drugs  Their current medications and supplements  The patient's functional ability including ADLs,fall risks, home safety risks, cognitive, and hearing and visual impairment  Diet and physical activities  Evidence for depression or mood disorders  The patient's weight, height, BMI, and visual acuity have been recorded in the chart. I have made referrals, counseling, and provided education to the patient based on review of the above and I have provided the patient with a written personalized care plan for preventive services.

## 2019-11-08 ENCOUNTER — Telehealth: Payer: Self-pay | Admitting: *Deleted

## 2019-11-08 LAB — BASIC METABOLIC PANEL
BUN/Creatinine Ratio: 19 (calc) (ref 6–22)
BUN: 29 mg/dL — ABNORMAL HIGH (ref 7–25)
CO2: 21 mmol/L (ref 20–32)
Calcium: 9.6 mg/dL (ref 8.6–10.3)
Chloride: 106 mmol/L (ref 98–110)
Creat: 1.55 mg/dL — ABNORMAL HIGH (ref 0.70–1.18)
Glucose, Bld: 134 mg/dL — ABNORMAL HIGH (ref 65–99)
Potassium: 4.8 mmol/L (ref 3.5–5.3)
Sodium: 138 mmol/L (ref 135–146)

## 2019-11-08 NOTE — Telephone Encounter (Signed)
-----   Message from Alycia Rossetti, MD sent at 11/07/2019  1:53 PM EST ----- Regarding: Schedule with Dr. Domenic Polite Cardiology  Patient is overdue for his follow-up with Dr. Domenic Polite  He was in the emergency room recently secondary to shortness of breath CHF please see if he can get schedule within the next 3-4 weeks

## 2019-11-08 NOTE — Telephone Encounter (Signed)
Laced to cards and appointment scheduled for 11/13/2019 @ 1pm.   Call placed to patient and patient wife Vaughan Basta made aware.

## 2019-11-12 ENCOUNTER — Other Ambulatory Visit: Payer: Self-pay

## 2019-11-12 ENCOUNTER — Encounter: Payer: Self-pay | Admitting: Family Medicine

## 2019-11-12 ENCOUNTER — Ambulatory Visit (INDEPENDENT_AMBULATORY_CARE_PROVIDER_SITE_OTHER): Payer: Medicare Other | Admitting: Family Medicine

## 2019-11-12 VITALS — BP 138/68 | HR 70 | Temp 98.2°F | Resp 14 | Ht 68.0 in | Wt 271.0 lb

## 2019-11-12 DIAGNOSIS — I5032 Chronic diastolic (congestive) heart failure: Secondary | ICD-10-CM | POA: Diagnosis not present

## 2019-11-12 DIAGNOSIS — R8290 Unspecified abnormal findings in urine: Secondary | ICD-10-CM | POA: Diagnosis not present

## 2019-11-12 LAB — URINALYSIS, ROUTINE W REFLEX MICROSCOPIC
Bilirubin Urine: NEGATIVE
Glucose, UA: NEGATIVE
Hgb urine dipstick: NEGATIVE
Ketones, ur: NEGATIVE
Leukocytes,Ua: NEGATIVE
Nitrite: NEGATIVE
Protein, ur: NEGATIVE
Specific Gravity, Urine: 1.02 (ref 1.001–1.03)
pH: 5 (ref 5.0–8.0)

## 2019-11-12 NOTE — Progress Notes (Signed)
Cardiology Office Note  Date: 11/13/2019   ID: ARMEND HOCHSTATTER, DOB [birth year], MRN [id number]  PCP:  Alycia Rossetti, MD  Cardiologist:  Rozann Lesches, MD Electrophysiologist:  None   Chief Complaint  Patient presents with  . Cardiac follow-up    History of Present Illness: [name] is a 79 y.o. male last assessed via telehealth encounter in May.  He is here with his wife today for a follow-up visit.  Records indicate recent ER visit in late December 2020 reporting worsening shortness of breath with persistent leg edema as before on diuretics and with compression stockings.  Chest x-ray showed no acute findings and he ruled out for ACS via high-sensitivity troponin I levels.  SARS coronavirus 2 test was negative.  He has had 2 recent visits with his PCP.  He reports no recent worsening in shortness of breath, no cough.  He does have dyspnea on exertion at NYHA class II-III, leg swelling has been reasonably well controlled, left greater than right following prior vein harvest, he uses compression socks.  Weight got down to 260 but is creeping back up.  He does not describe any active angina or nitroglycerin use.  Today we went over his medications.  We discussed changing her Lasix to Demadex 40 mg daily.  Also stopping Aggrenox in favor of Plavix.  He does not report any bleeding problems.  He does have anemia and has been on iron supplements to the [facility].  I reviewed his recent lab work.  Past Medical History:  Diagnosis Date  . Anemia   . C. difficile diarrhea   . CKD (chronic kidney disease), stage III   . Coronary artery disease    Status post CABG in 2007 [facility] [place])  . 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 GERD (gastroesophageal reflux disease)   . Glaucoma   . Hyperlipidemia   . Hypertension   . Morbid obesity ([place])   . Peripheral vascular disease ([place])   . Sleep apnea   . Stroke [facility]) 2008,  2014, 2015    Past Surgical History:  Procedure Laterality Date  . CHOLECYSTECTOMY    . CORONARY ARTERY BYPASS GRAFT    . NO PAST SURGERIES    . THYROID SURGERY      Current Outpatient Medications  Medication Sig Dispense Refill  . acetaminophen (TYLENOL) 650 MG CR tablet Take 1,300 mg by mouth every 8 (eight) hours as needed for pain.    [name] atorvastatin (LIPITOR) 80 MG tablet Take 80 mg by mouth daily.    . clotrimazole-betamethasone (LOTRISONE) cream Apply 1 application topically 2 (two) times daily. (Patient taking differently: Apply 1 application topically as needed. ) 30 g 1  . dicyclomine (BENTYL) 20 MG tablet Take 0.5 tablets (10 mg total) by mouth 3 (three) times daily with meals as needed for spasms. 20 tablet 0  . ferrous sulfate 325 (65 FE) MG tablet Take 325 mg by mouth daily with breakfast.    . folic acid (FOLVITE) 1 MG tablet Take 1 mg by mouth daily.     . insulin aspart protamine- aspart (NOVOLOG MIX 70/30) (70-30) 100 UNIT/ML injection Inject 0.4 mLs (40 Units total) into the skin daily with breakfast. Patient takes 40 units in the morning and 50 units at night. 10 mL 0  . Ipratropium-Albuterol (COMBIVENT RESPIMAT) 20-100 MCG/ACT AERS respimat Inhale 1 puff into the lungs every 6 (six) hours as needed  for wheezing or shortness of breath.     . losartan (COZAAR) 100 MG tablet Take 100 mg by mouth daily.      . metFORMIN (GLUCOPHAGE) 1000 MG tablet Take 1,000 mg by mouth 2 (two) times daily with a meal.      . methotrexate (RHEUMATREX) 2.5 MG tablet Take 20 mg by mouth once a week.    . metoprolol (LOPRESSOR) 50 MG tablet Take 0.5 tablets (25 mg total) by mouth 2 (two) times daily. FOR HEART/BLOOD PRESSURE. HOLD FOR SYSTOLIC BLOOD PRESSURE LESS THAN 1OO/HEART RATE LESS THAN 60    . pantoprazole (PROTONIX) 20 MG tablet Take 1 tablet (20 mg total) by mouth daily. 30 tablet 0  . potassium chloride (K-DUR) 10 MEQ tablet TAKE 2 TABLETS BY MOUTH ONCE DAILY WITH  FUROSEMIDE 180  tablet 1  . prednisoLONE acetate (PRED FORTE) 1 % ophthalmic suspension INSTILL 1 DROP INTO LEFT EYE THREE TIMES DAILY AND TWICE DAILY IN THE RIGHT EYE  11  . spironolactone (ALDACTONE) 25 MG tablet Take 25 mg by mouth daily.    . tamsulosin (FLOMAX) 0.4 MG CAPS capsule Take 2 capsules (0.8 mg total) by mouth every evening. 60 capsule 3  . clopidogrel (PLAVIX) 75 MG tablet Take 1 tablet (75 mg total) by mouth daily. 90 tablet 3  . torsemide (DEMADEX) 20 MG tablet Take 2 tablets (40 mg total) by mouth daily. 180 tablet 3   No current facility-administered medications for this visit.   Allergies:  Ivp dye [iodinated diagnostic agents]   Social History: The patient  reports that he has quit smoking. His smoking use included cigarettes. He has never used smokeless tobacco. He reports that he does not drink alcohol or use drugs.   ROS:  Please see the history of present illness. Otherwise, complete review of systems is positive for none.  All other systems are reviewed and negative.   Physical Exam: VS:  BP (!) 143/62   Pulse 68   Temp 97.8 F (36.6 C)   Ht 5\' 8"  (1.727 m)   Wt 270 lb (122.5 kg)   SpO2 97%   BMI 41.05 kg/m , BMI Body mass index is 41.05 kg/m.  Wt Readings from Last 3 Encounters:  11/13/19 270 lb (122.5 kg)  11/12/19 271 lb (122.9 kg)  11/07/19 260 lb (117.9 kg)    General: Obese male, appears comfortable at rest.  Using a walker. HEENT: Conjunctiva and lids normal, wearing a mask. Neck: Supple, no carotid bruits, no thyromegaly. Lungs: Decreased breath sounds without crackles, nonlabored breathing at rest. Cardiac: Regular rate and rhythm, no S3 or significant systolic murmur. Abdomen: Protuberant, nontender, bowel sounds present. Extremities: Mild lower leg edema, left greater than right, distal pulses 1-2+. Skin: Warm and dry. Musculoskeletal: No kyphosis. Neuropsychiatric: Alert and oriented x3, affect grossly appropriate.  ECG:  An ECG dated 10/28/2019 was  personally reviewed today and demonstrated:  Sinus rhythm with leftward axis and low voltage, nonspecific T wave changes.  Recent Labwork: 06/08/2019: ALT 23; AST 14 10/28/2019: B Natriuretic Peptide 66.0; Hemoglobin 10.0; Platelets 579 11/07/2019: BUN 29; Creat 1.55; Potassium 4.8; Sodium 138     Component Value Date/Time   CHOL 115 10/17/2017 1151   TRIG 98 10/17/2017 1151   HDL 51 10/17/2017 1151   CHOLHDL 2.3 10/17/2017 1151   VLDL 18 10/11/2016 1042   LDLCALC 46 10/17/2017 1151    Other Studies Reviewed Today:  Echocardiogram 06/07/2018: Study Conclusions  - Limited study to evaluate LV  function. - Left ventricle: The cavity size was normal. Wall thickness was   increased in a pattern of severe LVH. Systolic function was   normal. The estimated ejection fraction was in the range of 55%   to 60%. - Aortic valve: Mildly calcified annulus. Mildly thickened   leaflets. - Mitral valve: Mildly calcified annulus. Mildly thickened leaflets. - Technically difficult study. Echocontrast was used to enhance   visualization.  Chest x-ray 10/28/2019: FINDINGS: The mediastinal contour and cardiac silhouette are stable. There is no focal infiltrate, pulmonary edema, or pleural effusion. The osseous structures are normal.  IMPRESSION: No active cardiopulmonary disease.  Assessment and Plan:  1.  Chronic diastolic heart failure.  Shortness of breath is likely multifactorial, but his weight is up and we discussed changing from Lasix to Demadex 40 mg daily, otherwise continue potassium supplements.  Continue Aldactone.  Check BMET in 7 to 10 days.  2.  Multivessel CAD status post CABG in 2007.  No active angina at this time. Recommended that he get a refill for fresh bottle of nitroglycerin.  We are taking him off Aggrenox and putting him on Plavix 75 mg daily.  Continue Lopressor, losartan, and Lipitor.  3.  CKD stage III, last creatinine 1.55 with normal potassium.  4.  Mixed  hyperlipidemia, he continues on Lipitor.  Last LDL 46.  Medication Adjustments/Labs and Tests Ordered: Current medicines are reviewed at length with the patient today.  Concerns regarding medicines are outlined above.   Tests Ordered: Orders Placed This Encounter  Procedures  . Basic Metabolic Panel (BMET)    Medication Changes: Meds ordered this encounter  Medications  . clopidogrel (PLAVIX) 75 MG tablet    Sig: Take 1 tablet (75 mg total) by mouth daily.    Dispense:  90 tablet    Refill:  3  . torsemide (DEMADEX) 20 MG tablet    Sig: Take 2 tablets (40 mg total) by mouth daily.    Dispense:  180 tablet    Refill:  3    Disposition:  Follow up 3 months in the [place] office.  Signed, Satira Sark, MD, [facility] 11/13/2019 1:15 PM    [facility] at [facility] [address] [place], [place] [address] Phone: [phone]; Fax: [phone]

## 2019-11-12 NOTE — Assessment & Plan Note (Signed)
Weight is up, but I query if truly 10lbs. Only minimal change in symptoms He has cardiology appt tomorrow Will have him take an extra 20mg  of lasix this evening  FOr the odor to urine, UA is clear, will send for culture, no UTI symptoms, possible medications with some concentration of the urine is causing the odor

## 2019-11-12 NOTE — Progress Notes (Signed)
   Subjective:    Patient ID: [name], male    DOB: 29-Sep-1941, 79 y.o.   MRN: [id number]  Patient presents for Malodorous Urine (x2 weeks- strong odor to urine per patient wife)  Patient here with his wife.  For the past couple weeks he noted a strong odor to his urine.  He states that he did not notice it very much.  The only new medication is the addition of the iron recently.  He denies any abdominal pain associated no burning no urinary frequency no dribbling no incontinence.  Also continues to have episodes of shortness of breath.  He is taking his medications as prescribed.  He has appointment with cardiology tomorrow.  States that his weights have been fairly consistent at home.  His weight at our last visit was 260 pounds today 271 pounds.   Review Of Systems:  GEN- denies fatigue, fever, weight loss,weakness, recent illness HEENT- denies eye drainage, change in vision, nasal discharge, CVS- denies chest pain, palpitations RESP- +  SOB, cough, wheeze ABD- denies N/V, change in stools, abd pain GU- denies dysuria, hematuria, dribbling, incontinence MSK- denies joint pain, muscle aches, injury Neuro- denies headache, dizziness, syncope, seizure activity       Objective:    BP 138/68   Pulse 70   Temp 98.2 F (36.8 C) (Temporal)   Resp 14   Ht 5\' 8"  (1.727 m)   Wt 271 lb (122.9 kg)   SpO2 94%   BMI 41.21 kg/m  GEN- NAD, alert and oriented x3 Neck supple- no JVD  CVS- RRR, no murmur RESP-CTAB ABD-NABS,soft,NT,ND, no CVA tenderness  Ext- Swelling L >R, CHRONIC pedal  EDEMA,  Pulses- Radial,2+ DP 1+        Assessment & Plan:      Problem List Items Addressed This Visit      Unprioritized   Chronic diastolic heart failure (HCC)    Weight is up, but I query if truly 10lbs. Only minimal change in symptoms He has cardiology appt tomorrow Will have him take an extra 20mg  of lasix this evening  FOr the odor to urine, UA is clear, will send for culture, no  UTI symptoms, possible medications with some concentration of the urine is causing the odor        Other Visit Diagnoses    Malodorous urine    -  Primary   Relevant Orders   Urinalysis, Routine w reflex microscopic (Completed)   Urine Culture      Note: This dictation was prepared with Dragon dictation along with smaller phrase technology. Any transcriptional errors that result from this process are unintentional.

## 2019-11-12 NOTE — Patient Instructions (Addendum)
F/U as previous  Take an extra 1/2 tablet of water pill today

## 2019-11-13 ENCOUNTER — Ambulatory Visit (INDEPENDENT_AMBULATORY_CARE_PROVIDER_SITE_OTHER): Payer: No Typology Code available for payment source | Admitting: Cardiology

## 2019-11-13 ENCOUNTER — Telehealth: Payer: Self-pay | Admitting: *Deleted

## 2019-11-13 ENCOUNTER — Encounter: Payer: Self-pay | Admitting: Cardiology

## 2019-11-13 VITALS — BP 143/62 | HR 68 | Temp 97.8°F | Ht 68.0 in | Wt 270.0 lb

## 2019-11-13 DIAGNOSIS — G4733 Obstructive sleep apnea (adult) (pediatric): Secondary | ICD-10-CM

## 2019-11-13 DIAGNOSIS — I10 Essential (primary) hypertension: Secondary | ICD-10-CM | POA: Diagnosis not present

## 2019-11-13 DIAGNOSIS — I25119 Atherosclerotic heart disease of native coronary artery with unspecified angina pectoris: Secondary | ICD-10-CM | POA: Diagnosis not present

## 2019-11-13 DIAGNOSIS — N1832 Chronic kidney disease, stage 3b: Secondary | ICD-10-CM | POA: Diagnosis not present

## 2019-11-13 DIAGNOSIS — I5032 Chronic diastolic (congestive) heart failure: Secondary | ICD-10-CM | POA: Diagnosis not present

## 2019-11-13 DIAGNOSIS — Z9989 Dependence on other enabling machines and devices: Secondary | ICD-10-CM

## 2019-11-13 MED ORDER — TORSEMIDE 20 MG PO TABS: 40.0000 mg | ORAL_TABLET | Freq: Every day | ORAL | 3 refills | Status: DC

## 2019-11-13 MED ORDER — CLOPIDOGREL BISULFATE 75 MG PO TABS: 75.0000 mg | ORAL_TABLET | Freq: Every day | ORAL | 3 refills | Status: DC

## 2019-11-13 NOTE — Telephone Encounter (Signed)
Sounds, good, no changes otherwise

## 2019-11-13 NOTE — Patient Instructions (Addendum)
Medication Instructions:  STOP Aggrenox   STOP Lasix    START Plaxix 75 mg daily   START Demadex 40 mg daily   *If you need a refill on your cardiac medications before your next appointment, please call your pharmacy*  Lab Work: BMET in 7-10 days If you have labs (blood work) drawn today and your tests are completely normal, you will receive your results only by: [name] MyChart Message (if you have MyChart) OR . A paper copy in the mail If you have any lab test that is abnormal or we need to change your treatment, we will call you to review the results.  Testing/Procedures: NONE  Follow-Up: At [facility], you and your health needs are our priority.  As part of our continuing mission to provide you with exceptional heart care, we have created designated Provider Care Teams.  These Care Teams include your primary Cardiologist (physician) and Advanced Practice Providers (APPs -  Physician Assistants and Nurse Practitioners) who all work together to provide you with the care you need, when you need it.  Your next appointment:   3 month(s)  The format for your next appointment:   In Person  Provider:   Rozann Lesches, MD  Other Instructions NONE     Thank you for choosing [facility] !

## 2019-11-13 NOTE — Telephone Encounter (Signed)
Received call from patient wife Vaughan Basta.   Reports that patient weight this AM 264lbs.   States that she added 1/2 tab of fluid pill x3 days.   MD please advise.

## 2019-11-13 NOTE — Telephone Encounter (Signed)
Call placed to patient and patient wife Vaughan Basta made aware.  Of note, patient has appointment with cardiologist at [facility] today.

## 2019-11-14 LAB — URINE CULTURE
MICRO NUMBER:: 10028774
Result:: NO GROWTH
SPECIMEN QUALITY:: ADEQUATE

## 2019-11-15 ENCOUNTER — Other Ambulatory Visit: Payer: Self-pay

## 2019-11-15 ENCOUNTER — Ambulatory Visit (HOSPITAL_COMMUNITY)
Admission: RE | Admit: 2019-11-15 | Discharge: 2019-11-15 | Disposition: A | Discharge status: Home / Self Care | Payer: Medicare Other | Source: Ambulatory Visit | Attending: Family Medicine | Admitting: Family Medicine

## 2019-11-15 DIAGNOSIS — N183 Chronic kidney disease, stage 3 unspecified: Secondary | ICD-10-CM | POA: Insufficient documentation

## 2019-11-15 DIAGNOSIS — E1122 Type 2 diabetes mellitus with diabetic chronic kidney disease: Secondary | ICD-10-CM | POA: Diagnosis not present

## 2019-11-16 ENCOUNTER — Telehealth: Payer: Self-pay | Admitting: Cardiology

## 2019-11-16 NOTE — Telephone Encounter (Signed)
[facility] doctor does not want him coming off medication Aggrenox

## 2019-11-16 NOTE — Telephone Encounter (Signed)
Will forward to Dr. McDowell as an FYI.  

## 2019-11-17 NOTE — Telephone Encounter (Signed)
If they feel strongly about it, that is fine.  Actually, there is similar stroke reduction efficacy comparing Plavix and Aggrenox, and more compelling data to suggest efficacy of Plavix with his other comorbidities such as CAD and particularly PAD.  Please make sure that his [facility] provider is the name listed on Aggrenox and that they fill this prescription rather than our office.

## 2019-11-20 NOTE — Telephone Encounter (Signed)
Pt informed. He will stay on Aggrenox.[facility]

## 2019-11-23 ENCOUNTER — Telehealth: Payer: Self-pay | Admitting: Family Medicine

## 2019-11-23 MED ORDER — POTASSIUM CHLORIDE CRYS ER 10 MEQ PO TBCR: EXTENDED_RELEASE_TABLET | ORAL | 1 refills | Status: DC

## 2019-11-23 NOTE — Telephone Encounter (Signed)
Patient called in requesting refills on Potassium. Refills sent to pharmacy

## 2019-12-31 ENCOUNTER — Other Ambulatory Visit: Payer: Self-pay | Admitting: *Deleted

## 2019-12-31 MED ORDER — POTASSIUM CHLORIDE CRYS ER 10 MEQ PO TBCR: EXTENDED_RELEASE_TABLET | ORAL | 1 refills | Status: DC

## 2020-02-08 ENCOUNTER — Ambulatory Visit (INDEPENDENT_AMBULATORY_CARE_PROVIDER_SITE_OTHER): Payer: Medicare Other | Admitting: Cardiology

## 2020-02-08 ENCOUNTER — Encounter: Payer: Self-pay | Admitting: Cardiology

## 2020-02-08 VITALS — BP 122/62 | HR 68 | Temp 96.1°F | Ht 68.0 in | Wt 273.0 lb

## 2020-02-08 DIAGNOSIS — N1832 Chronic kidney disease, stage 3b: Secondary | ICD-10-CM | POA: Diagnosis not present

## 2020-02-08 DIAGNOSIS — I5032 Chronic diastolic (congestive) heart failure: Secondary | ICD-10-CM

## 2020-02-08 DIAGNOSIS — I25119 Atherosclerotic heart disease of native coronary artery with unspecified angina pectoris: Secondary | ICD-10-CM

## 2020-02-08 NOTE — Progress Notes (Signed)
Cardiology Office Note  Date: 02/08/2020   ID: [name], DOB Jan 02, 1941, MRN [id number]  PCP:  Charles Rossetti, MD  Cardiologist:  Charles Lesches, MD Electrophysiologist:  None   Chief Complaint  Patient presents with  . Cardiac follow-up    History of Present Illness: [name] is a 79 y.o. male last seen in January.  He is here today with his wife for a follow-up visit.  At the last visit we switched from Lasix to [facility].  He states that this has been more effective, leg swelling improved, he continues to use compression stockings. Recent creatinine 1.55 down from 1.74.  I reviewed the remainder of his cardiac regimen which is outlined below.  [facility] did not want to switch him from Aggrenox to Plavix which I suggested at the last visit.  He and his wife have both had the coronavirus vaccine.  Past Medical History:  Diagnosis Date  . Anemia   . C. difficile diarrhea   . CKD (chronic kidney disease), stage III   . Coronary artery disease    Status post CABG in 2007 [facility] [place])  . 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 GERD (gastroesophageal reflux disease)   . Glaucoma   . Hyperlipidemia   . Hypertension   . Morbid obesity ([place])   . Peripheral vascular disease ([place])   . Sleep apnea   . Stroke [facility]) 2008, 2014, 2015    Past Surgical History:  Procedure Laterality Date  . CHOLECYSTECTOMY    . CORONARY ARTERY BYPASS GRAFT    . NO PAST SURGERIES    . THYROID SURGERY      Current Outpatient Medications  Medication Sig Dispense Refill  . acetaminophen (TYLENOL) 650 MG CR tablet Take 1,300 mg by mouth every 8 (eight) hours as needed for pain.    [name] atorvastatin (LIPITOR) 80 MG tablet Take 80 mg by mouth daily.    . clotrimazole-betamethasone (LOTRISONE) cream Apply 1 application topically 2 (two) times daily. (Patient taking differently: Apply 1 application topically as needed. ) 30 g 1  .  dicyclomine (BENTYL) 20 MG tablet Take 0.5 tablets (10 mg total) by mouth 3 (three) times daily with meals as needed for spasms. 20 tablet 0  . ferrous sulfate 325 (65 FE) MG tablet Take 325 mg by mouth daily with breakfast.    . folic acid (FOLVITE) 1 MG tablet Take 1 mg by mouth daily.     . insulin aspart protamine- aspart (NOVOLOG MIX 70/30) (70-30) 100 UNIT/ML injection Inject 0.4 mLs (40 Units total) into the skin daily with breakfast. Patient takes 40 units in the morning and 50 units at night. 10 mL 0  . Ipratropium-Albuterol (COMBIVENT RESPIMAT) 20-100 MCG/ACT AERS respimat Inhale 1 puff into the lungs every 6 (six) hours as needed for wheezing or shortness of breath.     . losartan (COZAAR) 100 MG tablet Take 100 mg by mouth daily.      . metFORMIN (GLUCOPHAGE) 1000 MG tablet Take 1,000 mg by mouth 2 (two) times daily with a meal.      . methotrexate (RHEUMATREX) 2.5 MG tablet Take 20 mg by mouth once a week.    . metoprolol (LOPRESSOR) 50 MG tablet Take 0.5 tablets (25 mg total) by mouth 2 (two) times daily. FOR HEART/BLOOD PRESSURE. HOLD FOR SYSTOLIC BLOOD PRESSURE LESS THAN 1OO/HEART RATE LESS THAN 60    . pantoprazole (PROTONIX) 20  MG tablet Take 1 tablet (20 mg total) by mouth daily. 30 tablet 0  . potassium chloride (KLOR-CON) 10 MEQ tablet TAKE 2 TABLETS BY MOUTH ONCE DAILY WITH  FUROSEMIDE 180 tablet 1  . prednisoLONE acetate (PRED FORTE) 1 % ophthalmic suspension INSTILL 1 DROP INTO LEFT EYE THREE TIMES DAILY AND TWICE DAILY IN THE RIGHT EYE  11  . spironolactone (ALDACTONE) 25 MG tablet Take 25 mg by mouth daily.    . tamsulosin (FLOMAX) 0.4 MG CAPS capsule Take 2 capsules (0.8 mg total) by mouth every evening. 60 capsule 3  . torsemide (DEMADEX) 20 MG tablet Take 2 tablets (40 mg total) by mouth daily. 180 tablet 3   No current facility-administered medications for this visit.   Allergies:  Ivp dye [iodinated diagnostic agents]   ROS:  No palpitations or syncope.  Physical  Exam: VS:  BP 122/62   Pulse 68   Temp (!) 96.1 F (35.6 C)   Ht 5\' 8"  (1.727 m)   Wt 273 lb (123.8 kg)   SpO2 97%   BMI 41.51 kg/m , BMI Body mass index is 41.51 kg/m.  Wt Readings from Last 3 Encounters:  02/08/20 273 lb (123.8 kg)  11/13/19 270 lb (122.5 kg)  11/12/19 271 lb (122.9 kg)    General: Elderly male, patient appears comfortable at rest.  Using a walker. HEENT: Conjunctiva and lids normal, wearing a mask. Neck: Supple, no elevated JVP or carotid bruits, no thyromegaly. Lungs: Clear to auscultation, nonlabored breathing at rest. Cardiac: Regular rate and rhythm, no S3 or significant systolic murmur, no pericardial rub. Abdomen: Soft, nontender, bowel sounds present. Extremities: Compression stockings in place with trace leg edema.  ECG:  An ECG dated 10/28/2019 was personally reviewed today and demonstrated:  Sinus rhythm with leftward axis and low voltage, nonspecific T wave changes.  Recent Labwork: 06/08/2019: ALT 23; AST 14 10/28/2019: B Natriuretic Peptide 66.0; Hemoglobin 10.0; Platelets 579 11/07/2019: BUN 29; Creat 1.55; Potassium 4.8; Sodium 138     Component Value Date/Time   CHOL 115 10/17/2017 1151   TRIG 98 10/17/2017 1151   HDL 51 10/17/2017 1151   CHOLHDL 2.3 10/17/2017 1151   VLDL 18 10/11/2016 1042   LDLCALC 46 10/17/2017 1151    Other Studies Reviewed Today:  Echocardiogram 06/07/2018: Study Conclusions  - Limited study to evaluate LV function. - Left ventricle: The cavity size was normal. Wall thickness was increased in a pattern of severe LVH. Systolic function was normal. The estimated ejection fraction was in the range of 55% to 60%. - Aortic valve: Mildly calcified annulus. Mildly thickened leaflets. - Mitral valve: Mildly calcified annulus. Mildly thickened leaflets. - Technically difficult study. Echocontrast was used to enhance visualization.  Chest x-ray 10/28/2019: FINDINGS: The mediastinal contour and cardiac  silhouette are stable. There is no focal infiltrate, pulmonary edema, or pleural effusion. The osseous structures are normal.  IMPRESSION: No active cardiopulmonary disease.  Assessment and Plan:  1.  Chronic diastolic heart failure.  He states that he has been less short of breath and leg swelling is better in general.  We will continue Demadex in lieu of Lasix.  Also on Cozaar, Lopressor, and Aldactone. Follow-up BMET for next visit.  2.  Multivessel CAD status post CABG in 2007.  No active angina at this time on medical therapy.  He was kept on Aggrenox by [facility], otherwise continue beta-blocker, ARB, and statin.  3.  CKD stage IIIb.  Medication Adjustments/Labs and Tests Ordered:  Current medicines are reviewed at length with the patient today.  Concerns regarding medicines are outlined above.   Tests Ordered: Orders Placed This Encounter  Procedures  . Basic Metabolic Panel (BMET)    Medication Changes: No orders of the defined types were placed in this encounter.   Disposition:  Follow up 6 months in the [place] office.  Signed, Satira Sark, MD, [facility] 02/08/2020 1:50 PM    [facility] at [facility] [address] [place], [place] [address] Phone: [phone]; Fax: [phone]

## 2020-02-08 NOTE — Patient Instructions (Signed)
Medication Instructions:  Your physician recommends that you continue on your current medications as directed. Please refer to the Current Medication list given to you today.   Labwork: JUST BEFORE 6 MONTH VISIT  BMET   Testing/Procedures: NONE  Follow-Up: Your physician wants you to follow-up in: 6 MONTHS.  You will receive a reminder letter in the mail two months in advance. If you don't receive a letter, please call our office to schedule the follow-up appointment.   Any Other Special Instructions Will Be Listed Below (If Applicable).     If you need a refill on your cardiac medications before your next appointment, please call your pharmacy.

## 2020-02-11 ENCOUNTER — Ambulatory Visit: Payer: Medicare Other | Admitting: Cardiology

## 2020-03-06 ENCOUNTER — Ambulatory Visit: Payer: Medicare Other | Admitting: Nutrition

## 2020-03-06 ENCOUNTER — Telehealth: Payer: Self-pay | Admitting: Family Medicine

## 2020-03-06 NOTE — Chronic Care Management (AMB) (Signed)
°  Chronic Care Management   Note  03/06/2020 Name: MARTEZE VECCHIO MRN: [id number] DOB: Jan 30, 1941  [name] is a 79 y.o. year old male who is a primary care patient of [facility], Modena Nunnery, MD. I reached out to [name] by phone today in response to a referral sent by Mr. Quincy Carnes PCP, Alycia Rossetti, MD.   Mr. Baranowski was given information about Chronic Care Management services today including:  1. CCM service includes personalized support from designated clinical staff supervised by his physician, including individualized plan of care and coordination with other care providers 2. 24/7 contact phone numbers for assistance for urgent and routine care needs. 3. Service will only be billed when office clinical staff spend 20 minutes or more in a month to coordinate care. 4. Only one practitioner may furnish and bill the service in a calendar month. 5. The patient may stop CCM services at any time (effective at the end of the month) by phone call to the office staff.   Patient agreed to services and verbal consent obtained.   Follow up plan:   [facility]

## 2020-03-07 ENCOUNTER — Ambulatory Visit (INDEPENDENT_AMBULATORY_CARE_PROVIDER_SITE_OTHER): Payer: Medicare Other | Admitting: Family Medicine

## 2020-03-07 ENCOUNTER — Encounter: Payer: Self-pay | Admitting: Family Medicine

## 2020-03-07 ENCOUNTER — Other Ambulatory Visit: Payer: Self-pay

## 2020-03-07 ENCOUNTER — Other Ambulatory Visit: Payer: Self-pay | Admitting: *Deleted

## 2020-03-07 VITALS — BP 138/70 | HR 64 | Temp 97.9°F | Resp 16 | Ht 68.0 in | Wt 273.0 lb

## 2020-03-07 DIAGNOSIS — E1122 Type 2 diabetes mellitus with diabetic chronic kidney disease: Secondary | ICD-10-CM

## 2020-03-07 DIAGNOSIS — Z8673 Personal history of transient ischemic attack (TIA), and cerebral infarction without residual deficits: Secondary | ICD-10-CM | POA: Diagnosis not present

## 2020-03-07 DIAGNOSIS — I10 Essential (primary) hypertension: Secondary | ICD-10-CM | POA: Diagnosis not present

## 2020-03-07 DIAGNOSIS — Z794 Long term (current) use of insulin: Secondary | ICD-10-CM | POA: Diagnosis not present

## 2020-03-07 DIAGNOSIS — N183 Chronic kidney disease, stage 3 unspecified: Secondary | ICD-10-CM

## 2020-03-07 DIAGNOSIS — D638 Anemia in other chronic diseases classified elsewhere: Secondary | ICD-10-CM

## 2020-03-07 MED ORDER — VITAMIN B 12 500 MCG PO TABS: 1000.0000 ug | ORAL_TABLET | Freq: Every day | ORAL | 1 refills | Status: DC

## 2020-03-07 MED ORDER — AMLODIPINE BESYLATE 10 MG PO TABS: 10.0000 mg | ORAL_TABLET | Freq: Every day | ORAL | 1 refills | Status: DC

## 2020-03-07 MED ORDER — ASPIRIN-DIPYRIDAMOLE ER 25-200 MG PO CP12: 1.0000 | ORAL_CAPSULE | Freq: Two times a day (BID) | ORAL | 1 refills | Status: DC

## 2020-03-07 MED ORDER — INSULIN ASPART PROT & ASPART (70-30 MIX) 100 UNIT/ML ~~LOC~~ SUSP: SUBCUTANEOUS | 2 refills | Status: DC

## 2020-03-07 NOTE — Assessment & Plan Note (Signed)
On aggrenox and statin drug

## 2020-03-07 NOTE — Progress Notes (Signed)
Subjective:    Patient ID: [name], male    DOB: 01/20/1941, 79 y.o.   MRN: [id number]  Patient presents for Follow-up (is fasting)  Patient here to follow-up chronic medical problems.  Medications reviewed.  He is followed by myself as well as the [facility] [employer].  DM- last A1C 8.2% on December 23 I reviewed his labs from the [place] that he brought currently to be,  on70/30 42 units in the morning 50 units , No hypoglycemia episodes Did not bring meter, he has not had labs since then  He had to reschedule with dietician  Hyperlipidemia-  on statin drug, goal < 100 with history of stroke CAD  Diastolic heart failure/ chronic SOB with exertion and fatigue- some days are better than other, no chest pain.  Taking demadex 40mg    Along with aldactone, rewviewed cardiology note from April, need metabolic panel   He is also still on CPAP therapy for his obstructive sleep apnea which is monitored by the [place].     He has a spot on his lung that is being monitored by [place], not big enough for .   Followed by eye doctor - UTD, no longer on drops      Review Of Systems:  GEN- denies fatigue, fever, weight loss,weakness, recent illness HEENT- denies eye drainage, change in vision, nasal discharge, CVS- denies chest pain, palpitations RESP- denies SOB, cough, wheeze ABD- denies N/V, change in stools, abd pain GU- denies dysuria, hematuria, dribbling, incontinence MSK- denies joint pain, muscle aches, injury Neuro- denies headache, dizziness, syncope, seizure activity       Objective:    BP 138/70   Pulse 64   Temp 97.9 F (36.6 C) (Temporal)   Resp 16   Ht 5\' 8"  (1.727 m)   Wt 273 lb (123.8 kg)   SpO2 99%   BMI 41.51 kg/m  GEN- NAD, alert and oriented x3 HEENT- PERRL, EOMI, non injected sclera, pink conjunctiva, MMM, oropharynx clear Neck- Supple, no thyromegaly CVS- RRR, no murmur RESP-CTAB ABD-NABS,soft,NT,ND,  Ext- Swelling L >R, CHRONIC pedal  EDEMA,   Pulses- Radial,2+ DP 1+         Assessment & Plan:      Problem List Items Addressed This Visit      Unprioritized   Anemia of chronic disease    Iron level along with CBC. He states he has not followed up with a hematologist. He thinks that he is on an iron pill but did not have his medications with him. His wife was going to check his medicines when he gets home to let us know what we are missing from the list      Relevant Orders   Iron, TIBC and Ferritin Panel   CKD stage 3 due to type 2 diabetes mellitus (HCC)   Relevant Medications   insulin aspart protamine- aspart (NOVOLOG MIX 70/30) (70-30) 100 UNIT/ML injection   DM (diabetes mellitus) type II controlled with renal manifestation ([place])    Recheck A1C on MTF and insulin therapy No hypoglycemia symptoms Obtain labs for renal functiona s well, He has not had labs since December  Will schedule with [facility] when he can, to get new diabetic shoes       Relevant Medications   insulin aspart protamine- aspart (NOVOLOG MIX 70/30) (70-30) 100 UNIT/ML injection   Other Relevant Orders   Hemoglobin A1c   Lipid panel   HM DIABETES FOOT EXAM (Completed)   History of stroke  On aggrenox and statin drug       Relevant Orders   Lipid panel   HTN (hypertension) - Primary (Chronic)    Controlled no changes       Relevant Orders   CBC with Differential/Platelet   Comprehensive metabolic panel   Obesity, Class III, BMI 40-49.9 (morbid obesity) (HCC)   Relevant Medications   insulin aspart protamine- aspart (NOVOLOG MIX 70/30) (70-30) 100 UNIT/ML injection      Note: This dictation was prepared with Dragon dictation along with smaller phrase technology. Any transcriptional errors that result from this process are unintentional.

## 2020-03-07 NOTE — Patient Instructions (Signed)
F/U 4 months  Check the medication list We will call with lab results

## 2020-03-07 NOTE — Assessment & Plan Note (Signed)
Iron level along with CBC. He states he has not followed up with a hematologist. He thinks that he is on an iron pill but did not have his medications with him. His wife was going to check his medicines when he gets home to let us know what we are missing from the list

## 2020-03-07 NOTE — Assessment & Plan Note (Signed)
Controlled no changes 

## 2020-03-07 NOTE — Assessment & Plan Note (Signed)
Recheck A1C on MTF and insulin therapy No hypoglycemia symptoms Obtain labs for renal functiona s well, He has not had labs since December  Will schedule with [facility] when he can, to get new diabetic shoes

## 2020-03-08 LAB — LIPID PANEL
Cholesterol: 154 mg/dL (ref <200)
HDL: 35 mg/dL — ABNORMAL LOW (ref >=40)
LDL Cholesterol (Calc): 98 mg/dL (calc)
Non-HDL Cholesterol (Calc): 119 mg/dL (calc) (ref <130)
Total CHOL/HDL Ratio: 4.4 (calc) (ref <5.0)
Triglycerides: 118 mg/dL (ref <150)

## 2020-03-08 LAB — CBC WITH DIFFERENTIAL/PLATELET
Absolute Monocytes: 754 cells/uL (ref 200–950)
Basophils Absolute: 13 cells/uL (ref 0–200)
Basophils Relative: 0.2 %
Eosinophils Absolute: 59 cells/uL (ref 15–500)
Eosinophils Relative: 0.9 %
HCT: 35.1 % — ABNORMAL LOW (ref 38.5–50.0)
Hemoglobin: 11.6 g/dL — ABNORMAL LOW (ref 13.2–17.1)
Lymphs Abs: 2022 cells/uL (ref 850–3900)
MCH: 32.1 pg (ref 27.0–33.0)
MCHC: 33 g/dL (ref 32.0–36.0)
MCV: 97.2 fL (ref 80.0–100.0)
MPV: 10.6 fL (ref 7.5–12.5)
Monocytes Relative: 11.6 %
Neutro Abs: 3653 cells/uL (ref 1500–7800)
Neutrophils Relative %: 56.2 %
Platelets: 180 10*3/uL (ref 140–400)
RBC: 3.61 10*6/uL — ABNORMAL LOW (ref 4.20–5.80)
RDW: 13.1 % (ref 11.0–15.0)
Total Lymphocyte: 31.1 %
WBC: 6.5 10*3/uL (ref 3.8–10.8)

## 2020-03-08 LAB — COMPREHENSIVE METABOLIC PANEL
AG Ratio: 1.4 (calc) (ref 1.0–2.5)
ALT: 15 U/L (ref 9–46)
AST: 15 U/L (ref 10–35)
Albumin: 4 g/dL (ref 3.6–5.1)
Alkaline phosphatase (APISO): 45 U/L (ref 35–144)
BUN/Creatinine Ratio: 22 (calc) (ref 6–22)
BUN: 35 mg/dL — ABNORMAL HIGH (ref 7–25)
CO2: 23 mmol/L (ref 20–32)
Calcium: 9.5 mg/dL (ref 8.6–10.3)
Chloride: 106 mmol/L (ref 98–110)
Creat: 1.61 mg/dL — ABNORMAL HIGH (ref 0.70–1.18)
Globulin: 2.9 g/dL (calc) (ref 1.9–3.7)
Glucose, Bld: 95 mg/dL (ref 65–99)
Potassium: 4.4 mmol/L (ref 3.5–5.3)
Sodium: 139 mmol/L (ref 135–146)
Total Bilirubin: 0.4 mg/dL (ref 0.2–1.2)
Total Protein: 6.9 g/dL (ref 6.1–8.1)

## 2020-03-08 LAB — IRON,TIBC AND FERRITIN PANEL
%SAT: 26 % (calc) (ref 20–48)
Ferritin: 40 ng/mL (ref 24–380)
Iron: 71 ug/dL (ref 50–180)
TIBC: 269 mcg/dL (calc) (ref 250–425)

## 2020-03-08 LAB — HEMOGLOBIN A1C
Hgb A1c MFr Bld: 8.2 % of total Hgb — ABNORMAL HIGH (ref <5.7)
Mean Plasma Glucose: 189 (calc)
eAG (mmol/L): 10.4 (calc)

## 2020-03-12 ENCOUNTER — Other Ambulatory Visit: Payer: Self-pay | Admitting: *Deleted

## 2020-03-12 MED ORDER — INSULIN ASPART PROT & ASPART (70-30 MIX) 100 UNIT/ML ~~LOC~~ SUSP: SUBCUTANEOUS | 2 refills | Status: DC

## 2020-03-21 DIAGNOSIS — H30033 Focal chorioretinal inflammation, peripheral, bilateral: Secondary | ICD-10-CM | POA: Diagnosis not present

## 2020-03-21 DIAGNOSIS — Z79899 Other long term (current) drug therapy: Secondary | ICD-10-CM | POA: Diagnosis not present

## 2020-04-01 ENCOUNTER — Other Ambulatory Visit: Payer: Self-pay

## 2020-04-01 ENCOUNTER — Ambulatory Visit: Payer: Medicare Other | Admitting: Pharmacist

## 2020-04-01 NOTE — Chronic Care Management (AMB) (Signed)
   Chronic Care Management Pharmacy  Name: [name]  MRN: [id number] DOB: 1940-12-20  Chief Complaint/ HPI  [name],  79 y.o. , male presents for their Initial CCM visit with the clinical pharmacist via telephone.  PCP : Charles Rossetti, MD  Their chronic conditions include: hypertension, CAD, diabetes, CKD, hyperlipidemia.  Office Visits:  03/07/2020 [facility]) - regular follow up visit, obtained fasting labs, no med changes.  After blood work his insulin was increased to 44 units in the morning and continued 50 units at bedtime.  11/12/2019 [facility]) - malodorous urine, will take extra diuretic, urinalysis was negative  Consult Visit:  02/08/2020 [name], Cardiology) - less swelling reported since switch to [facility], no chest pain[facility] Plavix from Aggrenox as suggested, so still on Aggrenox.  Medications: Outpatient Encounter Medications as of 04/01/2020  Medication Sig  . acetaminophen (TYLENOL) 650 MG CR tablet Take 1,300 mg by mouth every 8 (eight) hours as needed for pain.  [name] amLODipine (NORVASC) 10 MG tablet Take 1 tablet (10 mg total) by mouth daily.  [name] atorvastatin (LIPITOR) 80 MG tablet Take 80 mg by mouth daily.  . clotrimazole-betamethasone (LOTRISONE) cream Apply 1 application topically 2 (two) times daily. (Patient taking differently: Apply 1 application topically as needed. )  . Cyanocobalamin (VITAMIN B 12) 500 MCG TABS Take 1,000 mcg by mouth daily.  [name] dipyridamole-aspirin (AGGRENOX) 200-25 MG 12hr capsule Take 1 capsule by mouth 2 (two) times daily.  . ferrous sulfate 325 (65 FE) MG tablet Take 325 mg by mouth daily with breakfast.  . insulin aspart protamine- aspart (NOVOLOG MIX 70/30) (70-30) 100 UNIT/ML injection Patient takes 44 units in the morning and 50 units at night.  . losartan (COZAAR) 100 MG tablet Take 100 mg by mouth daily.    . metFORMIN (GLUCOPHAGE) 1000 MG tablet Take 1,000 mg by mouth 2 (two) times  daily with a meal.    . metoprolol (LOPRESSOR) 50 MG tablet Take 0.5 tablets (25 mg total) by mouth 2 (two) times daily. FOR HEART/BLOOD PRESSURE. HOLD FOR SYSTOLIC BLOOD PRESSURE LESS THAN 1OO/HEART RATE LESS THAN 60  . potassium chloride (KLOR-CON) 10 MEQ tablet TAKE 2 TABLETS BY MOUTH ONCE DAILY WITH  FUROSEMIDE  . spironolactone (ALDACTONE) 25 MG tablet Take 25 mg by mouth daily.  . tamsulosin (FLOMAX) 0.4 MG CAPS capsule Take 2 capsules (0.8 mg total) by mouth every evening.  . torsemide (DEMADEX) 20 MG tablet Take 2 tablets (40 mg total) by mouth daily.   No facility-administered encounter medications on file as of 04/01/2020.    Unable to do visit with patient today.  He did not want to go over medication list and insisted upon no questions.  Attempted to provide number to reach out with questions, patient declined.  Beverly Milch, PharmD Clinical Pharmacist [place] [phone]

## 2020-04-02 ENCOUNTER — Other Ambulatory Visit: Payer: Self-pay | Admitting: Family Medicine

## 2020-04-02 DIAGNOSIS — E1122 Type 2 diabetes mellitus with diabetic chronic kidney disease: Secondary | ICD-10-CM

## 2020-04-02 DIAGNOSIS — Z794 Long term (current) use of insulin: Secondary | ICD-10-CM

## 2020-04-02 DIAGNOSIS — N183 Chronic kidney disease, stage 3 unspecified: Secondary | ICD-10-CM

## 2020-04-02 DIAGNOSIS — I10 Essential (primary) hypertension: Secondary | ICD-10-CM

## 2020-05-01 ENCOUNTER — Inpatient Hospital Stay (HOSPITAL_COMMUNITY)
Admission: EM | Admit: 2020-05-01 | Discharge: 2020-05-03 | DRG: 309 | Disposition: A | Discharge status: Home / Self Care | Payer: No Typology Code available for payment source | Attending: Internal Medicine | Admitting: Internal Medicine

## 2020-05-01 ENCOUNTER — Emergency Department (HOSPITAL_COMMUNITY): Payer: No Typology Code available for payment source

## 2020-05-01 ENCOUNTER — Other Ambulatory Visit: Payer: Self-pay

## 2020-05-01 ENCOUNTER — Encounter (HOSPITAL_COMMUNITY): Payer: Self-pay

## 2020-05-01 DIAGNOSIS — N1831 Chronic kidney disease, stage 3a: Secondary | ICD-10-CM | POA: Diagnosis present

## 2020-05-01 DIAGNOSIS — Z7902 Long term (current) use of antithrombotics/antiplatelets: Secondary | ICD-10-CM

## 2020-05-01 DIAGNOSIS — R531 Weakness: Secondary | ICD-10-CM | POA: Diagnosis not present

## 2020-05-01 DIAGNOSIS — G4733 Obstructive sleep apnea (adult) (pediatric): Secondary | ICD-10-CM | POA: Diagnosis present

## 2020-05-01 DIAGNOSIS — R600 Localized edema: Secondary | ICD-10-CM | POA: Diagnosis present

## 2020-05-01 DIAGNOSIS — N400 Enlarged prostate without lower urinary tract symptoms: Secondary | ICD-10-CM | POA: Diagnosis present

## 2020-05-01 DIAGNOSIS — K219 Gastro-esophageal reflux disease without esophagitis: Secondary | ICD-10-CM | POA: Diagnosis present

## 2020-05-01 DIAGNOSIS — Z951 Presence of aortocoronary bypass graft: Secondary | ICD-10-CM

## 2020-05-01 DIAGNOSIS — D631 Anemia in chronic kidney disease: Secondary | ICD-10-CM | POA: Diagnosis present

## 2020-05-01 DIAGNOSIS — I130 Hypertensive heart and chronic kidney disease with heart failure and stage 1 through stage 4 chronic kidney disease, or unspecified chronic kidney disease: Secondary | ICD-10-CM | POA: Diagnosis present

## 2020-05-01 DIAGNOSIS — I69354 Hemiplegia and hemiparesis following cerebral infarction affecting left non-dominant side: Secondary | ICD-10-CM

## 2020-05-01 DIAGNOSIS — Z6841 Body Mass Index (BMI) 40.0 and over, adult: Secondary | ICD-10-CM

## 2020-05-01 DIAGNOSIS — I4891 Unspecified atrial fibrillation: Secondary | ICD-10-CM | POA: Diagnosis not present

## 2020-05-01 DIAGNOSIS — E876 Hypokalemia: Secondary | ICD-10-CM | POA: Diagnosis present

## 2020-05-01 DIAGNOSIS — E785 Hyperlipidemia, unspecified: Secondary | ICD-10-CM | POA: Diagnosis present

## 2020-05-01 DIAGNOSIS — Z794 Long term (current) use of insulin: Secondary | ICD-10-CM

## 2020-05-01 DIAGNOSIS — I2510 Atherosclerotic heart disease of native coronary artery without angina pectoris: Secondary | ICD-10-CM | POA: Diagnosis present

## 2020-05-01 DIAGNOSIS — Z9049 Acquired absence of other specified parts of digestive tract: Secondary | ICD-10-CM

## 2020-05-01 DIAGNOSIS — I5032 Chronic diastolic (congestive) heart failure: Secondary | ICD-10-CM | POA: Diagnosis present

## 2020-05-01 DIAGNOSIS — E1151 Type 2 diabetes mellitus with diabetic peripheral angiopathy without gangrene: Secondary | ICD-10-CM | POA: Diagnosis present

## 2020-05-01 DIAGNOSIS — Z20822 Contact with and (suspected) exposure to covid-19: Secondary | ICD-10-CM | POA: Diagnosis present

## 2020-05-01 DIAGNOSIS — Z87891 Personal history of nicotine dependence: Secondary | ICD-10-CM

## 2020-05-01 DIAGNOSIS — H409 Unspecified glaucoma: Secondary | ICD-10-CM | POA: Diagnosis present

## 2020-05-01 DIAGNOSIS — I248 Other forms of acute ischemic heart disease: Secondary | ICD-10-CM | POA: Diagnosis present

## 2020-05-01 DIAGNOSIS — Z91041 Radiographic dye allergy status: Secondary | ICD-10-CM

## 2020-05-01 DIAGNOSIS — E1122 Type 2 diabetes mellitus with diabetic chronic kidney disease: Secondary | ICD-10-CM | POA: Diagnosis present

## 2020-05-01 DIAGNOSIS — Z8249 Family history of ischemic heart disease and other diseases of the circulatory system: Secondary | ICD-10-CM

## 2020-05-01 DIAGNOSIS — D72829 Elevated white blood cell count, unspecified: Secondary | ICD-10-CM | POA: Diagnosis present

## 2020-05-01 DIAGNOSIS — Z79899 Other long term (current) drug therapy: Secondary | ICD-10-CM

## 2020-05-01 DIAGNOSIS — N179 Acute kidney failure, unspecified: Secondary | ICD-10-CM | POA: Diagnosis present

## 2020-05-01 DIAGNOSIS — Z833 Family history of diabetes mellitus: Secondary | ICD-10-CM

## 2020-05-01 LAB — COMPREHENSIVE METABOLIC PANEL
ALT: 22 U/L (ref 0–44)
AST: 19 U/L (ref 15–41)
Albumin: 4 g/dL (ref 3.5–5.0)
Alkaline Phosphatase: 49 U/L (ref 38–126)
Anion gap: 13 (ref 5–15)
BUN: 39 mg/dL — ABNORMAL HIGH (ref 8–23)
CO2: 22 mmol/L (ref 22–32)
Calcium: 8.8 mg/dL — ABNORMAL LOW (ref 8.9–10.3)
Chloride: 104 mmol/L (ref 98–111)
Creatinine, Ser: 2.41 mg/dL — ABNORMAL HIGH (ref 0.61–1.24)
GFR calc Af Amer: 29 mL/min — ABNORMAL LOW (ref >60)
GFR calc non Af Amer: 25 mL/min — ABNORMAL LOW (ref >60)
Glucose, Bld: 69 mg/dL — ABNORMAL LOW (ref 70–99)
Potassium: 3.3 mmol/L — ABNORMAL LOW (ref 3.5–5.1)
Sodium: 139 mmol/L (ref 135–145)
Total Bilirubin: 0.5 mg/dL (ref 0.3–1.2)
Total Protein: 7.9 g/dL (ref 6.5–8.1)

## 2020-05-01 LAB — CBC WITH DIFFERENTIAL/PLATELET
Abs Immature Granulocytes: 0.07 10*3/uL (ref 0.00–0.07)
Basophils Absolute: 0.1 10*3/uL (ref 0.0–0.1)
Basophils Relative: 0 %
Eosinophils Absolute: 0.1 10*3/uL (ref 0.0–0.5)
Eosinophils Relative: 1 %
HCT: 36.3 % — ABNORMAL LOW (ref 39.0–52.0)
Hemoglobin: 11.4 g/dL — ABNORMAL LOW (ref 13.0–17.0)
Immature Granulocytes: 1 %
Lymphocytes Relative: 52 %
Lymphs Abs: 7 10*3/uL — ABNORMAL HIGH (ref 0.7–4.0)
MCH: 31.8 pg (ref 26.0–34.0)
MCHC: 31.4 g/dL (ref 30.0–36.0)
MCV: 101.4 fL — ABNORMAL HIGH (ref 80.0–100.0)
Monocytes Absolute: 1.4 10*3/uL — ABNORMAL HIGH (ref 0.1–1.0)
Monocytes Relative: 11 %
Neutro Abs: 4.7 10*3/uL (ref 1.7–7.7)
Neutrophils Relative %: 35 %
Platelets: 215 10*3/uL (ref 150–400)
RBC: 3.58 MIL/uL — ABNORMAL LOW (ref 4.22–5.81)
RDW: 13.5 % (ref 11.5–15.5)
WBC: 13.3 10*3/uL — ABNORMAL HIGH (ref 4.0–10.5)
nRBC: 0 % (ref 0.0–0.2)

## 2020-05-01 LAB — TROPONIN I (HIGH SENSITIVITY)
Troponin I (High Sensitivity): 6 ng/L (ref <18)
Troponin I (High Sensitivity): 25 ng/L — ABNORMAL HIGH (ref <18)

## 2020-05-01 MED ORDER — DILTIAZEM HCL 25 MG/5ML IV SOLN
10.0000 mg | Freq: Once | INTRAVENOUS | Status: AC
  Administered 2020-05-01: 10 mg via INTRAVENOUS
  Filled 2020-05-01: qty 5

## 2020-05-01 MED ORDER — HYDROMORPHONE HCL 1 MG/ML IJ SOLN
0.5000 mg | Freq: Once | INTRAMUSCULAR | Status: AC
  Administered 2020-05-01: 0.5 mg via INTRAVENOUS
  Filled 2020-05-01: qty 1

## 2020-05-01 MED ORDER — DILTIAZEM HCL-DEXTROSE 125-5 MG/125ML-% IV SOLN (PREMIX)
10.0000 mg/h | INTRAVENOUS | Status: DC
  Administered 2020-05-01: 5 mg/h via INTRAVENOUS
  Filled 2020-05-01: qty 125

## 2020-05-01 NOTE — ED Provider Notes (Signed)
[facility] EMERGENCY DEPARTMENT Provider Note   CSN: [id number] Arrival date & time: 05/01/20  1905     History Chief Complaint  Patient presents with  . Muscle Cramps    [name] is a 79 y.o. male.  Patient complains of weakness and muscle cramps.  The history is provided by the patient, medical records and a significant other. No language interpreter was used.  Weakness Severity:  Moderate Onset quality:  Sudden Timing:  Constant Progression:  Worsening Chronicity:  New Context: not alcohol use   Relieved by:  Nothing Worsened by:  Nothing Ineffective treatments:  None tried Associated symptoms: no abdominal pain, no chest pain, no cough, no diarrhea, no frequency, no headaches and no seizures   Risk factors: no anemia        Past Medical History:  Diagnosis Date  . Anemia   . C. difficile diarrhea   . CKD (chronic kidney disease), stage III   . Coronary artery disease    Status post CABG in 2007 [facility] [place])  . Diabetes mellitus with stage 1 chronic kidney disease ([place]) 05/20/2011  . GERD (gastroesophageal reflux disease)   . Glaucoma   . Hyperlipidemia   . Hypertension   . Morbid obesity ([place])   . Peripheral vascular disease ([place])   . Sleep apnea   . Stroke [facility]) 2008, 2014, 2015    Patient Active Problem List   Diagnosis Date Noted  . Anemia of chronic disease 03/07/2020  . CKD stage 3 due to type 2 diabetes mellitus ([place]) 06/12/2019  . Chest pain 06/07/2018  . Hypoxia   . Lactic acidosis 02/24/2018  . Dyspnea 02/20/2018  . BPH (benign prostatic hyperplasia) 02/20/2018  . DDD (degenerative disc disease), lumbar 10/17/2017  . Chronic diastolic heart failure ([place]) 10/20/2015  . Peripheral edema 01/22/2015  . Tinea pedis 10/21/2014  . 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 Chronic kidney disease 11/21/2013  . Left leg weakness 09/06/2013  . Left-sided weakness 08/22/2013  . OSA  (obstructive sleep apnea) 04/12/2013  . DM (diabetes mellitus) type II controlled with renal manifestation ([place]) 11/28/2012  . PVD (peripheral vascular disease) ([place]) 11/28/2012  . CAD (coronary artery disease) 11/28/2012  . Hyperlipidemia 11/28/2012  . Obesity, Class III, BMI 40-49.9 (morbid obesity) ([place]) 11/28/2012  . History of stroke 11/28/2012  . OAB (overactive bladder) 11/28/2012  . HTN (hypertension) 05/20/2011    Past Surgical History:  Procedure Laterality Date  . CHOLECYSTECTOMY    . CORONARY ARTERY BYPASS GRAFT    . NO PAST SURGERIES    . THYROID SURGERY         Family History  Problem Relation Age of Onset  . Diabetes Mother   . Heart failure Mother   . Hypertension Mother   . Diabetes Father   . Heart failure Father   . Hypertension Father   . Diabetes Sister   . Heart failure Sister   . Hypertension Sister   . Hyperlipidemia Sister   . Diabetes Brother   . Heart failure Brother   . Hypertension Brother     Social History   Tobacco Use  . Smoking status: Former Smoker    Types: Cigarettes  . Smokeless tobacco: Never Used  Vaping Use  . Vaping Use: Never used  Substance Use Topics  . Alcohol use: No  . Drug use: No    Home Medications Prior to Admission medications   Medication Sig Start Date  End Date Taking? Authorizing Provider  acetaminophen (TYLENOL) 650 MG CR tablet Take 1,300 mg by mouth every 8 (eight) hours as needed for pain.   Yes [provider]  amLODipine (NORVASC) 10 MG tablet Take 1 tablet (10 mg total) by mouth daily. 03/07/20  Yes [place], Modena Nunnery, MD  atorvastatin (LIPITOR) 80 MG tablet Take 80 mg by mouth every evening.    Yes [provider]  clotrimazole-betamethasone (LOTRISONE) cream Apply 1 application topically 2 (two) times daily. Patient taking differently: Apply 1 application topically as needed.  12/02/17  Yes [place], Modena Nunnery, MD  Cyanocobalamin (VITAMIN B 12) 500 MCG TABS Take 1,000 mcg by mouth  daily. 03/07/20  Yes [place], Modena Nunnery, MD  dipyridamole-aspirin (AGGRENOX) 200-25 MG 12hr capsule Take 1 capsule by mouth 2 (two) times daily. 03/07/20  Yes [place], Modena Nunnery, MD  ferrous sulfate 325 (65 FE) MG tablet Take 325 mg by mouth daily with breakfast.   Yes [provider]  insulin aspart protamine- aspart (NOVOLOG MIX 70/30) (70-30) 100 UNIT/ML injection Patient takes 44 units in the morning and 50 units at night. Patient taking differently: Inject 44-50 Units into the skin See admin instructions. Patient takes 44 units in the morning and 50 units at night. 03/12/20  Yes [place], Modena Nunnery, MD  losartan (COZAAR) 100 MG tablet Take 100 mg by mouth daily.     Yes [provider]  metFORMIN (GLUCOPHAGE) 1000 MG tablet Take 1,000 mg by mouth 2 (two) times daily with a meal.     Yes [provider]  metoprolol (LOPRESSOR) 50 MG tablet Take 0.5 tablets (25 mg total) by mouth 2 (two) times daily. FOR HEART/BLOOD PRESSURE. HOLD FOR SYSTOLIC BLOOD PRESSURE LESS THAN 1OO/HEART RATE LESS THAN 60 05/20/11  Yes Annita Brod, MD  potassium chloride (KLOR-CON) 10 MEQ tablet TAKE 2 TABLETS BY MOUTH ONCE DAILY WITH  FUROSEMIDE Patient taking differently: Take 10 mEq by mouth 2 (two) times daily.  12/31/19  Yes [place], Modena Nunnery, MD  spironolactone (ALDACTONE) 25 MG tablet Take 25 mg by mouth daily.   Yes [provider]  tamsulosin (FLOMAX) 0.4 MG CAPS capsule Take 2 capsules (0.8 mg total) by mouth every evening. 06/09/16  Yes [place], Modena Nunnery, MD  torsemide (DEMADEX) 20 MG tablet Take 2 tablets (40 mg total) by mouth daily. 11/13/19  Yes Satira Sark, MD    Allergies    Ivp dye [iodinated diagnostic agents]  Review of Systems   Review of Systems  Constitutional: Negative for appetite change and fatigue.  HENT: Negative for congestion, ear discharge and sinus pressure.   Eyes: Negative for discharge.  Respiratory: Negative for cough.   Cardiovascular: Negative  for chest pain.  Gastrointestinal: Negative for abdominal pain and diarrhea.  Genitourinary: Negative for frequency and hematuria.  Musculoskeletal: Negative for back pain.  Skin: Negative for rash.  Neurological: Positive for weakness. Negative for seizures and headaches.  Psychiatric/Behavioral: Negative for hallucinations.    Physical Exam Updated Vital Signs BP (!) 132/52   Pulse (!) 108   Temp 98.4 F (36.9 C) (Oral)   Resp 18   Ht 5\' 8"  (1.727 m)   Wt 119.3 kg   SpO2 94%   BMI 39.99 kg/m   Physical Exam Vitals and nursing note reviewed.  Constitutional:      Appearance: He is well-developed.  HENT:     Head: Normocephalic.     Nose: Nose normal.  Eyes:  General: No scleral icterus.    Conjunctiva/sclera: Conjunctivae normal.  Neck:     Thyroid: No thyromegaly.  Cardiovascular:     Heart sounds: No murmur heard.  No friction rub. No gallop.      Comments: Irregular rapid rate Pulmonary:     Breath sounds: No stridor. No wheezing or rales.  Chest:     Chest wall: No tenderness.  Abdominal:     General: There is no distension.     Tenderness: There is no abdominal tenderness. There is no rebound.  Musculoskeletal:        General: Normal range of motion.     Cervical back: Neck supple.  Lymphadenopathy:     Cervical: No cervical adenopathy.  Skin:    Findings: No erythema or rash.  Neurological:     Mental Status: He is alert and oriented to person, place, and time.     Motor: No abnormal muscle tone.     Coordination: Coordination normal.  Psychiatric:        Behavior: Behavior normal.     ED Results / Procedures / Treatments   Labs (all labs ordered are listed, but only abnormal results are displayed) Labs Reviewed  CBC WITH DIFFERENTIAL/PLATELET - Abnormal; Notable for the following components:      Result Value   WBC 13.3 (*)    RBC 3.58 (*)    Hemoglobin 11.4 (*)    HCT 36.3 (*)    MCV 101.4 (*)    Lymphs Abs 7.0 (*)    Monocytes  Absolute 1.4 (*)    All other components within normal limits  COMPREHENSIVE METABOLIC PANEL - Abnormal; Notable for the following components:   Potassium 3.3 (*)    Glucose, Bld 69 (*)    BUN 39 (*)    Creatinine, Ser 2.41 (*)    Calcium 8.8 (*)    GFR calc non Af Amer 25 (*)    GFR calc Af Amer 29 (*)    All other components within normal limits  TROPONIN I (HIGH SENSITIVITY)  TROPONIN I (HIGH SENSITIVITY)    EKG None  Radiology DG Chest Port 1 View  Result Date: 05/01/2020 CLINICAL DATA:  Weakness.  Muscle cramps. EXAM: PORTABLE CHEST 1 VIEW COMPARISON:  10/28/2019 FINDINGS: Prior CABG. Tortuous thoracic aorta. Heart size within normal limits for projection. The lungs appear clear. No blunting of the costophrenic angles. IMPRESSION: 1. No active cardiopulmonary disease is radiographically apparent. 2. Prior CABG. Electronically Signed   By: Van Clines M.D.   On: 05/01/2020 20:13    Procedures Procedures (including critical care time)  Medications Ordered in ED Medications  diltiazem (CARDIZEM) 125 mg in dextrose 5% 125 mL (1 mg/mL) infusion (10 mg/hr Intravenous Rate/Dose Change 05/01/20 2148)  diltiazem (CARDIZEM) injection 10 mg (10 mg Intravenous Given 05/01/20 2018)  HYDROmorphone (DILAUDID) injection 0.5 mg (0.5 mg Intravenous Given 05/01/20 2057)    ED Course  I have reviewed the triage vital signs and the nursing notes.  Pertinent labs & imaging results that were available during my care of the patient were reviewed by me and considered in my medical decision making (see chart for details). CRITICAL CARE Performed by: Milton Ferguson Total critical care time:40 minutes Critical care time was exclusive of separately billable procedures and treating other patients. Critical care was necessary to treat or prevent imminent or life-threatening deterioration. Critical care was time spent personally by me on the following activities: development of treatment plan with  patient and/or  surrogate as well as nursing, discussions with consultants, evaluation of patient's response to treatment, examination of patient, obtaining history from patient or surrogate, ordering and performing treatments and interventions, ordering and review of laboratory studies, ordering and review of radiographic studies, pulse oximetry and re-evaluation of patient's condition.    MDM Rules/Calculators/A&P                          Patient with new onset atrial flutter with rapid rate.  He is controlled on Cardizem will be admitted to medicine         This patient presents to the ED for concern of cramping, this involves an extensive number of treatment options, and is a complaint that carries with it a high risk of complications and morbidity.  The differential diagnosis includes hypokalemia   Lab Tests:   I Ordered, reviewed, and interpreted labs, which included CBC and chemistries.  Patient has an elevated white count kidney disease and hypokalemia  Medicines ordered:   I ordered medication Cardizem for atrial fib  Imaging Studies ordered:   I ordered imaging studies which included chest x-ray and  I independently visualized and interpreted imaging which showed unremarkable  Additional history obtained:   Additional history obtained from spouse  Previous records obtained and reviewed.  Consultations Obtained:   I consulted hospitalist and discussed lab and imaging findings  Reevaluation:  After the interventions stated above, I reevaluated the patient and found moderately improved  Critical Interventions:  .   Final Clinical Impression(s) / ED Diagnoses Final diagnoses:  Atrial fibrillation with RVR [facility])    Rx / [place] Orders ED Discharge Orders    None       Milton Ferguson, MD 05/01/20 2212

## 2020-05-01 NOTE — ED Triage Notes (Signed)
Pt presents to ED with complaints of muscle cramps all over. Pt states it started this evening.

## 2020-05-02 ENCOUNTER — Inpatient Hospital Stay (HOSPITAL_COMMUNITY): Payer: No Typology Code available for payment source

## 2020-05-02 DIAGNOSIS — Z20822 Contact with and (suspected) exposure to covid-19: Secondary | ICD-10-CM | POA: Diagnosis present

## 2020-05-02 DIAGNOSIS — I2510 Atherosclerotic heart disease of native coronary artery without angina pectoris: Secondary | ICD-10-CM | POA: Diagnosis present

## 2020-05-02 DIAGNOSIS — I4891 Unspecified atrial fibrillation: Principal | ICD-10-CM

## 2020-05-02 DIAGNOSIS — I361 Nonrheumatic tricuspid (valve) insufficiency: Secondary | ICD-10-CM

## 2020-05-02 DIAGNOSIS — I69354 Hemiplegia and hemiparesis following cerebral infarction affecting left non-dominant side: Secondary | ICD-10-CM | POA: Diagnosis not present

## 2020-05-02 DIAGNOSIS — N179 Acute kidney failure, unspecified: Secondary | ICD-10-CM

## 2020-05-02 DIAGNOSIS — E1151 Type 2 diabetes mellitus with diabetic peripheral angiopathy without gangrene: Secondary | ICD-10-CM | POA: Diagnosis present

## 2020-05-02 DIAGNOSIS — R778 Other specified abnormalities of plasma proteins: Secondary | ICD-10-CM

## 2020-05-02 DIAGNOSIS — H409 Unspecified glaucoma: Secondary | ICD-10-CM | POA: Diagnosis present

## 2020-05-02 DIAGNOSIS — E1122 Type 2 diabetes mellitus with diabetic chronic kidney disease: Secondary | ICD-10-CM | POA: Diagnosis present

## 2020-05-02 DIAGNOSIS — N183 Chronic kidney disease, stage 3 unspecified: Secondary | ICD-10-CM

## 2020-05-02 DIAGNOSIS — E785 Hyperlipidemia, unspecified: Secondary | ICD-10-CM | POA: Diagnosis present

## 2020-05-02 DIAGNOSIS — D631 Anemia in chronic kidney disease: Secondary | ICD-10-CM | POA: Diagnosis present

## 2020-05-02 DIAGNOSIS — N400 Enlarged prostate without lower urinary tract symptoms: Secondary | ICD-10-CM | POA: Diagnosis present

## 2020-05-02 DIAGNOSIS — G4733 Obstructive sleep apnea (adult) (pediatric): Secondary | ICD-10-CM | POA: Diagnosis present

## 2020-05-02 DIAGNOSIS — I248 Other forms of acute ischemic heart disease: Secondary | ICD-10-CM | POA: Diagnosis present

## 2020-05-02 DIAGNOSIS — I10 Essential (primary) hypertension: Secondary | ICD-10-CM

## 2020-05-02 DIAGNOSIS — K219 Gastro-esophageal reflux disease without esophagitis: Secondary | ICD-10-CM | POA: Diagnosis present

## 2020-05-02 DIAGNOSIS — E876 Hypokalemia: Secondary | ICD-10-CM | POA: Diagnosis present

## 2020-05-02 DIAGNOSIS — I5032 Chronic diastolic (congestive) heart failure: Secondary | ICD-10-CM

## 2020-05-02 DIAGNOSIS — Z833 Family history of diabetes mellitus: Secondary | ICD-10-CM | POA: Diagnosis not present

## 2020-05-02 DIAGNOSIS — N1831 Chronic kidney disease, stage 3a: Secondary | ICD-10-CM | POA: Diagnosis present

## 2020-05-02 DIAGNOSIS — Z6841 Body Mass Index (BMI) 40.0 and over, adult: Secondary | ICD-10-CM | POA: Diagnosis not present

## 2020-05-02 DIAGNOSIS — R600 Localized edema: Secondary | ICD-10-CM | POA: Diagnosis present

## 2020-05-02 DIAGNOSIS — Z951 Presence of aortocoronary bypass graft: Secondary | ICD-10-CM | POA: Diagnosis not present

## 2020-05-02 DIAGNOSIS — D72829 Elevated white blood cell count, unspecified: Secondary | ICD-10-CM | POA: Diagnosis present

## 2020-05-02 DIAGNOSIS — I130 Hypertensive heart and chronic kidney disease with heart failure and stage 1 through stage 4 chronic kidney disease, or unspecified chronic kidney disease: Secondary | ICD-10-CM | POA: Diagnosis present

## 2020-05-02 LAB — GLUCOSE, CAPILLARY
Glucose-Capillary: 261 mg/dL — ABNORMAL HIGH (ref 70–99)
Glucose-Capillary: 294 mg/dL — ABNORMAL HIGH (ref 70–99)
Glucose-Capillary: 277 mg/dL — ABNORMAL HIGH (ref 70–99)

## 2020-05-02 LAB — CBC WITH DIFFERENTIAL/PLATELET
Abs Immature Granulocytes: 0.04 10*3/uL (ref 0.00–0.07)
Basophils Absolute: 0 10*3/uL (ref 0.0–0.1)
Basophils Relative: 0 %
Eosinophils Absolute: 0 10*3/uL (ref 0.0–0.5)
Eosinophils Relative: 0 %
HCT: 33.7 % — ABNORMAL LOW (ref 39.0–52.0)
Hemoglobin: 10.5 g/dL — ABNORMAL LOW (ref 13.0–17.0)
Immature Granulocytes: 0 %
Lymphocytes Relative: 19 %
Lymphs Abs: 1.8 10*3/uL (ref 0.7–4.0)
MCH: 31.1 pg (ref 26.0–34.0)
MCHC: 31.2 g/dL (ref 30.0–36.0)
MCV: 99.7 fL (ref 80.0–100.0)
Monocytes Absolute: 1 10*3/uL (ref 0.1–1.0)
Monocytes Relative: 10 %
Neutro Abs: 6.5 10*3/uL (ref 1.7–7.7)
Neutrophils Relative %: 71 %
Platelets: 175 10*3/uL (ref 150–400)
RBC: 3.38 MIL/uL — ABNORMAL LOW (ref 4.22–5.81)
RDW: 13.6 % (ref 11.5–15.5)
WBC: 9.3 10*3/uL (ref 4.0–10.5)
nRBC: 0 % (ref 0.0–0.2)

## 2020-05-02 LAB — COMPREHENSIVE METABOLIC PANEL
ALT: 19 U/L (ref 0–44)
AST: 21 U/L (ref 15–41)
Albumin: 3.4 g/dL — ABNORMAL LOW (ref 3.5–5.0)
Alkaline Phosphatase: 43 U/L (ref 38–126)
Anion gap: 13 (ref 5–15)
BUN: 42 mg/dL — ABNORMAL HIGH (ref 8–23)
CO2: 23 mmol/L (ref 22–32)
Calcium: 8.2 mg/dL — ABNORMAL LOW (ref 8.9–10.3)
Chloride: 101 mmol/L (ref 98–111)
Creatinine, Ser: 2.31 mg/dL — ABNORMAL HIGH (ref 0.61–1.24)
GFR calc Af Amer: 30 mL/min — ABNORMAL LOW (ref >60)
GFR calc non Af Amer: 26 mL/min — ABNORMAL LOW (ref >60)
Glucose, Bld: 169 mg/dL — ABNORMAL HIGH (ref 70–99)
Potassium: 4.9 mmol/L (ref 3.5–5.1)
Sodium: 137 mmol/L (ref 135–145)
Total Bilirubin: 0.6 mg/dL (ref 0.3–1.2)
Total Protein: 6.8 g/dL (ref 6.5–8.1)

## 2020-05-02 LAB — TSH: TSH: 2.437 u[IU]/mL (ref 0.350–4.500)

## 2020-05-02 LAB — URINALYSIS, ROUTINE W REFLEX MICROSCOPIC
Bilirubin Urine: NEGATIVE
Glucose, UA: NEGATIVE mg/dL
Hgb urine dipstick: NEGATIVE
Ketones, ur: NEGATIVE mg/dL
Leukocytes,Ua: NEGATIVE
Nitrite: NEGATIVE
Protein, ur: NEGATIVE mg/dL
Specific Gravity, Urine: 1.013 (ref 1.005–1.030)
pH: 5 (ref 5.0–8.0)

## 2020-05-02 LAB — HEPARIN LEVEL (UNFRACTIONATED)
Heparin Unfractionated: 0.82 IU/mL — ABNORMAL HIGH (ref 0.30–0.70)
Heparin Unfractionated: 0.98 IU/mL — ABNORMAL HIGH (ref 0.30–0.70)

## 2020-05-02 LAB — TROPONIN I (HIGH SENSITIVITY)
Troponin I (High Sensitivity): 228 ng/L (ref <18)
Troponin I (High Sensitivity): 579 ng/L (ref <18)
Troponin I (High Sensitivity): 763 ng/L (ref <18)
Troponin I (High Sensitivity): 554 ng/L (ref <18)

## 2020-05-02 LAB — CBG MONITORING, ED
Glucose-Capillary: 126 mg/dL — ABNORMAL HIGH (ref 70–99)
Glucose-Capillary: 209 mg/dL — ABNORMAL HIGH (ref 70–99)

## 2020-05-02 LAB — ECHOCARDIOGRAM COMPLETE
Height: 68 in
Weight: 4208 oz

## 2020-05-02 LAB — MAGNESIUM: Magnesium: 1.6 mg/dL — ABNORMAL LOW (ref 1.7–2.4)

## 2020-05-02 MED ORDER — ACETAMINOPHEN 650 MG RE SUPP: 650.0000 mg | Freq: Four times a day (QID) | RECTAL | Status: DC | PRN

## 2020-05-02 MED ORDER — HYDROCODONE-ACETAMINOPHEN 5-325 MG PO TABS: 1.0000 | ORAL_TABLET | ORAL | Status: DC | PRN

## 2020-05-02 MED ORDER — DILTIAZEM HCL-DEXTROSE 125-5 MG/125ML-% IV SOLN (PREMIX): 5.0000 mg/h | INTRAVENOUS | Status: DC

## 2020-05-02 MED ORDER — TORSEMIDE 20 MG PO TABS: 40.0000 mg | ORAL_TABLET | Freq: Every day | ORAL | Status: DC

## 2020-05-02 MED ORDER — APIXABAN 5 MG PO TABS
5.0000 mg | ORAL_TABLET | Freq: Two times a day (BID) | ORAL | Status: DC
  Administered 2020-05-02 – 2020-05-03 (×3): 5 mg via ORAL
  Filled 2020-05-02 (×3): qty 1

## 2020-05-02 MED ORDER — SODIUM CHLORIDE 0.9 % IV SOLN: INTRAVENOUS | Status: AC

## 2020-05-02 MED ORDER — DILTIAZEM HCL 30 MG PO TABS
30.0000 mg | ORAL_TABLET | Freq: Two times a day (BID) | ORAL | Status: DC
  Administered 2020-05-02: 30 mg via ORAL
  Filled 2020-05-02: qty 1

## 2020-05-02 MED ORDER — SPIRONOLACTONE 25 MG PO TABS: 25.0000 mg | ORAL_TABLET | Freq: Every day | ORAL | Status: DC

## 2020-05-02 MED ORDER — AMLODIPINE BESYLATE 5 MG PO TABS: 10.0000 mg | ORAL_TABLET | Freq: Every day | ORAL | Status: DC

## 2020-05-02 MED ORDER — DIAZEPAM 5 MG/ML IJ SOLN: 5.0000 mg | Freq: Four times a day (QID) | INTRAMUSCULAR | Status: DC | PRN

## 2020-05-02 MED ORDER — ACETAMINOPHEN 325 MG PO TABS: 650.0000 mg | ORAL_TABLET | Freq: Four times a day (QID) | ORAL | Status: DC | PRN

## 2020-05-02 MED ORDER — FERROUS SULFATE 325 (65 FE) MG PO TABS
325.0000 mg | ORAL_TABLET | Freq: Every day | ORAL | Status: DC
  Administered 2020-05-02 – 2020-05-03 (×2): 325 mg via ORAL
  Filled 2020-05-02 (×2): qty 1

## 2020-05-02 MED ORDER — INSULIN ASPART 100 UNIT/ML ~~LOC~~ SOLN
0.0000 [IU] | Freq: Three times a day (TID) | SUBCUTANEOUS | Status: DC
  Administered 2020-05-02: 8 [IU] via SUBCUTANEOUS
  Administered 2020-05-02: 5 [IU] via SUBCUTANEOUS
  Administered 2020-05-03: 3 [IU] via SUBCUTANEOUS
  Filled 2020-05-02: qty 1

## 2020-05-02 MED ORDER — ASPIRIN-DIPYRIDAMOLE ER 25-200 MG PO CP12
1.0000 | ORAL_CAPSULE | Freq: Two times a day (BID) | ORAL | Status: DC
  Filled 2020-05-02 (×7): qty 1

## 2020-05-02 MED ORDER — TAMSULOSIN HCL 0.4 MG PO CAPS
0.8000 mg | ORAL_CAPSULE | Freq: Every evening | ORAL | Status: DC
  Administered 2020-05-02: 0.8 mg via ORAL
  Filled 2020-05-02: qty 2

## 2020-05-02 MED ORDER — MAGNESIUM SULFATE 2 GM/50ML IV SOLN
2.0000 g | Freq: Once | INTRAVENOUS | Status: AC
  Administered 2020-05-02: 2 g via INTRAVENOUS
  Filled 2020-05-02: qty 50

## 2020-05-02 MED ORDER — INSULIN DETEMIR 100 UNIT/ML ~~LOC~~ SOLN
30.0000 [IU] | Freq: Every day | SUBCUTANEOUS | Status: DC
  Administered 2020-05-02: 30 [IU] via SUBCUTANEOUS
  Filled 2020-05-02 (×4): qty 0.3

## 2020-05-02 MED ORDER — TRAZODONE HCL 50 MG PO TABS
25.0000 mg | ORAL_TABLET | Freq: Every evening | ORAL | Status: DC | PRN
  Administered 2020-05-02: 25 mg via ORAL
  Filled 2020-05-02: qty 1

## 2020-05-02 MED ORDER — ATORVASTATIN CALCIUM 40 MG PO TABS
80.0000 mg | ORAL_TABLET | Freq: Every evening | ORAL | Status: DC
  Administered 2020-05-02: 80 mg via ORAL
  Filled 2020-05-02: qty 2

## 2020-05-02 MED ORDER — PERFLUTREN LIPID MICROSPHERE
1.0000 mL | INTRAVENOUS | Status: AC | PRN
  Administered 2020-05-02: 4 mL via INTRAVENOUS
  Filled 2020-05-02: qty 10

## 2020-05-02 MED ORDER — LOSARTAN POTASSIUM 25 MG PO TABS: 100.0000 mg | ORAL_TABLET | Freq: Every day | ORAL | Status: DC

## 2020-05-02 MED ORDER — HEPARIN BOLUS VIA INFUSION
5000.0000 [IU] | Freq: Once | INTRAVENOUS | Status: AC
  Administered 2020-05-02: 5000 [IU] via INTRAVENOUS

## 2020-05-02 MED ORDER — LACTATED RINGERS IV SOLN: INTRAVENOUS | Status: DC

## 2020-05-02 MED ORDER — POLYETHYLENE GLYCOL 3350 17 G PO PACK: 17.0000 g | PACK | Freq: Every day | ORAL | Status: DC | PRN

## 2020-05-02 MED ORDER — HEPARIN (PORCINE) 25000 UT/250ML-% IV SOLN
1400.0000 [IU]/h | INTRAVENOUS | Status: DC
  Administered 2020-05-02: 1500 [IU]/h via INTRAVENOUS
  Filled 2020-05-02: qty 250

## 2020-05-02 MED ORDER — ONDANSETRON HCL 4 MG/2ML IJ SOLN: 4.0000 mg | Freq: Four times a day (QID) | INTRAMUSCULAR | Status: DC | PRN

## 2020-05-02 MED ORDER — ONDANSETRON HCL 4 MG PO TABS: 4.0000 mg | ORAL_TABLET | Freq: Four times a day (QID) | ORAL | Status: DC | PRN

## 2020-05-02 MED ORDER — METOPROLOL TARTRATE 25 MG PO TABS
25.0000 mg | ORAL_TABLET | Freq: Two times a day (BID) | ORAL | Status: DC
  Filled 2020-05-02: qty 1

## 2020-05-02 MED ORDER — INSULIN ASPART 100 UNIT/ML ~~LOC~~ SOLN
0.0000 [IU] | Freq: Every day | SUBCUTANEOUS | Status: DC
  Administered 2020-05-02: 3 [IU] via SUBCUTANEOUS

## 2020-05-02 MED ORDER — METOPROLOL TARTRATE 25 MG PO TABS
37.5000 mg | ORAL_TABLET | Freq: Two times a day (BID) | ORAL | Status: DC
  Administered 2020-05-02 – 2020-05-03 (×2): 37.5 mg via ORAL
  Filled 2020-05-02 (×2): qty 2

## 2020-05-02 MED ORDER — POTASSIUM CHLORIDE 20 MEQ PO PACK
20.0000 meq | PACK | Freq: Once | ORAL | Status: AC
  Administered 2020-05-02: 20 meq via ORAL
  Filled 2020-05-02: qty 1

## 2020-05-02 NOTE — Progress Notes (Signed)
Per HPI: [name]  is a 79 y.o. male, with history of stroke, peripheral vascular disease, coronary artery disease, and more listed below presents to the ED with a chief complaint of muscle cramping.  Patient reports that the muscle cramping was sudden in onset between 5 and 7 PM on May 01, 2020.  Patient reports that this intensity is severe.  The cramping is all over his body but he names his legs, feet, and abdomen specifically.  He does not have cramping in his neck or his back.  He denies any fevers or chills.  He admits to shortness of breath from the pain.  Patient has not had any change in his urine habits.  He does admit that he is on a fluid pill with expected changes from that.  Patient reports that he does have loose stools secondary to Metformin.  He has 2-3 loose stools every day.  Patient reports that only the pain medication given in the ED has made his muscle cramping better.  Nothing has made it worse.  He has no other complaints at this time.  Of note patient has chronic unilateral, left, edema in his lower extremity.  -Patient has been admitted with new onset atrial fibrillation with RVR as well as elevated troponin levels and AKI.  He is heart rates have now stabilized and he is in sinus rhythm and off Cardizem drip since earlier this morning.  He has been seen by cardiology with recommendations to remain on Eliquis 5 mg twice daily for anticoagulation and increase his home metoprolol dose to 37.5 mg twice daily.  His 2D echocardiogram is unremarkable with LVEF 55-60% and no wall motion abnormalities.  TSH is 2.437.  He is overall asymptomatic and feeling much better.  Creatinine appears to be downtrending slightly, but will require further IV fluid for 1 more day in order to ensure further improvement prior to discharge.  It appears that much of this may be prerenal due to ongoing diarrhea related to his Metformin use.  He may need some medication adjustment based on his renal function  prior to discharge.  Anticipate discharge the following a.m. if renal function has further improved.  Urine electrolytes have also been obtained.  Total CARE time: 30 minutes.

## 2020-05-02 NOTE — Progress Notes (Signed)
ANTICOAGULATION CONSULT NOTE - Initial Consult  Pharmacy Consult for Heparin  Indication: atrial fibrillation  Allergies  Allergen Reactions  . Ivp Dye [Iodinated Diagnostic Agents] Rash    Patient Measurements: Height: 5\' 8"  (172.7 cm) Weight: 119.3 kg (263 lb) IBW/kg (Calculated) : 68.4  Vital Signs: Temp: 98.4 F (36.9 C) (07/01 1930) Temp Source: Oral (07/01 1930) BP: 146/43 (07/01 2230) Pulse Rate: 120 (07/01 2230)  Labs: Recent Labs    05/01/20 1919 05/01/20 2115  HGB 11.4*  --   HCT 36.3*  --   PLT 215  --   CREATININE 2.41*  --   TROPONINIHS 6 25*    Estimated Creatinine Clearance: 32.2 mL/min (A) (by C-G formula based on SCr of 2.41 mg/dL (H)).   Medical History: Past Medical History:  Diagnosis Date  . Anemia   . C. difficile diarrhea   . CKD (chronic kidney disease), stage III   . Coronary artery disease    Status post CABG in 2007 [facility] [place])  . Diabetes mellitus with stage 1 chronic kidney disease ([place]) 05/20/2011  . GERD (gastroesophageal reflux disease)   . Glaucoma   . Hyperlipidemia   . Hypertension   . Morbid obesity ([place])   . Peripheral vascular disease ([place])   . Sleep apnea   . Stroke [facility]) 2008, 2014, 2015    Assessment: 79 y/o M with new onset afib, no anti-coagulation PTA, Hgb 11.4, noted renal dysfunction, Hx CABG  Goal of Therapy:  Heparin level 0.3-0.7 units/ml Monitor platelets by anticoagulation protocol: Yes   Plan:  Heparin 5000 units BOLUS Start heparin drip at 1500 units/hr 0900 HL Daily CBC/HL Monitor for bleeding  Narda Bonds, PharmD, BCPS Clinical Pharmacist Phone: [phone]

## 2020-05-02 NOTE — Progress Notes (Signed)
  Echocardiogram 2D Echocardiogram has been performed with [place].  [name] 05/02/2020, 1:32 PM

## 2020-05-02 NOTE — ED Notes (Signed)
Date and time results received: 05/02/20 0501  Test: Troponin Critical Value: 579  Name of Provider Notified: Josph Macho, DO  Orders Received? Or Actions Taken?: acknowledged

## 2020-05-02 NOTE — Consult Note (Addendum)
Cardiology Consult    Patient ID: [name]; [id number]; May 22, 1941   Admit date: 05/01/2020 Date of Consult: 05/02/2020  Primary Care Provider: Alycia Rossetti, MD Primary Cardiologist: Rozann Lesches, MD   Patient Profile    [name] is a 79 y.o. male with past medical history of CAD (s/p CABG in 2007, NST in 07/2017 showing mild peri-infarct ischemia but overall low-risk), chronic diastolic CHF, HTN, HLD, Type 2 DM, prior CVA and Stage 3 CKD who is being seen today for the evaluation of atrial fibrillation with RVR and elevated troponin values at the request of Dr. Manuella Ghazi.   History of Present Illness    [name] was last examined by Dr. Domenic Polite in 01/2020 and reported improvement in his lower extremity edema since being switched from [facility] to [facility]. He was continued on his current medication regimen at that time including Aggrenox (continued by [place]), Atorvastatin 80 mg daily, Losartan 100mg  daily, Lopressor 25mg  BID, Spironolactone 25mg  daily and Torsemide 40mg  daily.   He initially presented to [facility] ED on 05/01/2020 for evaluation of muscle cramps. Initial labs showed WBC 13.3, Hgb 11.4, platelets 215, Na+ 139, K+ 3.3 and creatinine 2.41 (baseline 1.5 - 1.6). TSH 2.437. Mg 1.6. Initial HS Troponin 6 with repeat values of 25, 288, 579 and 763. CXR shows no acute abnormalities. EKG showed atrial fibrillation with RVR, HR 125 with inferior infarct pattern.   He was started on Heparin for anticoagulation and IV Cardizem for rate-control. He was initially in atrial fibrillation with RVR but converted to NSR around 2230 on 7/1 and has been in NSR since. He has been continued on Lopressor 25mg  BID with short-acting Cardizem 30mg  BID being initiated when discontinuation of IV Cardizem.   In talking with the patient and his wife today, he reports not being active at baseline and the longest distance he typically walks is to the mailbox.  He reports having baseline dyspnea on  exertion but denies any recent chest pain.  Over the past few weeks, he has noted his breathing was more labored and reports occasional palpitations but symptoms are typically very brief and he was asymptomatic from that perspective at the time of admission last night.  Past Medical History:  Diagnosis Date  . Anemia   . C. difficile diarrhea   . CKD (chronic kidney disease), stage III   . Coronary artery disease    Status post CABG in 2007 [facility] [place])  . Diabetes mellitus with stage 1 chronic kidney disease ([place]) 05/20/2011  . GERD (gastroesophageal reflux disease)   . Glaucoma   . Hyperlipidemia   . Hypertension   . Morbid obesity ([place])   . Peripheral vascular disease ([place])   . Sleep apnea   . Stroke [facility]) 2008, 2014, 2015    Past Surgical History:  Procedure Laterality Date  . CHOLECYSTECTOMY    . CORONARY ARTERY BYPASS GRAFT    . NO PAST SURGERIES    . THYROID SURGERY       Home Medications:  Prior to Admission medications   Medication Sig Start Date End Date Taking? Authorizing Provider  acetaminophen (TYLENOL) 650 MG CR tablet Take 1,300 mg by mouth every 8 (eight) hours as needed for pain.   Yes [provider]  amLODipine (NORVASC) 10 MG tablet Take 1 tablet (10 mg total) by mouth daily. 03/07/20  Yes [place], Modena Nunnery, MD  atorvastatin (LIPITOR) 80 MG tablet Take 80 mg by mouth  every evening.    Yes [provider]  clotrimazole-betamethasone (LOTRISONE) cream Apply 1 application topically 2 (two) times daily. Patient taking differently: Apply 1 application topically as needed.  12/02/17  Yes [place], Modena Nunnery, MD  Cyanocobalamin (VITAMIN B 12) 500 MCG TABS Take 1,000 mcg by mouth daily. 03/07/20  Yes [place], Modena Nunnery, MD  dipyridamole-aspirin (AGGRENOX) 200-25 MG 12hr capsule Take 1 capsule by mouth 2 (two) times daily. 03/07/20  Yes [place], Modena Nunnery, MD  ferrous sulfate 325 (65 FE) MG tablet Take 325 mg by mouth daily with breakfast.    Yes [provider]  insulin aspart protamine- aspart (NOVOLOG MIX 70/30) (70-30) 100 UNIT/ML injection Patient takes 44 units in the morning and 50 units at night. Patient taking differently: Inject 44-50 Units into the skin See admin instructions. Patient takes 44 units in the morning and 50 units at night. 03/12/20  Yes [place], Modena Nunnery, MD  losartan (COZAAR) 100 MG tablet Take 100 mg by mouth daily.     Yes [provider]  metFORMIN (GLUCOPHAGE) 1000 MG tablet Take 1,000 mg by mouth 2 (two) times daily with a meal.     Yes [provider]  metoprolol (LOPRESSOR) 50 MG tablet Take 0.5 tablets (25 mg total) by mouth 2 (two) times daily. FOR HEART/BLOOD PRESSURE. HOLD FOR SYSTOLIC BLOOD PRESSURE LESS THAN 1OO/HEART RATE LESS THAN 60 05/20/11  Yes Annita Brod, MD  potassium chloride (KLOR-CON) 10 MEQ tablet TAKE 2 TABLETS BY MOUTH ONCE DAILY WITH  FUROSEMIDE Patient taking differently: Take 10 mEq by mouth 2 (two) times daily.  12/31/19  Yes [place], Modena Nunnery, MD  spironolactone (ALDACTONE) 25 MG tablet Take 25 mg by mouth daily.   Yes [provider]  tamsulosin (FLOMAX) 0.4 MG CAPS capsule Take 2 capsules (0.8 mg total) by mouth every evening. 06/09/16  Yes [place], Modena Nunnery, MD  torsemide (DEMADEX) 20 MG tablet Take 2 tablets (40 mg total) by mouth daily. 11/13/19  Yes Satira Sark, MD    Inpatient Medications: Scheduled Meds: . atorvastatin  80 mg Oral QPM  . diltiazem  30 mg Oral Q12H  . dipyridamole-aspirin  1 capsule Oral BID  . ferrous sulfate  325 mg Oral Q breakfast  . insulin aspart  0-15 Units Subcutaneous TID WC  . insulin aspart  0-5 Units Subcutaneous QHS  . insulin detemir  30 Units Subcutaneous QHS  . metoprolol tartrate  25 mg Oral BID  . tamsulosin  0.8 mg Oral QPM   Continuous Infusions: . diltiazem (CARDIZEM) infusion Stopped (05/02/20 0835)  . heparin 1,400 Units/hr (05/02/20 0949)  . magnesium sulfate bolus IVPB     PRN  Meds: acetaminophen **OR** acetaminophen, diazepam, HYDROcodone-acetaminophen, ondansetron **OR** ondansetron (ZOFRAN) IV, polyethylene glycol, traZODone  Allergies:    Allergies  Allergen Reactions  . Ivp Dye [Iodinated Diagnostic Agents] Rash    Social History:   Social History   Socioeconomic History  . Marital status: Married    Spouse name: Not on file  . Number of children: Not on file  . Years of education: Not on file  . Highest education level: Not on file  Occupational History  . Not on file  Tobacco Use  . Smoking status: Former Smoker    Types: Cigarettes  . Smokeless tobacco: Never Used  Vaping Use  . Vaping Use: Never used  Substance and Sexual Activity  . Alcohol use: No  . Drug use: No  . Sexual activity: Not  Currently  Other Topics Concern  . Not on file  Social History Narrative  . Not on file   Social Determinants of Health   Financial Resource Strain:   . Difficulty of Paying Living Expenses:   Food Insecurity:   . Worried About Charity fundraiser in the Last Year:   . Arboriculturist in the Last Year:   Transportation Needs:   . Film/video editor (Medical):   [name] Lack of Transportation (Non-Medical):   Physical Activity:   . Days of Exercise per Week:   . Minutes of Exercise per Session:   Stress:   . Feeling of Stress :   Social Connections:   . Frequency of Communication with Friends and Family:   . Frequency of Social Gatherings with Friends and Family:   . Attends Religious Services:   . Active Member of Clubs or Organizations:   . Attends Archivist Meetings:   [name] Marital Status:   Intimate Partner Violence:   . Fear of Current or Ex-Partner:   . Emotionally Abused:   [name] Physically Abused:   . Sexually Abused:      Family History:    Family History  Problem Relation Age of Onset  . Diabetes Mother   . Heart failure Mother   . Hypertension Mother   . Diabetes Father   . Heart failure Father   . Hypertension  Father   . Diabetes Sister   . Heart failure Sister   . Hypertension Sister   . Hyperlipidemia Sister   . Diabetes Brother   . Heart failure Brother   . Hypertension Brother       Review of Systems    General:  No chills, fever, night sweats or weight changes. Positive for cramps.  Cardiovascular:  No chest pain, edema, orthopnea, palpitations, paroxysmal nocturnal dyspnea. Positive for dyspnea on exertion.  Dermatological: No rash, lesions/masses Respiratory: No cough, dyspnea Urologic: No hematuria, dysuria Abdominal:   No nausea, vomiting, diarrhea, bright red blood per rectum, melena, or hematemesis Neurologic:  No visual changes, wkns, changes in mental status. All other systems reviewed and are otherwise negative except as noted above.  Physical Exam/Data    Vitals:   05/02/20 0530 05/02/20 0600 05/02/20 1029 05/02/20 1059  BP: (!) 155/46 (!) 155/50 (!) 154/59 (!) 158/66  Pulse: 64 62 (!) 56 (!) 59  Resp:      Temp:      TempSrc:      SpO2: 99% 100% 99% 100%  Weight:      Height:        Intake/Output Summary (Last 24 hours) at 05/02/2020 1128 Last data filed at 05/01/2020 2146 Gross per 24 hour  Intake 11.08 ml  Output --  Net 11.08 ml   Filed Weights   05/01/20 1912  Weight: 119.3 kg   Body mass index is 39.99 kg/m.   General: Pleasant obese male appearing in NAD Psych: Normal affect. Neuro: Alert and oriented X 3. Moves all extremities spontaneously. HEENT: Normal  Neck: Supple without bruits or JVD. Lungs:  Resp regular and unlabored, CTA without wheezing or rales. Heart: RRR no s3, s4, or murmurs. Abdomen: Soft, non-tender, non-distended, BS + x 4.  Extremities: No clubbing, cyanosis or edema. DP/PT/Radials 2+ and equal bilaterally.   EKG:  The EKG was personally reviewed and demonstrates: Atrial fibrillation with RVR, HR 125 with inferior infarct pattern.   Telemetry:  Telemetry was personally reviewed and demonstrates: As above.  Labs/Studies     Relevant CV Studies:  NST: 07/2017  There was no ST segment deviation noted during stress.  The left ventricular ejection fraction is normal (55-65%).  Findings consistent with small mild prior apical myocardial infarction with mild peri-infarct ischemia.  This is a low risk study.  Echocardiogram: 06/2018 Study Conclusions   - Limited study to evaluate LV function.  - Left ventricle: The cavity size was normal. Wall thickness was  increased in a pattern of severe LVH. Systolic function was  normal. The estimated ejection fraction was in the range of 55%  to 60%.  - Aortic valve: Mildly calcified annulus. Mildly thickened  leaflets.  - Mitral valve: Mildly calcified annulus. Mildly thickened leaflets  .  - Technically difficult study. Echocontrast was used to enhance  visualization.   Laboratory Data:  Chemistry Recent Labs  Lab 05/01/20 1919 05/02/20 0311  NA 139 137  K 3.3* 4.9  CL 104 101  CO2 22 23  GLUCOSE 69* 169*  BUN 39* 42*  CREATININE 2.41* 2.31*  CALCIUM 8.8* 8.2*  GFRNONAA 25* 26*  GFRAA 29* 30*  ANIONGAP 13 13    Recent Labs  Lab 05/01/20 1919 05/02/20 0311  PROT 7.9 6.8  ALBUMIN 4.0 3.4*  AST 19 21  ALT 22 19  ALKPHOS 49 43  BILITOT 0.5 0.6   Hematology Recent Labs  Lab 05/01/20 1919 05/02/20 0311  WBC 13.3* 9.3  RBC 3.58* 3.38*  HGB 11.4* 10.5*  HCT 36.3* 33.7*  MCV 101.4* 99.7  MCH 31.8 31.1  MCHC 31.4 31.2  RDW 13.5 13.6  PLT 215 175   Cardiac EnzymesNo results for input(s): TROPONINI in the last 168 hours. No results for input(s): TROPIPOC in the last 168 hours.  BNPNo results for input(s): BNP, PROBNP in the last 168 hours.  DDimer No results for input(s): DDIMER in the last 168 hours.  Radiology/Studies:  DG Chest Port 1 View  Result Date: 05/01/2020 CLINICAL DATA:  Weakness.  Muscle cramps. EXAM: PORTABLE CHEST 1 VIEW COMPARISON:  10/28/2019 FINDINGS: Prior CABG. Tortuous thoracic  aorta. Heart size within normal limits for projection. The lungs appear clear. No blunting of the costophrenic angles. IMPRESSION: 1. No active cardiopulmonary disease is radiographically apparent. 2. Prior CABG. Electronically Signed   By: Van Clines M.D.   On: 05/01/2020 20:13     Assessment & Plan   1. New-Onset Atrial Fibrillation - He has a history of multiple CVA's but was unaware of any prior arrhythmias. He does report worsening dyspnea over the past several weeks and occasional palpitations but was asymptomatic from that perspective upon admission. He was initially in atrial fibrillation with RVR but converted to normal sinus rhythm while on IV Cardizem last night. K+ and Mg were low with replacement ordered. He has since maintained normal sinus rhythm. He is currently listed as getting Lopressor 25 mg twice daily along with short acting Cardizem 30 mg twice daily. Would anticipate consolidating to Lopressor with titration of dosing to 37.5 mg twice daily. Follow on telemetry.  - This patients CHA2DS2-VASc Score and unadjusted Ischemic Stroke Rate (% per year) is equal to 11.2 % stroke rate/year from a score of 7 (HTN, DM, Vascular, Age (2), CVA (2)). Currently on Heparin. Would plan to stop Aggrenox and switch to Eliquis 5mg  BID prior to discharge.   2. Elevated Troponin Values - Initial HS Troponin was 6 with repeat values of 25, 288, 579 and 763. Suspect this is secondary to demand  ischemia in the setting of atrial fibrillation with RVR. A repeat echocardiogram is pending to assess LV function and wall motion. He does have baseline dyspnea on exertion but denies any recent chest pain. Would not anticipate further ischemic testing at this time, especially in the setting of his AKI as he would be at high risk for contrast-induced nephropathy.  3. CAD - He is s/p CABG in 2007 with NST in 07/2017 showing mild peri-infarct ischemia but overall low-risk.  Will plan for repeat  echocardiogram as outlined above to assess LV function and wall motion. - Would anticipate discontinuing Aggrenox given initiation of anticoagulation as outlined above. Continue BB and statin therapy.   4. Chronic Diastolic CHF - He denies any recent orthopnea or PND. Utilizes CPAP at night. He is on Torsemide 40mg  daily which is currently held given his AKI. Repeat echo is pending.  5. HTN - BP has been variable at 113/39 - 158/66 while in the ED. He has been continued on Lopressor. Would anticipate restarting Amlodipine if consolidating to one AV nodal blocking agent as outlined above. Losartan and Spironolactone currently held given his AKI.   6. Acute on Chronic Stage 3 CKD - baseline creatinine 1.5 - 1.6. Elevated to 2.41 on admission, improving to 2.31 today.   For questions or updates, please contact [place] Please consult [email or web address] for contact info under Cardiology/STEMI.  Signed, Erma Heritage, PA-C 05/02/2020, 11:28 AM Pager: [phone]   Patient seen and discussed with PA Ahmed Prima, I agree with her documentation. 79 yo male history of chronic diastolic HF, CAD with prior CABG in 2007, CKD 3, DM, HL, HTN, OSA presented with body aches and muscle cramping. In Er found to be in afib with RVR and cardiology was consulted      WBC 13.3 Hgb 11.4 Plt 215 K 3.3 Cr 2.41 BUN 39 TSH 2.4 Mg 1.6 6--> 25--> 228--> 579-->763-->554--> CXR no acute process EKG afib RVR, no specific ischemic changes Echo pending  06/2018 echo: LVEF 55-60% 2018 nuclear stress: Findings consistent with small mild prior apical myocardial infarction with mild peri-infarct ischemia.   New diagnosis of afib, presented with RVR. Stated on dilt gtt, now on oral lopressor and oral dilt. Would simplify his regimen and just titrate up his lopressor, d/c oral dilt at this time. Convert hep to eliquis 5mg  bid, stop aggrenox  Troponin elevation in setting of afib with RVR. Suspect demand ischemia,  likely high demand in setting of chronic obstructive CAD, his last stress test did show some mild inferior ischemia. He has not had any chest pains or significant SOB. Do not suspect acute obstructive CAD. No ischemic testing is planned.    If no significant echo findings and rhythm holds likely can discharge later today from a purely cardiac standpoint.   Carlyle Dolly MD

## 2020-05-02 NOTE — Progress Notes (Signed)
ANTICOAGULATION CONSULT NOTE - Initial Consult  Pharmacy Consult for Heparin  Indication: atrial fibrillation  Allergies  Allergen Reactions  . Ivp Dye [Iodinated Diagnostic Agents] Rash    Patient Measurements: Height: 5\' 8"  (172.7 cm) Weight: 119.3 kg (263 lb) IBW/kg (Calculated) : 68.4  Vital Signs: BP: 155/50 (07/02 0600) Pulse Rate: 62 (07/02 0600)  Labs: Recent Labs    05/01/20 1919 05/01/20 2115 05/02/20 0036 05/02/20 0311 05/02/20 0841  HGB 11.4*  --   --  10.5*  --   HCT 36.3*  --   --  33.7*  --   PLT 215  --   --  175  --   HEPARINUNFRC  --   --   --   --  0.82*  CREATININE 2.41*  --   --  2.31*  --   TROPONINIHS 6   < > 228* 579* 763*   < > = values in this interval not displayed.    Estimated Creatinine Clearance: 33.6 mL/min (A) (by C-G formula based on SCr of 2.31 mg/dL (H)).   Assessment: 79 y/o M with new onset afib, no anti-coagulation PTA, Hgb 11.4, noted renal dysfunction, Hx CABG  05/02/20 0945 update Heparin level: 0.82 IU/mL,   above goal range on heparin at 1500 units/hr CBC: Hb 10.5 (BL 11.4)   Plates: 215>175 RN reports no bleeding issues or infusion complications at this time.  Goal of Therapy:  Heparin level 0.3-0.7 units/ml Monitor platelets by anticoagulation protocol: Yes   Plan:   Reduce heparin infusion to 1400 units/hr Re-check heparin level in 6-8 hrs Daily CBC/HL Monitor for signs and symptoms of  bleeding  Despina Pole, Pharm. D. Clinical Pharmacist 05/02/2020 9:53 AM

## 2020-05-02 NOTE — ED Notes (Signed)
Date and time results received: 05/02/20 0150  Test: Troponin Critical Value: 228  Name of Provider Notified: Zierle DO  Orders Received? Or Actions Taken?: acknowledged

## 2020-05-02 NOTE — Progress Notes (Signed)
Pharmacy Brief Note: apixaban education  Patient and his wife were educated about  apixaban and given handouts and coupons..  Thank You, Despina Pole, Pharm. D. Clinical Pharmacist 05/02/2020 3:08 PM

## 2020-05-02 NOTE — TOC Progression Note (Signed)
Transition of Care [facility]) - Progression Note    Patient Details  Name: [name] MRN: [id number] Date of Birth: 04-Aug-1941  [facility] CM/SW Contact  Boneta Lucks, RN Phone Number: 05/02/2020, 4:54 PM  Clinical Narrative:   [place] notification completed  [phone]      Barriers to Discharge: Continued Medical Work up

## 2020-05-02 NOTE — ED Notes (Signed)
Pt was given breakfast tray 

## 2020-05-02 NOTE — ED Notes (Signed)
Date and time results received: 05/02/20 0918 (use smartphrase ".now" to insert current time)  Test: troponin Critical Value:763  Name of Provider Notified: Dr. Manuella Ghazi  Orders Received? Or Actions Taken?: n/a

## 2020-05-02 NOTE — H&P (Signed)
TRH H&P    Patient Demographics:    [name], is a 79 y.o. male  MRN: [id number]  DOB - 1941/08/10  Admit Date - 05/01/2020  Referring MD/NP/PA: Dr. Roderic Palau  Outpatient Primary MD for the patient is [facility], Modena Nunnery, MD  Patient coming from: Home  Chief complaint-generalized body aches   HPI:    [name]  is a 79 y.o. male, with history of stroke, peripheral vascular disease, coronary artery disease, and more listed below presents to the ED with a chief complaint of muscle cramping.  Patient reports that the muscle cramping was sudden in onset between 49 and 7 PM on May 01, 2020.  Patient reports that this intensity is severe.  The cramping is all over his body but he names his legs, feet, and abdomen specifically.  He does not have cramping in his neck or his back.  He denies any fevers or chills.  He admits to shortness of breath from the pain.  Patient has not had any change in his urine habits.  He does admit that he is on a fluid pill with expected changes from that.  Patient reports that he does have loose stools secondary to Metformin.  He has 2-3 loose stools every day.  Patient reports that only the pain medication given in the ED has made his muscle cramping better.  Nothing has made it worse.  He has no other complaints at this time.  Of note patient has chronic unilateral, left, edema in his lower extremity.  ED highlights Patient was found to be in A. fib with RVR.  He was given a Cardizem bolus with no success.  He was started on a Cardizem drip at 10.  Cardizem drip was able to be weaned to 5 at the time of admission.  Patient had a chest x-ray that showed no acute cardiopulmonary disease.  Patient's initial Lance Bosch was 6.  Follow-up Lance Bosch was 25.  This is likely due to demand ischemia in the setting of A. fib with RVR.  Patient has a leukocytosis of 13.3.  He denies any infectious symptoms.  UA is  pending.  Patient was also found to have hypokalemia-potassium 3.3.  Likely secondary to 3 loose bowel movements daily.  Lastly patient was found to have an AKI.  Baseline creatinine is 1.5-1.6 today was 2.41.    Review of systems:    In addition to the HPI above,  No Fever-chills, No Headache, No changes with Vision or hearing, No problems swallowing food or Liquids, No Chest pain, Cough or Shortness of Breath, No Abdominal pain, No Nausea or Vomiting, bowel movements are regular, No Blood in stool or Urine, No dysuria, No new skin rashes or bruises, No new joints pains-aches,  New generalized weakness and muscle cramping No recent weight gain or loss, No polyuria, polydypsia or polyphagia, No significant Mental Stressors.  All other systems reviewed and are negative.    Past History of the following :    Past Medical History:  Diagnosis Date  . Anemia   .  C. difficile diarrhea   . CKD (chronic kidney disease), stage III   . Coronary artery disease    Status post CABG in 2007 [facility] [place])  . 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 GERD (gastroesophageal reflux disease)   . Glaucoma   . Hyperlipidemia   . Hypertension   . Morbid obesity ([place])   . Peripheral vascular disease ([place])   . Sleep apnea   . Stroke [facility]) 2008, 2014, 2015      Past Surgical History:  Procedure Laterality Date  . CHOLECYSTECTOMY    . CORONARY ARTERY BYPASS GRAFT    . NO PAST SURGERIES    . THYROID SURGERY        Social History:      Social History   Tobacco Use  . Smoking status: Former Smoker    Types: Cigarettes  . Smokeless tobacco: Never Used  Substance Use Topics  . Alcohol use: No       Family History :     Family History  Problem Relation Age of Onset  . Diabetes Mother   . Heart failure Mother   . Hypertension Mother   . Diabetes Father   . Heart failure Father   . Hypertension Father   . Diabetes Sister    . Heart failure Sister   . Hypertension Sister   . Hyperlipidemia Sister   . Diabetes Brother   . Heart failure Brother   . Hypertension Brother       Home Medications:   Prior to Admission medications   Medication Sig Start Date End Date Taking? Authorizing Provider  acetaminophen (TYLENOL) 650 MG CR tablet Take 1,300 mg by mouth every 8 (eight) hours as needed for pain.   Yes [provider]  amLODipine (NORVASC) 10 MG tablet Take 1 tablet (10 mg total) by mouth daily. 03/07/20  Yes [place], Modena Nunnery, MD  atorvastatin (LIPITOR) 80 MG tablet Take 80 mg by mouth every evening.    Yes [provider]  clotrimazole-betamethasone (LOTRISONE) cream Apply 1 application topically 2 (two) times daily. Patient taking differently: Apply 1 application topically as needed.  12/02/17  Yes [place], Modena Nunnery, MD  Cyanocobalamin (VITAMIN B 12) 500 MCG TABS Take 1,000 mcg by mouth daily. 03/07/20  Yes [place], Modena Nunnery, MD  dipyridamole-aspirin (AGGRENOX) 200-25 MG 12hr capsule Take 1 capsule by mouth 2 (two) times daily. 03/07/20  Yes [place], Modena Nunnery, MD  ferrous sulfate 325 (65 FE) MG tablet Take 325 mg by mouth daily with breakfast.   Yes [provider]  insulin aspart protamine- aspart (NOVOLOG MIX 70/30) (70-30) 100 UNIT/ML injection Patient takes 44 units in the morning and 50 units at night. Patient taking differently: Inject 44-50 Units into the skin See admin instructions. Patient takes 44 units in the morning and 50 units at night. 03/12/20  Yes , Modena Nunnery, MD  losartan (COZAAR) 100 MG tablet Take 100 mg by mouth daily.     Yes [provider]  metFORMIN (GLUCOPHAGE) 1000 MG tablet Take 1,000 mg by mouth 2 (two) times daily with a meal.     Yes [provider]  metoprolol (LOPRESSOR) 50 MG tablet Take 0.5 tablets (25 mg total) by mouth 2 (two) times daily. FOR HEART/BLOOD PRESSURE. HOLD FOR SYSTOLIC BLOOD PRESSURE LESS THAN 1OO/HEART RATE LESS THAN  60 05/20/11  Yes Annita Brod, MD  potassium chloride (KLOR-CON) 10 MEQ tablet TAKE 2 TABLETS BY  MOUTH ONCE DAILY WITH  FUROSEMIDE Patient taking differently: Take 10 mEq by mouth 2 (two) times daily.  12/31/19  Yes [place], Modena Nunnery, MD  spironolactone (ALDACTONE) 25 MG tablet Take 25 mg by mouth daily.   Yes [provider]  tamsulosin (FLOMAX) 0.4 MG CAPS capsule Take 2 capsules (0.8 mg total) by mouth every evening. 06/09/16  Yes [place], Modena Nunnery, MD  torsemide (DEMADEX) 20 MG tablet Take 2 tablets (40 mg total) by mouth daily. 11/13/19  Yes Satira Sark, MD     Allergies:     Allergies  Allergen Reactions  . Ivp Dye [Iodinated Diagnostic Agents] Rash     Physical Exam:   Vitals  Blood pressure (!) 146/43, pulse (!) 120, temperature 98.4 F (36.9 C), temperature source Oral, resp. rate 18, height 5\' 8"  (1.727 m), weight 119.3 kg, SpO2 94 %.  1.  General: Patient lying supine in bed in no acute distress  2. Psychiatric: Mood and behavior normal for situation  3. Neurologic: Cranial nerves II through XII are grossly intact, moves all 4 extremities voluntarily  4. HEENMT:  Head is atraumatic normocephalic, pupils are reactive to light, neck is supple, trachea is midline  5. Respiratory : Lungs are clear to auscultation bilaterally  6. Cardiovascular : Heart rate is tachycardic, and rhythm is irregularly irregular  7. Gastrointestinal:  Abdomen is soft, nondistended, nontender to palpation  8. Skin:  Skin is with out acute lesions on limited skin exam  9.Musculoskeletal:  Left leg has 2+ pitting edema.  This is chronic.  Vessel for CABG was taken from left leg.    Data Review:    CBC Recent Labs  Lab 05/01/20 1919  WBC 13.3*  HGB 11.4*  HCT 36.3*  PLT 215  MCV 101.4*  MCH 31.8  MCHC 31.4  RDW 13.5  LYMPHSABS 7.0*  MONOABS 1.4*  EOSABS 0.1  BASOSABS 0.1    ------------------------------------------------------------------------------------------------------------------  Results for orders placed or performed during the hospital encounter of 05/01/20 (from the past 48 hour(s))  CBC with Differential     Status: Abnormal   Collection Time: 05/01/20  7:19 PM  Result Value Ref Range   WBC 13.3 (H) 4.0 - 10.5 K/uL   RBC 3.58 (L) 4.22 - 5.81 MIL/uL   Hemoglobin 11.4 (L) 13.0 - 17.0 g/dL   HCT 36.3 (L) 39 - 52 %   MCV 101.4 (H) 80.0 - 100.0 fL   MCH 31.8 26.0 - 34.0 pg   MCHC 31.4 30.0 - 36.0 g/dL   RDW 13.5 11.5 - 15.5 %   Platelets 215 150 - 400 K/uL   nRBC 0.0 0.0 - 0.2 %   Neutrophils Relative % 35 %   Neutro Abs 4.7 1.7 - 7.7 K/uL   Lymphocytes Relative 52 %   Lymphs Abs 7.0 (H) 0.7 - 4.0 K/uL   Monocytes Relative 11 %   Monocytes Absolute 1.4 (H) 0 - 1 K/uL   Eosinophils Relative 1 %   Eosinophils Absolute 0.1 0 - 0 K/uL   Basophils Relative 0 %   Basophils Absolute 0.1 0 - 0 K/uL   Immature Granulocytes 1 %   Abs Immature Granulocytes 0.07 0.00 - 0.07 K/uL    Comment: Performed at [facility], [address], [place], [place] [address]  Comprehensive metabolic panel     Status: Abnormal   Collection Time: 05/01/20  7:19 PM  Result Value Ref Range   Sodium 139 135 - 145 mmol/L  Potassium 3.3 (L) 3.5 - 5.1 mmol/L   Chloride 104 98 - 111 mmol/L   CO2 22 22 - 32 mmol/L   Glucose, Bld 69 (L) 70 - 99 mg/dL    Comment: Glucose reference range applies only to samples taken after fasting for at least 8 hours.   BUN 39 (H) 8 - 23 mg/dL   Creatinine, Ser 2.41 (H) 0.61 - 1.24 mg/dL   Calcium 8.8 (L) 8.9 - 10.3 mg/dL   Total Protein 7.9 6.5 - 8.1 g/dL   Albumin 4.0 3.5 - 5.0 g/dL   AST 19 15 - 41 U/L   ALT 22 0 - 44 U/L   Alkaline Phosphatase 49 38 - 126 U/L   Total Bilirubin 0.5 0.3 - 1.2 mg/dL   GFR calc non Af Amer 25 (L) >60 mL/min   GFR calc Af Amer 29 (L) >60 mL/min   Anion gap 13 5 - 15    Comment: Performed at [facility], [address], [place], [place] [address]  Troponin I (High Sensitivity)     Status: None   Collection Time: 05/01/20  7:19 PM  Result Value Ref Range   Troponin I (High Sensitivity) 6 <18 ng/L    Comment: (NOTE) Elevated high sensitivity troponin I (hsTnI) values and significant  changes across serial measurements may suggest ACS but many other  chronic and acute conditions are known to elevate hsTnI results.  Refer to the "Links" section for chest pain algorithms and additional  guidance. Performed at [facility], [address], [place], [place] [address]   Troponin I (High Sensitivity)     Status: Abnormal   Collection Time: 05/01/20  9:15 PM  Result Value Ref Range   Troponin I (High Sensitivity) 25 (H) <18 ng/L    Comment: (NOTE) Elevated high sensitivity troponin I (hsTnI) values and significant  changes across serial measurements may suggest ACS but many other  chronic and acute conditions are known to elevate hsTnI results.  Refer to the "Links" section for chest pain algorithms and additional  guidance. Performed at [facility], [address], [place],  [address]     Chemistries  Recent Labs  Lab 05/01/20 1919  NA 139  K 3.3*  CL 104  CO2 22  GLUCOSE 69*  BUN 39*  CREATININE 2.41*  CALCIUM 8.8*  AST 19  ALT 22  ALKPHOS 49  BILITOT 0.5   ------------------------------------------------------------------------------------------------------------------  ------------------------------------------------------------------------------------------------------------------ GFR: Estimated Creatinine Clearance: 32.2 mL/min (A) (by C-G formula based on SCr of 2.41 mg/dL (H)). Liver Function Tests: Recent Labs  Lab 05/01/20 1919  AST 19  ALT 22  ALKPHOS 49  BILITOT 0.5  PROT 7.9  ALBUMIN 4.0   No results for input(s): LIPASE, AMYLASE in the last 168 hours. No results for input(s): AMMONIA in the last 168 hours. Coagulation Profile: No results  for input(s): INR, PROTIME in the last 168 hours. Cardiac Enzymes: No results for input(s): CKTOTAL, CKMB, CKMBINDEX, TROPONINI in the last 168 hours. BNP (last 3 results) No results for input(s): PROBNP in the last 8760 hours. HbA1C: No results for input(s): HGBA1C in the last 72 hours. CBG: No results for input(s): GLUCAP in the last 168 hours. Lipid Profile: No results for input(s): CHOL, HDL, LDLCALC, TRIG, CHOLHDL, LDLDIRECT in the last 72 hours. Thyroid Function Tests: No results for input(s): TSH, T4TOTAL, FREET4, T3FREE, THYROIDAB in the last 72 hours. Anemia Panel: No results for input(s): VITAMINB12, FOLATE, FERRITIN, TIBC, IRON, RETICCTPCT in the last  72 hours.  --------------------------------------------------------------------------------------------------------------- Urine analysis:    Component Value Date/Time   COLORURINE YELLOW 11/12/2019 1601   APPEARANCEUR CLEAR 11/12/2019 1601   LABSPEC 1.020 11/12/2019 1601   PHURINE 5.0 11/12/2019 1601   GLUCOSEU NEGATIVE 11/12/2019 1601   HGBUR NEGATIVE 11/12/2019 1601   BILIRUBINUR NEGATIVE 02/24/2018 1047   KETONESUR NEGATIVE 11/12/2019 1601   PROTEINUR NEGATIVE 11/12/2019 1601   UROBILINOGEN 0.2 03/28/2014 0145   NITRITE NEGATIVE 11/12/2019 1601   LEUKOCYTESUR NEGATIVE 11/12/2019 1601      Imaging Results:    DG Chest Port 1 View  Result Date: 05/01/2020 CLINICAL DATA:  Weakness.  Muscle cramps. EXAM: PORTABLE CHEST 1 VIEW COMPARISON:  10/28/2019 FINDINGS: Prior CABG. Tortuous thoracic aorta. Heart size within normal limits for projection. The lungs appear clear. No blunting of the costophrenic angles. IMPRESSION: 1. No active cardiopulmonary disease is radiographically apparent. 2. Prior CABG. Electronically Signed   By: Van Clines M.D.   On: 05/01/2020 20:13    My personal review of EKG: Rhythm atrial fibrillation with rapid ventricular response, Rate 125 /min, QTc 479 ,no Acute ST changes   Assessment  & Plan:    Active Problems:   Atrial fibrillation with rapid ventricular response ([place])   1. New onset A. fib with RVR 1. No history of A. Fib 2. Patient does have sleep apnea but reports compliance with CPAP 3. Chest x-ray shows no active disease 4. TSH pending 5. Echo pending 6. Initial troponin 6, repeat troponin 25, continue to trend 7. Cardizem bolus given in ED with no success 8. Cardizem drip started 9. Admit to ICU 10. Start p.o. Cardizem 11. Heparin protocol 12. Continue to monitor 2. Elevated troponin 1. Initial troponin 6, repeat troponin 25, continue to trend 2. Likely demand ischemia in the setting of rapid ventricular response 3. EKG does not show acute ischemic changes 4. Continue to monitor on bedside cardiac monitor 3. AKI 1. Creatinine baseline is 1.5-1.6 2. Creatinine today is 2.41 3. Prerenal in the setting of arrhythmia 4. Continue normal saline at 100 mils per hour 5. Recheck in the a.m. 4. Leukocytosis 1. Stress response most likely 2. UA to rule out asymptomatic UTI 3. Chest x-ray does not show any active disease 4. Patient complains of no infectious symptoms 5. Covid negative 6. Continue to monitor 5. Hypokalemia 1. Replace and recheck in the a.m. 2. Check magnesium as well in the a.m. 3. Likely secondary to multiple loose bowel movements per day 6. Diabetes mellitus type 2 1. Patient uses 70/30 insulin at home-44 units 2. Continue 30 units long-acting insulin here 3. Sliding scale coverage 4. Nightly coverage 5. Carb modified diet 6. Continue to monitor 7.    DVT Prophylaxis-   Heparin protocol- SCDs   AM Labs Ordered, also please review Full Orders  Family Communication: Admission, patients condition and plan of care including tests being ordered have been discussed with the patient and wife who indicate understanding and agree with the plan and Code Status.  Code Status: Full  Admission status: Inpatient :The appropriate admission  status for this patient is INPATIENT. Inpatient status is judged to be reasonable and necessary in order to provide the required intensity of service to ensure the patient's safety. The patient's presenting symptoms, physical exam findings, and initial radiographic and laboratory data in the context of their chronic comorbidities is felt to place them at high risk for further clinical deterioration. Furthermore, it is not anticipated that the patient will be medically stable for  discharge from the hospital within 2 midnights of admission. The following factors support the admission status of inpatient.     The patient's presenting symptoms include body aches The worrisome physical exam findings include irregularly irregular cardiac rhythm The initial radiographic and laboratory data are worrisome because of hypokalemia The chronic co-morbidities include: medical history listed above       * I certify that at the point of admission it is my clinical judgment that the patient will require inpatient hospital care spanning beyond 2 midnights from the point of admission due to high intensity of service, high risk for further deterioration and high frequency of surveillance required.*  Time spent in minutes : [place]

## 2020-05-03 LAB — CBC
HCT: 31 % — ABNORMAL LOW (ref 39.0–52.0)
Hemoglobin: 9.7 g/dL — ABNORMAL LOW (ref 13.0–17.0)
MCH: 31.3 pg (ref 26.0–34.0)
MCHC: 31.3 g/dL (ref 30.0–36.0)
MCV: 100 fL (ref 80.0–100.0)
Platelets: 161 10*3/uL (ref 150–400)
RBC: 3.1 MIL/uL — ABNORMAL LOW (ref 4.22–5.81)
RDW: 13.7 % (ref 11.5–15.5)
WBC: 5.5 10*3/uL (ref 4.0–10.5)
nRBC: 0 % (ref 0.0–0.2)

## 2020-05-03 LAB — BASIC METABOLIC PANEL
Anion gap: 8 (ref 5–15)
BUN: 26 mg/dL — ABNORMAL HIGH (ref 8–23)
CO2: 24 mmol/L (ref 22–32)
Calcium: 8.2 mg/dL — ABNORMAL LOW (ref 8.9–10.3)
Chloride: 108 mmol/L (ref 98–111)
Creatinine, Ser: 1.51 mg/dL — ABNORMAL HIGH (ref 0.61–1.24)
GFR calc Af Amer: 51 mL/min — ABNORMAL LOW (ref >60)
GFR calc non Af Amer: 44 mL/min — ABNORMAL LOW (ref >60)
Glucose, Bld: 196 mg/dL — ABNORMAL HIGH (ref 70–99)
Potassium: 4.4 mmol/L (ref 3.5–5.1)
Sodium: 140 mmol/L (ref 135–145)

## 2020-05-03 LAB — MAGNESIUM: Magnesium: 2 mg/dL (ref 1.7–2.4)

## 2020-05-03 LAB — GLUCOSE, CAPILLARY: Glucose-Capillary: 177 mg/dL — ABNORMAL HIGH (ref 70–99)

## 2020-05-03 MED ORDER — APIXABAN 5 MG PO TABS: 5.0000 mg | ORAL_TABLET | Freq: Two times a day (BID) | ORAL | 3 refills | Status: DC

## 2020-05-03 MED ORDER — METOPROLOL TARTRATE 37.5 MG PO TABS: 37.5000 mg | ORAL_TABLET | Freq: Two times a day (BID) | ORAL | 2 refills | Status: DC

## 2020-05-03 MED ORDER — TORSEMIDE 20 MG PO TABS: 20.0000 mg | ORAL_TABLET | Freq: Every day | ORAL | 2 refills | Status: DC

## 2020-05-03 NOTE — Discharge Instructions (Signed)

## 2020-05-03 NOTE — Progress Notes (Signed)
Nsg Discharge Note  Admit Date:  05/01/2020 Discharge date: 05/03/2020   [name] to be D/C'd Home per MD order.  AVS completed.  Patient able to verbalize understanding.  Discharge Medication: Allergies as of 05/03/2020      Reactions   Ivp Dye [iodinated Diagnostic Agents] Rash      Medication List    STOP taking these medications   amLODipine 10 MG tablet Commonly known as: NORVASC     TAKE these medications   acetaminophen 650 MG CR tablet Commonly known as: TYLENOL Take 1,300 mg by mouth every 8 (eight) hours as needed for pain.   apixaban 5 MG Tabs tablet Commonly known as: ELIQUIS Take 1 tablet (5 mg total) by mouth 2 (two) times daily.   atorvastatin 80 MG tablet Commonly known as: LIPITOR Take 80 mg by mouth every evening.   clotrimazole-betamethasone cream Commonly known as: Lotrisone Apply 1 application topically 2 (two) times daily. What changed:   when to take this  reasons to take this   dipyridamole-aspirin 200-25 MG 12hr capsule Commonly known as: AGGRENOX Take 1 capsule by mouth 2 (two) times daily.   ferrous sulfate 325 (65 FE) MG tablet Take 325 mg by mouth daily with breakfast.   insulin aspart protamine- aspart (70-30) 100 UNIT/ML injection Commonly known as: NOVOLOG MIX 70/30 Patient takes 44 units in the morning and 50 units at night. What changed:   how much to take  how to take this  when to take this   losartan 100 MG tablet Commonly known as: COZAAR Take 100 mg by mouth daily.   metFORMIN 1000 MG tablet Commonly known as: GLUCOPHAGE Take 1,000 mg by mouth 2 (two) times daily with a meal.   Metoprolol Tartrate 37.5 MG Tabs Take 37.5 mg by mouth 2 (two) times daily. What changed:   medication strength  how much to take  additional instructions   potassium chloride 10 MEQ tablet Commonly known as: KLOR-CON TAKE 2 TABLETS BY MOUTH ONCE DAILY WITH  FUROSEMIDE What changed:   how much to take  how to take  this  when to take this  additional instructions   spironolactone 25 MG tablet Commonly known as: ALDACTONE Take 25 mg by mouth daily.   tamsulosin 0.4 MG Caps capsule Commonly known as: FLOMAX Take 2 capsules (0.8 mg total) by mouth every evening.   torsemide 20 MG tablet Commonly known as: Demadex Take 1 tablet (20 mg total) by mouth daily. May take extra 20mg  tablet daily if weight gain of 3lbs or more noted in 24 hour period. What changed:   how much to take  additional instructions   Vitamin B 12 500 MCG Tabs Take 1,000 mcg by mouth daily.       Discharge Assessment: Vitals:   05/03/20 0022 05/03/20 0518  BP: (!) 135/54 128/69  Pulse: (!) 59 62  Resp: 18 16  Temp: 99.3 F (37.4 C) 99.1 F (37.3 C)  SpO2: 99% 98%   Skin clean, dry and intact without evidence of skin break down, no evidence of skin tears noted. IV catheter discontinued intact. Site without signs and symptoms of complications - no redness or edema noted at insertion site, patient denies c/o pain - only slight tenderness at site.  Dressing with slight pressure applied.  D/c Instructions-Education: Discharge instructions given to patient/family with verbalized understanding. D/c education completed with patient/family including follow up instructions, medication list, d/c activities limitations if indicated, with other d/c instructions as  indicated by MD - patient able to verbalize understanding, all questions fully answered. Patient instructed to return to ED, call 911, or call MD for any changes in condition.  Patient escorted via [place], and D/C home via private auto.  Durwood Dittus C, RN 05/03/2020 11:20 AM

## 2020-05-03 NOTE — Discharge Summary (Signed)
Physician Discharge Summary  [name] NTI:[id number] DOB: 12-12-1940 DOA: 05/01/2020  PCP: Alycia Rossetti, MD  Admit date: 05/01/2020  Discharge date: 05/03/2020  Admitted From:Home  Disposition:  Home  Recommendations for Outpatient Follow-up:  1. Follow up with PCP in 1-2 weeks 2. Please obtain BMP/CBC in one week 3. Follow-up with cardiology as scheduled on 7/30 4. Continue on Eliquis 5 mg twice daily as prescribed for stroke prophylaxis in setting of A. Fib 5. Continue now on metoprolol 37.5 mg twice daily for heart rate control 6. Continue on Demadex 20 mg instead of 40 mg daily and use extra 20 mg as needed for weight gain of 3 pounds in 24-hour period 7. Continue home losartan, but hold amlodipine 8. Continue home spironolactone as prior  Home Health: None  Equipment/Devices: None  Discharge Condition: Stable  CODE STATUS: Full  Diet recommendation: Heart Healthy/carb modified  Brief/Interim Summary: Per HPI: [name],with history of stroke, peripheral vascular disease, coronary artery disease, and more listed below presents to the ED with a chief complaint of muscle cramping. Patient reports that the muscle cramping was sudden in onset between 71 and 7 PM on May 01, 2020. Patient reports that this intensity is severe. The cramping is all over his body but he names his legs, feet, and abdomen specifically. He does not have cramping in his neck or his back. He denies any fevers or chills. He admits to shortness of breath from the pain. Patient has not had any change in his urine habits. He does admit that he is on a fluid pill with expected changes from that. Patient reports that he does have loose stools secondary to Metformin. He has 2-3 loose stools every day. Patient reports that only the pain medication given in the ED has made his muscle cramping better. Nothing has made it worse. He has no other complaints at this time.  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  -Patient has been admitted with new onset atrial fibrillation with RVR as well as elevated troponin levels and AKI.  He is heart rates have now stabilized and he is in sinus rhythm and off Cardizem drip since earlier this morning.  He has been seen by cardiology with recommendations to remain on Eliquis 5 mg twice daily for anticoagulation and increase his home metoprolol dose to 37.5 mg twice daily.  His 2D echocardiogram is unremarkable with LVEF 55-60% and no wall motion abnormalities.  TSH is 2.437.  He is overall asymptomatic and feeling much better.  His AKI has resolved and his creatinine is back to baseline after IV fluid hydration.  I have recommended that patient continue his home Aldactone, but reduce his torsemide dose to 20 mg daily and use an extra 20 mg as needed for weight gain of 3 pounds in a 24-hour period.  He is to continue his other medications as prior.  No other acute events or issues noted throughout the course of this admission and he is ready for discharge today with close follow-up arranged with cardiology on 7/30.  Discharge Diagnoses:  Active Problems:   Atrial fibrillation with rapid ventricular response (HCC)  Principal discharge diagnosis: New onset atrial fibrillation with RVR and associated elevated troponin due to demand ischemia.  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  Discharge Instructions  Discharge Instructions    Diet - low sodium heart healthy   Complete by: As directed    Increase activity slowly  Complete by: As directed      Allergies as of 05/03/2020      Reactions   Ivp Dye [iodinated Diagnostic Agents] Rash      Medication List    STOP taking these medications   amLODipine 10 MG tablet Commonly known as: NORVASC     TAKE these medications   acetaminophen 650 MG CR tablet Commonly known as: TYLENOL Take 1,300 mg by mouth every 8 (eight) hours as needed for pain.    apixaban 5 MG Tabs tablet Commonly known as: ELIQUIS Take 1 tablet (5 mg total) by mouth 2 (two) times daily.   atorvastatin 80 MG tablet Commonly known as: LIPITOR Take 80 mg by mouth every evening.   clotrimazole-betamethasone cream Commonly known as: Lotrisone Apply 1 application topically 2 (two) times daily. What changed:   when to take this  reasons to take this   dipyridamole-aspirin 200-25 MG 12hr capsule Commonly known as: AGGRENOX Take 1 capsule by mouth 2 (two) times daily.   ferrous sulfate 325 (65 FE) MG tablet Take 325 mg by mouth daily with breakfast.   insulin aspart protamine- aspart (70-30) 100 UNIT/ML injection Commonly known as: NOVOLOG MIX 70/30 Patient takes 44 units in the morning and 50 units at night. What changed:   how much to take  how to take this  when to take this   losartan 100 MG tablet Commonly known as: COZAAR Take 100 mg by mouth daily.   metFORMIN 1000 MG tablet Commonly known as: GLUCOPHAGE Take 1,000 mg by mouth 2 (two) times daily with a meal.   Metoprolol Tartrate 37.5 MG Tabs Take 37.5 mg by mouth 2 (two) times daily. What changed:   medication strength  how much to take  additional instructions   potassium chloride 10 MEQ tablet Commonly known as: KLOR-CON TAKE 2 TABLETS BY MOUTH ONCE DAILY WITH  FUROSEMIDE What changed:   how much to take  how to take this  when to take this  additional instructions   spironolactone 25 MG tablet Commonly known as: ALDACTONE Take 25 mg by mouth daily.   tamsulosin 0.4 MG Caps capsule Commonly known as: FLOMAX Take 2 capsules (0.8 mg total) by mouth every evening.   torsemide 20 MG tablet Commonly known as: Demadex Take 1 tablet (20 mg total) by mouth daily. May take extra 20mg  tablet daily if weight gain of 3lbs or more noted in 24 hour period. What changed:   how much to take  additional instructions   Vitamin B 12 500 MCG Tabs Take 1,000 mcg by mouth  daily.       Follow-up Information    Erma Heritage, PA-C Follow up on 05/30/2020.   Specialties: Physician Assistant, Cardiology Why: [facility] Follow-up on 05/30/2020 at 2:00 PM.  Contact information: [place] [address] [phone]        Alycia Rossetti, MD Follow up in 1 week(s).   Specialty: Family Medicine Contact information: [address], [address] [place] [address] [phone]              Allergies  Allergen Reactions  . Ivp Dye [Iodinated Diagnostic Agents] Rash    Consultations:  Cardiology   Procedures/Studies: DG Chest Port 1 View  Result Date: 05/01/2020 CLINICAL DATA:  Weakness.  Muscle cramps. EXAM: PORTABLE CHEST 1 VIEW COMPARISON:  10/28/2019 FINDINGS: Prior CABG. Tortuous thoracic aorta. Heart size within normal limits for projection. The lungs appear clear. No blunting  of the costophrenic angles. IMPRESSION: 1. No active cardiopulmonary disease is radiographically apparent. 2. Prior CABG. Electronically Signed   By: Van Clines M.D.   On: 05/01/2020 20:13   ECHOCARDIOGRAM COMPLETE  Result Date: 05/02/2020    ECHOCARDIOGRAM REPORT   Patient Name:   RYDER MAN Date of Exam: 05/02/2020 Medical Rec #:  [id number]     Height:       68.0 in Accession #:    [id number]    Weight:       263.0 lb Date of Birth:  Aug 29, 1941      BSA:          2.296 m Patient Age:    37 years      BP:           128/72 mmHg Patient Gender: M             HR:           60 bpm. Exam Location:  [facility] Procedure: 2D Echo, Cardiac Doppler, Color Doppler and Intracardiac            Opacification Agent Indications:    Atrial fibrillation  History:        Patient has prior history of Echocardiogram examinations, most                 recent 06/07/2018. CHF, CAD, Prior CABG; Risk                 Factors:Hypertension, Diabetes, Dyslipidemia, Sleep Apnea and                 Former Smoker. H/o stroke. CKD.  Sonographer:    Clayton Lefort RDCS (AE)  Referring Phys: [id number] ASIA B [place]  Sonographer Comments: Technically challenging study due to limited acoustic windows, Technically difficult study due to poor echo windows, suboptimal apical window, suboptimal subcostal window and patient is morbidly obese. Image acquisition challenging due to patient body habitus. IMPRESSIONS  1. Left ventricular ejection fraction, by estimation, is 55 to 60%. The left ventricle has normal function. The left ventricle has no regional wall motion abnormalities. There is severe left ventricular hypertrophy. Left ventricular diastolic parameters  are indeterminate.  2. Right ventricular systolic function is normal. The right ventricular size is normal.  3. Left atrial size was upper normal.  4. The mitral valve is abnormal, mildly calcified and with annular calcification. Trivial mitral valve regurgitation.  5. The aortic valve is tricuspid. Aortic valve regurgitation is not visualized. Mild to moderate aortic valve sclerosis/calcification is present, without any evidence of aortic stenosis.  6. Unable to estimate CVP. FINDINGS  Left Ventricle: Left ventricular ejection fraction, by estimation, is 55 to 60%. The left ventricle has normal function. The left ventricle has no regional wall motion abnormalities. Definity contrast agent was given IV to delineate the left ventricular  endocardial borders. The left ventricular internal cavity size was normal in size. There is severe left ventricular hypertrophy. Left ventricular diastolic parameters are indeterminate. Right Ventricle: The right ventricular size is normal. No increase in right ventricular wall thickness. Right ventricular systolic function is normal. Left Atrium: Left atrial size was upper normal. Right Atrium: Right atrial size was normal in size. Pericardium: There is no evidence of pericardial effusion. Mitral Valve: The mitral valve is abnormal. There is mild calcification of the mitral valve leaflet(s).  Moderate mitral annular calcification. Trivial mitral valve regurgitation. Tricuspid Valve: The tricuspid valve is grossly normal. Tricuspid valve regurgitation is mild. Aortic  Valve: The aortic valve is tricuspid. Aortic valve regurgitation is not visualized. Mild to moderate aortic valve sclerosis/calcification is present, without any evidence of aortic stenosis. Mild aortic valve annular calcification. Aortic valve mean gradient measures 2.2 mmHg. Aortic valve peak gradient measures 4.4 mmHg. Aortic valve area, by VTI measures 2.89 cm. Pulmonic Valve: The pulmonic valve was grossly normal. Pulmonic valve regurgitation is trivial. Aorta: The aortic root is normal in size and structure. Venous: Unable to estimate CVP. The inferior vena cava was not well visualized. IAS/Shunts: The interatrial septum was not well visualized.  LEFT VENTRICLE PLAX 2D LVIDd:         4.39 cm LVIDs:         3.45 cm LV PW:         1.62 cm LV IVS:        1.82 cm LVOT diam:     2.30 cm LV SV:         64 LV SV Index:   28 LVOT Area:     4.15 cm  LEFT ATRIUM             Index LA diam:        4.30 cm 1.87 cm/m LA Vol (A2C):   77.7 ml 33.84 ml/m LA Vol (A4C):   69.9 ml 30.44 ml/m LA Biplane Vol: 77.5 ml 33.75 ml/m  AORTIC VALVE AV Area (Vmax):    3.04 cm AV Area (Vmean):   2.79 cm AV Area (VTI):     2.89 cm AV Vmax:           105.40 cm/s AV Vmean:          69.600 cm/s AV VTI:            0.220 m AV Peak Grad:      4.4 mmHg AV Mean Grad:      2.2 mmHg LVOT Vmax:         77.23 cm/s LVOT Vmean:        46.667 cm/s LVOT VTI:          0.153 m LVOT/AV VTI ratio: 0.70  AORTA Ao Root diam: 3.60 cm Ao Asc diam:  3.10 cm TRICUSPID VALVE TR Peak grad:   23.6 mmHg TR Vmax:        243.00 cm/s  SHUNTS Systemic VTI:  0.15 m Systemic Diam: 2.30 cm Rozann Lesches MD Electronically signed by Rozann Lesches MD Signature Date/Time: 05/02/2020/2:24:14 PM    Final       Subjective:   Discharge Exam: Vitals:   05/03/20 0022 05/03/20 0518  BP: (!)  135/54 128/69  Pulse: (!) 59 62  Resp: 18 16  Temp: 99.3 F (37.4 C) 99.1 F (37.3 C)  SpO2: 99% 98%   Vitals:   05/02/20 1645 05/02/20 2045 05/03/20 0022 05/03/20 0518  BP: (!) 161/87 132/64 (!) 135/54 128/69  Pulse: 66 70 (!) 59 62  Resp: 18 20 18 16   Temp: 98.7 F (37.1 C) 98.4 F (36.9 C) 99.3 F (37.4 C) 99.1 F (37.3 C)  TempSrc:  Oral Oral Oral  SpO2: 100% 99% 99% 98%  Weight: 122.3 kg     Height: 5\' 8"  (1.727 m)       General: Pt is alert, awake, not in acute distress Cardiovascular: RRR, S1/S2 +, no rubs, no gallops Respiratory: CTA bilaterally, no wheezing, no rhonchi Abdominal: Soft, NT, ND, bowel sounds + Extremities: no edema, no cyanosis    The results of significant diagnostics from this  hospitalization (including imaging, microbiology, ancillary and laboratory) are listed below for reference.     Microbiology: No results found for this or any previous visit (from the past 240 hour(s)).   Labs: BNP (last 3 results) Recent Labs    06/08/19 1131 10/28/19 1229  BNP 174* 19.1   Basic Metabolic Panel: Recent Labs  Lab 05/01/20 1919 05/02/20 0311 05/03/20 0602  NA 139 137 140  K 3.3* 4.9 4.4  CL 104 101 108  CO2 22 23 24   GLUCOSE 69* 169* 196*  BUN 39* 42* 26*  CREATININE 2.41* 2.31* 1.51*  CALCIUM 8.8* 8.2* 8.2*  MG  --  1.6* 2.0   Liver Function Tests: Recent Labs  Lab 05/01/20 1919 05/02/20 0311  AST 19 21  ALT 22 19  ALKPHOS 49 43  BILITOT 0.5 0.6  PROT 7.9 6.8  ALBUMIN 4.0 3.4*   No results for input(s): LIPASE, AMYLASE in the last 168 hours. No results for input(s): AMMONIA in the last 168 hours. CBC: Recent Labs  Lab 05/01/20 1919 05/02/20 0311 05/03/20 0602  WBC 13.3* 9.3 5.5  NEUTROABS 4.7 6.5  --   HGB 11.4* 10.5* 9.7*  HCT 36.3* 33.7* 31.0*  MCV 101.4* 99.7 100.0  PLT 215 175 161   Cardiac Enzymes: No results for input(s): CKTOTAL, CKMB, CKMBINDEX, TROPONINI in the last 168 hours. BNP: Invalid input(s):  POCBNP CBG: Recent Labs  Lab 05/02/20 0827 05/02/20 1655 05/02/20 2035 05/02/20 2216 05/03/20 0756  GLUCAP 209* 261* 294* 277* 177*   D-Dimer No results for input(s): DDIMER in the last 72 hours. Hgb A1c No results for input(s): HGBA1C in the last 72 hours. Lipid Profile No results for input(s): CHOL, HDL, LDLCALC, TRIG, CHOLHDL, LDLDIRECT in the last 72 hours. Thyroid function studies Recent Labs    05/01/20 1919  TSH 2.437   Anemia work up No results for input(s): VITAMINB12, FOLATE, FERRITIN, TIBC, IRON, RETICCTPCT in the last 72 hours. Urinalysis    Component Value Date/Time   COLORURINE YELLOW 05/02/2020 0019   APPEARANCEUR CLEAR 05/02/2020 0019   LABSPEC 1.013 05/02/2020 0019   PHURINE 5.0 05/02/2020 0019   GLUCOSEU NEGATIVE 05/02/2020 0019   HGBUR NEGATIVE 05/02/2020 0019   BILIRUBINUR NEGATIVE 05/02/2020 0019   KETONESUR NEGATIVE 05/02/2020 0019   PROTEINUR NEGATIVE 05/02/2020 0019   UROBILINOGEN 0.2 03/28/2014 0145   NITRITE NEGATIVE 05/02/2020 0019   LEUKOCYTESUR NEGATIVE 05/02/2020 0019   Sepsis Labs Invalid input(s): PROCALCITONIN,  WBC,  LACTICIDVEN Microbiology No results found for this or any previous visit (from the past 240 hour(s)).   Time coordinating discharge: 35 minutes  SIGNED:   Rodena Goldmann, DO [facility] 05/03/2020, 11:01 AM  If 7PM-7AM, please contact night-coverage [email or web address]

## 2020-05-09 ENCOUNTER — Encounter: Payer: Self-pay | Admitting: Student

## 2020-05-09 ENCOUNTER — Ambulatory Visit (INDEPENDENT_AMBULATORY_CARE_PROVIDER_SITE_OTHER): Payer: Medicare Other | Admitting: Student

## 2020-05-09 ENCOUNTER — Other Ambulatory Visit: Payer: Self-pay | Admitting: *Deleted

## 2020-05-09 ENCOUNTER — Other Ambulatory Visit: Payer: Self-pay

## 2020-05-09 VITALS — BP 124/62 | HR 78 | Ht 68.0 in | Wt 269.4 lb

## 2020-05-09 DIAGNOSIS — R0609 Other forms of dyspnea: Secondary | ICD-10-CM

## 2020-05-09 DIAGNOSIS — I480 Paroxysmal atrial fibrillation: Secondary | ICD-10-CM

## 2020-05-09 DIAGNOSIS — I10 Essential (primary) hypertension: Secondary | ICD-10-CM

## 2020-05-09 DIAGNOSIS — I25118 Atherosclerotic heart disease of native coronary artery with other forms of angina pectoris: Secondary | ICD-10-CM | POA: Diagnosis not present

## 2020-05-09 DIAGNOSIS — R0600 Dyspnea, unspecified: Secondary | ICD-10-CM

## 2020-05-09 DIAGNOSIS — I5032 Chronic diastolic (congestive) heart failure: Secondary | ICD-10-CM

## 2020-05-09 DIAGNOSIS — N183 Chronic kidney disease, stage 3 unspecified: Secondary | ICD-10-CM

## 2020-05-09 MED ORDER — TORSEMIDE 20 MG PO TABS: 20.0000 mg | ORAL_TABLET | Freq: Every day | ORAL | 3 refills | Status: DC

## 2020-05-09 MED ORDER — APIXABAN 5 MG PO TABS: 5.0000 mg | ORAL_TABLET | Freq: Two times a day (BID) | ORAL | 3 refills | Status: DC

## 2020-05-09 MED ORDER — FERROUS SULFATE 325 (65 FE) MG PO TABS: 325.0000 mg | ORAL_TABLET | Freq: Every day | ORAL | 3 refills | Status: DC

## 2020-05-09 NOTE — Patient Instructions (Signed)
Medication Instructions:   Stop Aggrenox.   Labwork:  None  Testing/Procedures:  Lexiscan Myoview Stress Test  Follow-Up:  With Dr. Domenic Polite in 3-4 months.   Any Other Special Instructions Will Be Listed Below (If Applicable).     If you need a refill on your cardiac medications before your next appointment, please call your pharmacy.

## 2020-05-09 NOTE — Progress Notes (Signed)
Cardiology Office Note    Date:  05/10/2020   ID:  [name], [name], [name], MRN [id number]  PCP:  Alycia Rossetti, MD  Cardiologist: Rozann Lesches, MD    Chief Complaint  Patient presents with   Hospitalization Follow-up    History of Present Illness:    [name] is a 79 y.o. male with past medical history of CAD (s/p CABG in 2007, NST in 07/2017 showing mild peri-infarct ischemia but overall low-risk), chronic diastolic CHF, HTN, HLD, Type 2 DM, prior CVA and Stage 3 CKD who presents to the office today for hospital follow-up.   He had presented to [facility] ED on 05/01/2020 for evaluation of muscle cramps and was found to be in atrial fibrillation with RVR which was a new diagnosis for him. Electrolytes and TSH were WNL. HS Troponin values peaked at 763 and this was thought to be secondary to demand ischemia in the setting of his arrhythmia. He was started on IV Cardizem and converted to NSR the evening of admission. Repeat echo showed a preserved EF of 55-60% with severe LVH. The following morning, he continued to maintain NSR. Lopressor was titrated to 37.5mg  BID and he was started on Eliquis 5mg  BID and it was recommended to stop Aggrenox.   In talking with the patient today, he was overall unaware of his arrhythmia during admission but denies any recent chest pain or palpitations. His initial presentation had been for muscle cramps and he denies any recurrent symptoms. No reports of orthopnea, PND or lower extremity edema. He did continue with Aggrenox following hospital discharge as he was unaware to stop this and denies any recent melena, hematochezia or hematuria.  The patient and his wife mention that he has experienced worsening dyspnea on exertion over the past several months and he is unsure if this is possibly cardiac or due to his COPD. He was anemic during admission and reports previously being on iron supplementation but discontinued this a few months back due  to running out of the medication.   Past Medical History:  Diagnosis Date   Anemia    C. difficile diarrhea    CKD (chronic kidney disease), stage III    Coronary artery disease    Status post CABG in 2007 [facility])   Diabetes mellitus with stage 1 chronic kidney disease ([place]) 05/20/2011   GERD (gastroesophageal reflux disease)    Glaucoma    Hyperlipidemia    Hypertension    Morbid obesity ([place])    PAF (paroxysmal atrial fibrillation) ([place])    Peripheral vascular disease ([place])    Sleep apnea    Stroke ([place]) 2008, 2014, 2015    Past Surgical History:  Procedure Laterality Date   CHOLECYSTECTOMY     CORONARY ARTERY BYPASS GRAFT     NO PAST SURGERIES     THYROID SURGERY      Current Medications: Outpatient Medications Prior to Visit  Medication Sig Dispense Refill   acetaminophen (TYLENOL) 650 MG CR tablet Take 1,300 mg by mouth every 8 (eight) hours as needed for pain.     atorvastatin (LIPITOR) 80 MG tablet Take 80 mg by mouth every evening.      clotrimazole-betamethasone (LOTRISONE) cream Apply 1 application topically 2 (two) times daily. (Patient taking differently: Apply 1 application topically as needed. ) 30 g 1   Cyanocobalamin (VITAMIN B 12) 500 MCG TABS Take 1,000 mcg by mouth daily. 180 tablet 1   insulin  aspart protamine- aspart (NOVOLOG MIX 70/30) (70-30) 100 UNIT/ML injection Patient takes 44 units in the morning and 50 units at night. (Patient taking differently: Inject 44-50 Units into the skin See admin instructions. Patient takes 44 units in the morning and 50 units at night.) 10 mL 2   losartan (COZAAR) 100 MG tablet Take 100 mg by mouth daily.       metFORMIN (GLUCOPHAGE) 1000 MG tablet Take 1,000 mg by mouth 2 (two) times daily with a meal.       metoprolol tartrate 37.5 MG TABS Take 37.5 mg by mouth 2 (two) times daily. 60 tablet 2   potassium chloride (KLOR-CON) 10 MEQ tablet TAKE 2 TABLETS BY MOUTH ONCE  DAILY WITH  FUROSEMIDE (Patient taking differently: Take 10 mEq by mouth 2 (two) times daily. ) 180 tablet 1   spironolactone (ALDACTONE) 25 MG tablet Take 25 mg by mouth daily.     tamsulosin (FLOMAX) 0.4 MG CAPS capsule Take 2 capsules (0.8 mg total) by mouth every evening. 60 capsule 3   apixaban (ELIQUIS) 5 MG TABS tablet Take 1 tablet (5 mg total) by mouth 2 (two) times daily. 60 tablet 3   dipyridamole-aspirin (AGGRENOX) 200-25 MG 12hr capsule Take 1 capsule by mouth 2 (two) times daily. 180 capsule 1   ferrous sulfate 325 (65 FE) MG tablet Take 325 mg by mouth daily with breakfast.     torsemide (DEMADEX) 20 MG tablet Take 1 tablet (20 mg total) by mouth daily. May take extra 20mg  tablet daily if weight gain of 3lbs or more noted in 24 hour period. 30 tablet 2   No facility-administered medications prior to visit.     Allergies:   Ivp dye [iodinated diagnostic agents]   Social History   Socioeconomic History   Marital status: Married    Spouse name: Not on file   Number of children: Not on file   Years of education: Not on file   Highest education level: Not on file  Occupational History   Not on file  Tobacco Use   Smoking status: Former Smoker    Types: Cigarettes   Smokeless tobacco: Never Used  Scientific laboratory technician Use: Never used  Substance and Sexual Activity   Alcohol use: No   Drug use: No   Sexual activity: Not Currently  Other Topics Concern   Not on file  Social History Narrative   Not on file   Social Determinants of Health   Financial Resource Strain:    Difficulty of Paying Living Expenses:   Food Insecurity:    Worried About Charity fundraiser in the Last Year:    Arboriculturist in the Last Year:   Transportation Needs:    Film/video editor (Medical):    Lack of Transportation (Non-Medical):   Physical Activity:    Days of Exercise per Week:    Minutes of Exercise per Session:   Stress:    Feeling of Stress :    Social Connections:    Frequency of Communication with Friends and Family:    Frequency of Social Gatherings with Friends and Family:    Attends Religious Services:    Active Member of Clubs or Organizations:    Attends Archivist Meetings:    Marital Status:      Family History:  The patient's family history includes Diabetes in his brother, father, mother, and sister; Heart failure in his brother, father, mother, and sister; Hyperlipidemia in  his sister; Hypertension in his brother, father, mother, and sister.   Review of Systems:   Please see the history of present illness.     General:  No chills, fever, night sweats or weight changes.  Cardiovascular:  No chest pain, edema, orthopnea, palpitations, paroxysmal nocturnal dyspnea. Positive for dyspnea on exertion.  Dermatological: No rash, lesions/masses Respiratory: No cough, dyspnea Urologic: No hematuria, dysuria Abdominal:   No nausea, vomiting, diarrhea, bright red blood per rectum, melena, or hematemesis Neurologic:  No visual changes, wkns, changes in mental status. All other systems reviewed and are otherwise negative except as noted above.   Physical Exam:    VS:  BP 124/62    Pulse 78    Ht 5\' 8"  (1.727 m)    Wt 269 lb 6.4 oz (122.2 kg)    SpO2 97%    BMI 40.96 kg/m    General: Well developed, well nourished,male appearing in no acute distress. Head: Normocephalic, atraumatic, sclera non-icteric.  Neck: No carotid bruits. JVD not elevated.  Lungs: Respirations regular and unlabored, without wheezes or rales.  Heart: Regular rate and rhythm. No S3 or S4.  No murmur, no rubs, or gallops appreciated. Abdomen: Soft, non-tender, non-distended. No obvious abdominal masses. Msk:  Strength and tone appear normal for age. No obvious joint deformities or effusions. Extremities: No clubbing or cyanosis. Trace lower extremity edema bilaterally.  Distal pedal pulses are 2+ bilaterally. Neuro: Alert and oriented X  3. Moves all extremities spontaneously. No focal deficits noted. Psych:  Responds to questions appropriately with a normal affect. Skin: No rashes or lesions noted  Wt Readings from Last 3 Encounters:  [name]/09/21 269 lb 6.4 oz (122.2 kg)  [name]/02/21 269 lb 11.2 oz (122.3 kg)  05/[name]/21 273 lb (123.8 kg)     Studies/Labs Reviewed:   EKG:  EKG is not ordered today.   Recent Labs: 10/28/2019: B Natriuretic Peptide 66.0 05/01/2020: TSH 2.437 05/02/2020: ALT 19 05/03/2020: BUN 26; Creatinine, Ser 1.51; Hemoglobin 9.7; Magnesium 2.0; Platelets 161; Potassium 4.4; Sodium 140   Lipid Panel    Component Value Date/Time   CHOL 154 05/[name]/2021 1158   TRIG 118 05/[name]/2021 1158   HDL 35 (L) 05/[name]/2021 1158   CHOLHDL 4.4 05/[name]/2021 1158   VLDL 18 10/11/2016 1042   LDLCALC 98 05/[name]/2021 1158    Additional studies/ records that were reviewed today include:   NST: 07/2017  There was no ST segment deviation noted during stress.  The left ventricular ejection fraction is normal (55-65%).  Findings consistent with small mild prior apical myocardial infarction with mild peri-infarct ischemia.  This is a low risk study.  Echocardiogram: [name]/2021 IMPRESSIONS    1. Left ventricular ejection fraction, by estimation, is 55 to 60%. The  left ventricle has normal function. The left ventricle has no regional  wall motion abnormalities. There is severe left ventricular hypertrophy.  Left ventricular diastolic parameters  are indeterminate.  2. Right ventricular systolic function is normal. The right ventricular  size is normal.  3. Left atrial size was upper normal.  4. The mitral valve is abnormal, mildly calcified and with annular  calcification. Trivial mitral valve regurgitation.  5. The aortic valve is tricuspid. Aortic valve regurgitation is not  visualized. Mild to moderate aortic valve sclerosis/calcification is  present, without any evidence of aortic stenosis.  6. Unable to estimate  CVP.   Assessment:    1. Coronary artery disease of native artery of native heart with stable angina pectoris ([place])  2. Dyspnea on exertion   3. PAF (paroxysmal atrial fibrillation) ([place])   4. Chronic diastolic CHF (congestive heart failure) ([place])   5. Essential hypertension   6. Stage 3 chronic kidney disease, unspecified whether stage 3a or 3b CKD      Plan:   In order of problems listed above:  1. CAD/Dyspnea on Exertion - He is s/p CABG in 2007 with NST in 07/2017 showing mild peri-infarct ischemia but overall low-risk. HS Troponin values did peak at 763 during his recent admission and felt to be secondary to demand ischemia in the setting of atrial fibrillation with RVR.  - He reports progressive dyspnea on exertion over the past few months and we reviewed this could be multifactorial in the setting of his CAD, COPD and anemia. Given he was asymptomatic prior to CABG, will obtain a [place] for ischemic evaluation. He was not wheezing on examination today but I did encourage him to bring his inhaler the day of his stress test. I also recommended he restart his iron supplementation given his anemia (Hgb at 9.7 earlier this month). - Continue statin and BB therapy. Not on ASA given the need for anticoagulation and I did recommend he stop Aggrenox as outlined below.   2. Paroxysmal Atrial Fibrillation - He was diagnosed with atrial fibrillation during his recent admission and converted to NSR while on IV Cardizem. Maintaining NSR by examination today.  - Lopressor was titrated to 37.5mg  BID during his recent admission but they are unsure if he is taking as prescribed or at 25mg  BID. I encouraged them to make [place] aware of his current dose.  - He is on Eliquis 5mg  BID. Aggrenox was discontinued during his admission given the initiation of anticoagulation but he has remained on this. Recommended he stop Aggrenox.   3. Chronic Diastolic CHF - He denies any orthopnea, PND or edema  and only has trace edema on examination today. Continue Torsemide 20mg  daily. We did review limiting fluid intake to < 2 L daily.   4. HTN - BP is well-controlled at 124/62 during today's visit. Continue current medication regimen with Losartan 100mg  daily, Lopressor 25mg  BID and Spironolactone 25mg  daily.   5. Stage 3 CKD - Baseline creatinine 1.4 - 1.5. Elevated to 2.41 during recent admission, improved to 1.51 at the time of discharge.    Medication Adjustments/Labs and Tests Ordered: Current medicines are reviewed at length with the patient today.  Concerns regarding medicines are outlined above.  Medication changes, Labs and Tests ordered today are listed in the Patient Instructions below. Patient Instructions  Medication Instructions:   Stop Aggrenox.   Labwork:  None  Testing/Procedures:  Lexiscan Myoview Stress Test  Follow-Up:  With Dr. Domenic Polite in 3-4 months.   Any Other Special Instructions Will Be Listed Below (If Applicable).     If you need a refill on your cardiac medications before your next appointment, please call your pharmacy.      Signed, Erma Heritage, PA-C  05/10/2020 7:58 AM    [facility] [address] [place], [place] [address] Phone: [phone] Fax: [phone]

## 2020-05-09 NOTE — Patient Outreach (Signed)
[place] [facility]) Care Management  05/09/2020  LEX LINHARES 1941/05/27 [id number]   RED ON EMMI ALERT - General Discharge Day # 4 Date: 7/8 Red Alert Reason: Unfilled prescriptions   Outreach attempt #1, unsuccessful, HIPAA compliant voice message left.   Plan: RN CM will send unsuccessful outreach letter and follow up within the next 3-4 business days.  Valente David, [place], MSN [place] [phone]

## 2020-05-10 ENCOUNTER — Encounter: Payer: Self-pay | Admitting: Student

## 2020-05-14 ENCOUNTER — Other Ambulatory Visit: Payer: Self-pay | Admitting: *Deleted

## 2020-05-14 NOTE — Patient Outreach (Signed)
[facility] [facility]) Care Management  05/14/2020  [name] 02-15-1941 [id number]   RED ON EMMI ALERT - General Discharge Day # 4 Date: 7/8 Red Alert Reason: Unfilled prescriptions   Outreach attempt #2, unsuccessful, HIPAA compliant voice message left.  Call received back from wife, [name], report member does not answer the phone all the time and he does not like the recorded calls.  She state he is more receptive to speaking to a person directly.  She is not in the home with him at this time but provides this care manager his cell phone number.  She confirms that he attend his cardiology appointment last week and will have another visit/testing done tomorrow.  This care manager will follow up with member directly on Friday after testing/visit is complete.  Valente David, [place], MSN [place] [phone]

## 2020-05-15 ENCOUNTER — Other Ambulatory Visit: Payer: Self-pay

## 2020-05-15 ENCOUNTER — Encounter (HOSPITAL_COMMUNITY): Payer: Self-pay

## 2020-05-15 ENCOUNTER — Ambulatory Visit (HOSPITAL_COMMUNITY)
Admission: RE | Admit: 2020-05-15 | Discharge: 2020-05-15 | Disposition: A | Discharge status: Home / Self Care | Payer: Medicare Other | Source: Ambulatory Visit | Attending: Student | Admitting: Student

## 2020-05-15 ENCOUNTER — Ambulatory Visit (HOSPITAL_BASED_OUTPATIENT_CLINIC_OR_DEPARTMENT_OTHER)
Admission: RE | Admit: 2020-05-15 | Discharge: 2020-05-15 | Disposition: A | Discharge status: Home / Self Care | Payer: Medicare Other | Source: Ambulatory Visit | Attending: Student | Admitting: Student

## 2020-05-15 DIAGNOSIS — I25118 Atherosclerotic heart disease of native coronary artery with other forms of angina pectoris: Secondary | ICD-10-CM | POA: Diagnosis not present

## 2020-05-15 DIAGNOSIS — R0609 Other forms of dyspnea: Secondary | ICD-10-CM

## 2020-05-15 DIAGNOSIS — R0600 Dyspnea, unspecified: Secondary | ICD-10-CM

## 2020-05-15 LAB — NM MYOCAR MULTI W/SPECT W/WALL MOTION / EF
LV dias vol: 84 mL (ref 62–150)
LV sys vol: 43 mL
Peak HR: 77 {beats}/min
RATE: 0.61
Rest HR: 55 {beats}/min
SDS: 4
SRS: 2
SSS: 6
TID: 0.92

## 2020-05-15 MED ORDER — REGADENOSON 0.4 MG/5ML IV SOLN
INTRAVENOUS | Status: AC
  Administered 2020-05-15: 0.4 mg via INTRAVENOUS
  Filled 2020-05-15: qty 5

## 2020-05-15 MED ORDER — TECHNETIUM TC 99M TETROFOSMIN IV KIT
30.0000 | PACK | Freq: Once | INTRAVENOUS | Status: AC | PRN
  Administered 2020-05-15: 32 via INTRAVENOUS

## 2020-05-15 MED ORDER — SODIUM CHLORIDE FLUSH 0.9 % IV SOLN
INTRAVENOUS | Status: AC
  Administered 2020-05-15: 10 mL via INTRAVENOUS
  Filled 2020-05-15: qty 10

## 2020-05-15 MED ORDER — TECHNETIUM TC 99M TETROFOSMIN IV KIT
10.0000 | PACK | Freq: Once | INTRAVENOUS | Status: AC | PRN
  Administered 2020-05-15: 10.5 via INTRAVENOUS

## 2020-05-16 ENCOUNTER — Other Ambulatory Visit: Payer: Self-pay | Admitting: *Deleted

## 2020-05-16 NOTE — Patient Outreach (Signed)
[facility]) Care Management  05/16/2020  [name] April 02, 1941 [id number]    RED ON EMMI ALERT- General Discharge Day #4 Date:7/8 Red Alert Reason:Unfilled prescriptions   Outreach attempt #2, successful, identity verified.  This care manager introduced self and stated purpose of call. [facility] care management services explained.  He report he is doing well and doesn't need any additional services.  Benefits explained, including education and help with management of care considering new A-fib diagnosis, he again declines involvement.  Will close case at this time.  Valente David, [place], MSN [place] [phone]

## 2020-05-30 ENCOUNTER — Ambulatory Visit: Payer: Medicare Other | Admitting: Student

## 2020-07-08 ENCOUNTER — Ambulatory Visit (INDEPENDENT_AMBULATORY_CARE_PROVIDER_SITE_OTHER): Payer: Medicare Other | Admitting: Family Medicine

## 2020-07-08 ENCOUNTER — Encounter: Payer: Self-pay | Admitting: Family Medicine

## 2020-07-08 ENCOUNTER — Other Ambulatory Visit: Payer: Self-pay

## 2020-07-08 VITALS — BP 146/74 | HR 62 | Temp 98.2°F | Resp 16 | Wt 269.0 lb

## 2020-07-08 DIAGNOSIS — Z23 Encounter for immunization: Secondary | ICD-10-CM

## 2020-07-08 DIAGNOSIS — E1122 Type 2 diabetes mellitus with diabetic chronic kidney disease: Secondary | ICD-10-CM | POA: Diagnosis not present

## 2020-07-08 DIAGNOSIS — Z794 Long term (current) use of insulin: Secondary | ICD-10-CM | POA: Diagnosis not present

## 2020-07-08 DIAGNOSIS — N183 Chronic kidney disease, stage 3 unspecified: Secondary | ICD-10-CM | POA: Diagnosis not present

## 2020-07-08 DIAGNOSIS — B353 Tinea pedis: Secondary | ICD-10-CM

## 2020-07-08 DIAGNOSIS — L84 Corns and callosities: Secondary | ICD-10-CM

## 2020-07-08 DIAGNOSIS — I5032 Chronic diastolic (congestive) heart failure: Secondary | ICD-10-CM

## 2020-07-08 MED ORDER — TERBINAFINE HCL 1 % EX CREA
1.0000 | TOPICAL_CREAM | Freq: Two times a day (BID) | CUTANEOUS | 0 refills | Status: DC
Start: 2020-07-08 — End: 2020-08-16

## 2020-07-08 NOTE — Assessment & Plan Note (Signed)
Increase demadex to 40mg  daily x 1 week, continue potassium They were not sure if they had the aldactone, will check at home Elevate feet, reduce sodium intake Monitor weights Recheck in 1 week

## 2020-07-08 NOTE — Assessment & Plan Note (Signed)
A1C above goal, needs to stay below 8% Check level today  Continue 70/30 mix, MTF

## 2020-07-08 NOTE — Assessment & Plan Note (Signed)
lamisil cream given Use corn/callus pad for pre ulcerative lesion He has podiatrist at [facility]

## 2020-07-08 NOTE — Progress Notes (Signed)
Subjective:    Patient ID: [name], male    DOB: January 17, 1941, 79 y.o.   MRN: [id number]  Patient presents for Follow-up (is not fasting)  Pt here to f/u chronic medical problems Since our last visit he was admitted to hospital with A fib with RVR in setting of his heart failure. Reviewed last cardiology note, now on  on metoprolol 37.5mg  BID, and eliquis 5mg  BID, [facility] EL[facility]IS   CHF- weight up, state he was down about 8 pounds, he increased fluid pill to 40mg  x 3 days, but not much change in swelling No chest pain no SOB  DM - last A1C 8.2% in May, states CBG have been okay, did not bring meter  CBG this 204 , yesterday  103  Metformin, 70/30, taking  40mg  in morning and  50 units in evening   He has a sore on his Right foot noticed 2 days ago and between the toes  Review Of Systems:  GEN- denies fatigue, fever, weight loss,weakness, recent illness HEENT- denies eye drainage, change in vision, nasal discharge, CVS- denies chest pain, palpitations RESP- denies SOB, cough, wheeze ABD- denies N/V, change in stools, abd pain GU- denies dysuria, hematuria, dribbling, incontinence MSK- denies joint pain, muscle aches, injury Neuro- denies headache, dizziness, syncope, seizure activity       Objective:    BP (!) 146/74   Pulse 62   Temp 98.2 F (36.8 C)   Resp 16   Wt 269 lb (122 kg)   SpO2 98%   BMI 40.90 kg/m  GEN- NAD, alert and oriented x3 HEENT- PERRL, EOMI, non injected sclera, pink conjunctiva, MMM, oropharynx clear Neck- Supple, no thyromegaly CVS- RRR, no murmur RESP-CTAB ABD-NABS,soft,NT,ND EXT- 1+ pitting  Edema Skin- maceration with mild erythema in web spaces bilat, cracked skin, small preulcerative callus at base of 4th digit on right foot, mild TTP  Pulses- Radial, DP- diminished        Assessment & Plan:      Problem List Items Addressed This Visit      Unprioritized   Chronic diastolic heart failure (HCC)     Increase demadex to 40mg  daily x 1 week, continue potassium They were not sure if they had the aldactone, will check at home Elevate feet, reduce sodium intake Monitor weights Recheck in 1 week      Relevant Medications   amLODipine (NORVASC) 10 MG tablet   Other Relevant Orders   CBC with Differential/Platelet   Comprehensive metabolic panel   CKD stage 3 due to type 2 diabetes mellitus (HCC)   Relevant Orders   Comprehensive metabolic panel   DM (diabetes mellitus) type II controlled with renal manifestation (HCC)    A1C above goal, needs to stay below 8% Check level today  Continue 70/30 mix, MTF      Relevant Orders   Hemoglobin A1c   Tinea pedis    lamisil cream given Use corn/callus pad for pre ulcerative lesion He has podiatrist at [facility]      Relevant Medications   terbinafine (LAMISIL AT) 1 % cream    Other Visit Diagnoses    Need for immunization against influenza    -  Primary   Relevant Orders   Flu Vaccine QUAD High Dose(Fluad) (Completed)   Pre-ulcerative calluses          Note: This dictation was prepared with Dragon dictation along with smaller phrase technology. Any transcriptional errors that result  from this process are unintentional.

## 2020-07-08 NOTE — Patient Instructions (Addendum)
Check for spironolactone 25mg  or aldactone  Increase Demadex to  40mg  once a day for 1 week  F/U 1 week for recheck

## 2020-07-09 LAB — COMPREHENSIVE METABOLIC PANEL
AG Ratio: 1.3 (calc) (ref 1.0–2.5)
ALT: 17 U/L (ref 9–46)
AST: 16 U/L (ref 10–35)
Albumin: 3.9 g/dL (ref 3.6–5.1)
Alkaline phosphatase (APISO): 49 U/L (ref 35–144)
BUN/Creatinine Ratio: 17 (calc) (ref 6–22)
BUN: 30 mg/dL — ABNORMAL HIGH (ref 7–25)
CO2: 27 mmol/L (ref 20–32)
Calcium: 9.2 mg/dL (ref 8.6–10.3)
Chloride: 104 mmol/L (ref 98–110)
Creat: 1.73 mg/dL — ABNORMAL HIGH (ref 0.70–1.18)
Globulin: 3.1 g/dL (calc) (ref 1.9–3.7)
Glucose, Bld: 124 mg/dL — ABNORMAL HIGH (ref 65–99)
Potassium: 4.5 mmol/L (ref 3.5–5.3)
Sodium: 140 mmol/L (ref 135–146)
Total Bilirubin: 0.4 mg/dL (ref 0.2–1.2)
Total Protein: 7 g/dL (ref 6.1–8.1)

## 2020-07-09 LAB — CBC WITH DIFFERENTIAL/PLATELET
Absolute Monocytes: 746 cells/uL (ref 200–950)
Basophils Absolute: 20 cells/uL (ref 0–200)
Basophils Relative: 0.3 %
Eosinophils Absolute: 59 cells/uL (ref 15–500)
Eosinophils Relative: 0.9 %
HCT: 34.5 % — ABNORMAL LOW (ref 38.5–50.0)
Hemoglobin: 11.5 g/dL — ABNORMAL LOW (ref 13.2–17.1)
Lymphs Abs: 2350 cells/uL (ref 850–3900)
MCH: 31.4 pg (ref 27.0–33.0)
MCHC: 33.3 g/dL (ref 32.0–36.0)
MCV: 94.3 fL (ref 80.0–100.0)
MPV: 10.4 fL (ref 7.5–12.5)
Monocytes Relative: 11.3 %
Neutro Abs: 3425 cells/uL (ref 1500–7800)
Neutrophils Relative %: 51.9 %
Platelets: 187 10*3/uL (ref 140–400)
RBC: 3.66 10*6/uL — ABNORMAL LOW (ref 4.20–5.80)
RDW: 12.9 % (ref 11.0–15.0)
Total Lymphocyte: 35.6 %
WBC: 6.6 10*3/uL (ref 3.8–10.8)

## 2020-07-09 LAB — HEMOGLOBIN A1C
Hgb A1c MFr Bld: 8.3 % of total Hgb — ABNORMAL HIGH (ref <5.7)
Mean Plasma Glucose: 192 (calc)
eAG (mmol/L): 10.6 (calc)

## 2020-07-10 ENCOUNTER — Other Ambulatory Visit: Payer: Self-pay | Admitting: *Deleted

## 2020-07-10 MED ORDER — INSULIN ASPART PROT & ASPART (70-30 MIX) 100 UNIT/ML ~~LOC~~ SUSP: SUBCUTANEOUS | Status: DC

## 2020-07-15 ENCOUNTER — Other Ambulatory Visit: Payer: Self-pay

## 2020-07-15 ENCOUNTER — Encounter: Payer: Self-pay | Admitting: Family Medicine

## 2020-07-15 ENCOUNTER — Ambulatory Visit (INDEPENDENT_AMBULATORY_CARE_PROVIDER_SITE_OTHER): Payer: Medicare Other | Admitting: Family Medicine

## 2020-07-15 VITALS — BP 142/60 | HR 66 | Temp 98.7°F | Resp 14 | Ht 68.0 in | Wt 269.0 lb

## 2020-07-15 DIAGNOSIS — B353 Tinea pedis: Secondary | ICD-10-CM | POA: Diagnosis not present

## 2020-07-15 DIAGNOSIS — I5032 Chronic diastolic (congestive) heart failure: Secondary | ICD-10-CM

## 2020-07-15 DIAGNOSIS — N1832 Chronic kidney disease, stage 3b: Secondary | ICD-10-CM | POA: Diagnosis not present

## 2020-07-15 DIAGNOSIS — R609 Edema, unspecified: Secondary | ICD-10-CM

## 2020-07-15 LAB — BASIC METABOLIC PANEL WITH GFR
BUN/Creatinine Ratio: 22 (calc) (ref 6–22)
BUN: 36 mg/dL — ABNORMAL HIGH (ref 7–25)
CO2: 23 mmol/L (ref 20–32)
Calcium: 9.2 mg/dL (ref 8.6–10.3)
Chloride: 106 mmol/L (ref 98–110)
Creat: 1.64 mg/dL — ABNORMAL HIGH (ref 0.70–1.18)
GFR, Est African American: 46 mL/min/{1.73_m2} — ABNORMAL LOW (ref >=60)
GFR, Est Non African American: 40 mL/min/{1.73_m2} — ABNORMAL LOW (ref >=60)
Glucose, Bld: 194 mg/dL — ABNORMAL HIGH (ref 65–99)
Potassium: 4.5 mmol/L (ref 3.5–5.3)
Sodium: 138 mmol/L (ref 135–146)

## 2020-07-15 NOTE — Patient Instructions (Addendum)
We will call with lab results Keep taking the Demadex 20mg  once a day  Spironolactone 25mg  once a day  ( Aldactone) F/U 3 months

## 2020-07-15 NOTE — Progress Notes (Signed)
   Subjective:    Patient ID: [name], male    DOB: 21-Aug-1941, 79 y.o.   MRN: [id number]  Patient presents for Follow-up (edema)  Patient here for 1 week follow-up on lower extremity edema.  His Demadex was increased to 40 mg daily for a total of a week.  He is now back on 20 mg daily.  He denies any chest pain or new shortness of breath.  States his weight went down 5 pounds on his scale at home he was 262 pounds this morning.  His wife states he continues to eat foods with a lot of sodium in them has been eating red hot-dogs.  Tinea pedis they have been using the Lamisil he has another sore spot on the left foot.  While I was doing examination today he was noted to have a crack in the skin with some bleeding.  There is no pus expressed.  Regarding the callus on the right foot near using corn and callus pad the pain and tenderness has improved significantly.   Review Of Systems:  GEN- denies fatigue, fever, weight loss,weakness, recent illness HEENT- denies eye drainage, change in vision, nasal discharge, CVS- denies chest pain, palpitations RESP- denies SOB, cough, wheeze ABD- denies N/V, change in stools, abd pain GU- denies dysuria, hematuria, dribbling, incontinence MSK- denies joint pain, muscle aches, injury Neuro- denies headache, dizziness, syncope, seizure activity       Objective:    BP (!) 142/60   Pulse 66   Temp 98.7 F (37.1 C) (Temporal)   Resp 14   Ht 5\' 8"  (1.727 m)   Wt 269 lb (122 kg)   SpO2 97%   BMI 40.90 kg/m  GEN- NAD, alert and oriented x3 CVS- RRR, no murmur RESP-CTAB EXT- pedal edema L >R   Skin- maceration with mild erythema in web spaces bilat, cracked skin, small preulcerative callus at base of 4th digit on right foot, left foot 4th digit crack on sole with mild bleeding  Pulses- Radial, DP- diminished        Assessment & Plan:      Problem List Items Addressed This Visit      Unprioritized   Chronic diastolic heart failure ([place])  - Primary    Weight unchanged in the office But improved at home and swelling has decreased on exam Recheck renal function Continue aldactone and demadex 20mg        Relevant Orders   BASIC METABOLIC PANEL WITH GFR   Chronic kidney disease   Relevant Orders   BASIC METABOLIC PANEL WITH GFR   Peripheral edema   Tinea pedis    continue lamisil,left toe crack with bleeding, cleaned with NS at bedside and Triple antibiotic ointment applied, pt to do this at home         Note: This dictation was prepared with Dragon dictation along with smaller phrase technology. Any transcriptional errors that result from this process are unintentional.

## 2020-07-15 NOTE — Assessment & Plan Note (Signed)
Weight unchanged in the office But improved at home and swelling has decreased on exam Recheck renal function Continue aldactone and demadex 20mg 

## 2020-07-15 NOTE — Assessment & Plan Note (Signed)
continue lamisil,left toe crack with bleeding, cleaned with NS at bedside and Triple antibiotic ointment applied, pt to do this at home

## 2020-08-13 NOTE — Progress Notes (Signed)
Cardiology Office Note  Date: 08/14/2020   ID: [name], DOB [birth year], MRN [id number]  PCP:  Alycia Rossetti, MD  Cardiologist:  Rozann Lesches, MD Electrophysiologist:  None   Chief Complaint  Patient presents with  . Cardiac follow-up    History of Present Illness: [name] is a 79 y.o. male last seen in July by Ms. Strader PA-C.  He is here today with his wife for a follow-up visit.  From a cardiac perspective, he reports no interval palpitations and no significant angina.  He is limited by back and leg pain, uses a rolling walker.  Follow-up Myoview in July was overall low risk, no large ischemic territories.  We plan to continue medical therapy and observation.  I reviewed his current medications which are listed below.  He does not report any spontaneous bleeding problems on Eliquis.  I also reviewed his lab work from September.  Past Medical History:  Diagnosis Date  . Anemia   . C. difficile diarrhea   . CKD (chronic kidney disease), stage III ([place])   . Coronary artery disease    Status post CABG in 2007 [facility] [place])  . 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 GERD (gastroesophageal reflux disease)   . Glaucoma   . Hyperlipidemia   . Hypertension   . Morbid obesity ([place])   . PAF (paroxysmal atrial fibrillation) ([place])   . Peripheral vascular disease ([place])   . Sleep apnea   . Stroke [facility]) 2008, 2014, 2015    Past Surgical History:  Procedure Laterality Date  . CHOLECYSTECTOMY    . CORONARY ARTERY BYPASS GRAFT    . NO PAST SURGERIES    . THYROID SURGERY      Current Outpatient Medications  Medication Sig Dispense Refill  . acetaminophen (TYLENOL) 650 MG CR tablet Take 1,300 mg by mouth every 8 (eight) hours as needed for pain.    [name] amLODipine (NORVASC) 10 MG tablet Take 10 mg by mouth daily.    [name] apixaban (ELIQUIS) 5 MG TABS tablet Take 1 tablet (5 mg total) by mouth 2 (two) times daily.  180 tablet 3  . atorvastatin (LIPITOR) 80 MG tablet Take 80 mg by mouth every evening.     . clotrimazole-betamethasone (LOTRISONE) cream Apply 1 application topically 2 (two) times daily. (Patient taking differently: Apply 1 application topically as needed. ) 30 g 1  . Cyanocobalamin (VITAMIN B 12) 500 MCG TABS Take 1,000 mcg by mouth daily. 180 tablet 1  . ferrous sulfate 325 (65 FE) MG tablet Take 1 tablet (325 mg total) by mouth daily with breakfast. 90 tablet 3  . insulin aspart protamine- aspart (NOVOLOG MIX 70/30) (70-30) 100 UNIT/ML injection 42 units in the morning and 50 units at night.    . losartan (COZAAR) 100 MG tablet Take 100 mg by mouth daily.      . metFORMIN (GLUCOPHAGE) 1000 MG tablet Take 1,000 mg by mouth 2 (two) times daily with a meal.      . metoprolol tartrate 37.5 MG TABS Take 37.5 mg by mouth 2 (two) times daily. 60 tablet 2  . potassium chloride (KLOR-CON) 10 MEQ tablet Take 20 mEq by mouth daily.    [name] spironolactone (ALDACTONE) 25 MG tablet Take 25 mg by mouth daily.    . tamsulosin (FLOMAX) 0.4 MG CAPS capsule Take 2 capsules (0.8 mg total) by mouth every evening. 60 capsule 3  .  terbinafine (LAMISIL AT) 1 % cream Apply 1 application topically 2 (two) times daily. 30 g 0  . torsemide (DEMADEX) 20 MG tablet Take 1 tablet (20 mg total) by mouth daily. May take extra 20mg  tablet daily if weight gain of 3lbs or more noted in 24 hour period. 90 tablet 3   No current facility-administered medications for this visit.   Allergies:  Ivp dye [iodinated diagnostic agents]   ROS: No palpitations or syncope.  Physical Exam: VS:  BP 140/60   Pulse 70   Ht 5\' 8"  (1.727 m)   Wt 264 lb (119.7 kg)   SpO2 97%   BMI 40.14 kg/m , BMI Body mass index is 40.14 kg/m.  Wt Readings from Last 3 Encounters:  08/14/20 264 lb (119.7 kg)  07/15/20 269 lb (122 kg)  07/08/20 269 lb (122 kg)    General: Patient appears comfortable at rest. HEENT: Conjunctiva and lids normal, wearing  a mask. Neck: Supple, no elevated JVP or carotid bruits, no thyromegaly. Lungs: Clear to auscultation, nonlabored breathing at rest. Cardiac: Regular rate and rhythm, no S3 or significant systolic murmur. Abdomen: Soft, bowel sounds present. Extremities: Mild ankle edema, distal pulses 2+.  ECG:  An ECG dated 05/01/2020 was personally reviewed today and demonstrated:  Atypical atrial flutter with 2:1 block.  Recent Labwork: 10/28/2019: B Natriuretic Peptide 66.0 05/01/2020: TSH 2.437 05/03/2020: Magnesium 2.0 07/08/2020: ALT 17; AST 16; Hemoglobin 11.5; Platelets 187 07/15/2020: BUN 36; Creat 1.64; Potassium 4.5; Sodium 138     Component Value Date/Time   CHOL 154 03/07/2020 1158   TRIG 118 03/07/2020 1158   HDL 35 (L) 03/07/2020 1158   CHOLHDL 4.4 03/07/2020 1158   VLDL 18 10/11/2016 1042   [place] 98 03/07/2020 1158    Other Studies Reviewed Today:  Echocardiogram 06/07/2018: Study Conclusions  - Limited study to evaluate LV function. - Left ventricle: The cavity size was normal. Wall thickness was increased in a pattern of severe LVH. Systolic function was normal. The estimated ejection fraction was in the range of 55% to 60%. - Aortic valve: Mildly calcified annulus. Mildly thickened leaflets. - Mitral valve: Mildly calcified annulus. Mildly thickened leaflets. - Technically difficult study. Echocontrast was used to enhance visualization.  Lexiscan Myoview 05/15/2020:  Lexiscan is electrically negative for ischemi  Myovue scan with probable normal perfusion, minimal soft tissue attenuation. No changes to suggest ischemia.  LVEF calculated at 49% with septal hypokinesis; consider echo to further define LVEF / wall motion  This is a low risk study.  Assessment and Plan:  1.  Multivessel CAD status post CABG in 2007.  Follow-up Myoview in July was overall low risk showing no significant ischemic defects.  His last echocardiogram indicated LVEF 55 to 60%.  Continue  observation on Norvasc, Cozaar, Lipitor, and Aldactone.  He is not on aspirin given use of Eliquis.  2.  Paroxysmal atrial fibrillation/flutter with spontaneous conversion to sinus rhythm as described in the previous office note.  He is doing well without recurrent palpitations.  Heart rate regular today.  Continue Eliquis and Lopressor.  3.  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  Medication Adjustments/Labs and Tests Ordered: Current medicines are reviewed at length with the patient today.  Concerns regarding medicines are outlined above.   Tests Ordered: No orders of the defined types were placed in this encounter.   Medication Changes: No orders of the defined types were placed in this encounter.   Disposition:  Follow up 6 months in the [facility]  office.  Signed, Satira Sark, MD, [facility] 08/14/2020 2:09 PM    [place] [facility] [address] [place], [place] [address] Phone: [phone]; Fax: [phone]

## 2020-08-14 ENCOUNTER — Other Ambulatory Visit: Payer: Self-pay

## 2020-08-14 ENCOUNTER — Ambulatory Visit (INDEPENDENT_AMBULATORY_CARE_PROVIDER_SITE_OTHER): Payer: Medicare Other | Admitting: Cardiology

## 2020-08-14 ENCOUNTER — Encounter: Payer: Self-pay | Admitting: Cardiology

## 2020-08-14 VITALS — BP 140/60 | HR 70 | Ht 68.0 in | Wt 264.0 lb

## 2020-08-14 DIAGNOSIS — I480 Paroxysmal atrial fibrillation: Secondary | ICD-10-CM | POA: Diagnosis not present

## 2020-08-14 DIAGNOSIS — N1832 Chronic kidney disease, stage 3b: Secondary | ICD-10-CM | POA: Diagnosis not present

## 2020-08-14 DIAGNOSIS — I25119 Atherosclerotic heart disease of native coronary artery with unspecified angina pectoris: Secondary | ICD-10-CM

## 2020-08-14 NOTE — Patient Instructions (Signed)
Medication Instructions:  Your physician recommends that you continue on your current medications as directed. Please refer to the Current Medication list given to you today.  *If you need a refill on your cardiac medications before your next appointment, please call your pharmacy*   Lab Work: NONE   If you have labs (blood work) drawn today and your tests are completely normal, you will receive your results only by: . MyChart Message (if you have MyChart) OR . A paper copy in the mail If you have any lab test that is abnormal or we need to change your treatment, we will call you to review the results.   Testing/Procedures: NONE    Follow-Up: At [facility], you and your health needs are our priority.  As part of our continuing mission to provide you with exceptional heart care, we have created designated Provider Care Teams.  These Care Teams include your primary Cardiologist (physician) and Advanced Practice Providers (APPs -  Physician Assistants and Nurse Practitioners) who all work together to provide you with the care you need, when you need it.  We recommend signing up for the patient portal called "MyChart".  Sign up information is provided on this After Visit Summary.  MyChart is used to connect with patients for Virtual Visits (Telemedicine).  Patients are able to view lab/test results, encounter notes, upcoming appointments, etc.  Non-urgent messages can be sent to your provider as well.   To learn more about what you can do with MyChart, go to [email or web address].    Your next appointment:   6 month(s)  The format for your next appointment:   In Person  Provider:   Samuel McDowell, MD   Other Instructions Thank you for choosing [facility]!    

## 2020-08-15 ENCOUNTER — Emergency Department (HOSPITAL_COMMUNITY): Payer: No Typology Code available for payment source

## 2020-08-15 ENCOUNTER — Other Ambulatory Visit: Payer: Self-pay

## 2020-08-15 ENCOUNTER — Observation Stay (HOSPITAL_COMMUNITY)
Admission: EM | Admit: 2020-08-15 | Discharge: 2020-08-16 | Disposition: A | Discharge status: Home / Self Care | Payer: No Typology Code available for payment source | Attending: Internal Medicine | Admitting: Internal Medicine

## 2020-08-15 DIAGNOSIS — Z87891 Personal history of nicotine dependence: Secondary | ICD-10-CM | POA: Insufficient documentation

## 2020-08-15 DIAGNOSIS — Z79899 Other long term (current) drug therapy: Secondary | ICD-10-CM | POA: Insufficient documentation

## 2020-08-15 DIAGNOSIS — I4891 Unspecified atrial fibrillation: Secondary | ICD-10-CM

## 2020-08-15 DIAGNOSIS — I639 Cerebral infarction, unspecified: Secondary | ICD-10-CM

## 2020-08-15 DIAGNOSIS — I2510 Atherosclerotic heart disease of native coronary artery without angina pectoris: Secondary | ICD-10-CM | POA: Diagnosis present

## 2020-08-15 DIAGNOSIS — I6340 Cerebral infarction due to embolism of unspecified cerebral artery: Principal | ICD-10-CM | POA: Insufficient documentation

## 2020-08-15 DIAGNOSIS — R42 Dizziness and giddiness: Secondary | ICD-10-CM | POA: Diagnosis not present

## 2020-08-15 DIAGNOSIS — E1129 Type 2 diabetes mellitus with other diabetic kidney complication: Secondary | ICD-10-CM | POA: Diagnosis present

## 2020-08-15 DIAGNOSIS — E785 Hyperlipidemia, unspecified: Secondary | ICD-10-CM | POA: Diagnosis present

## 2020-08-15 DIAGNOSIS — N183 Chronic kidney disease, stage 3 unspecified: Secondary | ICD-10-CM | POA: Diagnosis not present

## 2020-08-15 DIAGNOSIS — Z794 Long term (current) use of insulin: Secondary | ICD-10-CM | POA: Insufficient documentation

## 2020-08-15 DIAGNOSIS — Z20822 Contact with and (suspected) exposure to covid-19: Secondary | ICD-10-CM | POA: Diagnosis not present

## 2020-08-15 DIAGNOSIS — E1122 Type 2 diabetes mellitus with diabetic chronic kidney disease: Secondary | ICD-10-CM | POA: Diagnosis not present

## 2020-08-15 DIAGNOSIS — I10 Essential (primary) hypertension: Secondary | ICD-10-CM | POA: Diagnosis present

## 2020-08-15 DIAGNOSIS — I5032 Chronic diastolic (congestive) heart failure: Secondary | ICD-10-CM | POA: Diagnosis present

## 2020-08-15 DIAGNOSIS — R2689 Other abnormalities of gait and mobility: Secondary | ICD-10-CM | POA: Diagnosis present

## 2020-08-15 DIAGNOSIS — I130 Hypertensive heart and chronic kidney disease with heart failure and stage 1 through stage 4 chronic kidney disease, or unspecified chronic kidney disease: Secondary | ICD-10-CM | POA: Insufficient documentation

## 2020-08-15 DIAGNOSIS — G4733 Obstructive sleep apnea (adult) (pediatric): Secondary | ICD-10-CM | POA: Diagnosis present

## 2020-08-15 DIAGNOSIS — E66813 Obesity, class 3: Secondary | ICD-10-CM

## 2020-08-15 DIAGNOSIS — I63533 Cerebral infarction due to unspecified occlusion or stenosis of bilateral posterior cerebral arteries: Secondary | ICD-10-CM | POA: Diagnosis not present

## 2020-08-15 DIAGNOSIS — Z7901 Long term (current) use of anticoagulants: Secondary | ICD-10-CM | POA: Insufficient documentation

## 2020-08-15 DIAGNOSIS — E782 Mixed hyperlipidemia: Secondary | ICD-10-CM | POA: Diagnosis present

## 2020-08-15 LAB — URINALYSIS, ROUTINE W REFLEX MICROSCOPIC
Bilirubin Urine: NEGATIVE
Glucose, UA: NEGATIVE mg/dL
Hgb urine dipstick: NEGATIVE
Ketones, ur: NEGATIVE mg/dL
Leukocytes,Ua: NEGATIVE
Nitrite: NEGATIVE
Protein, ur: NEGATIVE mg/dL
Specific Gravity, Urine: 1.006 (ref 1.005–1.030)
pH: 5 (ref 5.0–8.0)

## 2020-08-15 LAB — COMPREHENSIVE METABOLIC PANEL
ALT: 23 U/L (ref 0–44)
AST: 21 U/L (ref 15–41)
Albumin: 3.6 g/dL (ref 3.5–5.0)
Alkaline Phosphatase: 44 U/L (ref 38–126)
Anion gap: 10 (ref 5–15)
BUN: 30 mg/dL — ABNORMAL HIGH (ref 8–23)
CO2: 22 mmol/L (ref 22–32)
Calcium: 9.2 mg/dL (ref 8.9–10.3)
Chloride: 105 mmol/L (ref 98–111)
Creatinine, Ser: 1.72 mg/dL — ABNORMAL HIGH (ref 0.61–1.24)
GFR, Estimated: 38 mL/min — ABNORMAL LOW (ref >60)
Glucose, Bld: 155 mg/dL — ABNORMAL HIGH (ref 70–99)
Potassium: 4.2 mmol/L (ref 3.5–5.1)
Sodium: 137 mmol/L (ref 135–145)
Total Bilirubin: 0.6 mg/dL (ref 0.3–1.2)
Total Protein: 7.4 g/dL (ref 6.5–8.1)

## 2020-08-15 LAB — CBC WITH DIFFERENTIAL/PLATELET
Abs Immature Granulocytes: 0.02 10*3/uL (ref 0.00–0.07)
Basophils Absolute: 0 10*3/uL (ref 0.0–0.1)
Basophils Relative: 0 %
Eosinophils Absolute: 0.1 10*3/uL (ref 0.0–0.5)
Eosinophils Relative: 1 %
HCT: 36.2 % — ABNORMAL LOW (ref 39.0–52.0)
Hemoglobin: 11.3 g/dL — ABNORMAL LOW (ref 13.0–17.0)
Immature Granulocytes: 0 %
Lymphocytes Relative: 34 %
Lymphs Abs: 2.3 10*3/uL (ref 0.7–4.0)
MCH: 31.2 pg (ref 26.0–34.0)
MCHC: 31.2 g/dL (ref 30.0–36.0)
MCV: 100 fL (ref 80.0–100.0)
Monocytes Absolute: 0.7 10*3/uL (ref 0.1–1.0)
Monocytes Relative: 10 %
Neutro Abs: 3.6 10*3/uL (ref 1.7–7.7)
Neutrophils Relative %: 55 %
Platelets: 182 10*3/uL (ref 150–400)
RBC: 3.62 MIL/uL — ABNORMAL LOW (ref 4.22–5.81)
RDW: 12.8 % (ref 11.5–15.5)
WBC: 6.6 10*3/uL (ref 4.0–10.5)
nRBC: 0 % (ref 0.0–0.2)

## 2020-08-15 MED ORDER — ASPIRIN EC 81 MG PO TBEC
81.0000 mg | DELAYED_RELEASE_TABLET | Freq: Every day | ORAL | Status: DC
  Administered 2020-08-15 – 2020-08-16 (×2): 81 mg via ORAL
  Filled 2020-08-15 (×2): qty 1

## 2020-08-15 MED ORDER — INSULIN ASPART PROT & ASPART (70-30 MIX) 100 UNIT/ML ~~LOC~~ SUSP
30.0000 [IU] | Freq: Two times a day (BID) | SUBCUTANEOUS | Status: DC
  Administered 2020-08-16: 30 [IU] via SUBCUTANEOUS
  Filled 2020-08-15: qty 10

## 2020-08-15 MED ORDER — ACETAMINOPHEN 325 MG PO TABS: 650.0000 mg | ORAL_TABLET | ORAL | Status: DC | PRN

## 2020-08-15 MED ORDER — STROKE: EARLY STAGES OF RECOVERY BOOK
Freq: Once | Status: DC
  Filled 2020-08-15: qty 1

## 2020-08-15 MED ORDER — TAMSULOSIN HCL 0.4 MG PO CAPS
0.8000 mg | ORAL_CAPSULE | Freq: Every evening | ORAL | Status: DC
  Administered 2020-08-15: 0.8 mg via ORAL
  Filled 2020-08-15: qty 2

## 2020-08-15 MED ORDER — METOPROLOL TARTRATE 25 MG PO TABS
37.5000 mg | ORAL_TABLET | Freq: Two times a day (BID) | ORAL | Status: DC
  Administered 2020-08-15 – 2020-08-16 (×2): 37.5 mg via ORAL
  Filled 2020-08-15 (×2): qty 2

## 2020-08-15 MED ORDER — ATORVASTATIN CALCIUM 40 MG PO TABS
80.0000 mg | ORAL_TABLET | Freq: Every evening | ORAL | Status: DC
  Administered 2020-08-15: 80 mg via ORAL
  Filled 2020-08-15: qty 2

## 2020-08-15 MED ORDER — ACETAMINOPHEN 650 MG RE SUPP: 650.0000 mg | RECTAL | Status: DC | PRN

## 2020-08-15 MED ORDER — ACETAMINOPHEN 160 MG/5ML PO SOLN: 650.0000 mg | ORAL | Status: DC | PRN

## 2020-08-15 MED ORDER — APIXABAN 5 MG PO TABS
5.0000 mg | ORAL_TABLET | Freq: Two times a day (BID) | ORAL | Status: DC
  Administered 2020-08-16: 5 mg via ORAL
  Filled 2020-08-15: qty 1

## 2020-08-15 NOTE — ED Notes (Signed)
PT passed bedside swallow test and tolerated well. Pt is requesting to eat and diet order be given.

## 2020-08-15 NOTE — H&P (Signed)
TRH H&P   Patient Demographics:    [name], is a 79 y.o. male  MRN: [id number]   DOB - 06-Jul-1941  Admit Date - 08/15/2020  Outpatient Primary MD for the patient is [facility]  Referring MD/NP/PA: Dr Verta Ellen    Patient coming from: HOME  Chief Complaint  Patient presents with  . Dizziness      HPI:    [name]  is a 79 y.o. male, has medical history of A. fib on Eliquis, stroke, diabetes mellitus, GERD, hyperlipidemia, hypertension, obstructive sleep apnea on CPAP, morbid obesity, peripheral vascular disease, secondary to dizziness and unsteady gait, but report he woke up morning around 8 AM with the symptoms, last normal was 10:30 PM last night before he went to bed, patient report he had significant stumbling, feeling very unsteady on his feet more than normal, he denies any focal deficits, focal tingling or numbness, slurred speech, mental status change, no headache, port some worsening of his baseline blurry vision, no chest pain, palpitation or dyspnea, no fever, no chills, patient report he is on Eliquis for A. fib, but he has been out of it for few days currently. -In ED patient MRI was significant for bilateral acute/subacute CVA, for baseline CKD with a creatinine of 1.72, triage hospitalist consulted for further evaluation.    Review of systems:    In addition to the HPI above,  No Fever-chills, No Headache, No changes with Vision or hearing, No problems swallowing food or Liquids, No Chest pain, Cough or Shortness of Breath, No Abdominal pain, No Nausea or Vommitting, Bowel movements are regular, No Blood in stool or Urine, No dysuria, No new skin rashes or bruises, No new joints pains-aches,  No new weakness, tingling, numbness in any extremity, No recent weight gain or loss, No polyuria, polydypsia or polyphagia, No significant Mental  Stressors.  A full 10 point Review of Systems was done, except as stated above, all other Review of Systems were negative.   With Past History of the following :    Past Medical History:  Diagnosis Date  . Anemia   . C. difficile diarrhea   . CKD (chronic kidney disease), stage III ([place])   . Coronary artery disease    Status post CABG in 2007 [facility] [place])  . 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 GERD (gastroesophageal reflux disease)   . Glaucoma   . Hyperlipidemia   . Hypertension   . Morbid obesity ([place])   . PAF (paroxysmal atrial fibrillation) ([place])   . Peripheral vascular disease ([place])   . Sleep apnea   . Stroke [facility]) 2008, 2014, 2015      Past Surgical History:  Procedure Laterality Date  . CHOLECYSTECTOMY    . CORONARY ARTERY BYPASS GRAFT    . NO PAST SURGERIES    . THYROID  SURGERY        Social History:     Social History   Tobacco Use  . Smoking status: Former Smoker    Types: Cigarettes  . Smokeless tobacco: Never Used  Substance Use Topics  . Alcohol use: No      Family History :     Family History  Problem Relation Age of Onset  . Diabetes Mother   . Heart failure Mother   . Hypertension Mother   . Diabetes Father   . Heart failure Father   . Hypertension Father   . Diabetes Sister   . Heart failure Sister   . Hypertension Sister   . Hyperlipidemia Sister   . Diabetes Brother   . Heart failure Brother   . Hypertension Brother      Home Medications:   Prior to Admission medications   Medication Sig Start Date End Date Taking? Authorizing Provider  acetaminophen (TYLENOL) 650 MG CR tablet Take 1,300 mg by mouth every 8 (eight) hours as needed for pain.   Yes [provider]  amLODipine (NORVASC) 10 MG tablet Take 10 mg by mouth daily.   Yes [provider]  apixaban (ELIQUIS) 5 MG TABS tablet Take 1 tablet (5 mg total) by mouth 2 (two) times daily. 05/09/20 05/04/21  Yes Strader, Fransisco Hertz, PA-C  atorvastatin (LIPITOR) 80 MG tablet Take 80 mg by mouth every evening.    Yes [provider]  Cyanocobalamin (VITAMIN B 12) 500 MCG TABS Take 1,000 mcg by mouth daily. 03/07/20  Yes [place], Modena Nunnery, MD  ferrous sulfate 325 (65 FE) MG tablet Take 1 tablet (325 mg total) by mouth daily with breakfast. 05/09/20  Yes Strader, [place] M, PA-C  insulin aspart protamine- aspart (NOVOLOG MIX 70/30) (70-30) 100 UNIT/ML injection 42 units in the morning and 50 units at night. 07/10/20  Yes [place], Modena Nunnery, MD  losartan (COZAAR) 100 MG tablet Take 100 mg by mouth daily.     Yes [provider]  metFORMIN (GLUCOPHAGE) 1000 MG tablet Take 1,000 mg by mouth 2 (two) times daily with a meal.     Yes [provider]  metoprolol tartrate 37.5 MG TABS Take 37.5 mg by mouth 2 (two) times daily. 05/03/20  Yes Shah, Pratik D, DO  potassium chloride (KLOR-CON) 10 MEQ tablet Take 20 mEq by mouth daily. 05/27/20  Yes [provider]  spironolactone (ALDACTONE) 25 MG tablet Take 25 mg by mouth daily.   Yes [provider]  tamsulosin (FLOMAX) 0.4 MG CAPS capsule Take 2 capsules (0.8 mg total) by mouth every evening. 06/09/16  Yes [place], Modena Nunnery, MD  torsemide (DEMADEX) 20 MG tablet Take 1 tablet (20 mg total) by mouth daily. May take extra 20mg  tablet daily if weight gain of 3lbs or more noted in 24 hour period. 05/09/20 05/04/21 Yes Strader, Fransisco Hertz, PA-C  clotrimazole-betamethasone (LOTRISONE) cream Apply 1 application topically 2 (two) times daily. Patient not taking: Reported on 08/15/2020 12/02/17   Alycia Rossetti, MD  terbinafine (LAMISIL AT) 1 % cream Apply 1 application topically 2 (two) times daily. Patient not taking: Reported on 08/15/2020 07/08/20   Alycia Rossetti, MD     Allergies:     Allergies  Allergen Reactions  . Ivp Dye [Iodinated Diagnostic Agents] Rash     Physical Exam:   Vitals  Blood pressure (!) 123/46, pulse 61,  temperature 99 F (37.2 C), temperature source Oral, resp. rate 16, height 5\' 8"  (1.727  m), weight 117.2 kg, SpO2 98 %.   1. General well developed male, laying in bed, in no apparent distress  2. Normal affect and insight, Not Suicidal or Homicidal, Awake Alert, Oriented X 3.  3. No F.N deficits, ALL C.Nerves Intact, patient with some left-sided abnormality in coordination movement   4. Ears and Eyes appear Normal, Conjunctivae clear, PERRLA. Moist Oral Mucosa.  5. Supple Neck, No JVD, No cervical lymphadenopathy appriciated, No Carotid Bruits.  6. Symmetrical Chest wall movement, Good air movement bilaterally, CTAB.  7.  Irregular irregular, No Gallops, Rubs or Murmurs, No Parasternal Heave.  8. Positive Bowel Sounds, Abdomen Soft, No tenderness, No organomegaly appriciated,No rebound -guarding or rigidity.  9.  No Cyanosis, Normal Skin Turgor, No Skin Rash or Bruise.  10. Good muscle tone,  joints appear normal , no effusions, Normal ROM.  11.  No lymph node enlargement in cervical or axillary area  Data Review:    CBC Recent Labs  Lab 08/15/20 1413  WBC 6.6  HGB 11.3*  HCT 36.2*  PLT 182  MCV 100.0  MCH 31.2  MCHC 31.2  RDW 12.8  LYMPHSABS 2.3  MONOABS 0.7  EOSABS 0.1  BASOSABS 0.0   ------------------------------------------------------------------------------------------------------------------  Chemistries  Recent Labs  Lab 08/15/20 1413  NA 137  K 4.2  CL 105  CO2 22  GLUCOSE 155*  BUN 30*  CREATININE 1.72*  CALCIUM 9.2  AST 21  ALT 23  ALKPHOS 44  BILITOT 0.6   ------------------------------------------------------------------------------------------------------------------ estimated creatinine clearance is 44.7 mL/min (A) (by C-G formula based on SCr of 1.72 mg/dL (H)). ------------------------------------------------------------------------------------------------------------------ No results for input(s): TSH, T4TOTAL, T3FREE, THYROIDAB in  the last 72 hours.  Invalid input(s): FREET3  Coagulation profile No results for input(s): INR, PROTIME in the last 168 hours. ------------------------------------------------------------------------------------------------------------------- No results for input(s): DDIMER in the last 72 hours. -------------------------------------------------------------------------------------------------------------------  Cardiac Enzymes No results for input(s): CKMB, TROPONINI, MYOGLOBIN in the last 168 hours.  Invalid input(s): CK ------------------------------------------------------------------------------------------------------------------    Component Value Date/Time   BNP 66.0 10/28/2019 1229   BNP 174 (H) 06/08/2019 1131     ---------------------------------------------------------------------------------------------------------------  Urinalysis    Component Value Date/Time   COLORURINE COLORLESS (A) 08/15/2020 1430   APPEARANCEUR CLEAR 08/15/2020 1430   LABSPEC 1.006 08/15/2020 1430   PHURINE 5.0 08/15/2020 1430   GLUCOSEU NEGATIVE 08/15/2020 1430   HGBUR NEGATIVE 08/15/2020 1430   BILIRUBINUR NEGATIVE 08/15/2020 1430   KETONESUR NEGATIVE 08/15/2020 1430   PROTEINUR NEGATIVE 08/15/2020 1430   UROBILINOGEN 0.2 03/28/2014 0145   NITRITE NEGATIVE 08/15/2020 1430   LEUKOCYTESUR NEGATIVE 08/15/2020 1430    ----------------------------------------------------------------------------------------------------------------   Imaging Results:    CT Head Wo Contrast  Result Date: 08/15/2020 CLINICAL DATA:  Dizziness EXAM: CT HEAD WITHOUT CONTRAST TECHNIQUE: Contiguous axial images were obtained from the base of the skull through the vertex without intravenous contrast. COMPARISON:  11/20/2013 FINDINGS: Brain: No evidence of acute infarction, hemorrhage, hydrocephalus, extra-axial collection or mass lesion/mass effect. Remote lacunar infarcts in the bilateral basal ganglia. Moderate  low-density changes within the periventricular and subcortical white matter compatible with chronic microvascular ischemic change. Mild diffuse cerebral volume loss. Vascular: Atherosclerotic calcifications involving the large vessels of the skull base. No unexpected hyperdense vessel. Skull: Normal. Negative for fracture or focal lesion. Sinuses/Orbits: No acute finding. Other: None. IMPRESSION: 1. No acute intracranial findings. 2. Chronic microvascular ischemic change and cerebral volume loss. Electronically Signed   By: Davina Poke D.O.   On: 08/15/2020 14:47   MR  ANGIO HEAD WO CONTRAST  Result Date: 08/15/2020 CLINICAL DATA:  Stroke follow-up EXAM: MRA HEAD WITHOUT CONTRAST TECHNIQUE: Angiographic images of the Circle of Willis were obtained using MRA technique without intravenous contrast. COMPARISON:  MRI head 08/15/2020 FINDINGS: Both vertebral arteries patent to the basilar. Atherosclerotic irregularity and mild stenosis mid basilar. Superior cerebellar and posterior cerebral arteries patent. Mild stenosis in the posterior cerebral artery bilaterally, left P2 segment and right P3 segment. Internal carotid artery widely patent bilaterally. Patent posterior communicating artery bilaterally, right greater than left. Anterior and middle cerebral arteries patent bilaterally. No significant stenosis or large vessel occlusion Negative for cerebral aneurysm. IMPRESSION: Mild intracranial atherosclerotic disease. No large vessel occlusion. Electronically Signed   By: Franchot Gallo M.D.   On: 08/15/2020 16:01   MR BRAIN WO CONTRAST  Result Date: 08/15/2020 CLINICAL DATA:  Stroke, follow-up EXAM: MRI HEAD WITHOUT CONTRAST TECHNIQUE: Multiplanar, multiecho pulse sequences of the brain and surrounding structures were obtained without intravenous contrast. COMPARISON:  2015 FINDINGS: Brain: Two small foci of mildly reduced diffusion in the corona radiata bilaterally. No evidence of hemorrhage. Prominence  of the ventricles and sulci reflects generalized parenchymal volume loss. Patchy and confluent areas of T2 hyperintensity in the supratentorial and pontine white matter are nonspecific but probably reflect moderate to marked chronic microvascular ischemic changes. These findings have progressed since 2015. Multiple chronic infarcts are again identified including involvement of the left brachium pontis, thalami, and central white matter. There is no hydrocephalus or extra-axial fluid collection. Vascular: Major vessel flow voids at the skull base are preserved. Skull and upper cervical spine: Normal marrow signal is preserved. Sinuses/Orbits: Minor mucosal thickening. Bilateral lens replacements. Other: Sella is unremarkable.  Mastoid air cells are clear. IMPRESSION: Two small acute to subacute infarcts involving the corona radiata bilaterally. Progression of chronic microvascular ischemic changes and parenchymal volume loss since 2015. Chronic small vessel infarcts. Electronically Signed   By: Macy Mis M.D.   On: 08/15/2020 16:05    My personal review of NTZ:[id number]   Assessment & Plan:    Active Problems:   HTN (hypertension)   DM (diabetes mellitus) type II controlled with renal manifestation (HCC)   CAD (coronary artery disease)   Hyperlipidemia   OSA (obstructive sleep apnea)   Chronic diastolic heart failure (HCC)   CKD stage 3 due to type 2 diabetes mellitus (HCC)   Atrial fibrillation with rapid ventricular response (HCC)   Acute CVA (cerebrovascular accident) ([place])   Acute CVA -Patient presents with unsteady gait, his MRI of the brain showing acute to bilateral CVAs, I have discussed in details with neurology at [place], this is clearly an embolic CVA in the setting of him missing his Eliquis for few days, so current recommendation is to be admission overnight where he will be evaluated by PT/OT/SLP, continue routine stroke work-up including ultrasound carotid, 2D  echo and A1c and lipid panel, allow for permissive hypertension overnight, and he is to be resumed on his Eliquis 5 mg oral twice daily for A. fib from tomorrow morning, and to be given baby aspirin overnight, patient is okay to stay at [facility], for any further questions we can contact [facility] for further input.  Atrial fibrillation -Resuming Eliquis, first dose tomorrow morning as discussed with neurology, continue with metoprolol for heart rate control.  Hypertension -Allow for permissive hypertension and hold losartan and amlodipine, but continue with metoprolol for heart rate control.  Diabetes mellitus type 2 -He is  on insulin 70/30, I will resume at a lower dose 30 units twice daily, will add sliding scale, will hold Metformin and check A1c.  Kd stage III -Function at baseline, continue to monitor  Hyperlipidemia -Check lipid panel, continue with statin.  OSA -Continue with CPAP  Chronic diastolic CHF -Euvolemic, resume Aldactone and torsemide on discharge.  BPH -Continue with Flomax  History of CAD -Denies any chest pain or shortness of breath, will obtain EKG, resume Eliquis tomorrow, continue with statin and beta-blockers, resume losartan on discharge  DVT Prophylaxis >Eliquis  AM Labs Ordered, also please review Full Orders  Family Communication: Admission, patients condition and plan of care including tests being ordered have been discussed with the patient  who indicate understanding and agree with the plan and Code Status.  Code Status Full  Likely DC to  home  Condition GUARDED   Consults called: Discussed with neurology at [facility] Dr. Loreen Freud  Admission status: inpatient    Time spent in minutes : 60 minutes   Phillips Climes M.D on 08/15/2020 at 6:29 PM   [facility] - Office  [phone]

## 2020-08-15 NOTE — Progress Notes (Signed)
Janett Billow, RN informed this RT that patient is refusing CPAP.

## 2020-08-15 NOTE — ED Provider Notes (Signed)
Patient care transferred to me. MRI shows 2 bilateral acute strokes. Patient tells me he has been out of his Eliquis for the last couple days as he is waiting on refills to be sent from his pharmacy. Neurology consulted. Dr Merlene Laughter recommends typical stroke workup/PT, but he won't be here this weekend. Up to hospitalist as to whether to transfer to [facility]. Hospitalist will admit.    Sherwood Gambler, MD 08/15/20 1743

## 2020-08-15 NOTE — ED Provider Notes (Signed)
Emergency Department Provider Note   I have reviewed the triage vital signs and the nursing notes.   HISTORY  Chief Complaint Dizziness   HPI [name] is a 79 y.o. male with past medical history reviewed below presents to the emergency department with gait instability worsening suddenly upon waking this morning.  Patient last normal at 10:30 PM last night.  He states that at baseline he walks with a walker and cane and use a lift chair to get out of bed.  Today when he woke at 8 AM he got up and had significant stumbling.  He feels very unsteady on his feet even more than normal.  He is not experiencing unilateral weakness or numbness.  No headache.  He is having some blurry vision which is chronic but seems slightly worse today as well.  No chest pain, palpitations, shortness of breath.  No radiation of symptoms or other modifying factors.   Past Medical History:  Diagnosis Date  . Anemia   . C. difficile diarrhea   . CKD (chronic kidney disease), stage III ([place])   . Coronary artery disease    Status post CABG in 2007 [facility] [place])  . 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 GERD (gastroesophageal reflux disease)   . Glaucoma   . Hyperlipidemia   . Hypertension   . Morbid obesity ([name])   . PAF (paroxysmal atrial fibrillation) ([name])   . Peripheral vascular disease ([name])   . Sleep apnea   . Stroke [facility]) 2008, 2014, 2015    Patient Active Problem List   Diagnosis Date Noted  . Acute embolic stroke ([name]) 60/73/7106  . Acute CVA (cerebrovascular accident) ([name]) 08/15/2020  . Atrial fibrillation with rapid ventricular response ([name]) 05/02/2020  . Anemia of chronic disease 03/07/2020  . CKD stage 3 due to type 2 diabetes mellitus ([name]) 06/12/2019  . Chest pain 06/07/2018  . Hypoxia   . Lactic acidosis 02/24/2018  . Dyspnea 02/20/2018  . BPH (benign prostatic hyperplasia) 02/20/2018  . DDD (degenerative disc disease),  lumbar 10/17/2017  . Chronic diastolic heart failure ([name]) 10/20/2015  . Peripheral edema 01/22/2015  . Tinea pedis 10/21/2014  . 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 Chronic kidney disease 11/21/2013  . Left leg weakness 09/06/2013  . Left-sided weakness 08/22/2013  . OSA (obstructive sleep apnea) 04/12/2013  . DM (diabetes mellitus) type II controlled with renal manifestation ([name]) 11/28/2012  . PVD (peripheral vascular disease) ([name]) 11/28/2012  . CAD (coronary artery disease) 11/28/2012  . Hyperlipidemia 11/28/2012  . Class 2 severe obesity due to excess calories with serious comorbidity and body mass index (BMI) of 39.0 to 39.9 in adult ([place]) 11/28/2012  . History of stroke 11/28/2012  . OAB (overactive bladder) 11/28/2012  . HTN (hypertension) 05/20/2011    Past Surgical History:  Procedure Laterality Date  . CHOLECYSTECTOMY    . CORONARY ARTERY BYPASS GRAFT    . NO PAST SURGERIES    . THYROID SURGERY      Allergies Ivp dye [iodinated diagnostic agents]  Family History  Problem Relation Age of Onset  . Diabetes Mother   . Heart failure Mother   . Hypertension Mother   . Diabetes Father   . Heart failure Father   . Hypertension Father   . Diabetes Sister   . Heart failure Sister   . Hypertension Sister   . Hyperlipidemia Sister   . Diabetes  Brother   . Heart failure Brother   . Hypertension Brother     Social History Social History   Tobacco Use  . Smoking status: Former Smoker    Types: Cigarettes  . Smokeless tobacco: Never Used  Vaping Use  . Vaping Use: Never used  Substance Use Topics  . Alcohol use: No  . Drug use: No    Review of Systems  Constitutional: No fever/chills Eyes: Question increased blurry vision.  ENT: No sore throat. Cardiovascular: Denies chest pain. Respiratory: Denies shortness of breath. Gastrointestinal: No abdominal pain.  No nausea, no vomiting.  No diarrhea.  No  constipation. Genitourinary: Negative for dysuria. Musculoskeletal: Negative for back pain. Skin: Negative for rash. Neurological: Negative for headaches, focal weakness or numbness. Positive gait instability.   10-point ROS otherwise negative.  ____________________________________________   PHYSICAL EXAM:  VITAL SIGNS: ED Triage Vitals  Enc Vitals Group     BP 08/15/20 1338 (!) 123/46     Pulse Rate 08/15/20 1338 61     Resp 08/15/20 1338 16     Temp 08/15/20 1338 99 F (37.2 C)     Temp Source 08/15/20 1338 Oral     SpO2 08/15/20 1338 98 %     Weight 08/15/20 1341 258 lb 6.4 oz (117.2 kg)     Height 08/15/20 1341 5\' 8"  (1.727 m)   Constitutional: Alert and oriented. Well appearing and in no acute distress. Eyes: Conjunctivae are normal. PERRL. EOMI. Head: Atraumatic. Nose: No congestion/rhinnorhea. Mouth/Throat: Mucous membranes are moist.   Neck: No stridor.   Cardiovascular: Normal rate, regular rhythm. Good peripheral circulation. Grossly normal heart sounds.   Respiratory: Normal respiratory effort.  No retractions. Lungs CTAB. Gastrointestinal: Soft and nontender. No distention.  Musculoskeletal: No lower extremity tenderness nor edema. No gross deformities of extremities. Neurologic:  Normal speech and language.  Patient with 5 out of 5 strength in the bilateral upper and lower extremities.  No facial asymmetry.  No sensory deficits in the face, arms, legs.  Patient with normal finger-to-nose testing with only slight past-pointing occasionally which she has bilateral but corrects with reattempting the maneuver.  Heel-to-shin grossly normal but limited due to decreased flexibility in the bilateral lower extremities.  Skin:  Skin is warm, dry and intact. No rash noted.  ____________________________________________   LABS (all labs ordered are listed, but only abnormal results are displayed)  Labs Reviewed  COMPREHENSIVE METABOLIC PANEL - Abnormal; Notable for the  following components:      Result Value   Glucose, Bld 155 (*)    BUN 30 (*)    Creatinine, Ser 1.72 (*)    GFR, Estimated 38 (*)    All other components within normal limits  CBC WITH DIFFERENTIAL/PLATELET - Abnormal; Notable for the following components:   RBC 3.62 (*)    Hemoglobin 11.3 (*)    HCT 36.2 (*)    All other components within normal limits  URINALYSIS, ROUTINE W REFLEX MICROSCOPIC - Abnormal; Notable for the following components:   Color, Urine COLORLESS (*)    All other components within normal limits  HEMOGLOBIN A1C - Abnormal; Notable for the following components:   Hgb A1c MFr Bld 8.4 (*)    All other components within normal limits  LIPID PANEL - Abnormal; Notable for the following components:   HDL 30 (*)    All other components within normal limits  CBG MONITORING, ED - Abnormal; Notable for the following components:   Glucose-Capillary 214 (*)  All other components within normal limits  URINE CULTURE  RESPIRATORY PANEL BY RT PCR (FLU A&B, COVID)   ____________________________________________  RADIOLOGY  CT head reviewed  ____________________________________________   PROCEDURES  Procedure(s) performed:   Procedures  None ____________________________________________   INITIAL IMPRESSION / ASSESSMENT AND PLAN / ED COURSE  Pertinent labs & imaging results that were available during my care of the patient were reviewed by me and considered in my medical decision making (see chart for details).   Patient presents to the emergency department with acute onset staggering and gait instability.  No clear focal neurologic deficit on exam.  CT imaging of the head unremarkable and labs are overall reassuring.  UA is pending and I have sent the patient for MR brain to r/o cerebellar CVA.   Labs and CT imaging reviewed. No acute findings. MRI brain pending. Care transferred to Dr. Regenia Skeeter.  ____________________________________________  FINAL CLINICAL  IMPRESSION(S) / ED DIAGNOSES  Final diagnoses:  Acute embolic stroke ([place])  Stroke ([place])    MEDICATIONS GIVEN DURING THIS VISIT:  Medications  perflutren lipid microspheres (DEFINITY) IV suspension (1 mL Intravenous Given 08/16/20 1009)    Note:  This document was prepared using Dragon voice recognition software and may include unintentional dictation errors.  Nanda Quinton, MD, [facility] Emergency Medicine    Celines Femia, Wonda Olds, MD 08/18/20 [phone]

## 2020-08-15 NOTE — ED Triage Notes (Signed)
Pt states he feels dizzy and is "staggering" all over the place; pt states he went to bed last night at 10:30 and felt fine. Pt states he woke up this am at 8 and was stumbling around; pt denies any headache or one sided weakness

## 2020-08-16 ENCOUNTER — Inpatient Hospital Stay (HOSPITAL_COMMUNITY): Payer: No Typology Code available for payment source

## 2020-08-16 ENCOUNTER — Inpatient Hospital Stay (HOSPITAL_BASED_OUTPATIENT_CLINIC_OR_DEPARTMENT_OTHER): Payer: No Typology Code available for payment source

## 2020-08-16 DIAGNOSIS — Z8673 Personal history of transient ischemic attack (TIA), and cerebral infarction without residual deficits: Secondary | ICD-10-CM | POA: Diagnosis not present

## 2020-08-16 DIAGNOSIS — I6523 Occlusion and stenosis of bilateral carotid arteries: Secondary | ICD-10-CM | POA: Diagnosis not present

## 2020-08-16 DIAGNOSIS — I5032 Chronic diastolic (congestive) heart failure: Secondary | ICD-10-CM

## 2020-08-16 DIAGNOSIS — I639 Cerebral infarction, unspecified: Secondary | ICD-10-CM | POA: Diagnosis not present

## 2020-08-16 DIAGNOSIS — E1122 Type 2 diabetes mellitus with diabetic chronic kidney disease: Secondary | ICD-10-CM | POA: Diagnosis not present

## 2020-08-16 DIAGNOSIS — I6389 Other cerebral infarction: Secondary | ICD-10-CM | POA: Diagnosis not present

## 2020-08-16 DIAGNOSIS — N183 Chronic kidney disease, stage 3 unspecified: Secondary | ICD-10-CM

## 2020-08-16 DIAGNOSIS — Z6839 Body mass index (BMI) 39.0-39.9, adult: Secondary | ICD-10-CM

## 2020-08-16 DIAGNOSIS — I10 Essential (primary) hypertension: Secondary | ICD-10-CM

## 2020-08-16 DIAGNOSIS — I2510 Atherosclerotic heart disease of native coronary artery without angina pectoris: Secondary | ICD-10-CM | POA: Diagnosis not present

## 2020-08-16 DIAGNOSIS — I4891 Unspecified atrial fibrillation: Secondary | ICD-10-CM | POA: Diagnosis not present

## 2020-08-16 LAB — LIPID PANEL
Cholesterol: 128 mg/dL (ref 0–200)
HDL: 30 mg/dL — ABNORMAL LOW (ref >40)
LDL Cholesterol: 76 mg/dL (ref 0–99)
Total CHOL/HDL Ratio: 4.3 RATIO
Triglycerides: 110 mg/dL (ref <150)
VLDL: 22 mg/dL (ref 0–40)

## 2020-08-16 LAB — ECHOCARDIOGRAM COMPLETE
Area-P 1/2: 2.26 cm2
Height: 68 in
S' Lateral: 2.2 cm
Weight: 4134.4 oz

## 2020-08-16 LAB — RESPIRATORY PANEL BY RT PCR (FLU A&B, COVID)
Influenza A by PCR: NEGATIVE
Influenza B by PCR: NEGATIVE
SARS Coronavirus 2 by RT PCR: NEGATIVE

## 2020-08-16 LAB — CBG MONITORING, ED: Glucose-Capillary: 214 mg/dL — ABNORMAL HIGH (ref 70–99)

## 2020-08-16 MED ORDER — PERFLUTREN LIPID MICROSPHERE
1.0000 mL | INTRAVENOUS | Status: AC | PRN
  Administered 2020-08-16: 1 mL via INTRAVENOUS
  Filled 2020-08-16: qty 10

## 2020-08-16 NOTE — Plan of Care (Signed)
  Problem: Acute Rehab PT Goals(only PT should resolve) Goal: Patient Will Transfer Sit To/From Stand Flowsheets (Taken 08/16/2020 1439) Patient will transfer sit to/from stand: with min guard assist Goal: Pt Will Ambulate Flowsheets (Taken 08/16/2020 1439) Pt will Ambulate:  > 125 feet  with modified independence  with rolling walker Goal: Pt Will Go Up/Down Stairs Flowsheets (Taken 08/16/2020 1439) Pt will Go Up / Down Stairs:  3-5 stairs  with min guard assist  with rail(s)  2:39 PM, 08/16/20 Jerene Pitch, DPT Physical Therapy with [facility]  [phone] office

## 2020-08-16 NOTE — ED Notes (Signed)
Pt bed changed due to incontinence.

## 2020-08-16 NOTE — Evaluation (Signed)
Physical Therapy Evaluation Patient Details Name: [name] MRN: [id number] DOB: Apr 21, 1941 Today's Date: 08/16/2020   History of Present Illness  [name]  is a 79 y.o. male, has medical history of A. fib on Eliquis, stroke, diabetes mellitus, GERD, hyperlipidemia, hypertension, obstructive sleep apnea on CPAP, morbid obesity, peripheral vascular disease, secondary to dizziness and unsteady gait, but report he woke up morning around 8 AM with the symptoms, last normal was 10:30 PM last night before he went to bed, patient report he had significant stumbling, feeling very unsteady on his feet more than normal, he denies any focal deficits, focal tingling or numbness, slurred speech, mental status change, no headache, port some worsening of his baseline blurry vision, no chest pain, palpitation or dyspnea, no fever, no chills, patient report he is on Eliquis for A. fib, but he has been out of it for few days currently.   Clinical Impression    Patient sitting edge of bed at start of session and agreeable to therapy. Able to perform bed mobilities and transfers with modified independence. Wife reported it was a challenge getting him up from regular size chair, but they have a lift chair at home for him as this is normally challenging. Able to ambulate with min guard assist with RW. Patient ambulates intermittently with RW at home and instructed patient to use RW at all times for safety secondary to tendency to kick toes during walking as clearance is not great (no strength deficit noted with MMT but likely muscle length issue bilaterally). Discussed current functional status with wife and patient.  Patient will continue to benefit from skilled physical therapy in hospital and recommended venue below to increase strength, balance, endurance for safe ADLs and gait.   Follow Up Recommendations Home health PT;Outpatient PT;Supervision for mobility/OOB;Supervision - Intermittent    Equipment  Recommendations  None recommended by PT    Recommendations for Other Services       Precautions / Restrictions Precautions Precautions: None      Mobility  Bed Mobility Overal bed mobility: Modified Independent                Transfers Overall transfer level: Modified independent Equipment used: Rolling walker (2 wheeled)                Ambulation/Gait Ambulation/Gait assistance: Min guard;Supervision Gait Distance (Feet): 100 Feet Assistive device: Rolling walker (2 wheeled) Gait Pattern/deviations: Decreased stride length;Decreased dorsiflexion - right;Decreased dorsiflexion - left;Shuffle (occassionally hits toes on ground) Gait velocity: decreased      Stairs            Wheelchair Mobility    Modified Rankin (Stroke Patients Only)       Balance Overall balance assessment: Modified Independent                                           Pertinent Vitals/Pain Pain Assessment: No/denies pain    Home Living Family/patient expects to be discharged to:: Private residence Living Arrangements: Spouse/significant other Available Help at Discharge: Family Type of Home: House Home Access: Stairs to enter Entrance Stairs-Rails: Psychiatric nurse of Steps: 2 Home Layout: Two level;Able to live on main level with bedroom/bathroom Home Equipment: Gilford Rile - 2 wheels;Cane - single point;Shower seat      Prior Function Level of Independence: Independent with assistive device(s)  Comments: walks with RW as needed, has rollator for community ambulation      Hand Dominance        Extremity/Trunk Assessment        Lower Extremity Assessment Lower Extremity Assessment: Generalized weakness       Communication   Communication: No difficulties  Cognition Arousal/Alertness: Awake/alert Behavior During Therapy: WFL for tasks assessed/performed Overall Cognitive Status: Within Functional Limits for  tasks assessed                                        General Comments      Exercises     Assessment/Plan    PT Assessment Patient needs continued PT services  PT Problem List Decreased strength;Decreased activity tolerance;Decreased mobility;Decreased balance;Decreased range of motion       PT Treatment Interventions Gait training;DME instruction;Stair training;Therapeutic activities;Therapeutic exercise;Balance training;Neuromuscular re-education;Patient/family education    PT Goals (Current goals can be found in the Care Plan section)  Acute Rehab PT Goals Patient Stated Goal: to go home PT Goal Formulation: With patient/family Time For Goal Achievement: 08/30/20 Potential to Achieve Goals: Good    Frequency Min 3X/week   Barriers to discharge  (none)      Co-evaluation               AM-PAC PT "6 Clicks" Mobility  Outcome Measure Help needed turning from your back to your side while in a flat bed without using bedrails?: None Help needed moving from lying on your back to sitting on the side of a flat bed without using bedrails?: None Help needed moving to and from a bed to a chair (including a wheelchair)?: A Little Help needed standing up from a chair using your arms (e.g., wheelchair or bedside chair)?: A Lot Help needed to walk in hospital room?: A Little Help needed climbing 3-5 steps with a railing? : A Lot 6 Click Score: 18    End of Session Equipment Utilized During Treatment: Gait belt Activity Tolerance: Patient tolerated treatment well;Patient limited by fatigue Patient left: in bed;with call bell/phone within reach;with family/visitor present Nurse Communication: Mobility status PT Visit Diagnosis: Unsteadiness on feet (R26.81);Muscle weakness (generalized) (M62.81);Other abnormalities of gait and mobility (R26.89);Difficulty in walking, not elsewhere classified (R26.2)    Time: 5885-0277 PT Time Calculation (min) (ACUTE ONLY):  24 min   Charges:   PT Evaluation $PT Eval Low Complexity: 1 Low          2:34 PM, 08/16/20 Jerene Pitch, DPT Physical Therapy with [facility]  [phone] office

## 2020-08-16 NOTE — Care Management CC44 (Signed)
Condition Code 44 Documentation Completed  Patient Details  Name: AKAASH VANDEWATER MRN: [id number] Date of Birth: September 26, 1941   Condition Code 44 given:  Yes Patient signature on Condition Code 44 notice:  Yes Documentation of 2 MD's agreement:  Yes Code 44 added to claim:  Yes    Natasha Bence, LCSW 08/16/2020, 3:31 PM

## 2020-08-16 NOTE — TOC Transition Note (Signed)
Transition of Care [facility]) - CM/SW Discharge Note   Patient Details  Name: [name] MRN: [id number] Date of Birth: [birth year]  Transition of Care [facility] CM/SW Contact:  Natasha Bence, LCSW Phone Number: 08/16/2020, 4:28 PM   Clinical Narrative:    CSW received notification of patient's discharge. [name] requested OPPT referral for [facility] and stated that she knew that that was the previous name of it. Patient confirmed the location of the facility as the corner of [address] [place] [facility]. Facility in that location now referred to as [facility]. CSW will fax referral on Monday 08/19/2020 due facility being closed on the weekend. TOC signing off.   Final next level of care: OP Rehab Barriers to Discharge: Barriers Resolved   Patient Goals and CMS Choice Patient states their goals for this hospitalization and ongoing recovery are:: Rehab with OPPT   Choice offered to / list presented to : Spouse  Discharge Placement                  Name of family member notified: [name] Patient and family notified of of transfer: 08/16/20  Discharge Plan and Services                                     Social Determinants of Health (SDOH) Interventions     Readmission Risk Interventions No flowsheet data found.

## 2020-08-16 NOTE — Progress Notes (Signed)
*  PRELIMINARY RESULTS* Echocardiogram 2D Echocardiogram has been performed.  [name] 08/16/2020, 10:17 AM

## 2020-08-16 NOTE — Discharge Summary (Signed)
Physician Discharge Summary  [name] HEN:[id number] DOB: 1941/10/21 DOA: 08/15/2020  PCP: [facility]  Admit date: 08/15/2020 Discharge date: 08/16/2020  Time spent: 30 minutes  Recommendations for Outpatient Follow-up:  1. Close monitoring of patient's CBGs with further adjustment to hypoglycemic regimen as needed; A1c demonstrating patient diabetes not at goal. 2. Repeat CBC to follow hemoglobin trend as patient has now been once again resumed on anticoagulation therapy. 3. Repeat basic metabolic panel to follow electrolytes and renal function 4. Reassess patient blood pressure and adjust antihypertensive treatment as needed.   Discharge Diagnoses:  Active Problems:   HTN (hypertension)   DM (diabetes mellitus) type II controlled with renal manifestation (HCC)   CAD (coronary artery disease)   Hyperlipidemia   Class 2 severe obesity due to excess calories with serious comorbidity and body mass index (BMI) of 39.0 to 39.9 in adult (HCC)   OSA (obstructive sleep apnea)   Chronic diastolic heart failure ([place])   CKD stage 3 due to type 2 diabetes mellitus (HCC)   Atrial fibrillation with rapid ventricular response ([place])   Acute CVA (cerebrovascular accident) ([place])   Acute embolic stroke [facility])   Discharge Condition: Stable and improved.  Patient discharged home with home health services for physical therapy rehabilitation and instruction to follow-up with neurology service in 4 weeks.  CODE STATUS: Full code.  Diet recommendation: Modified carbohydrate diet, low calorie and low sodium diet  Filed Weights   08/15/20 1341  Weight: 117.2 kg    History of present illness:  As per H&P written by Dr. Waldron Labs on 08/15/2020 [name]  is a 79 y.o. male, has medical history of A. fib on Eliquis, stroke, diabetes mellitus, GERD, hyperlipidemia, hypertension, obstructive sleep apnea on CPAP, morbid obesity, peripheral vascular disease, secondary to dizziness and  unsteady gait, but report he woke up morning around 8 AM with the symptoms, last normal was 10:30 PM last night before he went to bed, patient report he had significant stumbling, feeling very unsteady on his feet more than normal, he denies any focal deficits, focal tingling or numbness, slurred speech, mental status change, no headache, port some worsening of his baseline blurry vision, no chest pain, palpitation or dyspnea, no fever, no chills, patient report he is on Eliquis for A. fib, but he has been out of it for few days currently. -In ED patient MRI was significant for bilateral acute/subacute CVA, for baseline CKD with a creatinine of 1.72, triage hospitalist consulted for further evaluation.  Hospital Course:  1-acute embolic CVA -Patient presented out of therapeutic window for any intervention -MRI demonstrating acute/subacute embolic stroke -Case discussed with neurology service recommended resumption of Eliquis and secondary risk modification -Patient expressed missing some of the anticoagulation dosages prior to coming to the hospital -Discussion about medication compliance and importance provided. Physical therapy has seen patient and recommended home health PT with outpatient rehabilitation.  Orders in place at time of discharge. -Outpatient follow-up in 4 weeks with neurology has been requested.  2-atrial fibrillation -Continue the use of metoprolol -Rate control currently -Continue Eliquis.  3-type 2 diabetes mellitus -Resume home heart failure therapy as recommended and a follow-up with cardiology service as an outpatient  4-history of BPH -Continue Flomax -No complaints of urinary retention currently  5-history of coronary artery disease -Patient denies chest pain or shortness of breath -Resume outpatient medical management and follow up with cardiology service.  6-class II obesity/obstructive sleep apnea -Body mass index is 39.29 kg/m. -Low calorie  diet, portion  control increase physical activity discussed with patient -Continue the use of CPAP every night.  7-chronic kidney disease a stage IIIa -Appears to be stable and at baseline -Continue to follow renal function closely to guarantee stability. -Patient advised to minimize the use of nephrotoxic agents, to be compliant with his medication regimen and to maintain adequate hydration.   Procedures: See below for x-ray reports  2D echo: Final results pending.  Carotid Dopplers: IMPRESSION: 1. Right carotid artery system: Less than 50% stenosis secondary to atherosclerotic plaque formation, not significantly progressed since 2014. 2. Left carotid artery system: Less than 50% stenosis secondary to atherosclerotic plaque formation, not significantly progressed since 2014. 3.  Vertebral artery system: Patent with antegrade flow bilaterally.  Consultations:  Neurology service  Discharge Exam: Vitals:   08/16/20 1400 08/16/20 1430  BP: 127/72 (!) 140/100  Pulse: (!) 112 (!) 58  Resp: 12 11  Temp:    SpO2: 91% 100%    General: Afebrile, no chest pain, no nausea, no vomiting.  Reports no palpitations.  Still feeling weak but is slightly better in comparison with his symptoms at presentation. Cardiovascular: Rate controlled, no rubs, no gallops, unable to properly assess JVD with body habitus. Respiratory: Good air movement bilaterally, no crackles, no wheezing, no using accessory muscle.  Good oxygen saturation on room air. Abdomen: Soft, nontender, nondistended, obese in examination; positive bowel sounds. Extremities: No cyanosis or clubbing.  Positive 1-2+ edema bilaterally  Discharge Instructions   Discharge Instructions    (HEART FAILURE PATIENTS) Call MD:  Anytime you have any of the following symptoms: 1) 3 pound weight gain in 24 hours or 5 pounds in 1 week 2) shortness of breath, with or without a dry hacking cough 3) swelling in the hands, feet or stomach 4) if you have to  sleep on extra pillows at night in order to breathe.   Complete by: As directed    Diet - low sodium heart healthy   Complete by: As directed    Diet Carb Modified   Complete by: As directed    Discharge instructions   Complete by: As directed    Take medications as prescribed Maintain adequate hydration Follow modified carbohydrate and low-sodium diet Check your weight on daily basis Follow-up with neurology service in 4 weeks Follow-up with cardiology service as previously instructed.     Allergies as of 08/16/2020      Reactions   Ivp Dye [iodinated Diagnostic Agents] Rash      Medication List    STOP taking these medications   clotrimazole-betamethasone cream Commonly known as: Lotrisone   terbinafine 1 % cream Commonly known as: LamISIL AT     TAKE these medications   acetaminophen 650 MG CR tablet Commonly known as: TYLENOL Take 1,300 mg by mouth every 8 (eight) hours as needed for pain.   amLODipine 10 MG tablet Commonly known as: NORVASC Take 10 mg by mouth daily.   apixaban 5 MG Tabs tablet Commonly known as: ELIQUIS Take 1 tablet (5 mg total) by mouth 2 (two) times daily.   atorvastatin 80 MG tablet Commonly known as: LIPITOR Take 80 mg by mouth every evening.   ferrous sulfate 325 (65 FE) MG tablet Take 1 tablet (325 mg total) by mouth daily with breakfast.   insulin aspart protamine- aspart (70-30) 100 UNIT/ML injection Commonly known as: NOVOLOG MIX 70/30 42 units in the morning and 50 units at night.   losartan 100 MG tablet Commonly known  as: COZAAR Take 100 mg by mouth daily.   metFORMIN 1000 MG tablet Commonly known as: GLUCOPHAGE Take 1,000 mg by mouth 2 (two) times daily with a meal.   Metoprolol Tartrate 37.5 MG Tabs Take 37.5 mg by mouth 2 (two) times daily.   potassium chloride 10 MEQ tablet Commonly known as: KLOR-CON Take 20 mEq by mouth daily.   spironolactone 25 MG tablet Commonly known as: ALDACTONE Take 25 mg by mouth  daily.   tamsulosin 0.4 MG Caps capsule Commonly known as: FLOMAX Take 2 capsules (0.8 mg total) by mouth every evening.   torsemide 20 MG tablet Commonly known as: Demadex Take 1 tablet (20 mg total) by mouth daily. May take extra 20mg  tablet daily if weight gain of 3lbs or more noted in 24 hour period.   Vitamin B 12 500 MCG Tabs Take 1,000 mcg by mouth daily.      Allergies  Allergen Reactions  . Ivp Dye [Iodinated Diagnostic Agents] Rash    Follow-up Information    [facility] Schedule an appointment as soon as possible for a visit in 10 day(s).   Contact information: [place] [address] [phone]        Satira Sark, MD .   Specialty: Cardiology Contact information: [place] [address] [phone]        Phillips Odor, MD. Schedule an appointment as soon as possible for a visit in 4 week(s).   Specialty: Neurology Contact information: [address] [place] [address] [phone]               The results of significant diagnostics from this hospitalization (including imaging, microbiology, ancillary and laboratory) are listed below for reference.    Significant Diagnostic Studies: CT Head Wo Contrast  Result Date: 08/15/2020 CLINICAL DATA:  Dizziness EXAM: CT HEAD WITHOUT CONTRAST TECHNIQUE: Contiguous axial images were obtained from the base of the skull through the vertex without intravenous contrast. COMPARISON:  11/20/2013 FINDINGS: Brain: No evidence of acute infarction, hemorrhage, hydrocephalus, extra-axial collection or mass lesion/mass effect. Remote lacunar infarcts in the bilateral basal ganglia. Moderate low-density changes within the periventricular and subcortical white matter compatible with chronic microvascular ischemic change. Mild diffuse cerebral volume loss. Vascular: Atherosclerotic calcifications involving the large vessels of the skull base.  No unexpected hyperdense vessel. Skull: Normal. Negative for fracture or focal lesion. Sinuses/Orbits: No acute finding. Other: None. IMPRESSION: 1. No acute intracranial findings. 2. Chronic microvascular ischemic change and cerebral volume loss. Electronically Signed   By: Davina Poke D.O.   On: 08/15/2020 14:47   MR ANGIO HEAD WO CONTRAST  Result Date: 08/15/2020 CLINICAL DATA:  Stroke follow-up EXAM: MRA HEAD WITHOUT CONTRAST TECHNIQUE: Angiographic images of the Circle of Willis were obtained using MRA technique without intravenous contrast. COMPARISON:  MRI head 08/15/2020 FINDINGS: Both vertebral arteries patent to the basilar. Atherosclerotic irregularity and mild stenosis mid basilar. Superior cerebellar and posterior cerebral arteries patent. Mild stenosis in the posterior cerebral artery bilaterally, left P2 segment and right P3 segment. Internal carotid artery widely patent bilaterally. Patent posterior communicating artery bilaterally, right greater than left. Anterior and middle cerebral arteries patent bilaterally. No significant stenosis or large vessel occlusion Negative for cerebral aneurysm. IMPRESSION: Mild intracranial atherosclerotic disease. No large vessel occlusion. Electronically Signed   By: Franchot Gallo M.D.   On: 08/15/2020 16:01   MR BRAIN WO CONTRAST  Result Date: 08/15/2020 CLINICAL DATA:  Stroke, follow-up EXAM: MRI  HEAD WITHOUT CONTRAST TECHNIQUE: Multiplanar, multiecho pulse sequences of the brain and surrounding structures were obtained without intravenous contrast. COMPARISON:  2015 FINDINGS: Brain: Two small foci of mildly reduced diffusion in the corona radiata bilaterally. No evidence of hemorrhage. Prominence of the ventricles and sulci reflects generalized parenchymal volume loss. Patchy and confluent areas of T2 hyperintensity in the supratentorial and pontine white matter are nonspecific but probably reflect moderate to marked chronic microvascular  ischemic changes. These findings have progressed since 2015. Multiple chronic infarcts are again identified including involvement of the left brachium pontis, thalami, and central white matter. There is no hydrocephalus or extra-axial fluid collection. Vascular: Major vessel flow voids at the skull base are preserved. Skull and upper cervical spine: Normal marrow signal is preserved. Sinuses/Orbits: Minor mucosal thickening. Bilateral lens replacements. Other: Sella is unremarkable.  Mastoid air cells are clear. IMPRESSION: Two small acute to subacute infarcts involving the corona radiata bilaterally. Progression of chronic microvascular ischemic changes and parenchymal volume loss since 2015. Chronic small vessel infarcts. Electronically Signed   By: Macy Mis M.D.   On: 08/15/2020 16:05   US Carotid Bilateral (at [facility] and [facility] only)  Result Date: 08/16/2020 CLINICAL DATA:  79 year old male with history of stroke. EXAM: BILATERAL CAROTID DUPLEX ULTRASOUND TECHNIQUE: Pearline Cables scale imaging, color Doppler and duplex ultrasound were performed of bilateral carotid and vertebral arteries in the neck. COMPARISON:  08/24/2013 FINDINGS: Criteria: Quantification of carotid stenosis is based on velocity parameters that correlate the residual internal carotid diameter with NASCET-based stenosis levels, using the diameter of the distal internal carotid lumen as the denominator for stenosis measurement. The following velocity measurements were obtained: RIGHT ICA: Peak systolic velocity 90 cm/sec, End diastolic velocity 12 cm/sec CCA: Peak systolic velocity 75 cm/sec SYSTOLIC ICA/CCA RATIO:  1.2 ECA: Peak systolic velocity 95 cm/sec LEFT ICA: Peak systolic velocity 163 cm/sec, End diastolic velocity 8 cm/sec CCA: 845 cm/sec SYSTOLIC ICA/CCA RATIO:  1.1 ECA: 193 cm/sec RIGHT CAROTID ARTERY: Mild multifocal and irregular atherosclerotic plaque formation in the distal common, carotid bulb, and proximal internal carotid  arteries. No significant tortuosity. Normal low resistance waveforms. RIGHT VERTEBRAL ARTERY:  Antegrade flow. LEFT CAROTID ARTERY: Mild multifocal irregular atherosclerotic plaque formation in the distal common, carotid bulb, and bifurcation. No significant tortuosity. Normal low resistance waveforms. LEFT VERTEBRAL ARTERY:  Antegrade flow. Upper extremity non-invasive blood pressures: Not obtained. IMPRESSION: 1. Right carotid artery system: Less than 50% stenosis secondary to atherosclerotic plaque formation, not significantly progressed since 2014. 2. Left carotid artery system: Less than 50% stenosis secondary to atherosclerotic plaque formation, not significantly progressed since 2014. 3.  Vertebral artery system: Patent with antegrade flow bilaterally. Ruthann Cancer, MD [facility] Electronically Signed   By: Ruthann Cancer MD   On: 08/16/2020 11:26    Microbiology: Recent Results (from the past 240 hour(s))  Respiratory Panel by RT PCR (Flu A&B, Covid) - Nasopharyngeal Swab     Status: None   Collection Time: 08/15/20  4:40 PM   Specimen: Nasopharyngeal Swab  Result Value Ref Range Status   SARS Coronavirus 2 by RT PCR NEGATIVE NEGATIVE Final    Comment: (NOTE) SARS-CoV-2 target nucleic acids are NOT DETECTED.  The SARS-CoV-2 RNA is generally detectable in upper respiratoy specimens during the acute phase of infection. The lowest concentration of SARS-CoV-2 viral copies this assay can detect is 131 copies/mL. A negative result does not preclude SARS-Cov-2 infection and should not be used as the sole basis  for treatment or other patient management decisions. A negative result may occur with  improper specimen collection/handling, submission of specimen other than nasopharyngeal swab, presence of viral mutation(s) within the areas targeted by this assay, and inadequate number of viral copies (<131 copies/mL). A negative result must  be combined with clinical observations, patient history, and epidemiological information. The expected result is Negative.  Fact Sheet for Patients:  PinkCheek.be  Fact Sheet for Healthcare Providers:  GravelBags.it  This test is no t yet approved or cleared by the [place] FDA and  has been authorized for detection and/or diagnosis of SARS-CoV-2 by FDA under an Emergency Use Authorization (EUA). This EUA will remain  in effect (meaning this test can be used) for the duration of the COVID-19 declaration under Section 564(b)(1) of the Act, 21 U.S.C. section 360bbb-3(b)(1), unless the authorization is terminated or revoked sooner.     Influenza A by PCR NEGATIVE NEGATIVE Final   Influenza B by PCR NEGATIVE NEGATIVE Final    Comment: (NOTE) The Xpert Xpress SARS-CoV-2/FLU/RSV assay is intended as an aid in  the diagnosis of influenza from Nasopharyngeal swab specimens and  should not be used as a sole basis for treatment. Nasal washings and  aspirates are unacceptable for Xpert Xpress SARS-CoV-2/FLU/RSV  testing.  Fact Sheet for Patients: PinkCheek.be  Fact Sheet for Healthcare Providers: GravelBags.it  This test is not yet approved or cleared by the [place] FDA and  has been authorized for detection and/or diagnosis of SARS-CoV-2 by  FDA under an Emergency Use Authorization (EUA). This EUA will remain  in effect (meaning this test can be used) for the duration of the  Covid-19 declaration under Section 564(b)(1) of the Act, 21  U.S.C. section 360bbb-3(b)(1), unless the authorization is  terminated or revoked. Performed at [facility], [address], [place], [place] [address]      Labs: Basic Metabolic Panel: Recent Labs  Lab 08/15/20 1413  NA 137  K 4.2  CL 105  CO2 22  GLUCOSE 155*  BUN 30*  CREATININE 1.72*  CALCIUM 9.2   Liver  Function Tests: Recent Labs  Lab 08/15/20 1413  AST 21  ALT 23  ALKPHOS 44  BILITOT 0.6  PROT 7.4  ALBUMIN 3.6   CBC: Recent Labs  Lab 08/15/20 1413  WBC 6.6  NEUTROABS 3.6  HGB 11.3*  HCT 36.2*  MCV 100.0  PLT 182   BNP (last 3 results) Recent Labs    10/28/19 1229  BNP 66.0   CBG: Recent Labs  Lab 08/16/20 0824  GLUCAP 214*    Signed:  Barton Dubois MD.  [facility] 08/16/2020, 3:30 PM

## 2020-08-17 LAB — URINE CULTURE: Culture: NO GROWTH

## 2020-08-18 ENCOUNTER — Telehealth: Payer: Self-pay

## 2020-08-18 ENCOUNTER — Other Ambulatory Visit: Payer: Self-pay | Admitting: Family Medicine

## 2020-08-18 DIAGNOSIS — I639 Cerebral infarction, unspecified: Secondary | ICD-10-CM

## 2020-08-18 LAB — HEMOGLOBIN A1C
Hgb A1c MFr Bld: 8.4 % — ABNORMAL HIGH (ref 4.8–5.6)
Mean Plasma Glucose: 194 mg/dL

## 2020-08-18 MED ORDER — POTASSIUM CHLORIDE ER 10 MEQ PO TBCR: 20.0000 meq | EXTENDED_RELEASE_TABLET | Freq: Every day | ORAL | 3 refills | Status: DC

## 2020-08-18 NOTE — Telephone Encounter (Signed)
Prescription sent to pharmacy.

## 2020-08-18 NOTE — Telephone Encounter (Signed)
It looks as if referrals were placed from hospital.   Patient wife states that she attempted to schedule F/U with Dr. Merlene Laughter and was advised no referral was placed.   MD please advise.

## 2020-08-18 NOTE — Telephone Encounter (Signed)
Orders placed.

## 2020-08-18 NOTE — Telephone Encounter (Addendum)
Cb# [phone] Pt need a referral to see [facility], [place], MD was admit to the hospital for a mini strokes was told schedule appt with a neurologist. He also need a referral to see [facility] in [place]

## 2020-08-18 NOTE — Telephone Encounter (Signed)
Refill Potassium Chloride sent [facility] in [place]

## 2020-08-20 ENCOUNTER — Telehealth: Payer: Self-pay | Admitting: Cardiology

## 2020-08-20 NOTE — Telephone Encounter (Signed)
Charles Sark, [name]  You; Bernita Raisin, RN 5 minutes ago (3:29 PM)     Per the discharge summary, orders were placed by hospitalist for home PT referral.  Not certain whether this is something that may actually need to be done through his [place] provider since he still follows through the [facility] hospital system.      Called and spoke to the patient and his wife and informed them of the above. They verbalized understanding and will call back with any new concerns/questions.

## 2020-08-20 NOTE — Telephone Encounter (Signed)
New message     Spoke with patient wife to set up hospital follow up , she would like a call back about getting a referral for Physcial therapy set up through the [place] , she thought they were setting it up when he was discharged from the hospital, he is having trouble walking and falling

## 2020-08-20 NOTE — Telephone Encounter (Signed)
Per the discharge summary, orders were placed by hospitalist for home PT referral.  Not certain whether this is something that may actually need to be done through his [place] provider since he still follows through the [facility] hospital system.

## 2020-08-25 NOTE — Progress Notes (Signed)
Cardiology Office Note    Date:  08/27/2020   ID:  Charles, [name] 1941/08/01, MRN [id number]  PCP:  Alycia Rossetti, MD  Cardiologist: Rozann Lesches, MD EPS: None  Chief Complaint  Patient presents with  . Follow-up    History of Present Illness:  [name] is a 79 y.o. male with history of CAD S/P CABG 2007, PAF/flutter on Eliquis and metoprolol, CKD stage IIIb, OSA on CPAP, obesity.  Patient saw Dr. Domenic Polite 08/14/2020 and was doing well.  Unfortunately the next day 08/15/2020 the patient was admitted to the ER with acute embolic CVA and admitted to being out of his Eliquis for several days-was waiting to get it from the [place].  Neurology recommended resuming Eliquis and secondary risk modification.  2D echo LVEF 60 to 65% with moderate LVH.  Patient comes in for f/u with his wife. Legs are still weak. Awaiting for PT to be approved from the [place]. Using a walker, wheelchair. Sees neurology in Nov. A rash started on his neck and left ant elbow but got much worse after hospitalization and peeling. Didn't receive any new meds in hospital and was started on Eliquis in July. No other new meds. No cardiac complaints.   Past Medical History:  Diagnosis Date  . Anemia   . C. difficile diarrhea   . CKD (chronic kidney disease), stage III ([place])   . Coronary artery disease    Status post CABG in 2007 [facility] [place])  . 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 GERD (gastroesophageal reflux disease)   . Glaucoma   . Hyperlipidemia   . Hypertension   . Morbid obesity ([place])   . PAF (paroxysmal atrial fibrillation) ([place])   . Peripheral vascular disease ([place])   . Sleep apnea   . Stroke [facility]) 2008, 2014, 2015    Past Surgical History:  Procedure Laterality Date  . CHOLECYSTECTOMY    . CORONARY ARTERY BYPASS GRAFT    . NO PAST SURGERIES    . THYROID SURGERY      Current Medications: Current Meds  Medication Sig  .  acetaminophen (TYLENOL) 650 MG CR tablet Take 1,300 mg by mouth every 8 (eight) hours as needed for pain.  [name] amLODipine (NORVASC) 10 MG tablet Take 10 mg by mouth daily.  [name] apixaban (ELIQUIS) 5 MG TABS tablet Take 1 tablet (5 mg total) by mouth 2 (two) times daily.  [name] atorvastatin (LIPITOR) 80 MG tablet Take 80 mg by mouth every evening.   . Cyanocobalamin (VITAMIN B 12) 500 MCG TABS Take 1,000 mcg by mouth daily.  . ferrous sulfate 325 (65 FE) MG tablet Take 1 tablet (325 mg total) by mouth daily with breakfast.  . insulin aspart protamine- aspart (NOVOLOG MIX 70/30) (70-30) 100 UNIT/ML injection 42 units in the morning and 50 units at night.  . losartan (COZAAR) 100 MG tablet Take 100 mg by mouth daily.    . metFORMIN (GLUCOPHAGE) 1000 MG tablet Take 1,000 mg by mouth 2 (two) times daily with a meal.    . metoprolol tartrate 37.5 MG TABS Take 37.5 mg by mouth 2 (two) times daily.  . potassium chloride (KLOR-CON) 10 MEQ tablet Take 2 tablets (20 mEq total) by mouth daily.  [name] spironolactone (ALDACTONE) 25 MG tablet Take 25 mg by mouth daily.  . tamsulosin (FLOMAX) 0.4 MG CAPS capsule Take 2 capsules (0.8 mg total) by mouth every evening.  . torsemide (  DEMADEX) 20 MG tablet Take 1 tablet (20 mg total) by mouth daily. May take extra 20mg  tablet daily if weight gain of 3lbs or more noted in 24 hour period.     Allergies:   Ivp dye [iodinated diagnostic agents]   Social History   Socioeconomic History  . Marital status: Married    Spouse name: Not on file  . Number of children: Not on file  . Years of education: Not on file  . Highest education level: Not on file  Occupational History  . Not on file  Tobacco Use  . Smoking status: Former Smoker    Types: Cigarettes  . Smokeless tobacco: Never Used  Vaping Use  . Vaping Use: Never used  Substance and Sexual Activity  . Alcohol use: No  . Drug use: No  . Sexual activity: Not Currently  Other Topics Concern  . Not on file  Social  History Narrative  . Not on file   Social Determinants of Health   Financial Resource Strain:   . Difficulty of Paying Living Expenses: Not on file  Food Insecurity:   . Worried About Charity fundraiser in the Last Year: Not on file  . Ran Out of Food in the Last Year: Not on file  Transportation Needs:   . Lack of Transportation (Medical): Not on file  . Lack of Transportation (Non-Medical): Not on file  Physical Activity:   . Days of Exercise per Week: Not on file  . Minutes of Exercise per Session: Not on file  Stress:   . Feeling of Stress : Not on file  Social Connections:   . Frequency of Communication with Friends and Family: Not on file  . Frequency of Social Gatherings with Friends and Family: Not on file  . Attends Religious Services: Not on file  . Active Member of Clubs or Organizations: Not on file  . Attends Archivist Meetings: Not on file  . Marital Status: Not on file     Family History:  The patient's family history includes Diabetes in his brother, father, mother, and sister; Heart failure in his brother, father, mother, and sister; Hyperlipidemia in his sister; Hypertension in his brother, father, mother, and sister.   ROS:   Please see the history of present illness.    ROS All other systems reviewed and are negative.   PHYSICAL EXAM:   VS:  BP (!) 148/68   Pulse 68   Ht 5\' 8"  (1.727 m)   Wt 260 lb 6.4 oz (118.1 kg)   SpO2 97%   BMI 39.59 kg/m   Physical Exam  GEN: Obese, in no acute distress  Neck: no JVD, carotid bruits, or masses Cardiac:RRR; no murmurs, rubs, or gallops  Respiratory:  clear to auscultation bilaterally, normal work of breathing GI: soft, nontender, nondistended, + BS Ext: without cyanosis, clubbing, or edema, Good distal pulses bilaterally Neuro:  Alert and Oriented x 3 Psych: euthymic mood, full affect  Wt Readings from Last 3 Encounters:  08/27/20 260 lb 6.4 oz (118.1 kg)  08/15/20 258 lb 6.4 oz (117.2 kg)   08/14/20 264 lb (119.7 kg)      Studies/Labs Reviewed:   EKG:  EKG is not ordered today.  The ekg in the hospital 08/15/2020 normal sinus rhythm Recent Labs: 10/28/2019: B Natriuretic Peptide 66.0 05/01/2020: TSH 2.437 05/03/2020: Magnesium 2.0 08/15/2020: ALT 23; BUN 30; Creatinine, Ser 1.72; Hemoglobin 11.3; Platelets 182; Potassium 4.2; Sodium 137   Lipid Panel  Component Value Date/Time   CHOL 128 08/16/2020 0513   TRIG 110 08/16/2020 0513   HDL 30 (L) 08/16/2020 0513   CHOLHDL 4.3 08/16/2020 0513   VLDL 22 08/16/2020 0513   LDLCALC 76 08/16/2020 0513   LDLCALC 98 03/07/2020 1158    Additional studies/ records that were reviewed today include:  2D echo 08/16/2020 IMPRESSIONS     1. Left ventricular ejection fraction, by estimation, is 60 to 65%. The  left ventricle has normal function. The left ventricle has no regional  wall motion abnormalities. There is moderate left ventricular hypertrophy.  Left ventricular diastolic  parameters were normal.   2. Right ventricular systolic function is normal. The right ventricular  size is normal.   3. The mitral valve is grossly normal. No evidence of mitral valve  regurgitation. Moderate mitral annular calcification.   4. The aortic valve is tricuspid. There is mild calcification of the  aortic valve. Aortic valve regurgitation is not visualized. No aortic  stenosis is present.   Conclusion(s)/Recommendation(s): Study is nondiagnostic for the assessment  of stroke given poor acoustic windows. Consider TEE if clinically  indicated.   FINDINGS   Left Ventricle: Left ventricular ejection fraction, by estimation, is 60  to 65%. The left ventricle has normal function. The left ventricle has no  regional wall motion abnormalities. Definity contrast agent was given IV  to delineate the left ventricular   endocardial borders. The left ventricular internal cavity size was normal  in size. There is moderate left ventricular  hypertrophy. Left ventricular  diastolic parameters were normal.   Right Ventricle: The right ventricular size is normal. Right vetricular  wall thickness was not well visualized. Right ventricular systolic  function is normal.   Left Atrium: Left atrial size was not well visualized.   Right Atrium: Right atrial size was not well visualized.   Pericardium: There is no evidence of pericardial effusion.   Mitral Valve: The mitral valve is grossly normal. Moderate mitral annular  calcification. No evidence of mitral valve regurgitation.   Tricuspid Valve: The tricuspid valve is normal in structure. Tricuspid  valve regurgitation is not demonstrated.   Aortic Valve: The aortic valve is tricuspid. There is mild calcification  of the aortic valve. Aortic valve regurgitation is not visualized. No  aortic stenosis is present.   Pulmonic Valve: The pulmonic valve was not well visualized.   Aorta: The aortic root is normal in size and structure.   Venous: The inferior vena cava was not well visualized.   IAS/Shunts: The interatrial septum was not well visualized.         ASSESSMENT:    1. Acute embolic stroke ([place])   2. Coronary artery disease involving coronary bypass graft of native heart without angina pectoris   3. Paroxysmal atrial fibrillation (HCC)   4. Stage 3 chronic kidney disease, unspecified whether stage 3a or 3b CKD (HCC)   5. Class 2 severe obesity due to excess calories with serious comorbidity and body mass index (BMI) of 39.0 to 39.9 in adult ([place])   6. Essential hypertension   7. Rash   8. Hyperlipidemia, unspecified hyperlipidemia type      PLAN:  In order of problems listed above:  Embolic CVA 83/38/2505 after missing several Eliquis doses while awaiting his medication from the [place].  I told him not to miss any doses and to call us if he is running low.  He is going to start physical therapy.  Still very weak in the  legs and using a walker  CAD S/P CABG  2007, NST 05/2020 low risk, no ischemia. Last echo LVEF 55-60% no angina  PAF/flutter converted NSR spontaneously on Eliquis and metoprolol heart rate regular today and was in normal sinus rhythm 08/15/2020  CKD stage 3b creatinine 1.72 08/15/2020  Obesity  Hypertension blood pressure slightly up today.  I asked the patient to monitor this closely at home.  Rash started before the hospitalization but much worse since.  On the anterior of his elbow and neck.  Almost looks like psoriasis.  We will send him to dermatology.  He started Eliquis in July but no other medication changes. No new soaps or detergents.  Hyperlipidemia LDL 76 on Lipitor 80 mg once daily    Medication Adjustments/Labs and Tests Ordered: Current medicines are reviewed at length with the patient today.  Concerns regarding medicines are outlined above.  Medication changes, Labs and Tests ordered today are listed in the Patient Instructions below. Patient Instructions  Medication Instructions:  Your physician recommends that you continue on your current medications as directed. Please refer to the Current Medication list given to you today.  *If you need a refill on your cardiac medications before your next appointment, please call your pharmacy*   Lab Work: None If you have labs (blood work) drawn today and your tests are completely normal, you will receive your results only by: [name] MyChart Message (if you have MyChart) OR . A paper copy in the mail If you have any lab test that is abnormal or we need to change your treatment, we will call you to review the results.   Follow-Up: At [facility], you and your health needs are our priority.  As part of our continuing mission to provide you with exceptional heart care, we have created designated Provider Care Teams.  These Care Teams include your primary Cardiologist (physician) and Advanced Practice Providers (APPs -  Physician Assistants and Nurse Practitioners) who  all work together to provide you with the care you need, when you need it.  We recommend signing up for the patient portal called "MyChart".  Sign up information is provided on this After Visit Summary.  MyChart is used to connect with patients for Virtual Visits (Telemedicine).  Patients are able to view lab/test results, encounter notes, upcoming appointments, etc.  Non-urgent messages can be sent to your provider as well.   To learn more about what you can do with MyChart, go to [email or web address].    Your next appointment:   3-4 month(s)  The format for your next appointment:   In Person  Provider:   You may see Rozann Lesches, MD or one of the following Advanced Practice Providers on your designated Care Team:    Bernerd Pho, PA-C   Ermalinda Barrios, PA-C     Other Instructions Check your Blood Pressure regularly and keep a log of it.  You have been referred to a Dermatologist. They will contact you to schedule an appointment.     Sumner Boast, PA-C  08/27/2020 11:28 AM    [facility] [address], [place], [place]  [address] Phone: [phone]; Fax: [phone]

## 2020-08-27 ENCOUNTER — Encounter: Payer: Self-pay | Admitting: Physician Assistant

## 2020-08-27 ENCOUNTER — Ambulatory Visit (INDEPENDENT_AMBULATORY_CARE_PROVIDER_SITE_OTHER): Payer: Medicare Other | Admitting: Physician Assistant

## 2020-08-27 ENCOUNTER — Other Ambulatory Visit: Payer: Self-pay

## 2020-08-27 VITALS — BP 148/68 | HR 68 | Ht 68.0 in | Wt 260.4 lb

## 2020-08-27 DIAGNOSIS — Z6839 Body mass index (BMI) 39.0-39.9, adult: Secondary | ICD-10-CM

## 2020-08-27 DIAGNOSIS — N183 Chronic kidney disease, stage 3 unspecified: Secondary | ICD-10-CM | POA: Diagnosis not present

## 2020-08-27 DIAGNOSIS — E785 Hyperlipidemia, unspecified: Secondary | ICD-10-CM

## 2020-08-27 DIAGNOSIS — I25810 Atherosclerosis of coronary artery bypass graft(s) without angina pectoris: Secondary | ICD-10-CM

## 2020-08-27 DIAGNOSIS — R21 Rash and other nonspecific skin eruption: Secondary | ICD-10-CM

## 2020-08-27 DIAGNOSIS — I480 Paroxysmal atrial fibrillation: Secondary | ICD-10-CM

## 2020-08-27 DIAGNOSIS — E66812 Obesity, class 2: Secondary | ICD-10-CM

## 2020-08-27 DIAGNOSIS — I10 Essential (primary) hypertension: Secondary | ICD-10-CM

## 2020-08-27 DIAGNOSIS — I639 Cerebral infarction, unspecified: Secondary | ICD-10-CM

## 2020-08-27 NOTE — Patient Instructions (Signed)
Medication Instructions:  Your physician recommends that you continue on your current medications as directed. Please refer to the Current Medication list given to you today.  *If you need a refill on your cardiac medications before your next appointment, please call your pharmacy*   Lab Work: None If you have labs (blood work) drawn today and your tests are completely normal, you will receive your results only by: [name] MyChart Message (if you have MyChart) OR . A paper copy in the mail If you have any lab test that is abnormal or we need to change your treatment, we will call you to review the results.   Follow-Up: At [facility], you and your health needs are our priority.  As part of our continuing mission to provide you with exceptional heart care, we have created designated Provider Care Teams.  These Care Teams include your primary Cardiologist (physician) and Advanced Practice Providers (APPs -  Physician Assistants and Nurse Practitioners) who all work together to provide you with the care you need, when you need it.  We recommend signing up for the patient portal called "MyChart".  Sign up information is provided on this After Visit Summary.  MyChart is used to connect with patients for Virtual Visits (Telemedicine).  Patients are able to view lab/test results, encounter notes, upcoming appointments, etc.  Non-urgent messages can be sent to your provider as well.   To learn more about what you can do with MyChart, go to [email or web address].    Your next appointment:   3-4 month(s)  The format for your next appointment:   In Person  Provider:   You may see Rozann Lesches, MD or one of the following Advanced Practice Providers on your designated Care Team:    Bernerd Pho, PA-C   Ermalinda Barrios, PA-C     Other Instructions Check your Blood Pressure regularly and keep a log of it.  You have been referred to a Dermatologist. They will contact you to schedule an  appointment.

## 2020-09-02 ENCOUNTER — Other Ambulatory Visit: Payer: Self-pay

## 2020-09-02 ENCOUNTER — Ambulatory Visit (INDEPENDENT_AMBULATORY_CARE_PROVIDER_SITE_OTHER): Payer: Medicare Other | Admitting: Family Medicine

## 2020-09-02 ENCOUNTER — Ambulatory Visit: Payer: Medicare Other | Admitting: Family Medicine

## 2020-09-02 ENCOUNTER — Encounter: Payer: Self-pay | Admitting: Family Medicine

## 2020-09-02 VITALS — BP 130/58 | HR 70 | Temp 98.5°F | Resp 10 | Ht 68.0 in | Wt 258.0 lb

## 2020-09-02 DIAGNOSIS — L309 Dermatitis, unspecified: Secondary | ICD-10-CM | POA: Diagnosis not present

## 2020-09-02 DIAGNOSIS — L304 Erythema intertrigo: Secondary | ICD-10-CM | POA: Diagnosis not present

## 2020-09-02 DIAGNOSIS — I69398 Other sequelae of cerebral infarction: Secondary | ICD-10-CM

## 2020-09-02 DIAGNOSIS — Z794 Long term (current) use of insulin: Secondary | ICD-10-CM

## 2020-09-02 DIAGNOSIS — L720 Epidermal cyst: Secondary | ICD-10-CM

## 2020-09-02 DIAGNOSIS — E1122 Type 2 diabetes mellitus with diabetic chronic kidney disease: Secondary | ICD-10-CM

## 2020-09-02 DIAGNOSIS — L308 Other specified dermatitis: Secondary | ICD-10-CM | POA: Diagnosis not present

## 2020-09-02 DIAGNOSIS — N183 Chronic kidney disease, stage 3 unspecified: Secondary | ICD-10-CM

## 2020-09-02 DIAGNOSIS — R2689 Other abnormalities of gait and mobility: Secondary | ICD-10-CM

## 2020-09-02 MED ORDER — POTASSIUM CHLORIDE ER 10 MEQ PO TBCR
20.0000 meq | EXTENDED_RELEASE_TABLET | Freq: Every day | ORAL | 3 refills | Status: DC
Start: 2020-09-02 — End: 2021-07-17

## 2020-09-02 NOTE — Assessment & Plan Note (Signed)
Using walker, Continue with PT

## 2020-09-02 NOTE — Progress Notes (Signed)
Subjective:    Patient ID: [name], male    DOB: [birth year], 79 y.o.   MRN: [id number]  Patient presents for Rash (Wife said that he has a rash on his buttocks that appears to be ulcerated. It has been there for a while but getting much worse. Sees dermatology but said that they have not checked this area. )  Patient here to follow-up hospitalization also here with rash.  Was in the hospital with acute embolic stroke.  He was off of his Eliquis for a week as he was trying to obtain this from the [place].  He is now back on his medications and his blood pressure has improved.  He has an appointment next week at the [place] with his primary.  He is already been evaluated by cardiology and had his medications checked.  Unfortunately when he came into the hospital he broke out in a rash which has happened the last couple times he has been in the hospital.  He now has a rash around his neck and her elbows his groin beneath the pannus.  He was seen by dermatology today they prescribed him Diprolene however if he is unable to afford this.  They also gave him a compounded cream of Nizoral and Cutivate however his pharmacy cannot compound this.  They are going to take the prescription to the [facility] to see if they can be covered for cheaper price.  Wife also wanted me to look at a spot on his right buttocks.  States that it swells at bedtime she has been able to get some "black stuff out of it it has been there for years.  It seemed  to be a little bit bigger.   DM-since his stroke since he has been I am able to try daily progress from his blood sugars have been great per report.  He is actually had hypoglycemia in the morning CBG 80's .  He is currently taking iNSULIN 70/30 - 42/ 50 evening  A1C returned at 8.4% in the hospital    He is getting HH/PT  Review Of Systems:  GEN- denies fatigue, fever, weight loss,weakness, recent illness HEENT- denies eye drainage, change in vision, nasal discharge, CVS- denies  chest pain, palpitations RESP- denies SOB, cough, wheeze ABD- denies N/V, change in stools, abd pain GU- denies dysuria, hematuria, dribbling, incontinence MSK- denies joint pain, muscle aches, injury Neuro- denies headache, dizziness, syncope, seizure activity       Objective:    BP (!) 130/58   Pulse 70   Temp 98.5 F (36.9 C)   Resp 10   Ht 5\' 8"  (1.727 m)   Wt 258 lb (117 kg)   SpO2 98%   BMI 39.23 kg/m  GEN- NAD, alert and oriented x3 HEENT- PERRL, EOMI, non injected sclera, pink conjunctiva, MMM, oropharynx clear Neck- Supple, no thyromegaly CVS- RRR, no murmur RESP-CTAB ABD-NABS,soft,NT,ND EXT- pedal  Edema Skin- cracking eczematous skin around neck, fossa bilat elbows, hyperpigmented scaley rash beneath pannus, in groin Right buttocks cyst with black core at center, non fluctuant, NT  Pulses- Radial,        Assessment & Plan:      Problem List Items Addressed This Visit      Unprioritized   DM (diabetes mellitus) type II controlled with renal manifestation ([place])    Now that he is following meal plan, his sugars have dropped Reduce bedtime insulin to 48 units to reduce morning hypoglycemia  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 Any transcriptional errors that result from this process are unintentional.

## 2020-09-02 NOTE — Assessment & Plan Note (Signed)
Now that he is following meal plan, his sugars have dropped Reduce bedtime insulin to 48 units to reduce morning hypoglycemia

## 2020-09-02 NOTE — Patient Instructions (Addendum)
Decrease nighttime insulin to 48 units Use hydrocortisone cream 1% twice a day with Cetaphil F/U as previous

## 2020-09-29 ENCOUNTER — Telehealth: Payer: Self-pay | Admitting: Cardiology

## 2020-09-29 NOTE — Telephone Encounter (Signed)
NEW MESSAGE    Patient calling the office for samples of medication:   1.  What medication and dosage are you requesting samples for? ELIQUIS   2.  Are you currently out of this medication? YES BEEN OUT FOR 2 DAYS - THEY [facility] FORGOT TO SEND HIS PRESCRIPTION AND IT WILL BE 10 DAYS BEFORE HE GETS IT    IF WE DONT HAVE SAMPLES CAN YOU CALL INTO [facility] IN [place]

## 2020-09-29 NOTE — Telephone Encounter (Signed)
New message     Called again wanting samples

## 2020-09-29 NOTE — Telephone Encounter (Signed)
Called to notify pt that samples are ready to be picked up. No answer, voicemail not set up.

## 2020-09-30 NOTE — Telephone Encounter (Signed)
Wife notified that samples of Eliquis are ready to be picked up.

## 2020-10-14 ENCOUNTER — Other Ambulatory Visit: Payer: Self-pay

## 2020-10-14 ENCOUNTER — Encounter: Payer: Self-pay | Admitting: Family Medicine

## 2020-10-14 ENCOUNTER — Ambulatory Visit (INDEPENDENT_AMBULATORY_CARE_PROVIDER_SITE_OTHER): Payer: Medicare Other | Admitting: Family Medicine

## 2020-10-14 VITALS — BP 140/64 | HR 66 | Temp 98.1°F | Resp 16 | Ht 68.0 in | Wt 264.0 lb

## 2020-10-14 DIAGNOSIS — I5032 Chronic diastolic (congestive) heart failure: Secondary | ICD-10-CM

## 2020-10-14 DIAGNOSIS — I10 Essential (primary) hypertension: Secondary | ICD-10-CM

## 2020-10-14 DIAGNOSIS — N183 Chronic kidney disease, stage 3 unspecified: Secondary | ICD-10-CM

## 2020-10-14 DIAGNOSIS — Z8673 Personal history of transient ischemic attack (TIA), and cerebral infarction without residual deficits: Secondary | ICD-10-CM

## 2020-10-14 DIAGNOSIS — E1122 Type 2 diabetes mellitus with diabetic chronic kidney disease: Secondary | ICD-10-CM | POA: Diagnosis not present

## 2020-10-14 DIAGNOSIS — Z794 Long term (current) use of insulin: Secondary | ICD-10-CM

## 2020-10-14 NOTE — Progress Notes (Signed)
   Subjective:    Patient ID: [name], male    DOB: 1941/09/01, 79 y.o.   MRN: [id number]  Patient presents for Follow-up   Patient here for intermin f/u on chronic medical problems.    Chronic diastolic heart failure - has F/U in Feb with cardiology, he has noticed a little more fluid, but he sits all day, he has also been eating some foods higher in salt , no chest pain, no SOB   HTN- bp has been good   DM- Last A1C 8.4% inOCT   , highest CBG  212  , Insulin -  42  Units in AM  and 48 units  No recent hypoglycemia episodes Has recent labs at [facility] as well    History of CVA- seen by Neurology , he is getting PT/OT   No changes made , he has F/U in 3 months   Dermataitis on skin has dried out , he used nutragena soap which helped- dermatologist told her to use Cetaphil  , he has spot on face that only clears with nutragena  Seen by Eye doctor     Review Of Systems:  GEN- denies fatigue, fever, weight loss,weakness, recent illness HEENT- denies eye drainage, change in vision, nasal discharge, CVS- denies chest pain, palpitations RESP- denies SOB, cough, wheeze ABD- denies N/V, change in stools, abd pain GU- denies dysuria, hematuria, dribbling, incontinence MSK- denies joint pain, muscle aches, injury Neuro- denies headache, dizziness, syncope, seizure activity       Objective:    BP 140/64   Pulse 66   Temp 98.1 F (36.7 C) (Temporal)   Resp 16   Ht 5\' 8"  (1.727 m)   Wt 264 lb (119.7 kg)   SpO2 99%   BMI 40.14 kg/m  GEN- NAD, alert and oriented x3, walks with cane  HEENT- PERRL, EOMI, non injected sclera, pink conjunctiva, MMM, oropharynx clear, hypopigmentation wit mild scaliness corner of left nares Neck- Supple, no LAD  CVS- RRR, no murmur RESP-CTAB ABD-NABS,soft,NT,ND EXT- pedal  Edema L >R  Skin- , hyperpigmented skin beneath pannus and upper back Neuro- CNII-XII grossly in tact, decreased Strength upper and lower ext, walks with assistance  Pulses-  Radial,        Assessment & Plan:      Problem List Items Addressed This Visit      Unprioritized   Chronic diastolic heart failure (HCC)    Weight up a few pounds, respiratory symptoms.  Discussed dietary changes again.  Also elevated BP when sitting.  I think will be beneficial for him to get up and try to do the exercises physical therapy is also recommended in the setting of his stroke      CKD stage 3 due to type 2 diabetes mellitus ([place])   DM (diabetes mellitus) type II controlled with renal manifestation ([place]) - Primary    A1C has been stable No changes to insulin       History of stroke    Will obtain note from neurology  I do NOT recommend that he drive at this time      HTN (hypertension) (Chronic)    Has been controlled, continue to monitor Recent stroke systolic looks okay today No changes          Note: This dictation was prepared with Dragon dictation along with smaller phrase technology. Any transcriptional errors that result from this process are unintentional.

## 2020-10-14 NOTE — Assessment & Plan Note (Signed)
A1C has been stable No changes to insulin

## 2020-10-14 NOTE — Assessment & Plan Note (Signed)
Weight up a few pounds, respiratory symptoms.  Discussed dietary changes again.  Also elevated BP when sitting.  I think will be beneficial for him to get up and try to do the exercises physical therapy is also recommended in the setting of his stroke

## 2020-10-14 NOTE — Patient Instructions (Signed)
Work on the exercises  Follow up with your specialist

## 2020-10-14 NOTE — Assessment & Plan Note (Addendum)
Will obtain note from neurology  I do NOT recommend that he drive at this time

## 2020-10-14 NOTE — Assessment & Plan Note (Signed)
Has been controlled, continue to monitor Recent stroke systolic looks okay today No changes

## 2020-11-05 ENCOUNTER — Telehealth: Payer: Self-pay | Admitting: Family Medicine

## 2020-11-05 MED ORDER — INSULIN ASPART PROT & ASPART (70-30 MIX) 100 UNIT/ML ~~LOC~~ SUSP: SUBCUTANEOUS | 0 refills | Status: DC

## 2020-11-05 MED ORDER — INSULIN ASPART PROT & ASPART (70-30 MIX) 100 UNIT/ML ~~LOC~~ SUSP
SUBCUTANEOUS | 0 refills | Status: DC
Start: 2020-11-05 — End: 2022-07-29

## 2020-11-05 NOTE — Telephone Encounter (Signed)
Prescription sent to pharmacy.

## 2020-11-05 NOTE — Telephone Encounter (Signed)
Vaughan Basta called in this morning requesting a refill on insulin called in to [facility] in [place]. She states he normally gets this from the [place] however he is out and needs a local prescription sent in.  CB# [phone]

## 2020-11-11 ENCOUNTER — Other Ambulatory Visit: Payer: Self-pay

## 2020-11-11 ENCOUNTER — Ambulatory Visit: Payer: Medicare Other | Admitting: Family Medicine

## 2020-12-24 DIAGNOSIS — I69359 Hemiplegia and hemiparesis following cerebral infarction affecting unspecified side: Secondary | ICD-10-CM | POA: Diagnosis not present

## 2020-12-24 DIAGNOSIS — Z87891 Personal history of nicotine dependence: Secondary | ICD-10-CM | POA: Diagnosis not present

## 2020-12-24 DIAGNOSIS — E1165 Type 2 diabetes mellitus with hyperglycemia: Secondary | ICD-10-CM | POA: Diagnosis not present

## 2020-12-24 DIAGNOSIS — Z299 Encounter for prophylactic measures, unspecified: Secondary | ICD-10-CM | POA: Diagnosis not present

## 2020-12-24 DIAGNOSIS — R609 Edema, unspecified: Secondary | ICD-10-CM | POA: Diagnosis not present

## 2020-12-24 DIAGNOSIS — I10 Essential (primary) hypertension: Secondary | ICD-10-CM | POA: Diagnosis not present

## 2020-12-30 ENCOUNTER — Encounter: Payer: Self-pay | Admitting: Cardiology

## 2020-12-30 ENCOUNTER — Ambulatory Visit (INDEPENDENT_AMBULATORY_CARE_PROVIDER_SITE_OTHER): Payer: Medicare Other | Admitting: Cardiology

## 2020-12-30 ENCOUNTER — Other Ambulatory Visit: Payer: Self-pay | Admitting: *Deleted

## 2020-12-30 VITALS — BP 160/58 | HR 61 | Ht 68.0 in | Wt 264.0 lb

## 2020-12-30 DIAGNOSIS — N1832 Chronic kidney disease, stage 3b: Secondary | ICD-10-CM | POA: Diagnosis not present

## 2020-12-30 DIAGNOSIS — I480 Paroxysmal atrial fibrillation: Secondary | ICD-10-CM | POA: Diagnosis not present

## 2020-12-30 DIAGNOSIS — I25119 Atherosclerotic heart disease of native coronary artery with unspecified angina pectoris: Secondary | ICD-10-CM

## 2020-12-30 MED ORDER — ISOSORBIDE MONONITRATE ER 30 MG PO TB24: 15.0000 mg | ORAL_TABLET | Freq: Every evening | ORAL | 3 refills | Status: DC

## 2020-12-30 NOTE — Progress Notes (Signed)
Cardiology Office Note  Date: 12/30/2020   ID: Charles, [name] [birth year], MRN [id number]  PCP:  Charles Blitz, MD  Cardiologist:  Charles Lesches, MD Electrophysiologist:  None   Chief Complaint  Patient presents with  . Cardiac follow-up    History of Present Illness: [name] is a 80 y.o. male last seen in October 2021 by Ms. Charles Public PA-C.  He is here with his wife for a follow-up visit.  He does not report any angina symptoms, but has had dyspnea on exertion more prevalent compared to 6 months ago.  He was impacted by stroke in October of last year.  He had 10 weeks of home PT, but has not been exercising regularly with lack of stamina.  He uses a cane to ambulate.  I reviewed his medications which are outlined below.  We discussed adding Imdur to current regimen.  He does not report any spontaneous bleeding problems on Eliquis.  I reviewed results from his carotid Dopplers and echocardiogram done in October 2021.  Past Medical History:  Diagnosis Date  . Anemia   . C. difficile diarrhea   . CKD (chronic kidney disease), stage III ([place])   . Coronary artery disease    Status post CABG in 2007 [facility] [place])  . 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 GERD (gastroesophageal reflux disease)   . Glaucoma   . Hyperlipidemia   . Hypertension   . Morbid obesity ([place])   . PAF (paroxysmal atrial fibrillation) ([place])   . Peripheral vascular disease ([place])   . Sleep apnea   . Stroke [facility]) 2008, 2014, 2015    Past Surgical History:  Procedure Laterality Date  . CHOLECYSTECTOMY    . CORONARY ARTERY BYPASS GRAFT    . NO PAST SURGERIES    . THYROID SURGERY      Current Outpatient Medications  Medication Sig Dispense Refill  . acetaminophen (TYLENOL) 650 MG CR tablet Take 1,300 mg by mouth every 8 (eight) hours as needed for pain.    . Adalimumab 40 MG/0.8ML PNKT Inject into the skin.    [name] amLODipine (NORVASC) 10 MG tablet  Take 10 mg by mouth daily.    [name] apixaban (ELIQUIS) 5 MG TABS tablet Take 1 tablet (5 mg total) by mouth 2 (two) times daily. 180 tablet 3  . atorvastatin (LIPITOR) 80 MG tablet Take 80 mg by mouth every evening.     . Cyanocobalamin (VITAMIN B 12) 500 MCG TABS Take 1,000 mcg by mouth daily. 180 tablet 1  . ferrous sulfate 325 (65 FE) MG tablet Take 1 tablet (325 mg total) by mouth daily with breakfast. 90 tablet 3  . insulin aspart protamine- aspart (NOVOLOG MIX 70/30) (70-30) 100 UNIT/ML injection 42 units in the morning and 50 units at night. 10 mL 0  . losartan (COZAAR) 100 MG tablet Take 100 mg by mouth daily.    . metFORMIN (GLUCOPHAGE) 1000 MG tablet Take 1,000 mg by mouth 2 (two) times daily with a meal.    . metoprolol tartrate 37.5 MG TABS Take 37.5 mg by mouth 2 (two) times daily. 60 tablet 2  . potassium chloride (KLOR-CON) 10 MEQ tablet Take 2 tablets (20 mEq total) by mouth daily. 90 tablet 3  . spironolactone (ALDACTONE) 25 MG tablet Take 25 mg by mouth daily.    . tamsulosin (FLOMAX) 0.4 MG CAPS capsule Take 2 capsules (0.8 mg total) by mouth every  evening. 60 capsule 3  . torsemide (DEMADEX) 20 MG tablet Take 1 tablet (20 mg total) by mouth daily. May take extra 20mg  tablet daily if weight gain of 3lbs or more noted in 24 hour period. 90 tablet 3  . isosorbide mononitrate (IMDUR) 30 MG 24 hr tablet Take 0.5 tablets (15 mg total) by mouth every evening. For a couple of weeks then increase to 30 mg 90 tablet 3   No current facility-administered medications for this visit.   Allergies:  Ivp dye [iodinated diagnostic agents]   ROS: No palpitations.  Physical Exam: VS:  BP (!) 160/58   Pulse 61   Ht 5\' 8"  (1.727 m)   Wt 264 lb (119.7 kg)   SpO2 96%   BMI 40.14 kg/m , BMI Body mass index is 40.14 kg/m.  Wt Readings from Last 3 Encounters:  12/30/20 264 lb (119.7 kg)  10/14/20 264 lb (119.7 kg)  09/02/20 258 lb (117 kg)    General: Patient appears comfortable at  rest. HEENT: Conjunctiva and lids normal, wearing a mask. Neck: Supple, no elevated JVP or carotid bruits, no thyromegaly. Lungs: Clear to auscultation, nonlabored breathing at rest. Cardiac: Regular rate and rhythm, no S3 or significant systolic murmur, no pericardial rub. Extremities: Mild ankle edema.  ECG:  An ECG dated 08/15/2020 was personally reviewed today and demonstrated:  Sinus rhythm with prolonged PR interval, leftward axis, decreased R wave progression, nonspecific T wave changes.  Recent Labwork: 05/01/2020: TSH 2.437 05/03/2020: Magnesium 2.0 08/15/2020: ALT 23; AST 21; BUN 30; Creatinine, Ser 1.72; Hemoglobin 11.3; Platelets 182; Potassium 4.2; Sodium 137     Component Value Date/Time   CHOL 128 08/16/2020 0513   TRIG 110 08/16/2020 0513   HDL 30 (L) 08/16/2020 0513   CHOLHDL 4.3 08/16/2020 0513   VLDL 22 08/16/2020 0513   LDLCALC 76 08/16/2020 0513   LDLCALC 98 03/07/2020 1158    Other Studies Reviewed Today:  Carotid Dopplers 08/16/2020: IMPRESSION: 1. Right carotid artery system: Less than 50% stenosis secondary to atherosclerotic plaque formation, not significantly progressed since 2014.  2. Left carotid artery system: Less than 50% stenosis secondary to atherosclerotic plaque formation, not significantly progressed since 2014.  3.  Vertebral artery system: Patent with antegrade flow bilaterally.  Echocardiogram 08/16/2020: 1. Left ventricular ejection fraction, by estimation, is 60 to 65%. The  left ventricle has normal function. The left ventricle has no regional  wall motion abnormalities. There is moderate left ventricular hypertrophy.  Left ventricular diastolic  parameters were normal.  2. Right ventricular systolic function is normal. The right ventricular  size is normal.  3. The mitral valve is grossly normal. No evidence of mitral valve  regurgitation. Moderate mitral annular calcification.  4. The aortic valve is tricuspid. There is  mild calcification of the  aortic valve. Aortic valve regurgitation is not visualized. No aortic  stenosis is present.   Assessment and Plan:  1.  Multivessel CAD status post CABG in 2007.  He reports no active angina but does have more prominent dyspnea on exertion.  LVEF was 60 to 65% by follow-up echocardiogram in October 2021.  Myoview from July of last year was overall low risk.  Plan to add Imdur to current regimen.  Continue Norvasc, Lipitor, Lopressor and losartan.  2.  Paroxysmal atrial fibrillation/flutter.  Heart rate regular and he reports no interval palpitations.  Continue Eliquis for stroke prophylaxis along with Lopressor.  CHA2DS2-VASc score is six.  3.  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  4.  History of stroke.  Medication Adjustments/Labs and Tests Ordered: Current medicines are reviewed at length with the patient today.  Concerns regarding medicines are outlined above.   Tests Ordered: No orders of the defined types were placed in this encounter.   Medication Changes: Meds ordered this encounter  Medications  . DISCONTD: isosorbide mononitrate (IMDUR) 30 MG 24 hr tablet    Sig: Take 0.5 tablets (15 mg total) by mouth every evening. For a couple of weeks then increase to 30 mg    Dispense:  90 tablet    Refill:  3    Disposition:  Follow up 6 months in the [place] office.  Signed, Satira Sark, MD, [facility] 12/30/2020 4:01 PM    [place] [facility] [place], [place], [place] [address] Phone: [phone]; Fax: [phone]

## 2020-12-30 NOTE — Patient Instructions (Addendum)
Medication Instructions:   Your physician has recommended you make the following change in your medication:   Start isosorbide mononitrate 15 mg daily in the evening for 2 weeks, then increase to 30 mg.  Continue other medications the same  Labwork:  none  Testing/Procedures:  none  Follow-Up:  Your physician recommends that you schedule a follow-up appointment in: 6 months at the [place] office.  Any Other Special Instructions Will Be Listed Below (If Applicable).  If you need a refill on your cardiac medications before your next appointment, please call your pharmacy.

## 2020-12-31 ENCOUNTER — Ambulatory Visit: Payer: Medicare Other | Admitting: Cardiology

## 2021-02-09 DIAGNOSIS — E1165 Type 2 diabetes mellitus with hyperglycemia: Secondary | ICD-10-CM | POA: Diagnosis not present

## 2021-02-09 DIAGNOSIS — I10 Essential (primary) hypertension: Secondary | ICD-10-CM | POA: Diagnosis not present

## 2021-02-09 DIAGNOSIS — Z299 Encounter for prophylactic measures, unspecified: Secondary | ICD-10-CM | POA: Diagnosis not present

## 2021-02-09 DIAGNOSIS — J449 Chronic obstructive pulmonary disease, unspecified: Secondary | ICD-10-CM | POA: Diagnosis not present

## 2021-02-16 DIAGNOSIS — H401133 Primary open-angle glaucoma, bilateral, severe stage: Secondary | ICD-10-CM | POA: Diagnosis not present

## 2021-04-30 DIAGNOSIS — E1165 Type 2 diabetes mellitus with hyperglycemia: Secondary | ICD-10-CM | POA: Diagnosis not present

## 2021-05-12 DIAGNOSIS — E1165 Type 2 diabetes mellitus with hyperglycemia: Secondary | ICD-10-CM | POA: Diagnosis not present

## 2021-05-12 DIAGNOSIS — I10 Essential (primary) hypertension: Secondary | ICD-10-CM | POA: Diagnosis not present

## 2021-05-12 DIAGNOSIS — I69359 Hemiplegia and hemiparesis following cerebral infarction affecting unspecified side: Secondary | ICD-10-CM | POA: Diagnosis not present

## 2021-05-12 DIAGNOSIS — N183 Chronic kidney disease, stage 3 unspecified: Secondary | ICD-10-CM | POA: Diagnosis not present

## 2021-05-12 DIAGNOSIS — E1122 Type 2 diabetes mellitus with diabetic chronic kidney disease: Secondary | ICD-10-CM | POA: Diagnosis not present

## 2021-05-12 DIAGNOSIS — Z299 Encounter for prophylactic measures, unspecified: Secondary | ICD-10-CM | POA: Diagnosis not present

## 2021-05-29 DIAGNOSIS — E1165 Type 2 diabetes mellitus with hyperglycemia: Secondary | ICD-10-CM | POA: Diagnosis not present

## 2021-05-31 DIAGNOSIS — E1165 Type 2 diabetes mellitus with hyperglycemia: Secondary | ICD-10-CM | POA: Diagnosis not present

## 2021-06-29 ENCOUNTER — Telehealth: Payer: Self-pay | Admitting: *Deleted

## 2021-06-29 NOTE — Telephone Encounter (Signed)
PT w/[facility] called to report 3 lb weight gain in 24 hours Weight today 267 lbs Weight yesterday 264 lbs Denies sob, dizziness, chest pain Reports swelling in legs that is worse than normal Medications reviewed BP 130/60 HR 60 R 18 O2 Sat 99% Advised to take extra 20 mg torsemide as prescribed Advised to keep upcoming visit on 07/17/21

## 2021-07-01 DIAGNOSIS — E1165 Type 2 diabetes mellitus with hyperglycemia: Secondary | ICD-10-CM | POA: Diagnosis not present

## 2021-07-16 NOTE — Progress Notes (Signed)
Cardiology Office Note  Date: 07/17/2021   ID: [name], DOB 07/26/1941, MRN [id number]  PCP:  Monico Blitz, MD  Cardiologist:  Rozann Lesches, MD Electrophysiologist:  None   Chief Complaint  Patient presents with   Cardiac follow-up    History of Present Illness: [name] is a 80 y.o. male last seen in March.  He is here with his wife for a follow-up visit.  Recently, he has had fluctuations in weight and leg swelling, doubled up on his Demadex for a few days last week after being seen by a [place] at home for check.  His weight is up 10 pounds compared to March by our scales today.  Imdur was added at the last visit.  Reports stable NYHA class II dyspnea, no obvious angina symptoms.  I reviewed the remainder of his medications which are stable from a cardiac perspective, although he is no longer on any potassium supplements.  He has not had any recent lab work.  His last echocardiogram was in October 2021 at which point LVEF was 60 to 65% with moderate LVH, normal RV contraction.  Past Medical History:  Diagnosis Date   Anemia    C. difficile diarrhea    CKD (chronic kidney disease), stage III ([place])    Coronary artery disease    Status post CABG in 2007 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 ([place]) 05/20/2011   GERD (gastroesophageal reflux disease)    Glaucoma    Hyperlipidemia    Hypertension    Morbid obesity ([place])    PAF (paroxysmal atrial fibrillation) ([place])    Peripheral vascular disease ([place])    Sleep apnea    Stroke ([place]) 2008, 2014, 2015    Past Surgical History:  Procedure Laterality Date   CHOLECYSTECTOMY     CORONARY ARTERY BYPASS GRAFT     NO PAST SURGERIES     THYROID SURGERY      Current Outpatient Medications  Medication Sig Dispense Refill   acetaminophen (TYLENOL) 650 MG CR tablet Take 1,300 mg by mouth every 8 (eight) hours as needed for pain.     Adalimumab 40 MG/0.8ML PNKT Inject  into the skin.     amLODipine (NORVASC) 10 MG tablet Take 10 mg by mouth daily.     apixaban (ELIQUIS) 5 MG TABS tablet Take 5 mg by mouth 2 (two) times daily.     atorvastatin (LIPITOR) 80 MG tablet Take 80 mg by mouth every evening.      Cyanocobalamin (VITAMIN B 12) 500 MCG TABS Take 1,000 mcg by mouth daily. 180 tablet 1   ferrous sulfate 325 (65 FE) MG tablet Take 1 tablet (325 mg total) by mouth daily with breakfast. 90 tablet 3   insulin aspart protamine- aspart (NOVOLOG MIX 70/30) (70-30) 100 UNIT/ML injection 42 units in the morning and 50 units at night. 10 mL 0   isosorbide mononitrate (IMDUR) 30 MG 24 hr tablet Take 0.5 tablets (15 mg total) by mouth every evening. For a couple of weeks then increase to 30 mg 90 tablet 3   losartan (COZAAR) 100 MG tablet Take 100 mg by mouth daily.     metFORMIN (GLUCOPHAGE) 1000 MG tablet Take 1,000 mg by mouth 2 (two) times daily with a meal.     metoprolol tartrate (LOPRESSOR) 50 MG tablet Take 25 mg by mouth 2 (two) times daily.     spironolactone (ALDACTONE) 25 MG  tablet Take 25 mg by mouth daily.     tamsulosin (FLOMAX) 0.4 MG CAPS capsule Take 2 capsules (0.8 mg total) by mouth every evening. 60 capsule 3   torsemide (DEMADEX) 20 MG tablet Take 20 mg by mouth daily. May take an extra 20 mg daily for weight gain of 3 lbs/day or 5 lbs/week     No current facility-administered medications for this visit.   Allergies:  Ivp dye [iodinated diagnostic agents]   ROS: No palpitations or syncope.  Using a walker.  Physical Exam: VS:  BP 126/66   Pulse 65   Ht 5\' 8"  (1.727 m)   Wt 274 lb 12.8 oz (124.6 kg)   SpO2 99%   BMI 41.78 kg/m , BMI Body mass index is 41.78 kg/m.  Wt Readings from Last 3 Encounters:  07/17/21 274 lb 12.8 oz (124.6 kg)  12/30/20 264 lb (119.7 kg)  10/14/20 264 lb (119.7 kg)    General: Patient appears comfortable at rest. HEENT: Conjunctiva and lids normal, wearing a mask. Neck: Supple, no elevated JVP or carotid  bruits, no thyromegaly. Lungs: Clear to auscultation, nonlabored breathing at rest. Cardiac: Regular rate and rhythm, no S3 or significant systolic murmur, no pericardial rub. Extremities: Mild lower leg edema, left greater than right, wearing compression stockings..  ECG:  An ECG dated 08/15/2020 was personally reviewed today and demonstrated:  Sinus rhythm with prolonged PR interval, leftward axis, decreased R wave progression, nonspecific T wave changes.  Recent Labwork: 08/15/2020: ALT 23; AST 21; BUN 30; Creatinine, Ser 1.72; Hemoglobin 11.3; Platelets 182; Potassium 4.2; Sodium 137     Component Value Date/Time   CHOL 128 08/16/2020 0513   TRIG 110 08/16/2020 0513   HDL 30 (L) 08/16/2020 0513   CHOLHDL 4.3 08/16/2020 0513   VLDL 22 08/16/2020 0513   LDLCALC 76 08/16/2020 0513   LDLCALC 98 03/07/2020 1158  November 2021: Cholesterol 129, triglycerides 132, HDL 40, LDL 63, hemoglobin A1c 8%, hemoglobin 11.3, platelets 201, potassium 4.3, BUN 33, creatinine 1.74, AST 28, ALT 60  Other Studies Reviewed Today:  Echocardiogram 08/16/2020:  1. Left ventricular ejection fraction, by estimation, is 60 to 65%. The  left ventricle has normal function. The left ventricle has no regional  wall motion abnormalities. There is moderate left ventricular hypertrophy.  Left ventricular diastolic  parameters were normal.   2. Right ventricular systolic function is normal. The right ventricular  size is normal.   3. The mitral valve is grossly normal. No evidence of mitral valve  regurgitation. Moderate mitral annular calcification.   4. The aortic valve is tricuspid. There is mild calcification of the  aortic valve. Aortic valve regurgitation is not visualized. No aortic  stenosis is present.   Assessment and Plan:  1.  Weight gain and leg swelling, acute on chronic diastolic heart failure suspected.  Currently on Demadex 20 mg daily and Aldactone 25 mg daily.  No potassium supplement at this  point.  Check BMET with plan to most likely [facility] diuretics.  2.  CKD stage IIIb, last creatinine 1.74.  3.  Paroxysmal atrial fibrillation/flutter.  No active palpitations and heart rate regular today.  CHA2DS2-VASc score is 6.  Continue Eliquis for stroke prophylaxis.  He is on Lopressor as well.  4.  Multivessel CAD status post CABG in 2007.  No active angina at this time.  Continue Norvasc, Lipitor, Lopressor, losartan, and Imdur.  Follow-up echocardiogram to ensure stable LVEF.  Medication Adjustments/Labs and Tests Ordered: Current medicines  are reviewed at length with the patient today.  Concerns regarding medicines are outlined above.   Tests Ordered: Orders Placed This Encounter  Procedures   Basic metabolic panel   ECHOCARDIOGRAM COMPLETE     Medication Changes: No orders of the defined types were placed in this encounter.   Disposition:  Follow up  6 months.  Signed, Satira Sark, MD, [facility] 07/17/2021 1:25 PM    [facility] at [facility] [address], [place], [place] [address] Phone: [phone]; Fax: [phone]

## 2021-07-17 ENCOUNTER — Other Ambulatory Visit: Payer: Self-pay

## 2021-07-17 ENCOUNTER — Telehealth: Payer: Self-pay

## 2021-07-17 ENCOUNTER — Encounter: Payer: Self-pay | Admitting: Cardiology

## 2021-07-17 ENCOUNTER — Ambulatory Visit (INDEPENDENT_AMBULATORY_CARE_PROVIDER_SITE_OTHER): Payer: Medicare Other | Admitting: Cardiology

## 2021-07-17 ENCOUNTER — Other Ambulatory Visit (HOSPITAL_COMMUNITY)
Admission: RE | Admit: 2021-07-17 | Discharge: 2021-07-17 | Disposition: A | Discharge status: Home / Self Care | Payer: Medicare Other | Source: Ambulatory Visit | Attending: Cardiology | Admitting: Cardiology

## 2021-07-17 VITALS — BP 126/66 | HR 65 | Ht 68.0 in | Wt 274.8 lb

## 2021-07-17 DIAGNOSIS — Z79899 Other long term (current) drug therapy: Secondary | ICD-10-CM | POA: Insufficient documentation

## 2021-07-17 DIAGNOSIS — I4891 Unspecified atrial fibrillation: Secondary | ICD-10-CM | POA: Diagnosis not present

## 2021-07-17 DIAGNOSIS — I5033 Acute on chronic diastolic (congestive) heart failure: Secondary | ICD-10-CM

## 2021-07-17 DIAGNOSIS — N1832 Chronic kidney disease, stage 3b: Secondary | ICD-10-CM

## 2021-07-17 DIAGNOSIS — I25119 Atherosclerotic heart disease of native coronary artery with unspecified angina pectoris: Secondary | ICD-10-CM

## 2021-07-17 LAB — BASIC METABOLIC PANEL
Anion gap: 3 — ABNORMAL LOW (ref 5–15)
BUN: 39 mg/dL — ABNORMAL HIGH (ref 8–23)
CO2: 28 mmol/L (ref 22–32)
Calcium: 8.6 mg/dL — ABNORMAL LOW (ref 8.9–10.3)
Chloride: 108 mmol/L (ref 98–111)
Creatinine, Ser: 2.14 mg/dL — ABNORMAL HIGH (ref 0.61–1.24)
GFR, Estimated: 31 mL/min — ABNORMAL LOW (ref >60)
Glucose, Bld: 109 mg/dL — ABNORMAL HIGH (ref 70–99)
Potassium: 5 mmol/L (ref 3.5–5.1)
Sodium: 139 mmol/L (ref 135–145)

## 2021-07-17 MED ORDER — TORSEMIDE 20 MG PO TABS: ORAL_TABLET | ORAL | 3 refills | Status: DC

## 2021-07-17 NOTE — Telephone Encounter (Signed)
I spoke with wife and she verbalized understanding that they will increase Mr.Farmers torsemide to 20 mg daily alternating with 40 mg the next.Repeat bmet in 2 weeks at [facility].PCP copied

## 2021-07-17 NOTE — Patient Instructions (Signed)
Medication Instructions:  ?Your physician recommends that you continue on your current medications as directed. Please refer to the Current Medication list given to you today. ? ?*If you need a refill on your cardiac medications before your next appointment, please call your pharmacy* ? ? ?Lab Work: ?Your physician recommends that you return for lab work in: Today ( BMET)  ? ?If you have labs (blood work) drawn today and your tests are completely normal, you will receive your results only by: ?MyChart Message (if you have MyChart) OR ?A paper copy in the mail ?If you have any lab test that is abnormal or we need to change your treatment, we will call you to review the results. ? ? ?Testing/Procedures: ?NONE  ? ? ?Follow-Up: ?At [facility], you and your health needs are our priority.  As part of our continuing mission to provide you with exceptional heart care, we have created designated Provider Care Teams.  These Care Teams include your primary Cardiologist (physician) and Advanced Practice Providers (APPs -  Physician Assistants and Nurse Practitioners) who all work together to provide you with the care you need, when you need it. ? ?We recommend signing up for the patient portal called "MyChart".  Sign up information is provided on this After Visit Summary.  MyChart is used to connect with patients for Virtual Visits (Telemedicine).  Patients are able to view lab/test results, encounter notes, upcoming appointments, etc.  Non-urgent messages can be sent to your provider as well.   ?To learn more about what you can do with MyChart, go to [email or web address].   ? ?Your next appointment:   ?6 month(s) ? ?The format for your next appointment:   ?In Person ? ?Provider:   ?Samuel McDowell, MD  ? ? ?Other Instructions ?Thank you for choosing [facility]! ? ? ? ?

## 2021-07-17 NOTE — Telephone Encounter (Signed)
-----   Message from Satira Sark, MD sent at 07/17/2021  2:41 PM EDT ----- Results reviewed.  Creatinine is 2.14, previously 1.72, but has been in similar range in the past.  His potassium is actually high normal at 5.0 so he does not need a supplement.  In terms of his weight gain and chronic diastolic heart failure, I would suggest that we change his Demadex to 20 mg one day alternating with 40 mg the next.  He should have a follow-up BMET in 2 weeks.

## 2021-07-20 DIAGNOSIS — H401133 Primary open-angle glaucoma, bilateral, severe stage: Secondary | ICD-10-CM | POA: Diagnosis not present

## 2021-07-31 DIAGNOSIS — E1165 Type 2 diabetes mellitus with hyperglycemia: Secondary | ICD-10-CM | POA: Diagnosis not present

## 2021-08-11 ENCOUNTER — Ambulatory Visit (HOSPITAL_COMMUNITY): Admission: RE | Admit: 2021-08-11 | Payer: Medicare Other | Source: Ambulatory Visit

## 2021-08-26 DIAGNOSIS — Z23 Encounter for immunization: Secondary | ICD-10-CM | POA: Diagnosis not present

## 2021-08-26 DIAGNOSIS — Z7189 Other specified counseling: Secondary | ICD-10-CM | POA: Diagnosis not present

## 2021-08-26 DIAGNOSIS — Z0000 Encounter for general adult medical examination without abnormal findings: Secondary | ICD-10-CM | POA: Diagnosis not present

## 2021-08-26 DIAGNOSIS — E7800 Pure hypercholesterolemia, unspecified: Secondary | ICD-10-CM | POA: Diagnosis not present

## 2021-08-26 DIAGNOSIS — Z299 Encounter for prophylactic measures, unspecified: Secondary | ICD-10-CM | POA: Diagnosis not present

## 2021-08-26 DIAGNOSIS — R5383 Other fatigue: Secondary | ICD-10-CM | POA: Diagnosis not present

## 2021-08-26 DIAGNOSIS — I10 Essential (primary) hypertension: Secondary | ICD-10-CM | POA: Diagnosis not present

## 2021-08-26 DIAGNOSIS — E1165 Type 2 diabetes mellitus with hyperglycemia: Secondary | ICD-10-CM | POA: Diagnosis not present

## 2021-08-31 DIAGNOSIS — E1165 Type 2 diabetes mellitus with hyperglycemia: Secondary | ICD-10-CM | POA: Diagnosis not present

## 2021-09-30 DIAGNOSIS — E1165 Type 2 diabetes mellitus with hyperglycemia: Secondary | ICD-10-CM | POA: Diagnosis not present

## 2021-10-30 DIAGNOSIS — E1165 Type 2 diabetes mellitus with hyperglycemia: Secondary | ICD-10-CM | POA: Diagnosis not present

## 2021-11-22 IMAGING — CT CT HEAD W/O CM
3 series · 15 of 47 positions shown, 18 images · non-contrast
Comparison: 11/20/2013

CLINICAL DATA: Dizziness

EXAM:
CT HEAD WITHOUT CONTRAST
TECHNIQUE: Contiguous axial images were obtained from the base of the skull
through the vertex without intravenous contrast.

[Series 3: head w o · axial · 0.54mm/px · z∈[+7,+142]mm · 9 of 33 slices shown, 12 images]
[im 3/33  brain]
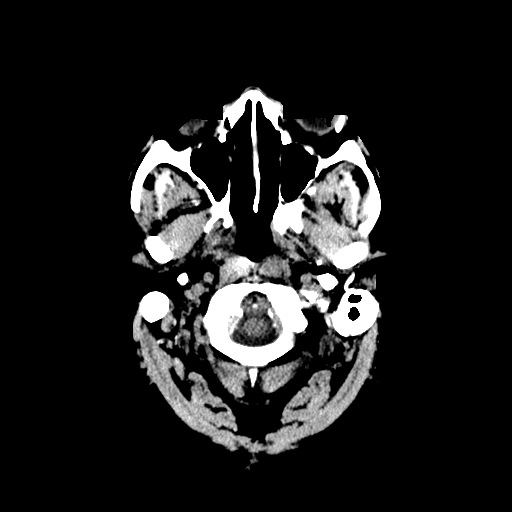
[im 3/33  bone]
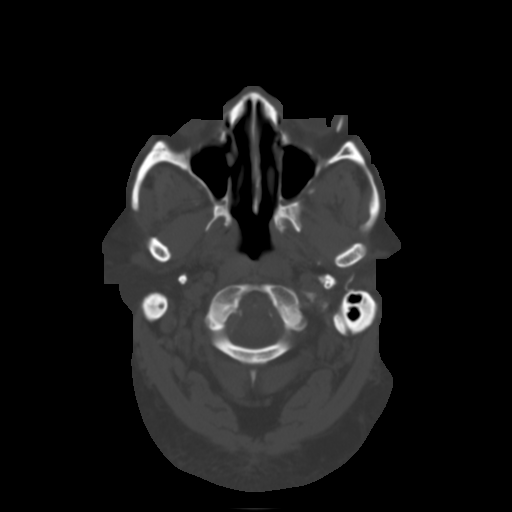
[im 6/33  brain]
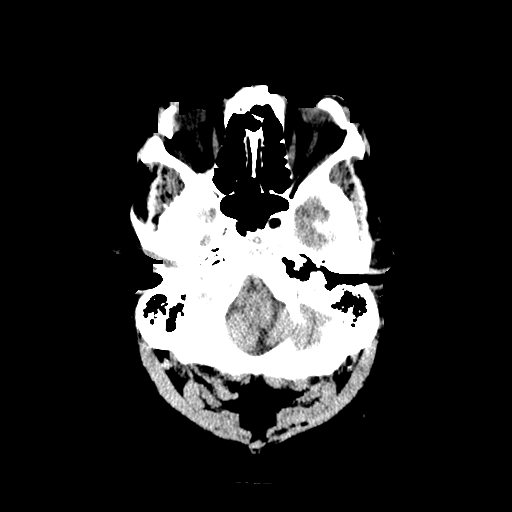
[im 9/33  brain]
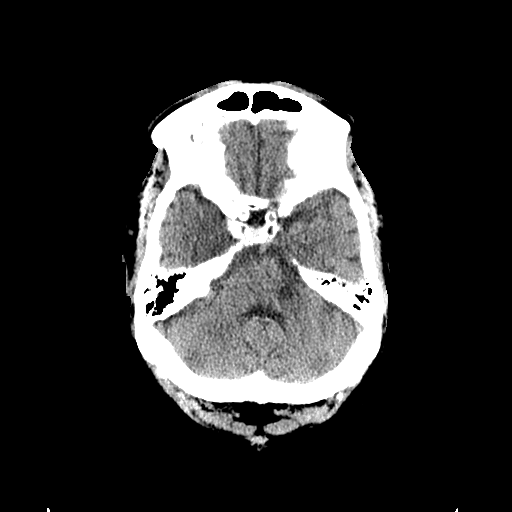
[im 13/33  brain]
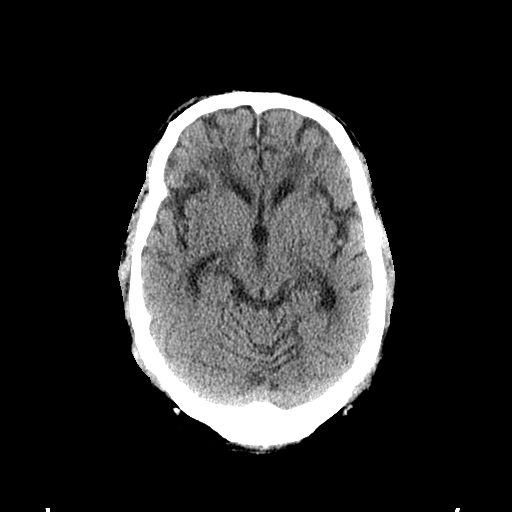
[im 17/33  brain]
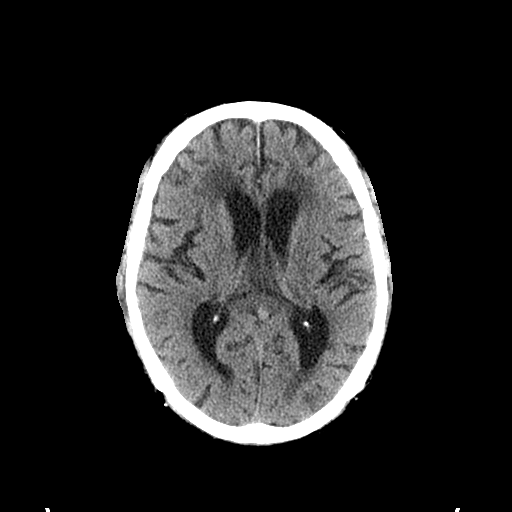
[im 17/33  bone]
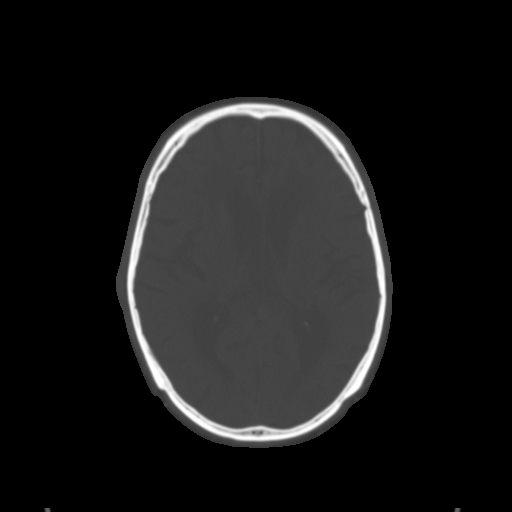
[im 20/33  brain]
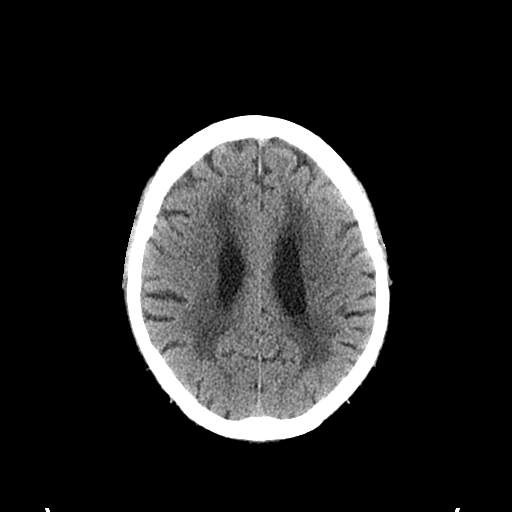
[im 24/33  brain]
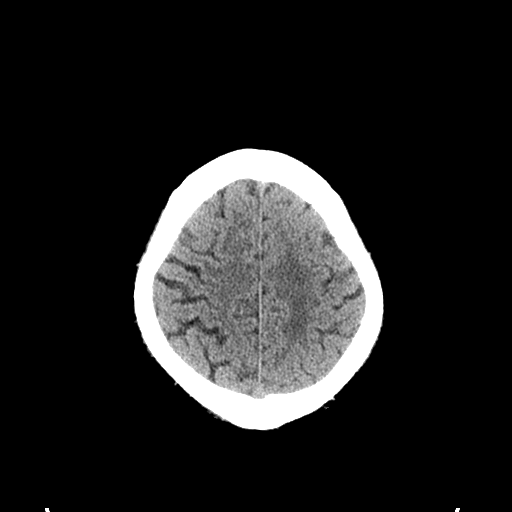
[im 27/33  brain]
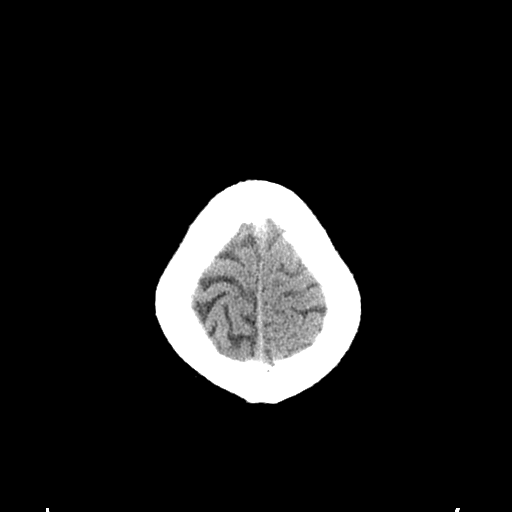
[im 30/33  brain]
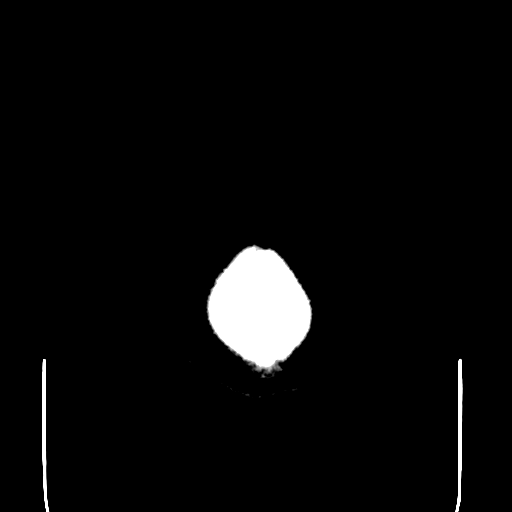
[im 30/33  bone]
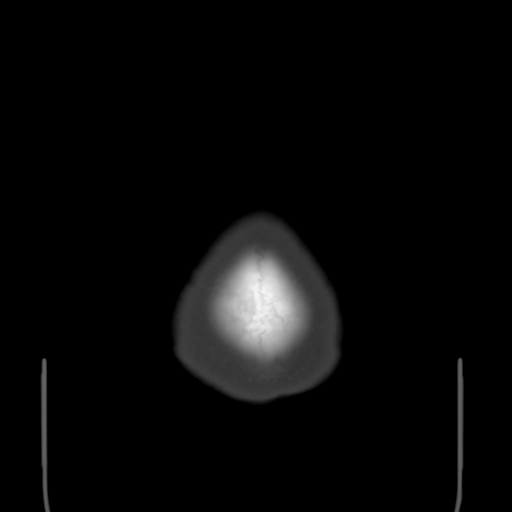

[Series 5: coronal soft · coronal · 0.33mm/px · 3 of 75 slices shown]
[im 25/75  brain]
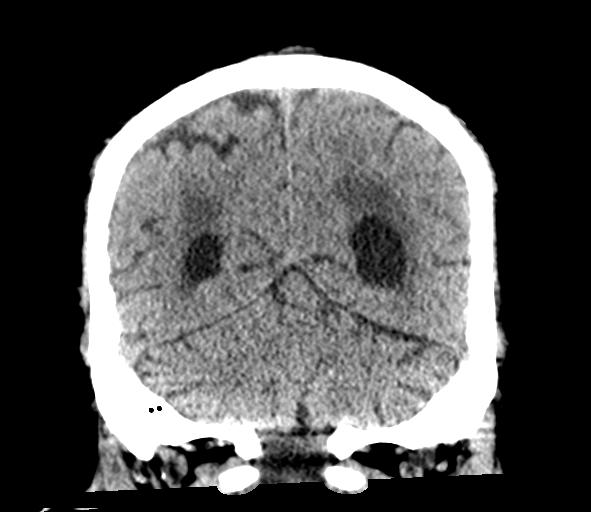
[im 33/75  brain]
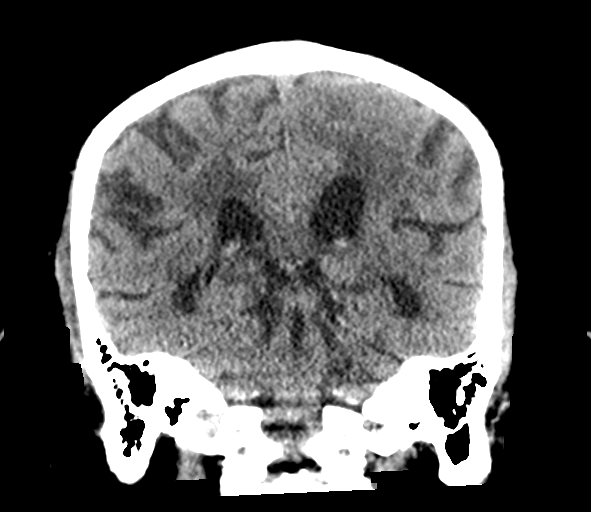
[im 42/75  brain]
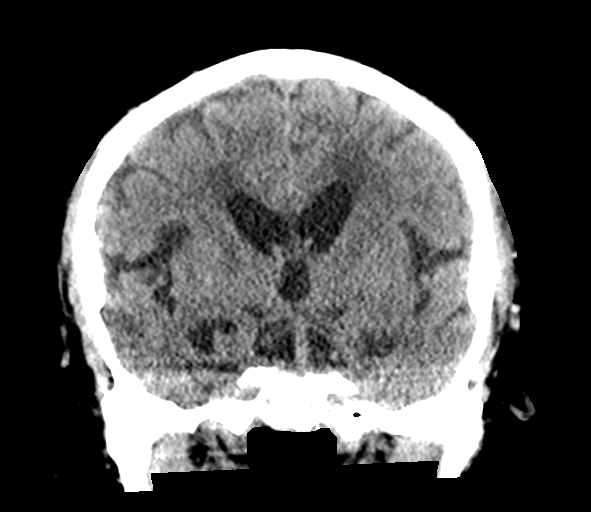

[Series 6: sagittal soft · sagittal · 0.36mm/px · 3 of 65 slices shown]
[im 22/65  brain]
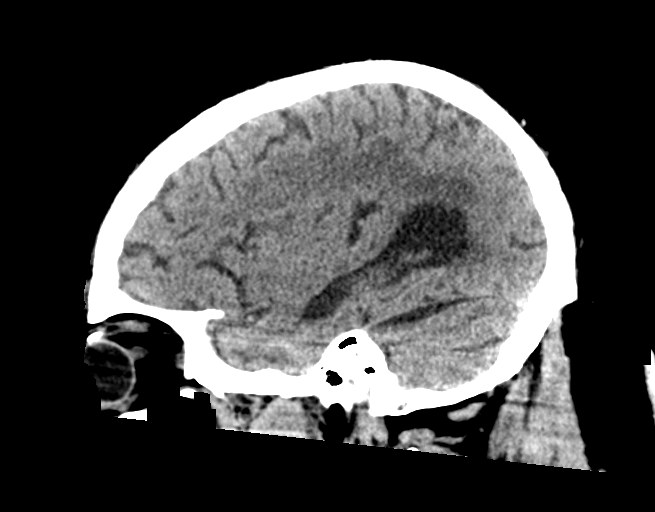
[im 33/65  brain]
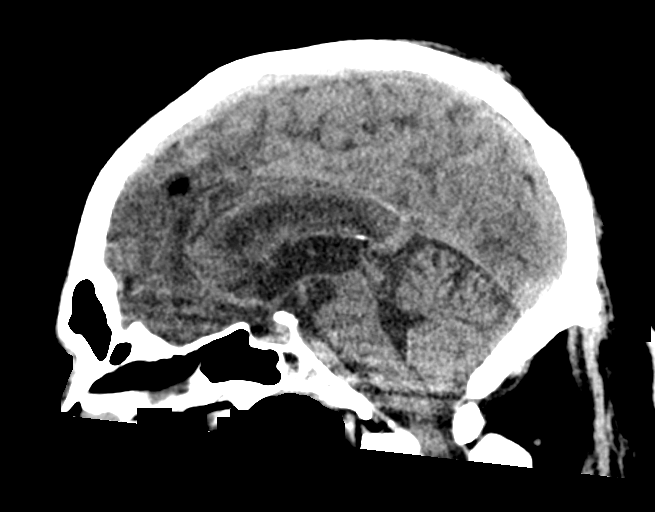
[im 43/65  brain]
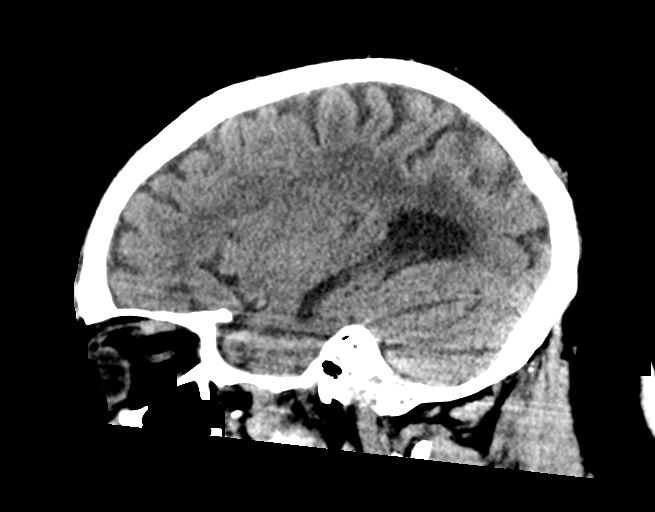

[15 of 47 positions shown; findings below may reference images not displayed]

FINDINGS: Brain: No evidence of acute infarction, hemorrhage, hydrocephalus,
extra-axial collection or mass lesion/mass effect. Remote lacunar
infarcts in the bilateral basal ganglia. Moderate low-density
changes within the periventricular and subcortical white matter
compatible with chronic microvascular ischemic change. Mild diffuse
cerebral volume loss.

Vascular: Atherosclerotic calcifications involving the large vessels
of the skull base. No unexpected hyperdense vessel.

Skull: Normal. Negative for fracture or focal lesion.

Sinuses/Orbits: No acute finding.

Other: None.
IMPRESSION: 1. No acute intracranial findings.
2. Chronic microvascular ischemic change and cerebral volume loss.

## 2021-11-22 IMAGING — MR MR HEAD W/O CM
13 of 14 series · 43 of 48 positions shown · non-contrast
Comparison: 3710

CLINICAL DATA: Stroke, follow-up

EXAM:
MRI HEAD WITHOUT CONTRAST
TECHNIQUE: Multiplanar, multiecho pulse sequences of the brain and surrounding
structures were obtained without intravenous contrast.

[Series 5: DWI · axial · 3.0mm · 0.77mm/px · z∈[-92,+55]mm · 3 of 50 slices shown (1 of 6)]
[im 1/50]
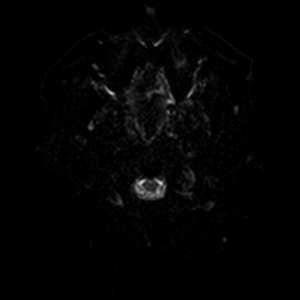
[im 25/50]
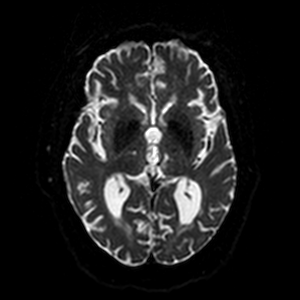
[im 50/50]
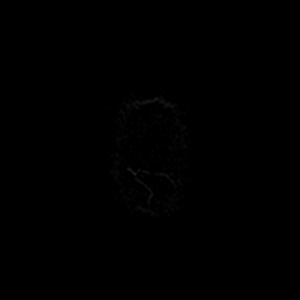

[Series 5: DWI · axial · 3.0mm · 0.77mm/px · z∈[-92,+55]mm · 3 of 50 slices shown (2 of 6)]
[im 1/50]
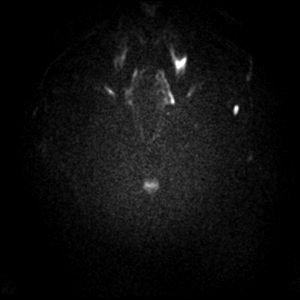
[im 25/50]
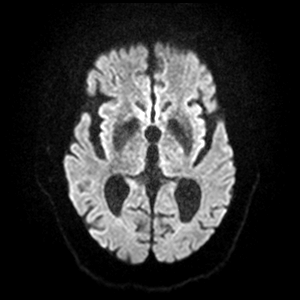
[im 50/50]
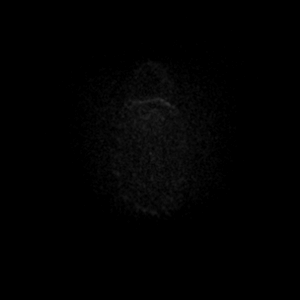

[Series 6: DWI · axial · 3.0mm · 0.77mm/px · z∈[-92,+55]mm · 3 of 50 slices shown (3 of 6)]
[im 1/50]
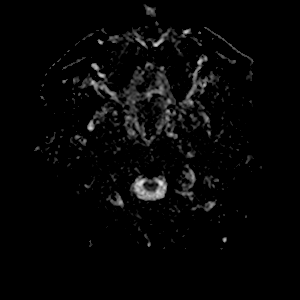
[im 25/50]
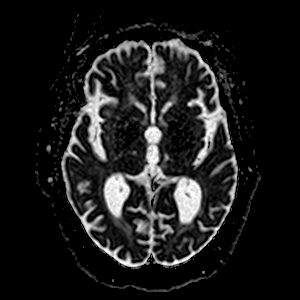
[im 50/50]
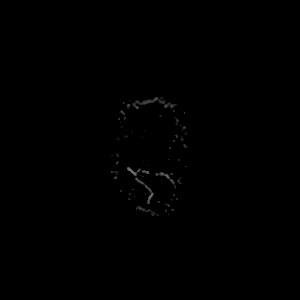

[Series 7: DWI · coronal · 5.0mm · 0.88mm/px · 3 of 32 slices shown (4 of 6)]
[im 1/32]
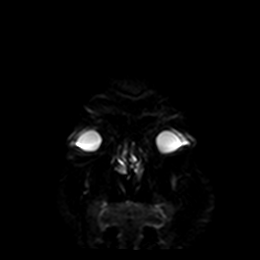
[im 16/32]
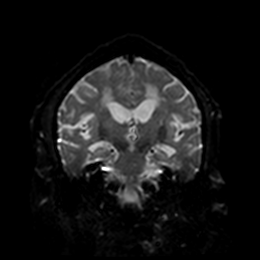
[im 32/32]
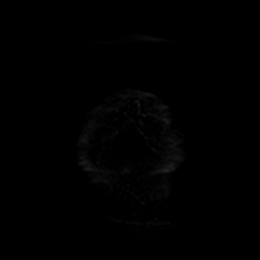

[Series 7: DWI · coronal · 5.0mm · 0.88mm/px · 3 of 32 slices shown (5 of 6)]
[im 1/32]
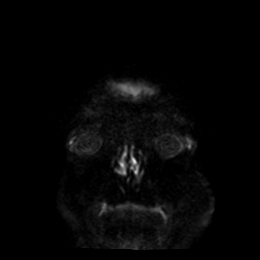
[im 16/32]
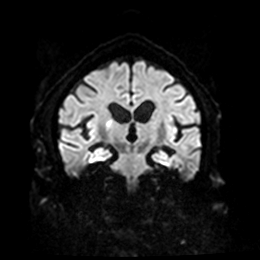
[im 32/32]
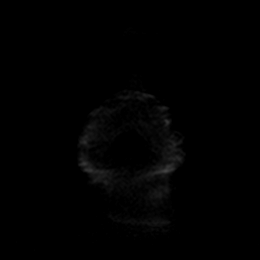

[Series 8: DWI · coronal · 5.0mm · 0.88mm/px · 3 of 32 slices shown (6 of 6)]
[im 1/32]
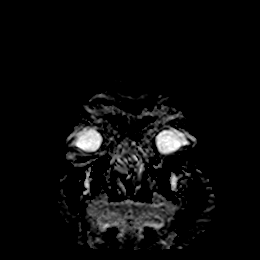
[im 16/32]
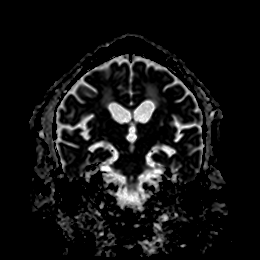
[im 32/32]
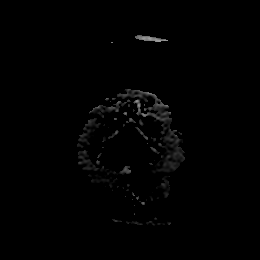

[Series 9: T1 · sagittal · 5.0mm · 0.78mm/px · 2 of 21 slices shown]
[im 1/21]
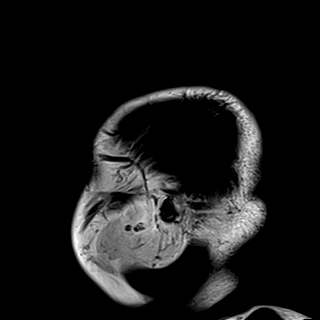
[im 21/21]
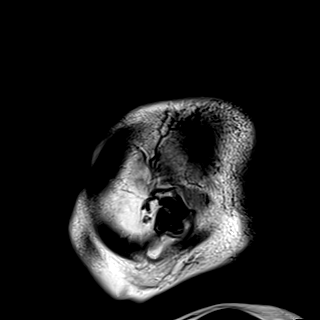

[Series 15: T2 · axial · 5.0mm · 0.72mm/px · z∈[-92,+55]mm · 2 of 22 slices shown (1 of 2)]
[im 1/22]
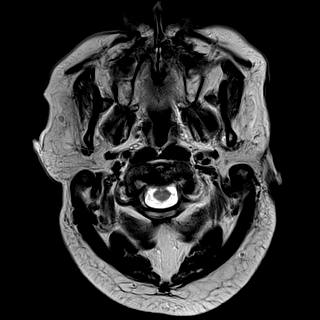
[im 22/22]
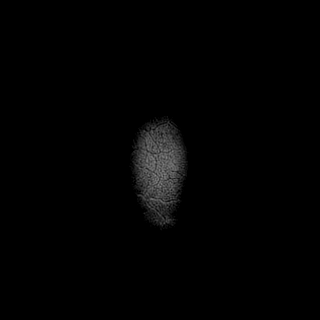

[Series 16: mag_images · axial · 3.0mm · 0.90mm/px · z∈[-98,+67]mm · 5 of 56 slices shown]
[im 1/56]
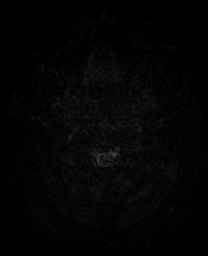
[im 14/56]
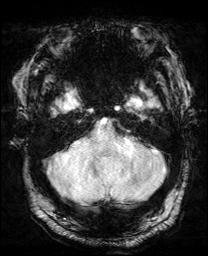
[im 28/56]
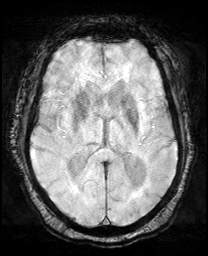
[im 42/56]
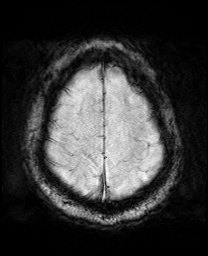
[im 56/56]
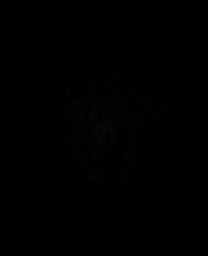

[Series 17: pha_images · axial · 3.0mm · 0.90mm/px · z∈[-98,+64]mm · 5 of 54 slices shown]
[im 1/54]
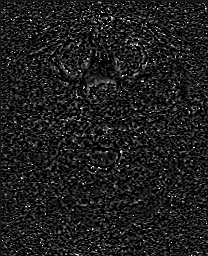
[im 14/54]
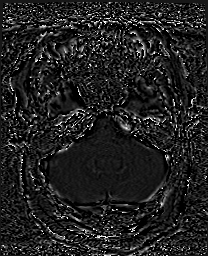
[im 27/54]
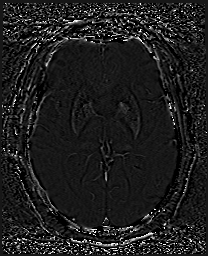
[im 40/54]
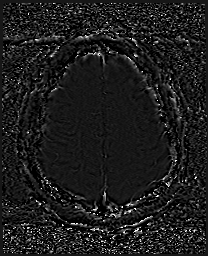
[im 54/54]
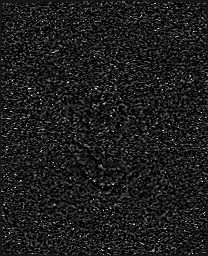

[Series 18: swi_images · axial · 3.0mm · 0.90mm/px · z∈[-98,+67]mm · 5 of 56 slices shown]
[im 1/56]
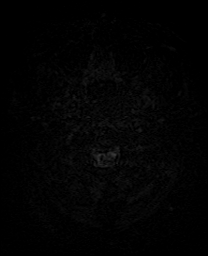
[im 14/56]
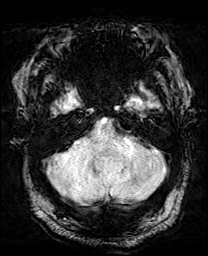
[im 28/56]
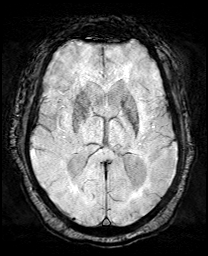
[im 42/56]
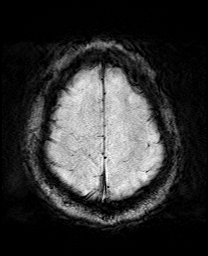
[im 56/56]
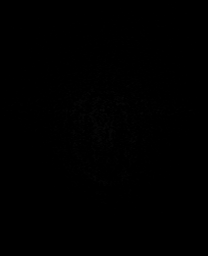

[Series 20: FLAIR · axial · 4.0mm · 0.43mm/px · z∈[-81,+50]mm · 3 of 34 slices shown]
[im 1/34]
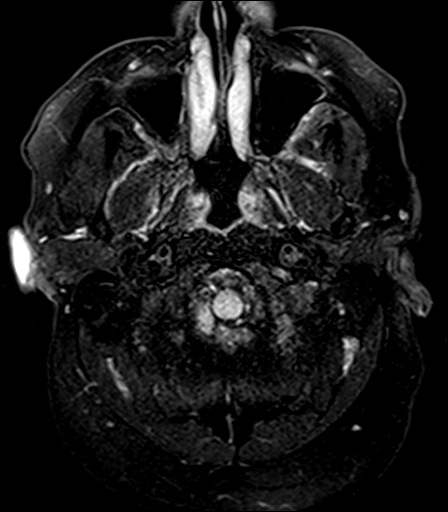
[im 17/34]
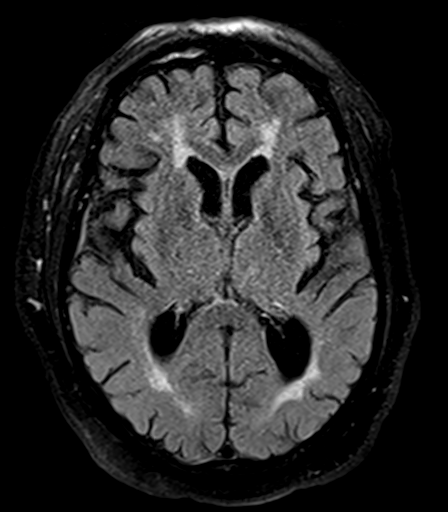
[im 34/34]
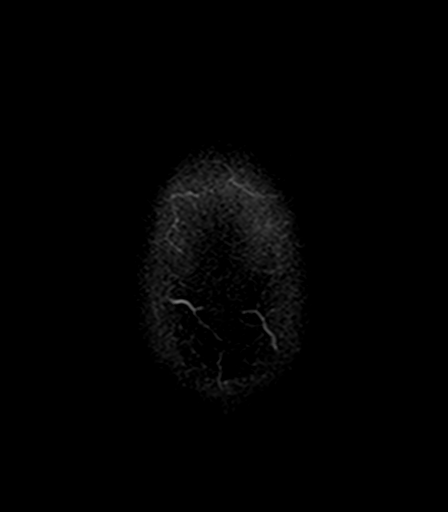

[Series 22: T2 · coronal · 5.0mm · 0.72mm/px · 3 of 30 slices shown (2 of 2)]
[im 1/30]
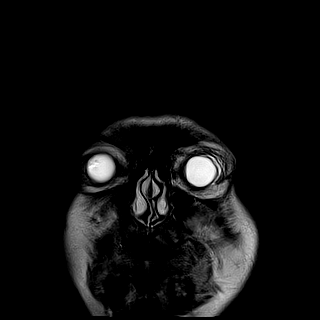
[im 15/30]
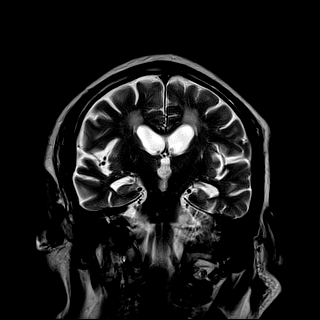
[im 30/30]
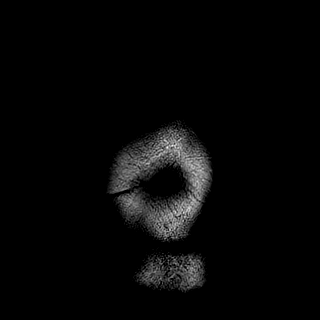

[43 of 48 positions shown; findings below may reference images not displayed]

FINDINGS: Brain: Two small foci of mildly reduced diffusion in the corona
radiata bilaterally. No evidence of hemorrhage.

Prominence of the ventricles and sulci reflects generalized
parenchymal volume loss. Patchy and confluent areas of T2
hyperintensity in the supratentorial and pontine white matter are
nonspecific but probably reflect moderate to marked chronic
microvascular ischemic changes. These findings have progressed since
3710. Multiple chronic infarcts are again identified including
involvement of the left brachium pontis, thalami, and central white
matter.

There is no hydrocephalus or extra-axial fluid collection.

Vascular: Major vessel flow voids at the skull base are preserved.

Skull and upper cervical spine: Normal marrow signal is preserved.

Sinuses/Orbits: Minor mucosal thickening. Bilateral lens
replacements.

Other: Sella is unremarkable.  Mastoid air cells are clear.
IMPRESSION: Two small acute to subacute infarcts involving the corona radiata
bilaterally.

Progression of chronic microvascular ischemic changes and
parenchymal volume loss since 3710. Chronic small vessel infarcts.

## 2021-11-29 DIAGNOSIS — E1165 Type 2 diabetes mellitus with hyperglycemia: Secondary | ICD-10-CM | POA: Diagnosis not present

## 2021-12-29 DIAGNOSIS — E1165 Type 2 diabetes mellitus with hyperglycemia: Secondary | ICD-10-CM | POA: Diagnosis not present

## 2022-01-08 ENCOUNTER — Telehealth: Payer: Self-pay | Admitting: Cardiology

## 2022-01-08 ENCOUNTER — Ambulatory Visit (HOSPITAL_COMMUNITY)
Admission: RE | Admit: 2022-01-08 | Discharge: 2022-01-08 | Disposition: A | Discharge status: Home / Self Care | Payer: Medicare Other | Source: Ambulatory Visit | Attending: Cardiology | Admitting: Cardiology

## 2022-01-08 DIAGNOSIS — I4891 Unspecified atrial fibrillation: Secondary | ICD-10-CM | POA: Insufficient documentation

## 2022-01-08 LAB — ECHOCARDIOGRAM COMPLETE
AR max vel: 1.57 cm2
AV Area VTI: 1.74 cm2
AV Area mean vel: 1.63 cm2
AV Mean grad: 2 mmHg
AV Peak grad: 5.2 mmHg
Ao pk vel: 1.14 m/s
Area-P 1/2: 2.19 cm2
MV VTI: 0.97 cm2
S' Lateral: 3.5 cm

## 2022-01-08 MED ORDER — PERFLUTREN LIPID MICROSPHERE
1.0000 mL | INTRAVENOUS | Status: AC | PRN
  Administered 2022-01-08: 3 mL via INTRAVENOUS
  Filled 2022-01-08: qty 10

## 2022-01-08 MED ORDER — ISOSORBIDE MONONITRATE ER 30 MG PO TB24: 30.0000 mg | ORAL_TABLET | Freq: Every evening | ORAL | 3 refills | Status: DC

## 2022-01-08 NOTE — Progress Notes (Signed)
*  PRELIMINARY RESULTS* ?Echocardiogram ?2D Echocardiogram has been performed. ? ?[name] ?01/08/2022, 4:15 PM ?

## 2022-01-08 NOTE — Telephone Encounter (Signed)
Refilled imdur 30 mg qd to [facility] ?

## 2022-01-08 NOTE — Telephone Encounter (Signed)
Patient walked in with a medication bottle for Isosorbide mono er '30Mg'$ . He needs a refill for this prescription. In the past it was sent to [facility] patient now wants it sent to the [place] in [place].  ?

## 2022-01-11 ENCOUNTER — Other Ambulatory Visit: Payer: Self-pay | Admitting: Cardiology

## 2022-01-13 ENCOUNTER — Encounter: Payer: Self-pay | Admitting: Cardiology

## 2022-01-13 ENCOUNTER — Ambulatory Visit (INDEPENDENT_AMBULATORY_CARE_PROVIDER_SITE_OTHER): Payer: Medicare Other | Admitting: Cardiology

## 2022-01-13 VITALS — BP 138/62 | HR 72 | Ht 68.0 in | Wt 281.4 lb

## 2022-01-13 DIAGNOSIS — N1832 Chronic kidney disease, stage 3b: Secondary | ICD-10-CM | POA: Diagnosis not present

## 2022-01-13 DIAGNOSIS — I4891 Unspecified atrial fibrillation: Secondary | ICD-10-CM

## 2022-01-13 DIAGNOSIS — I5032 Chronic diastolic (congestive) heart failure: Secondary | ICD-10-CM | POA: Diagnosis not present

## 2022-01-13 DIAGNOSIS — I480 Paroxysmal atrial fibrillation: Secondary | ICD-10-CM

## 2022-01-13 NOTE — Progress Notes (Signed)
? ? ?Cardiology Office Note ? ?Date: 01/13/2022  ? ?ID: [name], DOB [birth year], MRN [id number] ? ?PCP:  Monico Blitz, MD  ?Cardiologist:  Rozann Lesches, MD ?Electrophysiologist:  None  ? ?Chief Complaint  ?Patient presents with  ? Cardiac follow-up  ? ? ?History of Present Illness: ?[name] is a 81 y.o. male last seen in September 2022.  He is here with his wife for a follow-up visit.  He does not describe any angina symptoms or palpitations on current regimen.  He is using a walker, fairly sedentary at baseline.  We did talk about possibly considering outpatient PT to help with his balance and stamina. ? ?Recent follow-up echocardiogram revealed LVEF 55 to 60%, normal estimated RVSP, trivial mitral regurgitation, and sclerotic aortic valve without stenosis. ? ?I reviewed his medications which are outlined below.  He had lab work back in December 2022 and continue to follow to the [facility] system.  I personally reviewed his ECG today which shows sinus rhythm with leftward axis. ? ?Past Medical History:  ?Diagnosis Date  ? Anemia   ? C. difficile diarrhea   ? CKD (chronic kidney disease), stage III ([place])   ? Coronary artery disease   ? Status post CABG in 2007 [facility])  ? 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 GERD (gastroesophageal reflux disease)   ? Glaucoma   ? Hyperlipidemia   ? Hypertension   ? Morbid obesity ([place])   ? PAF (paroxysmal atrial fibrillation) ([place])   ? Peripheral vascular disease ([place])   ? Sleep apnea   ? Stroke [facility]) 2008, 2014, 2015  ? ? ?Past Surgical History:  ?Procedure Laterality Date  ? CHOLECYSTECTOMY    ? CORONARY ARTERY BYPASS GRAFT    ? NO PAST SURGERIES    ? THYROID SURGERY    ? ? ?Current Outpatient Medications  ?Medication Sig Dispense Refill  ? acetaminophen (TYLENOL) 650 MG CR tablet Take 1,300 mg by mouth every 8 (eight) hours as needed for pain.    ? Adalimumab 40 MG/0.8ML PNKT Inject into the skin.    ?  amLODipine (NORVASC) 10 MG tablet Take 10 mg by mouth daily.    ? apixaban (ELIQUIS) 5 MG TABS tablet Take 5 mg by mouth 2 (two) times daily.    ? atorvastatin (LIPITOR) 80 MG tablet Take 80 mg by mouth every evening.     ? Cyanocobalamin (VITAMIN B 12) 500 MCG TABS Take 1,000 mcg by mouth daily. 180 tablet 1  ? ferrous sulfate 325 (65 FE) MG tablet Take 1 tablet (325 mg total) by mouth daily with breakfast. 90 tablet 3  ? insulin aspart protamine- aspart (NOVOLOG MIX 70/30) (70-30) 100 UNIT/ML injection 42 units in the morning and 50 units at night. 10 mL 0  ? isosorbide mononitrate (IMDUR) 30 MG 24 hr tablet Take 1 tablet (30 mg total) by mouth every evening. 90 tablet 3  ? losartan (COZAAR) 100 MG tablet Take 100 mg by mouth daily.    ? metFORMIN (GLUCOPHAGE) 1000 MG tablet Take 1,000 mg by mouth 2 (two) times daily with a meal.    ? metoprolol tartrate (LOPRESSOR) 50 MG tablet Take 25 mg by mouth 2 (two) times daily.    ? spironolactone (ALDACTONE) 25 MG tablet Take 25 mg by mouth daily.    ? tamsulosin (FLOMAX) 0.4 MG CAPS capsule Take 2 capsules (0.8 mg total) by mouth every evening. 60 capsule  3  ? torsemide (DEMADEX) 20 MG tablet Take 20 mg daily alternating with 40 mg the next day 135 tablet 3  ? ?No current facility-administered medications for this visit.  ? ?Allergies:  Ivp dye [iodinated contrast media]  ? ?ROS: Weight fluctuates by 1 or 2 pounds.  No orthopnea or PND. ? ?Physical Exam: ?VS:  BP 138/62   Pulse 72   Ht '5\' 8"'$  (1.727 m)   Wt 281 lb 6.4 oz (127.6 kg)   SpO2 98%   BMI 42.79 kg/m? , BMI Body mass index is 42.79 kg/m?. ? ?Wt Readings from Last 3 Encounters:  ?01/13/22 281 lb 6.4 oz (127.6 kg)  ?07/17/21 274 lb 12.8 oz (124.6 kg)  ?12/30/20 264 lb (119.7 kg)  ?  ?General: Patient appears comfortable at rest. ?HEENT: Conjunctiva and lids normal, wearing a mask. ?Neck: Supple, no elevated JVP or carotid bruits, no thyromegaly. ?Lungs: Clear to auscultation, nonlabored breathing at  rest. ?Cardiac: Regular rate and rhythm, no S3 or significant systolic murmur, no pericardial rub. ?Extremities: Mild lower leg edema. ? ?ECG:  An ECG dated 08/15/2020 was personally reviewed today and demonstrated:  Sinus rhythm with prolonged PR interval, leftward axis, decreased R wave progression, nonspecific T wave changes. ? ?Recent Labwork: ?07/17/2021: BUN 39; Creatinine, Ser 2.14; Potassium 5.0; Sodium 139  ?   ?Component Value Date/Time  ? CHOL 128 08/16/2020 0513  ? TRIG 110 08/16/2020 0513  ? HDL 30 (L) 08/16/2020 0513  ? CHOLHDL 4.3 08/16/2020 0513  ? VLDL 22 08/16/2020 0513  ? [place] 76 08/16/2020 0513  ? [place] 98 03/07/2020 1158  ?December 2022: Hemoglobin 11.7, platelets 173, potassium 4.9, BUN 37, creatinine 2.12, AST 20, ALT 28 ? ?Other Studies Reviewed Today: ? ?Echocardiogram 01/08/2022: ? 1. Left ventricular ejection fraction, by estimation, is 55 to 60%. The  ?left ventricle has normal function. The left ventricle has no regional  ?wall motion abnormalities. There is moderate concentric left ventricular  ?hypertrophy. Left ventricular  ?diastolic parameters were normal.  ? 2. RV-RA gradient 26 mmHg suggesting normal estimated RVSP in setting of  ?normal CVP. Right ventricular systolic function is normal. The right  ?ventricular size is normal.  ? 3. Left atrial size was mildly dilated.  ? 4. The mitral valve is grossly normal. Trivial mitral valve  ?regurgitation.  ? 5. The aortic valve is tricuspid. There is mild calcification of the  ?aortic valve. Aortic valve regurgitation is not visualized. Aortic valve  ?sclerosis/calcification is present, without any evidence of aortic  ?stenosis.  ? 6. Unable to estimate CVP.  ? ?Assessment and Plan: ? ?1.  Multivessel CAD status post CABG in 2007.  Recent follow-up echocardiogram revealed normal LVEF at 55 to 60%.  He does not describe any active angina at this time.  Continue Lopressor, Imdur, Norvasc, losartan, and Lipitor. ? ?2.  Paroxysmal atrial  fibrillation/flutter with CHA2DS2-VASc score of 6.  He is in sinus rhythm today and reports no palpitations.  Continue Lopressor and Eliquis.  I reviewed his lab work from December 2022. ? ?3.  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 ? ?Medication Adjustments/Labs and Tests Ordered: ?Current medicines are reviewed at length with the patient today.  Concerns regarding medicines are outlined above.  ? ?Tests Ordered: ?Orders Placed This Encounter  ?Procedures  ? EKG 12-Lead  ? ? ?Medication Changes: ?No orders of the defined types were placed in this encounter. ? ? ?Disposition:  Follow up  6 months. ? ?Signed, ?Satira Sark, MD,  FACC ?01/13/2022 3:46 PM    ?[facility] [facility] ?[place], [place],  [address] ?Phone: [phone]; Fax: [phone]  ?

## 2022-01-13 NOTE — Patient Instructions (Signed)

## 2022-01-18 ENCOUNTER — Ambulatory Visit: Payer: Medicare Other | Admitting: Cardiology

## 2022-01-28 DIAGNOSIS — E1165 Type 2 diabetes mellitus with hyperglycemia: Secondary | ICD-10-CM | POA: Diagnosis not present

## 2022-02-15 DIAGNOSIS — Z87891 Personal history of nicotine dependence: Secondary | ICD-10-CM | POA: Diagnosis not present

## 2022-02-15 DIAGNOSIS — I10 Essential (primary) hypertension: Secondary | ICD-10-CM | POA: Diagnosis not present

## 2022-02-15 DIAGNOSIS — E1165 Type 2 diabetes mellitus with hyperglycemia: Secondary | ICD-10-CM | POA: Diagnosis not present

## 2022-02-15 DIAGNOSIS — Z299 Encounter for prophylactic measures, unspecified: Secondary | ICD-10-CM | POA: Diagnosis not present

## 2022-02-28 DIAGNOSIS — E1165 Type 2 diabetes mellitus with hyperglycemia: Secondary | ICD-10-CM | POA: Diagnosis not present

## 2022-03-25 ENCOUNTER — Encounter (HOSPITAL_COMMUNITY): Payer: Self-pay | Admitting: Emergency Medicine

## 2022-03-25 ENCOUNTER — Emergency Department (HOSPITAL_COMMUNITY)
Admission: EM | Admit: 2022-03-25 | Discharge: 2022-03-25 | Disposition: A | Discharge status: Home / Self Care | Payer: No Typology Code available for payment source | Attending: Emergency Medicine | Admitting: Emergency Medicine

## 2022-03-25 ENCOUNTER — Emergency Department (HOSPITAL_COMMUNITY): Payer: No Typology Code available for payment source

## 2022-03-25 DIAGNOSIS — Z9049 Acquired absence of other specified parts of digestive tract: Secondary | ICD-10-CM | POA: Insufficient documentation

## 2022-03-25 DIAGNOSIS — N183 Chronic kidney disease, stage 3 unspecified: Secondary | ICD-10-CM | POA: Insufficient documentation

## 2022-03-25 DIAGNOSIS — Z794 Long term (current) use of insulin: Secondary | ICD-10-CM | POA: Insufficient documentation

## 2022-03-25 DIAGNOSIS — R079 Chest pain, unspecified: Secondary | ICD-10-CM | POA: Insufficient documentation

## 2022-03-25 DIAGNOSIS — Z8673 Personal history of transient ischemic attack (TIA), and cerebral infarction without residual deficits: Secondary | ICD-10-CM | POA: Diagnosis not present

## 2022-03-25 DIAGNOSIS — I2510 Atherosclerotic heart disease of native coronary artery without angina pectoris: Secondary | ICD-10-CM | POA: Diagnosis not present

## 2022-03-25 DIAGNOSIS — E875 Hyperkalemia: Secondary | ICD-10-CM | POA: Insufficient documentation

## 2022-03-25 DIAGNOSIS — Z79899 Other long term (current) drug therapy: Secondary | ICD-10-CM | POA: Insufficient documentation

## 2022-03-25 DIAGNOSIS — Z7901 Long term (current) use of anticoagulants: Secondary | ICD-10-CM | POA: Insufficient documentation

## 2022-03-25 DIAGNOSIS — Z951 Presence of aortocoronary bypass graft: Secondary | ICD-10-CM | POA: Insufficient documentation

## 2022-03-25 DIAGNOSIS — E119 Type 2 diabetes mellitus without complications: Secondary | ICD-10-CM | POA: Diagnosis not present

## 2022-03-25 DIAGNOSIS — Z7984 Long term (current) use of oral hypoglycemic drugs: Secondary | ICD-10-CM | POA: Insufficient documentation

## 2022-03-25 DIAGNOSIS — I129 Hypertensive chronic kidney disease with stage 1 through stage 4 chronic kidney disease, or unspecified chronic kidney disease: Secondary | ICD-10-CM | POA: Diagnosis not present

## 2022-03-25 DIAGNOSIS — N189 Chronic kidney disease, unspecified: Secondary | ICD-10-CM

## 2022-03-25 LAB — CBC
HCT: 37.3 % — ABNORMAL LOW (ref 39.0–52.0)
Hemoglobin: 11.8 g/dL — ABNORMAL LOW (ref 13.0–17.0)
MCH: 31.1 pg (ref 26.0–34.0)
MCHC: 31.6 g/dL (ref 30.0–36.0)
MCV: 98.2 fL (ref 80.0–100.0)
Platelets: 175 10*3/uL (ref 150–400)
RBC: 3.8 MIL/uL — ABNORMAL LOW (ref 4.22–5.81)
RDW: 13.3 % (ref 11.5–15.5)
WBC: 5.1 10*3/uL (ref 4.0–10.5)
nRBC: 0 % (ref 0.0–0.2)

## 2022-03-25 LAB — BASIC METABOLIC PANEL
Anion gap: 3 — ABNORMAL LOW (ref 5–15)
BUN: 32 mg/dL — ABNORMAL HIGH (ref 8–23)
CO2: 27 mmol/L (ref 22–32)
Calcium: 9 mg/dL (ref 8.9–10.3)
Chloride: 105 mmol/L (ref 98–111)
Creatinine, Ser: 2.32 mg/dL — ABNORMAL HIGH (ref 0.61–1.24)
GFR, Estimated: 28 mL/min — ABNORMAL LOW (ref >60)
Glucose, Bld: 217 mg/dL — ABNORMAL HIGH (ref 70–99)
Potassium: 5.6 mmol/L — ABNORMAL HIGH (ref 3.5–5.1)
Sodium: 135 mmol/L (ref 135–145)

## 2022-03-25 LAB — POTASSIUM: Potassium: 5.5 mmol/L — ABNORMAL HIGH (ref 3.5–5.1)

## 2022-03-25 LAB — TROPONIN I (HIGH SENSITIVITY)
Troponin I (High Sensitivity): 5 ng/L (ref <18)
Troponin I (High Sensitivity): 5 ng/L (ref <18)

## 2022-03-25 MED ORDER — SODIUM ZIRCONIUM CYCLOSILICATE 5 G PO PACK
10.0000 g | PACK | Freq: Once | ORAL | Status: AC
  Administered 2022-03-25: 10 g via ORAL
  Filled 2022-03-25: qty 2

## 2022-03-25 MED ORDER — SODIUM CHLORIDE 0.9 % IV BOLUS
1000.0000 mL | Freq: Once | INTRAVENOUS | Status: AC
Start: 2022-03-25 — End: 2022-03-25
  Administered 2022-03-25: 1000 mL via INTRAVENOUS

## 2022-03-25 MED ORDER — LOKELMA 10 G PO PACK: 10.0000 g | PACK | Freq: Every day | ORAL | 0 refills | Status: AC

## 2022-03-25 MED ORDER — SODIUM CHLORIDE 0.9% FLUSH
3.0000 mL | Freq: Once | INTRAVENOUS | Status: AC
  Administered 2022-03-25: 3 mL via INTRAVENOUS

## 2022-03-25 NOTE — Discharge Instructions (Signed)
Take 1 packet of the medication prescribed tomorrow and then again on Saturday.  Have your potassium level rechecked on Monday.  Return here in the interim if you have any new or worsening symptoms.  Your lab tests today are reassuring however if you continue to have episodes of chest pain you need to be reevaluated, I do recommend you calling your cardiologist for an office visit for further management of your symptoms.  In the interim return here as needed.

## 2022-03-25 NOTE — ED Notes (Signed)
Ambulatory back from b/r. Reports CP resolved. C/o HA.

## 2022-03-25 NOTE — ED Notes (Signed)
IVF bolus continues

## 2022-03-25 NOTE — ED Notes (Signed)
Lokelma given, pt eating, IVF infusing

## 2022-03-25 NOTE — ED Notes (Signed)
Lab at [facility] 

## 2022-03-25 NOTE — ED Provider Notes (Signed)
[facility] EMERGENCY DEPARTMENT Provider Note   CSN: [id number] Arrival date & time: 03/25/22  1010     History  Chief Complaint  Patient presents with   Chest Pain    [name] is a 81 y.o. male with a history significant for hypertension, type 2 diabetes, CAD with a history of CABG, history of CVA with residual impaired balance, along with atrial fibrillation on Eliquis and stage III kidney disease presenting for evaluation of chest pain.  He describes left-sided chest pressure which he noted when he first woke up this morning around 9 AM.  He states his symptoms were present for 1-1/2 to 2 hours, resolving near the time of his arrival here.  He describes dull pain without radiation and is not accompanied by diaphoresis, palpitations, nausea or vomiting or any other complaints.  His symptom responded spontaneously.  He denies ever having a history of chest pain previously.  He states that his CAD was diagnosed during a preop evaluation for cholecystectomy and he underwent CABG at that time.  He is under the care of Dr. Domenic Polite.  He is currently symptom-free.  The history is provided by the patient and the spouse.   HPI: A 71 year old patient with a history of CVA, treated diabetes, hypertension, hypercholesterolemia and obesity presents for evaluation of chest pain. Initial onset of pain was approximately 3-6 hours ago. The patient's chest pain is described as heaviness/pressure/tightness and is not worse with exertion. The patient's chest pain is middle- or left-sided, is not well-localized, is not sharp and does not radiate to the arms/jaw/neck. The patient does not complain of nausea and denies diaphoresis. The patient has no history of peripheral artery disease, has not smoked in the past 90 days and has no relevant family history of coronary artery disease (first degree relative at less than age 57).   Home Medications Prior to Admission medications   Medication Sig Start Date End  Date Taking? Authorizing Provider  acetaminophen (TYLENOL) 650 MG CR tablet Take 1,300 mg by mouth every 8 (eight) hours as needed for pain.   Yes [provider]  albuterol (VENTOLIN HFA) 108 (90 Base) MCG/ACT inhaler Inhale 1-2 puffs into the lungs every 6 (six) hours as needed for shortness of breath. 03/23/22  Yes [provider]  amLODipine (NORVASC) 10 MG tablet Take 10 mg by mouth daily.   Yes [provider]  apixaban (ELIQUIS) 5 MG TABS tablet Take 5 mg by mouth 2 (two) times daily.   Yes [provider]  atorvastatin (LIPITOR) 80 MG tablet Take 80 mg by mouth every evening.    Yes [provider]  carboxymethylcellulose (REFRESH PLUS) 0.5 % SOLN Place 1 drop into both eyes 2 (two) times daily as needed for dry eyes. 03/09/22  Yes [provider]  Cyanocobalamin (VITAMIN B 12) 500 MCG TABS Take 1,000 mcg by mouth daily. 03/07/20  Yes , Modena Nunnery, MD  ferrous sulfate 325 (65 FE) MG tablet Take 1 tablet (325 mg total) by mouth daily with breakfast. 05/09/20  Yes Strader, [place] M, PA-C  glucose-Vitamin C 4-0.006 GM CHEW chewable tablet Chew 4 tablets by mouth as directed. CHEW FOUR TABLETS BY MOUTH AS DIRECTED BY YOUR PROVIDER (REPEAT EVERY 15 MINUTES IF BLOOD SUGAR LESS THAN 70) 03/09/22  Yes [provider]  insulin aspart protamine- aspart (NOVOLOG MIX 70/30) (70-30) 100 UNIT/ML injection 42 units in the morning and 50 units at night. Patient taking differently: Inject 40 Units into the  skin 2 (two) times daily with a meal. 11/05/20  Yes [place], Modena Nunnery, MD  isosorbide mononitrate (IMDUR) 30 MG 24 hr tablet Take 1 tablet (30 mg total) by mouth every evening. 01/08/22 04/08/22 Yes Satira Sark, MD  losartan (COZAAR) 100 MG tablet Take 100 mg by mouth daily.   Yes [provider]  metFORMIN (GLUCOPHAGE) 1000 MG tablet Take 1,000 mg by mouth 2 (two) times daily with a meal.   Yes [provider]  metoprolol  tartrate (LOPRESSOR) 50 MG tablet Take 25 mg by mouth 2 (two) times daily.   Yes [provider]  mycophenolate (CELLCEPT) 500 MG tablet Take 100 mg by mouth in the morning and at bedtime. 03/16/22  Yes [provider]  pioglitazone (ACTOS) 30 MG tablet Take 30 mg by mouth daily. 03/09/22  Yes [provider]  Semaglutide (OZEMPIC, 0.25 OR 0.5 MG/DOSE, [place]) Inject 0.25 mg into the skin once a week. 02/26/22  Yes [provider]  sodium zirconium cyclosilicate (LOKELMA) 10 g PACK packet Take 10 g by mouth daily for 2 days. 03/25/22 03/27/22 Yes Timonthy Hovater, Almyra Free, PA-C  spironolactone (ALDACTONE) 25 MG tablet Take 25 mg by mouth daily.   Yes [provider]  tamsulosin (FLOMAX) 0.4 MG CAPS capsule Take 2 capsules (0.8 mg total) by mouth every evening. 06/09/16  Yes [place], Modena Nunnery, MD  Tiotropium Bromide-Olodaterol 2.5-2.5 MCG/ACT AERS Inhale 2 puffs into the lungs daily. 03/23/22  Yes [provider]  torsemide (DEMADEX) 20 MG tablet Take 20 mg daily alternating with 40 mg the next day Patient taking differently: Take 20-40 mg by mouth daily. Take 20 mg daily alternating with 40 mg the next day 07/17/21  Yes Satira Sark, MD  Adalimumab 40 MG/0.8ML PNKT Inject into the skin. 10/07/20   [provider]      Allergies    Ivp dye [iodinated contrast media]    Review of Systems   Review of Systems  Constitutional:  Negative for diaphoresis and fever.  HENT:  Negative for congestion and sore throat.   Eyes: Negative.   Respiratory:  Negative for chest tightness and shortness of breath.   Cardiovascular:  Positive for chest pain. Negative for palpitations and leg swelling.  Gastrointestinal:  Negative for abdominal pain, nausea and vomiting.  Genitourinary: Negative.   Musculoskeletal:  Negative for arthralgias, joint swelling and neck pain.  Skin: Negative.  Negative for rash and wound.  Neurological:  Negative for dizziness, weakness,  light-headedness, numbness and headaches.  Psychiatric/Behavioral: Negative.    All other systems reviewed and are negative.  Physical Exam Updated Vital Signs BP (!) 127/53   Pulse (!) 57   Temp 98.7 F (37.1 C)   Resp 15   Ht '5\' 8"'$  (1.727 m)   Wt 125.3 kg   SpO2 100%   BMI 42.00 kg/m  Physical Exam Vitals and nursing note reviewed.  Constitutional:      Appearance: He is well-developed.  HENT:     Head: Normocephalic and atraumatic.  Eyes:     Conjunctiva/sclera: Conjunctivae normal.  Cardiovascular:     Rate and Rhythm: Normal rate and regular rhythm.     Heart sounds: Normal heart sounds.  Pulmonary:     Effort: Pulmonary effort is normal.     Breath sounds: Normal breath sounds. No wheezing, rhonchi or rales.  Abdominal:     General: Bowel sounds are normal.     Palpations: Abdomen is soft.  Tenderness: There is no abdominal tenderness.  Musculoskeletal:        General: Normal range of motion.     Cervical back: Normal range of motion.     Right lower leg: No edema.     Left lower leg: No edema.  Skin:    General: Skin is warm and dry.  Neurological:     Mental Status: He is alert.    ED Results / Procedures / Treatments   Labs (all labs ordered are listed, but only abnormal results are displayed) Labs Reviewed  BASIC METABOLIC PANEL - Abnormal; Notable for the following components:      Result Value   Potassium 5.6 (*)    Glucose, Bld 217 (*)    BUN 32 (*)    Creatinine, Ser 2.32 (*)    GFR, Estimated 28 (*)    Anion gap 3 (*)    All other components within normal limits  CBC - Abnormal; Notable for the following components:   RBC 3.80 (*)    Hemoglobin 11.8 (*)    HCT 37.3 (*)    All other components within normal limits  POTASSIUM - Abnormal; Notable for the following components:   Potassium 5.5 (*)    All other components within normal limits  TROPONIN I (HIGH SENSITIVITY)  TROPONIN I (HIGH SENSITIVITY)    EKG EKG  Interpretation  Date/Time:  Thursday Mar 25 2022 10:23:00 EDT Ventricular Rate:  62 PR Interval:  218 QRS Duration: 102 QT Interval:  387 QTC Calculation: 393 R Axis:   -68 Text Interpretation: Sinus rhythm Borderline prolonged PR interval Left anterior fascicular block Confirmed by Octaviano Glow [phone]) on 03/25/2022 11:52:56 AM  Radiology DG Chest 2 View  Result Date: 03/25/2022 CLINICAL DATA:  Chest pain EXAM: CHEST - 2 VIEW COMPARISON:  05/01/2020 FINDINGS: Mild cardiomegaly status post median sternotomy. Both lungs are clear. The visualized skeletal structures are unremarkable. IMPRESSION: Mild cardiomegaly without acute abnormality of the lungs. Electronically Signed   By: Delanna Ahmadi M.D.   On: 03/25/2022 10:40    Procedures Procedures    Medications Ordered in ED Medications  sodium chloride flush (NS) 0.9 % injection 3 mL (3 mLs Intravenous Given 03/25/22 1153)  sodium zirconium cyclosilicate (LOKELMA) packet 10 g (10 g Oral Given 03/25/22 1427)  sodium chloride 0.9 % bolus 1,000 mL (1,000 mLs Intravenous New Bag/Given 03/25/22 1422)    ED Course/ Medical Decision Making/ A&P   HEAR Score: 5                       Medical Decision Making This is a patient with significant risk factors for unstable angina, however he remains chest pain-free while in the emergency department and he had a unchanged EKG and his delta troponins have been negative and flat x2.  He does have a heart score of 5, but with the negative troponins and symptom-free presentation, I do not feel strongly he needs to be admitted.  We did discuss this possibility but patient is very resistant to admission to the hospital at this time.  His wife at the bedside agrees with that plan.  He did have a significant hyperkalemia at 5.5.  He does have renal insufficiency and is awaiting nephrology consult being arranged by his primary MD.  He does have potassium listed in his medication list but it was confirmed with he  and his wife that this was discontinued sometime ago.  He does appear to be  modestly dehydrated as he does have a BUN of 32 creatinine of 2.32, this creatinine is relatively stable however.  Although he was anxious to be discharged she was agreeable to receive 1 L of IV fluids which he was given.  He was also given a dose of Lokelma.  He will prescribed 2 additional treatments for the next 2 days.  He was asked to follow-up with his PCP for lab visit on Monday for recheck of his potassium level.  He was stable at time of discharge.  Amount and/or Complexity of Data Reviewed Labs: ordered.    Details: Per above Labs are significant for a high hyperkalemia at 5.6, this was repeated and remains elevated at 5.5.  He also has a glucose level of 217 without a significant anion gap.  Troponins x2 are 5 and flat. Radiology: ordered and independent interpretation performed.    Details: Chest x-ray revealing for mild cardiomegaly, no other cardiopulmonary abnormalities. ECG/medicine tests: ordered and independent interpretation performed.    Details: LAD, unchanged from prior.  Risk Prescription drug management.           Final Clinical Impression(s) / ED Diagnoses Final diagnoses:  Chest pain, unspecified type  Hyperkalemia  Chronic renal impairment, unspecified CKD stage    Rx / DC Orders ED Discharge Orders          Ordered    sodium zirconium cyclosilicate (LOKELMA) 10 g PACK packet  Daily        03/25/22 1611              Evalee Jefferson, PA-C 03/25/22 1625    Wyvonnia Dusky, MD 03/25/22 [phone]

## 2022-03-25 NOTE — ED Triage Notes (Signed)
Pt to the ED with complaints of left side chest pain that began at 0830 this morning.  Pt states it is an intermittent dull pain and does not radiate.

## 2022-03-25 NOTE — ED Notes (Signed)
Delay in IVF r/t infiltation

## 2022-04-07 DIAGNOSIS — Z87891 Personal history of nicotine dependence: Secondary | ICD-10-CM | POA: Diagnosis not present

## 2022-04-07 DIAGNOSIS — Z299 Encounter for prophylactic measures, unspecified: Secondary | ICD-10-CM | POA: Diagnosis not present

## 2022-04-07 DIAGNOSIS — J449 Chronic obstructive pulmonary disease, unspecified: Secondary | ICD-10-CM | POA: Diagnosis not present

## 2022-04-07 DIAGNOSIS — N183 Chronic kidney disease, stage 3 unspecified: Secondary | ICD-10-CM | POA: Diagnosis not present

## 2022-04-07 DIAGNOSIS — Z0000 Encounter for general adult medical examination without abnormal findings: Secondary | ICD-10-CM | POA: Diagnosis not present

## 2022-04-07 DIAGNOSIS — Z7189 Other specified counseling: Secondary | ICD-10-CM | POA: Diagnosis not present

## 2022-04-07 DIAGNOSIS — I10 Essential (primary) hypertension: Secondary | ICD-10-CM | POA: Diagnosis not present

## 2022-04-13 ENCOUNTER — Encounter (HOSPITAL_COMMUNITY)
Admission: RE | Admit: 2022-04-13 | Discharge: 2022-04-13 | Disposition: A | Discharge status: Home / Self Care | Payer: No Typology Code available for payment source | Source: Ambulatory Visit | Attending: Critical Care Medicine | Admitting: Critical Care Medicine

## 2022-04-13 ENCOUNTER — Encounter (HOSPITAL_COMMUNITY): Payer: Self-pay

## 2022-04-13 VITALS — BP 122/50 | HR 64 | Ht 68.0 in | Wt 282.6 lb

## 2022-04-13 DIAGNOSIS — J449 Chronic obstructive pulmonary disease, unspecified: Secondary | ICD-10-CM | POA: Diagnosis present

## 2022-04-13 NOTE — Progress Notes (Signed)
Pulmonary Individual Treatment Plan  Patient Details  Name: DOMINIQUE CALVEY MRN: [id number] Date of Birth: 11-16-1940 Referring Provider:   Flowsheet Row PULMONARY REHAB COPD ORIENTATION from 04/13/2022 in [facility]  Referring Provider Dr. Rosaria Ferries       Initial Encounter Date:  Flowsheet Row PULMONARY REHAB COPD ORIENTATION from 04/13/2022 in [facility]  Date 04/13/22       Visit Diagnosis: Chronic obstructive pulmonary disease, unspecified COPD type ([place])  Patient's Home Medications on Admission:   Current Outpatient Medications:    acetaminophen (TYLENOL) 650 MG CR tablet, Take 1,300 mg by mouth every 8 (eight) hours as needed for pain., Disp: , Rfl:    Adalimumab 40 MG/0.8ML PNKT, Inject into the skin., Disp: , Rfl:    albuterol (VENTOLIN HFA) 108 (90 Base) MCG/ACT inhaler, Inhale 1-2 puffs into the lungs every 6 (six) hours as needed for shortness of breath., Disp: , Rfl:    amLODipine (NORVASC) 10 MG tablet, Take 10 mg by mouth daily., Disp: , Rfl:    apixaban (ELIQUIS) 5 MG TABS tablet, Take 5 mg by mouth 2 (two) times daily., Disp: , Rfl:    atorvastatin (LIPITOR) 80 MG tablet, Take 80 mg by mouth every evening. , Disp: , Rfl:    carboxymethylcellulose (REFRESH PLUS) 0.5 % SOLN, Place 1 drop into both eyes 2 (two) times daily as needed for dry eyes., Disp: , Rfl:    Cyanocobalamin (VITAMIN B 12) 500 MCG TABS, Take 1,000 mcg by mouth daily., Disp: 180 tablet, Rfl: 1   ferrous sulfate 325 (65 FE) MG tablet, Take 1 tablet (325 mg total) by mouth daily with breakfast., Disp: 90 tablet, Rfl: 3   glucose-Vitamin C 4-0.006 GM CHEW chewable tablet, Chew 4 tablets by mouth as directed. CHEW FOUR TABLETS BY MOUTH AS DIRECTED BY YOUR PROVIDER (REPEAT EVERY 15 MINUTES IF BLOOD SUGAR LESS THAN 70), Disp: , Rfl:    insulin aspart protamine- aspart (NOVOLOG MIX 70/30) (70-30) 100 UNIT/ML injection, 42 units in the morning and 50 units at night.  (Patient taking differently: Inject 40 Units into the skin 2 (two) times daily with a meal.), Disp: 10 mL, Rfl: 0   isosorbide mononitrate (IMDUR) 30 MG 24 hr tablet, Take 1 tablet (30 mg total) by mouth every evening., Disp: 90 tablet, Rfl: 3   losartan (COZAAR) 100 MG tablet, Take 100 mg by mouth daily., Disp: , Rfl:    metFORMIN (GLUCOPHAGE) 1000 MG tablet, Take 1,000 mg by mouth 2 (two) times daily with a meal., Disp: , Rfl:    metoprolol tartrate (LOPRESSOR) 50 MG tablet, Take 25 mg by mouth 2 (two) times daily., Disp: , Rfl:    mycophenolate (CELLCEPT) 500 MG tablet, Take 100 mg by mouth in the morning and at bedtime., Disp: , Rfl:    pioglitazone (ACTOS) 30 MG tablet, Take 30 mg by mouth daily., Disp: , Rfl:    Semaglutide (OZEMPIC, 0.25 OR 0.5 MG/DOSE, [place]), Inject 0.25 mg into the skin once a week., Disp: , Rfl:    spironolactone (ALDACTONE) 25 MG tablet, Take 25 mg by mouth daily., Disp: , Rfl:    tamsulosin (FLOMAX) 0.4 MG CAPS capsule, Take 2 capsules (0.8 mg total) by mouth every evening., Disp: 60 capsule, Rfl: 3   Tiotropium Bromide-Olodaterol 2.5-2.5 MCG/ACT AERS, Inhale 2 puffs into the lungs daily., Disp: , Rfl:    torsemide (DEMADEX) 20 MG tablet, Take 20 mg daily alternating with 40 mg the next day (  Patient taking differently: Take 20-40 mg by mouth daily. Take 20 mg daily alternating with 40 mg the next day), Disp: 135 tablet, Rfl: 3  Past Medical History: Past Medical History:  Diagnosis Date   Anemia    C. 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/2011   GERD (gastroesophageal reflux disease)    Glaucoma    Hyperlipidemia    Hypertension    Morbid obesity ([place])    PAF (paroxysmal atrial fibrillation) ([place])    Peripheral vascular disease ([place])    Sleep apnea    Stroke ([place]) 2008, 2014,  2015    Tobacco Use: Social History   Tobacco Use  Smoking Status Former   Packs/day: 1.00   Years: 20.00   Total pack years: 20.00   Types: Cigarettes   Quit date: 43   Years since quitting: 40.4  Smokeless Tobacco Never    Labs: Review Flowsheet  More data exists      Latest Ref Rng & Units 08/10/2018 06/08/2019 03/07/2020 07/08/2020  Labs for ITP Cardiac and Pulmonary Rehab  Cholestrol 0 - 200 mg/dL - - 154  -  LDL (calc) 0 - 99 mg/dL - - 98  -  HDL-C >40 mg/dL - - 35  -  Trlycerides <150 mg/dL - - 118  -  Hemoglobin A1c 4.8 - 5.6 % 7.1  7.6  8.2  8.3       08/16/2020  Labs for ITP Cardiac and Pulmonary Rehab  Cholestrol 128   LDL (calc) 76   HDL-C 30   Trlycerides 110   Hemoglobin A1c 8.4     Capillary Blood Glucose: Lab Results  Component Value Date   GLUCAP 214 (H) 08/16/2020   GLUCAP 177 (H) 05/03/2020   GLUCAP 277 (H) 05/02/2020   GLUCAP 294 (H) 05/02/2020   GLUCAP 261 (H) 05/02/2020    POCT Glucose     Row Name 04/13/22 0831             POCT Blood Glucose   Pre-Exercise 266 mg/dL                Pulmonary Assessment Scores:  Pulmonary Assessment Scores     Row Name 04/13/22 0837         ADL UCSD   SOB Score total 84     Rest 1     Walk 3     Stairs 5     Bath 0     Dress 3     Shop 5       CAT Score   CAT Score 22       mMRC Score   mMRC Score 4             UCSD: Self-administered rating of dyspnea associated with activities of daily living (ADLs) 6-point scale (0 = "not at all" to 5 = "maximal or unable to do because of breathlessness")  Scoring Scores range from 0 to 120.  Minimally important difference is 5 units  CAT: CAT can identify the health impairment of COPD patients and is better correlated with disease progression.  CAT has a scoring range of zero to 40. The CAT score is classified into four groups of low (less than 10), medium (10 - 20), high (21-30) and very high (  31-40) based on the impact level of  disease on health status. A CAT score over 10 suggests significant symptoms.  A worsening CAT score could be explained by an exacerbation, poor medication adherence, poor inhaler technique, or progression of COPD or comorbid conditions.  CAT MCID is 2 points  mMRC: mMRC (Modified Medical Research Council) Dyspnea Scale is used to assess the degree of baseline functional disability in patients of respiratory disease due to dyspnea. No minimal important difference is established. A decrease in score of 1 point or greater is considered a positive change.   Pulmonary Function Assessment:   Exercise Target Goals: Exercise Program Goal: Individual exercise prescription set using results from initial 6 min walk test and THRR while considering  patient's activity barriers and safety.   Exercise Prescription Goal: Initial exercise prescription builds to 30-45 minutes a day of aerobic activity, 2-3 days per week.  Home exercise guidelines will be given to patient during program as part of exercise prescription that the participant will acknowledge.  Activity Barriers & Risk Stratification:  Activity Barriers & Cardiac Risk Stratification - 04/13/22 0908       Activity Barriers & Cardiac Risk Stratification   Activity Barriers Back Problems;Deconditioning;Shortness of Breath;Balance Concerns;History of Falls;Assistive Device    Cardiac Risk Stratification High             6 Minute Walk:  6 Minute Walk     Row Name 04/13/22 0908         6 Minute Walk   Phase Initial     Distance 400 feet     Walk Time 6 minutes     # of Rest Breaks 1     MPH 0.75     METS -0.2     RPE 13     Perceived Dyspnea  13     VO2 Peak -0.73     Symptoms Yes (comment)     Comments One sitting break 40 minutes due to weakness in leg     Resting HR 64 bpm     Resting BP 122/50     Resting Oxygen Saturation  98 %     Exercise Oxygen Saturation  during 6 min walk 97 %     Max Ex. HR 82 bpm     Max Ex. BP  136/58     2 Minute Post BP 118/50       Interval HR   1 Minute HR 79     2 Minute HR 81     3 Minute HR 82     4 Minute HR 76     5 Minute HR 82     6 Minute HR 82     2 Minute Post HR 70     Interval Heart Rate? Yes       Interval Oxygen   Interval Oxygen? Yes     Baseline Oxygen Saturation % 98 %     1 Minute Oxygen Saturation % 97 %     1 Minute Liters of Oxygen 0 L     2 Minute Oxygen Saturation % 98 %     2 Minute Liters of Oxygen 0 L     3 Minute Oxygen Saturation % 97 %     3 Minute Liters of Oxygen 0 L     4 Minute Oxygen Saturation % 98 %     4 Minute Liters of Oxygen 0 L     5 Minute Oxygen Saturation % 98 %  5 Minute Liters of Oxygen 0 L     6 Minute Oxygen Saturation % 97 %     6 Minute Liters of Oxygen 0 L     2 Minute Post Oxygen Saturation % 97 %     2 Minute Post Liters of Oxygen 0 L              Oxygen Initial Assessment:  Oxygen Initial Assessment - 04/13/22 0857       Initial 6 min Walk   Oxygen Used None      Program Oxygen Prescription   Program Oxygen Prescription None      Intervention   Short Term Goals To learn and understand importance of monitoring SPO2 with pulse oximeter and demonstrate accurate use of the pulse oximeter.;To learn and understand importance of maintaining oxygen saturations>88%;To learn and demonstrate proper pursed lip breathing techniques or other breathing techniques. ;To learn and demonstrate proper use of respiratory medications    Long  Term Goals Verbalizes importance of monitoring SPO2 with pulse oximeter and return demonstration;Maintenance of O2 saturations>88%;Exhibits proper breathing techniques, such as pursed lip breathing or other method taught during program session;Compliance with respiratory medication             Oxygen Re-Evaluation:   Oxygen Discharge (Final Oxygen Re-Evaluation):   Initial Exercise Prescription:  Initial Exercise Prescription - 04/13/22 0900       Date of Initial  Exercise RX and Referring Provider   Date 04/13/22    Referring Provider Dr. Rosaria Ferries    Expected Discharge Date 08/26/22      NuStep   Level 1    SPM 60    Minutes 22      Arm Ergometer   Level 1    RPM 50    Minutes 22      Prescription Details   Frequency (times per week) 2    Duration Progress to 30 minutes of continuous aerobic without signs/symptoms of physical distress      Intensity   THRR 40-80% of Max Heartrate 56-113    Ratings of Perceived Exertion 11-13    Perceived Dyspnea 0-4      Resistance Training   Training Prescription Yes    Weight 3    Reps 10-15             Perform Capillary Blood Glucose checks as needed.  Exercise Prescription Changes:   Exercise Comments:   Exercise Goals and Review:   Exercise Goals     Row Name 04/13/22 0912             Exercise Goals   Increase Physical Activity Yes       Intervention Provide advice, education, support and counseling about physical activity/exercise needs.;Develop an individualized exercise prescription for aerobic and resistive training based on initial evaluation findings, risk stratification, comorbidities and participant's personal goals.       Expected Outcomes Short Term: Attend rehab on a regular basis to increase amount of physical activity.;Long Term: Add in home exercise to make exercise part of routine and to increase amount of physical activity.;Long Term: Exercising regularly at least 3-5 days a week.       Increase Strength and Stamina Yes       Intervention Provide advice, education, support and counseling about physical activity/exercise needs.;Develop an individualized exercise prescription for aerobic and resistive training based on initial evaluation findings, risk stratification, comorbidities and participant's personal goals.       Expected Outcomes Short  Term: Increase workloads from initial exercise prescription for resistance, speed, and METs.;Short Term: Perform resistance  training exercises routinely during rehab and add in resistance training at home;Long Term: Improve cardiorespiratory fitness, muscular endurance and strength as measured by increased METs and functional capacity (6MWT)       Able to understand and use rate of perceived exertion (RPE) scale Yes       Intervention Provide education and explanation on how to use RPE scale       Expected Outcomes Short Term: Able to use RPE daily in rehab to express subjective intensity level;Long Term:  Able to use RPE to guide intensity level when exercising independently       Able to understand and use Dyspnea scale Yes       Intervention Provide education and explanation on how to use Dyspnea scale       Expected Outcomes Short Term: Able to use Dyspnea scale daily in rehab to express subjective sense of shortness of breath during exertion;Long Term: Able to use Dyspnea scale to guide intensity level when exercising independently       Knowledge and understanding of Target Heart Rate Range (THRR) Yes       Intervention Provide education and explanation of THRR including how the numbers were predicted and where they are located for reference       Expected Outcomes Short Term: Able to state/look up THRR;Long Term: Able to use THRR to govern intensity when exercising independently;Short Term: Able to use daily as guideline for intensity in rehab       Understanding of Exercise Prescription Yes       Intervention Provide education, explanation, and written materials on patient's individual exercise prescription       Expected Outcomes Short Term: Able to explain program exercise prescription;Long Term: Able to explain home exercise prescription to exercise independently                Exercise Goals Re-Evaluation :   Discharge Exercise Prescription (Final Exercise Prescription Changes):   Nutrition:  Target Goals: Understanding of nutrition guidelines, daily intake of sodium '1500mg'$ , cholesterol '200mg'$ ,  calories 30% from fat and 7% or less from saturated fats, daily to have 5 or more servings of fruits and vegetables.  Biometrics:  Pre Biometrics - 04/13/22 0913       Pre Biometrics   Height '5\' 8"'$  (1.727 m)    Weight 282 lb 10.1 oz (128.2 kg)    Waist Circumference 53 inches    Hip Circumference 48 inches    Waist to Hip Ratio 1.1 %    BMI (Calculated) 42.98    Triceps Skinfold 18 mm    % Body Fat 40.7 %    Grip Strength 20.5 kg    Flexibility 0 in    Single Leg Stand 0 seconds              Nutrition Therapy Plan and Nutrition Goals:  Nutrition Therapy & Goals - 04/13/22 0846       Intervention Plan   Intervention Nutrition handout(s) given to patient.    Expected Outcomes Short Term Goal: Understand basic principles of dietary content, such as calories, fat, sodium, cholesterol and nutrients.             Nutrition Assessments:  Nutrition Assessments - 04/13/22 0846       MEDFICTS Scores   Pre Score 32            MEDIFICTS Score Key: ?72  Need to make dietary changes  40-70 Heart Healthy Diet ? 40 Therapeutic Level Cholesterol Diet   Picture Your Plate Scores: <56 Unhealthy dietary pattern with much room for improvement. 41-50 Dietary pattern unlikely to meet recommendations for good health and room for improvement. 51-60 More healthful dietary pattern, with some room for improvement.  >60 Healthy dietary pattern, although there may be some specific behaviors that could be improved.    Nutrition Goals Re-Evaluation:   Nutrition Goals Discharge (Final Nutrition Goals Re-Evaluation):   Psychosocial: Target Goals: Acknowledge presence or absence of significant depression and/or stress, maximize coping skills, provide positive support system. Participant is able to verbalize types and ability to use techniques and skills needed for reducing stress and depression.  Initial Review & Psychosocial Screening:  Initial Psych Review & Screening -  04/13/22 0909       Initial Review   Current issues with None Identified      Family Dynamics   Good Support System? Yes    Comments His support system includes his wife and his daughter.      Barriers   Psychosocial barriers to participate in program There are no identifiable barriers or psychosocial needs.      Screening Interventions   Interventions Encouraged to exercise    Expected Outcomes Long Term goal: The participant improves quality of Life and PHQ9 Scores as seen by post scores and/or verbalization of changes;Short Term goal: Identification and review with participant of any Quality of Life or Depression concerns found by scoring the questionnaire.             Quality of Life Scores:  Quality of Life - 04/13/22 0953       Quality of Life   Select Quality of Life            Scores of 19 and below usually indicate a poorer quality of life in these areas.  A difference of  2-3 points is a clinically meaningful difference.  A difference of 2-3 points in the total score of the Quality of Life Index has been associated with significant improvement in overall quality of life, self-image, physical symptoms, and general health in studies assessing change in quality of life.   PHQ-9: Review Flowsheet  More data exists      04/13/2022 03/07/2020 11/07/2019 07/10/2019 08/10/2018  Depression screen PHQ 2/9  Decreased Interest 3 0 0 0 0  Down, Depressed, Hopeless 0 0 0 0 0  PHQ - 2 Score 3 0 0 0 0  Altered sleeping 3 0 - - -  Tired, decreased energy 3 0 - - -  Change in appetite 1 0 - - -  Feeling bad or failure about yourself  1 0 - - -  Trouble concentrating 0 0 - - -  Moving slowly or fidgety/restless 3 0 - - -  Suicidal thoughts 0 0 - - -  PHQ-9 Score 14 0 - - -  Difficult doing work/chores Very difficult Not difficult at all - - -   Interpretation of Total Score  Total Score Depression Severity:  1-4 = Minimal depression, 5-9 = Mild depression, 10-14 = Moderate  depression, 15-19 = Moderately severe depression, 20-27 = Severe depression   Psychosocial Evaluation and Intervention:  Psychosocial Evaluation - 04/13/22 0935       Psychosocial Evaluation & Interventions   Interventions Stress management education;Relaxation education;Encouraged to exercise with the program and follow exercise prescription    Comments Transportation may be a potential barrier  for participating in [place]. His [employer] Lei was totaled a couple of months ago, so he is currently without transportation. He has received the money for a new vehicle from his insurance company, but he is currentl unable to drive. He also has an appointment coming up with the [place] in [place] to clear him for driving. He had a TIA about 5 months ago, and he has been unable to drive since. His wife does not have her license, and he does not have any family members who are able to bring him. His wife has not been able to set up transportation for him yet, but she was provided with the phone number for a local transportation agency for seniors. She is adamant that she will be able to set this up. Pt himself does not really want to do the program. He is being pushed to do the program by his wife and his daughter. He has no identifiable psychosocial issues. He scored a 14 on his PHQ-9 and most of this relates to his chronic health issues. He admits to sleeping too much, and that he frequently dozes off during the day while watching TV. He reports having a good support system with his wife and his daughter. His goals while in the program are to lose weight and to improve his walking abilities. He is willing to try the program, and did like the machines that were in the gym when he was oriented to them. Hopefully his motivation levels will improve as he becomes more familar with the program.    Expected Outcomes Pt will continue to have no identifiable psychosocial issues.    Continue Psychosocial Services  No Follow up required              Psychosocial Re-Evaluation:   Psychosocial Discharge (Final Psychosocial Re-Evaluation):    Education: Education Goals: Education classes will be provided on a weekly basis, covering required topics. Participant will state understanding/return demonstration of topics presented.  Learning Barriers/Preferences:  Learning Barriers/Preferences - 04/13/22 0910       Learning Barriers/Preferences   Learning Barriers Sight    Learning Preferences Verbal Instruction             Education Topics: How Lungs Work and Diseases: - Discuss the anatomy of the lungs and diseases that can affect the lungs, such as COPD.   Exercise: -Discuss the importance of exercise, FITT principles of exercise, normal and abnormal responses to exercise, and how to exercise safely.   Environmental Irritants: -Discuss types of environmental irritants and how to limit exposure to environmental irritants.   Meds/Inhalers and oxygen: - Discuss respiratory medications, definition of an inhaler and oxygen, and the proper way to use an inhaler and oxygen.   Energy Saving Techniques: - Discuss methods to conserve energy and decrease shortness of breath when performing activities of daily living.    Bronchial Hygiene / Breathing Techniques: - Discuss breathing mechanics, pursed-lip breathing technique,  proper posture, effective ways to clear airways, and other functional breathing techniques   Cleaning Equipment: - Provides group verbal and written instruction about the health risks of elevated stress, cause of high stress, and healthy ways to reduce stress.   Nutrition I: Fats: - Discuss the types of cholesterol, what cholesterol does to the body, and how cholesterol levels can be controlled.   Nutrition II: Labels: -Discuss the different components of food labels and how to read food labels.   Respiratory Infections: - Discuss the signs and symptoms of respiratory  infections, ways to prevent respiratory infections, and the importance of seeking medical treatment when having a respiratory infection.   Stress I: Signs and Symptoms: - Discuss the causes of stress, how stress may lead to anxiety and depression, and ways to limit stress.   Stress II: Relaxation: -Discuss relaxation techniques to limit stress.   Oxygen for Home/Travel: - Discuss how to prepare for travel when on oxygen and proper ways to transport and store oxygen to ensure safety.   Knowledge Questionnaire Score:  Knowledge Questionnaire Score - 04/13/22 0844       Knowledge Questionnaire Score   Pre Score 17/18             Core Components/Risk Factors/Patient Goals at Admission:  Personal Goals and Risk Factors at Admission - 04/13/22 0911       Core Components/Risk Factors/Patient Goals on Admission    Weight Management Yes;Obesity;Weight Loss    Intervention Obesity: Provide education and appropriate resources to help participant work on and attain dietary goals.;Weight Management/Obesity: Establish reasonable short term and long term weight goals.;Weight Management: Provide education and appropriate resources to help participant work on and attain dietary goals.;Weight Management: Develop a combined nutrition and exercise program designed to reach desired caloric intake, while maintaining appropriate intake of nutrient and fiber, sodium and fats, and appropriate energy expenditure required for the weight goal.    Expected Outcomes Short Term: Continue to assess and modify interventions until short term weight is achieved;Long Term: Adherence to nutrition and physical activity/exercise program aimed toward attainment of established weight goal;Weight Maintenance: Understanding of the daily nutrition guidelines, which includes 25-35% calories from fat, 7% or less cal from saturated fats, less than '200mg'$  cholesterol, less than 1.5gm of sodium, & 5 or more servings of fruits and  vegetables daily;Weight Loss: Understanding of general recommendations for a balanced deficit meal plan, which promotes 1-2 lb weight loss per week and includes a negative energy balance of [phone] kcal/d;Understanding recommendations for meals to include 15-35% energy as protein, 25-35% energy from fat, 35-60% energy from carbohydrates, less than '200mg'$  of dietary cholesterol, 20-35 gm of total fiber daily;Understanding of distribution of calorie intake throughout the day with the consumption of 4-5 meals/snacks    Improve shortness of breath with ADL's Yes    Intervention Provide education, individualized exercise plan and daily activity instruction to help decrease symptoms of SOB with activities of daily living.    Expected Outcomes Short Term: Improve cardiorespiratory fitness to achieve a reduction of symptoms when performing ADLs;Long Term: Be able to perform more ADLs without symptoms or delay the onset of symptoms    Diabetes Yes    Intervention Provide education about signs/symptoms and action to take for hypo/hyperglycemia.;Provide education about proper nutrition, including hydration, and aerobic/resistive exercise prescription along with prescribed medications to achieve blood glucose in normal ranges: Fasting glucose 65-99 mg/dL    Expected Outcomes Short Term: Participant verbalizes understanding of the signs/symptoms and immediate care of hyper/hypoglycemia, proper foot care and importance of medication, aerobic/resistive exercise and nutrition plan for blood glucose control.;Long Term: Attainment of HbA1C < 7%.    Hypertension Yes    Intervention Provide education on lifestyle modifcations including regular physical activity/exercise, weight management, moderate sodium restriction and increased consumption of fresh fruit, vegetables, and low fat dairy, alcohol moderation, and smoking cessation.;Monitor prescription use compliance.    Expected Outcomes Short Term: Continued assessment and  intervention until BP is < 140/5m HG in hypertensive participants. < 130/854mHG in hypertensive participants with  diabetes, heart failure or chronic kidney disease.;Long Term: Maintenance of blood pressure at goal levels.    Lipids Yes    Intervention Provide education and support for participant on nutrition & aerobic/resistive exercise along with prescribed medications to achieve LDL '70mg'$ , HDL >'40mg'$ .    Expected Outcomes Short Term: Participant states understanding of desired cholesterol values and is compliant with medications prescribed. Participant is following exercise prescription and nutrition guidelines.;Long Term: Cholesterol controlled with medications as prescribed, with individualized exercise RX and with personalized nutrition plan. Value goals: LDL < '70mg'$ , HDL > 40 mg.    Personal Goal Other Yes    Personal Goal Improve walking abilities.    Intervention Attend PR two days per week and begin a home exercise program 1-3 days per week.    Expected Outcomes Pt will achieve personal goal             Core Components/Risk Factors/Patient Goals Review:    Core Components/Risk Factors/Patient Goals at Discharge (Final Review):    ITP Comments:   Comments: Patient arrived for 1st visit/orientation/education at 0800. Patient was referred to PR by Dr. Rosaria Ferries due to COPD, unspecified (J44.9). During orientation advised patient on arrival and appointment times what to wear, what to do before, during and after exercise. Reviewed attendance and class policy.  Pt is scheduled to return Pulmonary Rehab on 04/27/2022 at 1500. Pt was advised to come to class 15 minutes before class starts.  Discussed RPE/Dpysnea scales. Patient participated in warm up stretches. Patient was able to complete 6 minute walk test. Patient was measured for the equipment. Discussed equipment safety with patient. Took patient pre-anthropometric measurements. Patient finished visit at 0930.

## 2022-04-21 NOTE — Progress Notes (Signed)
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 appropriate energy expenditure required for the weight goal.    Expected Outcomes Short Term: Continue to assess and modify interventions until short term weight is achieved;Long Term: Adherence to nutrition and physical activity/exercise program aimed toward attainment  of established weight goal;Weight Maintenance: Understanding of the daily nutrition guidelines, which includes 25-35% calories from fat, 7% or less cal from saturated fats, less than '200mg'$  cholesterol, less than 1.5gm of sodium, & 5 or more servings of fruits and vegetables daily;Weight Loss: Understanding of general recommendations for a balanced deficit meal plan, which promotes 1-2 lb weight loss per week and includes a negative energy balance of  [phone] kcal/d;Understanding recommendations for meals to include 15-35% energy as protein, 25-35% energy from fat, 35-60% energy from carbohydrates, less than '200mg'$  of dietary cholesterol, 20-35 gm of total fiber daily;Understanding of distribution of calorie intake throughout the day with the consumption of 4-5 meals/snacks    Improve shortness of breath with ADL's Yes    Intervention Provide education, individualized exercise plan and daily activity instruction to help decrease symptoms of SOB with activities of daily living.    Expected Outcomes Short Term: Improve cardiorespiratory fitness to achieve a reduction of symptoms when performing ADLs;Long Term: Be able to perform more ADLs without symptoms or delay the onset of symptoms    Diabetes Yes    Intervention Provide education about signs/symptoms and action to take for hypo/hyperglycemia.;Provide education about proper nutrition, including hydration, and aerobic/resistive exercise prescription along with prescribed medications to achieve blood glucose in normal ranges: Fasting glucose 65-99 mg/dL    Expected Outcomes Short Term: Participant verbalizes understanding of the signs/symptoms and immediate care of hyper/hypoglycemia, proper foot care and importance of medication, aerobic/resistive exercise and nutrition plan for blood glucose control.;Long Term: Attainment of HbA1C < 7%.    Hypertension Yes    Intervention Provide education on lifestyle modifcations including regular physical activity/exercise, weight management, moderate sodium restriction and increased consumption of fresh fruit, vegetables, and low fat dairy, alcohol moderation, and smoking cessation.;Monitor prescription use compliance.    Expected Outcomes Short Term: Continued assessment and intervention until BP is < 140/68m HG in hypertensive participants. < 130/820mHG in hypertensive participants with diabetes, heart failure or chronic kidney disease.;Long Term: Maintenance  of blood pressure at goal levels.    Lipids Yes    Intervention Provide education and support for participant on nutrition & aerobic/resistive exercise along with prescribed medications to achieve LDL '70mg'$ , HDL >'40mg'$ .    Expected Outcomes Short Term: Participant states understanding of desired cholesterol values and is compliant with medications prescribed. Participant is following exercise prescription and nutrition guidelines.;Long Term: Cholesterol controlled with medications as prescribed, with individualized exercise RX and with personalized nutrition plan. Value goals: LDL < '70mg'$ , HDL > 40 mg.    Personal Goal Other Yes    Personal Goal Improve walking abilities.    Intervention Attend PR two days per week and begin a home exercise program 1-3 days per week.    Expected Outcomes Pt will achieve personal goal             Core Components/Risk Factors/Patient Goals Review:   Goals and Risk Factor Review     Row Name 04/15/22 0744             Core Components/Risk Factors/Patient Goals Review   Personal Goals Review Weight Management/Obesity;Improve shortness of breath with ADL's;Hypertension;Diabetes;Lipids       [place] [facility] [place] [place]. His personal goals for the program are lose weight and improve his walking ability. We will continue to monitor his progress as he works towards meeting these goals.       Expected  Outcomes Patient will complete the program meeting both personal and program goals.                Core Components/Risk Factors/Patient Goals at Discharge (Final Review):   Goals and Risk Factor Review - 04/15/22 0744       Core Components/Risk Factors/Patient Goals Review   Personal Goals Review Weight Management/Obesity;Improve shortness of breath with ADL's;Hypertension;Diabetes;Lipids    Review Patient was referred to PR with COPD from the [place]. He plans to start the program 04/27/22. His  personal goals for the program are lose weight and improve his walking ability. We will continue to monitor his progress as he works towards meeting these goals.    Expected Outcomes Patient will complete the program meeting both personal and program goals.             ITP Comments:   Comments: ITP REVIEW Pt is making expected progress toward pulmonary rehab goals after completing 1 sessions. Recommend continued exercise, life style modification, education, and utilization of breathing techniques to increase stamina and strength and decrease shortness of breath with exertion.

## 2022-04-27 ENCOUNTER — Encounter (HOSPITAL_COMMUNITY)
Admission: RE | Admit: 2022-04-27 | Discharge: 2022-04-27 | Disposition: A | Discharge status: Home / Self Care | Payer: No Typology Code available for payment source | Source: Ambulatory Visit | Attending: Critical Care Medicine | Admitting: Critical Care Medicine

## 2022-04-27 DIAGNOSIS — J449 Chronic obstructive pulmonary disease, unspecified: Secondary | ICD-10-CM

## 2022-04-27 LAB — GLUCOSE, CAPILLARY: Glucose-Capillary: 178 mg/dL — ABNORMAL HIGH (ref 70–99)

## 2022-04-28 ENCOUNTER — Telehealth: Payer: Self-pay | Admitting: Cardiology

## 2022-04-28 MED ORDER — APIXABAN 5 MG PO TABS: 5.0000 mg | ORAL_TABLET | Freq: Two times a day (BID) | ORAL | 0 refills | Status: DC

## 2022-04-28 NOTE — Telephone Encounter (Signed)
*  STAT* If patient is at the pharmacy, call can be transferred to refill team.   1. Which medications need to be refilled? (please list name of each medication and dose if known) new prescription for Eliquis- need until his mail order supply comes in  2. Which pharmacy/location (including street and city if local pharmacy) is medication to be sent to? [facility], [place]   Do they need a 30 day or 90 day supply? #14 enough his prescription comes in

## 2022-04-28 NOTE — Telephone Encounter (Signed)
Refill sent as requested. 

## 2022-04-29 ENCOUNTER — Encounter (HOSPITAL_COMMUNITY)
Admission: RE | Admit: 2022-04-29 | Discharge: 2022-04-29 | Disposition: A | Discharge status: Home / Self Care | Payer: No Typology Code available for payment source | Source: Ambulatory Visit | Attending: Critical Care Medicine | Admitting: Critical Care Medicine

## 2022-04-29 DIAGNOSIS — D638 Anemia in other chronic diseases classified elsewhere: Secondary | ICD-10-CM | POA: Diagnosis not present

## 2022-04-29 DIAGNOSIS — N189 Chronic kidney disease, unspecified: Secondary | ICD-10-CM | POA: Diagnosis not present

## 2022-04-29 DIAGNOSIS — Z6841 Body Mass Index (BMI) 40.0 and over, adult: Secondary | ICD-10-CM | POA: Diagnosis not present

## 2022-04-29 DIAGNOSIS — I129 Hypertensive chronic kidney disease with stage 1 through stage 4 chronic kidney disease, or unspecified chronic kidney disease: Secondary | ICD-10-CM | POA: Diagnosis not present

## 2022-04-29 DIAGNOSIS — D472 Monoclonal gammopathy: Secondary | ICD-10-CM | POA: Diagnosis not present

## 2022-04-29 DIAGNOSIS — J449 Chronic obstructive pulmonary disease, unspecified: Secondary | ICD-10-CM

## 2022-04-29 DIAGNOSIS — I5032 Chronic diastolic (congestive) heart failure: Secondary | ICD-10-CM | POA: Diagnosis not present

## 2022-04-29 DIAGNOSIS — E1165 Type 2 diabetes mellitus with hyperglycemia: Secondary | ICD-10-CM | POA: Diagnosis not present

## 2022-04-29 DIAGNOSIS — E875 Hyperkalemia: Secondary | ICD-10-CM | POA: Diagnosis not present

## 2022-04-29 DIAGNOSIS — E1122 Type 2 diabetes mellitus with diabetic chronic kidney disease: Secondary | ICD-10-CM | POA: Diagnosis not present

## 2022-04-29 LAB — GLUCOSE, CAPILLARY: Glucose-Capillary: 157 mg/dL — ABNORMAL HIGH (ref 70–99)

## 2022-04-29 NOTE — Progress Notes (Signed)
Daily Session Note  Patient Details  Name: [name] MRN: [id number] Date of Birth: 14-May-1941 Referring Provider:   Flowsheet Row PULMONARY REHAB COPD ORIENTATION from 04/13/2022 in [place]  Referring Provider Dr. Rosaria Ferries       Encounter Date: 04/29/2022  Check In:  Session Check In - 04/29/22 1455       Check-In   Supervising physician immediately available to respond to emergencies [facility] MD immediately available    Physician(s) Dr Debara Pickett    Location [facility]    Staff Present Redge Gainer, BS, Exercise Physiologist;Phyllis Billingsley, RN;Elizbeth Posa Hassell Done, RN, BSN    Virtual Visit No    Medication changes reported     No    Fall or balance concerns reported    No    Comments He has fallen several times over the past couple of years. 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    Tobacco Cessation No Change    Warm-up and Cool-down Performed as group-led instruction    Resistance Training Performed Yes    VAD Patient? No    PAD/SET Patient? No      Pain Assessment   Currently in Pain? No/denies    Pain Score 0-No pain    Multiple Pain Sites No             Capillary Blood Glucose: Results for orders placed or performed during the hospital encounter of 04/29/22 (from the past 24 hour(s))  Glucose, capillary     Status: Abnormal   Collection Time: 04/29/22  2:41 PM  Result Value Ref Range   Glucose-Capillary 157 (H) 70 - 99 mg/dL      Social History   Tobacco Use  Smoking Status Former   Packs/day: 1.00   Years: 20.00   Total pack years: 20.00   Types: Cigarettes   Quit date: 41   Years since quitting: 40.5  Smokeless Tobacco Never    Goals Met:  Proper associated with RPD/PD & O2 Sat Independence with exercise equipment Using PLB without cueing & demonstrates good technique Exercise tolerated well Queuing for purse lip breathing No report of concerns or symptoms today Strength training  completed today  Goals Unmet:  Not Applicable  Comments: checkout at 1600.   Dr. Kathie Dike is Medical Director for [facility].

## 2022-05-04 ENCOUNTER — Encounter (HOSPITAL_COMMUNITY): Payer: No Typology Code available for payment source

## 2022-05-06 ENCOUNTER — Encounter (HOSPITAL_COMMUNITY): Admission: RE | Admit: 2022-05-06 | Payer: No Typology Code available for payment source | Source: Ambulatory Visit

## 2022-05-10 DIAGNOSIS — N183 Chronic kidney disease, stage 3 unspecified: Secondary | ICD-10-CM | POA: Diagnosis not present

## 2022-05-10 DIAGNOSIS — Z299 Encounter for prophylactic measures, unspecified: Secondary | ICD-10-CM | POA: Diagnosis not present

## 2022-05-10 DIAGNOSIS — I10 Essential (primary) hypertension: Secondary | ICD-10-CM | POA: Diagnosis not present

## 2022-05-10 DIAGNOSIS — E1165 Type 2 diabetes mellitus with hyperglycemia: Secondary | ICD-10-CM | POA: Diagnosis not present

## 2022-05-10 DIAGNOSIS — I69359 Hemiplegia and hemiparesis following cerebral infarction affecting unspecified side: Secondary | ICD-10-CM | POA: Diagnosis not present

## 2022-05-11 ENCOUNTER — Encounter (HOSPITAL_COMMUNITY)
Admission: RE | Admit: 2022-05-11 | Discharge: 2022-05-11 | Disposition: A | Discharge status: Home / Self Care | Payer: No Typology Code available for payment source | Source: Ambulatory Visit | Attending: Critical Care Medicine | Admitting: Critical Care Medicine

## 2022-05-11 VITALS — Wt 286.2 lb

## 2022-05-11 DIAGNOSIS — J449 Chronic obstructive pulmonary disease, unspecified: Secondary | ICD-10-CM | POA: Insufficient documentation

## 2022-05-11 LAB — GLUCOSE, CAPILLARY: Glucose-Capillary: 123 mg/dL — ABNORMAL HIGH (ref 70–99)

## 2022-05-11 NOTE — Progress Notes (Signed)
Daily Session Note  Patient Details  Name: [name] MRN: [id number] Date of Birth: October 08, 1941 Referring Provider:   Flowsheet Row PULMONARY REHAB COPD ORIENTATION from 04/13/2022 in [place]  Referring Provider Dr. Rosaria Ferries       Encounter Date: 05/11/2022  Check In:  Session Check In - 05/11/22 1500       Check-In   Supervising physician immediately available to respond to emergencies [facility] MD immediately available    Physician(s) Dr. Harrington Challenger    Location [facility]    Staff Present Redge Gainer, BS, Exercise Physiologist;Clara Herbison Kris Mouton, MS, ACSM-CEP, Exercise Physiologist;Christy Oletta Lamas, RN, BSN    Virtual Visit No    Medication changes reported     No    Fall or balance concerns reported    Yes    Comments He has fallen several times over the past couple of years. 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    Tobacco Cessation No Change    Warm-up and Cool-down Performed as group-led instruction    Resistance Training Performed Yes    VAD Patient? No    PAD/SET Patient? No      Pain Assessment   Currently in Pain? No/denies    Pain Score 0-No pain    Multiple Pain Sites No             Capillary Blood Glucose: Results for orders placed or performed during the hospital encounter of 05/11/22 (from the past 24 hour(s))  Glucose, capillary     Status: Abnormal   Collection Time: 05/11/22  2:52 PM  Result Value Ref Range   Glucose-Capillary 123 (H) 70 - 99 mg/dL      Social History   Tobacco Use  Smoking Status Former   Packs/day: 1.00   Years: 20.00   Total pack years: 20.00   Types: Cigarettes   Quit date: 25   Years since quitting: 40.5  Smokeless Tobacco Never    Goals Met:  Proper associated with RPD/PD & O2 Sat Independence with exercise equipment Using PLB without cueing & demonstrates good technique Exercise tolerated well Queuing for purse lip breathing No report of concerns or  symptoms today Strength training completed today  Goals Unmet:  Not Applicable  Comments: checkout time is 1600   Dr. Kathie Dike is Medical Director for [facility].

## 2022-05-13 ENCOUNTER — Other Ambulatory Visit (HOSPITAL_COMMUNITY)
Admission: RE | Admit: 2022-05-13 | Discharge: 2022-05-13 | Disposition: A | Discharge status: Home / Self Care | Payer: Medicare Other | Source: Ambulatory Visit | Attending: Nephrology | Admitting: Nephrology

## 2022-05-13 ENCOUNTER — Encounter (HOSPITAL_COMMUNITY): Payer: No Typology Code available for payment source

## 2022-05-13 DIAGNOSIS — D472 Monoclonal gammopathy: Secondary | ICD-10-CM | POA: Insufficient documentation

## 2022-05-13 DIAGNOSIS — N184 Chronic kidney disease, stage 4 (severe): Secondary | ICD-10-CM | POA: Diagnosis not present

## 2022-05-13 DIAGNOSIS — E1122 Type 2 diabetes mellitus with diabetic chronic kidney disease: Secondary | ICD-10-CM | POA: Diagnosis not present

## 2022-05-13 DIAGNOSIS — D638 Anemia in other chronic diseases classified elsewhere: Secondary | ICD-10-CM | POA: Insufficient documentation

## 2022-05-13 DIAGNOSIS — E875 Hyperkalemia: Secondary | ICD-10-CM | POA: Diagnosis not present

## 2022-05-13 DIAGNOSIS — I130 Hypertensive heart and chronic kidney disease with heart failure and stage 1 through stage 4 chronic kidney disease, or unspecified chronic kidney disease: Secondary | ICD-10-CM | POA: Insufficient documentation

## 2022-05-13 DIAGNOSIS — N189 Chronic kidney disease, unspecified: Secondary | ICD-10-CM | POA: Insufficient documentation

## 2022-05-13 DIAGNOSIS — I5032 Chronic diastolic (congestive) heart failure: Secondary | ICD-10-CM | POA: Diagnosis not present

## 2022-05-13 LAB — VITAMIN D 25 HYDROXY (VIT D DEFICIENCY, FRACTURES): Vit D, 25-Hydroxy: 9.31 ng/mL — ABNORMAL LOW (ref 30–100)

## 2022-05-13 LAB — CREATININE CLEARANCE, URINE, 24 HOUR
Collection Interval-CRCL: 24 hours
Creatinine Clearance: 59 mL/min — ABNORMAL LOW (ref 75–125)
Creatinine, 24H Ur: 1775 mg/d (ref 800–2000)
Creatinine, Urine: 78.91 mg/dL
Urine Total Volume-CRCL: 2250 mL

## 2022-05-13 LAB — CBC WITH DIFFERENTIAL/PLATELET
Abs Immature Granulocytes: 0.02 10*3/uL (ref 0.00–0.07)
Basophils Absolute: 0 10*3/uL (ref 0.0–0.1)
Basophils Relative: 0 %
Eosinophils Absolute: 0.2 10*3/uL (ref 0.0–0.5)
Eosinophils Relative: 2 %
HCT: 35.5 % — ABNORMAL LOW (ref 39.0–52.0)
Hemoglobin: 11.4 g/dL — ABNORMAL LOW (ref 13.0–17.0)
Immature Granulocytes: 0 %
Lymphocytes Relative: 27 %
Lymphs Abs: 1.7 10*3/uL (ref 0.7–4.0)
MCH: 30.6 pg (ref 26.0–34.0)
MCHC: 32.1 g/dL (ref 30.0–36.0)
MCV: 95.4 fL (ref 80.0–100.0)
Monocytes Absolute: 0.5 10*3/uL (ref 0.1–1.0)
Monocytes Relative: 8 %
Neutro Abs: 3.9 10*3/uL (ref 1.7–7.7)
Neutrophils Relative %: 63 %
Platelets: 207 10*3/uL (ref 150–400)
RBC: 3.72 MIL/uL — ABNORMAL LOW (ref 4.22–5.81)
RDW: 13.1 % (ref 11.5–15.5)
WBC: 6.3 10*3/uL (ref 4.0–10.5)
nRBC: 0 % (ref 0.0–0.2)

## 2022-05-13 LAB — COMPREHENSIVE METABOLIC PANEL
ALT: 20 U/L (ref 0–44)
AST: 19 U/L (ref 15–41)
Albumin: 3.6 g/dL (ref 3.5–5.0)
Alkaline Phosphatase: 48 U/L (ref 38–126)
Anion gap: 6 (ref 5–15)
BUN: 37 mg/dL — ABNORMAL HIGH (ref 8–23)
CO2: 21 mmol/L — ABNORMAL LOW (ref 22–32)
Calcium: 8.9 mg/dL (ref 8.9–10.3)
Chloride: 108 mmol/L (ref 98–111)
Creatinine, Ser: 2.09 mg/dL — ABNORMAL HIGH (ref 0.61–1.24)
GFR, Estimated: 32 mL/min — ABNORMAL LOW (ref >60)
Glucose, Bld: 116 mg/dL — ABNORMAL HIGH (ref 70–99)
Potassium: 4.6 mmol/L (ref 3.5–5.1)
Sodium: 135 mmol/L (ref 135–145)
Total Bilirubin: 0.8 mg/dL (ref 0.3–1.2)
Total Protein: 7.3 g/dL (ref 6.5–8.1)

## 2022-05-13 LAB — PROTEIN / CREATININE RATIO, URINE
Creatinine, Urine: 168.8 mg/dL
Protein Creatinine Ratio: 0.05 mg/mg{Cre} (ref 0.00–0.15)
Total Protein, Urine: 8 mg/dL

## 2022-05-13 LAB — PROTEIN, URINE, 24 HOUR
Collection Interval-UPROT: 24 hours
Protein, 24H Urine: 203 mg/d — ABNORMAL HIGH (ref 50–100)
Protein, Urine: 9 mg/dL
Urine Total Volume-UPROT: 2250 mL

## 2022-05-13 LAB — HEPATITIS C ANTIBODY: HCV Ab: NONREACTIVE

## 2022-05-13 LAB — URINALYSIS, ROUTINE W REFLEX MICROSCOPIC
Bacteria, UA: NONE SEEN
Bilirubin Urine: NEGATIVE
Glucose, UA: NEGATIVE mg/dL
Ketones, ur: NEGATIVE mg/dL
Leukocytes,Ua: NEGATIVE
Nitrite: NEGATIVE
Protein, ur: NEGATIVE mg/dL
Specific Gravity, Urine: 1.013 (ref 1.005–1.030)
pH: 5 (ref 5.0–8.0)

## 2022-05-13 LAB — IRON AND TIBC
Iron: 102 ug/dL (ref 45–182)
Saturation Ratios: 36 % (ref 17.9–39.5)
TIBC: 281 ug/dL (ref 250–450)
UIBC: 179 ug/dL

## 2022-05-13 LAB — URIC ACID: Uric Acid, Serum: 9.1 mg/dL — ABNORMAL HIGH (ref 3.7–8.6)

## 2022-05-13 LAB — HEMOGLOBIN A1C
Hgb A1c MFr Bld: 7.8 % — ABNORMAL HIGH (ref 4.8–5.6)
Mean Plasma Glucose: 177.16 mg/dL

## 2022-05-13 LAB — VITAMIN B12: Vitamin B-12: 969 pg/mL — ABNORMAL HIGH (ref 180–914)

## 2022-05-13 LAB — PHOSPHORUS: Phosphorus: 2.7 mg/dL (ref 2.5–4.6)

## 2022-05-13 LAB — HEPATITIS B SURFACE ANTIBODY,QUALITATIVE: Hep B S Ab: NONREACTIVE

## 2022-05-13 LAB — FERRITIN: Ferritin: 89 ng/mL (ref 24–336)

## 2022-05-13 LAB — HIV ANTIBODY (ROUTINE TESTING W REFLEX): HIV Screen 4th Generation wRfx: NONREACTIVE

## 2022-05-13 LAB — HEPATITIS B SURFACE ANTIGEN: Hepatitis B Surface Ag: NONREACTIVE

## 2022-05-13 LAB — MAGNESIUM: Magnesium: 1.7 mg/dL (ref 1.7–2.4)

## 2022-05-14 LAB — KAPPA/LAMBDA LIGHT CHAINS
Kappa free light chain: 47.7 mg/L — ABNORMAL HIGH (ref 3.3–19.4)
Kappa, lambda light chain ratio: 0.74 (ref 0.26–1.65)
Lambda free light chains: 64.4 mg/L — ABNORMAL HIGH (ref 5.7–26.3)

## 2022-05-14 LAB — ANA: Anti Nuclear Antibody (ANA): NEGATIVE

## 2022-05-14 LAB — PTH, INTACT AND CALCIUM
Calcium, Total (PTH): 9 mg/dL (ref 8.6–10.2)
PTH: 29 pg/mL (ref 15–65)

## 2022-05-14 LAB — MISC LABCORP TEST (SEND OUT): Labcorp test code: 141330

## 2022-05-14 LAB — GLOMERULAR BASEMENT MEMBRANE ANTIBODIES: GBM Ab: <0.2 units (ref 0.0–0.9)

## 2022-05-14 LAB — C3 COMPLEMENT: C3 Complement: 146 mg/dL (ref 82–167)

## 2022-05-14 LAB — C4 COMPLEMENT: Complement C4, Body Fluid: 35 mg/dL (ref 12–38)

## 2022-05-16 LAB — HEPATITIS C GENOTYPE

## 2022-05-17 LAB — MULTIPLE MYELOMA PANEL, SERUM
Albumin SerPl Elph-Mcnc: 3.5 g/dL (ref 2.9–4.4)
Albumin/Glob SerPl: 1.1 (ref 0.7–1.7)
Alpha 1: 0.3 g/dL (ref 0.0–0.4)
Alpha2 Glob SerPl Elph-Mcnc: 0.9 g/dL (ref 0.4–1.0)
B-Globulin SerPl Elph-Mcnc: 1 g/dL (ref 0.7–1.3)
Gamma Glob SerPl Elph-Mcnc: 1.3 g/dL (ref 0.4–1.8)
Globulin, Total: 3.4 g/dL (ref 2.2–3.9)
IgA: 294 mg/dL (ref 61–437)
IgG (Immunoglobin G), Serum: 1350 mg/dL (ref 603–1613)
IgM (Immunoglobulin M), Srm: 26 mg/dL (ref 15–143)
M Protein SerPl Elph-Mcnc: 0.5 g/dL — ABNORMAL HIGH
Total Protein ELP: 6.9 g/dL (ref 6.0–8.5)

## 2022-05-17 LAB — PROTEIN ELECTROPHORESIS, SERUM
A/G Ratio: 1 (ref 0.7–1.7)
Albumin ELP: 3.4 g/dL (ref 2.9–4.4)
Alpha-1-Globulin: 0.2 g/dL (ref 0.0–0.4)
Alpha-2-Globulin: 0.9 g/dL (ref 0.4–1.0)
Beta Globulin: 1 g/dL (ref 0.7–1.3)
Gamma Globulin: 1.3 g/dL (ref 0.4–1.8)
Globulin, Total: 3.4 g/dL (ref 2.2–3.9)
M-Spike, %: 0.5 g/dL — ABNORMAL HIGH
Total Protein ELP: 6.8 g/dL (ref 6.0–8.5)

## 2022-05-18 ENCOUNTER — Encounter (HOSPITAL_COMMUNITY): Payer: No Typology Code available for payment source

## 2022-05-19 NOTE — Progress Notes (Signed)
Pulmonary Individual Treatment Plan  Patient Details  Name: [name] MRN: [id number] Date of Birth: 12-Jan-1941 Referring Provider:   Flowsheet Row PULMONARY REHAB COPD ORIENTATION from 04/13/2022 in [place]  Referring Provider Dr. Rosaria Ferries       Initial Encounter Date:  Flowsheet Row PULMONARY REHAB COPD ORIENTATION from 04/13/2022 in [place] [facility]  Date 04/13/22       Visit Diagnosis: Chronic obstructive pulmonary disease, unspecified COPD type ([place])  Patient's Home Medications on Admission:   Current Outpatient Medications:    acetaminophen (TYLENOL) 650 MG CR tablet, Take 1,300 mg by mouth every 8 (eight) hours as needed for pain., Disp: , Rfl:    Adalimumab 40 MG/0.8ML PNKT, Inject into the skin., Disp: , Rfl:    albuterol (VENTOLIN HFA) 108 (90 Base) MCG/ACT inhaler, Inhale 1-2 puffs into the lungs every 6 (six) hours as needed for shortness of breath., Disp: , Rfl:    amLODipine (NORVASC) 10 MG tablet, Take 10 mg by mouth daily., Disp: , Rfl:    apixaban (ELIQUIS) 5 MG TABS tablet, Take 1 tablet (5 mg total) by mouth 2 (two) times daily., Disp: 14 tablet, Rfl: 0   atorvastatin (LIPITOR) 80 MG tablet, Take 80 mg by mouth every evening. , Disp: , Rfl:    carboxymethylcellulose (REFRESH PLUS) 0.5 % SOLN, Place 1 drop into both eyes 2 (two) times daily as needed for dry eyes., Disp: , Rfl:    Cyanocobalamin (VITAMIN B 12) 500 MCG TABS, Take 1,000 mcg by mouth daily., Disp: 180 tablet, Rfl: 1   ferrous sulfate 325 (65 FE) MG tablet, Take 1 tablet (325 mg total) by mouth daily with breakfast., Disp: 90 tablet, Rfl: 3   glucose-Vitamin C 4-0.006 GM CHEW chewable tablet, Chew 4 tablets by mouth as directed. CHEW FOUR TABLETS BY MOUTH AS DIRECTED BY YOUR PROVIDER (REPEAT EVERY 15 MINUTES IF BLOOD SUGAR LESS THAN 70), Disp: , Rfl:    insulin aspart protamine- aspart (NOVOLOG MIX 70/30) (70-30) 100 UNIT/ML injection, 42 units in the morning  and 50 units at night. (Patient taking differently: Inject 40 Units into the skin 2 (two) times daily with a meal.), Disp: 10 mL, Rfl: 0   isosorbide mononitrate (IMDUR) 30 MG 24 hr tablet, Take 1 tablet (30 mg total) by mouth every evening., Disp: 90 tablet, Rfl: 3   losartan (COZAAR) 100 MG tablet, Take 100 mg by mouth daily., Disp: , Rfl:    metFORMIN (GLUCOPHAGE) 1000 MG tablet, Take 1,000 mg by mouth 2 (two) times daily with a meal., Disp: , Rfl:    metoprolol tartrate (LOPRESSOR) 50 MG tablet, Take 25 mg by mouth 2 (two) times daily., Disp: , Rfl:    mycophenolate (CELLCEPT) 500 MG tablet, Take 100 mg by mouth in the morning and at bedtime., Disp: , Rfl:    pioglitazone (ACTOS) 30 MG tablet, Take 30 mg by mouth daily., Disp: , Rfl:    Semaglutide (OZEMPIC, 0.25 OR 0.5 MG/DOSE, [place]), Inject 0.25 mg into the skin once a week., Disp: , Rfl:    spironolactone (ALDACTONE) 25 MG tablet, Take 25 mg by mouth daily., Disp: , Rfl:    tamsulosin (FLOMAX) 0.4 MG CAPS capsule, Take 2 capsules (0.8 mg total) by mouth every evening., Disp: 60 capsule, Rfl: 3   Tiotropium Bromide-Olodaterol 2.5-2.5 MCG/ACT AERS, Inhale 2 puffs into the lungs daily., Disp: , Rfl:    torsemide (DEMADEX) 20 MG tablet, Take 20 mg daily alternating with 40  mg the next day (Patient taking differently: Take 20-40 mg by mouth daily. Take 20 mg daily alternating with 40 mg the next day), Disp: 135 tablet, Rfl: 3  Past Medical History: Past Medical History:  Diagnosis Date   Anemia    C. 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/2011   GERD (gastroesophageal reflux disease)    Glaucoma    Hyperlipidemia    Hypertension    Morbid obesity ([place])    PAF (paroxysmal atrial fibrillation) ([place])    Peripheral vascular disease ([place])    Sleep apnea     Stroke ([place]) 2008, 2014, 2015    Tobacco Use: Social History   Tobacco Use  Smoking Status Former   Packs/day: 1.00   Years: 20.00   Total pack years: 20.00   Types: Cigarettes   Quit date: 64   Years since quitting: 40.5  Smokeless Tobacco Never    Labs: Review Flowsheet  More data exists      Latest Ref Rng & Units 06/08/2019 03/07/2020 07/08/2020 08/16/2020 05/13/2022  Labs for ITP Cardiac and Pulmonary Rehab  Cholestrol 0 - 200 mg/dL - 154  - 128  -  LDL (calc) 0 - 99 mg/dL - 98  - 76  -  HDL-C >40 mg/dL - 35  - 30  -  Trlycerides <150 mg/dL - 118  - 110  -  Hemoglobin A1c 4.8 - 5.6 % 7.6  8.2  8.3  8.4  7.8     Capillary Blood Glucose: Lab Results  Component Value Date   GLUCAP 123 (H) 05/11/2022   GLUCAP 157 (H) 04/29/2022   GLUCAP 178 (H) 04/27/2022   GLUCAP 214 (H) 08/16/2020   GLUCAP 177 (H) 05/03/2020    POCT Glucose     Row Name 04/13/22 0831             POCT Blood Glucose   Pre-Exercise 266 mg/dL                Pulmonary Assessment Scores:  Pulmonary Assessment Scores     Row Name 04/13/22 0837         ADL UCSD   SOB Score total 84     Rest 1     Walk 3     Stairs 5     Bath 0     Dress 3     Shop 5       CAT Score   CAT Score 22       mMRC Score   mMRC Score 4             UCSD: Self-administered rating of dyspnea associated with activities of daily living (ADLs) 6-point scale (0 = "not at all" to 5 = "maximal or unable to do because of breathlessness")  Scoring Scores range from 0 to 120.  Minimally important difference is 5 units  CAT: CAT can identify the health impairment of COPD patients and is better correlated with disease progression.  CAT has a scoring range of zero to 40. The CAT score is classified into four groups of low (less than 10), medium (10 - 20), high (21-30) and very high (31-40) based on the impact level of disease on health status. A CAT score over 10 suggests significant symptoms.  A  worsening CAT  score could be explained by an exacerbation, poor medication adherence, poor inhaler technique, or progression of COPD or comorbid conditions.  CAT MCID is 2 points  mMRC: mMRC (Modified Medical Research Council) Dyspnea Scale is used to assess the degree of baseline functional disability in patients of respiratory disease due to dyspnea. No minimal important difference is established. A decrease in score of 1 point or greater is considered a positive change.   Pulmonary Function Assessment:   Exercise Target Goals: Exercise Program Goal: Individual exercise prescription set using results from initial 6 min walk test and THRR while considering  patient's activity barriers and safety.   Exercise Prescription Goal: Initial exercise prescription builds to 30-45 minutes a day of aerobic activity, 2-3 days per week.  Home exercise guidelines will be given to patient during program as part of exercise prescription that the participant will acknowledge.  Activity Barriers & Risk Stratification:  Activity Barriers & Cardiac Risk Stratification - 04/13/22 0908       Activity Barriers & Cardiac Risk Stratification   Activity Barriers Back Problems;Deconditioning;Shortness of Breath;Balance Concerns;History of Falls;Assistive Device    Cardiac Risk Stratification High             6 Minute Walk:  6 Minute Walk     Row Name 04/13/22 0908         6 Minute Walk   Phase Initial     Distance 400 feet     Walk Time 6 minutes     # of Rest Breaks 1     MPH 0.75     METS -0.2     RPE 13     Perceived Dyspnea  13     VO2 Peak -0.73     Symptoms Yes (comment)     Comments One sitting break 40 minutes due to weakness in leg     Resting HR 64 bpm     Resting BP 122/50     Resting Oxygen Saturation  98 %     Exercise Oxygen Saturation  during 6 min walk 97 %     Max Ex. HR 82 bpm     Max Ex. BP 136/58     2 Minute Post BP 118/50       Interval HR   1 Minute HR 79     2 Minute HR 81      3 Minute HR 82     4 Minute HR 76     5 Minute HR 82     6 Minute HR 82     2 Minute Post HR 70     Interval Heart Rate? Yes       Interval Oxygen   Interval Oxygen? Yes     Baseline Oxygen Saturation % 98 %     1 Minute Oxygen Saturation % 97 %     1 Minute Liters of Oxygen 0 L     2 Minute Oxygen Saturation % 98 %     2 Minute Liters of Oxygen 0 L     3 Minute Oxygen Saturation % 97 %     3 Minute Liters of Oxygen 0 L     4 Minute Oxygen Saturation % 98 %     4 Minute Liters of Oxygen 0 L     5 Minute Oxygen Saturation % 98 %     5 Minute Liters of Oxygen 0 L     6 Minute Oxygen Saturation %  97 %     6 Minute Liters of Oxygen 0 L     2 Minute Post Oxygen Saturation % 97 %     2 Minute Post Liters of Oxygen 0 L              Oxygen Initial Assessment:  Oxygen Initial Assessment - 04/13/22 0857       Initial 6 min Walk   Oxygen Used None      Program Oxygen Prescription   Program Oxygen Prescription None      Intervention   Short Term Goals To learn and understand importance of monitoring SPO2 with pulse oximeter and demonstrate accurate use of the pulse oximeter.;To learn and understand importance of maintaining oxygen saturations>88%;To learn and demonstrate proper pursed lip breathing techniques or other breathing techniques. ;To learn and demonstrate proper use of respiratory medications    Long  Term Goals Verbalizes importance of monitoring SPO2 with pulse oximeter and return demonstration;Maintenance of O2 saturations>88%;Exhibits proper breathing techniques, such as pursed lip breathing or other method taught during program session;Compliance with respiratory medication             Oxygen Re-Evaluation:  Oxygen Re-Evaluation     Row Name 05/19/22 0729             Program Oxygen Prescription   Program Oxygen Prescription None         Home Oxygen   Home Oxygen Device None       Sleep Oxygen Prescription CPAP       Liters per minute 0        Home Exercise Oxygen Prescription None       Home Resting Oxygen Prescription None       Compliance with Home Oxygen Use No         Goals/Expected Outcomes   Short Term Goals To learn and understand importance of monitoring SPO2 with pulse oximeter and demonstrate accurate use of the pulse oximeter.;To learn and understand importance of maintaining oxygen saturations>88%;To learn and demonstrate proper pursed lip breathing techniques or other breathing techniques. ;To learn and demonstrate proper use of respiratory medications       Long  Term Goals Verbalizes importance of monitoring SPO2 with pulse oximeter and return demonstration;Maintenance of O2 saturations>88%;Exhibits proper breathing techniques, such as pursed lip breathing or other method taught during program session;Compliance with respiratory medication       Goals/Expected Outcomes compliance                Oxygen Discharge (Final Oxygen Re-Evaluation):  Oxygen Re-Evaluation - 05/19/22 0729       Program Oxygen Prescription   Program Oxygen Prescription None      Home Oxygen   Home Oxygen Device None    Sleep Oxygen Prescription CPAP    Liters per minute 0    Home Exercise Oxygen Prescription None    Home Resting Oxygen Prescription None    Compliance with Home Oxygen Use No      Goals/Expected Outcomes   Short Term Goals To learn and understand importance of monitoring SPO2 with pulse oximeter and demonstrate accurate use of the pulse oximeter.;To learn and understand importance of maintaining oxygen saturations>88%;To learn and demonstrate proper pursed lip breathing techniques or other breathing techniques. ;To learn and demonstrate proper use of respiratory medications    Long  Term Goals Verbalizes importance of monitoring SPO2 with pulse oximeter and return demonstration;Maintenance of O2 saturations>88%;Exhibits proper breathing techniques, such as pursed lip breathing  or other method taught during program  session;Compliance with respiratory medication    Goals/Expected Outcomes compliance             Initial Exercise Prescription:  Initial Exercise Prescription - 04/13/22 0900       Date of Initial Exercise RX and Referring Provider   Date 04/13/22    Referring Provider Dr. Rosaria Ferries    Expected Discharge Date 08/26/22      NuStep   Level 1    SPM 60    Minutes 22      Arm Ergometer   Level 1    RPM 50    Minutes 22      Prescription Details   Frequency (times per week) 2    Duration Progress to 30 minutes of continuous aerobic without signs/symptoms of physical distress      Intensity   THRR 40-80% of Max Heartrate 56-113    Ratings of Perceived Exertion 11-13    Perceived Dyspnea 0-4      Resistance Training   Training Prescription Yes    Weight 3    Reps 10-15             Perform Capillary Blood Glucose checks as needed.  Exercise Prescription Changes:   Exercise Prescription Changes     Row Name 04/29/22 1611 05/11/22 1500           Response to Exercise   Blood Pressure (Admit) 128/60 116/50      Blood Pressure (Exercise) 142/68 138/58      Blood Pressure (Exit) 124/62 120/58      Heart Rate (Admit) 73 bpm 64 bpm      Heart Rate (Exercise) 78 bpm 72 bpm      Heart Rate (Exit) 71 bpm 69 bpm      Oxygen Saturation (Admit) 99 % 99 %      Oxygen Saturation (Exercise) 98 % 99 %      Oxygen Saturation (Exit) 99 % 99 %      Rating of Perceived Exertion (Exercise) 11 12      Perceived Dyspnea (Exercise) 12 13      Duration Continue with 30 min of aerobic exercise without signs/symptoms of physical distress. Continue with 30 min of aerobic exercise without signs/symptoms of physical distress.      Intensity THRR unchanged THRR unchanged        Progression   Progression Continue to progress workloads to maintain intensity without signs/symptoms of physical distress. Continue to progress workloads to maintain intensity without signs/symptoms of physical  distress.        Resistance Training   Training Prescription Yes Yes      Weight 2 4      Reps 10-15 10-15      Time 10 Minutes 10 Minutes        NuStep   Level 1 1      SPM 62 64      Minutes 22 22      METs 1.8 1.8        Arm Ergometer   Level 1 1      RPM 48 41      Minutes 17 17      METs 1.4 1.3               Exercise Comments:   Exercise Comments     Row Name 04/27/22 1600           Exercise Comments Pt completed first exercise  session with no complaints.                Exercise Goals and Review:   Exercise Goals     Row Name 04/13/22 0912 05/19/22 0731           Exercise Goals   Increase Physical Activity Yes Yes      Intervention Provide advice, education, support and counseling about physical activity/exercise needs.;Develop an individualized exercise prescription for aerobic and resistive training based on initial evaluation findings, risk stratification, comorbidities and participant's personal goals. Provide advice, education, support and counseling about physical activity/exercise needs.;Develop an individualized exercise prescription for aerobic and resistive training based on initial evaluation findings, risk stratification, comorbidities and participant's personal goals.      Expected Outcomes Short Term: Attend rehab on a regular basis to increase amount of physical activity.;Long Term: Add in home exercise to make exercise part of routine and to increase amount of physical activity.;Long Term: Exercising regularly at least 3-5 days a week. Short Term: Attend rehab on a regular basis to increase amount of physical activity.;Long Term: Add in home exercise to make exercise part of routine and to increase amount of physical activity.;Long Term: Exercising regularly at least 3-5 days a week.      Increase Strength and Stamina Yes Yes      Intervention Provide advice, education, support and counseling about physical activity/exercise  needs.;Develop an individualized exercise prescription for aerobic and resistive training based on initial evaluation findings, risk stratification, comorbidities and participant's personal goals. Provide advice, education, support and counseling about physical activity/exercise needs.;Develop an individualized exercise prescription for aerobic and resistive training based on initial evaluation findings, risk stratification, comorbidities and participant's personal goals.      Expected Outcomes Short Term: Increase workloads from initial exercise prescription for resistance, speed, and METs.;Short Term: Perform resistance training exercises routinely during rehab and add in resistance training at home;Long Term: Improve cardiorespiratory fitness, muscular endurance and strength as measured by increased METs and functional capacity (6MWT) Short Term: Increase workloads from initial exercise prescription for resistance, speed, and METs.;Short Term: Perform resistance training exercises routinely during rehab and add in resistance training at home;Long Term: Improve cardiorespiratory fitness, muscular endurance and strength as measured by increased METs and functional capacity (6MWT)      Able to understand and use rate of perceived exertion (RPE) scale Yes Yes      Intervention Provide education and explanation on how to use RPE scale Provide education and explanation on how to use RPE scale      Expected Outcomes Short Term: Able to use RPE daily in rehab to express subjective intensity level;Long Term:  Able to use RPE to guide intensity level when exercising independently Short Term: Able to use RPE daily in rehab to express subjective intensity level;Long Term:  Able to use RPE to guide intensity level when exercising independently      Able to understand and use Dyspnea scale Yes Yes      Intervention Provide education and explanation on how to use Dyspnea scale Provide education and explanation on how to  use Dyspnea scale      Expected Outcomes Short Term: Able to use Dyspnea scale daily in rehab to express subjective sense of shortness of breath during exertion;Long Term: Able to use Dyspnea scale to guide intensity level when exercising independently Short Term: Able to use Dyspnea scale daily in rehab to express subjective sense of shortness of breath during exertion;Long Term: Able to use  Dyspnea scale to guide intensity level when exercising independently      Knowledge and understanding of Target Heart Rate Range (THRR) Yes Yes      Intervention Provide education and explanation of THRR including how the numbers were predicted and where they are located for reference Provide education and explanation of THRR including how the numbers were predicted and where they are located for reference      Expected Outcomes Short Term: Able to state/look up THRR;Long Term: Able to use THRR to govern intensity when exercising independently;Short Term: Able to use daily as guideline for intensity in rehab Short Term: Able to state/look up THRR;Long Term: Able to use THRR to govern intensity when exercising independently;Short Term: Able to use daily as guideline for intensity in rehab      Understanding of Exercise Prescription Yes Yes      Intervention Provide education, explanation, and written materials on patient's individual exercise prescription Provide education, explanation, and written materials on patient's individual exercise prescription      Expected Outcomes Short Term: Able to explain program exercise prescription;Long Term: Able to explain home exercise prescription to exercise independently Short Term: Able to explain program exercise prescription;Long Term: Able to explain home exercise prescription to exercise independently               Exercise Goals Re-Evaluation :  Exercise Goals Re-Evaluation     Row Name 04/20/22 1407 05/19/22 0732           Exercise Goal Re-Evaluation    Exercise Goals Review Increase Physical Activity;Increase Strength and Stamina;Able to understand and use rate of perceived exertion (RPE) scale;Able to understand and use Dyspnea scale;Knowledge and understanding of Target Heart Rate Range (THRR);Understanding of Exercise Prescription Increase Physical Activity;Increase Strength and Stamina;Able to understand and use rate of perceived exertion (RPE) scale;Able to understand and use Dyspnea scale;Knowledge and understanding of Target Heart Rate Range (THRR);Understanding of Exercise Prescription      Comments Pt has not started pulmonary rehab due to classes being at capacity. Will evaluate when he startes. Pt has completed 3 exercise sessions of pulmonary rehab. His wife is pushing him to do the class, and he was originally not motivated to participate. He has seemed to enjoy the class the few times that he has been here. He is currently having trouble with transportation and it is unclear if he will be able to continue. He is severely deconditioned, so progress will likely be slow. He is currently exercising at 1.8 METs on the stepper. Will continue to monitor and progress as able.      Expected Outcomes Through exercising at rehab and at home patient will reach their goals. Through exercising at rehab and at home patient will reach their goals.               Discharge Exercise Prescription (Final Exercise Prescription Changes):  Exercise Prescription Changes - 05/11/22 1500       Response to Exercise   Blood Pressure (Admit) 116/50    Blood Pressure (Exercise) 138/58    Blood Pressure (Exit) 120/58    Heart Rate (Admit) 64 bpm    Heart Rate (Exercise) 72 bpm    Heart Rate (Exit) 69 bpm    Oxygen Saturation (Admit) 99 %    Oxygen Saturation (Exercise) 99 %    Oxygen Saturation (Exit) 99 %    Rating of Perceived Exertion (Exercise) 12    Perceived Dyspnea (Exercise) 13    Duration Continue  with 30 min of aerobic exercise without  signs/symptoms of physical distress.    Intensity THRR unchanged      Progression   Progression Continue to progress workloads to maintain intensity without signs/symptoms of physical distress.      Resistance Training   Training Prescription Yes    Weight 4    Reps 10-15    Time 10 Minutes      NuStep   Level 1    SPM 64    Minutes 22    METs 1.8      Arm Ergometer   Level 1    RPM 41    Minutes 17    METs 1.3             Nutrition:  Target Goals: Understanding of nutrition guidelines, daily intake of sodium '1500mg'$ , cholesterol '200mg'$ , calories 30% from fat and 7% or less from saturated fats, daily to have 5 or more servings of fruits and vegetables.  Biometrics:  Pre Biometrics - 04/13/22 0913       Pre Biometrics   Height '5\' 8"'$  (1.727 m)    Weight 128.2 kg    Waist Circumference 53 inches    Hip Circumference 48 inches    Waist to Hip Ratio 1.1 %    BMI (Calculated) 42.98    Triceps Skinfold 18 mm    % Body Fat 40.7 %    Grip Strength 20.5 kg    Flexibility 0 in    Single Leg Stand 0 seconds              Nutrition Therapy Plan and Nutrition Goals:  Nutrition Therapy & Goals - 05/12/22 1335       Personal Nutrition Goals   Comments Patient weight this week was 129.8kg. We will continue to encourage heart healthy diet and provide him with 2 educational handouts and assistance with RD referreal if patient is interested.      Intervention Plan   Intervention Nutrition handout(s) given to patient.    Expected Outcomes Short Term Goal: Understand basic principles of dietary content, such as calories, fat, sodium, cholesterol and nutrients.             Nutrition Assessments:  Nutrition Assessments - 04/13/22 0846       MEDFICTS Scores   Pre Score 32            MEDIFICTS Score Key: ?70 Need to make dietary changes  40-70 Heart Healthy Diet ? 40 Therapeutic Level Cholesterol Diet   Picture Your Plate Scores: <09 Unhealthy dietary  pattern with much room for improvement. 41-50 Dietary pattern unlikely to meet recommendations for good health and room for improvement. 51-60 More healthful dietary pattern, with some room for improvement.  >60 Healthy dietary pattern, although there may be some specific behaviors that could be improved.    Nutrition Goals Re-Evaluation:   Nutrition Goals Discharge (Final Nutrition Goals Re-Evaluation):   Psychosocial: Target Goals: Acknowledge presence or absence of significant depression and/or stress, maximize coping skills, provide positive support system. Participant is able to verbalize types and ability to use techniques and skills needed for reducing stress and depression.  Initial Review & Psychosocial Screening:  Initial Psych Review & Screening - 04/13/22 0909       Initial Review   Current issues with None Identified      Family Dynamics   Good Support System? Yes    Comments His support system includes his wife and his daughter.  Barriers   Psychosocial barriers to participate in program There are no identifiable barriers or psychosocial needs.      Screening Interventions   Interventions Encouraged to exercise    Expected Outcomes Long Term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

## 2022-05-20 ENCOUNTER — Encounter (HOSPITAL_COMMUNITY): Admission: RE | Admit: 2022-05-20 | Payer: No Typology Code available for payment source | Source: Ambulatory Visit

## 2022-05-24 LAB — UPEP/UIFE/LIGHT CHAINS/TP, 24-HR UR
% BETA, Urine: 0 %
ALPHA 1 URINE: 0 %
Albumin, U: 100 %
Alpha 2, Urine: 0 %
Free Kappa Lt Chains,Ur: 15.52 mg/L (ref 1.17–86.46)
Free Kappa/Lambda Ratio: 2.29 (ref 1.83–14.26)
Free Lambda Lt Chains,Ur: 6.79 mg/L (ref 0.27–15.21)
GAMMA GLOBULIN URINE: 0 %
Total Protein, Urine-Ur/day: 245 mg/24 hr — ABNORMAL HIGH (ref 30–150)
Total Protein, Urine: 10.9 mg/dL
Total Volume: 2250

## 2022-05-25 ENCOUNTER — Encounter (HOSPITAL_COMMUNITY)
Admission: RE | Admit: 2022-05-25 | Discharge: 2022-05-25 | Disposition: A | Discharge status: Home / Self Care | Payer: No Typology Code available for payment source | Source: Ambulatory Visit | Attending: Critical Care Medicine | Admitting: Critical Care Medicine

## 2022-05-25 DIAGNOSIS — J449 Chronic obstructive pulmonary disease, unspecified: Secondary | ICD-10-CM

## 2022-05-25 NOTE — Progress Notes (Signed)
Daily Session Note  Patient Details  Name: [name] MRN: [id number] Date of Birth: 22-Jan-1941 Referring Provider:   Flowsheet Row PULMONARY REHAB COPD ORIENTATION from 04/13/2022 in [place]  Referring Provider Dr. Rosaria Ferries       Encounter Date: 05/25/2022  Check In:  Session Check In - 05/25/22 1457       Check-In   Supervising physician immediately available to respond to emergencies [facility] MD immediately available    Physician(s) Dr Debara Pickett    Location [facility]    Staff Present Hoy Register, MS, ACSM-CEP, Exercise Physiologist;Phyllis Billingsley, RN;Breindy Meadow Hassell Done, RN, BSN    Virtual Visit No    Medication changes reported     No    Fall or balance concerns reported    Yes    Comments He has fallen several times over the past couple of years. 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    Tobacco Cessation No Change    Warm-up and Cool-down Performed as group-led instruction    Resistance Training Performed Yes    VAD Patient? No    PAD/SET Patient? No      Pain Assessment   Currently in Pain? No/denies    Pain Score 0-No pain    Multiple Pain Sites No             Capillary Blood Glucose: No results found for this or any previous visit (from the past 24 hour(s)).    Social History   Tobacco Use  Smoking Status Former   Packs/day: 1.00   Years: 20.00   Total pack years: 20.00   Types: Cigarettes   Quit date: 30   Years since quitting: 40.5  Smokeless Tobacco Never    Goals Met:  Proper associated with RPD/PD & O2 Sat Independence with exercise equipment Using PLB without cueing & demonstrates good technique Exercise tolerated well Queuing for purse lip breathing No report of concerns or symptoms today Strength training completed today  Goals Unmet:  Not Applicable  Comments: checkout at 1600.   Dr. Kathie Dike is Medical Director for [facility] Pulmonary Rehab.

## 2022-05-27 ENCOUNTER — Encounter (HOSPITAL_COMMUNITY)
Admission: RE | Admit: 2022-05-27 | Discharge: 2022-05-27 | Disposition: A | Discharge status: Home / Self Care | Payer: No Typology Code available for payment source | Source: Ambulatory Visit | Attending: Critical Care Medicine | Admitting: Critical Care Medicine

## 2022-05-27 DIAGNOSIS — J449 Chronic obstructive pulmonary disease, unspecified: Secondary | ICD-10-CM

## 2022-05-27 NOTE — Progress Notes (Signed)
Daily Session Note  Patient Details  Name: [name] MRN: [id number] Date of Birth: 09/21/1941 Referring Provider:   Flowsheet Row PULMONARY REHAB COPD ORIENTATION from 04/13/2022 in [place]  Referring Provider Dr. Rosaria Ferries       Encounter Date: 05/27/2022  Check In:  Session Check In - 05/27/22 1500       Check-In   Supervising physician immediately available to respond to emergencies [facility] MD immediately available    Physician(s) Dr Gardiner Rhyme    Location [facility]    Staff Present Hoy Register, MS, ACSM-CEP, Exercise Physiologist;Phyllis Billingsley, RN;Clance Baquero Hassell Done, RN, BSN    Virtual Visit No    Medication changes reported     No    Fall or balance concerns reported    Yes    Comments He has fallen several times over the past couple of years. 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    Tobacco Cessation No Change    Warm-up and Cool-down Performed as group-led instruction    Resistance Training Performed Yes    VAD Patient? No    PAD/SET Patient? No      Pain Assessment   Currently in Pain? No/denies    Pain Score 0-No pain    Multiple Pain Sites No             Capillary Blood Glucose: No results found for this or any previous visit (from the past 24 hour(s)).    Social History   Tobacco Use  Smoking Status Former   Packs/day: 1.00   Years: 20.00   Total pack years: 20.00   Types: Cigarettes   Quit date: 56   Years since quitting: 40.5  Smokeless Tobacco Never    Goals Met:  Proper associated with RPD/PD & O2 Sat Independence with exercise equipment Using PLB without cueing & demonstrates good technique Exercise tolerated well Queuing for purse lip breathing No report of concerns or symptoms today Strength training completed today  Goals Unmet:  Not Applicable  Comments: checkout at 1600.   Dr. Kathie Dike is Medical Director for [facility] Pulmonary Rehab.

## 2022-05-31 DIAGNOSIS — E7800 Pure hypercholesterolemia, unspecified: Secondary | ICD-10-CM | POA: Diagnosis not present

## 2022-05-31 DIAGNOSIS — E1165 Type 2 diabetes mellitus with hyperglycemia: Secondary | ICD-10-CM | POA: Diagnosis not present

## 2022-05-31 DIAGNOSIS — I10 Essential (primary) hypertension: Secondary | ICD-10-CM | POA: Diagnosis not present

## 2022-06-01 ENCOUNTER — Encounter (HOSPITAL_COMMUNITY)
Admission: RE | Admit: 2022-06-01 | Discharge: 2022-06-01 | Disposition: A | Discharge status: Home / Self Care | Payer: No Typology Code available for payment source | Source: Ambulatory Visit | Attending: Critical Care Medicine | Admitting: Critical Care Medicine

## 2022-06-01 VITALS — Wt 281.1 lb

## 2022-06-01 DIAGNOSIS — J449 Chronic obstructive pulmonary disease, unspecified: Secondary | ICD-10-CM | POA: Diagnosis present

## 2022-06-01 NOTE — Progress Notes (Signed)
Daily Session Note  Patient Details  Name: [name] MRN: [id number] Date of Birth: 1941/01/29 Referring Provider:   Flowsheet Row PULMONARY REHAB COPD ORIENTATION from 04/13/2022 in [place]  Referring Provider Dr. Rosaria Ferries       Encounter Date: 06/01/2022  Check In:  Session Check In - 06/01/22 1457       Check-In   Supervising physician immediately available to respond to emergencies [facility] MD immediately available    Physician(s) Dr Gardiner Rhyme    Location [facility]    Staff Present Aundra Dubin, RN, Bjorn Loser, MS, ACSM-CEP, Exercise Physiologist;Heather Zigmund Daniel, Exercise Physiologist;Phyllis Billingsley, RN    Virtual Visit No    Medication changes reported     No    Fall or balance concerns reported    Yes    Comments He has fallen several times over the past couple of years. 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    Tobacco Cessation No Change    Warm-up and Cool-down Performed as group-led instruction    Resistance Training Performed Yes    VAD Patient? No    PAD/SET Patient? No      Pain Assessment   Currently in Pain? No/denies    Pain Score 0-No pain    Multiple Pain Sites No             Capillary Blood Glucose: No results found for this or any previous visit (from the past 24 hour(s)).    Social History   Tobacco Use  Smoking Status Former   Packs/day: 1.00   Years: 20.00   Total pack years: 20.00   Types: Cigarettes   Quit date: 56   Years since quitting: 40.6  Smokeless Tobacco Never    Goals Met:  Proper associated with RPD/PD & O2 Sat Independence with exercise equipment Using PLB without cueing & demonstrates good technique Exercise tolerated well No report of concerns or symptoms today Strength training completed today  Goals Unmet:  Not Applicable  Comments: Check out 1600.   Dr. Kathie Dike is Medical Director for [facility] Pulmonary Rehab.

## 2022-06-02 DIAGNOSIS — N189 Chronic kidney disease, unspecified: Secondary | ICD-10-CM | POA: Diagnosis not present

## 2022-06-02 DIAGNOSIS — Z8679 Personal history of other diseases of the circulatory system: Secondary | ICD-10-CM | POA: Diagnosis not present

## 2022-06-02 DIAGNOSIS — Z6841 Body Mass Index (BMI) 40.0 and over, adult: Secondary | ICD-10-CM | POA: Diagnosis not present

## 2022-06-02 DIAGNOSIS — I9589 Other hypotension: Secondary | ICD-10-CM | POA: Diagnosis not present

## 2022-06-02 DIAGNOSIS — I5032 Chronic diastolic (congestive) heart failure: Secondary | ICD-10-CM | POA: Diagnosis not present

## 2022-06-02 DIAGNOSIS — E875 Hyperkalemia: Secondary | ICD-10-CM | POA: Diagnosis not present

## 2022-06-02 DIAGNOSIS — E559 Vitamin D deficiency, unspecified: Secondary | ICD-10-CM | POA: Diagnosis not present

## 2022-06-02 DIAGNOSIS — E8722 Chronic metabolic acidosis: Secondary | ICD-10-CM | POA: Diagnosis not present

## 2022-06-02 DIAGNOSIS — D472 Monoclonal gammopathy: Secondary | ICD-10-CM | POA: Diagnosis not present

## 2022-06-02 DIAGNOSIS — E1122 Type 2 diabetes mellitus with diabetic chronic kidney disease: Secondary | ICD-10-CM | POA: Diagnosis not present

## 2022-06-02 DIAGNOSIS — D638 Anemia in other chronic diseases classified elsewhere: Secondary | ICD-10-CM | POA: Diagnosis not present

## 2022-06-02 DIAGNOSIS — R809 Proteinuria, unspecified: Secondary | ICD-10-CM | POA: Diagnosis not present

## 2022-06-03 ENCOUNTER — Encounter (HOSPITAL_COMMUNITY)
Admission: RE | Admit: 2022-06-03 | Discharge: 2022-06-03 | Disposition: A | Discharge status: Home / Self Care | Payer: No Typology Code available for payment source | Source: Ambulatory Visit | Attending: Critical Care Medicine | Admitting: Critical Care Medicine

## 2022-06-03 DIAGNOSIS — J449 Chronic obstructive pulmonary disease, unspecified: Secondary | ICD-10-CM | POA: Diagnosis not present

## 2022-06-03 NOTE — Progress Notes (Signed)
Daily Session Note  Patient Details  Name: [name] MRN: [id number] Date of Birth: 07/21/1941 Referring Provider:   Flowsheet Row PULMONARY REHAB COPD ORIENTATION from 04/13/2022 in [place] C[facility]  Referring Provider Dr. Marion       Encounter Date: 06/03/2022  Check In:  Session Check In - 06/03/22 1457       Check-In   Supervising physician immediately available to respond to emergencies [facility] MD immediately available    Physician(s) Dr. Pemberton    Location [facility]    Staff Present Phyllis Billingsley, RN;Daphyne Martin, RN, BSN;Dalton Fletcher, MS, ACSM-CEP, Exercise Physiologist    Virtual Visit No    Medication changes reported     No    Fall or balance concerns reported    Yes    Comments He has fallen several times over the past couple of years. 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    Tobacco Cessation No Change    Warm-up and Cool-down Performed as group-led instruction    Resistance Training Performed Yes    VAD Patient? No    PAD/SET Patient? No      Pain Assessment   Currently in Pain? No/denies    Pain Score 0-No pain    Multiple Pain Sites No             Capillary Blood Glucose: No results found for this or any previous visit (from the past 24 hour(s)).    Social History   Tobacco Use  Smoking Status Former   Packs/day: 1.00   Years: 20.00   Total pack years: 20.00   Types: Cigarettes   Quit date: 1983   Years since quitting: 40.6  Smokeless Tobacco Never    Goals Met:  Proper associated with RPD/PD & O2 Sat Independence with exercise equipment Exercise tolerated well No report of concerns or symptoms today Strength training completed today  Goals Unmet:  Not Applicable  Comments: check out @ 4:00pm   Dr. Jehanzeb Memon is Medical Director for [facility]. 

## 2022-06-08 ENCOUNTER — Encounter (HOSPITAL_COMMUNITY)
Admission: RE | Admit: 2022-06-08 | Discharge: 2022-06-08 | Disposition: A | Discharge status: Home / Self Care | Payer: No Typology Code available for payment source | Source: Ambulatory Visit | Attending: Critical Care Medicine | Admitting: Critical Care Medicine

## 2022-06-08 DIAGNOSIS — J449 Chronic obstructive pulmonary disease, unspecified: Secondary | ICD-10-CM

## 2022-06-08 NOTE — Progress Notes (Signed)
Daily Session Note  Patient Details  Name: [name] MRN: [id number] Date of Birth: 07-08-1941 Referring Provider:   Flowsheet Row PULMONARY REHAB COPD ORIENTATION from 04/13/2022 in [place]  Referring Provider Dr. Rosaria Ferries       Encounter Date: 06/08/2022  Check In:  Session Check In - 06/08/22 1444       Check-In   Supervising physician immediately available to respond to emergencies [facility] MD immediately available    Physician(s) Dr Phineas Inches    Location [facility]    Staff Present Geanie Cooley, RN;Ovadia Lopp Hassell Done, RN, BSN;Heather Otho Ket, BS, Exercise Physiologist    Virtual Visit No    Medication changes reported     No    Fall or balance concerns reported    Yes    Comments He has fallen several times over the past couple of years. 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    Tobacco Cessation No Change    Warm-up and Cool-down Performed as group-led instruction    Resistance Training Performed Yes    VAD Patient? No    PAD/SET Patient? No      Pain Assessment   Currently in Pain? No/denies    Pain Score 0-No pain    Multiple Pain Sites No             Capillary Blood Glucose: No results found for this or any previous visit (from the past 24 hour(s)).    Social History   Tobacco Use  Smoking Status Former   Packs/day: 1.00   Years: 20.00   Total pack years: 20.00   Types: Cigarettes   Quit date: 76   Years since quitting: 40.6  Smokeless Tobacco Never    Goals Met:  Proper associated with RPD/PD & O2 Sat Independence with exercise equipment Using PLB without cueing & demonstrates good technique Exercise tolerated well Queuing for purse lip breathing No report of concerns or symptoms today Strength training completed today  Goals Unmet:  Not Applicable  Comments: checkout at 1600.   Dr. Kathie Dike is Medical Director for [facility].

## 2022-06-10 ENCOUNTER — Encounter (HOSPITAL_COMMUNITY)
Admission: RE | Admit: 2022-06-10 | Discharge: 2022-06-10 | Disposition: A | Discharge status: Home / Self Care | Payer: No Typology Code available for payment source | Source: Ambulatory Visit | Attending: Critical Care Medicine | Admitting: Critical Care Medicine

## 2022-06-10 DIAGNOSIS — J449 Chronic obstructive pulmonary disease, unspecified: Secondary | ICD-10-CM

## 2022-06-10 NOTE — Progress Notes (Signed)
Daily Session Note  Patient Details  Name: [name] MRN: [id number] Date of Birth: [birth year] Referring Provider:   Flowsheet Row PULMONARY REHAB COPD ORIENTATION from 04/13/2022 in [place]  Referring Provider Dr. Rosaria Ferries       Encounter Date: 06/10/2022  Check In:  Session Check In - 06/10/22 1500       Check-In   Supervising physician immediately available to respond to emergencies [facility] MD immediately available    Physician(s) Dr. Harl Bowie    Location [facility]    Staff Present Geanie Cooley, RN;Heather Otho Ket, BS, Exercise Physiologist;Dalton Kris Mouton, MS, ACSM-CEP, Exercise Physiologist;Debra Wynetta Emery, RN, BSN    Virtual Visit No    Medication changes reported     No    Fall or balance concerns reported    Yes    Comments He has fallen several times over the past couple of years. 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    Tobacco Cessation No Change    Warm-up and Cool-down Performed as group-led instruction    Resistance Training Performed Yes    VAD Patient? No    PAD/SET Patient? No      Pain Assessment   Currently in Pain? No/denies    Pain Score 0-No pain    Multiple Pain Sites No             Capillary Blood Glucose: No results found for this or any previous visit (from the past 24 hour(s)).    Social History   Tobacco Use  Smoking Status Former   Packs/day: 1.00   Years: 20.00   Total pack years: 20.00   Types: Cigarettes   Quit date: 51   Years since quitting: 40.6  Smokeless Tobacco Never    Goals Met:  Proper associated with RPD/PD & O2 Sat Improved SOB with ADL's Exercise tolerated well No report of concerns or symptoms today Strength training completed today  Goals Unmet:  Not Applicable  Comments: check out @ 4:00pm   Dr. Kathie Dike is Medical Director for [facility].

## 2022-06-15 ENCOUNTER — Encounter (HOSPITAL_COMMUNITY): Payer: No Typology Code available for payment source

## 2022-06-16 NOTE — Progress Notes (Signed)
Pulmonary Individual Treatment Plan  Patient Details  Name: [name] MRN: [id number] Date of Birth: 16-Jan-1941 Referring Provider:   Flowsheet Row PULMONARY REHAB COPD ORIENTATION from 04/13/2022 in [place]  Referring Provider Dr. Rosaria Ferries       Initial Encounter Date:  Flowsheet Row PULMONARY REHAB COPD ORIENTATION from 04/13/2022 in [place]  Date 04/13/22       Visit Diagnosis: Chronic obstructive pulmonary disease, unspecified COPD type ([place])  Patient's Home Medications on Admission:   Current Outpatient Medications:    acetaminophen (TYLENOL) 650 MG CR tablet, Take 1,300 mg by mouth every 8 (eight) hours as needed for pain., Disp: , Rfl:    Adalimumab 40 MG/0.8ML PNKT, Inject into the skin., Disp: , Rfl:    albuterol (VENTOLIN HFA) 108 (90 Base) MCG/ACT inhaler, Inhale 1-2 puffs into the lungs every 6 (six) hours as needed for shortness of breath., Disp: , Rfl:    amLODipine (NORVASC) 10 MG tablet, Take 10 mg by mouth daily., Disp: , Rfl:    apixaban (ELIQUIS) 5 MG TABS tablet, Take 1 tablet (5 mg total) by mouth 2 (two) times daily., Disp: 14 tablet, Rfl: 0   atorvastatin (LIPITOR) 80 MG tablet, Take 80 mg by mouth every evening. , Disp: , Rfl:    carboxymethylcellulose (REFRESH PLUS) 0.5 % SOLN, Place 1 drop into both eyes 2 (two) times daily as needed for dry eyes., Disp: , Rfl:    Cyanocobalamin (VITAMIN B 12) 500 MCG TABS, Take 1,000 mcg by mouth daily., Disp: 180 tablet, Rfl: 1   ferrous sulfate 325 (65 FE) MG tablet, Take 1 tablet (325 mg total) by mouth daily with breakfast., Disp: 90 tablet, Rfl: 3   glucose-Vitamin C 4-0.006 GM CHEW chewable tablet, Chew 4 tablets by mouth as directed. CHEW FOUR TABLETS BY MOUTH AS DIRECTED BY YOUR PROVIDER (REPEAT EVERY 15 MINUTES IF BLOOD SUGAR LESS THAN 70), Disp: , Rfl:    insulin aspart protamine- aspart (NOVOLOG MIX 70/30) (70-30) 100 UNIT/ML injection, 42 units in the morning  and 50 units at night. (Patient taking differently: Inject 40 Units into the skin 2 (two) times daily with a meal.), Disp: 10 mL, Rfl: 0   isosorbide mononitrate (IMDUR) 30 MG 24 hr tablet, Take 1 tablet (30 mg total) by mouth every evening., Disp: 90 tablet, Rfl: 3   losartan (COZAAR) 100 MG tablet, Take 100 mg by mouth daily., Disp: , Rfl:    metFORMIN (GLUCOPHAGE) 1000 MG tablet, Take 1,000 mg by mouth 2 (two) times daily with a meal., Disp: , Rfl:    metoprolol tartrate (LOPRESSOR) 50 MG tablet, Take 25 mg by mouth 2 (two) times daily., Disp: , Rfl:    mycophenolate (CELLCEPT) 500 MG tablet, Take 100 mg by mouth in the morning and at bedtime., Disp: , Rfl:    pioglitazone (ACTOS) 30 MG tablet, Take 30 mg by mouth daily., Disp: , Rfl:    Semaglutide (OZEMPIC, 0.25 OR 0.5 MG/DOSE, [place]), Inject 0.25 mg into the skin once a week., Disp: , Rfl:    spironolactone (ALDACTONE) 25 MG tablet, Take 25 mg by mouth daily., Disp: , Rfl:    tamsulosin (FLOMAX) 0.4 MG CAPS capsule, Take 2 capsules (0.8 mg total) by mouth every evening., Disp: 60 capsule, Rfl: 3   Tiotropium Bromide-Olodaterol 2.5-2.5 MCG/ACT AERS, Inhale 2 puffs into the lungs daily., Disp: , Rfl:    torsemide (DEMADEX) 20 MG tablet, Take 20 mg daily alternating with 40  mg the next day (Patient taking differently: Take 20-40 mg by mouth daily. Take 20 mg daily alternating with 40 mg the next day), Disp: 135 tablet, Rfl: 3  Past Medical History: Past Medical History:  Diagnosis Date   Anemia    C. difficile diarrhea    CKD (chronic kidney disease), stage III (HCC)    Coronary artery disease    Status post CABG in 2007 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/2011   GERD (gastroesophageal reflux disease)    Glaucoma    Hyperlipidemia    Hypertension    Morbid obesity ([place])    PAF (paroxysmal atrial fibrillation) ([place])    Peripheral vascular disease ([place])    Sleep apnea     Stroke ([place]) 2008, 2014, 2015    Tobacco Use: Social History   Tobacco Use  Smoking Status Former   Packs/day: 1.00   Years: 20.00   Total pack years: 20.00   Types: Cigarettes   Quit date: 5   Years since quitting: 40.6  Smokeless Tobacco Never    Labs: Review Flowsheet  More data exists      Latest Ref Rng & Units 06/08/2019 03/07/2020 07/08/2020 08/16/2020 05/13/2022  Labs for ITP Cardiac and Pulmonary Rehab  Cholestrol 0 - 200 mg/dL - 154  - 128  -  LDL (calc) 0 - 99 mg/dL - 98  - 76  -  HDL-C >40 mg/dL - 35  - 30  -  Trlycerides <150 mg/dL - 118  - 110  -  Hemoglobin A1c 4.8 - 5.6 % 7.6  8.2  8.3  8.4  7.8     Capillary Blood Glucose: Lab Results  Component Value Date   GLUCAP 123 (H) 05/11/2022   GLUCAP 157 (H) 04/29/2022   GLUCAP 178 (H) 04/27/2022   GLUCAP 214 (H) 08/16/2020   GLUCAP 177 (H) 05/03/2020    POCT Glucose     Row Name 04/13/22 0831             POCT Blood Glucose   Pre-Exercise 266 mg/dL                Pulmonary Assessment Scores:  Pulmonary Assessment Scores     Row Name 04/13/22 0837         ADL UCSD   SOB Score total 84     Rest 1     Walk 3     Stairs 5     Bath 0     Dress 3     Shop 5       CAT Score   CAT Score 22       mMRC Score   mMRC Score 4             UCSD: Self-administered rating of dyspnea associated with activities of daily living (ADLs) 6-point scale (0 = "not at all" to 5 = "maximal or unable to do because of breathlessness")  Scoring Scores range from 0 to 120.  Minimally important difference is 5 units  CAT: CAT can identify the health impairment of COPD patients and is better correlated with disease progression.  CAT has a scoring range of zero to 40. The CAT score is classified into four groups of low (less than 10), medium (10 - 20), high (21-30) and very high (31-40) based on the impact level of disease on health status. A CAT score over 10 suggests significant symptoms.  A  worsening CAT  score could be explained by an exacerbation, poor medication adherence, poor inhaler technique, or progression of COPD or comorbid conditions.  CAT MCID is 2 points  mMRC: mMRC (Modified Medical Research Council) Dyspnea Scale is used to assess the degree of baseline functional disability in patients of respiratory disease due to dyspnea. No minimal important difference is established. A decrease in score of 1 point or greater is considered a positive change.   Pulmonary Function Assessment:   Exercise Target Goals: Exercise Program Goal: Individual exercise prescription set using results from initial 6 min walk test and THRR while considering  patient's activity barriers and safety.   Exercise Prescription Goal: Initial exercise prescription builds to 30-45 minutes a day of aerobic activity, 2-3 days per week.  Home exercise guidelines will be given to patient during program as part of exercise prescription that the participant will acknowledge.  Activity Barriers & Risk Stratification:  Activity Barriers & Cardiac Risk Stratification - 04/13/22 0908       Activity Barriers & Cardiac Risk Stratification   Activity Barriers Back Problems;Deconditioning;Shortness of Breath;Balance Concerns;History of Falls;Assistive Device    Cardiac Risk Stratification High             6 Minute Walk:  6 Minute Walk     Row Name 04/13/22 0908         6 Minute Walk   Phase Initial     Distance 400 feet     Walk Time 6 minutes     # of Rest Breaks 1     MPH 0.75     METS -0.2     RPE 13     Perceived Dyspnea  13     VO2 Peak -0.73     Symptoms Yes (comment)     Comments One sitting break 40 minutes due to weakness in leg     Resting HR 64 bpm     Resting BP 122/50     Resting Oxygen Saturation  98 %     Exercise Oxygen Saturation  during 6 min walk 97 %     Max Ex. HR 82 bpm     Max Ex. BP 136/58     2 Minute Post BP 118/50       Interval HR   1 Minute HR 79     2 Minute HR 81      3 Minute HR 82     4 Minute HR 76     5 Minute HR 82     6 Minute HR 82     2 Minute Post HR 70     Interval Heart Rate? Yes       Interval Oxygen   Interval Oxygen? Yes     Baseline Oxygen Saturation % 98 %     1 Minute Oxygen Saturation % 97 %     1 Minute Liters of Oxygen 0 L     2 Minute Oxygen Saturation % 98 %     2 Minute Liters of Oxygen 0 L     3 Minute Oxygen Saturation % 97 %     3 Minute Liters of Oxygen 0 L     4 Minute Oxygen Saturation % 98 %     4 Minute Liters of Oxygen 0 L     5 Minute Oxygen Saturation % 98 %     5 Minute Liters of Oxygen 0 L     6 Minute Oxygen Saturation %  97 %     6 Minute Liters of Oxygen 0 L     2 Minute Post Oxygen Saturation % 97 %     2 Minute Post Liters of Oxygen 0 L              Oxygen Initial Assessment:  Oxygen Initial Assessment - 04/13/22 0857       Initial 6 min Walk   Oxygen Used None      Program Oxygen Prescription   Program Oxygen Prescription None      Intervention   Short Term Goals To learn and understand importance of monitoring SPO2 with pulse oximeter and demonstrate accurate use of the pulse oximeter.;To learn and understand importance of maintaining oxygen saturations>88%;To learn and demonstrate proper pursed lip breathing techniques or other breathing techniques. ;To learn and demonstrate proper use of respiratory medications    Long  Term Goals Verbalizes importance of monitoring SPO2 with pulse oximeter and return demonstration;Maintenance of O2 saturations>88%;Exhibits proper breathing techniques, such as pursed lip breathing or other method taught during program session;Compliance with respiratory medication             Oxygen Re-Evaluation:  Oxygen Re-Evaluation     Row Name 05/19/22 0729 06/16/22 0738           Program Oxygen Prescription   Program Oxygen Prescription None None        Home Oxygen   Home Oxygen Device None None      Sleep Oxygen Prescription CPAP CPAP       Liters per minute 0 0      Home Exercise Oxygen Prescription None None      Home Resting Oxygen Prescription None None      Compliance with Home Oxygen Use No No        Goals/Expected Outcomes   Short Term Goals To learn and understand importance of monitoring SPO2 with pulse oximeter and demonstrate accurate use of the pulse oximeter.;To learn and understand importance of maintaining oxygen saturations>88%;To learn and demonstrate proper pursed lip breathing techniques or other breathing techniques. ;To learn and demonstrate proper use of respiratory medications To learn and understand importance of monitoring SPO2 with pulse oximeter and demonstrate accurate use of the pulse oximeter.;To learn and understand importance of maintaining oxygen saturations>88%;To learn and demonstrate proper pursed lip breathing techniques or other breathing techniques. ;To learn and demonstrate proper use of respiratory medications      Long  Term Goals Verbalizes importance of monitoring SPO2 with pulse oximeter and return demonstration;Maintenance of O2 saturations>88%;Exhibits proper breathing techniques, such as pursed lip breathing or other method taught during program session;Compliance with respiratory medication Verbalizes importance of monitoring SPO2 with pulse oximeter and return demonstration;Maintenance of O2 saturations>88%;Exhibits proper breathing techniques, such as pursed lip breathing or other method taught during program session;Compliance with respiratory medication      Goals/Expected Outcomes compliance compliance               Oxygen Discharge (Final Oxygen Re-Evaluation):  Oxygen Re-Evaluation - 06/16/22 0738       Program Oxygen Prescription   Program Oxygen Prescription None      Home Oxygen   Home Oxygen Device None    Sleep Oxygen Prescription CPAP    Liters per minute 0    Home Exercise Oxygen Prescription None    Home Resting Oxygen Prescription None    Compliance with Home  Oxygen Use No      Goals/Expected Outcomes   Short  Term Goals To learn and understand importance of monitoring SPO2 with pulse oximeter and demonstrate accurate use of the pulse oximeter.;To learn and understand importance of maintaining oxygen saturations>88%;To learn and demonstrate proper pursed lip breathing techniques or other breathing techniques. ;To learn and demonstrate proper use of respiratory medications    Long  Term Goals Verbalizes importance of monitoring SPO2 with pulse oximeter and return demonstration;Maintenance of O2 saturations>88%;Exhibits proper breathing techniques, such as pursed lip breathing or other method taught during program session;Compliance with respiratory medication    Goals/Expected Outcomes compliance             Initial Exercise Prescription:  Initial Exercise Prescription - 04/13/22 0900       Date of Initial Exercise RX and Referring Provider   Date 04/13/22    Referring Provider Dr. Rosaria Ferries    Expected Discharge Date 08/26/22      NuStep   Level 1    SPM 60    Minutes 22      Arm Ergometer   Level 1    RPM 50    Minutes 22      Prescription Details   Frequency (times per week) 2    Duration Progress to 30 minutes of continuous aerobic without signs/symptoms of physical distress      Intensity   THRR 40-80% of Max Heartrate 56-113    Ratings of Perceived Exertion 11-13    Perceived Dyspnea 0-4      Resistance Training   Training Prescription Yes    Weight 3    Reps 10-15             Perform Capillary Blood Glucose checks as needed.  Exercise Prescription Changes:   Exercise Prescription Changes     Row Name 04/29/22 1611 05/11/22 1500 06/01/22 1600 06/10/22 1500       Response to Exercise   Blood Pressure (Admit) 128/60 116/50 100/50 140/60    Blood Pressure (Exercise) 142/68 138/58 100/50 132/58    Blood Pressure (Exit) 124/62 120/58 110/50 120/54    Heart Rate (Admit) 73 bpm 64 bpm 75 bpm 84 bpm    Heart Rate  (Exercise) 78 bpm 72 bpm 75 bpm 72 bpm    Heart Rate (Exit) 71 bpm 69 bpm 72 bpm 70 bpm    Oxygen Saturation (Admit) 99 % 99 % 98 % 93 %    Oxygen Saturation (Exercise) 98 % 99 % 100 % 99 %    Oxygen Saturation (Exit) 99 % 99 % 100 % 98 %    Rating of Perceived Exertion (Exercise) '11 12 12 12    '$ Perceived Dyspnea (Exercise) '12 13 12 12    '$ Duration Continue with 30 min of aerobic exercise without signs/symptoms of physical distress. Continue with 30 min of aerobic exercise without signs/symptoms of physical distress. Continue with 30 min of aerobic exercise without signs/symptoms of physical distress. Continue with 30 min of aerobic exercise without signs/symptoms of physical distress.    Intensity THRR unchanged THRR unchanged THRR unchanged THRR unchanged      Progression   Progression Continue to progress workloads to maintain intensity without signs/symptoms of physical distress. Continue to progress workloads to maintain intensity without signs/symptoms of physical distress. Continue to progress workloads to maintain intensity without signs/symptoms of physical distress. Continue to progress workloads to maintain intensity without signs/symptoms of physical distress.      Resistance Training   Training Prescription Yes Yes Yes Yes    Weight 2 4 4  4    Reps 10-15 10-15 10-15 10-15    Time 10 Minutes 10 Minutes 10 Minutes 10 Minutes      NuStep   Level '1 1 1 2    '$ SPM 62 64 69 63    Minutes '22 22 22 22    '$ METs 1.8 1.8 1.8 1.8      Arm Ergometer   Level '1 1 2 2    '$ RPM 48 41 46 43    Minutes '17 17 17 17    '$ METs 1.4 1.3 1.6 1.5             Exercise Comments:   Exercise Comments     Row Name 04/27/22 1600           Exercise Comments Pt completed first exercise session with no complaints.                Exercise Goals and Review:   Exercise Goals     Row Name 04/13/22 0912 05/19/22 0731 06/16/22 0740         Exercise Goals   Increase Physical Activity Yes  Yes Yes     Intervention Provide advice, education, support and counseling about physical activity/exercise needs.;Develop an individualized exercise prescription for aerobic and resistive training based on initial evaluation findings, risk stratification, comorbidities and participant's personal goals. Provide advice, education, support and counseling about physical activity/exercise needs.;Develop an individualized exercise prescription for aerobic and resistive training based on initial evaluation findings, risk stratification, comorbidities and participant's personal goals. Provide advice, education, support and counseling about physical activity/exercise needs.;Develop an individualized exercise prescription for aerobic and resistive training based on initial evaluation findings, risk stratification, comorbidities and participant's personal goals.     Expected Outcomes Short Term: Attend rehab on a regular basis to increase amount of physical activity.;Long Term: Add in home exercise to make exercise part of routine and to increase amount of physical activity.;Long Term: Exercising regularly at least 3-5 days a week. Short Term: Attend rehab on a regular basis to increase amount of physical activity.;Long Term: Add in home exercise to make exercise part of routine and to increase amount of physical activity.;Long Term: Exercising regularly at least 3-5 days a week. Short Term: Attend rehab on a regular basis to increase amount of physical activity.;Long Term: Add in home exercise to make exercise part of routine and to increase amount of physical activity.;Long Term: Exercising regularly at least 3-5 days a week.     Increase Strength and Stamina Yes Yes Yes     Intervention Provide advice, education, support and counseling about physical activity/exercise needs.;Develop an individualized exercise prescription for aerobic and resistive training based on initial evaluation findings, risk stratification,  comorbidities and participant's personal goals. Provide advice, education, support and counseling about physical activity/exercise needs.;Develop an individualized exercise prescription for aerobic and resistive training based on initial evaluation findings, risk stratification, comorbidities and participant's personal goals. Provide advice, education, support and counseling about physical activity/exercise needs.;Develop an individualized exercise prescription for aerobic and resistive training based on initial evaluation findings, risk stratification, comorbidities and participant's personal goals.     Expected Outcomes Short Term: Increase workloads from initial exercise prescription for resistance, speed, and METs.;Short Term: Perform resistance training exercises routinely during rehab and add in resistance training at home;Long Term: Improve cardiorespiratory fitness, muscular endurance and strength as measured by increased METs and functional capacity (6MWT) Short Term: Increase workloads from initial exercise prescription for resistance, speed, and METs.;Short Term: Perform resistance  training exercises routinely during rehab and add in resistance training at home;Long Term: Improve cardiorespiratory fitness, muscular endurance and strength as measured by increased METs and functional capacity (6MWT) Short Term: Increase workloads from initial exercise prescription for resistance, speed, and METs.;Short Term: Perform resistance training exercises routinely during rehab and add in resistance training at home;Long Term: Improve cardiorespiratory fitness, muscular endurance and strength as measured by increased METs and functional capacity (6MWT)     Able to understand and use rate of perceived exertion (RPE) scale Yes Yes Yes     Intervention Provide education and explanation on how to use RPE scale Provide education and explanation on how to use RPE scale Provide education and explanation on how to use RPE  scale     Expected Outcomes Short Term: Able to use RPE daily in rehab to express subjective intensity level;Long Term:  Able to use RPE to guide intensity level when exercising independently Short Term: Able to use RPE daily in rehab to express subjective intensity level;Long Term:  Able to use RPE to guide intensity level when exercising independently Short Term: Able to use RPE daily in rehab to express subjective intensity level;Long Term:  Able to use RPE to guide intensity level when exercising independently     Able to understand and use Dyspnea scale Yes Yes Yes     Intervention Provide education and explanation on how to use Dyspnea scale Provide education and explanation on how to use Dyspnea scale Provide education and explanation on how to use Dyspnea scale     Expected Outcomes Short Term: Able to use Dyspnea scale daily in rehab to express subjective sense of shortness of breath during exertion;Long Term: Able to use Dyspnea scale to guide intensity level when exercising independently Short Term: Able to use Dyspnea scale daily in rehab to express subjective sense of shortness of breath during exertion;Long Term: Able to use Dyspnea scale to guide intensity level when exercising independently Short Term: Able to use Dyspnea scale daily in rehab to express subjective sense of shortness of breath during exertion;Long Term: Able to use Dyspnea scale to guide intensity level when exercising independently     Knowledge and understanding of Target Heart Rate Range (THRR) Yes Yes Yes     Intervention Provide education and explanation of THRR including how the numbers were predicted and where they are located for reference Provide education and explanation of THRR including how the numbers were predicted and where they are located for reference Provide education and explanation of THRR including how the numbers were predicted and where they are located for reference     Expected Outcomes Short Term:  Able to state/look up THRR;Long Term: Able to use THRR to govern intensity when exercising independently;Short Term: Able to use daily as guideline for intensity in rehab Short Term: Able to state/look up THRR;Long Term: Able to use THRR to govern intensity when exercising independently;Short Term: Able to use daily as guideline for intensity in rehab Short Term: Able to state/look up THRR;Long Term: Able to use THRR to govern intensity when exercising independently;Short Term: Able to use daily as guideline for intensity in rehab     Understanding of Exercise Prescription Yes Yes Yes     Intervention Provide education, explanation, and written materials on patient's individual exercise prescription Provide education, explanation, and written materials on patient's individual exercise prescription Provide education, explanation, and written materials on patient's individual exercise prescription     Expected Outcomes Short Term: Able to  explain program exercise prescription;Long Term: Able to explain home exercise prescription to exercise independently Short Term: Able to explain program exercise prescription;Long Term: Able to explain home exercise prescription to exercise independently Short Term: Able to explain program exercise prescription;Long Term: Able to explain home exercise prescription to exercise independently              Exercise Goals Re-Evaluation :  Exercise Goals Re-Evaluation     Row Name 04/20/22 1407 05/19/22 0732 06/16/22 0741         Exercise Goal Re-Evaluation   Exercise Goals Review Increase Physical Activity;Increase Strength and Stamina;Able to understand and use rate of perceived exertion (RPE) scale;Able to understand and use Dyspnea scale;Knowledge and understanding of Target Heart Rate Range (THRR);Understanding of Exercise Prescription Increase Physical Activity;Increase Strength and Stamina;Able to understand and use rate of perceived exertion (RPE) scale;Able to  understand and use Dyspnea scale;Knowledge and understanding of Target Heart Rate Range (THRR);Understanding of Exercise Prescription Increase Physical Activity;Increase Strength and Stamina;Able to understand and use rate of perceived exertion (RPE) scale;Able to understand and use Dyspnea scale;Knowledge and understanding of Target Heart Rate Range (THRR);Understanding of Exercise Prescription     Comments Pt has not started pulmonary rehab due to classes being at capacity. Will evaluate when he startes. Pt has completed 3 exercise sessions of pulmonary rehab. His wife is pushing him to do the class, and he was originally not motivated to participate. He has seemed to enjoy the class the few times that he has been here. He is currently having trouble with transportation and it is unclear if he will be able to continue. He is severely deconditioned, so progress will likely be slow. He is currently exercising at 1.8 METs on the stepper. Will continue to monitor and progress as able. Pt has completed 9 exercise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

## 2022-06-17 ENCOUNTER — Encounter (HOSPITAL_COMMUNITY)
Admission: RE | Admit: 2022-06-17 | Discharge: 2022-06-17 | Disposition: A | Discharge status: Home / Self Care | Payer: No Typology Code available for payment source | Source: Ambulatory Visit | Attending: Critical Care Medicine | Admitting: Critical Care Medicine

## 2022-06-17 DIAGNOSIS — J449 Chronic obstructive pulmonary disease, unspecified: Secondary | ICD-10-CM

## 2022-06-17 NOTE — Progress Notes (Signed)
Daily Session Note  Patient Details  Name: [name] MRN: [id number] Date of Birth: [birth year] Referring Provider:   Flowsheet Row PULMONARY REHAB COPD ORIENTATION from 04/13/2022 in [place]  Referring Provider Dr. Rosaria Ferries       Encounter Date: 06/17/2022  Check In:  Session Check In - 06/17/22 1445       Check-In   Supervising physician immediately available to respond to emergencies [facility] MD immediately available    Physician(s) Dr. Johnsie Cancel    Location [facility]    Staff Present Geanie Cooley, RN;Debra Wynetta Emery, RN, Bjorn Loser, MS, ACSM-CEP, Exercise Physiologist    Virtual Visit No    Medication changes reported     No    Fall or balance concerns reported    Yes    Comments He has fallen several times over the past couple of years. 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    Tobacco Cessation No Change    Warm-up and Cool-down Performed as group-led instruction    Resistance Training Performed Yes    VAD Patient? No    PAD/SET Patient? No      Pain Assessment   Currently in Pain? No/denies    Pain Score 0-No pain    Multiple Pain Sites No             Capillary Blood Glucose: No results found for this or any previous visit (from the past 24 hour(s)).    Social History   Tobacco Use  Smoking Status Former   Packs/day: 1.00   Years: 20.00   Total pack years: 20.00   Types: Cigarettes   Quit date: 58   Years since quitting: 40.6  Smokeless Tobacco Never    Goals Met:  Proper associated with RPD/PD & O2 Sat Improved SOB with ADL's Exercise tolerated well No report of concerns or symptoms today Strength training completed today  Goals Unmet:  Not Applicable  Comments: check out @ 3:45pm   Dr. Kathie Dike is Medical Director for [facility].

## 2022-06-22 ENCOUNTER — Encounter (HOSPITAL_COMMUNITY): Payer: No Typology Code available for payment source

## 2022-06-24 ENCOUNTER — Encounter (HOSPITAL_COMMUNITY)
Admission: RE | Admit: 2022-06-24 | Discharge: 2022-06-24 | Disposition: A | Discharge status: Home / Self Care | Payer: No Typology Code available for payment source | Source: Ambulatory Visit | Attending: Critical Care Medicine | Admitting: Critical Care Medicine

## 2022-06-24 DIAGNOSIS — J449 Chronic obstructive pulmonary disease, unspecified: Secondary | ICD-10-CM | POA: Diagnosis not present

## 2022-06-24 NOTE — Progress Notes (Addendum)
Daily Session Note  Patient Details  Name: DERELL BRUUN MRN: [id number] Date of Birth: 1941/01/26 Referring Provider:   Flowsheet Row PULMONARY REHAB COPD ORIENTATION from 04/13/2022 in [place]  Referring Provider Dr. Rosaria Ferries       Encounter Date: 06/24/2022  Check In:  Session Check In - 06/24/22 1444       Check-In   Supervising physician immediately available to respond to emergencies [facility] MD immediately available    Physician(s) Dr. Harl Bowie    Location [facility]    Staff Present Redge Gainer, BS, Exercise Physiologist;TRUE Garciamartinez Wynetta Emery, RN, Joanette Gula, RN, BSN;Phyllis Billingsley, RN    Virtual Visit No    Medication changes reported     No    Fall or balance concerns reported    Yes    Comments He has fallen several times over the past couple of years. 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    Tobacco Cessation No Change    Warm-up and Cool-down Performed as group-led instruction    Resistance Training Performed Yes    VAD Patient? No    PAD/SET Patient? No      Pain Assessment   Currently in Pain? No/denies    Pain Score 0-No pain    Multiple Pain Sites No             Capillary Blood Glucose: No results found for this or any previous visit (from the past 24 hour(s)).    Social History   Tobacco Use  Smoking Status Former   Packs/day: 1.00   Years: 20.00   Total pack years: 20.00   Types: Cigarettes   Quit date: 65   Years since quitting: 40.6  Smokeless Tobacco Never    Goals Met:  Proper associated with RPD/PD & O2 Sat Independence with exercise equipment Using PLB without cueing & demonstrates good technique Exercise tolerated well No report of concerns or symptoms today Strength training completed today  Goals Unmet:  Not Applicable  Comments: Check out 1600.   Dr. Kathie Dike is Medical Director for [facility].

## 2022-06-29 ENCOUNTER — Encounter (HOSPITAL_COMMUNITY): Payer: No Typology Code available for payment source

## 2022-06-30 DIAGNOSIS — E1165 Type 2 diabetes mellitus with hyperglycemia: Secondary | ICD-10-CM | POA: Diagnosis not present

## 2022-07-01 ENCOUNTER — Encounter (HOSPITAL_COMMUNITY): Payer: No Typology Code available for payment source

## 2022-07-06 ENCOUNTER — Encounter (HOSPITAL_COMMUNITY): Payer: No Typology Code available for payment source

## 2022-07-08 ENCOUNTER — Encounter (HOSPITAL_COMMUNITY): Payer: No Typology Code available for payment source

## 2022-07-09 DIAGNOSIS — Z299 Encounter for prophylactic measures, unspecified: Secondary | ICD-10-CM | POA: Diagnosis not present

## 2022-07-09 DIAGNOSIS — K5900 Constipation, unspecified: Secondary | ICD-10-CM | POA: Diagnosis not present

## 2022-07-09 DIAGNOSIS — E1165 Type 2 diabetes mellitus with hyperglycemia: Secondary | ICD-10-CM | POA: Diagnosis not present

## 2022-07-09 DIAGNOSIS — Z23 Encounter for immunization: Secondary | ICD-10-CM | POA: Diagnosis not present

## 2022-07-09 DIAGNOSIS — I69359 Hemiplegia and hemiparesis following cerebral infarction affecting unspecified side: Secondary | ICD-10-CM | POA: Diagnosis not present

## 2022-07-09 DIAGNOSIS — I10 Essential (primary) hypertension: Secondary | ICD-10-CM | POA: Diagnosis not present

## 2022-07-10 ENCOUNTER — Encounter (HOSPITAL_COMMUNITY): Payer: Self-pay

## 2022-07-10 ENCOUNTER — Emergency Department (HOSPITAL_COMMUNITY): Payer: No Typology Code available for payment source

## 2022-07-10 ENCOUNTER — Other Ambulatory Visit: Payer: Self-pay

## 2022-07-10 ENCOUNTER — Emergency Department (HOSPITAL_COMMUNITY)
Admission: EM | Admit: 2022-07-10 | Discharge: 2022-07-10 | Disposition: A | Discharge status: Home / Self Care | Payer: No Typology Code available for payment source | Attending: Emergency Medicine | Admitting: Emergency Medicine

## 2022-07-10 DIAGNOSIS — I509 Heart failure, unspecified: Secondary | ICD-10-CM | POA: Diagnosis not present

## 2022-07-10 DIAGNOSIS — K5900 Constipation, unspecified: Secondary | ICD-10-CM | POA: Diagnosis not present

## 2022-07-10 DIAGNOSIS — Z8719 Personal history of other diseases of the digestive system: Secondary | ICD-10-CM | POA: Diagnosis not present

## 2022-07-10 DIAGNOSIS — Z7901 Long term (current) use of anticoagulants: Secondary | ICD-10-CM | POA: Diagnosis not present

## 2022-07-10 DIAGNOSIS — M7989 Other specified soft tissue disorders: Secondary | ICD-10-CM | POA: Diagnosis present

## 2022-07-10 DIAGNOSIS — Z794 Long term (current) use of insulin: Secondary | ICD-10-CM | POA: Insufficient documentation

## 2022-07-10 DIAGNOSIS — R600 Localized edema: Secondary | ICD-10-CM | POA: Diagnosis not present

## 2022-07-10 DIAGNOSIS — N189 Chronic kidney disease, unspecified: Secondary | ICD-10-CM | POA: Diagnosis not present

## 2022-07-10 DIAGNOSIS — R531 Weakness: Secondary | ICD-10-CM | POA: Insufficient documentation

## 2022-07-10 DIAGNOSIS — R609 Edema, unspecified: Secondary | ICD-10-CM

## 2022-07-10 LAB — CBC
HCT: 29.2 % — ABNORMAL LOW (ref 39.0–52.0)
Hemoglobin: 9.3 g/dL — ABNORMAL LOW (ref 13.0–17.0)
MCH: 30.5 pg (ref 26.0–34.0)
MCHC: 31.8 g/dL (ref 30.0–36.0)
MCV: 95.7 fL (ref 80.0–100.0)
Platelets: 210 10*3/uL (ref 150–400)
RBC: 3.05 MIL/uL — ABNORMAL LOW (ref 4.22–5.81)
RDW: 13.5 % (ref 11.5–15.5)
WBC: 5.6 10*3/uL (ref 4.0–10.5)
nRBC: 0 % (ref 0.0–0.2)

## 2022-07-10 LAB — BRAIN NATRIURETIC PEPTIDE: B Natriuretic Peptide: 43 pg/mL (ref 0.0–100.0)

## 2022-07-10 LAB — BASIC METABOLIC PANEL
Anion gap: 6 (ref 5–15)
BUN: 68 mg/dL — ABNORMAL HIGH (ref 8–23)
CO2: 20 mmol/L — ABNORMAL LOW (ref 22–32)
Calcium: 8.4 mg/dL — ABNORMAL LOW (ref 8.9–10.3)
Chloride: 109 mmol/L (ref 98–111)
Creatinine, Ser: 2.79 mg/dL — ABNORMAL HIGH (ref 0.61–1.24)
GFR, Estimated: 22 mL/min — ABNORMAL LOW (ref >60)
Glucose, Bld: 136 mg/dL — ABNORMAL HIGH (ref 70–99)
Potassium: 5.2 mmol/L — ABNORMAL HIGH (ref 3.5–5.1)
Sodium: 135 mmol/L (ref 135–145)

## 2022-07-10 MED ORDER — FUROSEMIDE 10 MG/ML IJ SOLN
80.0000 mg | Freq: Once | INTRAMUSCULAR | Status: AC
Start: 2022-07-10 — End: 2022-07-10
  Administered 2022-07-10: 80 mg via INTRAVENOUS
  Filled 2022-07-10: qty 8

## 2022-07-10 NOTE — ED Notes (Signed)
IV attempt x1 made, IV infiltrated with attempt at blood draw. Patient's hand swelling, patient taking anticoagulants. Ice pack applied to hand. Another nurse in room to attempt IV access.

## 2022-07-10 NOTE — ED Provider Notes (Signed)
[facility] EMERGENCY DEPARTMENT Provider Note   CSN: [id number] Arrival date & time: 07/10/22  1721     History {Add pertinent medical, surgical, social history, OB history to HPI:1} Chief Complaint  Patient presents with   Leg Swelling   Constipation    [name] is a 81 y.o. male.  Pt complains of swelling to his legs.  He has a history of heart failure and kidney disease.   Weakness      Home Medications Prior to Admission medications   Medication Sig Start Date End Date Taking? Authorizing Provider  acetaminophen (TYLENOL) 650 MG CR tablet Take 1,300 mg by mouth every 8 (eight) hours as needed for pain.    [provider]  Adalimumab 40 MG/0.8ML PNKT Inject into the skin. 10/07/20   [provider]  albuterol (VENTOLIN HFA) 108 (90 Base) MCG/ACT inhaler Inhale 1-2 puffs into the lungs every 6 (six) hours as needed for shortness of breath. 03/23/22   [provider]  amLODipine (NORVASC) 10 MG tablet Take 10 mg by mouth daily.    [provider]  apixaban (ELIQUIS) 5 MG TABS tablet Take 1 tablet (5 mg total) by mouth 2 (two) times daily. 04/28/22   Satira Sark, MD  atorvastatin (LIPITOR) 80 MG tablet Take 80 mg by mouth every evening.     [provider]  carboxymethylcellulose (REFRESH PLUS) 0.5 % SOLN Place 1 drop into both eyes 2 (two) times daily as needed for dry eyes. 03/09/22   [provider]  Cyanocobalamin (VITAMIN B 12) 500 MCG TABS Take 1,000 mcg by mouth daily. 03/07/20   Alycia Rossetti, MD  ferrous sulfate 325 (65 FE) MG tablet Take 1 tablet (325 mg total) by mouth daily with breakfast. 05/09/20   Strader, Fransisco Hertz, PA-C  glucose-Vitamin C 4-0.006 GM CHEW chewable tablet Chew 4 tablets by mouth as directed. CHEW FOUR TABLETS BY MOUTH AS DIRECTED BY YOUR PROVIDER (REPEAT EVERY 15 MINUTES IF BLOOD SUGAR LESS THAN 70) 03/09/22   [provider]  insulin aspart protamine- aspart (NOVOLOG MIX 70/30)  (70-30) 100 UNIT/ML injection 42 units in the morning and 50 units at night. Patient taking differently: Inject 40 Units into the skin 2 (two) times daily with a meal. 11/05/20   [address], Modena Nunnery, MD  isosorbide mononitrate (IMDUR) 30 MG 24 hr tablet Take 1 tablet (30 mg total) by mouth every evening. 01/08/22 04/08/22  Satira Sark, MD  losartan (COZAAR) 100 MG tablet Take 100 mg by mouth daily.    [provider]  metFORMIN (GLUCOPHAGE) 1000 MG tablet Take 1,000 mg by mouth 2 (two) times daily with a meal.    [provider]  metoprolol tartrate (LOPRESSOR) 50 MG tablet Take 25 mg by mouth 2 (two) times daily.    [provider]  mycophenolate (CELLCEPT) 500 MG tablet Take 100 mg by mouth in the morning and at bedtime. 03/16/22   [provider]  pioglitazone (ACTOS) 30 MG tablet Take 30 mg by mouth daily. 03/09/22   [provider]  Semaglutide (OZEMPIC, 0.25 OR 0.5 MG/DOSE, [place]) Inject 0.25 mg into the skin once a week. 02/26/22   [provider]  spironolactone (ALDACTONE) 25 MG tablet Take 25 mg by mouth daily.    [provider]  tamsulosin (FLOMAX) 0.4 MG CAPS capsule Take 2 capsules (0.8 mg total) by mouth every evening. 06/09/16   Alycia Rossetti, MD  Tiotropium Bromide-Olodaterol 2.5-2.5 MCG/ACT AERS  Inhale 2 puffs into the lungs daily. 03/23/22   [provider]  torsemide (DEMADEX) 20 MG tablet Take 20 mg daily alternating with 40 mg the next day Patient taking differently: Take 20-40 mg by mouth daily. Take 20 mg daily alternating with 40 mg the next day 07/17/21   Satira Sark, MD      Allergies    Ivp dye [iodinated contrast media]    Review of Systems   Review of Systems  Neurological:  Positive for weakness.    Physical Exam Updated Vital Signs BP (!) 139/38   Pulse 66   Temp 98.4 F (36.9 C) (Oral)   Resp 14   Ht '5\' 8"'$  (1.727 m)   Wt 125.6 kg   SpO2 100%   BMI 42.12 kg/m  Physical  Exam  ED Results / Procedures / Treatments   Labs (all labs ordered are listed, but only abnormal results are displayed) Labs Reviewed  BASIC METABOLIC PANEL - Abnormal; Notable for the following components:      Result Value   Potassium 5.2 (*)    CO2 20 (*)    Glucose, Bld 136 (*)    BUN 68 (*)    Creatinine, Ser 2.79 (*)    Calcium 8.4 (*)    GFR, Estimated 22 (*)    All other components within normal limits  CBC - Abnormal; Notable for the following components:   RBC 3.05 (*)    Hemoglobin 9.3 (*)    HCT 29.2 (*)    All other components within normal limits  BRAIN NATRIURETIC PEPTIDE    EKG None  Radiology DG ABD ACUTE 2+V W 1V CHEST  Result Date: 07/10/2022 CLINICAL DATA:  Constipation and rectal pain. EXAM: DG ABDOMEN ACUTE WITH 1 VIEW CHEST COMPARISON:  Mar 25, 2022 FINDINGS: There is no evidence of dilated bowel loops or free intraperitoneal air. A mild stool burden is seen. No radiopaque calculi or other significant radiographic abnormality is seen. Radiopaque surgical clips are seen within the mid and upper right abdomen. Multiple sternal wires and vascular clips are seen. Heart size and mediastinal contours are within normal limits. Both lungs are clear. IMPRESSION: Negative abdominal radiographs.  No acute cardiopulmonary disease. Electronically Signed   By: Virgina Norfolk M.D.   On: 07/10/2022 19:13    Procedures Procedures  {Document cardiac monitor, telemetry assessment procedure when appropriate:1}  Medications Ordered in ED Medications  furosemide (LASIX) injection 80 mg (80 mg Intravenous Given 07/10/22 1945)    ED Course/ Medical Decision Making/ A&P                           Medical Decision Making Amount and/or Complexity of Data Reviewed Labs: ordered. Radiology: ordered.  Risk Prescription drug management.   Patient with increased edema in both legs.  We will increase his Demadex so you are taking 40 mg a day and he will follow-up with his  nephrologist the beginning of the week  {Document critical care time when appropriate:1} {Document review of labs and clinical decision tools ie heart score, Chads2Vasc2 etc:1}  {Document your independent review of radiology images, and any outside records:1} {Document your discussion with family members, caretakers, and with consultants:1} {Document social determinants of health affecting pt's care:1} {Document your decision making why or why not admission, treatments were needed:1} Final Clinical Impression(s) / ED Diagnoses Final diagnoses:  Leg swelling  Peripheral edema    Rx / DC Orders  ED Discharge Orders     None

## 2022-07-10 NOTE — Discharge Instructions (Signed)
Increase your fluid pill called Demadex so you are taking 2 pills or a total of 40 mg in the morning.  Follow-up with your kidney doctor the beginning of this week

## 2022-07-10 NOTE — ED Triage Notes (Signed)
Pt c/o constipation and rectal pain x "a couple weeks" and BLE swelling increasing x "a few days."  Pain score 8/10.  Hx of CHF.  Pt has been taking iron pills and increased "water pill" w/o relief.

## 2022-07-10 NOTE — ED Notes (Signed)
Pt transferred to recliner with feet up.

## 2022-07-10 NOTE — ED Notes (Signed)
Pt given bedside urinal

## 2022-07-13 ENCOUNTER — Encounter (HOSPITAL_COMMUNITY): Payer: No Typology Code available for payment source

## 2022-07-14 NOTE — Progress Notes (Signed)
Pulmonary Individual Treatment Plan  Patient Details  Name: [name] MRN: [id number] Date of Birth: 1941/01/23 Referring Provider:   Flowsheet Row PULMONARY REHAB COPD ORIENTATION from 04/13/2022 in [facility]  Referring Provider Dr. Rosaria Ferries       Initial Encounter Date:  Flowsheet Row PULMONARY REHAB COPD ORIENTATION from 04/13/2022 in [facility]  Date 04/13/22       Visit Diagnosis: Chronic obstructive pulmonary disease, unspecified COPD type ([place])  Patient's Home Medications on Admission:   Current Outpatient Medications:    acetaminophen (TYLENOL) 650 MG CR tablet, Take 1,300 mg by mouth every 8 (eight) hours as needed for pain., Disp: , Rfl:    Adalimumab 40 MG/0.8ML PNKT, Inject into the skin., Disp: , Rfl:    albuterol (VENTOLIN HFA) 108 (90 Base) MCG/ACT inhaler, Inhale 1-2 puffs into the lungs every 6 (six) hours as needed for shortness of breath., Disp: , Rfl:    amLODipine (NORVASC) 10 MG tablet, Take 10 mg by mouth daily., Disp: , Rfl:    apixaban (ELIQUIS) 5 MG TABS tablet, Take 1 tablet (5 mg total) by mouth 2 (two) times daily., Disp: 14 tablet, Rfl: 0   atorvastatin (LIPITOR) 80 MG tablet, Take 80 mg by mouth every evening. , Disp: , Rfl:    carboxymethylcellulose (REFRESH PLUS) 0.5 % SOLN, Place 1 drop into both eyes 2 (two) times daily as needed for dry eyes., Disp: , Rfl:    Cyanocobalamin (VITAMIN B 12) 500 MCG TABS, Take 1,000 mcg by mouth daily., Disp: 180 tablet, Rfl: 1   ferrous sulfate 325 (65 FE) MG tablet, Take 1 tablet (325 mg total) by mouth daily with breakfast., Disp: 90 tablet, Rfl: 3   glucose-Vitamin C 4-0.006 GM CHEW chewable tablet, Chew 4 tablets by mouth as directed. CHEW FOUR TABLETS BY MOUTH AS DIRECTED BY YOUR PROVIDER (REPEAT EVERY 15 MINUTES IF BLOOD SUGAR LESS THAN 70), Disp: , Rfl:    insulin aspart protamine- aspart (NOVOLOG MIX 70/30) (70-30) 100 UNIT/ML injection, 42 units in the morning  and 50 units at night. (Patient taking differently: Inject 40 Units into the skin 2 (two) times daily with a meal.), Disp: 10 mL, Rfl: 0   isosorbide mononitrate (IMDUR) 30 MG 24 hr tablet, Take 1 tablet (30 mg total) by mouth every evening., Disp: 90 tablet, Rfl: 3   losartan (COZAAR) 100 MG tablet, Take 100 mg by mouth daily., Disp: , Rfl:    metFORMIN (GLUCOPHAGE) 1000 MG tablet, Take 1,000 mg by mouth 2 (two) times daily with a meal., Disp: , Rfl:    metoprolol tartrate (LOPRESSOR) 50 MG tablet, Take 25 mg by mouth 2 (two) times daily., Disp: , Rfl:    mycophenolate (CELLCEPT) 500 MG tablet, Take 100 mg by mouth in the morning and at bedtime., Disp: , Rfl:    pioglitazone (ACTOS) 30 MG tablet, Take 30 mg by mouth daily., Disp: , Rfl:    Semaglutide (OZEMPIC, 0.25 OR 0.5 MG/DOSE, [employer]), Inject 0.25 mg into the skin once a week., Disp: , Rfl:    spironolactone (ALDACTONE) 25 MG tablet, Take 25 mg by mouth daily., Disp: , Rfl:    tamsulosin (FLOMAX) 0.4 MG CAPS capsule, Take 2 capsules (0.8 mg total) by mouth every evening., Disp: 60 capsule, Rfl: 3   Tiotropium Bromide-Olodaterol 2.5-2.5 MCG/ACT AERS, Inhale 2 puffs into the lungs daily., Disp: , Rfl:    torsemide (DEMADEX) 20 MG tablet, Take 20 mg daily alternating with 40  mg the next day (Patient taking differently: Take 20-40 mg by mouth daily. Take 20 mg daily alternating with 40 mg the next day), Disp: 135 tablet, Rfl: 3  Past Medical History: Past Medical History:  Diagnosis Date   Anemia    C. 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/2011   GERD (gastroesophageal reflux disease)    Glaucoma    Hyperlipidemia    Hypertension    Morbid obesity ([place])    PAF (paroxysmal atrial fibrillation) ([place])    Peripheral vascular disease ([place])    Sleep apnea     Stroke ([place]) 2008, 2014, 2015    Tobacco Use: Social History   Tobacco Use  Smoking Status Former   Packs/day: 1.00   Years: 20.00   Total pack years: 20.00   Types: Cigarettes   Quit date: 59   Years since quitting: 40.7  Smokeless Tobacco Never    Labs: Review Flowsheet  More data exists      Latest Ref Rng & Units 06/08/2019 03/07/2020 07/08/2020 08/16/2020 05/13/2022  Labs for ITP Cardiac and Pulmonary Rehab  Cholestrol 0 - 200 mg/dL - 154  - 128  -  LDL (calc) 0 - 99 mg/dL - 98  - 76  -  HDL-C >40 mg/dL - 35  - 30  -  Trlycerides <150 mg/dL - 118  - 110  -  Hemoglobin A1c 4.8 - 5.6 % 7.6  8.2  8.3  8.4  7.8     Capillary Blood Glucose: Lab Results  Component Value Date   GLUCAP 123 (H) 05/11/2022   GLUCAP 157 (H) 04/29/2022   GLUCAP 178 (H) 04/27/2022   GLUCAP 214 (H) 08/16/2020   GLUCAP 177 (H) 05/03/2020    POCT Glucose     Row Name 04/13/22 0831             POCT Blood Glucose   Pre-Exercise 266 mg/dL                Pulmonary Assessment Scores:  Pulmonary Assessment Scores     Row Name 04/13/22 0837         ADL UCSD   SOB Score total 84     Rest 1     Walk 3     Stairs 5     Bath 0     Dress 3     Shop 5       CAT Score   CAT Score 22       mMRC Score   mMRC Score 4             UCSD: Self-administered rating of dyspnea associated with activities of daily living (ADLs) 6-point scale (0 = "not at all" to 5 = "maximal or unable to do because of breathlessness")  Scoring Scores range from 0 to 120.  Minimally important difference is 5 units  CAT: CAT can identify the health impairment of COPD patients and is better correlated with disease progression.  CAT has a scoring range of zero to 40. The CAT score is classified into four groups of low (less than 10), medium (10 - 20), high (21-30) and very high (31-40) based on the impact level of disease on health status. A CAT score over 10 suggests significant symptoms.  A  worsening CAT  score could be explained by an exacerbation, poor medication adherence, poor inhaler technique, or progression of COPD or comorbid conditions.  CAT MCID is 2 points  mMRC: mMRC (Modified Medical Research Council) Dyspnea Scale is used to assess the degree of baseline functional disability in patients of respiratory disease due to dyspnea. No minimal important difference is established. A decrease in score of 1 point or greater is considered a positive change.   Pulmonary Function Assessment:   Exercise Target Goals: Exercise Program Goal: Individual exercise prescription set using results from initial 6 min walk test and THRR while considering  patient's activity barriers and safety.   Exercise Prescription Goal: Initial exercise prescription builds to 30-45 minutes a day of aerobic activity, 2-3 days per week.  Home exercise guidelines will be given to patient during program as part of exercise prescription that the participant will acknowledge.  Activity Barriers & Risk Stratification:  Activity Barriers & Cardiac Risk Stratification - 04/13/22 0908       Activity Barriers & Cardiac Risk Stratification   Activity Barriers Back Problems;Deconditioning;Shortness of Breath;Balance Concerns;History of Falls;Assistive Device    Cardiac Risk Stratification High             6 Minute Walk:  6 Minute Walk     Row Name 04/13/22 0908         6 Minute Walk   Phase Initial     Distance 400 feet     Walk Time 6 minutes     # of Rest Breaks 1     MPH 0.75     METS -0.2     RPE 13     Perceived Dyspnea  13     VO2 Peak -0.73     Symptoms Yes (comment)     Comments One sitting break 40 minutes due to weakness in leg     Resting HR 64 bpm     Resting BP 122/50     Resting Oxygen Saturation  98 %     Exercise Oxygen Saturation  during 6 min walk 97 %     Max Ex. HR 82 bpm     Max Ex. BP 136/58     2 Minute Post BP 118/50       Interval HR   1 Minute HR 79     2 Minute HR 81      3 Minute HR 82     4 Minute HR 76     5 Minute HR 82     6 Minute HR 82     2 Minute Post HR 70     Interval Heart Rate? Yes       Interval Oxygen   Interval Oxygen? Yes     Baseline Oxygen Saturation % 98 %     1 Minute Oxygen Saturation % 97 %     1 Minute Liters of Oxygen 0 L     2 Minute Oxygen Saturation % 98 %     2 Minute Liters of Oxygen 0 L     3 Minute Oxygen Saturation % 97 %     3 Minute Liters of Oxygen 0 L     4 Minute Oxygen Saturation % 98 %     4 Minute Liters of Oxygen 0 L     5 Minute Oxygen Saturation % 98 %     5 Minute Liters of Oxygen 0 L     6 Minute Oxygen Saturation %  97 %     6 Minute Liters of Oxygen 0 L     2 Minute Post Oxygen Saturation % 97 %     2 Minute Post Liters of Oxygen 0 L              Oxygen Initial Assessment:  Oxygen Initial Assessment - 04/13/22 0857       Initial 6 min Walk   Oxygen Used None      Program Oxygen Prescription   Program Oxygen Prescription None      Intervention   Short Term Goals To learn and understand importance of monitoring SPO2 with pulse oximeter and demonstrate accurate use of the pulse oximeter.;To learn and understand importance of maintaining oxygen saturations>88%;To learn and demonstrate proper pursed lip breathing techniques or other breathing techniques. ;To learn and demonstrate proper use of respiratory medications    Long  Term Goals Verbalizes importance of monitoring SPO2 with pulse oximeter and return demonstration;Maintenance of O2 saturations>88%;Exhibits proper breathing techniques, such as pursed lip breathing or other method taught during program session;Compliance with respiratory medication             Oxygen Re-Evaluation:  Oxygen Re-Evaluation     Row Name 05/19/22 0729 06/16/22 0738 07/13/22 1531         Program Oxygen Prescription   Program Oxygen Prescription None None None       Home Oxygen   Home Oxygen Device None None None     Sleep Oxygen Prescription  CPAP CPAP CPAP     Liters per minute 0 0 0     Home Exercise Oxygen Prescription None None None     Home Resting Oxygen Prescription None None None     Compliance with Home Oxygen Use No No No       Goals/Expected Outcomes   Short Term Goals To learn and understand importance of monitoring SPO2 with pulse oximeter and demonstrate accurate use of the pulse oximeter.;To learn and understand importance of maintaining oxygen saturations>88%;To learn and demonstrate proper pursed lip breathing techniques or other breathing techniques. ;To learn and demonstrate proper use of respiratory medications To learn and understand importance of monitoring SPO2 with pulse oximeter and demonstrate accurate use of the pulse oximeter.;To learn and understand importance of maintaining oxygen saturations>88%;To learn and demonstrate proper pursed lip breathing techniques or other breathing techniques. ;To learn and demonstrate proper use of respiratory medications To learn and understand importance of monitoring SPO2 with pulse oximeter and demonstrate accurate use of the pulse oximeter.;To learn and understand importance of maintaining oxygen saturations>88%;To learn and demonstrate proper pursed lip breathing techniques or other breathing techniques. ;To learn and demonstrate proper use of respiratory medications     Long  Term Goals Verbalizes importance of monitoring SPO2 with pulse oximeter and return demonstration;Maintenance of O2 saturations>88%;Exhibits proper breathing techniques, such as pursed lip breathing or other method taught during program session;Compliance with respiratory medication Verbalizes importance of monitoring SPO2 with pulse oximeter and return demonstration;Maintenance of O2 saturations>88%;Exhibits proper breathing techniques, such as pursed lip breathing or other method taught during program session;Compliance with respiratory medication Verbalizes importance of monitoring SPO2 with pulse oximeter  and return demonstration;Maintenance of O2 saturations>88%;Exhibits proper breathing techniques, such as pursed lip breathing or other method taught during program session;Compliance with respiratory medication     Goals/Expected Outcomes compliance compliance compliance              Oxygen Discharge (Final Oxygen Re-Evaluation):  Oxygen Re-Evaluation - 07/13/22  [place]   Program Oxygen Prescription None      Home Oxygen   Home Oxygen Device None    Sleep Oxygen Prescription CPAP    Liters per minute 0    Home Exercise Oxygen Prescription None    Home Resting Oxygen Prescription None    Compliance with Home Oxygen Use No      Goals/Expected Outcomes   Short Term Goals To learn and understand importance of monitoring SPO2 with pulse oximeter and demonstrate accurate use of the pulse oximeter.;To learn and understand importance of maintaining oxygen saturations>88%;To learn and demonstrate proper pursed lip breathing techniques or other breathing techniques. ;To learn and demonstrate proper use of respiratory medications    Long  Term Goals Verbalizes importance of monitoring SPO2 with pulse oximeter and return demonstration;Maintenance of O2 saturations>88%;Exhibits proper breathing techniques, such as pursed lip breathing or other method taught during program session;Compliance with respiratory medication    Goals/Expected Outcomes compliance             Initial Exercise Prescription:  Initial Exercise Prescription - 04/13/22 0900       Date of Initial Exercise RX and Referring Provider   Date 04/13/22    Referring Provider Dr. Rosaria Ferries    Expected Discharge Date 08/26/22      NuStep   Level 1    SPM 60    Minutes 22      Arm Ergometer   Level 1    RPM 50    Minutes 22      Prescription Details   Frequency (times per week) 2    Duration Progress to 30 minutes of continuous aerobic without signs/symptoms of physical distress       Intensity   THRR 40-80% of Max Heartrate 56-113    Ratings of Perceived Exertion 11-13    Perceived Dyspnea 0-4      Resistance Training   Training Prescription Yes    Weight 3    Reps 10-15             Perform Capillary Blood Glucose checks as needed.  Exercise Prescription Changes:   Exercise Prescription Changes     Row Name 04/29/22 1611 05/11/22 1500 06/01/22 1600 06/10/22 1500 06/24/22 1500     Response to Exercise   Blood Pressure (Admit) 128/60 116/50 100/50 140/60 126/50   Blood Pressure (Exercise) 142/68 138/58 100/50 132/58 132/60   Blood Pressure (Exit) 124/62 120/58 110/50 120/54 120/50   Heart Rate (Admit) 73 bpm 64 bpm 75 bpm 84 bpm 73 bpm   Heart Rate (Exercise) 78 bpm 72 bpm 75 bpm 72 bpm 78 bpm   Heart Rate (Exit) 71 bpm 69 bpm 72 bpm 70 bpm 69 bpm   Oxygen Saturation (Admit) 99 % 99 % 98 % 93 % 100 %   Oxygen Saturation (Exercise) 98 % 99 % 100 % 99 % 100 %   Oxygen Saturation (Exit) 99 % 99 % 100 % 98 % 100 %   Rating of Perceived Exertion (Exercise) '11 12 12 12 12   '$ Perceived Dyspnea (Exercise) '12 13 12 12 12   '$ Duration Continue with 30 min of aerobic exercise without signs/symptoms of physical distress. Continue with 30 min of aerobic exercise without signs/symptoms of physical distress. Continue with 30 min of aerobic exercise without signs/symptoms of physical distress. Continue with 30 min of aerobic exercise without signs/symptoms of physical distress. Continue with 30 min of aerobic exercise  without signs/symptoms of physical distress.   Intensity THRR unchanged THRR unchanged THRR unchanged THRR unchanged THRR unchanged     Progression   Progression Continue to progress workloads to maintain intensity without signs/symptoms of physical distress. Continue to progress workloads to maintain intensity without signs/symptoms of physical distress. Continue to progress workloads to maintain intensity without signs/symptoms of physical distress. Continue to  progress workloads to maintain intensity without signs/symptoms of physical distress. Continue to progress workloads to maintain intensity without signs/symptoms of physical distress.     Resistance Training   Training Prescription Yes Yes Yes Yes Yes   Weight '2 4 4 4 4   '$ Reps 10-15 10-15 10-15 10-15 10-15   Time 10 Minutes 10 Minutes 10 Minutes 10 Minutes 10 Minutes     NuStep   Level '1 1 1 2 2   '$ SPM 62 64 69 63 65   Minutes '22 22 22 22 17   '$ METs 1.8 1.8 1.8 1.8 1.8     Arm Ergometer   Level '1 1 2 2 2   '$ RPM 48 41 46 43 48   Minutes '17 17 17 17 22   '$ METs 1.4 1.3 1.6 1.5 1.8            Exercise Comments:   Exercise Comments     Row Name 04/27/22 1600           Exercise Comments Pt completed first exercise session with no complaints.                Exercise Goals and Review:   Exercise Goals     Row Name 04/13/22 0912 05/19/22 0731 06/16/22 0740 07/13/22 1529       Exercise Goals   Increase Physical Activity Yes Yes Yes Yes    Intervention Provide advice, education, support and counseling about physical activity/exercise needs.;Develop an individualized exercise prescription for aerobic and resistive training based on initial evaluation findings, risk stratification, comorbidities and participant's personal goals. Provide advice, education, support and counseling about physical activity/exercise needs.;Develop an individualized exercise prescription for aerobic and resistive training based on initial evaluation findings, risk stratification, comorbidities and participant's personal goals. Provide advice, education, support and counseling about physical activity/exercise needs.;Develop an individualized exercise prescription for aerobic and resistive training based on initial evaluation findings, risk stratification, comorbidities and participant's personal goals. Provide advice, education, support and counseling about physical activity/exercise needs.;Develop an  individualized exercise prescription for aerobic and resistive training based on initial evaluation findings, risk stratification, comorbidities and participant's personal goals.    Expected Outcomes Short Term: Attend rehab on a regular basis to increase amount of physical activity.;Long Term: Add in home exercise to make exercise part of routine and to increase amount of physical activity.;Long Term: Exercising regularly at least 3-5 days a week. Short Term: Attend rehab on a regular basis to increase amount of physical activity.;Long Term: Add in home exercise to make exercise part of routine and to increase amount of physical activity.;Long Term: Exercising regularly at least 3-5 days a week. Short Term: Attend rehab on a regular basis to increase amount of physical activity.;Long Term: Add in home exercise to make exercise part of routine and to increase amount of physical activity.;Long Term: Exercising regularly at least 3-5 days a week. Short Term: Attend rehab on a regular basis to increase amount of physical activity.;Long Term: Add in home exercise to make exercise part of routine and to increase amount of physical activity.;Long Term: Exercising regularly at least 3-5  days a week.    Increase Strength and Stamina Yes Yes Yes Yes    Intervention Provide advice, education, support and counseling about physical activity/exercise needs.;Develop an individualized exercise prescription for aerobic and resistive training based on initial evaluation findings, risk stratification, comorbidities and participant's personal goals. Provide advice, education, support and counseling about physical activity/exercise needs.;Develop an individualized exercise prescription for aerobic and resistive training based on initial evaluation findings, risk stratification, comorbidities and participant's personal goals. Provide advice, education, support and counseling about physical activity/exercise needs.;Develop an  individualized exercise prescription for aerobic and resistive training based on initial evaluation findings, risk stratification, comorbidities and participant's personal goals. Provide advice, education, support and counseling about physical activity/exercise needs.;Develop an individualized exercise prescription for aerobic and resistive training based on initial evaluation findings, risk stratification, comorbidities and participant's personal goals.    Expected Outcomes Short Term: Increase workloads from initial exercise prescription for resistance, speed, and METs.;Short Term: Perform resistance training exercises routinely during rehab and add in resistance training at home;Long Term: Improve cardiorespiratory fitness, muscular endurance and strength as measured by increased METs and functional capacity (6MWT) Short Term: Increase workloads from initial exercise prescription for resistance, speed, and METs.;Short Term: Perform resistance training exercises routinely during rehab and add in resistance training at home;Long Term: Improve cardiorespiratory fitness, muscular endurance and strength as measured by increased METs and functional capacity (6MWT) Short Term: Increase workloads from initial exercise prescription for resistance, speed, and METs.;Short Term: Perform resistance training exercises routinely during rehab and add in resistance training at home;Long Term: Improve cardiorespiratory fitness, muscular endurance and strength as measured by increased METs and functional capacity (6MWT) Short Term: Increase workloads from initial exercise prescription for resistance, speed, and METs.;Short Term: Perform resistance training exercises routinely during rehab and add in resistance training at home;Long Term: Improve cardiorespiratory fitness, muscular endurance and strength as measured by increased METs and functional capacity (6MWT)    Able to understand and use rate of perceived exertion (RPE)  scale Yes Yes Yes Yes    Intervention Provide education and explanation on how to use RPE scale Provide education and explanation on how to use RPE scale Provide education and explanation on how to use RPE scale Provide education and explanation on how to use RPE scale    Expected Outcomes Short Term: Able to use RPE daily in rehab to express subjective intensity level;Long Term:  Able to use RPE to guide intensity level when exercising independently Short Term: Able to use RPE daily in rehab to express subjective intensity level;Long Term:  Able to use RPE to guide intensity level when exercising independently Short Term: Able to use RPE daily in rehab to express subjective intensity level;Long Term:  Able to use RPE to guide intensity level when exercising independently Short Term: Able to use RPE daily in rehab to express subjective intensity level;Long Term:  Able to use RPE to guide intensity level when exercising independently    Able to understand and use Dyspnea scale Yes Yes Yes Yes    Intervention Provide education and explanation on how to use Dyspnea scale Provide education and explanation on how to use Dyspnea scale Provide education and explanation on how to use Dyspnea scale Provide education and explanation on how to use Dyspnea scale    Expected Outcomes Short Term: Able to use Dyspnea scale daily in rehab to express subjective sense of shortness of breath during exertion;Long Term: Able to use Dyspnea scale to guide intensity level when exercising independently  Short Term: Able to use Dyspnea scale daily in rehab to express subjective sense of shortness of breath during exertion;Long Term: Able to use Dyspnea scale to guide intensity level when exercising independently Short Term: Able to use Dyspnea scale daily in rehab to express subjective sense of shortness of breath during exertion;Long Term: Able to use Dyspnea scale to guide intensity level when exercising independently Short Term: Able  to use Dyspnea scale daily in rehab to express subjective sense of shortness of breath during exertion;Long Term: Able to use Dyspnea scale to guide intensity level when exercising independently    Knowledge and understanding of Target Heart Rate Range (THRR) Yes Yes Yes Yes    Intervention Provide education and explanation of THRR including how the numbers were predicted and where they are located for reference Provide education and explanation of THRR including how the numbers were predicted and where they are located for reference Provide education and explanation of THRR including how the numbers were predicted and where they are located for reference Provide education and explanation of THRR including how the numbers were predicted and where they are located for reference    Expected Outcomes Short Term: Able to state/look up THRR;Long Term: Able to use THRR to govern intensity when exercising independently;Short Term: Able to use daily as guideline for intensity in rehab Short Term: Able to state/look up THRR;Long Term: Able to use THRR to govern intensity when exercising independently;Short Term: Able to use daily as guideline for intensity in rehab Short Term: Able to state/look up THRR;Long Term: Able to use THRR to govern intensity when exercising independently;Short Term: Able to use daily as guideline for intensity in rehab Short Term: Able to state/look up THRR;Long Term: Able to use THRR to govern intensity when exercising independently;Short Term: Able to use daily as guideline for intensity in rehab    Understanding of Exercise Prescription Yes Yes Yes Yes    Intervention Provide education, explanation, and written materials on patient's individual exercise prescription Provide education, explanation, and written materials on patient's individual exercise prescription Provide education, explanation, and written materials on patient's individual exercise prescription Provide education,  explanation, and written materials on patient's individual exercise prescription    Expected Outcomes Short Term: Able to explain program exercise prescription;Long Term: Able to explain home exercise prescription to exercise independently Short Term: Able to explain program exercise prescription;Long Term: Able to explain home exercise prescription to exercise independently Short Term: Able to explain program exercise prescription;Long Term: Able to explain home exercise prescription to exercise independently Short Term: Able to explain program exercise prescription;Long Term: Able to explain home exercise prescription to exercise independently             Exercise Goals Re-Evaluation :  Exercise Goals Re-Evaluation     Row Name 04/20/22 1407 05/19/22 0732 06/16/22 0741 07/13/22 1530       Exercise Goal Re-Evaluation   Exercise Goals Review Increase Physical Activity;Increase Strength and Stamina;Able to understand and use rate of perceived exertion (RPE) scale;Able to understand and use Dyspnea scale;Knowledge and understanding of Target Heart Rate Range (THRR);Understanding of Exercise Prescription Increase Physical Activity;Increase Strength and Stamina;Able to understand and use rate of perceived exertion (RPE) scale;Able to understand and use Dyspnea scale;Knowledge and understanding of Target Heart Rate Range (THRR);Understanding of Exercise Prescription Increase Physical Activity;Increase Strength and Stamina;Able to understand and use rate of perceived exertion (RPE) scale;Able to understand and use Dyspnea scale;Knowledge and understanding of Target Heart Rate Range (THRR);Understanding of Exercise  Prescription Increase Physical Activity;Increase Strength and Stamina;Able to understand and use rate of perceived exertion (RPE) scale;Able to understand and use Dyspnea scale;Knowledge and understanding of Target Heart Rate Range (THRR);Understanding of Exercise Prescription    Comments Pt has  not started pulmonary rehab due to classes being at capacity. Will evaluate when he startes. Pt has completed 3 exercise sessions of pulmonary rehab. His wife is pushing him to do the class, and he was originally not motivated to participate. He has seemed to enjoy the class the few times that he has been here. He is currently having trouble with transportation and it is unclear if he will be able to continue. He is severely deconditioned, so progress will likely be slow. He is currently exercising at 1.8 METs on the stepper. Will continue to monitor and progress as able. Pt has completed 9 exercise sessions of PR. His attendance has been inconsistent due to his transportation situation. He does push himself while he is in class, and his motivation levels seem to be improving. He continues to be severely deconditioned, so progress continues to be slow. He is currently exercising at 1.8 METs on the stepper. Will continue to monitor and progress as able. Pt has completed 11 sessions of PR. His last session showed is 8/24. He has been no call no show since. Will update when pt calls back.    Expected Outcomes Through exercising at rehab and at home patient will reach their goals. Through exercising at rehab and at home patient will reach their goals. Through exercising at rehab and at home patient will reach their goals. Through exercising at rehab and at home patient will reach their goals.             Discharge Exercise Prescription (Final Exercise Prescription Changes):  Exercise Prescription Changes - 06/24/22 1500       Response to Exercise   Blood Pressure (Admit) 126/50    Blood Pressure (Exercise) 132/60    Blood Pressure (Exit) 120/50    Heart Rate (Admit) 73 bpm    Heart Rate (Exercise) 78 bpm    Heart Rate (Exit) 69 bpm    Oxygen Saturation (Admit) 100 %    Oxygen Saturation (Exercise) 100 %    Oxygen Saturation (Exit) 100 %    Rating of Perceived Exertion (Exercise) 12    Perceived  Dyspnea (Exercise) 12    Duration Continue with 30 min of aerobic exercise without signs/symptoms of physical distress.    Intensity THRR unchanged      Progression   Progression Continue to progress workloads to maintain intensity without signs/symptoms of physical distress.      Resistance Training   Training Prescription Yes    Weight 4    Reps 10-15    Time 10 Minutes      NuStep   Level 2    SPM 65    Minutes 17    METs 1.8      Arm Ergometer   Level 2    RPM 48    Minutes 22    METs 1.8             Nutrition:  Target Goals: Understanding of nutrition guidelines, daily intake of sodium '1500mg'$ , cholesterol '200mg'$ , calories 30% from fat and 7% or less from saturated fats, daily to have 5 or more servings of fruits and vegetables.  Biometrics:  Pre Biometrics - 04/13/22 0913       Pre Biometrics   Height '5\' 8"'$  (  1.727 m)    Weight 128.2 kg    Waist Circumference 53 inches    Hip Circumference 48 inches    Waist to Hip Ratio 1.1 %    BMI (Calculated) 42.98    Triceps Skinfold 18 mm    % Body Fat 40.7 %    Grip Strength 20.5 kg    Flexibility 0 in    Single Leg Stand 0 seconds              Nutrition Therapy Plan and Nutrition Goals:  Nutrition Therapy & Goals - 05/12/22 1335       Personal Nutrition Goals   Comments Patient weight this week was 129.8kg. We will continue to encourage heart healthy diet and provide him with 2 educational handouts and assistance with RD referreal if patient is interested.      Intervention Plan   Intervention Nutrition handout(s) given to patient.    Expected Outcomes Short Term Goal: Understand basic principles of dietary content, such as calories, fat, sodium, cholesterol and nutrients.             Nutrition Assessments:  Nutrition Assessments - 04/13/22 0846       MEDFICTS Scores   Pre Score 32            MEDIFICTS Score Key: ?70 Need to make dietary changes  40-70 Heart Healthy Diet ? 40  Therapeutic Level Cholesterol Diet   Picture Your Plate Scores: <50 Unhealthy dietary pattern with much room for improvement. 41-50 Dietary pattern unlikely to meet recommendations for good health and room for improvement. 51-60 More healthful dietary pattern, with some room for improvement.  >60 Healthy dietary pattern, although there may be some specific behaviors that could be improved.    Nutrition Goals Re-Evaluation:   Nutrition Goals Discharge (Final Nutrition Goals Re-Evaluation):   Psychosocial: Target Goals: Acknowledge presence or absence of significant depression and/or stress, maximize coping skills, provide positive support system. Participant is able to verbalize types and ability to use techniques and skills needed for reducing stress and depression.  Initial Review & Psychosocial Screening:  Initial Psych Review & Screening - 04/13/22 0909       Initial Review   Current issues with None Identified      Family Dynamics   Good Support System? Yes    Comments His support system includes his wife and his daughter.      Barriers   Psychosocial barriers to participate in program There are no identifiable barriers or psychosocial needs.      Screening Interventions   Interventions Encouraged to exercise    Expected Outcomes Long Term goal: The participant improves quality of Life and PHQ9 Scores as seen by post scores and/or verbalization of changes;Short Term goal: Identification and review with participant of any Quality of Life or Depression concerns found by scoring the questionnaire.             Quality of Life Scores:  Quality of Life - 04/13/22 0953       Quality of Life   Select Quality of Life            Scores of 19 and below usually indicate a poorer quality of life in these areas.  A difference of  2-3 points is a clinically meaningful difference.  A difference of 2-3 points in the total score of the Quality of Life Index has been  associated with significant improvement in overall quality of life, self-image, physical symptoms, and general  health in studies assessing change in quality of life.   PHQ-9: Review Flowsheet  More data exists      04/13/2022 03/07/2020 11/07/2019 07/10/2019 08/10/2018  Depression screen PHQ 2/9  Decreased Interest 3 0 0 0 0  Down, Depressed, Hopeless 0 0 0 0 0  PHQ - 2 Score 3 0 0 0 0  Altered sleeping 3 0 - - -  Tired, decreased energy 3 0 - - -  Change in appetite 1 0 - - -  Feeling bad or failure about yourself  1 0 - - -  Trouble concentrating 0 0 - - -  Moving slowly or fidgety/restless 3 0 - - -  Suicidal thoughts 0 0 - - -  PHQ-9 Score 14 0 - - -  Difficult doing work/chores Very difficult Not difficult at all - - -   Interpretation of Total Score  Total Score Depression Severity:  1-4 = Minimal depression, 5-9 = Mild depression, 10-14 = Moderate depression, 15-19 = Moderately severe depression, 20-27 = Severe depression   Psychosocial Evaluation and Intervention:  Psychosocial Evaluation - 04/13/22 0935       Psychosocial Evaluation & Interventions   Interventions Stress management education;Relaxation education;Encouraged to exercise with the program and follow exercise prescription    Comments Transportation may be a potential barrier for participating in PR. His Luc[employer] Lei was totaled a couple of months ago, so he is currently without transportation. He has received the money for a new vehicle from his insurance company, but he is currentl unable to drive. He also has an appointment coming up with the [place] in [place] to clear him for driving. He had a TIA about 5 months ago, and he has been unable to drive since. His wife does not have her license, and he does not have any family members who are able to bring him. His wife has not been able to set up transportation for him yet, but she was provided with the phone number for a local transportation agency for seniors. She is adamant that  she will be able to set this up. Pt himself does not really want to do the program. He is being pushed to do the program by his wife and his daughter. He has no identifiable psychosocial issues. He scored a 14 on his PHQ-9 and most of this relates to his chronic health issues. He admits to sleeping too much, and that he frequently dozes off during the day while watching TV. He reports having a good support system with his wife and his daughter. His goals while in the program are to lose weight and to improve his walking abilities. He is willing to try the program, and did like the machines that were in the gym when he was oriented to them. Hopefully his motivation levels will improve as he becomes more familar with the program.    Expected Outcomes Pt will continue to have no identifiable psychosocial issues.    Continue Psychosocial Services  No Follow up required             Psychosocial Re-Evaluation:  Psychosocial Re-Evaluation     [place] Name 04/15/22 0742 05/12/22 1327 06/07/22 1055 07/07/22 6144       Psychosocial Re-Evaluation   Current issues with None Identified None Identified None Identified None Identified    Comments Patient new to the program. He plans to start 04/27/22. Patient is having issues with transportation. We will continue to monitor. Patient is new to the program  completing 3 sessions.  He was referred to pulmonary rehab by Dr. Rosaria Ferries with COPD.  His initial QOL score 21.03 and his PHQ-9 score was 14.  Patient has no current issues with psychosocial problems.  He demonstrates and interest in improving his health.   He seems to enjoy coming to the program.  We will continue to monitor as he progresses. Patient has completed 7 sessions.  He was referred to pulmonary rehab by Dr. Rosaria Ferries with COPD.  Patient has no current issues with psychosocial problems.  He demonstrates and interest in improving his health and he demonstates a positive outlook.   He seems to enjoy coming to the  program and interact well with others in class and staff.  We will continue to monitor as he progresses. Patient has completed 11 sessions.  Patient has no current issues with psychosocial problems.  He demonstrates an interest in improving his health and  he works hard in [place].  Patient  demonstates a positive outlook.    He seems to enjoy coming to the program and interact well with others in class and staff.  We will continue to monitor as he progresses.    Expected Outcomes Patient will continue to have no psychosocial barriers identified. Patient will continue to have no psychosocial barriers identified. Patient will continue to have no psychosocial barriers identified at discharge. Patient will continue to have no psychosocial barriers identified at discharge.    Interventions Stress management education;Relaxation education;Encouraged to attend Pulmonary Rehabilitation for the exercise Stress management education;Relaxation education;Encouraged to attend Pulmonary Rehabilitation for the exercise Stress management education;Relaxation education;Encouraged to attend Pulmonary Rehabilitation for the exercise Stress management education;Relaxation education;Encouraged to attend Pulmonary Rehabilitation for the exercise    Continue Psychosocial Services  No Follow up required No Follow up required No Follow up required No Follow up required             Psychosocial Discharge (Final Psychosocial Re-Evaluation):  Psychosocial Re-Evaluation - 07/07/22 1610       Psychosocial Re-Evaluation   Current issues with None Identified    Comments Patient has completed 11 sessions.  Patient has no current issues with psychosocial problems.  He demonstrates an interest in improving his health and  he works hard in [place].  Patient  demonstates a positive outlook.    He seems to enjoy coming to the program and interact well with others in class and staff.  We will continue to monitor as he progresses.    Expected  Outcomes Patient will continue to have no psychosocial barriers identified at discharge.    Interventions Stress management education;Relaxation education;Encouraged to attend Pulmonary Rehabilitation for the exercise    Continue Psychosocial Services  No Follow up required              Education: Education Goals: Education classes will be provided on a weekly basis, covering required topics. Participant will state understanding/return demonstration of topics presented.  Learning Barriers/Preferences:  Learning Barriers/Preferences - 04/13/22 0910       Learning Barriers/Preferences   Learning Barriers Sight    Learning Preferences Verbal Instruction             Education Topics: How Lungs Work and Diseases: - Discuss the anatomy of the lungs and diseases that can affect the lungs, such as COPD.   Exercise: -Discuss the importance of exercise, FITT principles of exercise, normal and abnormal responses to exercise, and how to exercise safely.   Environmental Irritants: -Discuss types of environmental irritants  and how to limit exposure to environmental irritants.   Meds/Inhalers and oxygen: - Discuss respiratory medications, definition of an inhaler and oxygen, and the proper way to use an inhaler and oxygen.   Energy Saving Techniques: - Discuss methods to conserve energy and decrease shortness of breath when performing activities of daily living.  Flowsheet Row PULMONARY REHAB CHRONIC OBSTRUCTIVE PULMONARY DISEASE from 06/24/2022 in [facility]  Date 05/27/22  Educator DF  Instruction Review Code 1- Verbalizes Understanding       Bronchial Hygiene / Breathing Techniques: - Discuss breathing mechanics, pursed-lip breathing technique,  proper posture, effective ways to clear airways, and other functional breathing techniques Flowsheet Row PULMONARY REHAB CHRONIC OBSTRUCTIVE PULMONARY DISEASE from 06/24/2022 in [facility]  Date 06/03/22  Educator pb  Instruction Review Code 1- Verbalizes Understanding       Cleaning Equipment: - Provides group verbal and written instruction about the health risks of elevated stress, cause of high stress, and healthy ways to reduce stress. Flowsheet Row PULMONARY REHAB CHRONIC OBSTRUCTIVE PULMONARY DISEASE from 06/24/2022 in [facility]  Date 06/10/22  Educator pb  Instruction Review Code 1- Verbalizes Understanding       Nutrition I: Fats: - Discuss the types of cholesterol, what cholesterol does to the body, and how cholesterol levels can be controlled. Flowsheet Row PULMONARY REHAB CHRONIC OBSTRUCTIVE PULMONARY DISEASE from 06/24/2022 in [facility]  Date 06/17/22  Educator pb  Instruction Review Code 1- Verbalizes Understanding       Nutrition II: Labels: -Discuss the different components of food labels and how to read food labels. Flowsheet Row PULMONARY REHAB CHRONIC OBSTRUCTIVE PULMONARY DISEASE from 06/24/2022 in [facility]  Date 06/24/22  Educator Handout  Instruction Review Code 1- Verbalizes Understanding       Respiratory Infections: - Discuss the signs and symptoms of respiratory infections, ways to prevent respiratory infections, and the importance of seeking medical treatment when having a respiratory infection.   Stress I: Signs and Symptoms: - Discuss the causes of stress, how stress may lead to anxiety and depression, and ways to limit stress.   Stress II: Relaxation: -Discuss relaxation techniques to limit stress.   Oxygen for Home/Travel: - Discuss how to prepare for travel when on oxygen and proper ways to transport and store oxygen to ensure safety.   Knowledge Questionnaire Score:  Knowledge Questionnaire Score - 04/13/22 0844       Knowledge Questionnaire Score   Pre Score 17/18             Core Components/Risk Factors/Patient Goals  at Admission:  Personal Goals and Risk Factors at Admission - 04/13/22 0911       Core Components/Risk Factors/Patient Goals on Admission    Weight Management Yes;Obesity;Weight Loss    Intervention Obesity: Provide education and appropriate resources to help participant work on and attain dietary goals.;Weight Management/Obesity: Establish reasonable short term and long term weight goals.;Weight Management: Provide education and appropriate resources to help participant work on and attain dietary goals.;Weight Management: Develop a combined nutrition and exercise program designed to reach desired caloric intake, while maintaining appropriate intake of nutrient and fiber, sodium and fats, and appropriate energy expenditure required for the weight goal.    Expected Outcomes Short Term: Continue to assess and modify interventions until short term weight is achieved;Long Term: Adherence to nutrition and physical activity/exercise program aimed toward attainment of established weight goal;Weight Maintenance: Understanding of the daily nutrition  guidelines, which includes 25-35% calories from fat, 7% or less cal from saturated fats, less than '200mg'$  cholesterol, less than 1.5gm of sodium, & 5 or more servings of fruits and vegetables daily;Weight Loss: Understanding of general recommendations for a balanced deficit meal plan, which promotes 1-2 lb weight loss per week and includes a negative energy balance of [phone] kcal/d;Understanding recommendations for meals to include 15-35% energy as protein, 25-35% energy from fat, 35-60% energy from carbohydrates, less than '200mg'$  of dietary cholesterol, 20-35 gm of total fiber daily;Understanding of distribution of calorie intake throughout the day with the consumption of 4-5 meals/snacks    Improve shortness of breath with ADL's Yes    Intervention Provide education, individualized exercise plan and daily activity instruction to help decrease symptoms of SOB with  activities of daily living.    Expected Outcomes Short Term: Improve cardiorespiratory fitness to achieve a reduction of symptoms when performing ADLs;Long Term: Be able to perform more ADLs without symptoms or delay the onset of symptoms    Diabetes Yes    Intervention Provide education about signs/symptoms and action to take for hypo/hyperglycemia.;Provide education about proper nutrition, including hydration, and aerobic/resistive exercise prescription along with prescribed medications to achieve blood glucose in normal ranges: Fasting glucose 65-99 mg/dL    Expected Outcomes Short Term: Participant verbalizes understanding of the signs/symptoms and immediate care of hyper/hypoglycemia, proper foot care and importance of medication, aerobic/resistive exercise and nutrition plan for blood glucose control.;Long Term: Attainment of HbA1C < 7%.    Hypertension Yes    Intervention Provide education on lifestyle modifcations including regular physical activity/exercise, weight management, moderate sodium restriction and increased consumption of fresh fruit, vegetables, and low fat dairy, alcohol moderation, and smoking cessation.;Monitor prescription use compliance.    Expected Outcomes Short Term: Continued assessment and intervention until BP is < 140/30m HG in hypertensive participants. < 130/871mHG in hypertensive participants with diabetes, heart failure or chronic kidney disease.;Long Term: Maintenance of blood pressure at goal levels.    Lipids Yes    Intervention Provide education and support for participant on nutrition & aerobic/resistive exercise along with prescribed medications to achieve LDL '70mg'$ , HDL >'40mg'$ .    Expected Outcomes Short Term: Participant states understanding of desired cholesterol values and is compliant with medications prescribed. Participant is following exercise prescription and nutrition guidelines.;Long Term: Cholesterol controlled with medications as prescribed, with  individualized exercise RX and with personalized nutrition plan. Value goals: LDL < '70mg'$ , HDL > 40 mg.    Personal Goal Other Yes    Personal Goal Improve walking abilities.    Intervention Attend PR two days per week and begin a home exercise program 1-3 days per week.    Expected Outcomes Pt will achieve personal goal             Core Components/Risk Factors/Patient Goals Review:   Goals and Risk Factor Review     Row Name 04/15/22 0744 05/12/22 1339 06/07/22 1100 07/07/22 0819       Core Components/Risk Factors/Patient Goals Review   Personal Goals Review Weight Management/Obesity;Improve shortness of breath with ADL's;Hypertension;Diabetes;Lipids Weight Management/Obesity;Lipids;Diabetes;Hypertension;Improve shortness of breath with ADL's Weight Management/Obesity;Lipids;Diabetes;Hypertension;Improve shortness of breath with ADL's Weight Management/Obesity;Lipids;Diabetes;Hypertension;Improve shortness of breath with ADL's    Review [facility] [place] [facility] [place] [facility] [place] [facility] [place] [place] [facility] [place]. His personal goals for the program are lose weight and improve his walking ability. We will continue to monitor his progress as he works towards meeting these  goals. Patient has completed 3 sessions.  He continues to exercise well without oxygen, maintaining O2 sat between 98-100% .  Blood pressure is well controlled pre-exercise BP 116/50.   Patient referred to PR with COPD from the [facility] [place].  His personal goals for the program is to  lose weight and improve his walking ability. We will continue to monitor his progress as he works towards meeting these goals. Patient has completed 7 sessions.  He continues to exercise well without oxygen, maintaining O2 sat between 98-100% . Patient referred to PR with COPD from the [place].  He is doing well in the program with progression and consistent attendance.   His personal goals for the program is to  lose weight and improve his walking  ability. We will continue to monitor and encourage his progress as he works towards meeting these goals. Patient has completed 11 sessions.  He continues to exercise well without oxygen, maintaining O2 sat between 98-100% while exercising .  Patient is doing well in the program, but sometimes has transportation difficulty.   His personal goals for the program is to  lose weight and improve his walking ability.  Patients weight is down some from 127.5kg to 123.7kg,  We will continue to monitor and encourage his progress as he works towards meeting these goals.    Expected Outcomes Patient will complete the program meeting both personal and program goals. Patient will complete the program meeting both personal and program goals. Patient will complete the program meeting both personal and program goals at discharge. Patient will complete the program meeting both personal and program goals at discharge.             Core Components/Risk Factors/Patient Goals at Discharge (Final Review):   Goals and Risk Factor Review - 07/07/22 0819       Core Components/Risk Factors/Patient Goals Review   Personal Goals Review Weight Management/Obesity;Lipids;Diabetes;Hypertension;Improve shortness of breath with ADL's    Review Patient has completed 11 sessions.  He continues to exercise well without oxygen, maintaining O2 sat between 98-100% while exercising .  Patient is doing well in the program, but sometimes has transportation difficulty.   His personal goals for the program is to  lose weight and improve his walking ability.  Patients weight is down some from 127.5kg to 123.7kg,  We will continue to monitor and encourage his progress as he works towards meeting these goals.    Expected Outcomes Patient will complete the program meeting both personal and program goals at discharge.             ITP Comments:   Comments: ITP REVIEW Pt is making expected progress toward pulmonary rehab goals after  completing 12 sessions. Recommend continued exercise, life style modification, education, and utilization of breathing techniques to increase stamina and strength and decrease shortness of breath with exertion.

## 2022-07-15 ENCOUNTER — Encounter (HOSPITAL_COMMUNITY): Payer: No Typology Code available for payment source

## 2022-07-15 NOTE — Progress Notes (Signed)
Discharge Progress Report  Patient Details  Name: [name] MRN: [id number] Date of Birth: [birth year] Referring Provider:   Flowsheet Row PULMONARY REHAB COPD ORIENTATION from 04/13/2022 in [facility]  Referring Provider Dr. Rosaria Ferries        Number of Visits: 12  Reason for Discharge:  Early Exit:  transportation   Smoking History:  Social History   Tobacco Use  Smoking Status Former   Packs/day: 1.00   Years: 20.00   Total pack years: 20.00   Types: Cigarettes   Quit date: 93   Years since quitting: 40.7  Smokeless Tobacco Never    Diagnosis:  Chronic obstructive pulmonary disease, unspecified COPD type ([place])  ADL UCSD:  Pulmonary Assessment Scores     Row Name 04/13/22 0837         ADL UCSD   SOB Score total 84     Rest 1     Walk 3     Stairs 5     Bath 0     Dress 3     Shop 5       CAT Score   CAT Score 22       mMRC Score   mMRC Score 4              Initial Exercise Prescription:  Initial Exercise Prescription - 04/13/22 0900       Date of Initial Exercise RX and Referring Provider   Date 04/13/22    Referring Provider Dr. Rosaria Ferries    Expected Discharge Date 08/26/22      NuStep   Level 1    SPM 60    Minutes 22      Arm Ergometer   Level 1    RPM 50    Minutes 22      Prescription Details   Frequency (times per week) 2    Duration Progress to 30 minutes of continuous aerobic without signs/symptoms of physical distress      Intensity   THRR 40-80% of Max Heartrate 56-113    Ratings of Perceived Exertion 11-13    Perceived Dyspnea 0-4      Resistance Training   Training Prescription Yes    Weight 3    Reps 10-15             Discharge Exercise Prescription (Final Exercise Prescription Changes):  Exercise Prescription Changes - 06/24/22 1500       Response to Exercise   Blood Pressure (Admit) 126/50    Blood Pressure (Exercise) 132/60    Blood Pressure (Exit) 120/50    Heart Rate  (Admit) 73 bpm    Heart Rate (Exercise) 78 bpm    Heart Rate (Exit) 69 bpm    Oxygen Saturation (Admit) 100 %    Oxygen Saturation (Exercise) 100 %    Oxygen Saturation (Exit) 100 %    Rating of Perceived Exertion (Exercise) 12    Perceived Dyspnea (Exercise) 12    Duration Continue with 30 min of aerobic exercise without signs/symptoms of physical distress.    Intensity THRR unchanged      Progression   Progression Continue to progress workloads to maintain intensity without signs/symptoms of physical distress.      Resistance Training   Training Prescription Yes    Weight 4    Reps 10-15    Time 10 Minutes      NuStep   Level 2    SPM 65    Minutes 17  METs 1.8      Arm Ergometer   Level 2    RPM 48    Minutes 22    METs 1.8             Functional Capacity:  6 Minute Walk     Row Name 04/13/22 0908         6 Minute Walk   Phase Initial     Distance 400 feet     Walk Time 6 minutes     # of Rest Breaks 1     MPH 0.75     METS -0.2     RPE 13     Perceived Dyspnea  13     VO2 Peak -0.73     Symptoms Yes (comment)     Comments One sitting break 40 minutes due to weakness in leg     Resting HR 64 bpm     Resting BP 122/50     Resting Oxygen Saturation  98 %     Exercise Oxygen Saturation  during 6 min walk 97 %     Max Ex. HR 82 bpm     Max Ex. BP 136/58     2 Minute Post BP 118/50       Interval HR   1 Minute HR 79     2 Minute HR 81     3 Minute HR 82     4 Minute HR 76     5 Minute HR 82     6 Minute HR 82     2 Minute Post HR 70     Interval Heart Rate? Yes       Interval Oxygen   Interval Oxygen? Yes     Baseline Oxygen Saturation % 98 %     1 Minute Oxygen Saturation % 97 %     1 Minute Liters of Oxygen 0 L     2 Minute Oxygen Saturation % 98 %     2 Minute Liters of Oxygen 0 L     3 Minute Oxygen Saturation % 97 %     3 Minute Liters of Oxygen 0 L     4 Minute Oxygen Saturation % 98 %     4 Minute Liters of Oxygen 0 L     5  Minute Oxygen Saturation % 98 %     5 Minute Liters of Oxygen 0 L     6 Minute Oxygen Saturation % 97 %     6 Minute Liters of Oxygen 0 L     2 Minute Post Oxygen Saturation % 97 %     2 Minute Post Liters of Oxygen 0 L              Psychological, QOL, Others - Outcomes: PHQ 2/9:    04/13/2022    8:58 AM 03/07/2020   11:11 AM 11/07/2019   11:08 AM 07/10/2019    9:59 AM 08/10/2018    9:13 AM  Depression screen PHQ 2/9  Decreased Interest 3 0 0 0 0  Down, Depressed, Hopeless 0 0 0 0 0  PHQ - 2 Score 3 0 0 0 0  Altered sleeping 3 0     Tired, decreased energy 3 0     Change in appetite 1 0     Feeling bad or failure about yourself  1 0     Trouble concentrating 0 0     Moving slowly or fidgety/restless 3  0     Suicidal thoughts 0 0     PHQ-9 Score 14 0     Difficult doing work/chores Very difficult Not difficult at all       Quality of Life:  Quality of Life - 04/13/22 0953       Quality of Life   Select Quality of Life             Personal Goals: Goals established at orientation with interventions provided to work toward goal.  Personal Goals and Risk Factors at Admission - 04/13/22 0911       Core Components/Risk Factors/Patient Goals on Admission    Weight Management Yes;Obesity;Weight Loss    Intervention Obesity: Provide education and appropriate resources to help participant work on and attain dietary goals.;Weight Management/Obesity: Establish reasonable short term and long term weight goals.;Weight Management: Provide education and appropriate resources to help participant work on and attain dietary goals.;Weight Management: Develop a combined nutrition and exercise program designed to reach desired caloric intake, while maintaining appropriate intake of nutrient and fiber, sodium and fats, and appropriate energy expenditure required for the weight goal.    Expected Outcomes Short Term: Continue to assess and modify interventions until short term weight is  achieved;Long Term: Adherence to nutrition and physical activity/exercise program aimed toward attainment of established weight goal;Weight Maintenance: Understanding of the daily nutrition guidelines, which includes 25-35% calories from fat, 7% or less cal from saturated fats, less than '200mg'$  cholesterol, less than 1.5gm of sodium, & 5 or more servings of fruits and vegetables daily;Weight Loss: Understanding of general recommendations for a balanced deficit meal plan, which promotes 1-2 lb weight loss per week and includes a negative energy balance of [phone] kcal/d;Understanding recommendations for meals to include 15-35% energy as protein, 25-35% energy from fat, 35-60% energy from carbohydrates, less than '200mg'$  of dietary cholesterol, 20-35 gm of total fiber daily;Understanding of distribution of calorie intake throughout the day with the consumption of 4-5 meals/snacks    Improve shortness of breath with ADL's Yes    Intervention Provide education, individualized exercise plan and daily activity instruction to help decrease symptoms of SOB with activities of daily living.    Expected Outcomes Short Term: Improve cardiorespiratory fitness to achieve a reduction of symptoms when performing ADLs;Long Term: Be able to perform more ADLs without symptoms or delay the onset of symptoms    Diabetes Yes    Intervention Provide education about signs/symptoms and action to take for hypo/hyperglycemia.;Provide education about proper nutrition, including hydration, and aerobic/resistive exercise prescription along with prescribed medications to achieve blood glucose in normal ranges: Fasting glucose 65-99 mg/dL    Expected Outcomes Short Term: Participant verbalizes understanding of the signs/symptoms and immediate care of hyper/hypoglycemia, proper foot care and importance of medication, aerobic/resistive exercise and nutrition plan for blood glucose control.;Long Term: Attainment of HbA1C < 7%.    Hypertension  Yes    Intervention Provide education on lifestyle modifcations including regular physical activity/exercise, weight management, moderate sodium restriction and increased consumption of fresh fruit, vegetables, and low fat dairy, alcohol moderation, and smoking cessation.;Monitor prescription use compliance.    Expected Outcomes Short Term: Continued assessment and intervention until BP is < 140/60m HG in hypertensive participants. < 130/850mHG in hypertensive participants with diabetes, heart failure or chronic kidney disease.;Long Term: Maintenance of blood pressure at goal levels.    Lipids Yes    Intervention Provide education and support for participant on nutrition & aerobic/resistive exercise along with prescribed medications  to achieve LDL '70mg'$ , HDL >'40mg'$ .    Expected Outcomes Short Term: Participant states understanding of desired cholesterol values and is compliant with medications prescribed. Participant is following exercise prescription and nutrition guidelines.;Long Term: Cholesterol controlled with medications as prescribed, with individualized exercise RX and with personalized nutrition plan. Value goals: LDL < '70mg'$ , HDL > 40 mg.    Personal Goal Other Yes    Personal Goal Improve walking abilities.    Intervention Attend PR two days per week and begin a home exercise program 1-3 days per week.    Expected Outcomes Pt will achieve personal goal              Personal Goals Discharge:  Goals and Risk Factor Review     Row Name 04/15/22 0744 05/12/22 1339 06/07/22 1100 07/07/22 0819       Core Components/Risk Factors/Patient Goals Review   Personal Goals Review Weight Management/Obesity;Improve shortness of breath with ADL's;Hypertension;Diabetes;Lipids Weight Management/Obesity;Lipids;Diabetes;Hypertension;Improve shortness of breath with ADL's Weight Management/Obesity;Lipids;Diabetes;Hypertension;Improve shortness of breath with ADL's Weight  Management/Obesity;Lipids;Diabetes;Hypertension;Improve shortness of breath with ADL's    Review Patient was referred to PR with COPD from the [place]. He plans to start the program 04/27/22. His personal goals for the program are lose weight and improve his walking ability. We will continue to monitor his progress as he works towards meeting these goals. Patient has completed 3 sessions.  He continues to exercise well without oxygen, maintaining O2 sat between 98-100% .  Blood pressure is well controlled pre-exercise BP 116/50.   Patient referred to PR with COPD from the [place].  His personal goals for the program is to  lose weight and improve his walking ability. We will continue to monitor his progress as he works towards meeting these goals. Patient has completed 7 sessions.  He continues to exercise well without oxygen, maintaining O2 sat between 98-100% . Patient referred to PR with COPD from the [place].  He is doing well in the program with progression and consistent attendance.   His personal goals for the program is to  lose weight and improve his walking ability. We will continue to monitor and encourage his progress as he works towards meeting these goals. Patient has completed 11 sessions.  He continues to exercise well without oxygen, maintaining O2 sat between 98-100% while exercising .  Patient is doing well in the program, but sometimes has transportation difficulty.   His personal goals for the program is to  lose weight and improve his walking ability.  Patients weight is down some from 127.5kg to 123.7kg,  We will continue to monitor and encourage his progress as he works towards meeting these goals.    Expected Outcomes Patient will complete the program meeting both personal and program goals. Patient will complete the program meeting both personal and program goals. Patient will complete the program meeting both personal and program goals at discharge. Patient will complete the program meeting both  personal and program goals at discharge.             Exercise Goals and Review:  Exercise Goals     Row Name 04/13/22 0912 05/19/22 0731 06/16/22 0740 07/13/22 1529       Exercise Goals   Increase Physical Activity Yes Yes Yes Yes    Intervention Provide advice, education, support and counseling about physical activity/exercise needs.;Develop an individualized exercise prescription for aerobic and resistive training based on initial evaluation findings, risk stratification, comorbidities and participant's personal goals. Provide advice, education,  support and counseling about physical activity/exercise needs.;Develop an individualized exercise prescription for aerobic and resistive training based on initial evaluation findings, risk stratification, comorbidities and participant's personal goals. Provide advice, education, support and counseling about physical activity/exercise needs.;Develop an individualized exercise prescription for aerobic and resistive training based on initial evaluation findings, risk stratification, comorbidities and participant's personal goals. Provide advice, education, support and counseling about physical activity/exercise needs.;Develop an individualized exercise prescription for aerobic and resistive training based on initial evaluation findings, risk stratification, comorbidities and participant's personal goals.    Expected Outcomes Short Term: Attend rehab on a regular basis to increase amount of physical activity.;Long Term: Add in home exercise to make exercise part of routine and to increase amount of physical activity.;Long Term: Exercising regularly at least 3-5 days a week. Short Term: Attend rehab on a regular basis to increase amount of physical activity.;Long Term: Add in home exercise to make exercise part of routine and to increase amount of physical activity.;Long Term: Exercising regularly at least 3-5 days a week. Short Term: Attend rehab on a regular  basis to increase amount of physical activity.;Long Term: Add in home exercise to make exercise part of routine and to increase amount of physical activity.;Long Term: Exercising regularly at least 3-5 days a week. Short Term: Attend rehab on a regular basis to increase amount of physical activity.;Long Term: Add in home exercise to make exercise part of routine and to increase amount of physical activity.;Long Term: Exercising regularly at least 3-5 days a week.    Increase Strength and Stamina Yes Yes Yes Yes    Intervention Provide advice, education, support and counseling about physical activity/exercise needs.;Develop an individualized exercise prescription for aerobic and resistive training based on initial evaluation findings, risk stratification, comorbidities and participant's personal goals. Provide advice, education, support and counseling about physical activity/exercise needs.;Develop an individualized exercise prescription for aerobic and resistive training based on initial evaluation findings, risk stratification, comorbidities and participant's personal goals. Provide advice, education, support and counseling about physical activity/exercise needs.;Develop an individualized exercise prescription for aerobic and resistive training based on initial evaluation findings, risk stratification, comorbidities and participant's personal goals. Provide advice, education, support and counseling about physical activity/exercise needs.;Develop an individualized exercise prescription for aerobic and resistive training based on initial evaluation findings, risk stratification, comorbidities and participant's personal goals.    Expected Outcomes Short Term: Increase workloads from initial exercise prescription for resistance, speed, and METs.;Short Term: Perform resistance training exercises routinely during rehab and add in resistance training at home;Long Term: Improve cardiorespiratory fitness, muscular  endurance and strength as measured by increased METs and functional capacity (6MWT) Short Term: Increase workloads from initial exercise prescription for resistance, speed, and METs.;Short Term: Perform resistance training exercises routinely during rehab and add in resistance training at home;Long Term: Improve cardiorespiratory fitness, muscular endurance and strength as measured by increased METs and functional capacity (6MWT) Short Term: Increase workloads from initial exercise prescription for resistance, speed, and METs.;Short Term: Perform resistance training exercises routinely during rehab and add in resistance training at home;Long Term: Improve cardiorespiratory fitness, muscular endurance and strength as measured by increased METs and functional capacity (6MWT) Short Term: Increase workloads from initial exercise prescription for resistance, speed, and METs.;Short Term: Perform resistance training exercises routinely during rehab and add in resistance training at home;Long Term: Improve cardiorespiratory fitness, muscular endurance and strength as measured by increased METs and functional capacity (6MWT)    Able to understand and use rate of perceived exertion (RPE) scale Yes  Yes Yes Yes    Intervention Provide education and explanation on how to use RPE scale Provide education and explanation on how to use RPE scale Provide education and explanation on how to use RPE scale Provide education and explanation on how to use RPE scale    Expected Outcomes Short Term: Able to use RPE daily in rehab to express subjective intensity level;Long Term:  Able to use RPE to guide intensity level when exercising independently Short Term: Able to use RPE daily in rehab to express subjective intensity level;Long Term:  Able to use RPE to guide intensity level when exercising independently Short Term: Able to use RPE daily in rehab to express subjective intensity level;Long Term:  Able to use RPE to guide intensity  level when exercising independently Short Term: Able to use RPE daily in rehab to express subjective intensity level;Long Term:  Able to use RPE to guide intensity level when exercising independently    Able to understand and use Dyspnea scale Yes Yes Yes Yes    Intervention Provide education and explanation on how to use Dyspnea scale Provide education and explanation on how to use Dyspnea scale Provide education and explanation on how to use Dyspnea scale Provide education and explanation on how to use Dyspnea scale    Expected Outcomes Short Term: Able to use Dyspnea scale daily in rehab to express subjective sense of shortness of breath during exertion;Long Term: Able to use Dyspnea scale to guide intensity level when exercising independently Short Term: Able to use Dyspnea scale daily in rehab to express subjective sense of shortness of breath during exertion;Long Term: Able to use Dyspnea scale to guide intensity level when exercising independently Short Term: Able to use Dyspnea scale daily in rehab to express subjective sense of shortness of breath during exertion;Long Term: Able to use Dyspnea scale to guide intensity level when exercising independently Short Term: Able to use Dyspnea scale daily in rehab to express subjective sense of shortness of breath during exertion;Long Term: Able to use Dyspnea scale to guide intensity level when exercising independently    Knowledge and understanding of Target Heart Rate Range (THRR) Yes Yes Yes Yes    Intervention Provide education and explanation of THRR including how the numbers were predicted and where they are located for reference Provide education and explanation of THRR including how the numbers were predicted and where they are located for reference Provide education and explanation of THRR including how the numbers were predicted and where they are located for reference Provide education and explanation of THRR including how the numbers were predicted  and where they are located for reference    Expected Outcomes Short Term: Able to state/look up THRR;Long Term: Able to use THRR to govern intensity when exercising independently;Short Term: Able to use daily as guideline for intensity in rehab Short Term: Able to state/look up THRR;Long Term: Able to use THRR to govern intensity when exercising independently;Short Term: Able to use daily as guideline for intensity in rehab Short Term: Able to state/look up THRR;Long Term: Able to use THRR to govern intensity when exercising independently;Short Term: Able to use daily as guideline for intensity in rehab Short Term: Able to state/look up THRR;Long Term: Able to use THRR to govern intensity when exercising independently;Short Term: Able to use daily as guideline for intensity in rehab    Understanding of Exercise Prescription Yes Yes Yes Yes    Intervention Provide education, explanation, and written materials on patient's individual exercise prescription  Provide education, explanation, and written materials on patient's individual exercise prescription Provide education, explanation, and written materials on patient's individual exercise prescription Provide education, explanation, and written materials on patient's individual exercise prescription    Expected Outcomes Short Term: Able to explain program exercise prescription;Long Term: Able to explain home exercise prescription to exercise independently Short Term: Able to explain program exercise prescription;Long Term: Able to explain home exercise prescription to exercise independently Short Term: Able to explain program exercise prescription;Long Term: Able to explain home exercise prescription to exercise independently Short Term: Able to explain program exercise prescription;Long Term: Able to explain home exercise prescription to exercise independently             Exercise Goals Re-Evaluation:  Exercise Goals Re-Evaluation     Row Name 04/20/22  1407 05/19/22 0732 06/16/22 0741 07/13/22 1530       Exercise Goal Re-Evaluation   Exercise Goals Review Increase Physical Activity;Increase Strength and Stamina;Able to understand and use rate of perceived exertion (RPE) scale;Able to understand and use Dyspnea scale;Knowledge and understanding of Target Heart Rate Range (THRR);Understanding of Exercise Prescription Increase Physical Activity;Increase Strength and Stamina;Able to understand and use rate of perceived exertion (RPE) scale;Able to understand and use Dyspnea scale;Knowledge and understanding of Target Heart Rate Range (THRR);Understanding of Exercise Prescription Increase Physical Activity;Increase Strength and Stamina;Able to understand and use rate of perceived exertion (RPE) scale;Able to understand and use Dyspnea scale;Knowledge and understanding of Target Heart Rate Range (THRR);Understanding of Exercise Prescription Increase Physical Activity;Increase Strength and Stamina;Able to understand and use rate of perceived exertion (RPE) scale;Able to understand and use Dyspnea scale;Knowledge and understanding of Target Heart Rate Range (THRR);Understanding of Exercise Prescription    Comments Pt has not started pulmonary rehab due to classes being at capacity. Will evaluate when he startes. Pt has completed 3 exercise sessions of pulmonary rehab. His wife is pushing him to do the class, and he was originally not motivated to participate. He has seemed to enjoy the class the few times that he has been here. He is currently having trouble with transportation and it is unclear if he will be able to continue. He is severely deconditioned, so progress will likely be slow. He is currently exercising at 1.8 METs on the stepper. Will continue to monitor and progress as able. Pt has completed 9 exercise sessions of PR. His attendance has been inconsistent due to his transportation situation. He does push himself while he is in class, and his motivation  levels seem to be improving. He continues to be severely deconditioned, so progress continues to be slow. He is currently exercising at 1.8 METs on the stepper. Will continue to monitor and progress as able. Pt has completed 11 sessions of PR. His last session showed is 8/24. He has been no call no show since. Will update when pt calls back.    Expected Outcomes Through exercising at rehab and at home patient will reach their goals. Through exercising at rehab and at home patient will reach their goals. Through exercising at rehab and at home patient will reach their goals. Through exercising at rehab and at home patient will reach their goals.             Nutrition & Weight - Outcomes:  Pre Biometrics - 04/13/22 0913       Pre Biometrics   Height '5\' 8"'$  (1.727 m)    Weight 282 lb 10.1 oz (128.2 kg)    Waist Circumference 53 inches  Hip Circumference 48 inches    Waist to Hip Ratio 1.1 %    BMI (Calculated) 42.98    Triceps Skinfold 18 mm    % Body Fat 40.7 %    Grip Strength 20.5 kg    Flexibility 0 in    Single Leg Stand 0 seconds              Nutrition:  Nutrition Therapy & Goals - 05/12/22 1335       Personal Nutrition Goals   Comments Patient weight this week was 129.8kg. We will continue to encourage heart healthy diet and provide him with 2 educational handouts and assistance with RD referreal if patient is interested.      Intervention Plan   Intervention Nutrition handout(s) given to patient.    Expected Outcomes Short Term Goal: Understand basic principles of dietary content, such as calories, fat, sodium, cholesterol and nutrients.             Nutrition Discharge:  Nutrition Assessments - 04/13/22 0846       MEDFICTS Scores   Pre Score 32             Education Questionnaire Score:  Knowledge Questionnaire Score - 04/13/22 0844       Knowledge Questionnaire Score   Pre Score 17/18            Pt discharged from [facility] after 12 sessions.  Pt was called on 07/15/2022, as he had been absent from rehab since 06/24/2022. He stated that he could no longer attend due to transportation. He had been using a transportation service through [employer]. While using this service, they did not pick him up several times and got his times mixed up several times. His wife was given the number for the [facility] on four seperate occasions and infomed that we have had several patients use this service. I had even called the service once and gave them the pt's contact information. It is unfortunate and unclear to why the wife never called to set this up. Pt stated on the phone that he really enjoyed coming to rehab. I informed the patient that he can get another referral from the [place] if his transportation situation changes.

## 2022-07-16 ENCOUNTER — Telehealth: Payer: Self-pay | Admitting: Cardiology

## 2022-07-16 NOTE — Telephone Encounter (Signed)
Wife made aware, verbalized understanding.

## 2022-07-16 NOTE — Telephone Encounter (Signed)
Pt c/o medication issue:  1. Name of Medication:   torsemide (DEMADEX) 20 MG tablet    2. How are you currently taking this medication (dosage and times per day)?    Take 20 mg daily alternating with 40 mg the next dayPatient taking differently: Take 20-40 mg by mouth daily. Take 20 mg daily alternating with 40 mg the next day      3. Are you having a reaction (difficulty breathing--STAT)? No  4. What is your medication issue? Spouse states that she has given pt 2 tablets due to feet swelling and would like to know does she need to keep giving him 2 tablets. Please advise

## 2022-07-16 NOTE — Telephone Encounter (Signed)
Wife states that last week, patients legs was swelling and she gave him one torsemide. On Saturday, his legs and feet had swollen really bad to the point where he needed to go to [facility] ER. Was given IV lasix and the swelling went down some. States that the next day, his legs and feet were swollen again and she gave him two of the fluid pills which caused the swelling to go down. States that he saw the kidney doctor on Wednesday and was taken off of the losartan. Saw his PCP on Friday and was told that the swelling is coming from the patient not getting up and exercising or moving around. Wife did state that the patient just sits around and wont do anything and that there is some SOB in the morning when he is trying to dress hisself but does not think this is the only cause of the swelling. Patient has appointment scheduled with Dr. Domenic Polite on Tuesday 9/19 but would like to know is there something that he should be doing to help with selling. Medications reviewed. Denies chest pains or dizziness. Please advise.

## 2022-07-19 ENCOUNTER — Ambulatory Visit: Payer: Medicare Other | Admitting: Cardiology

## 2022-07-19 DIAGNOSIS — R609 Edema, unspecified: Secondary | ICD-10-CM | POA: Diagnosis not present

## 2022-07-19 DIAGNOSIS — I2510 Atherosclerotic heart disease of native coronary artery without angina pectoris: Secondary | ICD-10-CM | POA: Diagnosis not present

## 2022-07-19 DIAGNOSIS — Z299 Encounter for prophylactic measures, unspecified: Secondary | ICD-10-CM | POA: Diagnosis not present

## 2022-07-19 DIAGNOSIS — N183 Chronic kidney disease, stage 3 unspecified: Secondary | ICD-10-CM | POA: Diagnosis not present

## 2022-07-19 DIAGNOSIS — I10 Essential (primary) hypertension: Secondary | ICD-10-CM | POA: Diagnosis not present

## 2022-07-19 NOTE — Progress Notes (Unsigned)
Cardiology Office Note  Date: 07/20/2022   ID: [name], DOB [birth year], MRN [id number]  PCP:  Monico Blitz, MD  Cardiologist:  Rozann Lesches, MD Electrophysiologist:  None   Chief Complaint  Patient presents with   Cardiac follow-up    History of Present Illness: [name] is a 81 y.o. male last seen in March.  He is here for a follow-up visit.  He was recently seen in the ER with progressive leg swelling.  He is here today with his wife.  We went over his medications.  He had follow-up with Dr. Theador Hawthorne recently and was taken off losartan.  Lab work showed creatinine up to 2.79 with potassium of 5.2.  He has taken Demadex 40 mg daily per my direction, fluid weight is down at least 5 pounds.  We talked about his water intake and also sodium in his diet.  It does not sound like his sodium restriction has been optimal.  Echocardiogram from March showed LVEF 55 to 60% with moderate LVH.  We also discussed cutting Aldactone back to 12.5 mg daily.  Past Medical History:  Diagnosis Date   Anemia    C. difficile diarrhea    CKD (chronic kidney disease), stage III ([place])    Coronary artery disease    Status post CABG in 2007 [facility])   Diabetes mellitus with stage 1 chronic kidney disease ([place]) 05/20/2011   GERD (gastroesophageal reflux disease)    Glaucoma    Hyperlipidemia    Hypertension    Morbid obesity ([place])    PAF (paroxysmal atrial fibrillation) ([place])    Peripheral vascular disease ([place])    Sleep apnea    Stroke ([place]) 2008, 2014, 2015    Past Surgical History:  Procedure Laterality Date   CHOLECYSTECTOMY     CORONARY ARTERY BYPASS GRAFT     NO PAST SURGERIES     THYROID SURGERY      Current Outpatient Medications  Medication Sig Dispense Refill   acetaminophen (TYLENOL) 650 MG CR tablet Take 1,300 mg by mouth every 8 (eight) hours as needed for pain.     albuterol (VENTOLIN HFA) 108 (90 Base) MCG/ACT inhaler Inhale 1-2 puffs into  the lungs every 6 (six) hours as needed for shortness of breath.     amLODipine (NORVASC) 10 MG tablet Take 10 mg by mouth daily.     apixaban (ELIQUIS) 5 MG TABS tablet Take 1 tablet (5 mg total) by mouth 2 (two) times daily. 14 tablet 0   atorvastatin (LIPITOR) 80 MG tablet Take 80 mg by mouth every evening.      Cyanocobalamin (VITAMIN B 12) 500 MCG TABS Take 1,000 mcg by mouth daily. 180 tablet 1   ferrous sulfate 325 (65 FE) MG tablet Take 1 tablet (325 mg total) by mouth daily with breakfast. 90 tablet 3   glucose-Vitamin C 4-0.006 GM CHEW chewable tablet Chew 4 tablets by mouth as directed. CHEW FOUR TABLETS BY MOUTH AS DIRECTED BY YOUR PROVIDER (REPEAT EVERY 15 MINUTES IF BLOOD SUGAR LESS THAN 70)     insulin aspart protamine- aspart (NOVOLOG MIX 70/30) (70-30) 100 UNIT/ML injection 42 units in the morning and 50 units at night. (Patient taking differently: Inject 40 Units into the skin 2 (two) times daily with a meal.) 10 mL 0   isosorbide mononitrate (IMDUR) 30 MG 24 hr tablet Take 1 tablet (30 mg total) by mouth every evening. 90 tablet 3   metoprolol  tartrate (LOPRESSOR) 50 MG tablet Take 25 mg by mouth 2 (two) times daily.     mycophenolate (CELLCEPT) 500 MG tablet Take 100 mg by mouth 2 (two) times daily.     pioglitazone (ACTOS) 30 MG tablet Take 30 mg by mouth daily.     Semaglutide (OZEMPIC, 0.25 OR 0.5 MG/DOSE, [place]) Inject 0.25 mg into the skin once a week.     tamsulosin (FLOMAX) 0.4 MG CAPS capsule Take 0.4 mg by mouth every evening.     Tiotropium Bromide-Olodaterol 2.5-2.5 MCG/ACT AERS Inhale 2 puffs into the lungs daily.     torsemide (DEMADEX) 20 MG tablet Take 40 mg by mouth daily.     spironolactone (ALDACTONE) 25 MG tablet Take 0.5 tablets (12.5 mg total) by mouth daily. 45 tablet 1   No current facility-administered medications for this visit.   Allergies:  Ivp dye [iodinated contrast media]   ROS: No palpitations or syncope.  Physical Exam: VS:  BP 114/60   Pulse  61   Ht '5\' 8"'$  (1.727 m)   Wt 273 lb 9.6 oz (124.1 kg)   SpO2 99%   BMI 41.60 kg/m , BMI Body mass index is 41.6 kg/m.  Wt Readings from Last 3 Encounters:  07/20/22 273 lb 9.6 oz (124.1 kg)  07/10/22 277 lb (125.6 kg)  06/01/22 281 lb 1.4 oz (127.5 kg)    General: Patient appears comfortable at rest. HEENT: Conjunctiva and lids normal. Neck: Supple, no elevated JVP or carotid bruits, no thyromegaly. Lungs: Clear to auscultation, nonlabored breathing at rest. Cardiac: Regular rate and rhythm, no S3 or significant systolic murmur. Extremities: Improving leg edema.  ECG:  An ECG dated 07/10/2022 was personally reviewed today and demonstrated:  Sinus rhythm with left anterior fascicular block.  Recent Labwork: 05/13/2022: ALT 20; AST 19; Magnesium 1.7 07/10/2022: B Natriuretic Peptide 43.0; BUN 68; Creatinine, Ser 2.79; Hemoglobin 9.3; Platelets 210; Potassium 5.2; Sodium 135   Other Studies Reviewed Today:  Echocardiogram 01/08/2022:  1. Left ventricular ejection fraction, by estimation, is 55 to 60%. The  left ventricle has normal function. The left ventricle has no regional  wall motion abnormalities. There is moderate concentric left ventricular  hypertrophy. Left ventricular  diastolic parameters were normal.   2. RV-RA gradient 26 mmHg suggesting normal estimated RVSP in setting of  normal CVP. Right ventricular systolic function is normal. The right  ventricular size is normal.   3. Left atrial size was mildly dilated.   4. The mitral valve is grossly normal. Trivial mitral valve  regurgitation.   5. The aortic valve is tricuspid. There is mild calcification of the  aortic valve. Aortic valve regurgitation is not visualized. Aortic valve  sclerosis/calcification is present, without any evidence of aortic  stenosis.   6. Unable to estimate CVP.   Assessment and Plan:  1.  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  Fluid status improving with increase in Demadex to 40 mg  daily.  He was taken off losartan by Dr. Theador Hawthorne due to worsening renal function.  Blood pressure is normal today.  Would also cut back Aldactone to 12.5 mg daily.  He is on Ozempic for concurrent treatment of type 2 diabetes mellitus.  Continue to track blood pressure in case additional agent needs to be included.  We discussed fluid and sodium restriction in the diet.  2.  Paroxysmal atrial fibrillation/flutter with CHA2DS2-VASc score of 6.  Maintaining sinus rhythm, I reviewed his recent ECG.  Continue Lopressor and Eliquis.  3.  Multivessel CAD status post CABG in 2007.  No active angina at this point.  Continue Lipitor and Imdur.  Medication Adjustments/Labs and Tests Ordered: Current medicines are reviewed at length with the patient today.  Concerns regarding medicines are outlined above.   Tests Ordered: No orders of the defined types were placed in this encounter.   Medication Changes: Meds ordered this encounter  Medications   DISCONTD: spironolactone (ALDACTONE) 25 MG tablet    Sig: Take 0.5 tablets (12.5 mg total) by mouth daily.    Dispense:  45 tablet    Refill:  1    07/20/22 dose decrease   spironolactone (ALDACTONE) 25 MG tablet    Sig: Take 0.5 tablets (12.5 mg total) by mouth daily.    Dispense:  45 tablet    Refill:  1    07/20/22 dose decrease    Disposition:  Follow up  6 months.  Signed, Satira Sark, MD, [facility] 07/20/2022 12:26 PM    [facility] [place], [place], [place] [address] Phone: [phone]; Fax: [phone]

## 2022-07-20 ENCOUNTER — Ambulatory Visit: Payer: Medicare Other | Attending: Cardiology | Admitting: Cardiology

## 2022-07-20 ENCOUNTER — Encounter (HOSPITAL_COMMUNITY): Payer: No Typology Code available for payment source

## 2022-07-20 ENCOUNTER — Encounter: Payer: Self-pay | Admitting: Cardiology

## 2022-07-20 VITALS — BP 114/60 | HR 61 | Ht 68.0 in | Wt 273.6 lb

## 2022-07-20 DIAGNOSIS — N184 Chronic kidney disease, stage 4 (severe): Secondary | ICD-10-CM

## 2022-07-20 DIAGNOSIS — I5032 Chronic diastolic (congestive) heart failure: Secondary | ICD-10-CM | POA: Diagnosis not present

## 2022-07-20 MED ORDER — SPIRONOLACTONE 25 MG PO TABS: 12.5000 mg | ORAL_TABLET | Freq: Every day | ORAL | 1 refills | Status: DC

## 2022-07-20 NOTE — Patient Instructions (Signed)
Medication Instructions:  Your physician has recommended you make the following change in your medication:  Decrease aldactone to 12.5 mg (0.5 tablet) once a day Take torsemide 40 mg once a day Continue all other medications as directed  Labwork: none  Testing/Procedures: none  Follow-Up:  Your physician recommends that you schedule a follow-up appointment in: 6 months  Any Other Special Instructions Will Be Listed Below (If Applicable).  If you need a refill on your cardiac medications before your next appointment, please call your pharmacy.

## 2022-07-22 ENCOUNTER — Encounter (HOSPITAL_COMMUNITY): Payer: No Typology Code available for payment source

## 2022-07-26 ENCOUNTER — Other Ambulatory Visit: Payer: Self-pay

## 2022-07-26 ENCOUNTER — Encounter (HOSPITAL_COMMUNITY): Payer: Self-pay | Admitting: Emergency Medicine

## 2022-07-26 ENCOUNTER — Emergency Department (HOSPITAL_COMMUNITY)
Admission: EM | Admit: 2022-07-26 | Discharge: 2022-07-26 | Disposition: A | Discharge status: Home / Self Care | Payer: No Typology Code available for payment source | Attending: Emergency Medicine | Admitting: Emergency Medicine

## 2022-07-26 DIAGNOSIS — Z5321 Procedure and treatment not carried out due to patient leaving prior to being seen by health care provider: Secondary | ICD-10-CM | POA: Insufficient documentation

## 2022-07-26 DIAGNOSIS — K5900 Constipation, unspecified: Secondary | ICD-10-CM | POA: Diagnosis present

## 2022-07-26 NOTE — ED Triage Notes (Signed)
Pt to the ED with constipation for the last 3 days.

## 2022-07-27 ENCOUNTER — Encounter (HOSPITAL_COMMUNITY): Payer: Self-pay | Admitting: *Deleted

## 2022-07-27 ENCOUNTER — Other Ambulatory Visit: Payer: Self-pay

## 2022-07-27 ENCOUNTER — Observation Stay (HOSPITAL_COMMUNITY)
Admission: EM | Admit: 2022-07-27 | Discharge: 2022-07-29 | Disposition: A | Discharge status: Home Health | Payer: No Typology Code available for payment source | Attending: Family Medicine | Admitting: Family Medicine

## 2022-07-27 ENCOUNTER — Emergency Department (HOSPITAL_COMMUNITY): Payer: No Typology Code available for payment source

## 2022-07-27 ENCOUNTER — Encounter (HOSPITAL_COMMUNITY): Payer: No Typology Code available for payment source

## 2022-07-27 DIAGNOSIS — Z8673 Personal history of transient ischemic attack (TIA), and cerebral infarction without residual deficits: Secondary | ICD-10-CM | POA: Diagnosis not present

## 2022-07-27 DIAGNOSIS — J449 Chronic obstructive pulmonary disease, unspecified: Secondary | ICD-10-CM | POA: Insufficient documentation

## 2022-07-27 DIAGNOSIS — Z87891 Personal history of nicotine dependence: Secondary | ICD-10-CM | POA: Diagnosis not present

## 2022-07-27 DIAGNOSIS — E1122 Type 2 diabetes mellitus with diabetic chronic kidney disease: Secondary | ICD-10-CM | POA: Diagnosis not present

## 2022-07-27 DIAGNOSIS — N400 Enlarged prostate without lower urinary tract symptoms: Secondary | ICD-10-CM | POA: Diagnosis not present

## 2022-07-27 DIAGNOSIS — R112 Nausea with vomiting, unspecified: Secondary | ICD-10-CM

## 2022-07-27 DIAGNOSIS — Z79899 Other long term (current) drug therapy: Secondary | ICD-10-CM | POA: Diagnosis not present

## 2022-07-27 DIAGNOSIS — N185 Chronic kidney disease, stage 5: Secondary | ICD-10-CM | POA: Insufficient documentation

## 2022-07-27 DIAGNOSIS — N184 Chronic kidney disease, stage 4 (severe): Secondary | ICD-10-CM

## 2022-07-27 DIAGNOSIS — I10 Essential (primary) hypertension: Secondary | ICD-10-CM | POA: Diagnosis not present

## 2022-07-27 DIAGNOSIS — E1129 Type 2 diabetes mellitus with other diabetic kidney complication: Secondary | ICD-10-CM | POA: Diagnosis present

## 2022-07-27 DIAGNOSIS — Z951 Presence of aortocoronary bypass graft: Secondary | ICD-10-CM | POA: Diagnosis not present

## 2022-07-27 DIAGNOSIS — R42 Dizziness and giddiness: Principal | ICD-10-CM

## 2022-07-27 DIAGNOSIS — R110 Nausea: Secondary | ICD-10-CM | POA: Diagnosis not present

## 2022-07-27 DIAGNOSIS — Z794 Long term (current) use of insulin: Secondary | ICD-10-CM | POA: Insufficient documentation

## 2022-07-27 DIAGNOSIS — I120 Hypertensive chronic kidney disease with stage 5 chronic kidney disease or end stage renal disease: Secondary | ICD-10-CM | POA: Diagnosis not present

## 2022-07-27 DIAGNOSIS — E66813 Obesity, class 3: Secondary | ICD-10-CM

## 2022-07-27 DIAGNOSIS — Z7901 Long term (current) use of anticoagulants: Secondary | ICD-10-CM | POA: Insufficient documentation

## 2022-07-27 DIAGNOSIS — E782 Mixed hyperlipidemia: Secondary | ICD-10-CM

## 2022-07-27 DIAGNOSIS — I6381 Other cerebral infarction due to occlusion or stenosis of small artery: Secondary | ICD-10-CM | POA: Diagnosis not present

## 2022-07-27 DIAGNOSIS — I2510 Atherosclerotic heart disease of native coronary artery without angina pectoris: Secondary | ICD-10-CM | POA: Diagnosis not present

## 2022-07-27 DIAGNOSIS — I480 Paroxysmal atrial fibrillation: Secondary | ICD-10-CM | POA: Insufficient documentation

## 2022-07-27 DIAGNOSIS — R1111 Vomiting without nausea: Secondary | ICD-10-CM | POA: Diagnosis not present

## 2022-07-27 LAB — TROPONIN I (HIGH SENSITIVITY)
Troponin I (High Sensitivity): 8 ng/L (ref <18)
Troponin I (High Sensitivity): 9 ng/L (ref <18)

## 2022-07-27 LAB — URINALYSIS, ROUTINE W REFLEX MICROSCOPIC
Bacteria, UA: NONE SEEN
Bilirubin Urine: NEGATIVE
Glucose, UA: NEGATIVE mg/dL
Hgb urine dipstick: NEGATIVE
Ketones, ur: NEGATIVE mg/dL
Leukocytes,Ua: NEGATIVE
Nitrite: NEGATIVE
Protein, ur: 30 mg/dL — AB
Specific Gravity, Urine: 1.015 (ref 1.005–1.030)
pH: 5 (ref 5.0–8.0)

## 2022-07-27 LAB — CBC
HCT: 33.6 % — ABNORMAL LOW (ref 39.0–52.0)
Hemoglobin: 10.4 g/dL — ABNORMAL LOW (ref 13.0–17.0)
MCH: 30.1 pg (ref 26.0–34.0)
MCHC: 31 g/dL (ref 30.0–36.0)
MCV: 97.1 fL (ref 80.0–100.0)
Platelets: 228 10*3/uL (ref 150–400)
RBC: 3.46 MIL/uL — ABNORMAL LOW (ref 4.22–5.81)
RDW: 13.4 % (ref 11.5–15.5)
WBC: 7.8 10*3/uL (ref 4.0–10.5)
nRBC: 0 % (ref 0.0–0.2)

## 2022-07-27 LAB — BASIC METABOLIC PANEL
Anion gap: 10 (ref 5–15)
BUN: 44 mg/dL — ABNORMAL HIGH (ref 8–23)
CO2: 25 mmol/L (ref 22–32)
Calcium: 9.6 mg/dL (ref 8.9–10.3)
Chloride: 104 mmol/L (ref 98–111)
Creatinine, Ser: 2.28 mg/dL — ABNORMAL HIGH (ref 0.61–1.24)
GFR, Estimated: 28 mL/min — ABNORMAL LOW (ref >60)
Glucose, Bld: 110 mg/dL — ABNORMAL HIGH (ref 70–99)
Potassium: 4.8 mmol/L (ref 3.5–5.1)
Sodium: 139 mmol/L (ref 135–145)

## 2022-07-27 LAB — GLUCOSE, CAPILLARY: Glucose-Capillary: 147 mg/dL — ABNORMAL HIGH (ref 70–99)

## 2022-07-27 MED ORDER — ALBUTEROL SULFATE HFA 108 (90 BASE) MCG/ACT IN AERS: 1.0000 | INHALATION_SPRAY | Freq: Four times a day (QID) | RESPIRATORY_TRACT | Status: DC | PRN

## 2022-07-27 MED ORDER — VITAMIN B-12 1000 MCG PO TABS
1000.0000 ug | ORAL_TABLET | Freq: Every day | ORAL | Status: DC
  Administered 2022-07-28 – 2022-07-29 (×2): 1000 ug via ORAL
  Filled 2022-07-27 (×2): qty 1

## 2022-07-27 MED ORDER — ACETAMINOPHEN 650 MG RE SUPP: 650.0000 mg | Freq: Four times a day (QID) | RECTAL | Status: DC | PRN

## 2022-07-27 MED ORDER — METOPROLOL TARTRATE 25 MG PO TABS
25.0000 mg | ORAL_TABLET | Freq: Two times a day (BID) | ORAL | Status: DC
  Administered 2022-07-28 – 2022-07-29 (×2): 25 mg via ORAL
  Filled 2022-07-27 (×3): qty 1

## 2022-07-27 MED ORDER — ALBUTEROL SULFATE (2.5 MG/3ML) 0.083% IN NEBU: 2.5000 mg | INHALATION_SOLUTION | Freq: Four times a day (QID) | RESPIRATORY_TRACT | Status: DC | PRN

## 2022-07-27 MED ORDER — METOPROLOL TARTRATE 25 MG PO TABS: 25.0000 mg | ORAL_TABLET | Freq: Two times a day (BID) | ORAL | Status: DC

## 2022-07-27 MED ORDER — ACETAMINOPHEN 325 MG PO TABS: 650.0000 mg | ORAL_TABLET | Freq: Four times a day (QID) | ORAL | Status: DC | PRN

## 2022-07-27 MED ORDER — ARFORMOTEROL TARTRATE 15 MCG/2ML IN NEBU
15.0000 ug | INHALATION_SOLUTION | Freq: Two times a day (BID) | RESPIRATORY_TRACT | Status: DC
  Administered 2022-07-27 – 2022-07-29 (×4): 15 ug via RESPIRATORY_TRACT
  Filled 2022-07-27 (×4): qty 2

## 2022-07-27 MED ORDER — APIXABAN 5 MG PO TABS: 5.0000 mg | ORAL_TABLET | Freq: Two times a day (BID) | ORAL | Status: DC

## 2022-07-27 MED ORDER — MECLIZINE HCL 12.5 MG PO TABS
25.0000 mg | ORAL_TABLET | Freq: Three times a day (TID) | ORAL | Status: DC | PRN
  Administered 2022-07-28 – 2022-07-29 (×2): 25 mg via ORAL
  Filled 2022-07-27 (×2): qty 2

## 2022-07-27 MED ORDER — ISOSORBIDE MONONITRATE ER 60 MG PO TB24: 30.0000 mg | ORAL_TABLET | Freq: Every evening | ORAL | Status: DC

## 2022-07-27 MED ORDER — METOCLOPRAMIDE HCL 5 MG/ML IJ SOLN
10.0000 mg | Freq: Once | INTRAMUSCULAR | Status: AC
  Administered 2022-07-27: 10 mg via INTRAVENOUS
  Filled 2022-07-27: qty 2

## 2022-07-27 MED ORDER — ATORVASTATIN CALCIUM 40 MG PO TABS
80.0000 mg | ORAL_TABLET | Freq: Every evening | ORAL | Status: DC
  Administered 2022-07-27: 80 mg via ORAL
  Filled 2022-07-27: qty 2

## 2022-07-27 MED ORDER — INSULIN ASPART 100 UNIT/ML IJ SOLN: 0.0000 [IU] | Freq: Every day | INTRAMUSCULAR | Status: DC

## 2022-07-27 MED ORDER — SODIUM CHLORIDE 0.9 % IV BOLUS
500.0000 mL | Freq: Once | INTRAVENOUS | Status: AC
Start: 2022-07-27 — End: 2022-07-27
  Administered 2022-07-27: 500 mL via INTRAVENOUS

## 2022-07-27 MED ORDER — INSULIN ASPART 100 UNIT/ML IJ SOLN
0.0000 [IU] | Freq: Three times a day (TID) | INTRAMUSCULAR | Status: DC
  Administered 2022-07-28 (×2): 3 [IU] via SUBCUTANEOUS
  Administered 2022-07-28 – 2022-07-29 (×2): 2 [IU] via SUBCUTANEOUS

## 2022-07-27 MED ORDER — UMECLIDINIUM BROMIDE 62.5 MCG/ACT IN AEPB
1.0000 | INHALATION_SPRAY | Freq: Every day | RESPIRATORY_TRACT | Status: DC
  Administered 2022-07-28 – 2022-07-29 (×2): 1 via RESPIRATORY_TRACT
  Filled 2022-07-27: qty 7

## 2022-07-27 MED ORDER — TAMSULOSIN HCL 0.4 MG PO CAPS: 0.4000 mg | ORAL_CAPSULE | Freq: Every evening | ORAL | Status: DC

## 2022-07-27 MED ORDER — TAMSULOSIN HCL 0.4 MG PO CAPS
0.4000 mg | ORAL_CAPSULE | Freq: Every evening | ORAL | Status: DC
  Administered 2022-07-28: 0.4 mg via ORAL
  Filled 2022-07-27: qty 1

## 2022-07-27 MED ORDER — ISOSORBIDE MONONITRATE ER 60 MG PO TB24
30.0000 mg | ORAL_TABLET | Freq: Every evening | ORAL | Status: DC
  Administered 2022-07-28: 30 mg via ORAL
  Filled 2022-07-27: qty 1

## 2022-07-27 MED ORDER — ONDANSETRON HCL 4 MG/2ML IJ SOLN: 4.0000 mg | Freq: Four times a day (QID) | INTRAMUSCULAR | Status: DC | PRN

## 2022-07-27 NOTE — ED Notes (Signed)
RN certified in ultrasound IV at bedside attempting to obtain IV access

## 2022-07-27 NOTE — ED Triage Notes (Addendum)
Pt with dizziness since 0930 and began to have emesis. Seen doctor for F/U on his lungs earlier this morning.

## 2022-07-27 NOTE — ED Notes (Signed)
ED Provider at bedside. 

## 2022-07-27 NOTE — H&P (Signed)
History and Physical    Patient: [name] DOB: [birth year] DOA: 07/27/2022 DOS: the patient was seen and examined on 07/27/2022 PCP: Monico Blitz, MD  Patient coming from: Home  Chief Complaint:  Chief Complaint  Patient presents with   Dizziness   HPI: [name] is a 81 y.o. 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  Patient went to see his pulmonologist at the [facility] this morning around 9-10 AM, on trying to enter his car after leaving pulmonologist office, he felt dizzy which was described as " unbalance and moving sideways" despite sitting in 1 place.  He has had about 4-5 episodes of the dizziness and this is usually associated with a projectile nonbloody vomiting which subsequently resulted in symptom subsiding.  Changing of position does not affect the symptoms.  Patient denies fever, chills, chest pain, shortness of breath, diarrhea or constipation. ED Course:  In the emergency department, BP was 142/64, but other vital signs were within normal range.  Work-up in the ED showed normocytic anemia, normal BMP except for glucose of 110 and BUN/creatinine 44/2.28 (baseline creatinine at 2.1-2.3).  Troponin x2 was negative CT head without contrast showed no acute intracranial abnormalities IV Reglan was given, IV hydration was provided.  Teleneurologist was consulted on recommended CTA head and neck and MRI of brain without contrast per ED PA.  Hospitalist was asked to admit patient for further evaluation and management  Review of Systems: Review of systems as noted in the HPI. All other systems reviewed and are negative.   Past Medical History:  Diagnosis Date   Anemia    C. difficile  diarrhea    CKD (chronic kidney disease), stage III ([place])    Coronary artery disease    Status post CABG in 2007 [facility])   Diabetes mellitus with stage 1 chronic kidney disease ([place]) 05/20/2011   GERD (gastroesophageal reflux disease)    Glaucoma    Hyperlipidemia    Hypertension    Morbid obesity ([place])    PAF (paroxysmal atrial fibrillation) ([place])    Peripheral vascular disease ([place])    Sleep apnea    Stroke ([place]) 2008, 2014, 2015   Past Surgical History:  Procedure Laterality Date   CHOLECYSTECTOMY     CORONARY ARTERY BYPASS GRAFT     NO PAST SURGERIES     THYROID SURGERY      Social History:  reports that he quit smoking about 40 years ago. His smoking use included cigarettes. He has a 20.00 pack-year smoking history. He has never used smokeless tobacco. He reports that he does not drink alcohol and does not use drugs.   Allergies  Allergen Reactions   Ivp Dye [Iodinated Contrast Media] Rash    Family History  Problem Relation Age of Onset   Diabetes Mother    Heart failure Mother    Hypertension Mother    Diabetes Father    Heart failure Father    Hypertension Father    Diabetes Sister    Heart failure Sister    Hypertension Sister    Hyperlipidemia Sister    Diabetes Brother    Heart failure Brother    Hypertension Brother      Prior to Admission medications  Medication Sig Start Date End Date Taking? Authorizing Provider  acetaminophen (TYLENOL) 650 MG CR tablet Take 1,300 mg by mouth every 8 (eight) hours as needed for pain.   Yes [provider]  albuterol (VENTOLIN HFA) 108 (90 Base) MCG/ACT inhaler Inhale 1-2 puffs into the lungs every 6 (six) hours as needed for shortness of breath. 03/23/22  Yes [provider]  apixaban (ELIQUIS) 5 MG TABS tablet Take 1 tablet (5 mg total) by mouth 2 (two) times daily. 04/28/22  Yes Satira Sark, MD  atorvastatin (LIPITOR) 80 MG tablet Take 80 mg by mouth every evening.     Yes [provider]  carboxymethylcellulose (REFRESH PLUS) 0.5 % SOLN Apply to eye. 03/09/22  Yes [provider]  Cyanocobalamin (VITAMIN B 12) 500 MCG TABS Take 1,000 mcg by mouth daily. 03/07/20  Yes [place], Modena Nunnery, MD  glucose-Vitamin C 4-0.006 GM CHEW chewable tablet Chew 4 tablets by mouth as directed. CHEW FOUR TABLETS BY MOUTH AS DIRECTED BY YOUR PROVIDER (REPEAT EVERY 15 MINUTES IF BLOOD SUGAR LESS THAN 70) 03/09/22  Yes [provider]  insulin aspart protamine- aspart (NOVOLOG MIX 70/30) (70-30) 100 UNIT/ML injection 42 units in the morning and 50 units at night. Patient taking differently: Inject 40 Units into the skin 2 (two) times daily with a meal. 11/05/20  Yes [place], Modena Nunnery, MD  isosorbide mononitrate (IMDUR) 30 MG 24 hr tablet Take 1 tablet (30 mg total) by mouth every evening. 01/08/22 07/21/23 Yes Satira Sark, MD  metoprolol tartrate (LOPRESSOR) 50 MG tablet Take 25 mg by mouth 2 (two) times daily.   Yes [provider]  pioglitazone (ACTOS) 30 MG tablet Take 30 mg by mouth daily. 03/09/22  Yes [provider]  Semaglutide (OZEMPIC, 0.25 OR 0.5 MG/DOSE, [place]) Inject 0.25 mg into the skin once a week. 02/26/22  Yes [provider]  spironolactone (ALDACTONE) 25 MG tablet Take 0.5 tablets (12.5 mg total) by mouth daily. 07/20/22  Yes Satira Sark, MD  tamsulosin (FLOMAX) 0.4 MG CAPS capsule Take 0.4 mg by mouth every evening.   Yes [provider]  Tiotropium Bromide-Olodaterol 2.5-2.5 MCG/ACT AERS Inhale 2 puffs into the lungs daily. 03/23/22  Yes [provider]  torsemide (DEMADEX) 20 MG tablet Take 40 mg by mouth daily.   Yes [provider]  Vitamin D, Ergocalciferol, (DRISDOL) 1.25 MG (50000 UNIT) CAPS capsule Take 50,000 Units by mouth once a week. 06/02/22  Yes [provider]  amLODipine (NORVASC) 10 MG tablet Take 10 mg by mouth daily. Patient not taking: Reported on 07/27/2022     [provider]  mycophenolate (CELLCEPT) 500 MG tablet Take 2 tablets by mouth 2 (two) times daily. 05/21/22   [provider]    Physical Exam: BP (!) 146/60 (BP Location: Left Arm)   Pulse 75   Temp 98.2 F (36.8 C)   Resp 20   Ht '5\' 8"'$  (1.727 m)   Wt 120.8 kg   SpO2 99%   BMI 40.51 kg/m   General: 81 y.o. year-old male well developed well nourished in no acute distress.  Alert and oriented x3. HEENT: NCAT, EOMI Neck: Supple, trachea medial Cardiovascular: Regular rate and rhythm with no rubs or gallops.  No thyromegaly or JVD noted.  No lower extremity edema. 2/4 pulses in all 4 extremities. Respiratory: Bilateral rhonchi in lower lobes on auscultation.  No rales.   Abdomen: Soft, nontender nondistended with normal bowel sounds x4 quadrants.  Muskuloskeletal: No cyanosis, clubbing or edema noted bilaterally Neuro: CN II-XII intact, strength 5/5 x 4, sensation, reflexes intact Skin: No ulcerative lesions noted or rashes Psychiatry: Judgement and insight appear normal. Mood is appropriate for condition and setting          Labs on Admission:  Basic Metabolic Panel: Recent Labs  Lab 07/27/22 1601  NA 139  K 4.8  CL 104  CO2 25  GLUCOSE 110*  BUN 44*  CREATININE 2.28*  CALCIUM 9.6   Liver Function Tests: No results for input(s): "AST", "ALT", "ALKPHOS", "BILITOT", "PROT", "ALBUMIN" in the last 168 hours. No results for input(s): "LIPASE", "AMYLASE" in the last 168 hours. No results for input(s): "AMMONIA" in the last 168 hours. CBC: Recent Labs  Lab 07/27/22 1601  WBC 7.8  HGB 10.4*  HCT 33.6*  MCV 97.1  PLT 228   Cardiac Enzymes: No results for input(s): "CKTOTAL", "CKMB", "CKMBINDEX", "TROPONINI" in the last 168 hours.  BNP (last 3 results) Recent Labs    07/10/22 1820  BNP 43.0    ProBNP (last 3 results) No results for input(s): "PROBNP" in the last 8760 hours.  CBG: No results for input(s): "GLUCAP" in the last 168  hours.  Radiological Exams on Admission: CT Head Wo Contrast  Result Date: 07/27/2022 CLINICAL DATA:  Patient complains of dizziness.  Vomiting. EXAM: CT HEAD WITHOUT CONTRAST TECHNIQUE: Contiguous axial images were obtained from the base of the skull through the vertex without intravenous contrast. RADIATION DOSE REDUCTION: This exam was performed according to the departmental dose-optimization program which includes automated exposure control, adjustment of the mA and/or kV according to patient size and/or use of iterative reconstruction technique. COMPARISON:  08/15/2020 FINDINGS: Brain: No evidence of acute infarction, hemorrhage, hydrocephalus, extra-axial collection or mass lesion/mass effect. Focal area of low-density within the right basal ganglia compatible with remote lacunar infarct. There also small low-attenuation foci within bilateral thalami concerning for chronic lacunar infarcts. There is mild diffuse low-attenuation within the subcortical and periventricular white matter compatible with chronic microvascular disease. Prominence of the sulci and ventricles compatible with brain atrophy. Vascular: No hyperdense vessel or unexpected calcification. Skull: Normal. Negative for fracture or focal lesion. Sinuses/Orbits: No acute finding. Other: None. IMPRESSION: 1. No acute intracranial abnormalities. 2. Chronic small vessel ischemic disease and brain atrophy. 3. Remote lacunar infarcts within the right basal ganglia and bilateral thalami. Electronically Signed   By: Kerby Moors M.D.   On: 07/27/2022 18:32    EKG: I independently viewed the EKG done and my findings are as followed: Normal sinus rhythm at rate of 63 bpm  Assessment/Plan Present on Admission:  BPH (benign prostatic hyperplasia)  DM (diabetes mellitus) type II controlled with renal manifestation (HCC)  Principal Problem:   Dizziness Active Problems:   Essential hypertension   DM (diabetes mellitus) type II controlled  with renal manifestation (HCC)   Mixed hyperlipidemia   Obesity, Class III, BMI 40-49.9 (morbid obesity) ([place])   History of stroke   CKD (chronic kidney disease), stage IV (HCC)   BPH (benign prostatic hyperplasia)   Nausea & vomiting  Dizziness CT head without contrast showed no acute intracranial abnormalities Continue fall precaution and neurochecks Continue meclizine Unfortunately, patient was allergic to contrast and will not be able to do CT angiography of head and neck Bilateral ultrasound of the carotid arteries will be done in the morning MRI of brain without contrast to be done in the morning PT/OT eval and treat Neurology will be consulted  in the morning and we shall await further recommendations.  Nausea and vomiting Continue Zofran  COPD (not in acute exacerbation) Continue Proventil, [place]  Chronic kidney disease stage IV BUN/creatinine 44/2.28 (baseline creatinine at 2.1-2.3) Renally adjust medications, avoid nephrotoxic agents/dehydration/hypotension  Essential hypertension Continue Lopressor and Imdur  Mixed hyperlipidemia Continue Lipitor  Type 2 diabetes mellitus Continue ISS and hypoglycemic protocol  BPH Continue Flomax  Morbid obesity (BMI 40.51) Diet and lifestyle modification  Prior CVA Continue statin Eliquis will be temporarily held at this time pending MRI of brain in the morning as a precaution for intracranial bleeding   DVT prophylaxis: SCDs  Code Status: Full code  Consults: Teleneurology  Family Communication: Wife at bedside (all questions answered to satisfaction)  Severity of Illness: The appropriate patient status for this patient is OBSERVATION. Observation status is judged to be reasonable and necessary in order to provide the required intensity of service to ensure the patient's safety. The patient's presenting symptoms, physical exam findings, and initial radiographic and laboratory data in the context of their medical  condition is felt to place them at decreased risk for further clinical deterioration. Furthermore, it is anticipated that the patient will be medically stable for discharge from the hospital within 2 midnights of admission.   Author: Bernadette Hoit, DO 07/27/2022 10:52 PM  For on call review [email or web address].

## 2022-07-27 NOTE — ED Notes (Addendum)
Pt c/o cramping behind left leg. Heat pack applied

## 2022-07-27 NOTE — ED Provider Notes (Signed)
[facility] EMERGENCY DEPARTMENT Provider Note   CSN: [id number] Arrival date & time: 07/27/22  1235     History  Chief Complaint  Patient presents with   Dizziness    [name] is a 81 y.o. male.   Dizziness   81 year old male presents emergency department with complaint of dizziness.  Patient states he was leaving an appointment with his pulmonologist at the [facility] around 9-10 am today when he was getting to his car and felt acute onset dizziness.  He states he feels like he is "unbalanced and moving sideways" even though he sitting stationary.  Denies exact feeling of room spinning around him.[name]  Has noted 4-5 episodes of emesis associated with same feelings of perceived dizziness.  Episodes are reported to last around 5 minutes in duration and seem to resolve with episodes of emesis.  Patient states that symptoms occur without changes of position when he is just sitting.  Denies history of similar symptoms in the past.  Denies weakness/sensory deficits, facial droop, visual changes from baseline, slurred speech, difficulty walking from baseline.  Patient has a history of multiple prior strokes of which residual effects include gait instability, bilateral lower extremity weakness.  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  Patient states that he ambulates with walker at baseline.  Denies chest pain, shortness of breath, abdominal pain, urinary symptoms, change in bowel habits, fever, chills, night sweats.  Denies fall or associated trauma since onset.  Past medical history significant for multiple CVA, CKD, hypertension, hyperlipidemia, morbid obesity, paroxysmal atrial fibrillation of which he is on Eliquis, CAD  Home Medications Prior to Admission medications   Medication Sig Start Date End Date Taking? Authorizing Provider  acetaminophen (TYLENOL) 650 MG CR tablet Take 1,300 mg by mouth every 8 (eight) hours as needed for pain.   Yes [provider]  albuterol (VENTOLIN HFA) 108 (90 Base) MCG/ACT inhaler Inhale 1-2 puffs into the lungs every 6 (six) hours as needed for shortness of breath. 03/23/22  Yes [provider]  apixaban (ELIQUIS) 5 MG TABS tablet Take 1 tablet (5 mg total) by mouth 2 (two) times daily. 04/28/22  Yes Satira Sark, MD  atorvastatin (LIPITOR) 80 MG tablet Take 40 mg by mouth every evening.   Yes [provider]  carboxymethylcellulose (REFRESH PLUS) 0.5 % SOLN Apply to eye. 03/09/22  Yes [provider]  Cyanocobalamin (VITAMIN B 12) 500 MCG TABS Take 1,000 mcg by mouth daily. 03/07/20  Yes Martorell, Modena Nunnery, MD  glucose-Vitamin C 4-0.006 GM CHEW chewable tablet Chew 4 tablets by mouth as directed. CHEW FOUR TABLETS BY MOUTH AS DIRECTED BY YOUR PROVIDER (REPEAT EVERY 15 MINUTES IF BLOOD SUGAR LESS THAN 70) 03/09/22  Yes [provider]  isosorbide mononitrate (IMDUR) 30 MG 24 hr tablet Take 1 tablet (30 mg total) by mouth every evening. 01/08/22 07/21/23 Yes Satira Sark, MD  metoprolol tartrate (LOPRESSOR) 50 MG tablet Take 25 mg by mouth 2 (two) times daily.   Yes [provider]  mycophenolate (CELLCEPT) 500 MG tablet Take 2 tablets by mouth 2 (two) times daily. 05/21/22  Yes [provider]  pioglitazone (ACTOS) 30 MG tablet Take 30 mg by mouth daily. 03/09/22  Yes [provider]  Semaglutide (OZEMPIC, 0.25 OR 0.5 MG/DOSE, [place]) Inject 0.25 mg into the skin once a week. 02/26/22  Yes [provider]  spironolactone (ALDACTONE) 25 MG tablet Take 0.5 tablets (12.5 mg total) by  mouth daily. 07/20/22  Yes Satira Sark, MD  tamsulosin (FLOMAX) 0.4 MG CAPS capsule Take 0.4 mg by mouth every evening.   Yes [provider]  Tiotropium Bromide-Olodaterol 2.5-2.5 MCG/ACT AERS Inhale 2 puffs into the lungs daily. 03/23/22  Yes [provider]  torsemide (DEMADEX) 20 MG tablet Take 20 mg by mouth daily.   Yes [provider]  Vitamin D, Ergocalciferol, (DRISDOL) 1.25 MG (50000 UNIT) CAPS capsule Take 50,000 Units by mouth once a week. 06/02/22  Yes [provider]  amLODipine (NORVASC) 10 MG tablet Take 1 tablet (10 mg total) by mouth daily. 07/29/22   Johnson, Clanford L, MD  diazepam (VALIUM) 5 MG tablet Take 0.5 tablets (2.5 mg total) by mouth every 12 (twelve) hours as needed for up to 5 days (dizziness). 07/29/22 08/03/22  Johnson, Clanford L, MD  insulin aspart protamine- aspart (NOVOLOG MIX 70/30) (70-30) 100 UNIT/ML injection Inject 0.4 mLs (40 Units total) into the skin 2 (two) times daily with a meal. 07/29/22   Johnson, Clanford L, MD      Allergies    Ivp dye [iodinated contrast media]    Review of Systems   Review of Systems  Neurological:  Positive for dizziness.  All other systems reviewed and are negative.   Physical Exam Updated Vital Signs BP 114/61 (BP Location: Left Arm)   Pulse 68   Temp 98.5 F (36.9 C) (Oral)   Resp 16   Ht '5\' 8"'$  (1.727 m)   Wt (!) 162.2 kg   SpO2 98%   BMI 54.37 kg/m  Physical Exam Vitals and nursing note reviewed.  Constitutional:      General: He is not in acute distress.    Appearance: He is well-developed. He is obese. He is not ill-appearing, toxic-appearing or diaphoretic.  HENT:     Head: Normocephalic and atraumatic.     Right Ear: Tympanic membrane normal.     Left Ear: Tympanic membrane normal.  Eyes:     General:        Right eye: No discharge.        Left eye: No discharge.     Extraocular Movements: Extraocular movements intact.     Conjunctiva/sclera: Conjunctivae normal.     Pupils: Pupils are equal, round, and reactive to light.  Cardiovascular:     Rate and Rhythm: Normal rate and regular rhythm.     Heart sounds: No murmur heard. Pulmonary:     Effort: Pulmonary effort is normal. No respiratory distress.     Breath sounds: Rhonchi present.     Comments: Rhonchi auscultated in bilateral lower lung  fields. Abdominal:     Palpations: Abdomen is soft.     Tenderness: There is no abdominal tenderness. There is no guarding.  Musculoskeletal:        General: No swelling.     Cervical back: Normal range of motion and neck supple. No rigidity.     Right lower leg: No edema.     Left lower leg: No edema.  Skin:    General: Skin is warm and dry.     Capillary Refill: Capillary refill takes less than 2 seconds.  Neurological:     Mental Status: He is alert.     Comments: Alert and oriented to self, place, time and event.   Speech is fluent, clear without dysarthria or dysphasia.   Strength 5/5 in upper extremities, 4 out of 5 for bilateral lower extremities; patient states this is  baseline. Sensation intact in upper/lower extremities   Normal finger-to-nose and feet tapping.  CN I not tested  CN II grossly intact visual fields left.  Right eye significant visual impairment which is per baseline. Did not visualize posterior eye.  CN III, IV, VI PERRLA and EOMs intact bilaterally  CN V Intact sensation to sharp and light touch to the face  CN VII facial movements symmetric  CN VIII not tested  CN IX, X no uvula deviation, symmetric rise of soft palate  CN XI 5/5 SCM and trapezius strength bilaterally  CN XII Midline tongue protrusion, symmetric L/R movements   Hints exam -Cover-uncover test: No acute abnormalities -Nystagmus: No observable nystagmus -Test of skew: Within normal limits    Psychiatric:        Mood and Affect: Mood normal.     ED Results / Procedures / Treatments   Labs (all labs ordered are listed, but only abnormal results are displayed) Labs Reviewed  BASIC METABOLIC PANEL - Abnormal; Notable for the following components:      Result Value   Glucose, Bld 110 (*)    BUN 44 (*)    Creatinine, Ser 2.28 (*)    GFR, Estimated 28 (*)    All other components within normal limits  CBC - Abnormal; Notable for the following components:   RBC 3.46 (*)     Hemoglobin 10.4 (*)    HCT 33.6 (*)    All other components within normal limits  URINALYSIS, ROUTINE W REFLEX MICROSCOPIC - Abnormal; Notable for the following components:   Protein, ur 30 (*)    All other components within normal limits  COMPREHENSIVE METABOLIC PANEL - Abnormal; Notable for the following components:   Glucose, Bld 141 (*)    BUN 37 (*)    Creatinine, Ser 1.91 (*)    Calcium 8.8 (*)    GFR, Estimated 35 (*)    All other components within normal limits  CBC - Abnormal; Notable for the following components:   RBC 3.13 (*)    Hemoglobin 9.4 (*)    HCT 29.9 (*)    All other components within normal limits  GLUCOSE, CAPILLARY - Abnormal; Notable for the following components:   Glucose-Capillary 147 (*)    All other components within normal limits  GLUCOSE, CAPILLARY - Abnormal; Notable for the following components:   Glucose-Capillary 164 (*)    All other components within normal limits  GLUCOSE, CAPILLARY - Abnormal; Notable for the following components:   Glucose-Capillary 207 (*)    All other components within normal limits  GLUCOSE, CAPILLARY - Abnormal; Notable for the following components:   Glucose-Capillary 221 (*)    All other components within normal limits  GLUCOSE, CAPILLARY - Abnormal; Notable for the following components:   Glucose-Capillary 140 (*)    All other components within normal limits  GLUCOSE, CAPILLARY - Abnormal; Notable for the following components:   Glucose-Capillary 167 (*)    All other components within normal limits  MAGNESIUM  PHOSPHORUS  TROPONIN I (HIGH SENSITIVITY)  TROPONIN I (HIGH SENSITIVITY)    EKG EKG Interpretation  Date/Time:  Tuesday July 27 2022 16:56:49 EDT Ventricular Rate:  63 PR Interval:  211 QRS Duration: 105 QT Interval:  405 QTC Calculation: 415 R Axis:   -38 Text Interpretation: Sinus rhythm Abnormal R-wave progression, early transition Inferior infarct, old new t wave inversions lateral  compared with prior 9/23 Confirmed by Aletta Edouard [phone]) on 07/27/2022 4:59:46 PM  Radiology No  results found.  Procedures Procedures    Medications Ordered in ED Medications  metoCLOPramide (REGLAN) injection 10 mg (10 mg Intravenous Given 07/27/22 1744)  sodium chloride 0.9 % bolus 500 mL (500 mLs Intravenous Bolus 07/27/22 1743)  gadobutrol (GADAVIST) 1 MMOL/ML injection 10 mL (10 mLs Intravenous Contrast Given 07/28/22 0902)    ED Course/ Medical Decision Making/ A&P Clinical Course as of 07/30/22 1742  Tue Jul 27, 2022  1806 Reevaluation of the patient showed significant improvement with administration of Reglan.  Patient transferred to CT imaging of head. [CR]  1951 Consulted Dr. Quinn Axe of neurology.  She recommended MR brain without as well as imaging of neck and head with contrast regarding patient's current symptoms.  She agreed that symptoms sound peripheral in nature given resolution of symptoms once occur.  She agreed with admission of the patient given high fall risk at baseline with new vertiginous type symptoms, fall risk increased exponentially.  As needed meclizine as well as Zofran ordered as well as PT eval.  Patient has contrast dye allergy so MR brain with and without as well as neck with and without ordered. [CR]  1954 Consulted hospital medicine Dr. Josephine Cables regarding the patient.  He agreed with admission of the patient and assume further treatment/care. [CR]    Clinical Course User Index [CR] Wilnette Kales, PA                           Medical Decision Making Amount and/or Complexity of Data Reviewed Labs: ordered. Radiology: ordered.  Risk Prescription drug management. Decision regarding hospitalization.   This patient presents to the ED for concern of dizziness, this involves an extensive number of treatment options, and is a complaint that carries with it a high risk of complications and morbidity.  The differential diagnosis includes CVA,  malignancy, electrolyte abnormality, toxic ingestion, BPPV, lab regardless, Mnire's disease, ruptured TM   Co morbidities that complicate the patient evaluation  See HPI   Additional history obtained:  Additional history obtained from EMR External records from outside source obtained and reviewed including prior MRI brain from 08/15/2020   Lab Tests:  I Ordered, and personally interpreted labs.  The pertinent results include: UA significant for 30 protein but otherwise unremarkable.  Initial troponin   Imaging Studies ordered:  I ordered imaging studies including CT head without contrast I independently visualized and interpreted imaging which showed no acute abnormalities.  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  Remote lacunar infarcts within the right basal ganglia and bilateral [name] MRI.  MRI brain with and without as well as angiography of neck pending upon admission. I agree with the radiologist interpretation  Cardiac Monitoring: / EKG:  The patient was maintained on a cardiac monitor.  I personally viewed and interpreted the cardiac monitored which showed an underlying rhythm of: Sinus rhythm with new T wave abnormalities in the lateral lead.   Consultations Obtained:  See ED course  Problem List / ED Course / Critical interventions / Medication management  Dizziness I ordered medication including Reglan for dizziness   Reevaluation of the patient after these medicines showed that the patient improved I have reviewed the patients home medicines and have made adjustments as needed   Social Determinants of Health:  Multiple prior strokes with baseline gait abnormalities.  Former cigarette use.  Denies illicit drug use.   Test / Admission - Considered:  Dizziness Vitals signs significant for hypertension with  a blood pressure persistently elevated above 130/60.  Recommend close follow-up with PCP regarding elevation of blood pressure..  Otherwise within normal range and stable throughout visit. Laboratory/imaging studies significant for: See above Patient deemed to meet admission criteria given recurrent episodes of dizziness.  Dizziness most likely peripheral in nature but cannot exclude CVA and is additionally concerned because of patient's longstanding history of multiple strokes.  MRI imaging unavailable due to the hour of the day.  I discussed with neurology regarding the patient and they agreed for further imaging as well admission of the patient inpatient.  Patient has baseline gait abnormalities and if he were to experience vertiginous episode while ambulating, he is at extremely high risk of brain bleed given use of Eliquis currently.  I discussed at length with patient and family regarding said treatment plan and they were agreeable.  Hospital medicine was then consulted regarding the patient and agreed with admission of the patient.  Patient was stable upon admission to the hospital.        Final Clinical Impression(s) / ED Diagnoses Final diagnoses:  Dizziness    Rx / DC Orders ED Discharge Orders          Ordered    amLODipine (NORVASC) 10 MG tablet  Daily        07/29/22 0934    insulin aspart protamine- aspart (NOVOLOG MIX 70/30) (70-30) 100 UNIT/ML injection  2 times daily with meals        07/29/22 0934    diazepam (VALIUM) 5 MG tablet  Every 12 hours PRN        07/29/22 0934    Ambulatory referral to Physical Therapy       Comments: [facility]ST[facility] REHAB   07/29/22 [place], [place], PA 07/30/22 1742    Hayden Rasmussen, MD 07/31/22 1155

## 2022-07-28 ENCOUNTER — Observation Stay (HOSPITAL_COMMUNITY): Payer: No Typology Code available for payment source

## 2022-07-28 DIAGNOSIS — E1122 Type 2 diabetes mellitus with diabetic chronic kidney disease: Secondary | ICD-10-CM | POA: Diagnosis not present

## 2022-07-28 DIAGNOSIS — N183 Chronic kidney disease, stage 3 unspecified: Secondary | ICD-10-CM

## 2022-07-28 DIAGNOSIS — R55 Syncope and collapse: Secondary | ICD-10-CM | POA: Diagnosis not present

## 2022-07-28 DIAGNOSIS — N184 Chronic kidney disease, stage 4 (severe): Secondary | ICD-10-CM | POA: Diagnosis not present

## 2022-07-28 DIAGNOSIS — Z794 Long term (current) use of insulin: Secondary | ICD-10-CM

## 2022-07-28 DIAGNOSIS — I639 Cerebral infarction, unspecified: Secondary | ICD-10-CM | POA: Diagnosis not present

## 2022-07-28 DIAGNOSIS — G319 Degenerative disease of nervous system, unspecified: Secondary | ICD-10-CM | POA: Diagnosis not present

## 2022-07-28 DIAGNOSIS — I6523 Occlusion and stenosis of bilateral carotid arteries: Secondary | ICD-10-CM | POA: Diagnosis not present

## 2022-07-28 DIAGNOSIS — R42 Dizziness and giddiness: Secondary | ICD-10-CM | POA: Diagnosis not present

## 2022-07-28 DIAGNOSIS — N400 Enlarged prostate without lower urinary tract symptoms: Secondary | ICD-10-CM | POA: Diagnosis not present

## 2022-07-28 LAB — MAGNESIUM: Magnesium: 2.1 mg/dL (ref 1.7–2.4)

## 2022-07-28 LAB — CBC
HCT: 29.9 % — ABNORMAL LOW (ref 39.0–52.0)
Hemoglobin: 9.4 g/dL — ABNORMAL LOW (ref 13.0–17.0)
MCH: 30 pg (ref 26.0–34.0)
MCHC: 31.4 g/dL (ref 30.0–36.0)
MCV: 95.5 fL (ref 80.0–100.0)
Platelets: 215 10*3/uL (ref 150–400)
RBC: 3.13 MIL/uL — ABNORMAL LOW (ref 4.22–5.81)
RDW: 13.4 % (ref 11.5–15.5)
WBC: 7.2 10*3/uL (ref 4.0–10.5)
nRBC: 0 % (ref 0.0–0.2)

## 2022-07-28 LAB — COMPREHENSIVE METABOLIC PANEL
ALT: 18 U/L (ref 0–44)
AST: 20 U/L (ref 15–41)
Albumin: 3.5 g/dL (ref 3.5–5.0)
Alkaline Phosphatase: 52 U/L (ref 38–126)
Anion gap: 7 (ref 5–15)
BUN: 37 mg/dL — ABNORMAL HIGH (ref 8–23)
CO2: 23 mmol/L (ref 22–32)
Calcium: 8.8 mg/dL — ABNORMAL LOW (ref 8.9–10.3)
Chloride: 108 mmol/L (ref 98–111)
Creatinine, Ser: 1.91 mg/dL — ABNORMAL HIGH (ref 0.61–1.24)
GFR, Estimated: 35 mL/min — ABNORMAL LOW (ref >60)
Glucose, Bld: 141 mg/dL — ABNORMAL HIGH (ref 70–99)
Potassium: 3.9 mmol/L (ref 3.5–5.1)
Sodium: 138 mmol/L (ref 135–145)
Total Bilirubin: 0.9 mg/dL (ref 0.3–1.2)
Total Protein: 7.2 g/dL (ref 6.5–8.1)

## 2022-07-28 LAB — GLUCOSE, CAPILLARY
Glucose-Capillary: 164 mg/dL — ABNORMAL HIGH (ref 70–99)
Glucose-Capillary: 207 mg/dL — ABNORMAL HIGH (ref 70–99)
Glucose-Capillary: 221 mg/dL — ABNORMAL HIGH (ref 70–99)
Glucose-Capillary: 140 mg/dL — ABNORMAL HIGH (ref 70–99)

## 2022-07-28 LAB — PHOSPHORUS: Phosphorus: 3.3 mg/dL (ref 2.5–4.6)

## 2022-07-28 MED ORDER — SPIRONOLACTONE 12.5 MG HALF TABLET
12.5000 mg | ORAL_TABLET | Freq: Every day | ORAL | Status: DC
  Administered 2022-07-28 – 2022-07-29 (×2): 12.5 mg via ORAL
  Filled 2022-07-28 (×2): qty 1

## 2022-07-28 MED ORDER — GADOBUTROL 1 MMOL/ML IV SOLN
10.0000 mL | Freq: Once | INTRAVENOUS | Status: AC | PRN
  Administered 2022-07-28: 10 mL via INTRAVENOUS

## 2022-07-28 MED ORDER — AMLODIPINE BESYLATE 5 MG PO TABS
10.0000 mg | ORAL_TABLET | Freq: Every day | ORAL | Status: DC
  Administered 2022-07-28 – 2022-07-29 (×2): 10 mg via ORAL
  Filled 2022-07-28 (×2): qty 2

## 2022-07-28 MED ORDER — DIAZEPAM 5 MG PO TABS: 2.5000 mg | ORAL_TABLET | Freq: Every day | ORAL | Status: DC | PRN

## 2022-07-28 MED ORDER — INSULIN ASPART PROT & ASPART (70-30 MIX) 100 UNIT/ML ~~LOC~~ SUSP
25.0000 [IU] | Freq: Two times a day (BID) | SUBCUTANEOUS | Status: DC
  Administered 2022-07-28 – 2022-07-29 (×2): 25 [IU] via SUBCUTANEOUS
  Filled 2022-07-28: qty 10

## 2022-07-28 MED ORDER — ATORVASTATIN CALCIUM 40 MG PO TABS
40.0000 mg | ORAL_TABLET | Freq: Every evening | ORAL | Status: DC
  Administered 2022-07-28: 40 mg via ORAL
  Filled 2022-07-28: qty 1

## 2022-07-28 MED ORDER — MYCOPHENOLATE MOFETIL 250 MG PO CAPS
1000.0000 mg | ORAL_CAPSULE | Freq: Two times a day (BID) | ORAL | Status: DC
  Administered 2022-07-28 – 2022-07-29 (×2): 1000 mg via ORAL
  Filled 2022-07-28 (×2): qty 4

## 2022-07-28 MED ORDER — DIAZEPAM 5 MG PO TABS: 5.0000 mg | ORAL_TABLET | Freq: Two times a day (BID) | ORAL | Status: DC | PRN

## 2022-07-28 MED ORDER — DIAZEPAM 5 MG PO TABS: 2.5000 mg | ORAL_TABLET | Freq: Two times a day (BID) | ORAL | Status: DC | PRN

## 2022-07-28 MED ORDER — TORSEMIDE 20 MG PO TABS
40.0000 mg | ORAL_TABLET | Freq: Every day | ORAL | Status: DC
  Administered 2022-07-29: 40 mg via ORAL
  Filled 2022-07-28 (×2): qty 2

## 2022-07-28 MED ORDER — APIXABAN 5 MG PO TABS
5.0000 mg | ORAL_TABLET | Freq: Two times a day (BID) | ORAL | Status: DC
  Administered 2022-07-28 – 2022-07-29 (×3): 5 mg via ORAL
  Filled 2022-07-28 (×3): qty 1

## 2022-07-28 NOTE — Evaluation (Signed)
Occupational Therapy Evaluation Patient Details Name: [name] MRN: [id number] DOB: July 12, 1941 Today's Date: 07/28/2022   History of Present Illness [name] is a 81 y.o. 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  Patient went to see his pulmonologist at the [facility] this morning around 9-10 AM, on trying to enter his car after leaving pulmonologist office, he felt dizzy which was described as " unbalance and moving sideways" despite sitting in 1 place.  He has had about 4-5 episodes of the dizziness and this is usually associated with a projectile nonbloody vomiting which subsequently resulted in symptom subsiding.  Changing of position does not affect the symptoms.  Patient denies fever, chills, chest pain, shortness of breath, diarrhea or constipation. (per DO)   Clinical Impression   Pt agreeable to OT and PT co-evaluation. Pt assisted for lower body ADL's at baseline or uses sock aide. Pt demonstrates dizziness with L lateral lean intermittently during ambulation with RW. Gait belt used throughout session. Wife able to assist pt to toilet and back with verbal cuing for safety and positioning. Pt demonstrates mild L UE limitations in A/ROM but no significant weakness noted.  Family reported preference for home health verus rehab reporting ability to help pt when transferring at all times. Pt left in bed with call bell within reach. Pt will benefit from continued OT in the hospital and recommended venue below to increase strength, balance, and endurance for safe ADL's.        Recommendations for follow up therapy are one component of a multi-disciplinary discharge planning process, led by the attending physician.   Recommendations may be updated based on patient status, additional functional criteria and insurance authorization.   Follow Up Recommendations  Home health OT    Assistance Recommended at Discharge Intermittent Supervision/Assistance  Patient can return home with the following A little help with walking and/or transfers;A lot of help with bathing/dressing/bathroom;Help with stairs or ramp for entrance;Assist for transportation;Assistance with cooking/housework    Functional Status Assessment  Patient has had a recent decline in their functional status and demonstrates the ability to make significant improvements in function in a reasonable and predictable amount of time.  Equipment Recommendations  None recommended by OT    Recommendations for Other Services       Precautions / Restrictions Precautions Precautions: Fall Restrictions Weight Bearing Restrictions: No      Mobility Bed Mobility Overal bed mobility: Independent             General bed mobility comments: No issues.    Transfers Overall transfer level: Needs assistance Equipment used: Rolling walker (2 wheels) (gait belt) Transfers: Sit to/from Stand, Bed to chair/wheelchair/BSC Sit to Stand: Min guard, Min assist     Step pivot transfers: Min assist     General transfer comment: min A due to loss of balance to L side intermtently while using RW.      Balance Overall balance assessment: Needs assistance Sitting-balance support: No upper extremity supported, Feet supported Sitting balance-Leahy Scale: Good Sitting balance - Comments: seated EOB   Standing balance support: Bilateral upper extremity supported, During functional activity, Reliant on assistive device for balance Standing balance-Leahy Scale: Fair Standing balance comment: poor to fair; L lean at times  ADL either performed or assessed with clinical judgement   ADL Overall ADL's : Needs  assistance/impaired     Grooming: Minimal assistance;Standing   Upper Body Bathing: Modified independent;Sitting   Lower Body Bathing: Maximal assistance;Sitting/lateral leans Lower Body Bathing Details (indicate cue type and reason): Pt is assited at baseline.     Lower Body Dressing: Maximal assistance;Sitting/lateral leans Lower Body Dressing Details (indicate cue type and reason): Pt reports being assited or using sock aide at baseline. Toilet Transfer: Minimal assistance;Ambulation;Rolling walker (2 wheels) Toilet Transfer Details (indicate cue type and reason): Wife assited pt to tiolet and back to bed with RW. Unstead with L lateral lean at tiems. Toileting- Water quality scientist and Hygiene: Min guard;Sitting/lateral lean;Sit to/from stand       Functional mobility during ADLs: Minimal assistance;Rolling walker (2 wheels) General ADL Comments: Pt noted to lose balance to L side intermtitently needing assit to correct. Able to ambulate in hall >30 feet with RW.     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 Possbily baseline from previous stroke. No significant weakness in L UE. LUE Sensation: WNL LUE Coordination: WNL   Lower Extremity Assessment Lower Extremity Assessment: Defer to PT evaluation   Cervical / Trunk Assessment Cervical / Trunk  Assessment: Normal   Communication Communication Communication: No difficulties   Cognition Arousal/Alertness: Awake/alert Behavior During Therapy: WFL for tasks assessed/performed Overall Cognitive Status: Within Functional Limits for tasks assessed                                                        Home Living Family/patient expects to be discharged to:: Private residence Living Arrangements: Spouse/significant other Available Help at Discharge: Family;Available 24 hours/day Type of Home: House Home Access: Stairs to enter [employer] of Steps: 2 Entrance Stairs-Rails: Can reach both;Right;Left Home Layout: Multi-level;Able to live on main level with bedroom/bathroom     Bathroom Shower/Tub: Occupational psychologist: Standard Bathroom Accessibility: No   Home Equipment: Conservation officer, nature (2 wheels);Rollator (4 wheels);BSC/3in1;Shower seat - built in;Electric scooter;Cane - single point;Wheelchair - manual          Prior Functioning/Environment Prior Level of Function : Needs assist       Physical Assist : Mobility (physical);ADLs (physical)   ADLs (physical): Bathing;Dressing;IADLs Mobility Comments: Mostly household ambulator with RW; goes to church in community. ADLs Comments: Assited for lower body bathing and dressing. Indepndent toileting. Assisted with IADL's by family.        OT Problem List: Decreased activity tolerance;Impaired balance (sitting and/or standing)      OT Treatment/Interventions: Self-care/ADL training;Therapeutic exercise;Therapeutic activities;Patient/family education;Balance training    OT Goals(Current goals can be found in the care plan section) Acute Rehab OT Goals  Patient Stated Goal: return home OT Goal Formulation: With patient Time For Goal Achievement: 08/11/22 Potential to Achieve Goals: Good  OT Frequency: Min 1X/week    Co-evaluation PT/OT/SLP Co-Evaluation/Treatment:  Yes Reason for Co-Treatment: To address functional/ADL transfers   OT goals addressed during session: ADL's and self-care      AM-PAC OT "6 Clicks" Daily Activity     Outcome Measure Help from another person eating meals?: None Help from another person taking care of personal grooming?: A Little Help from another person toileting, which includes using toliet, bedpan, or urinal?: A Little Help from another person bathing (including washing, rinsing, drying)?: A Lot Help from another person to put on and taking off regular upper body clothing?: None Help from another person to put on and taking off regular lower body clothing?: A Lot 6 Click Score: 18   End of Session Equipment Utilized During Treatment: Rolling walker (2 wheels);Gait belt  Activity Tolerance: Patient tolerated treatment well Patient left: in bed;with call bell/phone within reach;with family/visitor present  OT Visit Diagnosis: Other abnormalities of gait and mobility (R26.89);Unsteadiness on feet (R26.81)                Time: 3568-6168 OT Time Calculation (min): 21 min Charges:  OT General Charges $OT Visit: 1 Visit OT Evaluation $OT Eval Low Complexity: 1 Low  Ashantae Pangallo OT, MOT  Larey Seat 07/28/2022, 10:11 AM

## 2022-07-28 NOTE — Plan of Care (Signed)
  Problem: Acute Rehab OT Goals (only OT should resolve) Goal: Pt. Will Perform Grooming Flowsheets (Taken 07/28/2022 1014) Pt Will Perform Grooming:  with modified independence  standing Goal: Pt. Will Perform Lower Body Dressing Flowsheets (Taken 07/28/2022 1014) Pt Will Perform Lower Body Dressing:  with modified independence  sitting/lateral leans  with adaptive equipment Goal: Pt. Will Transfer To Toilet Flowsheets (Taken 07/28/2022 1014) Pt Will Transfer to Toilet:  with modified independence  ambulating   Manroop Jakubowicz OT, MOT

## 2022-07-28 NOTE — Evaluation (Signed)
Physical Therapy Evaluation Patient Details Name: [name] MRN: [id number] DOB: January 21, 1941 Today's Date: 07/28/2022  History of Present Illness  [name] is a 81 y.o. 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  Patient went to see his pulmonologist at the [facility] this morning around 9-10 AM, on trying to enter his car after leaving pulmonologist office, he felt dizzy which was described as " unbalance and moving sideways" despite sitting in 1 place.  He has had about 4-5 episodes of the dizziness and this is usually associated with a projectile nonbloody vomiting which subsequently resulted in symptom subsiding.  Changing of position does not affect the symptoms.  Patient denies fever, chills, chest pain, shortness of breath, diarrhea or constipation.   Clinical Impression  Patient min guard assist with sit to stand transfer due to weakness and unsteadiness on feet. Patient required min assist for gait with RW due to left lean and occasional losses of balance to the left with patient reporting dizziness and limited by fatigue. Patient tolerated sitting EOB after therapy with family present in room. Patient will benefit from continued skilled physical therapy in hospital and recommended venue below to increase strength, balance, endurance for safe ADLs and gait.     Recommendations for follow up therapy are one component of a multi-disciplinary discharge planning process, led by the attending physician.  Recommendations may be updated based on patient status, additional functional criteria and insurance authorization.  Follow Up Recommendations Home health PT      Assistance Recommended at Discharge Set up  Supervision/Assistance  Patient can return home with the following  A little help with bathing/dressing/bathroom;A little help with walking and/or transfers;Assistance with cooking/housework;Help with stairs or ramp for entrance    Equipment Recommendations None recommended by PT  Recommendations for Other Services       Functional Status Assessment Patient has had a recent decline in their functional status and demonstrates the ability to make significant improvements in function in a reasonable and predictable amount of time.     Precautions / Restrictions Precautions Precautions: Fall Restrictions Weight Bearing Restrictions: No      Mobility  Bed Mobility Overal bed mobility: Independent             General bed mobility comments: patient demonstrates good return for supine to sit transfer    Transfers Overall transfer level: Needs assistance Equipment used: Rolling walker (2 wheels) Transfers: Sit to/from Stand, Bed to chair/wheelchair/BSC Sit to Stand: Min guard, Min assist   Step pivot transfers: Min assist       General transfer comment: min assist for safe sit to stand transfer    Ambulation/Gait Ambulation/Gait assistance: Min assist, Min guard Gait Distance (Feet): 25 Feet Assistive device: Rolling walker (2 wheels) Gait Pattern/deviations: Staggering left, Decreased step length - right, Decreased step length - left, Decreased stride length Gait velocity: decreased     General Gait Details: Patient with several moments of loss of balance to the left requiring min assist with patient reporting dizziness. Patient very unsteady on feet requiring use of RW and min assist for safe ambulation  Stairs            Wheelchair Mobility    Modified Rankin (Stroke Patients Only)  Balance Overall balance assessment: Needs assistance Sitting-balance support: No upper extremity supported, Feet supported Sitting balance-Leahy Scale: Good Sitting  balance - Comments: seated EOB   Standing balance support: Bilateral upper extremity supported, During functional activity, Reliant on assistive device for balance Standing balance-Leahy Scale: Fair Standing balance comment: poor/fair with occasional left lean requiring use of RW and min guard/min assist                             Pertinent Vitals/Pain Pain Assessment Pain Assessment: No/denies pain    Home Living Family/patient expects to be discharged to:: Private residence Living Arrangements: Spouse/significant other Available Help at Discharge: Family;Available 24 hours/day Type of Home: House Home Access: Stairs to enter Entrance Stairs-Rails: Can reach both;Right;Left Entrance Stairs-Number of Steps: 2   Home Layout: Multi-level;Able to live on main level with bedroom/bathroom Home Equipment: Rolling Walker (2 wheels);Rollator (4 wheels);BSC/3in1;Shower seat - built in;Electric scooter;Cane - single point;Wheelchair - manual      Prior Function Prior Level of Function : Needs assist       Physical Assist : Mobility (physical);ADLs (physical) Mobility (physical): Transfers;Gait;Stairs ADLs (physical): Dressing;IADLs;Bathing Mobility Comments: Mostly household ambulator with RW, patients reports occasionally going to the [place] and church in community ADLs Comments: refer to OT notes     Hand Dominance   Dominant Hand: Right    Extremity/Trunk Assessment   Upper Extremity Assessment Upper Extremity Assessment: Defer to OT evaluation LUE Deficits / Details: Mild L UE A/ROM deficit. Possbily baseline from previous stroke. No significant weakness in L UE. LUE Sensation: WNL LUE Coordination: WNL    Lower Extremity Assessment Lower Extremity Assessment: Generalized weakness    Cervical / Trunk Assessment Cervical / Trunk Assessment: Normal  Communication   Communication: No difficulties  Cognition Arousal/Alertness: Awake/alert Behavior During  Therapy: WFL for tasks assessed/performed Overall Cognitive Status: Within Functional Limits for tasks assessed                                          General Comments      Exercises     Assessment/Plan    PT Assessment Patient needs continued PT services  PT Problem List Decreased strength;Decreased activity tolerance;Decreased balance;Decreased mobility       PT Treatment Interventions DME instruction;Gait training;Stair training;Functional mobility training;Therapeutic activities;Therapeutic exercise;Patient/family education;Balance training    PT Goals (Current goals can be found in the Care Plan section)  Acute Rehab PT Goals Patient Stated Goal: return home with family to assist PT Goal Formulation: With patient/family Time For Goal Achievement: 08/11/22 Potential to Achieve Goals: Good    Frequency Min 3X/week     Co-evaluation PT/OT/SLP Co-Evaluation/Treatment: Yes Reason for Co-Treatment: To address functional/ADL transfers;Complexity of the patient's impairments (multi-system involvement) PT goals addressed during session: Mobility/safety with mobility;Balance;Proper use of DME OT goals addressed during session: ADL's and self-care       AM-PAC PT "6 Clicks" Mobility  Outcome Measure Help needed turning from your back to your side while in a flat bed without using bedrails?: None Help needed moving from lying on your back to sitting on the side of a flat bed without using bedrails?: None Help needed moving to and from a bed to a chair (including a wheelchair)?: A Little Help needed standing up from a chair using your arms (e.g., wheelchair or bedside chair)?:  A Little Help needed to walk in hospital room?: A Lot Help needed climbing 3-5 steps with a railing? : A Lot 6 Click Score: 18    End of Session   Activity Tolerance: Patient tolerated treatment well;Patient limited by fatigue Patient left: in bed;with call bell/phone within reach  (seated EOB) Nurse Communication: Mobility status PT Visit Diagnosis: Unsteadiness on feet (R26.81);Other abnormalities of gait and mobility (R26.89);Muscle weakness (generalized) (M62.81)    Time: 4174-0814 PT Time Calculation (min) (ACUTE ONLY): 24 min   Charges:   PT Evaluation $PT Eval Moderate Complexity: 1 Mod PT Treatments $Therapeutic Activity: 23-37 mins       Zigmund Gottron, SPT

## 2022-07-28 NOTE — Progress Notes (Signed)
  Transition of Care [facility]) Screening Note   Patient Details  Name: [name] Date of Birth: [birth year]   [facility]) CM/SW Contact:    Ihor Gully, LCSW Phone Number: 07/28/2022, 11:51 AM    Transition of Care Department [facility]) has reviewed patient and no TOC needs have been identified at this time. We will continue to monitor patient advancement through interdisciplinary progression rounds. If new patient transition needs arise, please place a TOC consult.

## 2022-07-28 NOTE — Plan of Care (Signed)
  Problem: Acute Rehab PT Goals(only PT should resolve) Goal: Pt Will Go Supine/Side To Sit Outcome: Progressing Flowsheets (Taken 07/28/2022 1226) Pt will go Supine/Side to Sit:  Independently  with modified independence Goal: Patient Will Transfer Sit To/From Stand Outcome: Progressing Flowsheets (Taken 07/28/2022 1226) Patient will transfer sit to/from stand:  with modified independence  with supervision Goal: Pt Will Transfer Bed To Chair/Chair To Bed Outcome: Progressing Flowsheets (Taken 07/28/2022 1226) Pt will Transfer Bed to Chair/Chair to Bed: with supervision Goal: Pt Will Ambulate Outcome: Progressing Flowsheets (Taken 07/28/2022 1226) Pt will Ambulate:  with supervision  with min guard assist  with rolling walker  75 feet   Zigmund Gottron, SPT

## 2022-07-28 NOTE — Progress Notes (Signed)
Patient got dizzy with ambulation to toilet needed assistance and walker for stability. Patient had no complaints of pain this shift. This morning the patient vomited small amount twice. Patient slept though the night with exception for when vitals where obtained and this morning. Continuing to monitor.

## 2022-07-28 NOTE — Hospital Course (Signed)
81 y.o. 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  Patient went to see his pulmonologist at the [facility] this morning around 9-10 AM, on trying to enter his car after leaving pulmonologist office, he felt dizzy which was described as " unbalance and moving sideways" despite sitting in 1 place.  He has had about 4-5 episodes of the dizziness and this is usually associated with a projectile nonbloody vomiting which subsequently resulted in symptom subsiding.  Changing of position does not affect the symptoms.  Patient denies fever, chills, chest pain, shortness of breath, diarrhea or constipation. ED Course:  In the emergency department, BP was 142/64, but other vital signs were within normal range.  Work-up in the ED showed normocytic anemia, normal BMP except for glucose of 110 and BUN/creatinine 44/2.28 (baseline creatinine at 2.1-2.3).  Troponin x2 was negative CT head without contrast showed no acute intracranial abnormalities IV Reglan was given, IV hydration was provided.  Teleneurologist was consulted on recommended CTA head and neck and MRI of brain without contrast per ED PA.  Hospitalist was asked to admit patient for further evaluation and management

## 2022-07-28 NOTE — Progress Notes (Signed)
PROGRESS NOTE   [name]  RXV:[id number] DOB: 1941-04-02 DOA: 07/27/2022 PCP: Monico Blitz, MD   Chief Complaint  Patient presents with   Dizziness   Level of care: Telemetry  Brief Admission History:  81 y.o. 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  Patient went to see his pulmonologist at the [facility] this morning around 9-10 AM, on trying to enter his car after leaving pulmonologist office, he felt dizzy which was described as " unbalance and moving sideways" despite sitting in 1 place.  He has had about 4-5 episodes of the dizziness and this is usually associated with a projectile nonbloody vomiting which subsequently resulted in symptom subsiding.  Changing of position does not affect the symptoms.  Patient denies fever, chills, chest pain, shortness of breath, diarrhea or constipation. ED Course:  In the emergency department, BP was 142/64, but other vital signs were within normal range.  Work-up in the ED showed normocytic anemia, normal BMP except for glucose of 110 and BUN/creatinine 44/2.28 (baseline creatinine at 2.1-2.3).  Troponin x2 was negative CT head without contrast showed no acute intracranial abnormalities IV Reglan was given, IV hydration was provided.  Teleneurologist was consulted on recommended CTA head and neck and MRI of brain without contrast per ED PA.  Hospitalist was asked to admit patient for further evaluation and management   Assessment and Plan:  Dizziness CT head without contrast showed no acute intracranial abnormalities Continue fall precaution and neurochecks Continue meclizine Unfortunately, patient was allergic to contrast and will not be able to do CT angiography of head  and neck Bilateral ultrasound of the carotid arteries will be done in the morning MRI of brain without contrast completed  PT/OT eval and treat and home health recommended and ordered Neurology will be consulted for further recommendations.   Nausea and vomiting Continue Zofran   COPD (not in acute exacerbation) Continue Proventil, Brovana   Chronic kidney disease stage IV BUN/creatinine 44/2.28 (baseline creatinine at 2.1-2.3) Renally adjust medications, avoid nephrotoxic agents/dehydration/hypotension   Essential hypertension Continue Lopressor and Imdur   Mixed hyperlipidemia Continue Lipitor   Type 2 diabetes mellitus Continue ISS and hypoglycemic protocol   BPH Continue Flomax   Morbid obesity (BMI 40.51) Diet and lifestyle modification   Prior CVA Continue statin Apixaban can be restarted per neurologist  DVT prophylaxis: apixaban  Code Status: full  Family Communication: bedside  Disposition: Status is: Observation The patient remains OBS appropriate and will d/c before 2 midnights.   Consultants:  Neurology Dr. Hortense Ramal Procedures:   Antimicrobials:    Subjective: Pt reports he had a couple of spells of dizziness overnight but feels fine now.  Eating and drinking with no difficulty.   Objective: Vitals:   07/28/22 0652 07/28/22 0737 07/28/22 0950 07/28/22 1343  BP:   (!) 139/96 (!) 146/70  Pulse:  83 66 70  Resp:  16  16  Temp:    98.5 F (36.9 C)  TempSrc:    Oral  SpO2:  99%  100%  Weight: (!) 162.2 kg     Height:        Intake/Output Summary (Last 24 hours) at 07/28/2022 1411 Last data filed at 07/28/2022 0900 Gross per 24 hour  Intake 240 ml  Output --  Net 240 ml   Filed Weights   07/27/22 1305 07/27/22 2042 07/28/22 0652  Weight: 119.7 kg 120.8 kg (!) 162.2 kg   Examination:  General exam: morbidly obese male, Appears calm and comfortable  Respiratory system: Clear to auscultation. Respiratory effort normal. Cardiovascular system:  normal S1 & S2 heard. No JVD, murmurs, rubs, gallops or clicks. No pedal edema. Gastrointestinal system: Abdomen is nondistended, soft and nontender. No organomegaly or masses felt. Normal bowel sounds heard. Central nervous system: Alert and oriented. No focal neurological deficits. Extremities: Symmetric 5 x 5 power. Skin: No rashes, lesions or ulcers. Psychiatry: Judgement and insight appear normal. Mood & affect appropriate.   Data Reviewed: I have personally reviewed following labs and imaging studies  CBC: Recent Labs  Lab 07/27/22 1601 07/28/22 0431  WBC 7.8 7.2  HGB 10.4* 9.4*  HCT 33.6* 29.9*  MCV 97.1 95.5  PLT 228 665    Basic Metabolic Panel: Recent Labs  Lab 07/27/22 1601 07/28/22 0431  NA 139 138  K 4.8 3.9  CL 104 108  CO2 25 23  GLUCOSE 110* 141*  BUN 44* 37*  CREATININE 2.28* 1.91*  CALCIUM 9.6 8.8*  MG  --  2.1  PHOS  --  3.3    CBG: Recent Labs  Lab 07/27/22 2304 07/28/22 0711 07/28/22 1118  GLUCAP 147* 164* 207*    No results found for this or any previous visit (from the past 240 hour(s)).   Radiology Studies: MR ANGIO HEAD WO CONTRAST  Result Date: 07/28/2022 CLINICAL DATA:  Stroke follow up EXAM: MRA HEAD WITHOUT CONTRAST TECHNIQUE: Angiographic images of the Circle of Willis were acquired using MRA technique without intravenous contrast. COMPARISON:  None Available. FINDINGS: Anterior circulation: No evidence of high grade stenosis, aneurysm, or dissection. Posterior circulation: Focal severe stenosis at the P2/P3 junction on the right (series 100, image 120). Left AICA is poorly visualized. Anatomic variants: None Other: None. 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 Electronically Signed   By: Marin Roberts M.D.   On: 07/28/2022 13:15   US Carotid Bilateral  Result Date: 07/28/2022 CLINICAL DATA:  Dizziness and syncope EXAM: BILATERAL CAROTID DUPLEX ULTRASOUND TECHNIQUE: Pearline Cables scale imaging, color Doppler and  duplex ultrasound were performed of bilateral carotid and vertebral arteries in the neck. COMPARISON:  None Available. FINDINGS: Criteria: Quantification of carotid stenosis is based on velocity parameters that correlate the residual internal carotid diameter with NASCET-based stenosis levels, using the diameter of the distal internal carotid lumen as the denominator for stenosis measurement. The following velocity measurements were obtained: RIGHT ICA: 104/13 cm/sec CCA: 99/3 cm/sec SYSTOLIC ICA/CCA RATIO:  1.3 ECA:  89 cm/sec LEFT ICA: 89/11 cm/sec CCA: 570/17 cm/sec SYSTOLIC ICA/CCA RATIO:  0.7 ECA:  131 cm/sec RIGHT CAROTID ARTERY: Mild heterogeneous atherosclerotic plaque in the proximal internal carotid artery. By peak systolic velocity criteria, the estimated stenosis is less than 50%. RIGHT VERTEBRAL ARTERY:  Patent with normal antegrade flow. LEFT CAROTID ARTERY: Mild heterogeneous atherosclerotic plaque in the proximal internal carotid artery. By peak systolic velocity criteria, the estimated stenosis remains less than 50%. LEFT VERTEBRAL ARTERY:  Patent with normal antegrade flow. IMPRESSION: 1. Mild (1-49%) stenosis proximal right internal carotid artery secondary to heterogenous atherosclerotic plaque. 2. Mild (1-49%) stenosis proximal left internal carotid artery secondary to heterogenous atherosclerotic plaque. 3. Vertebral arteries are patent with normal antegrade flow. Signed, Criselda Peaches, MD, RPVI [facility] Electronically Signed   By:  Jacqulynn Cadet M.D.   On: 07/28/2022 11:35   MR Brain W and Wo Contrast  Result Date: 07/28/2022 CLINICAL DATA:  Provided history: Dizziness, persistent/recurrent, cardiac or vascular cause suspected. EXAM: MRI HEAD WITHOUT AND WITH CONTRAST MRA NECK WITHOUT AND WITH CONTRAST TECHNIQUE: Multiplanar, multi-echo pulse sequences of the brain and surrounding structures were acquired without and  with intravenous contrast. Angiographic images of the neck were acquired using MRA technique without and with intravenous contrast. Carotid stenosis measurements (when applicable) are obtained utilizing NASCET criteria, using the distal internal carotid diameter as the denominator. CONTRAST:  102m GADAVIST GADOBUTROL 1 MMOL/ML IV SOLN COMPARISON:  CT 07/27/2022. MRA head 08/15/2020. MRA neck 05/20/2011. FINDINGS: MRI HEAD FINDINGS Brain: Mild-to-moderate generalized parenchymal atrophy. Advanced patchy and confluent T2 FLAIR hyperintense signal abnormality within the cerebral white matter, nonspecific but compatible with chronic small vessel disease. Known chronic lacunar infarcts within the right centrum semiovale, right lentiform nucleus/internal capsule, left basal ganglia and bilateral thalami. Wallerian degeneration affecting the right cerebral peduncle. Mild chronic small vessel image changes within the pons. Redemonstrated chronic infarct within the left middle cerebellar peduncle. Small to moderate-sized chronic infarct within the left cerebellar hemisphere, new from the prior MRI. There is no acute infarct. No chronic intracranial blood products. No extra-axial fluid collection. No midline shift. No pathologic intracranial enhancement identified. Vascular: Maintained flow voids within the proximal large arterial vessels. Skull and upper cervical spine: No focal suspicious marrow lesion. Abnormal T1 hypointense marrow signal within the visualized upper cervical spine. Sinuses/Orbits: No mass or acute finding within the imaged orbits. Prior bilateral ocular lens replacement. Small mucous retention cyst within the left sphenoid sinus. Other: Trace fluid within the left mastoid air cells. MRA NECK FINDINGS Aortic arch: Standard aortic branching. The visualized aortic arch is normal in caliber. Right carotid system: CCA and ICA patent within the neck without hemodynamically significant stenosis (50% or greater).  Left carotid system: CCA and ICA patent within the neck without hemodynamically significant stenosis (50% or greater). Vertebral arteries: Vertebral arteries codominant and patent within the neck. Apparent severe stenosis within the proximal right V1 segment. A small portion of the right vertebral artery is excluded from the field of view at the V2/V3 junction, limiting evaluation at this site. Appreciable hemodynamically significant stenosis within the cervical left vertebral artery. IMPRESSION: MRI brain: 1. No evidence of acute intracranial abnormality. 2. Parenchymal atrophy, advanced chronic small vessel ischemic disease and multiple chronic infarcts as detailed. Notably, a small to moderate-sized chronic infarct within the left cerebellar hemisphere is new from the prior MRI of 08/15/2020. 3. Abnormal T1 hypointense marrow signal within the visualized upper cervical spine. While this finding can reflect a marrow infiltrative process, the most common causes include chronic anemia, smoking and obesity. MRA neck: 1. The common carotid and internal carotid arteries are patent within the neck without hemodynamically significant stenosis. 2. The vertebral arteries are patent within the neck. Apparent severe stenosis within the proximal right V1 segment. Please note, a small portion of the right vertebral artery is excluded from the field of view at the V2/V3 junction. Electronically Signed   By: KKellie SimmeringD.O.   On: 07/28/2022 09:35   MR Angiogram Neck W or Wo Contrast  Result Date: 07/28/2022 CLINICAL DATA:  Provided history: Dizziness, persistent/recurrent, cardiac or vascular cause suspected. EXAM: MRI HEAD WITHOUT AND WITH CONTRAST MRA NECK WITHOUT AND WITH CONTRAST TECHNIQUE: Multiplanar, multi-echo pulse sequences of the brain and surrounding structures were acquired without and with  intravenous contrast. Angiographic images of the neck were acquired using MRA technique without and with intravenous  contrast. Carotid stenosis measurements (when applicable) are obtained utilizing NASCET criteria, using the distal internal carotid diameter as the denominator. CONTRAST:  43m GADAVIST GADOBUTROL 1 MMOL/ML IV SOLN COMPARISON:  CT 07/27/2022. MRA head 08/15/2020. MRA neck 05/20/2011. FINDINGS: MRI HEAD FINDINGS Brain: Mild-to-moderate generalized parenchymal atrophy. Advanced patchy and confluent T2 FLAIR hyperintense signal abnormality within the cerebral white matter, nonspecific but compatible with chronic small vessel disease. Known chronic lacunar infarcts within the right centrum semiovale, right lentiform nucleus/internal capsule, left basal ganglia and bilateral thalami. Wallerian degeneration affecting the right cerebral peduncle. Mild chronic small vessel image changes within the pons. Redemonstrated chronic infarct within the left middle cerebellar peduncle. Small to moderate-sized chronic infarct within the left cerebellar hemisphere, new from the prior MRI. There is no acute infarct. No chronic intracranial blood products. No extra-axial fluid collection. No midline shift. No pathologic intracranial enhancement identified. Vascular: Maintained flow voids within the proximal large arterial vessels. Skull and upper cervical spine: No focal suspicious marrow lesion. Abnormal T1 hypointense marrow signal within the visualized upper cervical spine. Sinuses/Orbits: No mass or acute finding within the imaged orbits. Prior bilateral ocular lens replacement. Small mucous retention cyst within the left sphenoid sinus. Other: Trace fluid within the left mastoid air cells. MRA NECK FINDINGS Aortic arch: Standard aortic branching. The visualized aortic arch is normal in caliber. Right carotid system: CCA and ICA patent within the neck without hemodynamically significant stenosis (50% or greater). Left carotid system: CCA and ICA patent within the neck without hemodynamically significant stenosis (50% or greater).  Vertebral arteries: Vertebral arteries codominant and patent within the neck. Apparent severe stenosis within the proximal right V1 segment. A small portion of the right vertebral artery is excluded from the field of view at the V2/V3 junction, limiting evaluation at this site. Appreciable hemodynamically significant stenosis within the cervical left vertebral artery. IMPRESSION: MRI brain: 1. No evidence of acute intracranial abnormality. 2. Parenchymal atrophy, advanced chronic small vessel ischemic disease and multiple chronic infarcts as detailed. Notably, a small to moderate-sized chronic infarct within the left cerebellar hemisphere is new from the prior MRI of 08/15/2020. 3. Abnormal T1 hypointense marrow signal within the visualized upper cervical spine. While this finding can reflect a marrow infiltrative process, the most common causes include chronic anemia, smoking and obesity. MRA neck: 1. The common carotid and internal carotid arteries are patent within the neck without hemodynamically significant stenosis. 2. The vertebral arteries are patent within the neck. Apparent severe stenosis within the proximal right V1 segment. Please note, a small portion of the right vertebral artery is excluded from the field of view at the V2/V3 junction. Electronically Signed   By: KKellie SimmeringD.O.   On: 07/28/2022 09:35   CT Head Wo Contrast  Result Date: 07/27/2022 CLINICAL DATA:  Patient complains of dizziness.  Vomiting. EXAM: CT HEAD WITHOUT CONTRAST TECHNIQUE: Contiguous axial images were obtained from the base of the skull through the vertex without intravenous contrast. RADIATION DOSE REDUCTION: This exam was performed according to the departmental dose-optimization program which includes automated exposure control, adjustment of the mA and/or kV according to patient size and/or use of iterative reconstruction technique. COMPARISON:  08/15/2020 FINDINGS: Brain: No evidence of acute infarction, hemorrhage,  hydrocephalus, extra-axial collection or mass lesion/mass effect. Focal area of low-density within the right basal ganglia compatible with remote lacunar infarct. There also small low-attenuation foci within bilateral  thalami concerning for chronic lacunar infarcts. There is mild diffuse low-attenuation within the subcortical and periventricular white matter compatible with chronic microvascular disease. Prominence of the sulci and ventricles compatible with brain atrophy. Vascular: No hyperdense vessel or unexpected calcification. Skull: Normal. Negative for fracture or focal lesion. Sinuses/Orbits: No acute finding. Other: None. IMPRESSION: 1. No acute intracranial abnormalities. 2. Chronic small vessel ischemic disease and brain atrophy. 3. Remote lacunar infarcts within the right basal ganglia and bilateral thalami. Electronically Signed   By: Kerby Moors M.D.   On: 07/27/2022 18:32    Scheduled Meds:  apixaban  5 mg Oral BID   arformoterol  15 mcg Nebulization BID   And   umeclidinium bromide  1 puff Inhalation Daily   atorvastatin  80 mg Oral QPM   cyanocobalamin  1,000 mcg Oral Daily   insulin aspart  0-5 Units Subcutaneous QHS   insulin aspart  0-9 Units Subcutaneous TID WC   isosorbide mononitrate  30 mg Oral QPM   metoprolol tartrate  25 mg Oral BID   tamsulosin  0.4 mg Oral QPM   Continuous Infusions:   LOS: 0 days   Time spent: 35 mins  Saki Legore Wynetta Emery, MD How to contact the [facility] Attending or Consulting provider [place] or covering provider during after hours [place], for this patient?  Check the care team in [facility] and look for a) attending/consulting [facility] provider listed and b) the [facility] team listed Log into [email or web address] and use [place]'s universal password to access. If you do not have the password, please contact the hospital operator. Locate the [facility] provider you are looking for under [facility] and page to a number that you can be directly reached. If you still  have difficulty reaching the provider, please page the [facility] (Director on Call) for the Hospitalists listed on amion for assistance.  07/28/2022, 2:11 PM

## 2022-07-28 NOTE — Progress Notes (Signed)
Spoke with patient's wife, [name], she is agreeable to HHPT. Discussed [place] providers. Referral made and accepted by Marjory Lies with [facility].

## 2022-07-28 NOTE — Consult Note (Signed)
I connected with  [name] on 07/28/22 by a video enabled telemedicine application and verified that I am speaking with the correct person using two identifiers.   I discussed the limitations of evaluation and management by telemedicine. The patient expressed understanding and agreed to proceed.  Location of patient: [facility] Location of physician: [facility]  Neurology Consultation Reason for Consult: Dizziness Referring Physician: Dr Nicanor Bake  CC: Dizziness  History is obtained from: Patient, chart review  HPI: [name] is a 81 y.o. 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  States he was getting out of [facility] and while driving suddenly felt off balance and leaning towards the right side.  He went home and had about 4 episodes of vomiting along with dizziness and therefore eventually came to the emergency room.  Last night, had 2 more episodes of vomiting with dizziness.  Reports still having dizziness "on and off" but denies any provoking factors.  Denies any change in dizziness due to position, keeping eyes open or closed.  Denies any vision changes, focal weakness, tingling, numbness.  Denies any recent upper respiratory infection, hearing loss.  Per wife his speech seems "dragging" but denies any aphasia.  Reports taking Eliquis 5 mg twice daily and being compliant with it.  Last known normal: 07/27/2022 at 9am No tPA as outside window No thrombectomy as no large vessel occlusion mRS: 4  ROS: All other systems reviewed and negative except as noted in the HPI.   Past Medical History:  Diagnosis Date   Anemia    C. difficile diarrhea    CKD (chronic kidney disease), stage III ([place])    Coronary artery disease    Status post CABG  in 2007 [facility])   Diabetes mellitus with stage 1 chronic kidney disease ([place]) 05/20/2011   GERD (gastroesophageal reflux disease)    Glaucoma    Hyperlipidemia    Hypertension    Morbid obesity ([facility])    PAF (paroxysmal atrial fibrillation) ([place])    Peripheral vascular disease ([place])    Sleep apnea    Stroke ([place]) 2008, 2014, 2015    Family History  Problem Relation Age of Onset   Diabetes Mother    Heart failure Mother    Hypertension Mother    Diabetes Father    Heart failure Father    Hypertension Father    Diabetes Sister    Heart failure Sister    Hypertension Sister    Hyperlipidemia Sister    Diabetes Brother    Heart failure Brother    Hypertension Brother     Social History:  reports that he quit smoking about 40 years ago. His smoking use included cigarettes. He has a 20.00 pack-year smoking history. He has never used smokeless tobacco. He reports that he does not drink alcohol and does not use drugs.   Medications Prior to Admission  Medication Sig Dispense Refill Last Dose   acetaminophen (TYLENOL) 650 MG CR tablet Take 1,300 mg by mouth every 8 (eight) hours as needed for pain.   unknown   albuterol (VENTOLIN HFA) 108 (90 Base) MCG/ACT inhaler Inhale 1-2 puffs into the lungs every 6 (six) hours as needed for shortness of breath.   07/27/2022   apixaban (ELIQUIS) 5 MG TABS tablet Take 1  tablet (5 mg total) by mouth 2 (two) times daily. 14 tablet 0 07/27/2022 at 0700   atorvastatin (LIPITOR) 80 MG tablet Take 80 mg by mouth every evening.    07/26/2022   carboxymethylcellulose (REFRESH PLUS) 0.5 % SOLN Apply to eye.   unknown   Cyanocobalamin (VITAMIN B 12) 500 MCG TABS Take 1,000 mcg by mouth daily. 180 tablet 1 07/27/2022   glucose-Vitamin C 4-0.006 GM CHEW chewable tablet Chew 4 tablets by mouth as directed. CHEW FOUR TABLETS BY MOUTH AS DIRECTED BY YOUR PROVIDER (REPEAT EVERY 15 MINUTES IF BLOOD SUGAR LESS THAN 70)   unknown   insulin aspart  protamine- aspart (NOVOLOG MIX 70/30) (70-30) 100 UNIT/ML injection 42 units in the morning and 50 units at night. (Patient taking differently: Inject 40 Units into the skin 2 (two) times daily with a meal.) 10 mL 0 07/27/2022   isosorbide mononitrate (IMDUR) 30 MG 24 hr tablet Take 1 tablet (30 mg total) by mouth every evening. 90 tablet 3 07/26/2022   metoprolol tartrate (LOPRESSOR) 50 MG tablet Take 25 mg by mouth 2 (two) times daily.   07/27/2022 at 0700   mycophenolate (CELLCEPT) 500 MG tablet Take 2 tablets by mouth 2 (two) times daily.   07/27/2022   pioglitazone (ACTOS) 30 MG tablet Take 30 mg by mouth daily.   07/27/2022   Semaglutide (OZEMPIC, 0.25 OR 0.5 MG/DOSE, [place]) Inject 0.25 mg into the skin once a week.   07/23/2022   spironolactone (ALDACTONE) 25 MG tablet Take 0.5 tablets (12.5 mg total) by mouth daily. 45 tablet 1 07/27/2022   tamsulosin (FLOMAX) 0.4 MG CAPS capsule Take 0.4 mg by mouth every evening.   07/26/2022   Tiotropium Bromide-Olodaterol 2.5-2.5 MCG/ACT AERS Inhale 2 puffs into the lungs daily.   unknown   torsemide (DEMADEX) 20 MG tablet Take 40 mg by mouth daily.   07/27/2022   Vitamin D, Ergocalciferol, (DRISDOL) 1.25 MG (50000 UNIT) CAPS capsule Take 50,000 Units by mouth once a week.   07/25/2022   amLODipine (NORVASC) 10 MG tablet Take 10 mg by mouth daily. (Patient not taking: Reported on 07/27/2022)   Not Taking      Exam: Current vital signs: BP (!) 139/96   Pulse 66   Temp 98.6 F (37 C) (Oral)   Resp 16   Ht '5\' 8"'$  (1.727 m)   Wt (!) 162.2 kg   SpO2 99%   BMI 54.37 kg/m  Vital signs in last 24 hours: Temp:  [97.5 F (36.4 C)-98.6 F (37 C)] 98.6 F (37 C) (09/27 0438) Pulse Rate:  [66-87] 66 (09/27 0950) Resp:  [13-24] 16 (09/27 0737) BP: (131-157)/(40-96) 139/96 (09/27 0950) SpO2:  [95 %-100 %] 99 % (09/27 0737) FiO2 (%):  [21 %] 21 % (09/26 2141) Weight:  [119.7 kg-162.2 kg] 162.2 kg (09/27 4401)   Physical Exam  Constitutional: Appears  well-developed and well-nourished.  Psych: Affect appropriate to situation Eyes: No scleral injection Neuro: AOx3, cranial nerves II to XII grossly intact, antigravity strength in all 4 extremities without drift, sensory intact to light touch, FTN intact bilaterally  NIHSS 0  I have reviewed labs in epic and the results pertinent to this consultation are: CBC:  Recent Labs  Lab 07/27/22 1601 07/28/22 0431  WBC 7.8 7.2  HGB 10.4* 9.4*  HCT 33.6* 29.9*  MCV 97.1 95.5  PLT 228 027    Basic Metabolic Panel:  Lab Results  Component Value Date   NA 138 07/28/2022  K 3.9 07/28/2022   CO2 23 07/28/2022   GLUCOSE 141 (H) 07/28/2022   BUN 37 (H) 07/28/2022   CREATININE 1.91 (H) 07/28/2022   CALCIUM 8.8 (L) 07/28/2022   GFRNONAA 35 (L) 07/28/2022   GFRAA 46 (L) 07/15/2020   Lipid Panel:  Lab Results  Component Value Date   LDLCALC 76 08/16/2020   HgbA1c:  Lab Results  Component Value Date   HGBA1C 7.8 (H) 05/13/2022   Urine Drug Screen:     Component Value Date/Time   LABOPIA NONE DETECTED 11/20/2013 1304   COCAINSCRNUR NONE DETECTED 11/20/2013 1304   LABBENZ NONE DETECTED 11/20/2013 1304   AMPHETMU NONE DETECTED 11/20/2013 1304   THCU NONE DETECTED 11/20/2013 1304   LABBARB NONE DETECTED 11/20/2013 1304    Alcohol Level     Component Value Date/Time   ETH <11 11/20/2013 1142     I have reviewed the images obtained: CT head without contrast 07/27/2022: 1. No acute intracranial abnormalities. 2. Chronic small vessel ischemic disease and brain atrophy. 3. Remote lacunar infarcts within the right basal ganglia and bilateral thalami.  MRI brain with and without contrast 07/28/2022:  1. No evidence of acute intracranial abnormality. 2. Parenchymal atrophy, advanced chronic small vessel ischemic disease and multiple chronic infarcts as detailed. Notably, a small to moderate-sized chronic infarct within the left cerebellar hemisphere is new from the prior MRI of  08/15/2020. 3. Abnormal T1 hypointense marrow signal within the visualized upper cervical spine. While this finding can reflect a marrow infiltrative process, the most common causes include chronic anemia, smoking and obesity.  MR angio head and neck with and without contrast 07/28/2022:  1. The common carotid and internal carotid arteries are patent within the neck without hemodynamically significant stenosis. 2. The vertebral arteries are patent within the neck. Apparent severe stenosis within the proximal right V1 segment. Please note, a small portion of the right vertebral artery is excluded from the field of view at the V2/V3 junction.  US carotid 07/28/2022:  1. Mild (1-49%) stenosis proximal right internal carotid artery secondary to heterogenous atherosclerotic plaque. 2. Mild (1-49%) stenosis proximal left internal carotid artery secondary to heterogenous atherosclerotic plaque. 3. Vertebral arteries are patent with normal antegrade flow.    ASSESSMENT/PLAN: 81 year old male with history of A-fib on Eliquis, multiple chronic strokes with residual left lower extremity weakness who presented with sudden onset dizziness and vomiting.  Dizziness - MRI brain did not show any acute ischemic stroke.  Differentials include MRI negative posterior circulation stroke versus BPPV  Recommendations: -Discussed with Dr. Leonie Man.  Okay to resume Eliquis at home dose, continue atorvastatin 80 mg daily -Can start Valium 5 mg twice daily with taper over the next few days for symptomatic treatment of dizziness and vomiting -Recommend vestibular rehab -Discussed stroke risk factor modification, stroke education -Goal blood pressure: Normotension -Discussed plan with patient, wife at bedside and Dr. Wynetta Emery via secure chat   Thank you for allowing [place] to participate in the care of this patient. If you have any further questions, please contact  me or neurohospitalist.   [facility]  neurohospitalist

## 2022-07-29 ENCOUNTER — Encounter (HOSPITAL_COMMUNITY): Payer: No Typology Code available for payment source

## 2022-07-29 DIAGNOSIS — I10 Essential (primary) hypertension: Secondary | ICD-10-CM | POA: Diagnosis not present

## 2022-07-29 DIAGNOSIS — E1122 Type 2 diabetes mellitus with diabetic chronic kidney disease: Secondary | ICD-10-CM | POA: Diagnosis not present

## 2022-07-29 DIAGNOSIS — R42 Dizziness and giddiness: Secondary | ICD-10-CM | POA: Diagnosis not present

## 2022-07-29 DIAGNOSIS — N400 Enlarged prostate without lower urinary tract symptoms: Secondary | ICD-10-CM | POA: Diagnosis not present

## 2022-07-29 LAB — GLUCOSE, CAPILLARY: Glucose-Capillary: 167 mg/dL — ABNORMAL HIGH (ref 70–99)

## 2022-07-29 MED ORDER — AMLODIPINE BESYLATE 10 MG PO TABS: 10.0000 mg | ORAL_TABLET | Freq: Every day | ORAL | 1 refills | Status: DC

## 2022-07-29 MED ORDER — INSULIN ASPART PROT & ASPART (70-30 MIX) 100 UNIT/ML ~~LOC~~ SUSP: 40.0000 [IU] | Freq: Two times a day (BID) | SUBCUTANEOUS | Status: AC

## 2022-07-29 MED ORDER — DIAZEPAM 5 MG PO TABS: 2.5000 mg | ORAL_TABLET | Freq: Two times a day (BID) | ORAL | 0 refills | Status: AC | PRN

## 2022-07-29 NOTE — Discharge Instructions (Signed)

## 2022-07-29 NOTE — Progress Notes (Signed)
Patient slept on and off this shift. No complaints of pain and awoken when staff checked on patient.

## 2022-07-29 NOTE — Discharge Summary (Addendum)
Physician Discharge Summary  [name] SEG:[id number] DOB: 19-Mar-1941 DOA: 07/27/2022  PCP: Monico Blitz, MD  Admit date: 07/27/2022 Discharge date: 07/29/2022  Admitted From:Home  Disposition: Home with [place]   Recommendations for Outpatient Follow-up:  Follow up with PCP in 1 weeks Follow up with your [place] Neurologist in 2 weeks  Follow up with cardiology as scheduled Ambulatory referral to PT for vestibular rehab made   Home Health: PT/OT   Discharge Condition: STABLE   CODE STATUS: FULL DIET: low sodium heart, carb modified   Brief Hospitalization Summary: Please see all hospital notes, images, labs for full details of the hospitalization. ADMISSION HPI:  81 y.o. 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  Patient went to see his pulmonologist at the [facility] this morning around 9-10 AM, on trying to enter his car after leaving pulmonologist office, he felt dizzy which was described as " unbalance and moving sideways" despite sitting in 1 place.  He has had about 4-5 episodes of the dizziness and this is usually associated with a projectile nonbloody vomiting which subsequently resulted in symptom subsiding.  Changing of position does not affect the symptoms.  Patient denies fever, chills, chest pain, shortness of breath, diarrhea or constipation. ED Course:  In the emergency department, BP was 142/64, but other vital signs were within normal range.  Work-up in the ED showed normocytic anemia, normal BMP except for glucose of 110 and BUN/creatinine 44/2.28 (baseline creatinine at 2.1-2.3).  Troponin x2 was negative CT head without contrast showed no acute intracranial abnormalities IV Reglan was given, IV hydration  was provided.  Teleneurologist was consulted on recommended CTA head and neck and MRI of brain without contrast per ED PA.  Hospitalist was asked to admit patient for further evaluation and management  HOSPITAL COURSE BY PROBLEM   Dizziness CT head without contrast showed no acute intracranial abnormalities Continue fall precaution and neurochecks Continue meclizine Unfortunately, patient was allergic to contrast and will not be able to do CT angiography of head and neck Bilateral ultrasound of the carotid arteries will be done in the morning MRI of brain without contrast completed  PT/OT eval and treat and home health recommended and ordered Neurology was consulted for further recommendations. Neuro felt posterior stroke vs BPPV.   Neuro prescribed diazepam taper prn dizzy spells, vestibular rehab, outpatient follow up, please see full neurology consult note from Dr. Hortense Ramal.  Pt says he will follow up with his neurologist at the [place].  Pt strongly advised to follow up with PCP.    Nausea and vomiting Resolved now.     COPD (not in acute exacerbation) Continue Proventil, Brovana   Chronic kidney disease stage IV BUN/creatinine 44/2.28 (baseline creatinine at 2.1-2.3) Renally adjust medications, avoid nephrotoxic agents/dehydration/hypotension   Essential hypertension Continue Lopressor and Imdur, amlodipine    Mixed hyperlipidemia Continue Lipitor   Type 2 diabetes mellitus Resume home treatment plan and encouraged frequent CBG monitoring and hypoglycemia precautions CBG (last 3)  Recent Labs    07/28/22 1624 07/28/22 2136 07/29/22 0723  GLUCAP 221* 140* 167*    BPH Continue Flomax   Morbid obesity (BMI 40.51) Diet and lifestyle modification   Prior CVA Continue statin Apixaban can be restarted per neurologist    Discharge Diagnoses:  Principal Problem:   Dizziness Active Problems:   Essential hypertension   DM (diabetes mellitus) type II controlled with renal  manifestation (HCC)   Mixed hyperlipidemia   Obesity, Class III, BMI 40-49.9 (morbid obesity) ([place])   History of stroke   CKD (chronic kidney disease), stage IV (HCC)   BPH (benign prostatic hyperplasia)   Nausea & vomiting   Discharge Instructions: Discharge Instructions     Ambulatory referral to Physical Therapy   Complete by: As directed    VESTIBULAR REHAB      Allergies as of 07/29/2022       Reactions   Ivp Dye [iodinated Contrast Media] Rash        Medication List     TAKE these medications    acetaminophen 650 MG CR tablet Commonly known as: TYLENOL Take 1,300 mg by mouth every 8 (eight) hours as needed for pain.   albuterol 108 (90 Base) MCG/ACT inhaler Commonly known as: VENTOLIN HFA Inhale 1-2 puffs into the lungs every 6 (six) hours as needed for shortness of breath.   amLODipine 10 MG tablet Commonly known as: NORVASC Take 1 tablet (10 mg total) by mouth daily.   apixaban 5 MG Tabs tablet Commonly known as: ELIQUIS Take 1 tablet (5 mg total) by mouth 2 (two) times daily.   atorvastatin 80 MG tablet Commonly known as: LIPITOR Take 40 mg by mouth every evening.   carboxymethylcellulose 0.5 % Soln Commonly known as: REFRESH PLUS Apply to eye.   diazepam 5 MG tablet Commonly known as: VALIUM Take 0.5 tablets (2.5 mg total) by mouth every 12 (twelve) hours as needed for up to 5 days (dizziness).   glucose-Vitamin C 4-0.006 GM Chew chewable tablet Chew 4 tablets by mouth as directed. CHEW FOUR TABLETS BY MOUTH AS DIRECTED BY YOUR PROVIDER (REPEAT EVERY 15 MINUTES IF BLOOD SUGAR LESS THAN 70)   insulin aspart protamine- aspart (70-30) 100 UNIT/ML injection Commonly known as: NOVOLOG MIX 70/30 Inject 0.4 mLs (40 Units total) into the skin 2 (two) times daily with a meal.   isosorbide mononitrate 30 MG 24 hr tablet Commonly known as: IMDUR Take 1 tablet (30 mg total) by mouth every evening.   metoprolol tartrate 50 MG tablet Commonly known  as: LOPRESSOR Take 25 mg by mouth 2 (two) times daily.   mycophenolate 500 MG tablet Commonly known as: CELLCEPT Take 2 tablets by mouth 2 (two) times daily.   OZEMPIC (0.25 OR 0.5 MG/DOSE) [place] Inject 0.25 mg into the skin once a week.   pioglitazone 30 MG tablet Commonly known as: ACTOS Take 30 mg by mouth daily.   spironolactone 25 MG tablet Commonly known as: ALDACTONE Take 0.5 tablets (12.5 mg total) by mouth daily.   tamsulosin 0.4 MG Caps capsule Commonly known as: FLOMAX Take 0.4 mg by mouth every evening.   Tiotropium Bromide-Olodaterol 2.5-2.5 MCG/ACT Aers Inhale 2 puffs into the lungs daily.   torsemide 20 MG tablet Commonly known as: DEMADEX Take 20 mg by mouth daily.   Vitamin B 12 500 MCG Tabs Take 1,000 mcg by mouth daily.   Vitamin D (Ergocalciferol) 1.25 MG (50000 UNIT) Caps capsule Commonly known as: DRISDOL Take 50,000 Units by mouth once a week.        Follow-up Information     Monico Blitz, MD Follow up in 1 week(s).   Specialty: Internal Medicine Why: Hospital Follow Up Contact information: [address] [place] [address] [phone]  Satira Sark, MD .   Specialty: Cardiology Contact information: [place] [address] [phone]         [place] [facility]rologist. Schedule an appointment as soon as possible for a visit in 2 week(s).   Why: Hospital Follow Up               Allergies  Allergen Reactions   Ivp Dye [Iodinated Contrast Media] Rash   Allergies as of 07/29/2022       Reactions   Ivp Dye [iodinated Contrast Media] Rash        Medication List     TAKE these medications    acetaminophen 650 MG CR tablet Commonly known as: TYLENOL Take 1,300 mg by mouth every 8 (eight) hours as needed for pain.   albuterol 108 (90 Base) MCG/ACT inhaler Commonly known as: VENTOLIN HFA Inhale 1-2 puffs into the lungs every 6 (six) hours as needed for shortness of breath.   amLODipine 10 MG  tablet Commonly known as: NORVASC Take 1 tablet (10 mg total) by mouth daily.   apixaban 5 MG Tabs tablet Commonly known as: ELIQUIS Take 1 tablet (5 mg total) by mouth 2 (two) times daily.   atorvastatin 80 MG tablet Commonly known as: LIPITOR Take 40 mg by mouth every evening.   carboxymethylcellulose 0.5 % Soln Commonly known as: REFRESH PLUS Apply to eye.   diazepam 5 MG tablet Commonly known as: VALIUM Take 0.5 tablets (2.5 mg total) by mouth every 12 (twelve) hours as needed for up to 5 days (dizziness).   glucose-Vitamin C 4-0.006 GM Chew chewable tablet Chew 4 tablets by mouth as directed. CHEW FOUR TABLETS BY MOUTH AS DIRECTED BY YOUR PROVIDER (REPEAT EVERY 15 MINUTES IF BLOOD SUGAR LESS THAN 70)   insulin aspart protamine- aspart (70-30) 100 UNIT/ML injection Commonly known as: NOVOLOG MIX 70/30 Inject 0.4 mLs (40 Units total) into the skin 2 (two) times daily with a meal.   isosorbide mononitrate 30 MG 24 hr tablet Commonly known as: IMDUR Take 1 tablet (30 mg total) by mouth every evening.   metoprolol tartrate 50 MG tablet Commonly known as: LOPRESSOR Take 25 mg by mouth 2 (two) times daily.   mycophenolate 500 MG tablet Commonly known as: CELLCEPT Take 2 tablets by mouth 2 (two) times daily.   OZEMPIC (0.25 OR 0.5 MG/DOSE) [place] Inject 0.25 mg into the skin once a week.   pioglitazone 30 MG tablet Commonly known as: ACTOS Take 30 mg by mouth daily.   spironolactone 25 MG tablet Commonly known as: ALDACTONE Take 0.5 tablets (12.5 mg total) by mouth daily.   tamsulosin 0.4 MG Caps capsule Commonly known as: FLOMAX Take 0.4 mg by mouth every evening.   Tiotropium Bromide-Olodaterol 2.5-2.5 MCG/ACT Aers Inhale 2 puffs into the lungs daily.   torsemide 20 MG tablet Commonly known as: DEMADEX Take 20 mg by mouth daily.   Vitamin B 12 500 MCG Tabs Take 1,000 mcg by mouth daily.   Vitamin D (Ergocalciferol) 1.25 MG (50000 UNIT) Caps capsule Commonly  known as: DRISDOL Take 50,000 Units by mouth once a week.        Procedures/Studies: MR ANGIO HEAD WO CONTRAST  Result Date: 07/28/2022 CLINICAL DATA:  Stroke follow up EXAM: MRA HEAD WITHOUT CONTRAST TECHNIQUE: Angiographic images of the Circle of Willis were acquired using MRA technique without intravenous contrast. COMPARISON:  None Available. FINDINGS: Anterior circulation: No evidence of high grade stenosis, aneurysm, or dissection. Posterior circulation: Focal  severe stenosis at the P2/P3 junction on the right (series 100, image 120). Left AICA is poorly visualized. Anatomic variants: None Other: None. 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 Electronically Signed   By: Marin Roberts M.D.   On: 07/28/2022 13:15   US Carotid Bilateral  Result Date: 07/28/2022 CLINICAL DATA:  Dizziness and syncope EXAM: BILATERAL CAROTID DUPLEX ULTRASOUND TECHNIQUE: Pearline Cables scale imaging, color Doppler and duplex ultrasound were performed of bilateral carotid and vertebral arteries in the neck. COMPARISON:  None Available. FINDINGS: Criteria: Quantification of carotid stenosis is based on velocity parameters that correlate the residual internal carotid diameter with NASCET-based stenosis levels, using the diameter of the distal internal carotid lumen as the denominator for stenosis measurement. The following velocity measurements were obtained: RIGHT ICA: 104/13 cm/sec CCA: 01/7 cm/sec SYSTOLIC ICA/CCA RATIO:  1.3 ECA:  89 cm/sec LEFT ICA: 89/11 cm/sec CCA: 510/25 cm/sec SYSTOLIC ICA/CCA RATIO:  0.7 ECA:  131 cm/sec RIGHT CAROTID ARTERY: Mild heterogeneous atherosclerotic plaque in the proximal internal carotid artery. By peak systolic velocity criteria, the estimated stenosis is less than 50%. RIGHT VERTEBRAL ARTERY:  Patent with normal antegrade flow. LEFT CAROTID ARTERY: Mild heterogeneous atherosclerotic plaque in the proximal internal carotid artery. By peak systolic velocity criteria, the  estimated stenosis remains less than 50%. LEFT VERTEBRAL ARTERY:  Patent with normal antegrade flow. IMPRESSION: 1. Mild (1-49%) stenosis proximal right internal carotid artery secondary to heterogenous atherosclerotic plaque. 2. Mild (1-49%) stenosis proximal left internal carotid artery secondary to heterogenous atherosclerotic plaque. 3. Vertebral arteries are patent with normal antegrade flow. Signed, Criselda Peaches, MD, [facility] Electronically Signed   By: Jacqulynn Cadet M.D.   On: 07/28/2022 11:35   MR Brain W and Wo Contrast  Result Date: 07/28/2022 CLINICAL DATA:  Provided history: Dizziness, persistent/recurrent, cardiac or vascular cause suspected. EXAM: MRI HEAD WITHOUT AND WITH CONTRAST MRA NECK WITHOUT AND WITH CONTRAST TECHNIQUE: Multiplanar, multi-echo pulse sequences of the brain and surrounding structures were acquired without and with intravenous contrast. Angiographic images of the neck were acquired using MRA technique without and with intravenous contrast. Carotid stenosis measurements (when applicable) are obtained utilizing NASCET criteria, using the distal internal carotid diameter as the denominator. CONTRAST:  36m GADAVIST GADOBUTROL 1 MMOL/ML IV SOLN COMPARISON:  CT 07/27/2022. MRA head 08/15/2020. MRA neck 05/20/2011. FINDINGS: MRI HEAD FINDINGS Brain: Mild-to-moderate generalized parenchymal atrophy. Advanced patchy and confluent T2 FLAIR hyperintense signal abnormality within the cerebral white matter, nonspecific but compatible with chronic small vessel disease. Known chronic lacunar infarcts within the right centrum semiovale, right lentiform nucleus/internal capsule, left basal ganglia and bilateral thalami. Wallerian degeneration affecting the right cerebral peduncle. Mild chronic small vessel image changes within the pons. Redemonstrated chronic infarct within the left middle cerebellar peduncle. Small  to moderate-sized chronic infarct within the left cerebellar hemisphere, new from the prior MRI. There is no acute infarct. No chronic intracranial blood products. No extra-axial fluid collection. No midline shift. No pathologic intracranial enhancement identified. Vascular: Maintained flow voids within the proximal large arterial vessels. Skull and upper cervical spine: No focal suspicious marrow lesion. Abnormal T1 hypointense marrow signal within the visualized upper cervical spine. Sinuses/Orbits: No mass or acute finding within the imaged orbits. Prior bilateral ocular lens replacement. Small mucous retention cyst within the left sphenoid sinus. Other: Trace fluid within the left mastoid air cells. MRA NECK FINDINGS Aortic arch: Standard aortic branching. The visualized aortic arch is normal in  caliber. Right carotid system: CCA and ICA patent within the neck without hemodynamically significant stenosis (50% or greater). Left carotid system: CCA and ICA patent within the neck without hemodynamically significant stenosis (50% or greater). Vertebral arteries: Vertebral arteries codominant and patent within the neck. Apparent severe stenosis within the proximal right V1 segment. A small portion of the right vertebral artery is excluded from the field of view at the V2/V3 junction, limiting evaluation at this site. Appreciable hemodynamically significant stenosis within the cervical left vertebral artery. IMPRESSION: MRI brain: 1. No evidence of acute intracranial abnormality. 2. Parenchymal atrophy, advanced chronic small vessel ischemic disease and multiple chronic infarcts as detailed. Notably, a small to moderate-sized chronic infarct within the left cerebellar hemisphere is new from the prior MRI of 08/15/2020. 3. Abnormal T1 hypointense marrow signal within the visualized upper cervical spine. While this finding can reflect a marrow infiltrative process, the most common causes include chronic anemia, smoking  and obesity. MRA neck: 1. The common carotid and internal carotid arteries are patent within the neck without hemodynamically significant stenosis. 2. The vertebral arteries are patent within the neck. Apparent severe stenosis within the proximal right V1 segment. Please note, a small portion of the right vertebral artery is excluded from the field of view at the V2/V3 junction. Electronically Signed   By: Kellie Simmering D.O.   On: 07/28/2022 09:35   MR Angiogram Neck W or Wo Contrast  Result Date: 07/28/2022 CLINICAL DATA:  Provided history: Dizziness, persistent/recurrent, cardiac or vascular cause suspected. EXAM: MRI HEAD WITHOUT AND WITH CONTRAST MRA NECK WITHOUT AND WITH CONTRAST TECHNIQUE: Multiplanar, multi-echo pulse sequences of the brain and surrounding structures were acquired without and with intravenous contrast. Angiographic images of the neck were acquired using MRA technique without and with intravenous contrast. Carotid stenosis measurements (when applicable) are obtained utilizing NASCET criteria, using the distal internal carotid diameter as the denominator. CONTRAST:  59m GADAVIST GADOBUTROL 1 MMOL/ML IV SOLN COMPARISON:  CT 07/27/2022. MRA head 08/15/2020. MRA neck 05/20/2011. FINDINGS: MRI HEAD FINDINGS Brain: Mild-to-moderate generalized parenchymal atrophy. Advanced patchy and confluent T2 FLAIR hyperintense signal abnormality within the cerebral white matter, nonspecific but compatible with chronic small vessel disease. Known chronic lacunar infarcts within the right centrum semiovale, right lentiform nucleus/internal capsule, left basal ganglia and bilateral thalami. Wallerian degeneration affecting the right cerebral peduncle. Mild chronic small vessel image changes within the pons. Redemonstrated chronic infarct within the left middle cerebellar peduncle. Small to moderate-sized chronic infarct within the left cerebellar hemisphere, new from the prior MRI. There is no acute infarct.  No chronic intracranial blood products. No extra-axial fluid collection. No midline shift. No pathologic intracranial enhancement identified. Vascular: Maintained flow voids within the proximal large arterial vessels. Skull and upper cervical spine: No focal suspicious marrow lesion. Abnormal T1 hypointense marrow signal within the visualized upper cervical spine. Sinuses/Orbits: No mass or acute finding within the imaged orbits. Prior bilateral ocular lens replacement. Small mucous retention cyst within the left sphenoid sinus. Other: Trace fluid within the left mastoid air cells. MRA NECK FINDINGS Aortic arch: Standard aortic branching. The visualized aortic arch is normal in caliber. Right carotid system: CCA and ICA patent within the neck without hemodynamically significant stenosis (50% or greater). Left carotid system: CCA and ICA patent within the neck without hemodynamically significant stenosis (50% or greater). Vertebral arteries: Vertebral arteries codominant and patent within the neck. Apparent severe stenosis within the proximal right V1 segment. A small portion of the right vertebral artery  is excluded from the field of view at the V2/V3 junction, limiting evaluation at this site. Appreciable hemodynamically significant stenosis within the cervical left vertebral artery. IMPRESSION: MRI brain: 1. No evidence of acute intracranial abnormality. 2. Parenchymal atrophy, advanced chronic small vessel ischemic disease and multiple chronic infarcts as detailed. Notably, a small to moderate-sized chronic infarct within the left cerebellar hemisphere is new from the prior MRI of 08/15/2020. 3. Abnormal T1 hypointense marrow signal within the visualized upper cervical spine. While this finding can reflect a marrow infiltrative process, the most common causes include chronic anemia, smoking and obesity. MRA neck: 1. The common carotid and internal carotid arteries are patent within the neck without  hemodynamically significant stenosis. 2. The vertebral arteries are patent within the neck. Apparent severe stenosis within the proximal right V1 segment. Please note, a small portion of the right vertebral artery is excluded from the field of view at the V2/V3 junction. Electronically Signed   By: Kellie Simmering D.O.   On: 07/28/2022 09:35   CT Head Wo Contrast  Result Date: 07/27/2022 CLINICAL DATA:  Patient complains of dizziness.  Vomiting. EXAM: CT HEAD WITHOUT CONTRAST TECHNIQUE: Contiguous axial images were obtained from the base of the skull through the vertex without intravenous contrast. RADIATION DOSE REDUCTION: This exam was performed according to the departmental dose-optimization program which includes automated exposure control, adjustment of the mA and/or kV according to patient size and/or use of iterative reconstruction technique. COMPARISON:  08/15/2020 FINDINGS: Brain: No evidence of acute infarction, hemorrhage, hydrocephalus, extra-axial collection or mass lesion/mass effect. Focal area of low-density within the right basal ganglia compatible with remote lacunar infarct. There also small low-attenuation foci within bilateral thalami concerning for chronic lacunar infarcts. There is mild diffuse low-attenuation within the subcortical and periventricular white matter compatible with chronic microvascular disease. Prominence of the sulci and ventricles compatible with brain atrophy. Vascular: No hyperdense vessel or unexpected calcification. Skull: Normal. Negative for fracture or focal lesion. Sinuses/Orbits: No acute finding. Other: None. IMPRESSION: 1. No acute intracranial abnormalities. 2. Chronic small vessel ischemic disease and brain atrophy. 3. Remote lacunar infarcts within the right basal ganglia and bilateral thalami. Electronically Signed   By: Kerby Moors M.D.   On: 07/27/2022 18:32   DG ABD ACUTE 2+V W 1V CHEST  Result Date: 07/10/2022 CLINICAL DATA:  Constipation and rectal  pain. EXAM: DG ABDOMEN ACUTE WITH 1 VIEW CHEST COMPARISON:  Mar 25, 2022 FINDINGS: There is no evidence of dilated bowel loops or free intraperitoneal air. A mild stool burden is seen. No radiopaque calculi or other significant radiographic abnormality is seen. Radiopaque surgical clips are seen within the mid and upper right abdomen. Multiple sternal wires and vascular clips are seen. Heart size and mediastinal contours are within normal limits. Both lungs are clear. IMPRESSION: Negative abdominal radiographs.  No acute cardiopulmonary disease. Electronically Signed   By: Virgina Norfolk M.D.   On: 07/10/2022 19:13     Subjective: Pt reports that diazepam was very helpful in relieving his dizziness.  He rested well. He is ready to go home today.   Discharge Exam: Vitals:   07/28/22 2134 07/29/22 0838  BP: 114/61   Pulse: 73 68  Resp: 20 16  Temp: 98.5 F (36.9 C)   SpO2: 97% 98%   Vitals:   07/28/22 1343 07/28/22 2038 07/28/22 2134 07/29/22 0838  BP: (!) 146/70  114/61   Pulse: 70  73 68  Resp: '16  20 16  '$ Temp: 98.5  F (36.9 C)  98.5 F (36.9 C)   TempSrc: Oral  Oral   SpO2: 100% 97% 97% 98%  Weight:      Height:       General: Pt is alert, awake, not in acute distress Cardiovascular: RRR, S1/S2 +, no rubs, no gallops Respiratory: CTA bilaterally, no wheezing, no rhonchi Abdominal: Soft, NT, ND, bowel sounds + Extremities: no edema, no cyanosis   The results of significant diagnostics from this hospitalization (including imaging, microbiology, ancillary and laboratory) are listed below for reference.     Microbiology: No results found for this or any previous visit (from the past 240 hour(s)).   Labs: BNP (last 3 results) Recent Labs    07/10/22 1820  BNP 14.4   Basic Metabolic Panel: Recent Labs  Lab 07/27/22 1601 07/28/22 0431  NA 139 138  K 4.8 3.9  CL 104 108  CO2 25 23  GLUCOSE 110* 141*  BUN 44* 37*  CREATININE 2.28* 1.91*  CALCIUM 9.6 8.8*  MG  --   2.1  PHOS  --  3.3   Liver Function Tests: Recent Labs  Lab 07/28/22 0431  AST 20  ALT 18  ALKPHOS 52  BILITOT 0.9  PROT 7.2  ALBUMIN 3.5   No results for input(s): "LIPASE", "AMYLASE" in the last 168 hours. No results for input(s): "AMMONIA" in the last 168 hours. CBC: Recent Labs  Lab 07/27/22 1601 07/28/22 0431  WBC 7.8 7.2  HGB 10.4* 9.4*  HCT 33.6* 29.9*  MCV 97.1 95.5  PLT 228 215   Cardiac Enzymes: No results for input(s): "CKTOTAL", "CKMB", "CKMBINDEX", "TROPONINI" in the last 168 hours. BNP: Invalid input(s): "POCBNP" CBG: Recent Labs  Lab 07/28/22 0711 07/28/22 1118 07/28/22 1624 07/28/22 2136 07/29/22 0723  GLUCAP 164* 207* 221* 140* 167*   D-Dimer No results for input(s): "DDIMER" in the last 72 hours. Hgb A1c No results for input(s): "HGBA1C" in the last 72 hours. Lipid Profile No results for input(s): "CHOL", "HDL", "LDLCALC", "TRIG", "CHOLHDL", "LDLDIRECT" in the last 72 hours. Thyroid function studies No results for input(s): "TSH", "T4TOTAL", "T3FREE", "THYROIDAB" in the last 72 hours.  Invalid input(s): "FREET3" Anemia work up No results for input(s): "VITAMINB12", "FOLATE", "FERRITIN", "TIBC", "IRON", "RETICCTPCT" in the last 72 hours. Urinalysis    Component Value Date/Time   COLORURINE YELLOW 07/27/2022 1647   APPEARANCEUR CLEAR 07/27/2022 1647   LABSPEC 1.015 07/27/2022 1647   PHURINE 5.0 07/27/2022 1647   GLUCOSEU NEGATIVE 07/27/2022 1647   HGBUR NEGATIVE 07/27/2022 1647   BILIRUBINUR NEGATIVE 07/27/2022 1647   KETONESUR NEGATIVE 07/27/2022 1647   PROTEINUR 30 (A) 07/27/2022 1647   UROBILINOGEN 0.2 03/28/2014 0145   NITRITE NEGATIVE 07/27/2022 1647   LEUKOCYTESUR NEGATIVE 07/27/2022 1647   Sepsis Labs Recent Labs  Lab 07/27/22 1601 07/28/22 0431  WBC 7.8 7.2   Microbiology No results found for this or any previous visit (from the past 240 hour(s)).  Time coordinating discharge: 35 mins   SIGNED:  Irwin Brakeman, MD  [facility] 07/29/2022, 9:36 AM How to contact the [facility] Attending or Consulting provider [place] or covering provider during after hours [place], for this patient?  Check the care team in [facility] and look for a) attending/consulting [facility] provider listed and b) the [facility] team listed Log into [email or web address] and use [name]'s universal password to access. If you do not have the password, please contact the hospital operator. Locate the [facility] provider you are looking for under [facility] and page to a number that you can be directly reached. If you still have difficulty reaching the provider, please page the [facility] (Director on Call) for the Hospitalists listed on amion for assistance.

## 2022-07-30 DIAGNOSIS — E1165 Type 2 diabetes mellitus with hyperglycemia: Secondary | ICD-10-CM | POA: Diagnosis not present

## 2022-07-31 DIAGNOSIS — E7800 Pure hypercholesterolemia, unspecified: Secondary | ICD-10-CM | POA: Diagnosis not present

## 2022-07-31 DIAGNOSIS — I10 Essential (primary) hypertension: Secondary | ICD-10-CM | POA: Diagnosis not present

## 2022-08-02 DIAGNOSIS — I10 Essential (primary) hypertension: Secondary | ICD-10-CM | POA: Diagnosis not present

## 2022-08-02 DIAGNOSIS — Z09 Encounter for follow-up examination after completed treatment for conditions other than malignant neoplasm: Secondary | ICD-10-CM | POA: Diagnosis not present

## 2022-08-02 DIAGNOSIS — G319 Degenerative disease of nervous system, unspecified: Secondary | ICD-10-CM | POA: Diagnosis not present

## 2022-08-02 DIAGNOSIS — R42 Dizziness and giddiness: Secondary | ICD-10-CM | POA: Diagnosis not present

## 2022-08-02 DIAGNOSIS — I739 Peripheral vascular disease, unspecified: Secondary | ICD-10-CM | POA: Diagnosis not present

## 2022-08-03 ENCOUNTER — Encounter (HOSPITAL_COMMUNITY): Payer: No Typology Code available for payment source

## 2022-08-03 ENCOUNTER — Other Ambulatory Visit (HOSPITAL_COMMUNITY)
Admission: RE | Admit: 2022-08-03 | Discharge: 2022-08-03 | Disposition: A | Discharge status: Home / Self Care | Payer: Medicare Other | Source: Ambulatory Visit | Attending: Nephrology | Admitting: Nephrology

## 2022-08-03 DIAGNOSIS — E875 Hyperkalemia: Secondary | ICD-10-CM | POA: Diagnosis not present

## 2022-08-03 DIAGNOSIS — N170 Acute kidney failure with tubular necrosis: Secondary | ICD-10-CM | POA: Diagnosis not present

## 2022-08-03 DIAGNOSIS — N189 Chronic kidney disease, unspecified: Secondary | ICD-10-CM | POA: Diagnosis not present

## 2022-08-03 DIAGNOSIS — D638 Anemia in other chronic diseases classified elsewhere: Secondary | ICD-10-CM | POA: Diagnosis not present

## 2022-08-03 DIAGNOSIS — E1122 Type 2 diabetes mellitus with diabetic chronic kidney disease: Secondary | ICD-10-CM | POA: Insufficient documentation

## 2022-08-03 LAB — CBC WITH DIFFERENTIAL/PLATELET
Abs Immature Granulocytes: 0.03 10*3/uL (ref 0.00–0.07)
Basophils Absolute: 0 10*3/uL (ref 0.0–0.1)
Basophils Relative: 0 %
Eosinophils Absolute: 0.1 10*3/uL (ref 0.0–0.5)
Eosinophils Relative: 1 %
HCT: 32.8 % — ABNORMAL LOW (ref 39.0–52.0)
Hemoglobin: 10.5 g/dL — ABNORMAL LOW (ref 13.0–17.0)
Immature Granulocytes: 1 %
Lymphocytes Relative: 27 %
Lymphs Abs: 1.7 10*3/uL (ref 0.7–4.0)
MCH: 30.5 pg (ref 26.0–34.0)
MCHC: 32 g/dL (ref 30.0–36.0)
MCV: 95.3 fL (ref 80.0–100.0)
Monocytes Absolute: 0.8 10*3/uL (ref 0.1–1.0)
Monocytes Relative: 12 %
Neutro Abs: 3.7 10*3/uL (ref 1.7–7.7)
Neutrophils Relative %: 59 %
Platelets: 254 10*3/uL (ref 150–400)
RBC: 3.44 MIL/uL — ABNORMAL LOW (ref 4.22–5.81)
RDW: 13.3 % (ref 11.5–15.5)
WBC: 6.3 10*3/uL (ref 4.0–10.5)
nRBC: 0 % (ref 0.0–0.2)

## 2022-08-03 LAB — IRON AND TIBC
Iron: 81 ug/dL (ref 45–182)
Saturation Ratios: 27 % (ref 17.9–39.5)
TIBC: 305 ug/dL (ref 250–450)
UIBC: 224 ug/dL

## 2022-08-03 LAB — RENAL FUNCTION PANEL
Albumin: 3.7 g/dL (ref 3.5–5.0)
Anion gap: 9 (ref 5–15)
BUN: 30 mg/dL — ABNORMAL HIGH (ref 8–23)
CO2: 23 mmol/L (ref 22–32)
Calcium: 8.8 mg/dL — ABNORMAL LOW (ref 8.9–10.3)
Chloride: 105 mmol/L (ref 98–111)
Creatinine, Ser: 1.94 mg/dL — ABNORMAL HIGH (ref 0.61–1.24)
GFR, Estimated: 35 mL/min — ABNORMAL LOW (ref >60)
Glucose, Bld: 133 mg/dL — ABNORMAL HIGH (ref 70–99)
Phosphorus: 2.6 mg/dL (ref 2.5–4.6)
Potassium: 3.9 mmol/L (ref 3.5–5.1)
Sodium: 137 mmol/L (ref 135–145)

## 2022-08-03 LAB — VITAMIN B12: Vitamin B-12: 1765 pg/mL — ABNORMAL HIGH (ref 180–914)

## 2022-08-03 LAB — FERRITIN: Ferritin: 89 ng/mL (ref 24–336)

## 2022-08-03 LAB — FOLATE: Folate: 7.9 ng/mL (ref >5.9)

## 2022-08-05 ENCOUNTER — Encounter (HOSPITAL_COMMUNITY): Payer: No Typology Code available for payment source

## 2022-08-10 ENCOUNTER — Encounter (HOSPITAL_COMMUNITY): Payer: No Typology Code available for payment source

## 2022-08-12 ENCOUNTER — Encounter (HOSPITAL_COMMUNITY): Payer: No Typology Code available for payment source

## 2022-08-13 DIAGNOSIS — I130 Hypertensive heart and chronic kidney disease with heart failure and stage 1 through stage 4 chronic kidney disease, or unspecified chronic kidney disease: Secondary | ICD-10-CM | POA: Diagnosis not present

## 2022-08-13 DIAGNOSIS — I69341 Monoplegia of lower limb following cerebral infarction affecting right dominant side: Secondary | ICD-10-CM | POA: Diagnosis not present

## 2022-08-13 DIAGNOSIS — I509 Heart failure, unspecified: Secondary | ICD-10-CM | POA: Diagnosis not present

## 2022-08-13 DIAGNOSIS — I69354 Hemiplegia and hemiparesis following cerebral infarction affecting left non-dominant side: Secondary | ICD-10-CM | POA: Diagnosis not present

## 2022-08-17 ENCOUNTER — Encounter (HOSPITAL_COMMUNITY): Payer: No Typology Code available for payment source

## 2022-08-19 ENCOUNTER — Encounter (HOSPITAL_COMMUNITY): Payer: No Typology Code available for payment source

## 2022-08-24 ENCOUNTER — Encounter (HOSPITAL_COMMUNITY): Payer: No Typology Code available for payment source

## 2022-08-26 ENCOUNTER — Encounter (HOSPITAL_COMMUNITY): Payer: No Typology Code available for payment source

## 2022-08-26 DIAGNOSIS — Z299 Encounter for prophylactic measures, unspecified: Secondary | ICD-10-CM | POA: Diagnosis not present

## 2022-08-26 DIAGNOSIS — N183 Chronic kidney disease, stage 3 unspecified: Secondary | ICD-10-CM | POA: Diagnosis not present

## 2022-08-26 DIAGNOSIS — U071 COVID-19: Secondary | ICD-10-CM | POA: Diagnosis not present

## 2022-08-26 DIAGNOSIS — E1122 Type 2 diabetes mellitus with diabetic chronic kidney disease: Secondary | ICD-10-CM | POA: Diagnosis not present

## 2022-08-26 DIAGNOSIS — R509 Fever, unspecified: Secondary | ICD-10-CM | POA: Diagnosis not present

## 2022-08-30 DIAGNOSIS — E1165 Type 2 diabetes mellitus with hyperglycemia: Secondary | ICD-10-CM | POA: Diagnosis not present

## 2022-09-13 DIAGNOSIS — I10 Essential (primary) hypertension: Secondary | ICD-10-CM | POA: Diagnosis not present

## 2022-09-13 DIAGNOSIS — Z0000 Encounter for general adult medical examination without abnormal findings: Secondary | ICD-10-CM | POA: Diagnosis not present

## 2022-09-13 DIAGNOSIS — K219 Gastro-esophageal reflux disease without esophagitis: Secondary | ICD-10-CM | POA: Diagnosis not present

## 2022-09-13 DIAGNOSIS — E1165 Type 2 diabetes mellitus with hyperglycemia: Secondary | ICD-10-CM | POA: Diagnosis not present

## 2022-09-13 DIAGNOSIS — Z299 Encounter for prophylactic measures, unspecified: Secondary | ICD-10-CM | POA: Diagnosis not present

## 2022-09-20 DIAGNOSIS — R600 Localized edema: Secondary | ICD-10-CM | POA: Diagnosis not present

## 2022-09-22 DIAGNOSIS — R5383 Other fatigue: Secondary | ICD-10-CM | POA: Diagnosis not present

## 2022-09-22 DIAGNOSIS — E7800 Pure hypercholesterolemia, unspecified: Secondary | ICD-10-CM | POA: Diagnosis not present

## 2022-09-22 DIAGNOSIS — Z79899 Other long term (current) drug therapy: Secondary | ICD-10-CM | POA: Diagnosis not present

## 2022-09-27 ENCOUNTER — Telehealth: Payer: Self-pay | Admitting: Cardiology

## 2022-09-27 NOTE — Telephone Encounter (Signed)
Pt c/o swelling: STAT is pt has developed SOB within 24 hours  How much weight have you gained and in what time span?  No weight gain  If swelling, where is the swelling located?  Left leg   Are you currently taking a fluid pill?  Furosemide 20 MG, patient's wife states the patient has been taking twice daily  Are you currently SOB?  No   Do you have a log of your daily weights (if so, list)?  11/27: 262 lbs  Have you gained 3 pounds in a day or 5 pounds in a week?  No   Have you traveled recently?  No

## 2022-09-28 ENCOUNTER — Other Ambulatory Visit (HOSPITAL_COMMUNITY): Payer: Self-pay | Admitting: Specialist

## 2022-09-28 DIAGNOSIS — R1312 Dysphagia, oropharyngeal phase: Secondary | ICD-10-CM

## 2022-09-28 NOTE — Telephone Encounter (Addendum)
Reports swelling in left leg for 1 month. Denies drainage or redness in left leg. Denies SOB or chest pain. Weighs daily. Today's weight 260 lbs, yesterday 262 lbs, Sunday 09/26/22 weighed 263 lbs. Reports increasing torsemide to 40 mg daily this week but swelling has not improved. Reports seeing PCP over a week ago and had a doppler on left leg and no clots were found. Reports keeping legs elevated. Denies eating salty foods. Denies increase in fluid intake. Medications reviewed.

## 2022-09-29 DIAGNOSIS — E1165 Type 2 diabetes mellitus with hyperglycemia: Secondary | ICD-10-CM | POA: Diagnosis not present

## 2022-09-29 NOTE — Telephone Encounter (Signed)
[name] informed and verbalized understanding of plan. 

## 2022-09-29 NOTE — Addendum Note (Signed)
Addended by: Merlene Laughter on: 09/29/2022 01:03 PM   Modules accepted: Orders

## 2022-10-11 ENCOUNTER — Encounter (HOSPITAL_COMMUNITY): Payer: Self-pay | Admitting: Speech Pathology

## 2022-10-11 ENCOUNTER — Ambulatory Visit (HOSPITAL_COMMUNITY): Payer: Medicare Other | Attending: Internal Medicine | Admitting: Speech Pathology

## 2022-10-11 ENCOUNTER — Ambulatory Visit (HOSPITAL_COMMUNITY)
Admission: RE | Admit: 2022-10-11 | Discharge: 2022-10-11 | Disposition: A | Discharge status: Home / Self Care | Payer: Medicare Other | Source: Ambulatory Visit | Attending: Internal Medicine | Admitting: Internal Medicine

## 2022-10-11 DIAGNOSIS — R1312 Dysphagia, oropharyngeal phase: Secondary | ICD-10-CM | POA: Diagnosis not present

## 2022-10-11 DIAGNOSIS — R1310 Dysphagia, unspecified: Secondary | ICD-10-CM | POA: Diagnosis not present

## 2022-10-11 NOTE — Therapy (Signed)
[place], [place], [address] Phone: [phone]   Fax:  [phone]  Modified Barium Swallow  Patient Details  Name: [name] MRN: [id number] Date of Birth: [birth year] No data recorded  Encounter Date: 10/11/2022   End of Session - 10/11/22 1310     Visit Number 1    Number of Visits 1    Authorization Type UHC Medicare    SLP Start Time 9485    SLP Stop Time  1230    SLP Time Calculation (min) 35 min    Activity Tolerance Patient tolerated treatment well             Past Medical History:  Diagnosis Date   Anemia    C. difficile diarrhea    CKD (chronic kidney disease), stage III ([place])    Coronary artery disease    Status post CABG in 2007 [facility])   Diabetes mellitus with stage 1 chronic kidney disease ([place]) 05/20/2011   GERD (gastroesophageal reflux disease)    Glaucoma    Hyperlipidemia    Hypertension    Morbid obesity ([place])    PAF (paroxysmal atrial fibrillation) ([place])    Peripheral vascular disease ([place])    Sleep apnea    Stroke ([place]) 2008, 2014, 2015    Past Surgical History:  Procedure Laterality Date   CHOLECYSTECTOMY     CORONARY ARTERY BYPASS GRAFT     NO PAST SURGERIES     THYROID SURGERY      There were no vitals filed for this visit.        General - 10/11/22 1303       General Information   Date of Onset 09/27/22    HPI [name] is a 81 yo male who was referred by Dr. Monico Blitz for MBSS due to wife's report of dysphagia (coughing on foods and liquids following his most recent stroke and COVID). The patient indicates that his swallowing is "fine". He has a history of several stokes and COPD.    Type of Study MBS-Modified Barium Swallow Study    Diet Prior to this Study Regular;Thin liquids    Temperature Spikes Noted No    Respiratory Status Room air    History of Recent Intubation No    Behavior/Cognition  Alert;Cooperative;Pleasant mood    Oral Cavity Assessment Other (comment)   white, patchy coating on his tongue   Oral Care Completed by SLP No    Oral Cavity - Dentition Dentures, top;Edentulous    Vision Functional for self feeding    Self-Feeding Abilities Able to feed self    Patient Positioning Upright in chair    Baseline Vocal Quality Hoarse;Normal    Volitional Cough Strong    Volitional Swallow Able to elicit    Anatomy Within functional limits    Pharyngeal Secretions Not observed secondary MBS                Oral Preparation/Oral Phase - 10/11/22 1306       Oral Preparation/Oral Phase   Oral Phase Impaired      Oral - Thin   Oral - Thin Teaspoon Other (Comment)   min buccal pooling post swallow in sulcus vs mucocele, but clears   Oral - Thin Cup Within functional limits    Oral - Thin Straw Within functional limits      Oral - Solids   Oral - Puree Within functional limits  Oral - Regular Within functional limits    Oral - Pill Within functional limits      Electrical stimulation - Oral Phase   Was Electrical Stimulation Used No              Pharyngeal Phase - 10/11/22 1308       Pharyngeal Phase   Pharyngeal Phase Within functional limits              Cricopharyngeal Phase - 10/11/22 1308       Cervical Esophageal Phase   Cervical Esophageal Phase Within functional limits                      Plan - 10/11/22 1313     Clinical Impression Statement Pt presents with normal oropharyngeal swallow in setting of upper dentures and edentulous lower. Pt with mild oral pooling (?mucocele) with first sip of tsp thin, however cleared. Pt assessed with tsp/cup/straw thin barium, puree, regular textures, and barium tablet. Pt with timely swallow trigger and hyolaryngeal excursion, no penetration/aspiration or significant residuals post swallow. Esophageal sweep was also WNL. Pt does have white, patchy coating on his tongue and he was  encouraged to clean his tongue with a toothbrush and make sure he rinses after his oral inaler medication. He may need to contact his doctor if this does not improve as it could be thrush. Wife understands. Symptoms of coughing during PO intake not replicated today. Pt does have COPD and recently had COVID, so may have an element of difficulty coordinating breathing and swallowing at times. He was encouraged to eat and drink slowly, masticate solids well, and follow up with MD in regards to white, patchy coating on tongue.             Patient will benefit from skilled therapeutic intervention in order to improve the following deficits and impairments:   Dysphagia, oropharyngeal phase     Recommendations/Treatment - 10/11/22 1308       Swallow Evaluation Recommendations   SLP Diet Recommendations Age appropriate regular;Thin    Liquid Administration via Cup;Straw    Medication Administration Whole meds with liquid    Supervision Patient able to self feed    Compensations Multiple dry swallows after each bite/sip    Postural Changes Seated upright at 90 degrees;Remain upright for at least 30 minutes after feeds/meals              Prognosis - 10/11/22 1309       Prognosis   Prognosis for Safe Diet Advancement Good      Individuals Consulted   Consulted and Agree with Results and Recommendations Patient;Family member/caregiver    Family Member Consulted Wife    Report Sent to  Referring physician             Problem List Patient Active Problem List   Diagnosis Date Noted   Dizziness 07/27/2022   Nausea & vomiting 07/27/2022   Atrial fibrillation with rapid ventricular response ([place]) 05/02/2020   Anemia of chronic disease 03/07/2020   CKD stage 3 due to type 2 diabetes mellitus ([place]) 06/12/2019   Chest pain 06/07/2018   Hypoxia    Lactic acidosis 02/24/2018   Dyspnea 02/20/2018   BPH (benign prostatic hyperplasia) 02/20/2018   DDD (degenerative disc disease),  lumbar 10/17/2017   Chronic diastolic heart failure ([place]) 10/20/2015   Peripheral edema 01/22/2015   Tinea pedis 10/21/2014   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) 11/28/2012   History of stroke 11/28/2012   OAB (overactive bladder) 11/28/2012   Essential hypertension 05/20/2011   Thank you,  Genene Churn, [place]  [place], [place] 10/11/2022, 1:19 PM  [facility] [address] [place], [place], [address] Phone: [phone]   Fax:  [phone]  Name: [name] MRN: [id number] Date of Birth: Mar 17, 1941

## 2022-11-15 DIAGNOSIS — E1122 Type 2 diabetes mellitus with diabetic chronic kidney disease: Secondary | ICD-10-CM | POA: Diagnosis not present

## 2022-11-15 DIAGNOSIS — E1165 Type 2 diabetes mellitus with hyperglycemia: Secondary | ICD-10-CM | POA: Diagnosis not present

## 2022-11-15 DIAGNOSIS — I10 Essential (primary) hypertension: Secondary | ICD-10-CM | POA: Diagnosis not present

## 2022-11-15 DIAGNOSIS — N183 Chronic kidney disease, stage 3 unspecified: Secondary | ICD-10-CM | POA: Diagnosis not present

## 2022-11-15 DIAGNOSIS — Z299 Encounter for prophylactic measures, unspecified: Secondary | ICD-10-CM | POA: Diagnosis not present

## 2022-12-01 ENCOUNTER — Other Ambulatory Visit (HOSPITAL_COMMUNITY)
Admission: RE | Admit: 2022-12-01 | Discharge: 2022-12-01 | Disposition: A | Discharge status: Home / Self Care | Payer: Medicare PPO | Source: Ambulatory Visit | Attending: Nephrology | Admitting: Nephrology

## 2022-12-01 DIAGNOSIS — D638 Anemia in other chronic diseases classified elsewhere: Secondary | ICD-10-CM | POA: Insufficient documentation

## 2022-12-01 DIAGNOSIS — R809 Proteinuria, unspecified: Secondary | ICD-10-CM | POA: Insufficient documentation

## 2022-12-01 DIAGNOSIS — E785 Hyperlipidemia, unspecified: Secondary | ICD-10-CM | POA: Insufficient documentation

## 2022-12-01 DIAGNOSIS — N189 Chronic kidney disease, unspecified: Secondary | ICD-10-CM | POA: Diagnosis not present

## 2022-12-01 DIAGNOSIS — E1122 Type 2 diabetes mellitus with diabetic chronic kidney disease: Secondary | ICD-10-CM | POA: Insufficient documentation

## 2022-12-01 DIAGNOSIS — I129 Hypertensive chronic kidney disease with stage 1 through stage 4 chronic kidney disease, or unspecified chronic kidney disease: Secondary | ICD-10-CM | POA: Diagnosis not present

## 2022-12-01 DIAGNOSIS — I5032 Chronic diastolic (congestive) heart failure: Secondary | ICD-10-CM | POA: Insufficient documentation

## 2022-12-01 DIAGNOSIS — N170 Acute kidney failure with tubular necrosis: Secondary | ICD-10-CM | POA: Insufficient documentation

## 2022-12-01 DIAGNOSIS — D472 Monoclonal gammopathy: Secondary | ICD-10-CM | POA: Insufficient documentation

## 2022-12-01 DIAGNOSIS — E1129 Type 2 diabetes mellitus with other diabetic kidney complication: Secondary | ICD-10-CM | POA: Diagnosis not present

## 2022-12-01 LAB — PROTEIN / CREATININE RATIO, URINE
Creatinine, Urine: 179.81 mg/dL
Protein Creatinine Ratio: 0.11 mg/mg{Cre} (ref 0.00–0.15)
Total Protein, Urine: 20 mg/dL

## 2022-12-01 LAB — RENAL FUNCTION PANEL
Albumin: 3.4 g/dL — ABNORMAL LOW (ref 3.5–5.0)
Anion gap: 9 (ref 5–15)
BUN: 20 mg/dL (ref 8–23)
CO2: 25 mmol/L (ref 22–32)
Calcium: 8.5 mg/dL — ABNORMAL LOW (ref 8.9–10.3)
Chloride: 102 mmol/L (ref 98–111)
Creatinine, Ser: 1.66 mg/dL — ABNORMAL HIGH (ref 0.61–1.24)
GFR, Estimated: 42 mL/min — ABNORMAL LOW (ref >60)
Glucose, Bld: 88 mg/dL (ref 70–99)
Phosphorus: 2.1 mg/dL — ABNORMAL LOW (ref 2.5–4.6)
Potassium: 3.7 mmol/L (ref 3.5–5.1)
Sodium: 136 mmol/L (ref 135–145)

## 2022-12-01 LAB — CBC
HCT: 34.7 % — ABNORMAL LOW (ref 39.0–52.0)
Hemoglobin: 11.1 g/dL — ABNORMAL LOW (ref 13.0–17.0)
MCH: 29.4 pg (ref 26.0–34.0)
MCHC: 32 g/dL (ref 30.0–36.0)
MCV: 92 fL (ref 80.0–100.0)
Platelets: 246 10*3/uL (ref 150–400)
RBC: 3.77 MIL/uL — ABNORMAL LOW (ref 4.22–5.81)
RDW: 12.9 % (ref 11.5–15.5)
WBC: 5.8 10*3/uL (ref 4.0–10.5)
nRBC: 0 % (ref 0.0–0.2)

## 2022-12-07 DIAGNOSIS — N189 Chronic kidney disease, unspecified: Secondary | ICD-10-CM | POA: Diagnosis not present

## 2022-12-07 DIAGNOSIS — E1129 Type 2 diabetes mellitus with other diabetic kidney complication: Secondary | ICD-10-CM | POA: Diagnosis not present

## 2022-12-07 DIAGNOSIS — I5032 Chronic diastolic (congestive) heart failure: Secondary | ICD-10-CM | POA: Diagnosis not present

## 2022-12-07 DIAGNOSIS — I129 Hypertensive chronic kidney disease with stage 1 through stage 4 chronic kidney disease, or unspecified chronic kidney disease: Secondary | ICD-10-CM | POA: Diagnosis not present

## 2022-12-07 DIAGNOSIS — R809 Proteinuria, unspecified: Secondary | ICD-10-CM | POA: Diagnosis not present

## 2022-12-07 DIAGNOSIS — D472 Monoclonal gammopathy: Secondary | ICD-10-CM | POA: Diagnosis not present

## 2022-12-07 DIAGNOSIS — E1122 Type 2 diabetes mellitus with diabetic chronic kidney disease: Secondary | ICD-10-CM | POA: Diagnosis not present

## 2022-12-07 DIAGNOSIS — D638 Anemia in other chronic diseases classified elsewhere: Secondary | ICD-10-CM | POA: Diagnosis not present

## 2022-12-16 DIAGNOSIS — E1165 Type 2 diabetes mellitus with hyperglycemia: Secondary | ICD-10-CM | POA: Diagnosis not present

## 2022-12-16 DIAGNOSIS — Z299 Encounter for prophylactic measures, unspecified: Secondary | ICD-10-CM | POA: Diagnosis not present

## 2022-12-16 DIAGNOSIS — R252 Cramp and spasm: Secondary | ICD-10-CM | POA: Diagnosis not present

## 2022-12-16 DIAGNOSIS — I10 Essential (primary) hypertension: Secondary | ICD-10-CM | POA: Diagnosis not present

## 2022-12-16 DIAGNOSIS — Z87891 Personal history of nicotine dependence: Secondary | ICD-10-CM | POA: Diagnosis not present

## 2022-12-22 ENCOUNTER — Ambulatory Visit: Payer: No Typology Code available for payment source | Admitting: Neurology

## 2022-12-27 DIAGNOSIS — I69359 Hemiplegia and hemiparesis following cerebral infarction affecting unspecified side: Secondary | ICD-10-CM | POA: Diagnosis not present

## 2022-12-27 DIAGNOSIS — D472 Monoclonal gammopathy: Secondary | ICD-10-CM | POA: Diagnosis not present

## 2022-12-27 DIAGNOSIS — Z299 Encounter for prophylactic measures, unspecified: Secondary | ICD-10-CM | POA: Diagnosis not present

## 2022-12-27 DIAGNOSIS — E1165 Type 2 diabetes mellitus with hyperglycemia: Secondary | ICD-10-CM | POA: Diagnosis not present

## 2022-12-27 DIAGNOSIS — Z87891 Personal history of nicotine dependence: Secondary | ICD-10-CM | POA: Diagnosis not present

## 2022-12-27 DIAGNOSIS — I10 Essential (primary) hypertension: Secondary | ICD-10-CM | POA: Diagnosis not present

## 2023-01-10 ENCOUNTER — Encounter: Payer: Self-pay | Admitting: *Deleted

## 2023-01-17 ENCOUNTER — Encounter: Payer: Self-pay | Admitting: Neurology

## 2023-01-17 ENCOUNTER — Ambulatory Visit (INDEPENDENT_AMBULATORY_CARE_PROVIDER_SITE_OTHER): Payer: No Typology Code available for payment source | Admitting: Neurology

## 2023-01-17 VITALS — BP 132/56 | HR 61 | Ht 68.0 in | Wt 350.0 lb

## 2023-01-17 DIAGNOSIS — R42 Dizziness and giddiness: Secondary | ICD-10-CM

## 2023-01-17 DIAGNOSIS — R260 Ataxic gait: Secondary | ICD-10-CM

## 2023-01-17 DIAGNOSIS — Z8673 Personal history of transient ischemic attack (TIA), and cerebral infarction without residual deficits: Secondary | ICD-10-CM

## 2023-01-17 NOTE — Patient Instructions (Signed)
I had a long d/w patient about his recent stroke, risk for recurrent stroke/TIAs, personally independently reviewed imaging studies and stroke evaluation results and answered questions.Continue Eliquis (apixaban) daily  for his atrial fibrillation secondary stroke prevention and maintain strict control of hypertension with blood pressure goal below 130/90, diabetes with hemoglobin A1c goal below 6.5% and lipids with LDL cholesterol goal below 70 mg/dL. I also advised the patient to eat a healthy diet with plenty of whole grains, cereals, fruits and vegetables, exercise regularly and maintain ideal body weight I advised him to get up slowly and avoid quick and sudden movements.  He was advised to use his walker at all times and the risk of fall and safety precautions.  Followup in the future with me only as necessary.  Fall Prevention in the Home, Adult Falls can cause injuries and can happen to people of all ages. There are many things you can do to make your home safer and to help prevent falls. What actions can I take to prevent falls? General information Use good lighting in all rooms. Make sure to: Replace any light bulbs that burn out. Turn on the lights in dark areas and use night-lights. Keep items that you use often in easy-to-reach places. Lower the shelves around your home if needed. Move furniture so that there are clear paths around it. Do not use throw rugs or other things on the floor that can make you trip. If any of your floors are uneven, fix them. Add color or contrast paint or tape to clearly mark and help you see: Grab bars or handrails. First and last steps of staircases. Where the edge of each step is. If you use a ladder or stepladder: Make sure that it is fully opened. Do not climb a closed ladder. Make sure the sides of the ladder are locked in place. Have someone hold the ladder while you use it. Know where your pets are as you move through your home. What can I do in  the bathroom?     Keep the floor dry. Clean up any water on the floor right away. Remove soap buildup in the bathtub or shower. Buildup makes bathtubs and showers slippery. Use non-skid mats or decals on the floor of the bathtub or shower. Attach bath mats securely with double-sided, non-slip rug tape. If you need to sit down in the shower, use a non-slip stool. Install grab bars by the toilet and in the bathtub and shower. Do not use towel bars as grab bars. What can I do in the bedroom? Make sure that you have a light by your bed that is easy to reach. Do not use any sheets or blankets on your bed that hang to the floor. Have a firm chair or bench with side arms that you can use for support when you get dressed. What can I do in the kitchen? Clean up any spills right away. If you need to reach something above you, use a step stool with a grab bar. Keep electrical cords out of the way. Do not use floor polish or wax that makes floors slippery. What can I do with my stairs? Do not leave anything on the stairs. Make sure that you have a light switch at the top and the bottom of the stairs. Make sure that there are handrails on both sides of the stairs. Fix handrails that are broken or loose. Install non-slip stair treads on all your stairs if they do not have carpet.  Avoid having throw rugs at the top or bottom of the stairs. Choose a carpet that does not hide the edge of the steps on the stairs. Make sure that the carpet is firmly attached to the stairs. Fix carpet that is loose or worn. What can I do on the outside of my home? Use bright outdoor lighting. Fix the edges of walkways and driveways and fix any cracks. Clear paths of anything that can make you trip, such as tools or rocks. Add color or contrast paint or tape to clearly mark and help you see anything that might make you trip as you walk through a door, such as a raised step or threshold. Trim any bushes or trees on paths to  your home. Check to see if handrails are loose or broken and that both sides of all steps have handrails. Install guardrails along the edges of any raised decks and porches. Have leaves, snow, or ice cleared regularly. Use sand, salt, or ice melter on paths if you live where there is ice and snow during the winter. Clean up any spills in your garage right away. This includes grease or oil spills. What other actions can I take? Review your medicines with your doctor. Some medicines can cause dizziness or changes in blood pressure, which increase your risk of falling. Wear shoes that: Have a low heel. Do not wear high heels. Have rubber bottoms and are closed at the toe. Feel good on your feet and fit well. Use tools that help you move around if needed. These include: Canes. Walkers. Scooters. Crutches. Ask your doctor what else you can do to help prevent falls. This may include seeing a physical therapist to learn to do exercises to move better and get stronger. Where to find more information Centers for Disease Control and Prevention, [place]: StoreMirror.com.cy [employer] on Aging: AquariamTheater.co.nz National Institute on Aging: AquariamTheater.co.nz Contact a doctor if: You are afraid of falling at home. You feel weak, drowsy, or dizzy at home. You fall at home. Get help right away if you: Lose consciousness or have trouble moving after a fall. Have a fall that causes a head injury. These symptoms may be an emergency. Get help right away. Call 911. Do not wait to see if the symptoms will go away. Do not drive yourself to the hospital. This information is not intended to replace advice given to you by your health care provider. Make sure you discuss any questions you have with your health care provider. Document Revised: 06/21/2022 Document Reviewed: 06/21/2022 Elsevier Patient Education  [place].  Stroke Prevention Some medical conditions and behaviors can lead to a higher chance of having  a stroke. You can help prevent a stroke by eating healthy, exercising, not smoking, and managing any medical conditions you have. Stroke is a leading cause of functional impairment. Primary prevention is particularly important because a majority of strokes are first-time events. Stroke changes the lives of not only those who experience a stroke but also their family and other caregivers. How can this condition affect me? A stroke is a medical emergency and should be treated right away. A stroke can lead to brain damage and can sometimes be life-threatening. If a person gets medical treatment right away, there is a better chance of surviving and recovering from a stroke. What can increase my risk? The following medical conditions may increase your risk of a stroke: Cardiovascular disease. High blood pressure (hypertension). Diabetes. High cholesterol. Sickle cell disease. Blood clotting  disorders (hypercoagulable state). Obesity. Sleep disorders (obstructive sleep apnea). Other risk factors include: Being older than age 59. Having a history of blood clots, stroke, or mini-stroke (transient ischemic attack, TIA). Genetic factors, such as race, ethnicity, or a family history of stroke. Smoking cigarettes or using other tobacco products. Taking birth control pills, especially if you also use tobacco. Heavy use of alcohol or drugs, especially cocaine and methamphetamine. Physical inactivity. What actions can I take to prevent this? Manage your health conditions High cholesterol levels. Eating a healthy diet is important for preventing high cholesterol. If cholesterol cannot be managed through diet alone, you may need to take medicines. Take any prescribed medicines to control your cholesterol as told by your health care provider. Hypertension. To reduce your risk of stroke, try to keep your blood pressure below 130/80. Eating a healthy diet and exercising regularly are important for  controlling blood pressure. If these steps are not enough to manage your blood pressure, you may need to take medicines. Take any prescribed medicines to control hypertension as told by your health care provider. Ask your health care provider if you should monitor your blood pressure at home. Have your blood pressure checked every year, even if your blood pressure is normal. Blood pressure increases with age and some medical conditions. Diabetes. Eating a healthy diet and exercising regularly are important parts of managing your blood sugar (glucose). If your blood sugar cannot be managed through diet and exercise, you may need to take medicines. Take any prescribed medicines to control your diabetes as told by your health care provider. Get evaluated for obstructive sleep apnea. Talk to your health care provider about getting a sleep evaluation if you snore a lot or have excessive sleepiness. Make sure that any other medical conditions you have, such as atrial fibrillation or atherosclerosis, are managed. Nutrition Follow instructions from your health care provider about what to eat or drink to help manage your health condition. These instructions may include: Reducing your daily calorie intake. Limiting how much salt (sodium) you use to 1,500 milligrams (mg) each day. Using only healthy fats for cooking, such as olive oil, canola oil, or sunflower oil. Eating healthy foods. You can do this by: Choosing foods that are high in fiber, such as whole grains, and fresh fruits and vegetables. Eating at least 5 servings of fruits and vegetables a day. Try to fill one-half of your plate with fruits and vegetables at each meal. Choosing lean protein foods, such as lean cuts of meat, poultry without skin, fish, tofu, beans, and nuts. Eating low-fat dairy products. Avoiding foods that are high in sodium. This can help lower blood pressure. Avoiding foods that have saturated fat, trans fat, and cholesterol.  This can help prevent high cholesterol. Avoiding processed and prepared foods. Counting your daily carbohydrate intake.  Lifestyle If you drink alcohol: Limit how much you have to: 0-1 drink a day for women who are not pregnant. 0-2 drinks a day for men. Know how much alcohol is in your drink. In the [place] one drink equals one 12 oz bottle of beer (378mL), one 5 oz glass of wine (163mL), or one 1 oz glass of hard liquor (73mL). Do not use any products that contain nicotine or tobacco. These products include cigarettes, chewing tobacco, and vaping devices, such as e-cigarettes. If you need help quitting, ask your health care provider. Avoid secondhand smoke. Do not use drugs. Activity  Try to stay at a healthy weight. Get at least 30  minutes of exercise on most days, such as: Fast walking. Biking. Swimming. Medicines Take over-the-counter and prescription medicines only as told by your health care provider. Aspirin or blood thinners (antiplatelets or anticoagulants) may be recommended to reduce your risk of forming blood clots that can lead to stroke. Avoid taking birth control pills. Talk to your health care provider about the risks of taking birth control pills if: You are over 31 years old. You smoke. You get very bad headaches. You have had a blood clot. Where to find more information American Stroke Association: www.strokeassociation.org Get help right away if: You or a loved one has any symptoms of a stroke. "BE FAST" is an easy way to remember the main warning signs of a stroke: B - Balance. Signs are dizziness, sudden trouble walking, or loss of balance. E - Eyes. Signs are trouble seeing or a sudden change in vision. F - Face. Signs are sudden weakness or numbness of the face, or the face or eyelid drooping on one side. A - Arms. Signs are weakness or numbness in an arm. This happens suddenly and usually on one side of the body. S - Speech. Signs are sudden trouble  speaking, slurred speech, or trouble understanding what people say. T - Time. Time to call emergency services. Write down what time symptoms started. You or a loved one has other signs of a stroke, such as: A sudden, severe headache with no known cause. Nausea or vomiting. Seizure. These symptoms may represent a serious problem that is an emergency. Do not wait to see if the symptoms will go away. Get medical help right away. Call your local emergency services (911 in the [place] Do not drive yourself to the hospital. Summary You can help to prevent a stroke by eating healthy, exercising, not smoking, limiting alcohol intake, and managing any medical conditions you may have. Do not use any products that contain nicotine or tobacco. These include cigarettes, chewing tobacco, and vaping devices, such as e-cigarettes. If you need help quitting, ask your health care provider. Remember "BE FAST" for warning signs of a stroke. Get help right away if you or a loved one has any of these signs. This information is not intended to replace advice given to you by your health care provider. Make sure you discuss any questions you have with your health care provider. Document Revised: 05/01/2020 Document Reviewed: 05/19/2020 Elsevier Patient Education  [place].

## 2023-01-17 NOTE — Progress Notes (Signed)
[facility] [address] [place]. [place] [address] [phone]       OFFICE CONSULT NOTE  [name] Date of Birth:  16-Jun-1941 Medical Record Number:  [id number]   Referring MD:  [name]  Reason for Referral:  Stroke  HPI: [name] is a 82 year old African-American male seen today for initial office consultation visit for stroke.  History is obtained from the patient and review of referral notes and electronic medical records.  I have reviewed pertinent available imaging films in PACS.  He has past medical history of morbid obesity, hypertension, hyperlipidemia, diabetes, chronic kidney disease, paroxysmal A-fib, peripheral vascular disease and sleep apnea.  Patient presented on 07/28/2022 with sudden onset of dizziness and vomiting.  He vomited several times and felt dizzy and off balance.  He denies any slurred speech, diplopia or focal extremity numbness.  MRI scan of the brain personally reviewed shows no acute abnormality.  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  Patient symptoms resolved and patient was seen by teleneurologist and is unclear whether patient has peripheral vascular dysfunction versus small brainstem infarct not visualized on MRI.  Patient does have a prior history of stroke in November 2021.  Bilateral lacunar strokes and was off balance.  He was seen by Dr. Merlene [name] at [facility].  Subsequently was found to have paroxysmal A-fib and is currently on Eliquis which is tolerating well without bruising or bleeding.  States his blood pressure is under good control.  He is also on Lipitor which is tolerating well without muscle aches and pains.  He states lipid profile checked a month ago by primary care physician was quite satisfactory.  He states he has had no recurrent stroke or TIA symptoms since this episode 6 months  ago.  ROS:   14 system review of systems is positive for dizziness, imbalance, gait difficulty, all other systems negative  PMH:  Past Medical History:  Diagnosis Date   Anemia    C. 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 ([place]) 05/20/2011   Dyspnea 02/20/2018   GERD (gastroesophageal reflux disease)    Glaucoma    Hyperlipidemia    Hypertension    Morbid obesity ([place])    PAF (paroxysmal atrial fibrillation) ([place])    Peripheral vascular disease ([place])    Sleep apnea    Stroke ([place]) 2008, 2014, 2015    Social History:  Social History   Socioeconomic History   Marital status: Married    Spouse name: [name]   Number of children: Not on file   Years of education: Not on file   Highest education level: Not on file  Occupational History   Not on file  Tobacco Use   Smoking status: Former    Packs/day: 1.00    Years: 20.00    Additional pack years: 0.00    Total pack years: 20.00    Types: Cigarettes    Quit date: 60    Years since quitting: 41.2   Smokeless tobacco: Never  Vaping Use   Vaping Use: Never used  Substance and Sexual Activity   Alcohol use: No  Drug use: No   Sexual activity: Not Currently  Other Topics Concern   Not on file  Social History Narrative   1 child, 4 step-children    Social Determinants of Health   Financial Resource Strain: Not on file  Food Insecurity: Not on file  Transportation Needs: Not on file  Physical Activity: Not on file  Stress: Not on file  Social Connections: Not on file  Intimate Partner Violence: Not on file    Medications:   Current Outpatient Medications on File Prior to Visit  Medication Sig Dispense Refill   acetaminophen (TYLENOL) 650 MG CR tablet Take 1,300 mg by mouth every 8 (eight) hours as needed for pain.      albuterol (VENTOLIN HFA) 108 (90 Base) MCG/ACT inhaler Inhale 1-2 puffs into the lungs every 6 (six) hours as needed for shortness of breath.     amLODipine (NORVASC) 10 MG tablet Take 1 tablet (10 mg total) by mouth daily. 30 tablet 1   apixaban (ELIQUIS) 5 MG TABS tablet Take 1 tablet (5 mg total) by mouth 2 (two) times daily. 14 tablet 0   atorvastatin (LIPITOR) 80 MG tablet Take 40 mg by mouth every evening.     carboxymethylcellulose (REFRESH PLUS) 0.5 % SOLN Apply to eye.     Cyanocobalamin (VITAMIN B 12) 500 MCG TABS Take 1,000 mcg by mouth daily. 180 tablet 1   glucose-Vitamin C 4-0.006 GM CHEW chewable tablet Chew 4 tablets by mouth as directed. CHEW FOUR TABLETS BY MOUTH AS DIRECTED BY YOUR PROVIDER (REPEAT EVERY 15 MINUTES IF BLOOD SUGAR LESS THAN 70)     insulin aspart protamine- aspart (NOVOLOG MIX 70/30) (70-30) 100 UNIT/ML injection Inject 0.4 mLs (40 Units total) into the skin 2 (two) times daily with a meal.     isosorbide mononitrate (IMDUR) 30 MG 24 hr tablet Take 1 tablet (30 mg total) by mouth every evening. 90 tablet 3   metoprolol tartrate (LOPRESSOR) 50 MG tablet Take 25 mg by mouth 2 (two) times daily.     mycophenolate (CELLCEPT) 500 MG tablet Take 2 tablets by mouth 2 (two) times daily.     pioglitazone (ACTOS) 30 MG tablet Take 30 mg by mouth daily.     Semaglutide (OZEMPIC, 0.25 OR 0.5 MG/DOSE, [place]) Inject 0.25 mg into the skin once a week.     spironolactone (ALDACTONE) 25 MG tablet Take 0.5 tablets (12.5 mg total) by mouth daily. 45 tablet 1   tamsulosin (FLOMAX) 0.4 MG CAPS capsule Take 0.4 mg by mouth every evening.     Tiotropium Bromide-Olodaterol 2.5-2.5 MCG/ACT AERS Inhale 2 puffs into the lungs daily.     torsemide (DEMADEX) 20 MG tablet Take 40 mg by mouth daily.     Vitamin D, Ergocalciferol, (DRISDOL) 1.25 MG (50000 UNIT) CAPS capsule Take 50,000 Units by mouth once a week.     No current facility-administered medications on file prior to visit.     Allergies:   Allergies  Allergen Reactions   Ivp Dye [Iodinated Contrast Media] Rash    Physical Exam General: Morbidly obese elderly African-American male, seated, in no evident distress Head: head normocephalic and atraumatic.   Neck: supple with no carotid or supraclavicular bruits Cardiovascular: regular rate and rhythm, no murmurs Musculoskeletal: no deformity Skin:  no rash/petichiae Vascular:  Normal pulses all extremities  Neurologic Exam Mental Status: Awake and fully alert. Oriented to place and time. Recent and remote memory intact. Attention span, concentration and fund of knowledge appropriate.  Mood and affect appropriate.  Cranial Nerves: Fundoscopic exam reveals sharp disc margins. Pupils equal, briskly reactive to light. Extraocular movements full without nystagmus. Visual fields full to confrontation. Hearing intact. Facial sensation intact.  Mild left lower facial asymmetry when he smiles., tongue, palate moves normally and symmetrically.  Motor: Normal bulk and tone. Normal strength in all tested extremity muscles.  Diminished fine finger movements on the left.  Orbits right over left upper extremity. Sensory.: intact to touch , pinprick , position and vibratory sensation.  Coordination: Rapid alternating movements normal in all extremities. Finger-to-nose and heel-to-shin performed accurately bilaterally.  Unsteady while standing on either foot unsupported. Gait and Station: Arises from chair without difficulty. Stance is normal. Gait slightly broad-based.  Uses wheeled walker.  Not able to heel, toe and tandem walk   Reflexes: 1+ and symmetric. Toes downgoing.   NIHSS  1 Modified Rankin  2   ASSESSMENT: 82 year old African-American male with episode of transient vertigo and dizziness in  September 2023  unclear etiology possibly peripheral vestibular etiology versus small infarct not visualized on MRI.  Prior history of right pontine lacunar infarct in January  2015, right frontal white matter lacune in October 2014 and remote bilateral thalamic and left cerebellar infarcts.  Vascular risk factors of diabetes, hypertension, hyperlipidemia, paroxysmal AF, morbid obesity, sleep apnea and small vessel disease     PLAN:I had a long d/w patient about his recent stroke, risk for recurrent stroke/TIAs, personally independently reviewed imaging studies and stroke evaluation results and answered questions.Continue Eliquis (apixaban) daily  for his atrial fibrillation secondary stroke prevention and maintain strict control of hypertension with blood pressure goal below 130/90, diabetes with hemoglobin A1c goal below 6.5% and lipids with LDL cholesterol goal below 70 mg/dL. I also advised the patient to eat a healthy diet with plenty of whole grains, cereals, fruits and vegetables, exercise regularly and maintain ideal body weight I advised him to get up slowly and avoid quick and sudden movements.  He was advised to use his walker at all times and the risk of fall and safety precautions.  Followup in the future with me only as necessary. Greater than 50% time during the 45-minute consultation was and coordination of care about his multiple strokes and residual dizziness.  Discussed about fall prevention and answering questions. Antony Contras, MD Note: This document was prepared with digital dictation and possible smart phrase technology. Any transcriptional errors that result from this process are unintentional.

## 2023-01-18 ENCOUNTER — Ambulatory Visit: Payer: Medicare PPO | Attending: Cardiology | Admitting: Cardiology

## 2023-01-18 ENCOUNTER — Encounter: Payer: Self-pay | Admitting: Cardiology

## 2023-01-18 VITALS — BP 144/60 | HR 62 | Ht 68.0 in | Wt 263.4 lb

## 2023-01-18 DIAGNOSIS — I25119 Atherosclerotic heart disease of native coronary artery with unspecified angina pectoris: Secondary | ICD-10-CM

## 2023-01-18 DIAGNOSIS — I480 Paroxysmal atrial fibrillation: Secondary | ICD-10-CM | POA: Diagnosis not present

## 2023-01-18 DIAGNOSIS — N1832 Chronic kidney disease, stage 3b: Secondary | ICD-10-CM | POA: Diagnosis not present

## 2023-01-18 DIAGNOSIS — I5032 Chronic diastolic (congestive) heart failure: Secondary | ICD-10-CM | POA: Diagnosis not present

## 2023-01-18 MED ORDER — EZETIMIBE 10 MG PO TABS: 10.0000 mg | ORAL_TABLET | Freq: Every day | ORAL | 3 refills | Status: DC

## 2023-01-18 NOTE — Progress Notes (Signed)
Cardiology Office Note  Date: 01/18/2023   ID: [name], DOB [birth year], MRN [id number]  History of Present Illness: [name] is a 82 y.o. male last seen in September 2023.  He is here today with his wife for a follow-up visit.  I reviewed interval records and his most recent lab work.  Overall, leg swelling has been fairly well-controlled, tends to be asymmetric with prior vein harvesting.  His weight at home has been in the 250s and stable.  He does not report any sense of palpitations, no chest pain.  Does mention right breast soreness.  States that this was an issue several years ago when he had a mammogram, although ultimately it was felt to be medication related.  He is on Aldactone 12.5 mg daily which we discussed stopping for now.  I reviewed the remainder of his cardiac medications which are stable.  He is on Lipitor 80 mg daily with last LDL 74.  Discussed addition of Zetia 10 mg daily.  Physical Exam: VS:  BP (!) 144/60   Pulse 62   Ht 5\' 8"  (1.727 m)   Wt 263 lb 6.4 oz (119.5 kg)   SpO2 99%   BMI 40.05 kg/m , BMI Body mass index is 40.05 kg/m.  Wt Readings from Last 3 Encounters:  01/18/23 263 lb 6.4 oz (119.5 kg)  01/17/23 (!) 350 lb (158.8 kg)  07/28/22 (!) 357 lb 9.4 oz (162.2 kg)    General: Patient appears comfortable at rest. HEENT: Conjunctiva and lids normal. Neck: Supple, no elevated JVP or carotid bruits, no thyromegaly. Lungs: Clear to auscultation, nonlabored breathing at rest. Chest: No palpable masses anterior right breast.  No drainage. Cardiac: Regular rate and rhythm, no S3, 1/6 systolic murmur.. Extremities: Stable leg edema, left greater than right.  ECG:  An ECG dated 07/10/2022 was personally reviewed today and demonstrated:  Sinus rhythm with left anterior fascicular block.  Labwork: 07/10/2022: B Natriuretic Peptide 43.0 07/28/2022: ALT 18; AST 20; Magnesium 2.1 12/01/2022: BUN 20; Creatinine, Ser 1.66; Hemoglobin 11.1; Platelets 246;  Potassium 3.7; Sodium 136  November 2023: Cholesterol 133, triglycerides 84, HDL 43, LDL 74, TSH 2.97  Other Studies Reviewed Today:  Echocardiogram 01/08/2022:  1. Left ventricular ejection fraction, by estimation, is 55 to 60%. The  left ventricle has normal function. The left ventricle has no regional  wall motion abnormalities. There is moderate concentric left ventricular  hypertrophy. Left ventricular  diastolic parameters were normal.   2. RV-RA gradient 26 mmHg suggesting normal estimated RVSP in setting of  normal CVP. Right ventricular systolic function is normal. The right  ventricular size is normal.   3. Left atrial size was mildly dilated.   4. The mitral valve is grossly normal. Trivial mitral valve  regurgitation.   5. The aortic valve is tricuspid. There is mild calcification of the  aortic valve. Aortic valve regurgitation is not visualized. Aortic valve  sclerosis/calcification is present, without any evidence of aortic  stenosis.   6. Unable to estimate CVP.   Carotid Dopplers 07/28/2022: IMPRESSION: 1. Mild (1-49%) stenosis proximal right internal carotid artery secondary to heterogenous atherosclerotic plaque. 2. Mild (1-49%) stenosis proximal left internal carotid artery secondary to heterogenous atherosclerotic plaque. 3. Vertebral arteries are patent with normal antegrade flow.  Assessment and Plan:  1.  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  Reports  subjectively stable improving leg edema on Demadex 40 mg daily, not requiring potassium supplement.  Given right breast soreness we will stop Aldactone completely for now.  2.  Paroxysmal atrial fibrillation/flutter with CHA2DS2-VASc score of 6.  No reported palpitations.  He remains on Eliquis for stroke prophylaxis and denies any spontaneous bleeding problems.  3.  Multivessel CAD status post  CABG in 2007.  He does not report any active angina at this time.  Continue Imdur, Norvasc, Lopressor, and Lipitor.  We are adding Zetia 10 mg daily in light of last LDL 74.  4.  CKD stage IIIb, follows with Dr. Theador Hawthorne.  Recent creatinine 1.76 with normal potassium.  Disposition:  Follow up  6 months in the [place] office.  Signed, Satira Sark, M.D., F.A.C.C.

## 2023-01-18 NOTE — Patient Instructions (Addendum)
Medication Instructions:  Your physician has recommended you make the following change in your medication:  Stop spironolactone Start zetia 10 mg daily Continue other medications the same  Labwork: none  Testing/Procedures: none  Follow-Up: Your physician recommends that you schedule a follow-up appointment in: 6 months in [place]  Any Other Special Instructions Will Be Listed Below (If Applicable).  If you need a refill on your cardiac medications before your next appointment, please call your pharmacy.

## 2023-01-29 DIAGNOSIS — E1165 Type 2 diabetes mellitus with hyperglycemia: Secondary | ICD-10-CM | POA: Diagnosis not present

## 2023-01-31 ENCOUNTER — Emergency Department (HOSPITAL_COMMUNITY): Payer: No Typology Code available for payment source

## 2023-01-31 ENCOUNTER — Other Ambulatory Visit: Payer: Self-pay

## 2023-01-31 ENCOUNTER — Emergency Department (HOSPITAL_COMMUNITY)
Admission: EM | Admit: 2023-01-31 | Discharge: 2023-01-31 | Disposition: A | Discharge status: Home / Self Care | Payer: No Typology Code available for payment source | Attending: Emergency Medicine | Admitting: Emergency Medicine

## 2023-01-31 DIAGNOSIS — Z79899 Other long term (current) drug therapy: Secondary | ICD-10-CM | POA: Diagnosis not present

## 2023-01-31 DIAGNOSIS — I2510 Atherosclerotic heart disease of native coronary artery without angina pectoris: Secondary | ICD-10-CM | POA: Insufficient documentation

## 2023-01-31 DIAGNOSIS — Z7901 Long term (current) use of anticoagulants: Secondary | ICD-10-CM | POA: Diagnosis not present

## 2023-01-31 DIAGNOSIS — Z794 Long term (current) use of insulin: Secondary | ICD-10-CM | POA: Insufficient documentation

## 2023-01-31 DIAGNOSIS — M542 Cervicalgia: Secondary | ICD-10-CM | POA: Diagnosis not present

## 2023-01-31 DIAGNOSIS — R7989 Other specified abnormal findings of blood chemistry: Secondary | ICD-10-CM | POA: Diagnosis not present

## 2023-01-31 DIAGNOSIS — E119 Type 2 diabetes mellitus without complications: Secondary | ICD-10-CM | POA: Diagnosis not present

## 2023-01-31 DIAGNOSIS — I10 Essential (primary) hypertension: Secondary | ICD-10-CM | POA: Insufficient documentation

## 2023-01-31 LAB — COMPREHENSIVE METABOLIC PANEL
ALT: 18 U/L (ref 0–44)
AST: 19 U/L (ref 15–41)
Albumin: 3.3 g/dL — ABNORMAL LOW (ref 3.5–5.0)
Alkaline Phosphatase: 53 U/L (ref 38–126)
Anion gap: 8 (ref 5–15)
BUN: 24 mg/dL — ABNORMAL HIGH (ref 8–23)
CO2: 25 mmol/L (ref 22–32)
Calcium: 8.5 mg/dL — ABNORMAL LOW (ref 8.9–10.3)
Chloride: 103 mmol/L (ref 98–111)
Creatinine, Ser: 1.73 mg/dL — ABNORMAL HIGH (ref 0.61–1.24)
GFR, Estimated: 40 mL/min — ABNORMAL LOW (ref >60)
Glucose, Bld: 95 mg/dL (ref 70–99)
Potassium: 4.1 mmol/L (ref 3.5–5.1)
Sodium: 136 mmol/L (ref 135–145)
Total Bilirubin: 0.7 mg/dL (ref 0.3–1.2)
Total Protein: 7.1 g/dL (ref 6.5–8.1)

## 2023-01-31 LAB — CBC WITH DIFFERENTIAL/PLATELET
Abs Immature Granulocytes: 0.01 10*3/uL (ref 0.00–0.07)
Basophils Absolute: 0 10*3/uL (ref 0.0–0.1)
Basophils Relative: 0 %
Eosinophils Absolute: 0.2 10*3/uL (ref 0.0–0.5)
Eosinophils Relative: 3 %
HCT: 33.1 % — ABNORMAL LOW (ref 39.0–52.0)
Hemoglobin: 10.2 g/dL — ABNORMAL LOW (ref 13.0–17.0)
Immature Granulocytes: 0 %
Lymphocytes Relative: 25 %
Lymphs Abs: 1.2 10*3/uL (ref 0.7–4.0)
MCH: 29.7 pg (ref 26.0–34.0)
MCHC: 30.8 g/dL (ref 30.0–36.0)
MCV: 96.2 fL (ref 80.0–100.0)
Monocytes Absolute: 0.9 10*3/uL (ref 0.1–1.0)
Monocytes Relative: 18 %
Neutro Abs: 2.6 10*3/uL (ref 1.7–7.7)
Neutrophils Relative %: 54 %
Platelets: 221 10*3/uL (ref 150–400)
RBC: 3.44 MIL/uL — ABNORMAL LOW (ref 4.22–5.81)
RDW: 13.2 % (ref 11.5–15.5)
WBC: 4.9 10*3/uL (ref 4.0–10.5)
nRBC: 0 % (ref 0.0–0.2)

## 2023-01-31 MED ORDER — TRAMADOL HCL 50 MG PO TABS: 50.0000 mg | ORAL_TABLET | Freq: Four times a day (QID) | ORAL | 0 refills | Status: DC | PRN

## 2023-01-31 NOTE — ED Provider Notes (Signed)
[facility] Provider Note   CSN: [id number] Arrival date & time: 01/31/23  1233     History Chief Complaint  Patient presents with  . Torticollis    [name] is a 82 y.o. male.  Patient past history significant for hypertension, type 2 diabetes, coronary artery disease, stroke presents emergency department complaints of neck pain. He reports that pain began yesterday without specific trigger such as a fall or specific injury to the area. Denies any headaches, nausea, blurry vision, congestion, sore throat, chest pain, shortness of breath, abdominal pain, vomiting, or diarrhea.  Per patient's wife, patient is being evaluated at the [place] out of concern about possible cancer as he was recently diagnosed with a concerning mass or tumor located somewhere in his left arm.  Patient denies any recent significant fatigue, weight loss, fevers, chills, night sweats. Currently on Apixaban. Tried taking Tylenol without relief in symptoms.  HPI     Home Medications Prior to Admission medications   Medication Sig Start Date End Date Taking? Authorizing Provider  acetaminophen (TYLENOL) 650 MG CR tablet Take 1,300 mg by mouth every 8 (eight) hours as needed for pain.    [provider]  albuterol (VENTOLIN HFA) 108 (90 Base) MCG/ACT inhaler Inhale 1-2 puffs into the lungs every 6 (six) hours as needed for shortness of breath. 03/23/22   [provider]  amLODipine (NORVASC) 10 MG tablet Take 1 tablet (10 mg total) by mouth daily. 07/29/22   Johnson, Clanford L, MD  apixaban (ELIQUIS) 5 MG TABS tablet Take 1 tablet (5 mg total) by mouth 2 (two) times daily. 04/28/22   Satira Sark, MD  atorvastatin (LIPITOR) 80 MG tablet Take 40 mg by mouth every evening.    [provider]  carboxymethylcellulose (REFRESH PLUS) 0.5 % SOLN Apply to eye. 03/09/22   [provider]  Cyanocobalamin (VITAMIN B 12) 500 MCG TABS Take 1,000 mcg  by mouth daily. 03/07/20   Alycia Rossetti, MD  ezetimibe (ZETIA) 10 MG tablet Take 1 tablet (10 mg total) by mouth daily. 01/18/23   Satira Sark, MD  glucose-Vitamin C 4-0.006 GM CHEW chewable tablet Chew 4 tablets by mouth as directed. CHEW FOUR TABLETS BY MOUTH AS DIRECTED BY YOUR PROVIDER (REPEAT EVERY 15 MINUTES IF BLOOD SUGAR LESS THAN 70) 03/09/22   [provider]  insulin aspart protamine- aspart (NOVOLOG MIX 70/30) (70-30) 100 UNIT/ML injection Inject 0.4 mLs (40 Units total) into the skin 2 (two) times daily with a meal. 07/29/22   Johnson, Clanford L, MD  isosorbide mononitrate (IMDUR) 30 MG 24 hr tablet Take 1 tablet (30 mg total) by mouth every evening. 01/08/22 07/21/23  Satira Sark, MD  metoprolol tartrate (LOPRESSOR) 50 MG tablet Take 25 mg by mouth 2 (two) times daily.    [provider]  mycophenolate (CELLCEPT) 500 MG tablet Take 2 tablets by mouth 2 (two) times daily. 05/21/22   [provider]  pioglitazone (ACTOS) 30 MG tablet Take 30 mg by mouth daily. 03/09/22   [provider]  Semaglutide (OZEMPIC, 0.25 OR 0.5 MG/DOSE, [place]) Inject 0.25 mg into the skin once a week. 02/26/22   [provider]  tamsulosin (FLOMAX) 0.4 MG CAPS capsule Take 0.4 mg by mouth every evening.    [provider]  Tiotropium Bromide-Olodaterol 2.5-2.5 MCG/ACT AERS Inhale 2 puffs into the lungs daily. 03/23/22   [provider]  torsemide (DEMADEX) 20 MG tablet  Take 40 mg by mouth daily.    [provider]  Vitamin D, Ergocalciferol, (DRISDOL) 1.25 MG (50000 UNIT) CAPS capsule Take 50,000 Units by mouth once a week. 06/02/22   [provider]      Allergies    Ivp dye [iodinated contrast media]    Review of Systems   Review of Systems  Musculoskeletal:  Positive for neck pain.  All other systems reviewed and are negative.   Physical Exam Updated Vital Signs BP (!) 140/79   Pulse (!) 54   Temp 97.7 F (36.5 C)  (Oral)   Resp 18   SpO2 100%  Physical Exam Vitals and nursing note reviewed.  Constitutional:      General: He is not in acute distress.    Appearance: He is well-developed.  HENT:     Head: Normocephalic and atraumatic.  Eyes:     Conjunctiva/sclera: Conjunctivae normal.  Neck:     Comments: Limited ROM in all directions due to pain Cardiovascular:     Rate and Rhythm: Normal rate and regular rhythm.     Heart sounds: No murmur heard. Pulmonary:     Effort: Pulmonary effort is normal. No respiratory distress.     Breath sounds: Normal breath sounds.  Abdominal:     Palpations: Abdomen is soft.     Tenderness: There is no abdominal tenderness.  Musculoskeletal:        General: No swelling.     Cervical back: Neck supple. Tenderness present.  Skin:    General: Skin is warm and dry.     Capillary Refill: Capillary refill takes less than 2 seconds.  Neurological:     Mental Status: He is alert.  Psychiatric:        Mood and Affect: Mood normal.     ED Results / Procedures / Treatments   Labs (all labs ordered are listed, but only abnormal results are displayed) Labs Reviewed - No data to display  EKG None  Radiology No results found.  Procedures Procedures   Medications Ordered in ED Medications - No data to display  ED Course/ Medical Decision Making/ A&P Clinical Course as of 01/31/23 1530  Mon Jan 31, 2023  1528 CT Cervical Spine Wo Contrast [OZ]    Clinical Course User Index [OZ] Luvenia Heller, PA-C                           Medical Decision Making  This patient presents to the ED for concern of neck pain. Differential diagnosis includes viral URI, stroke, meningitis, migraine   Lab Tests:  I Ordered, and personally interpreted labs.  The pertinent results include:  ***   Imaging Studies ordered:  I ordered imaging studies including ***  I independently visualized and interpreted imaging which showed *** I agree with the radiologist  interpretation   Medicines ordered and prescription drug management:  I ordered medication including ***  for ***  Reevaluation of the patient after these medicines showed that the patient {resolved/improved/worsened:23923::"improved"} I have reviewed the patients home medicines and have made adjustments as needed   Problem List / ED Course:  ***   Social Determinants of Health:    Final Clinical Impression(s) / ED Diagnoses Final diagnoses:  None    Rx / DC Orders ED Discharge Orders     None

## 2023-01-31 NOTE — ED Triage Notes (Signed)
Pt states he is having neck pain.  Hurts worse when he tries to turn his neck.  Started yesterday.  Denies injury

## 2023-01-31 NOTE — ED Notes (Signed)
Pt and family mentioned that there is a history of agent orange exposure. Recent shoulder xrays showed concerns for cancer and PCP wanted an xray of the neck. Pt has not yet had imaging of neck.

## 2023-01-31 NOTE — Discharge Instructions (Addendum)
You were seen in the emergency department for neck pain. Thankfully your CT scan did not show any masses, but there is evidence of arthritic changes as well as some narrowing of neural canals which could be causing some of your pain. I would recommend continued medication use to manage symptoms as best as possible and daily gentle stretching. Plan to follow up with PCP for further evaluation if symptoms are not improving.

## 2023-03-01 DIAGNOSIS — E1165 Type 2 diabetes mellitus with hyperglycemia: Secondary | ICD-10-CM | POA: Diagnosis not present

## 2023-03-25 DIAGNOSIS — E1122 Type 2 diabetes mellitus with diabetic chronic kidney disease: Secondary | ICD-10-CM | POA: Diagnosis not present

## 2023-03-25 DIAGNOSIS — E1165 Type 2 diabetes mellitus with hyperglycemia: Secondary | ICD-10-CM | POA: Diagnosis not present

## 2023-03-25 DIAGNOSIS — N183 Chronic kidney disease, stage 3 unspecified: Secondary | ICD-10-CM | POA: Diagnosis not present

## 2023-03-25 DIAGNOSIS — I10 Essential (primary) hypertension: Secondary | ICD-10-CM | POA: Diagnosis not present

## 2023-03-25 DIAGNOSIS — Z299 Encounter for prophylactic measures, unspecified: Secondary | ICD-10-CM | POA: Diagnosis not present

## 2023-04-01 DIAGNOSIS — E1165 Type 2 diabetes mellitus with hyperglycemia: Secondary | ICD-10-CM | POA: Diagnosis not present

## 2023-04-04 DIAGNOSIS — Z299 Encounter for prophylactic measures, unspecified: Secondary | ICD-10-CM | POA: Diagnosis not present

## 2023-04-04 DIAGNOSIS — L729 Follicular cyst of the skin and subcutaneous tissue, unspecified: Secondary | ICD-10-CM | POA: Diagnosis not present

## 2023-04-04 DIAGNOSIS — I10 Essential (primary) hypertension: Secondary | ICD-10-CM | POA: Diagnosis not present

## 2023-04-20 DIAGNOSIS — N6310 Unspecified lump in the right breast, unspecified quadrant: Secondary | ICD-10-CM | POA: Diagnosis not present

## 2023-04-20 DIAGNOSIS — R238 Other skin changes: Secondary | ICD-10-CM | POA: Diagnosis not present

## 2023-04-20 DIAGNOSIS — I10 Essential (primary) hypertension: Secondary | ICD-10-CM | POA: Diagnosis not present

## 2023-04-20 DIAGNOSIS — L729 Follicular cyst of the skin and subcutaneous tissue, unspecified: Secondary | ICD-10-CM | POA: Diagnosis not present

## 2023-04-20 DIAGNOSIS — Z299 Encounter for prophylactic measures, unspecified: Secondary | ICD-10-CM | POA: Diagnosis not present

## 2023-04-26 DIAGNOSIS — L720 Epidermal cyst: Secondary | ICD-10-CM | POA: Diagnosis not present

## 2023-05-01 DIAGNOSIS — E1165 Type 2 diabetes mellitus with hyperglycemia: Secondary | ICD-10-CM | POA: Diagnosis not present

## 2023-05-04 ENCOUNTER — Other Ambulatory Visit (HOSPITAL_COMMUNITY)
Admission: RE | Admit: 2023-05-04 | Discharge: 2023-05-04 | Disposition: A | Discharge status: Home / Self Care | Payer: Medicare Other | Source: Ambulatory Visit | Attending: Nephrology | Admitting: Nephrology

## 2023-05-04 DIAGNOSIS — I129 Hypertensive chronic kidney disease with stage 1 through stage 4 chronic kidney disease, or unspecified chronic kidney disease: Secondary | ICD-10-CM | POA: Diagnosis not present

## 2023-05-04 DIAGNOSIS — Z79899 Other long term (current) drug therapy: Secondary | ICD-10-CM | POA: Diagnosis not present

## 2023-05-04 DIAGNOSIS — N2581 Secondary hyperparathyroidism of renal origin: Secondary | ICD-10-CM | POA: Diagnosis not present

## 2023-05-04 DIAGNOSIS — N1832 Chronic kidney disease, stage 3b: Secondary | ICD-10-CM | POA: Diagnosis not present

## 2023-05-04 LAB — CBC WITH DIFFERENTIAL/PLATELET
Abs Immature Granulocytes: 0.01 10*3/uL (ref 0.00–0.07)
Basophils Absolute: 0 10*3/uL (ref 0.0–0.1)
Basophils Relative: 0 %
Eosinophils Absolute: 0.1 10*3/uL (ref 0.0–0.5)
Eosinophils Relative: 3 %
HCT: 39.8 % (ref 39.0–52.0)
Hemoglobin: 12.2 g/dL — ABNORMAL LOW (ref 13.0–17.0)
Immature Granulocytes: 0 %
Lymphocytes Relative: 26 %
Lymphs Abs: 1.3 10*3/uL (ref 0.7–4.0)
MCH: 28.5 pg (ref 26.0–34.0)
MCHC: 30.7 g/dL (ref 30.0–36.0)
MCV: 93 fL (ref 80.0–100.0)
Monocytes Absolute: 0.5 10*3/uL (ref 0.1–1.0)
Monocytes Relative: 10 %
Neutro Abs: 3 10*3/uL (ref 1.7–7.7)
Neutrophils Relative %: 61 %
Platelets: 172 10*3/uL (ref 150–400)
RBC: 4.28 MIL/uL (ref 4.22–5.81)
RDW: 13.9 % (ref 11.5–15.5)
WBC: 4.8 10*3/uL (ref 4.0–10.5)
nRBC: 0 % (ref 0.0–0.2)

## 2023-05-04 LAB — RENAL FUNCTION PANEL
Albumin: 3.3 g/dL — ABNORMAL LOW (ref 3.5–5.0)
Anion gap: 7 (ref 5–15)
BUN: 27 mg/dL — ABNORMAL HIGH (ref 8–23)
CO2: 25 mmol/L (ref 22–32)
Calcium: 8.5 mg/dL — ABNORMAL LOW (ref 8.9–10.3)
Chloride: 101 mmol/L (ref 98–111)
Creatinine, Ser: 1.77 mg/dL — ABNORMAL HIGH (ref 0.61–1.24)
GFR, Estimated: 38 mL/min — ABNORMAL LOW (ref >60)
Glucose, Bld: 260 mg/dL — ABNORMAL HIGH (ref 70–99)
Phosphorus: 3.2 mg/dL (ref 2.5–4.6)
Potassium: 4.5 mmol/L (ref 3.5–5.1)
Sodium: 133 mmol/L — ABNORMAL LOW (ref 135–145)

## 2023-05-04 LAB — PROTEIN / CREATININE RATIO, URINE
Creatinine, Urine: 196 mg/dL
Protein Creatinine Ratio: 0.14 mg/mg{Cre} (ref 0.00–0.15)
Total Protein, Urine: 28 mg/dL

## 2023-05-05 LAB — PTH, INTACT AND CALCIUM
Calcium, Total (PTH): 8.6 mg/dL (ref 8.6–10.2)
PTH: 42 pg/mL (ref 15–65)

## 2023-05-20 DIAGNOSIS — L729 Follicular cyst of the skin and subcutaneous tissue, unspecified: Secondary | ICD-10-CM | POA: Diagnosis not present

## 2023-05-20 DIAGNOSIS — N62 Hypertrophy of breast: Secondary | ICD-10-CM | POA: Diagnosis not present

## 2023-05-20 DIAGNOSIS — L80 Vitiligo: Secondary | ICD-10-CM | POA: Diagnosis not present

## 2023-05-20 DIAGNOSIS — I10 Essential (primary) hypertension: Secondary | ICD-10-CM | POA: Diagnosis not present

## 2023-05-20 DIAGNOSIS — D472 Monoclonal gammopathy: Secondary | ICD-10-CM | POA: Diagnosis not present

## 2023-05-20 DIAGNOSIS — Z299 Encounter for prophylactic measures, unspecified: Secondary | ICD-10-CM | POA: Diagnosis not present

## 2023-05-27 ENCOUNTER — Encounter (HOSPITAL_COMMUNITY): Payer: Self-pay | Admitting: Emergency Medicine

## 2023-05-27 ENCOUNTER — Emergency Department (HOSPITAL_COMMUNITY)
Admission: EM | Admit: 2023-05-27 | Discharge: 2023-05-27 | Disposition: A | Discharge status: Home / Self Care | Payer: No Typology Code available for payment source | Attending: Emergency Medicine | Admitting: Emergency Medicine

## 2023-05-27 ENCOUNTER — Other Ambulatory Visit: Payer: Self-pay

## 2023-05-27 ENCOUNTER — Emergency Department (HOSPITAL_COMMUNITY): Payer: No Typology Code available for payment source

## 2023-05-27 DIAGNOSIS — Z7901 Long term (current) use of anticoagulants: Secondary | ICD-10-CM | POA: Insufficient documentation

## 2023-05-27 DIAGNOSIS — N189 Chronic kidney disease, unspecified: Secondary | ICD-10-CM | POA: Insufficient documentation

## 2023-05-27 DIAGNOSIS — E1122 Type 2 diabetes mellitus with diabetic chronic kidney disease: Secondary | ICD-10-CM | POA: Insufficient documentation

## 2023-05-27 DIAGNOSIS — I509 Heart failure, unspecified: Secondary | ICD-10-CM | POA: Diagnosis not present

## 2023-05-27 DIAGNOSIS — M722 Plantar fascial fibromatosis: Secondary | ICD-10-CM

## 2023-05-27 DIAGNOSIS — Z794 Long term (current) use of insulin: Secondary | ICD-10-CM | POA: Diagnosis not present

## 2023-05-27 DIAGNOSIS — M79671 Pain in right foot: Secondary | ICD-10-CM | POA: Diagnosis present

## 2023-05-27 MED ORDER — HYDROCODONE-ACETAMINOPHEN 5-325 MG PO TABS: 1.0000 | ORAL_TABLET | Freq: Four times a day (QID) | ORAL | 0 refills | Status: DC | PRN

## 2023-05-27 NOTE — ED Provider Notes (Signed)
[place] EMERGENCY DEPARTMENT AT [facility] Provider Note   CSN: [id number] Arrival date & time: 05/27/23  1025     History  Chief Complaint  Patient presents with   Foot Pain    [name] is a 82 y.o. male.  He has a history of diabetes, congestive heart failure, CKD.  He is complaining of right foot pain mostly in the arch that is been going on for 3 days.  Does not hurt at rest but does hurt if he tries to ambulate.  He denies any trauma.  Blood sugars have been running around 130s normal for patient.  He does have a podiatrist at the [place].  He has had gout in his other foot but it felt different.  The history is provided by the patient.  Foot Pain This is a new problem. The current episode started more than 2 days ago. The problem has not changed since onset.Pertinent negatives include no chest pain, no abdominal pain, no headaches and no shortness of breath. The symptoms are aggravated by walking. Nothing relieves the symptoms. He has tried rest for the symptoms. The treatment provided no relief.       Home Medications Prior to Admission medications   Medication Sig Start Date End Date Taking? Authorizing Provider  acetaminophen (TYLENOL) 650 MG CR tablet Take 1,300 mg by mouth every 8 (eight) hours as needed for pain.    [provider]  albuterol (VENTOLIN HFA) 108 (90 Base) MCG/ACT inhaler Inhale 1-2 puffs into the lungs every 6 (six) hours as needed for shortness of breath. 03/23/22   [provider]  amLODipine (NORVASC) 10 MG tablet Take 1 tablet (10 mg total) by mouth daily. 07/29/22   Johnson, Clanford L, MD  apixaban (ELIQUIS) 5 MG TABS tablet Take 1 tablet (5 mg total) by mouth 2 (two) times daily. 04/28/22   Jonelle Sidle, MD  atorvastatin (LIPITOR) 80 MG tablet Take 40 mg by mouth every evening.    [provider]  carboxymethylcellulose (REFRESH PLUS) 0.5 % SOLN Apply to eye. 03/09/22   [provider]   Cyanocobalamin (VITAMIN B 12) 500 MCG TABS Take 1,000 mcg by mouth daily. 03/07/20   Salley Scarlet, MD  ezetimibe (ZETIA) 10 MG tablet Take 1 tablet (10 mg total) by mouth daily. 01/18/23   Jonelle Sidle, MD  glucose-Vitamin C 4-0.006 GM CHEW chewable tablet Chew 4 tablets by mouth as directed. CHEW FOUR TABLETS BY MOUTH AS DIRECTED BY YOUR PROVIDER (REPEAT EVERY 15 MINUTES IF BLOOD SUGAR LESS THAN 70) 03/09/22   [provider]  insulin aspart protamine- aspart (NOVOLOG MIX 70/30) (70-30) 100 UNIT/ML injection Inject 0.4 mLs (40 Units total) into the skin 2 (two) times daily with a meal. 07/29/22   Johnson, Clanford L, MD  isosorbide mononitrate (IMDUR) 30 MG 24 hr tablet Take 1 tablet (30 mg total) by mouth every evening. 01/08/22 07/21/23  Jonelle Sidle, MD  metoprolol tartrate (LOPRESSOR) 50 MG tablet Take 25 mg by mouth 2 (two) times daily.    [provider]  mycophenolate (CELLCEPT) 500 MG tablet Take 2 tablets by mouth 2 (two) times daily. 05/21/22   [provider]  pioglitazone (ACTOS) 30 MG tablet Take 30 mg by mouth daily. 03/09/22   [provider]  Semaglutide (OZEMPIC, 0.25 OR 0.5 MG/DOSE, [place]) Inject 0.25 mg into the skin once a week. 02/26/22   [provider]  tamsulosin (FLOMAX) 0.4 MG CAPS capsule  Take 0.4 mg by mouth every evening.    [provider]  Tiotropium Bromide-Olodaterol 2.5-2.5 MCG/ACT AERS Inhale 2 puffs into the lungs daily. 03/23/22   [provider]  torsemide (DEMADEX) 20 MG tablet Take 40 mg by mouth daily.    [provider]  traMADol (ULTRAM) 50 MG tablet Take 1 tablet (50 mg total) by mouth every 6 (six) hours as needed. 01/31/23   Smitty Knudsen, PA-C  Vitamin D, Ergocalciferol, (DRISDOL) 1.25 MG (50000 UNIT) CAPS capsule Take 50,000 Units by mouth once a week. 06/02/22   [provider]      Allergies    Ivp dye [iodinated contrast media]    Review of Systems   Review of  Systems  Constitutional:  Negative for fever.  Respiratory:  Negative for shortness of breath.   Cardiovascular:  Negative for chest pain.  Gastrointestinal:  Negative for abdominal pain.  Musculoskeletal:  Positive for gait problem.  Skin:  Negative for wound.  Neurological:  Negative for headaches.    Physical Exam Updated Vital Signs BP (!) 144/67 (BP Location: Right Arm)   Pulse 63   Temp 98 F (36.7 C) (Oral)   Resp 18   SpO2 100%  Physical Exam Vitals and nursing note reviewed.  Constitutional:      Appearance: Normal appearance. He is well-developed.  HENT:     Head: Normocephalic and atraumatic.  Eyes:     Conjunctiva/sclera: Conjunctivae normal.  Pulmonary:     Effort: Pulmonary effort is normal.  Musculoskeletal:        General: Tenderness present. No deformity.     Cervical back: Neck supple.     Comments: Right lower extremity nontender knee and ankle.  He has some tenderness through his plantar arch.  No open wounds no erythema.  Toes are nontender.  He does have flatfeet and hallux deformity of great toe.  Distal pulses intact.  Skin:    General: Skin is warm and dry.  Neurological:     General: No focal deficit present.     Mental Status: He is alert.     GCS: GCS eye subscore is 4. GCS verbal subscore is 5. GCS motor subscore is 6.     ED Results / Procedures / Treatments   Labs (all labs ordered are listed, but only abnormal results are displayed) Labs Reviewed - No data to display  EKG None  Radiology DG Foot Complete Right  Result Date: 05/27/2023 CLINICAL DATA:  Pain EXAM: RIGHT FOOT COMPLETE - 3 VIEW COMPARISON:  None Available. FINDINGS: Soft tissue swelling about the foot. Diffuse vascular calcifications. 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 Associated degenerative changes of the first metatarsophalangeal joint. Moderate well corticated plantar calcaneal spur. No fracture or dislocation. 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 Soft tissue swelling with diffuse vascular calcifications. Electronically Signed   By: Karen Kays M.D.   On: 05/27/2023 12:27    Procedures Procedures    Medications Ordered in ED Medications - No data to display  ED Course/ Medical Decision Making/ A&P                             Medical Decision Making Amount and/or Complexity of Data Reviewed Radiology: ordered.  Risk Prescription drug management.   This patient complains of right foot pain; this involves an extensive number of treatment Options and is a complaint that  carries with it a high risk of complications and morbidity. The differential includes infection, arthritis, gout, foreign body I ordered imaging studies which included x-ray right foot and I independently    visualized and interpreted imaging which showed degenerative changes no definite foreign body Additional history obtained from patient's wife Previous records obtained and reviewed in epic no recent admissions Social determinants considered, no significant barriers Critical Interventions: None  After the interventions stated above, I reevaluated the patient and found patient was asymptomatic at rest Admission and further testing considered, no indications for admission or further workup at this time.  Due to his diabetes and CKD would not treat with steroids or NSAIDs.  He said he thinks Tylenol would be enough but we will also send a prescription for pain medicine if he needs it.  He understands to follow-up with podiatry and try to wear more supportive footwear.         Final Clinical Impression(s) / ED Diagnoses Final diagnoses:  Plantar fasciitis    Rx / DC Orders ED Discharge Orders          Ordered    HYDROcodone-acetaminophen (NORCO/VICODIN) 5-325 MG tablet  Every 6 hours PRN        05/27/23 1240              Terrilee Files, MD 05/27/23 1722

## 2023-05-27 NOTE — Discharge Instructions (Addendum)
You are seen in the emergency department for foot pain.  Your x-ray did not show any fracture or foreign body.  This is likely plantar fasciitis which is inflammation in the arch of the foot.  It possibly is related to footwear, make sure you are using something with good padding.  You can do Tylenol for pain.  For more significant pain we have sent a prescription for pain medication to the pharmacy.  Please use caution as this can make you constipated dizzy lightheaded.  Contact your podiatrist at the [facility] for close follow-up.[name]

## 2023-05-27 NOTE — ED Triage Notes (Signed)
Pt reports right foot pain x 3 days. Denies fevers and denies injury to the area.

## 2023-06-01 DIAGNOSIS — E1165 Type 2 diabetes mellitus with hyperglycemia: Secondary | ICD-10-CM | POA: Diagnosis not present

## 2023-06-22 ENCOUNTER — Telehealth: Payer: Self-pay | Admitting: Cardiology

## 2023-06-22 NOTE — Telephone Encounter (Signed)
For some unknown reason,patient continued medication and has now stopped since he was seen at the [place] yesterday.

## 2023-06-22 NOTE — Telephone Encounter (Signed)
I will forward to [place] for review.

## 2023-06-22 NOTE — Telephone Encounter (Signed)
Pt c/o medication issue:  1. Name of Medication: spironolacotane 25mg   2. How are you currently taking this medication (dosage and times per day)?   3. Are you having a reaction (difficulty breathing--STAT)?   4. What is your medication issue? [name] called to let us know patient has stopped taking this medication because he has some growth in his breast tissue and Dr. Alphonsa Gin in [place] advised him to stop taking it.  She said they told them the medication was probably causing the growth in the breast tissue.

## 2023-07-02 DIAGNOSIS — E1165 Type 2 diabetes mellitus with hyperglycemia: Secondary | ICD-10-CM | POA: Diagnosis not present

## 2023-07-08 DIAGNOSIS — L729 Follicular cyst of the skin and subcutaneous tissue, unspecified: Secondary | ICD-10-CM | POA: Diagnosis not present

## 2023-07-08 DIAGNOSIS — N183 Chronic kidney disease, stage 3 unspecified: Secondary | ICD-10-CM | POA: Diagnosis not present

## 2023-07-08 DIAGNOSIS — I10 Essential (primary) hypertension: Secondary | ICD-10-CM | POA: Diagnosis not present

## 2023-07-08 DIAGNOSIS — Z299 Encounter for prophylactic measures, unspecified: Secondary | ICD-10-CM | POA: Diagnosis not present

## 2023-07-08 DIAGNOSIS — E1165 Type 2 diabetes mellitus with hyperglycemia: Secondary | ICD-10-CM | POA: Diagnosis not present

## 2023-07-08 DIAGNOSIS — E1122 Type 2 diabetes mellitus with diabetic chronic kidney disease: Secondary | ICD-10-CM | POA: Diagnosis not present

## 2023-07-22 ENCOUNTER — Encounter: Payer: Self-pay | Admitting: Cardiology

## 2023-07-22 ENCOUNTER — Ambulatory Visit: Payer: Medicare Other | Attending: Cardiology | Admitting: Cardiology

## 2023-07-22 VITALS — BP 130/62 | HR 64 | Ht 68.0 in | Wt 271.0 lb

## 2023-07-22 DIAGNOSIS — N1832 Chronic kidney disease, stage 3b: Secondary | ICD-10-CM | POA: Diagnosis not present

## 2023-07-22 DIAGNOSIS — I480 Paroxysmal atrial fibrillation: Secondary | ICD-10-CM | POA: Diagnosis not present

## 2023-07-22 DIAGNOSIS — I5032 Chronic diastolic (congestive) heart failure: Secondary | ICD-10-CM

## 2023-07-22 MED ORDER — EZETIMIBE 10 MG PO TABS: 10.0000 mg | ORAL_TABLET | Freq: Every day | ORAL | 3 refills | Status: DC

## 2023-07-22 NOTE — Progress Notes (Signed)
Cardiology Office Note  Date: 07/22/2023   ID: [name], [name] Feb 21, 1941, MRN [id number]  History of Present Illness: [name] is an 82 y.o. male last seen in March.  He is here today with his wife for a follow-up visit.  He has had more fluid retention since last encounter, weight is up about 10 pounds.  Has had more prominent leg swelling.  We did discuss the salt in his diet and fluid intake.  Not entirely clear on his current diuretic regimen, did not bring in the bottle today but it sounds like he is getting Demadex 40 mg in the mornings.  We did stop Aldactone previously due to breast soreness and gynecomastia which has improved.  I reviewed his most recent lab work.  Zetia has not been initiated as yet although we did discuss that at last encounter.  Eliquis is now at 2.5 mg twice daily.  He does not report any spontaneous bleeding problems.  ECG today shows sinus rhythm with prolonged PR interval and poor R wave progression.  Physical Exam: VS:  BP 130/62   Pulse 64   Ht 5\' 8"  (1.727 m)   Wt 271 lb (122.9 kg)   SpO2 97%   BMI 41.21 kg/m , BMI Body mass index is 41.21 kg/m.  Wt Readings from Last 3 Encounters:  07/22/23 271 lb (122.9 kg)  01/18/23 263 lb 6.4 oz (119.5 kg)  01/17/23 (!) 350 lb (158.8 kg)    General: Patient appears comfortable at rest. HEENT: Conjunctiva and lids normal. Neck: Supple, no elevated JVP or carotid bruits. Lungs: Clear to auscultation, nonlabored breathing at rest. Cardiac: Regular rate and rhythm, no S3, 1/6 systolic murmur. Extremities: Chronic appearing edema, left greater than right as before.  ECG:  An ECG dated 07/10/2022 was personally reviewed today and demonstrated:  Sinus rhythm with left anterior fascicular block.  Labwork: 07/28/2022: Magnesium 2.1 01/31/2023: ALT 18; AST 19 05/04/2023: BUN 27; Creatinine, Ser 1.77; Hemoglobin 12.2; Platelets 172; Potassium 4.5; Sodium 133  November 2023: Cholesterol 133, triglycerides 84,  HDL 43, LDL 74 September 2024: Potassium 4.5, BUN 27, creatinine 1.62, AST 23, ALT 25, hemoglobin 12.3, platelets 205  Other Studies Reviewed Today:  Echocardiogram 01/08/2022:  1. Left ventricular ejection fraction, by estimation, is 55 to 60%. The  left ventricle has normal function. The left ventricle has no regional  wall motion abnormalities. There is moderate concentric left ventricular  hypertrophy. Left ventricular  diastolic parameters were normal.   2. RV-RA gradient 26 mmHg suggesting normal estimated RVSP in setting of  normal CVP. Right ventricular systolic function is normal. The right  ventricular size is normal.   3. Left atrial size was mildly dilated.   4. The mitral valve is grossly normal. Trivial mitral valve  regurgitation.   5. The aortic valve is tricuspid. There is mild calcification of the  aortic valve. Aortic valve regurgitation is not visualized. Aortic valve  sclerosis/calcification is present, without any evidence of aortic  stenosis.   6. Unable to estimate CVP.    Carotid Dopplers 07/28/2022: IMPRESSION: 1. Mild (1-49%) stenosis proximal right internal carotid artery secondary to heterogenous atherosclerotic plaque. 2. Mild (1-49%) stenosis proximal left internal carotid artery secondary to heterogenous atherosclerotic plaque. 3. Vertebral arteries are patent with normal antegrade flow.  Assessment and Plan:  1.  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  He has more fluid retention with increase in weight since last encounter as discussed above.  [name] will clarify Demadex dose currently at home.  If he is on 40 mg in the morning I would suggest going to 60 mg for 2 to 3 days to increase diuretic response and then cut back to prior dose.  He will likely need to adjust this based on weight and fluid retention.  Would not resume Aldactone in light of gynecomastia which has improved.  He is also on Ozempic.   2.  Paroxysmal atrial  fibrillation/flutter with CHA2DS2-VASc score of 6.  He does not report any palpitations at this time.  Eliquis has been cut to 2.5 mg twice daily based on age and renal function.  He does not report any spontaneous bleeding problems.  Continue Lopressor.   3.  Multivessel CAD status post CABG in 2007.  No active angina.  He is not on aspirin given use of Eliquis.  Continue Lipitor and Imdur.   4.  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  Recent creatinine 1.62.  5.  Hyperlipidemia, last LDL 74 in November 2023.  Continue Lipitor 80 mg daily and add Zetia 10 mg daily.  Disposition:  Follow up  6 months.  Signed, Jonelle Sidle, M.D., F.A.C.C. [facility]

## 2023-07-22 NOTE — Patient Instructions (Signed)
Medication Instructions:   Please call our office with Torsemide dose, [phone]  Take Zetia 10 mg daily for cholesterol- [facility]  Labwork: None today  Testing/Procedures: None   Follow-Up: 6 months  Any Other Special Instructions Will Be Listed Below (If Applicable).  If you need a refill on your cardiac medications before your next appointment, please call your pharmacy.

## 2023-07-25 ENCOUNTER — Telehealth: Payer: Self-pay | Admitting: Cardiology

## 2023-07-25 ENCOUNTER — Telehealth: Payer: Self-pay | Admitting: *Deleted

## 2023-07-25 MED ORDER — EZETIMIBE 10 MG PO TABS: 10.0000 mg | ORAL_TABLET | Freq: Every day | ORAL | 3 refills | Status: AC

## 2023-07-25 NOTE — Telephone Encounter (Signed)
Received a fax from [facility] stating that "This patient does not have authorization for community care. Please send prescription to an outside [facility] pharmacy". Wife informed and states she will call the [place].

## 2023-07-25 NOTE — Telephone Encounter (Signed)
Pt c/o medication issue:  1. Name of Medication: ezetimibe (ZETIA) 10 MG tablet   2. How are you currently taking this medication (dosage and times per day)? Not currently taking  3. Are you having a reaction (difficulty breathing--STAT)? No   4. What is your medication issue? Wife is calling stating this medication needs to be [facility] before it can be filled through the [place] pharmacy. She states his PCP with Dr. Alphonsa Gin. Please advise.

## 2023-07-25 NOTE — Telephone Encounter (Signed)
I spoke with wife and told her we do not need to go thru the pcp to fill his Zetia. I changed his pharmacy to the [place] and sent refill

## 2023-08-07 ENCOUNTER — Other Ambulatory Visit: Payer: Self-pay

## 2023-08-07 ENCOUNTER — Emergency Department (HOSPITAL_COMMUNITY)
Admission: EM | Admit: 2023-08-07 | Discharge: 2023-08-07 | Disposition: A | Discharge status: Home / Self Care | Payer: No Typology Code available for payment source

## 2023-08-07 ENCOUNTER — Emergency Department (HOSPITAL_COMMUNITY): Payer: No Typology Code available for payment source

## 2023-08-07 DIAGNOSIS — Z7901 Long term (current) use of anticoagulants: Secondary | ICD-10-CM | POA: Insufficient documentation

## 2023-08-07 DIAGNOSIS — R109 Unspecified abdominal pain: Secondary | ICD-10-CM | POA: Diagnosis present

## 2023-08-07 LAB — URINALYSIS, ROUTINE W REFLEX MICROSCOPIC
Bacteria, UA: NONE SEEN
Bilirubin Urine: NEGATIVE
Glucose, UA: NEGATIVE mg/dL
Ketones, ur: NEGATIVE mg/dL
Leukocytes,Ua: NEGATIVE
Nitrite: NEGATIVE
Protein, ur: 30 mg/dL — AB
Specific Gravity, Urine: 1.016 (ref 1.005–1.030)
pH: 5 (ref 5.0–8.0)

## 2023-08-07 LAB — BASIC METABOLIC PANEL
Anion gap: 9 (ref 5–15)
BUN: 31 mg/dL — ABNORMAL HIGH (ref 8–23)
CO2: 27 mmol/L (ref 22–32)
Calcium: 8.2 mg/dL — ABNORMAL LOW (ref 8.9–10.3)
Chloride: 98 mmol/L (ref 98–111)
Creatinine, Ser: 1.81 mg/dL — ABNORMAL HIGH (ref 0.61–1.24)
GFR, Estimated: 37 mL/min — ABNORMAL LOW (ref >60)
Glucose, Bld: 245 mg/dL — ABNORMAL HIGH (ref 70–99)
Potassium: 3.8 mmol/L (ref 3.5–5.1)
Sodium: 134 mmol/L — ABNORMAL LOW (ref 135–145)

## 2023-08-07 LAB — CBC
HCT: 38.1 % — ABNORMAL LOW (ref 39.0–52.0)
Hemoglobin: 11.7 g/dL — ABNORMAL LOW (ref 13.0–17.0)
MCH: 29.6 pg (ref 26.0–34.0)
MCHC: 30.7 g/dL (ref 30.0–36.0)
MCV: 96.5 fL (ref 80.0–100.0)
Platelets: 193 10*3/uL (ref 150–400)
RBC: 3.95 MIL/uL — ABNORMAL LOW (ref 4.22–5.81)
RDW: 12.7 % (ref 11.5–15.5)
WBC: 4.4 10*3/uL (ref 4.0–10.5)
nRBC: 0 % (ref 0.0–0.2)

## 2023-08-07 MED ORDER — LIDOCAINE 5 % EX PTCH
1.0000 | MEDICATED_PATCH | Freq: Once | CUTANEOUS | Status: DC
  Administered 2023-08-07: 1 via TRANSDERMAL
  Filled 2023-08-07: qty 1

## 2023-08-07 MED ORDER — ACETAMINOPHEN 500 MG PO TABS
1000.0000 mg | ORAL_TABLET | Freq: Once | ORAL | Status: AC
  Administered 2023-08-07: 1000 mg via ORAL
  Filled 2023-08-07: qty 2

## 2023-08-07 MED ORDER — LIDOCAINE 4 % EX PTCH: 1.0000 | MEDICATED_PATCH | CUTANEOUS | 0 refills | Status: AC

## 2023-08-07 NOTE — ED Notes (Signed)
Pt has right  sided back pain with no bruising. He says it hurts when he moves, no swelling or redness noted.

## 2023-08-07 NOTE — ED Notes (Signed)
Patient transported to CT 

## 2023-08-07 NOTE — Discharge Instructions (Signed)
Your CT scan had an incidental finding. Please follow up with your primary doctors   Findings:  Nonspecific central mesenteric haziness. There is also a loculated  area of fat stranding in the anterior left hemipelvis which was not  seen on the previous examination. This is of uncertain etiology.  Would recommend follow up in 3-6 months.

## 2023-08-07 NOTE — ED Triage Notes (Signed)
Pt c/o right flank pain since yesterday. No urinary issues but is also concerned about the fluid build up on bilateral legs x 3 days.

## 2023-08-07 NOTE — ED Provider Notes (Signed)
[facility]MENT AT [facility] Provider Note   CSN: [id number] Arrival date & time: 08/07/23  0846     History  Chief Complaint  Patient presents with   Flank Pain    [name] is a 82 y.o. male.  Is an 82 year old male presenting emergency department for right flank pain.  Has been off and on for the past several days.  Worsened last night into this morning.  Constant.  No fevers, chills, chest pain, shortness of breath.  No numbness tingling changes in sensation in lower extremities.  No bowel bladder incontinence.  No saddle anesthesia.  Has not had kidney stones in the past states this feels similar but somewhat different.  Worsens with movement, does not hurt at rest.   Flank Pain       Home Medications Prior to Admission medications   Medication Sig Start Date End Date Taking? Authorizing Provider  acetaminophen (TYLENOL) 650 MG CR tablet Take 1,300 mg by mouth every 8 (eight) hours as needed for pain.    [provider]  albuterol (VENTOLIN HFA) 108 (90 Base) MCG/ACT inhaler Inhale 1-2 puffs into the lungs every 6 (six) hours as needed for shortness of breath. 03/23/22   [provider]  amLODipine (NORVASC) 10 MG tablet Take 1 tablet (10 mg total) by mouth daily. 07/29/22   Johnson, Clanford L, MD  apixaban (ELIQUIS) 2.5 MG TABS tablet Take 2.5 mg by mouth 2 (two) times daily.    [provider]  atorvastatin (LIPITOR) 80 MG tablet Take 40 mg by mouth every evening.    [provider]  carboxymethylcellulose (REFRESH PLUS) 0.5 % SOLN Apply to eye. 03/09/22   [provider]  Cyanocobalamin (VITAMIN B 12) 500 MCG TABS Take 1,000 mcg by mouth daily. 03/07/20   Salley Scarlet, MD  ezetimibe (ZETIA) 10 MG tablet Take 1 tablet (10 mg total) by mouth daily. 07/25/23   Jonelle Sidle, MD  glucose-Vitamin C 4-0.006 GM CHEW chewable tablet Chew 4 tablets by mouth as directed. CHEW FOUR TABLETS BY MOUTH AS  DIRECTED BY YOUR PROVIDER (REPEAT EVERY 15 MINUTES IF BLOOD SUGAR LESS THAN 70) 03/09/22   [provider]  HYDROcodone-acetaminophen (NORCO/VICODIN) 5-325 MG tablet Take 1 tablet by mouth every 6 (six) hours as needed. 05/27/23   Terrilee Files, MD  insulin aspart protamine- aspart (NOVOLOG MIX 70/30) (70-30) 100 UNIT/ML injection Inject 0.4 mLs (40 Units total) into the skin 2 (two) times daily with a meal. 07/29/22   Johnson, Clanford L, MD  isosorbide mononitrate (IMDUR) 30 MG 24 hr tablet Take 1 tablet (30 mg total) by mouth every evening. 01/08/22 07/22/23  Jonelle Sidle, MD  metoprolol tartrate (LOPRESSOR) 50 MG tablet Take 25 mg by mouth 2 (two) times daily.    [provider]  mycophenolate (CELLCEPT) 500 MG tablet Take 2 tablets by mouth 2 (two) times daily. 05/21/22   [provider]  pioglitazone (ACTOS) 30 MG tablet Take 30 mg by mouth daily. 03/09/22   [provider]  Semaglutide (OZEMPIC, 0.25 OR 0.5 MG/DOSE, [place]) Inject 0.25 mg into the skin once a week. 02/26/22   [provider]  tamsulosin (FLOMAX) 0.4 MG CAPS capsule Take 0.4 mg by mouth every evening.    [provider]  Tiotropium Bromide-Olodaterol 2.5-2.5 MCG/ACT AERS Inhale 2 puffs into the lungs daily. 03/23/22   [provider]  torsemide (DEMADEX) 20 MG tablet Take 40 mg by mouth  daily.    [provider]  traMADol (ULTRAM) 50 MG tablet Take 1 tablet (50 mg total) by mouth every 6 (six) hours as needed. 01/31/23   Smitty Knudsen, PA-C  Vitamin D, Ergocalciferol, (DRISDOL) 1.25 MG (50000 UNIT) CAPS capsule Take 50,000 Units by mouth once a week. 06/02/22   [provider]      Allergies    Ivp dye [iodinated contrast media]    Review of Systems   Review of Systems  Genitourinary:  Positive for flank pain.    Physical Exam Updated Vital Signs BP (!) 157/56   Pulse (!) 55   Temp 97.8 F (36.6 C) (Oral)   Resp 18   Ht 5\' 8"  (1.727 m)   Wt  121.1 kg   SpO2 98%   BMI 40.60 kg/m  Physical Exam Vitals and nursing note reviewed.  HENT:     Head: Normocephalic.     Mouth/Throat:     Mouth: Mucous membranes are moist.  Eyes:     Conjunctiva/sclera: Conjunctivae normal.  Cardiovascular:     Rate and Rhythm: Normal rate and regular rhythm.  Pulmonary:     Effort: Pulmonary effort is normal.  Abdominal:     General: Abdomen is flat. There is no distension.     Tenderness: There is no abdominal tenderness. There is right CVA tenderness. There is no guarding or rebound.  Musculoskeletal:        General: Normal range of motion.  Skin:    General: Skin is warm and dry.     Capillary Refill: Capillary refill takes less than 2 seconds.  Neurological:     Mental Status: He is alert and oriented to person, place, and time.  Psychiatric:        Mood and Affect: Mood normal.        Behavior: Behavior normal.     ED Results / Procedures / Treatments   Labs (all labs ordered are listed, but only abnormal results are displayed) Labs Reviewed  URINALYSIS, ROUTINE W REFLEX MICROSCOPIC - Abnormal; Notable for the following components:      Result Value   Hgb urine dipstick SMALL (*)    Protein, ur 30 (*)    All other components within normal limits  CBC - Abnormal; Notable for the following components:   RBC 3.95 (*)    Hemoglobin 11.7 (*)    HCT 38.1 (*)    All other components within normal limits  BASIC METABOLIC PANEL - Abnormal; Notable for the following components:   Sodium 134 (*)    Glucose, Bld 245 (*)    BUN 31 (*)    Creatinine, Ser 1.81 (*)    Calcium 8.2 (*)    GFR, Estimated 37 (*)    All other components within normal limits    EKG None  Radiology CT Renal Stone Study  Result Date: 08/07/2023 CLINICAL DATA:  Right flank pain since yesterday. EXAM: CT ABDOMEN AND PELVIS WITHOUT CONTRAST TECHNIQUE: Multidetector CT imaging of the abdomen and pelvis was performed following the standard protocol without  IV contrast. RADIATION DOSE REDUCTION: This exam was performed according to the departmental dose-optimization program which includes automated exposure control, adjustment of the mA and/or kV according to patient size and/or use of iterative reconstruction technique. COMPARISON:  CT 02/26/2018 FINDINGS: Lower chest: Slight linear opacity lung bases likely scar or atelectasis. No pleural effusion. Coronary artery calcifications are seen. Hepatobiliary: Previous cholecystectomy. Preserved hepatic parenchyma on this noncontrast examination. Pancreas:  Unremarkable. No pancreatic ductal dilatation or surrounding inflammatory changes. Spleen: Choose 1 Adrenals/Urinary Tract: The adrenal glands are preserved. Mild bilateral renal atrophy. There are calcifications along the renal arteries in the hilum of each kidney. No renal collecting system calcifications. No ureteral stones. No collecting system dilatation. The ureters have normal course and caliber extending down to the bladder. There is a exophytic cyst from the upper pole of the left kidney which is unchanged from previous measuring up to 2.5 cm. Hounsfield unit of 9. No specific imaging follow-up. Preserved contours of the underdistended urinary bladder. Stomach/Bowel: On this non oral contrast exam, the stomach and small bowel are nondilated. Large bowel is of normal course and caliber. Scattered diffuse colonic stool. Left-sided colonic diverticula. Normal appendix in the right lower quadrant. Vascular/Lymphatic: Diffuse vascular calcifications along the aorta and branch vessels. Normal caliber aorta and IVC. Calcifications along the vas deferens. No specific abnormal lymph node enlargement identified in the abdomen and pelvis. Reproductive: Heterogeneous prostate. Other: Slight mesenteric haziness centrally, increased from 2019 but nonspecific. No free air or free fluid. Additional focal stranding along fat in the anterior left hemipelvis on series 2, image 65  measuring 4.2 cm. This has not seen on the prior. Musculoskeletal: Moderate degenerative changes of the spine and pelvis. Areas of some disc bulging. Schmorl's node deformities. IMPRESSION: Scattered colonic stool. Left-sided colonic diverticula. No obstruction. Normal appendix. No obstructing renal stones.  Mild bilateral renal atrophy. Nonspecific central mesenteric haziness. There is also a loculated area of fat stranding in the anterior left hemipelvis which was not seen on the previous examination. This is of uncertain etiology. Would recommend follow up in 3-6 months. Electronically Signed   By: Karen Kays M.D.   On: 08/07/2023 11:08    Procedures Procedures    Medications Ordered in ED Medications  lidocaine (LIDODERM) 5 % 1 patch (1 patch Transdermal Patch Applied 08/07/23 1103)  acetaminophen (TYLENOL) tablet 1,000 mg (1,000 mg Oral Given 08/07/23 1103)    ED Course/ Medical Decision Making/ A&P Clinical Course as of 08/07/23 1308  Sun Aug 07, 2023  3664 Per chart review last encounter was 07/22/2023 with cardiology.  Noted to have heart failure paroxysmal A-fib on Eliquis, CAD CKD hyperlipidemia.  Does not appear to have any CT scans of his abdomen. [TY]  [id number] Last echo per chart review on 01/08/22:1. Left ventricular ejection fraction, by estimation, is 55 to 60%. The  left ventricle has normal function. The left ventricle has no regional  wall motion abnormalities. There is moderate concentric left ventricular  hypertrophy. Left ventricular  diastolic parameters were normal.   2. RV-RA gradient 26 mmHg suggesting normal estimated RVSP in setting of  normal CVP. Right ventricular systolic function is normal. The right  ventricular size is normal.   3. Left atrial size was mildly dilated.   4. The mitral valve is grossly normal. Trivial mitral valve  regurgitation.   5. The aortic valve is tricuspid. There is mild calcification of the  aortic valve. Aortic valve regurgitation is  not visualized. Aortic valve  sclerosis/calcification is present, without any evidence of aortic  stenosis.   6. Unable to estimate CVP.   [TY]  1050 Basic metabolic panel(!) Appears creatinine mild elevation but largely at baseline.  Hyperglycemia noted, does not appear to be in DKA with no anion gap. [TY]  1050 CBC(!) No leukocytosis to suggest systemic infection.  Stable chronic anemia [TY]  1051 Urinalysis, Routine w reflex microscopic -Urine, Clean Catch(!) Not  consistent with UTI.  Does have hematuria. [TY]  1129 CT Renal Stone Study IMPRESSION: Scattered colonic stool. Left-sided colonic diverticula. No obstruction. Normal appendix.  No obstructing renal stones.  Mild bilateral renal atrophy.  Nonspecific central mesenteric haziness. There is also a loculated area of fat stranding in the anterior left hemipelvis which was not seen on the previous examination. This is of uncertain etiology. Would recommend follow up in 3-6 months.   [TY]  1306 Patient's workup largely reassuring as noted in ED course.  Reevaluated and he is asymptomatic and feeling much improved.  Distal point to musculoskeletal etiology.  Patient updated on stranding and haziness noted on CT scan and will follow-up with PCP.  Stable for discharge at this time [TY]    Clinical Course User Index [TY] Coral Spikes, DO                                 Medical Decision Making Well-appearing 82 year old with right flank pain.  Afebrile vital signs reassuring.  Does have some mild right CVA tenderness on exam.  Concern for stone versus obstruction versus malignancy.  Will get screening labs.  Given age will get CT scan.  See ED course for further MDM disposition.  Amount and/or Complexity of Data Reviewed Independent Historian:     Details: Notes patient was quite uncomfortable overnight. External Data Reviewed:     Details: See ED course Labs: ordered. Decision-making details documented in ED  Course. Radiology: ordered. Decision-making details documented in ED Course.  Risk OTC drugs. Prescription drug management.          Final Clinical Impression(s) / ED Diagnoses Final diagnoses:  None    Rx / DC Orders ED Discharge Orders     None         Coral Spikes, DO 08/07/23 1309

## 2023-09-08 ENCOUNTER — Ambulatory Visit: Payer: Medicare Other | Admitting: General Surgery

## 2023-10-27 ENCOUNTER — Emergency Department (HOSPITAL_COMMUNITY)
Admission: EM | Admit: 2023-10-27 | Discharge: 2023-10-27 | Disposition: A | Discharge status: Home / Self Care | Payer: No Typology Code available for payment source | Attending: Emergency Medicine | Admitting: Emergency Medicine

## 2023-10-27 ENCOUNTER — Encounter (HOSPITAL_COMMUNITY): Payer: Self-pay

## 2023-10-27 ENCOUNTER — Other Ambulatory Visit: Payer: Self-pay

## 2023-10-27 ENCOUNTER — Emergency Department (HOSPITAL_COMMUNITY): Payer: No Typology Code available for payment source

## 2023-10-27 DIAGNOSIS — M47812 Spondylosis without myelopathy or radiculopathy, cervical region: Secondary | ICD-10-CM

## 2023-10-27 DIAGNOSIS — M4802 Spinal stenosis, cervical region: Secondary | ICD-10-CM | POA: Diagnosis not present

## 2023-10-27 DIAGNOSIS — Z8739 Personal history of other diseases of the musculoskeletal system and connective tissue: Secondary | ICD-10-CM | POA: Insufficient documentation

## 2023-10-27 DIAGNOSIS — M47892 Other spondylosis, cervical region: Secondary | ICD-10-CM | POA: Diagnosis not present

## 2023-10-27 DIAGNOSIS — Z794 Long term (current) use of insulin: Secondary | ICD-10-CM | POA: Diagnosis not present

## 2023-10-27 DIAGNOSIS — I10 Essential (primary) hypertension: Secondary | ICD-10-CM | POA: Insufficient documentation

## 2023-10-27 DIAGNOSIS — R030 Elevated blood-pressure reading, without diagnosis of hypertension: Secondary | ICD-10-CM

## 2023-10-27 DIAGNOSIS — M4692 Unspecified inflammatory spondylopathy, cervical region: Secondary | ICD-10-CM | POA: Diagnosis not present

## 2023-10-27 DIAGNOSIS — S199XXA Unspecified injury of neck, initial encounter: Secondary | ICD-10-CM | POA: Diagnosis present

## 2023-10-27 DIAGNOSIS — X58XXXA Exposure to other specified factors, initial encounter: Secondary | ICD-10-CM | POA: Insufficient documentation

## 2023-10-27 DIAGNOSIS — Z7901 Long term (current) use of anticoagulants: Secondary | ICD-10-CM | POA: Insufficient documentation

## 2023-10-27 DIAGNOSIS — Z79899 Other long term (current) drug therapy: Secondary | ICD-10-CM | POA: Insufficient documentation

## 2023-10-27 DIAGNOSIS — S161XXA Strain of muscle, fascia and tendon at neck level, initial encounter: Secondary | ICD-10-CM | POA: Insufficient documentation

## 2023-10-27 DIAGNOSIS — M542 Cervicalgia: Secondary | ICD-10-CM | POA: Diagnosis present

## 2023-10-27 DIAGNOSIS — M5030 Other cervical disc degeneration, unspecified cervical region: Secondary | ICD-10-CM | POA: Diagnosis not present

## 2023-10-27 DIAGNOSIS — M436 Torticollis: Secondary | ICD-10-CM | POA: Diagnosis not present

## 2023-10-27 MED ORDER — METHOCARBAMOL 500 MG PO TABS: 500.0000 mg | ORAL_TABLET | Freq: Three times a day (TID) | ORAL | 0 refills | Status: DC | PRN

## 2023-10-27 MED ORDER — OXYCODONE-ACETAMINOPHEN 5-325 MG PO TABS: 1.0000 | ORAL_TABLET | Freq: Four times a day (QID) | ORAL | 0 refills | Status: DC | PRN

## 2023-10-27 MED ORDER — TRAMADOL HCL 50 MG PO TABS
50.0000 mg | ORAL_TABLET | Freq: Once | ORAL | Status: AC
  Administered 2023-10-27: 50 mg via ORAL
  Filled 2023-10-27: qty 1

## 2023-10-27 MED ORDER — OXYCODONE-ACETAMINOPHEN 5-325 MG PO TABS
1.0000 | ORAL_TABLET | Freq: Once | ORAL | Status: AC
  Administered 2023-10-27: 1 via ORAL
  Filled 2023-10-27: qty 1

## 2023-10-27 MED ORDER — TRAMADOL HCL 50 MG PO TABS: 50.0000 mg | ORAL_TABLET | Freq: Three times a day (TID) | ORAL | 0 refills | Status: DC | PRN

## 2023-10-27 NOTE — ED Provider Notes (Signed)
[facility] DE[facility]MENT AT [facility] Provider Note   CSN: [id number] Arrival date & time: 10/27/23  2018     History  Chief Complaint  Patient presents with   Neck Pain    [name] is a 82 y.o. male.  He is here with a complaint of posterior and lateral neck pain for over a month.  Worse with movement.  No known trauma.  No difficulty speaking or swallowing.  Not associated with any arm numbness or weakness.  He was seen here earlier today and given tramadol and a muscle relaxant without any improvement in his symptoms.  [facility] not [facility] for this.  No fevers or chills.  The history is provided by the patient and the spouse.  Neck Pain Pain location:  Occipital region, L side and R side Quality:  Aching Pain severity:  Severe Pain is:  Same all the time Onset quality:  Gradual Duration:  1 month Timing:  Constant Progression:  Worsening Chronicity:  New Relieved by:  Nothing Worsened by:  Bending and twisting Ineffective treatments:  NSAIDs and muscle relaxants Associated symptoms: no bladder incontinence, no bowel incontinence, no chest pain, no fever, no headaches, no numbness, no paresis, no tingling, no visual change and no weakness        Home Medications Prior to Admission medications   Medication Sig Start Date End Date Taking? Authorizing Provider  acetaminophen (TYLENOL) 650 MG CR tablet Take 1,300 mg by mouth every 8 (eight) hours as needed for pain.    [provider]  albuterol (VENTOLIN HFA) 108 (90 Base) MCG/ACT inhaler Inhale 1-2 puffs into the lungs every 6 (six) hours as needed for shortness of breath. 03/23/22   [provider]  amLODipine (NORVASC) 10 MG tablet Take 1 tablet (10 mg total) by mouth daily. 07/29/22   Johnson, Clanford L, MD  apixaban (ELIQUIS) 2.5 MG TABS tablet Take 2.5 mg by mouth 2 (two) times daily.    [provider]   atorvastatin (LIPITOR) 80 MG tablet Take 40 mg by mouth every evening.    [provider]  carboxymethylcellulose (REFRESH PLUS) 0.5 % SOLN Apply to eye. 03/09/22   [provider]  Cyanocobalamin (VITAMIN B 12) 500 MCG TABS Take 1,000 mcg by mouth daily. 03/07/20   Salley Scarlet, MD  ezetimibe (ZETIA) 10 MG tablet Take 1 tablet (10 mg total) by mouth daily. 07/25/23   Jonelle Sidle, MD  glucose-Vitamin C 4-0.006 GM CHEW chewable tablet Chew 4 tablets by mouth as directed. CHEW FOUR TABLETS BY MOUTH AS DIRECTED BY YOUR PROVIDER (REPEAT EVERY 15 MINUTES IF BLOOD SUGAR LESS THAN 70) 03/09/22   [provider]  HYDROcodone-acetaminophen (NORCO/VICODIN) 5-325 MG tablet Take 1 tablet by mouth every 6 (six) hours as needed. 05/27/23   Terrilee Files, MD  insulin aspart protamine- aspart (NOVOLOG MIX 70/30) (70-30) 100 UNIT/ML injection Inject 0.4 mLs (40 Units total) into the skin 2 (two) times daily with a meal. 07/29/22   Johnson, Clanford L, MD  isosorbide mononitrate (IMDUR) 30 MG 24 hr tablet Take 1 tablet (30 mg total) by mouth every evening. 01/08/22 07/22/23  Jonelle Sidle, MD  methocarbamol (ROBAXIN) 500 MG tablet Take 1 tablet (500 mg total) by mouth every 8 (eight) hours as needed (muscle spasm/pain). 10/27/23   Cathren Laine, MD  metoprolol tartrate (LOPRESSOR) 50 MG tablet Take 25 mg by mouth  2 (two) times daily.    [provider]  mycophenolate (CELLCEPT) 500 MG tablet Take 2 tablets by mouth 2 (two) times daily. 05/21/22   [provider]  pioglitazone (ACTOS) 30 MG tablet Take 30 mg by mouth daily. 03/09/22   [provider]  Semaglutide (OZEMPIC, 0.25 OR 0.5 MG/DOSE, [place]) Inject 0.25 mg into the skin once a week. 02/26/22   [provider]  tamsulosin (FLOMAX) 0.4 MG CAPS capsule Take 0.4 mg by mouth every evening.    [provider]  Tiotropium Bromide-Olodaterol 2.5-2.5 MCG/ACT AERS Inhale 2 puffs into the lungs  daily. 03/23/22   [provider]  torsemide (DEMADEX) 20 MG tablet Take 40 mg by mouth daily.    [provider]  traMADol (ULTRAM) 50 MG tablet Take 1 tablet (50 mg total) by mouth every 8 (eight) hours as needed. 10/27/23   Cathren Laine, MD  Vitamin D, Ergocalciferol, (DRISDOL) 1.25 MG (50000 UNIT) CAPS capsule Take 50,000 Units by mouth once a week. 06/02/22   [provider]      Allergies    Ivp dye [iodinated contrast media]    Review of Systems   Review of Systems  Constitutional:  Negative for fever.  Eyes:  Negative for visual disturbance.  Respiratory:  Negative for shortness of breath.   Cardiovascular:  Negative for chest pain.  Gastrointestinal:  Negative for abdominal pain and bowel incontinence.  Genitourinary:  Negative for bladder incontinence.  Musculoskeletal:  Positive for neck pain.  Neurological:  Negative for tingling, weakness, numbness and headaches.    Physical Exam Updated Vital Signs BP (!) 152/68 (BP Location: Right Arm)   Pulse 66   Temp 98.2 F (36.8 C) (Oral)   Resp 16   Ht 5\' 8"  (1.727 m)   Wt 120.2 kg   SpO2 100%   BMI 40.29 kg/m  Physical Exam Vitals and nursing note reviewed.  Constitutional:      General: He is not in acute distress.    Appearance: Normal appearance. He is well-developed.  HENT:     Head: Normocephalic and atraumatic.  Eyes:     Conjunctiva/sclera: Conjunctivae normal.  Cardiovascular:     Rate and Rhythm: Normal rate and regular rhythm.     Heart sounds: No murmur heard. Pulmonary:     Effort: Pulmonary effort is normal. No respiratory distress.     Breath sounds: Normal breath sounds.  Abdominal:     Palpations: Abdomen is soft.     Tenderness: There is no abdominal tenderness.  Musculoskeletal:        General: No deformity.     Cervical back: Neck supple.  Skin:    General: Skin is warm and dry.     Capillary Refill: Capillary refill takes less than 2 seconds.  Neurological:      General: No focal deficit present.     Mental Status: He is alert.     Sensory: No sensory deficit.     Motor: No weakness.     Gait: Gait normal.     ED Results / Procedures / Treatments   Labs (all labs ordered are listed, but only abnormal results are displayed) Labs Reviewed - No data to display  EKG None  Radiology CT Cervical Spine Wo Contrast Result Date: 10/27/2023 CLINICAL DATA:  Neck pain, chronic. EXAM: CT CERVICAL SPINE WITHOUT CONTRAST TECHNIQUE: Multidetector CT imaging of the cervical spine was performed without intravenous contrast. Multiplanar CT image reconstructions were also generated. RADIATION  DOSE REDUCTION: This exam was performed according to the departmental dose-optimization program which includes automated exposure control, adjustment of the mA and/or kV according to patient size and/or use of iterative reconstruction technique. COMPARISON:  01/31/2023 FINDINGS: Alignment: Normal Skull base and vertebrae: No acute fracture. No primary bone lesion or focal pathologic process. Soft tissues and spinal canal: No prevertebral fluid or swelling. No visible canal hematoma. Disc levels: Multilevel bilateral neural foraminal narrowing due to facet disease and uncovertebral spurring. No change since prior study. 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 No acute bony abnormality. Electronically Signed   By: Charlett Nose M.D.   On: 10/27/2023 22:22    Procedures Procedures    Medications Ordered in ED Medications  oxyCODONE-acetaminophen (PERCOCET/ROXICET) 5-325 MG per tablet 1 tablet (has no administration in time range)    ED Course/ Medical Decision Making/ A&P Clinical Course as of 10/28/23 1006  Thu Oct 27, 2023  2226 CT showing significant DJD.  Will review with patient and recommended close follow-up with his team at the [facility] [MB]    Clinical Course User Index [MB]  Terrilee Files, MD                                 Medical Decision Making Amount and/or Complexity of Data Reviewed Radiology: ordered.  Risk Prescription drug management.   This patient complains of worsening diffuse neck pain; this involves an extensive number of treatment Options and is a complaint that carries with it a high risk of complications and morbidity. The differential includes arthritis, musculoskeletal pain, mass, fracture, radiculopathy I ordered medication oral pain medicine and reviewed PMP when indicated. I ordered imaging studies which included CT cervical spine and I independently    visualized and interpreted imaging which showed significant degenerative changes Additional history obtained from patient's wife Previous records obtained and reviewed in epic including prior ED visit today Social determinants considered, depression Critical Interventions: None  After the interventions stated above, I reevaluated the patient and found neuro intact Admission and further testing considered, no indications for admission.  Will treat with pain medication and recommended follow-up with his medical team at the [place].  Return instructions discussed.         Final Clinical Impression(s) / ED Diagnoses Final diagnoses:  Osteoarthritis of cervical spine, unspecified spinal osteoarthritis complication status    Rx / DC Orders ED Discharge Orders          Ordered    oxyCODONE-acetaminophen (PERCOCET/ROXICET) 5-325 MG tablet  Every 6 hours PRN        10/27/23 2227    oxyCODONE-acetaminophen (PERCOCET/ROXICET) 5-325 MG tablet  Every 6 hours PRN        10/27/23 2231              Terrilee Files, MD 10/28/23 1007

## 2023-10-27 NOTE — ED Triage Notes (Signed)
Pt reports left side neck pain x 1 month.  Pt is not able to turn his head or move without sever pain.  Pt reports he has had this before but never this bad.

## 2023-10-27 NOTE — ED Provider Notes (Signed)
[place] EMERGENCY DEPARTMENT AT [facility] Provider Note   CSN: [id number] Arrival date & time: 10/27/23  1337     History  Chief Complaint  Patient presents with   Neck Pain    [name] is a 82 y.o. male.  Patient c/o pain/spasm to neck muscles left posterior/lateral neck. Notes hx same. Worse w certain neck movements. Current symptoms present in past month. Denies recent injury, trauma or fall. Has not taken anything for pain today. No radicular pain. No arm numbness/weakness. No chest pain or discomfort. No sob. No severe headaches. No fever or chills.   The history is provided by the patient, the spouse and medical records.  Neck Pain Associated symptoms: no chest pain, no fever, no headaches, no numbness and no weakness        Home Medications Prior to Admission medications   Medication Sig Start Date End Date Taking? Authorizing Provider  methocarbamol (ROBAXIN) 500 MG tablet Take 1 tablet (500 mg total) by mouth every 8 (eight) hours as needed (muscle spasm/pain). 10/27/23  Yes Cathren Laine, MD  traMADol (ULTRAM) 50 MG tablet Take 1 tablet (50 mg total) by mouth every 8 (eight) hours as needed. 10/27/23  Yes Cathren Laine, MD  acetaminophen (TYLENOL) 650 MG CR tablet Take 1,300 mg by mouth every 8 (eight) hours as needed for pain.    [provider]  albuterol (VENTOLIN HFA) 108 (90 Base) MCG/ACT inhaler Inhale 1-2 puffs into the lungs every 6 (six) hours as needed for shortness of breath. 03/23/22   [provider]  amLODipine (NORVASC) 10 MG tablet Take 1 tablet (10 mg total) by mouth daily. 07/29/22   Johnson, Clanford L, MD  apixaban (ELIQUIS) 2.5 MG TABS tablet Take 2.5 mg by mouth 2 (two) times daily.    [provider]  atorvastatin (LIPITOR) 80 MG tablet Take 40 mg by mouth every evening.    [provider]  carboxymethylcellulose (REFRESH PLUS) 0.5 % SOLN Apply to eye. 03/09/22   [provider]   Cyanocobalamin (VITAMIN B 12) 500 MCG TABS Take 1,000 mcg by mouth daily. 03/07/20   Salley Scarlet, MD  ezetimibe (ZETIA) 10 MG tablet Take 1 tablet (10 mg total) by mouth daily. 07/25/23   Jonelle Sidle, MD  glucose-Vitamin C 4-0.006 GM CHEW chewable tablet Chew 4 tablets by mouth as directed. CHEW FOUR TABLETS BY MOUTH AS DIRECTED BY YOUR PROVIDER (REPEAT EVERY 15 MINUTES IF BLOOD SUGAR LESS THAN 70) 03/09/22   [provider]  HYDROcodone-acetaminophen (NORCO/VICODIN) 5-325 MG tablet Take 1 tablet by mouth every 6 (six) hours as needed. 05/27/23   Terrilee Files, MD  insulin aspart protamine- aspart (NOVOLOG MIX 70/30) (70-30) 100 UNIT/ML injection Inject 0.4 mLs (40 Units total) into the skin 2 (two) times daily with a meal. 07/29/22   Johnson, Clanford L, MD  isosorbide mononitrate (IMDUR) 30 MG 24 hr tablet Take 1 tablet (30 mg total) by mouth every evening. 01/08/22 07/22/23  Jonelle Sidle, MD  metoprolol tartrate (LOPRESSOR) 50 MG tablet Take 25 mg by mouth 2 (two) times daily.    [provider]  mycophenolate (CELLCEPT) 500 MG tablet Take 2 tablets by mouth 2 (two) times daily. 05/21/22   [provider]  pioglitazone (ACTOS) 30 MG tablet Take 30 mg by mouth daily. 03/09/22   [provider]  Semaglutide (OZEMPIC, 0.25 OR 0.5 MG/DOSE, [place]) Inject 0.25 mg into the skin once a week. 02/26/22  [provider]  tamsulosin (FLOMAX) 0.4 MG CAPS capsule Take 0.4 mg by mouth every evening.    [provider]  Tiotropium Bromide-Olodaterol 2.5-2.5 MCG/ACT AERS Inhale 2 puffs into the lungs daily. 03/23/22   [provider]  torsemide (DEMADEX) 20 MG tablet Take 40 mg by mouth daily.    [provider]  Vitamin D, Ergocalciferol, (DRISDOL) 1.25 MG (50000 UNIT) CAPS capsule Take 50,000 Units by mouth once a week. 06/02/22   [provider]      Allergies    Ivp dye [iodinated contrast media]    Review of Systems    Review of Systems  Constitutional:  Negative for chills, diaphoresis and fever.  Respiratory:  Negative for shortness of breath.   Cardiovascular:  Negative for chest pain.  Gastrointestinal:  Negative for nausea and vomiting.  Musculoskeletal:  Positive for neck pain.  Skin:  Negative for rash.  Neurological:  Negative for speech difficulty, weakness, numbness and headaches.    Physical Exam Updated Vital Signs BP (!) 154/58   Pulse 62   Temp 98.2 F (36.8 C) (Oral)   Resp 16   Ht 1.727 m (5\' 8" )   Wt 120.2 kg   SpO2 100%   BMI 40.29 kg/m  Physical Exam Vitals and nursing note reviewed.  Constitutional:      Appearance: Normal appearance. He is well-developed.  HENT:     Head: Atraumatic.     Nose: Nose normal.     Mouth/Throat:     Mouth: Mucous membranes are moist.  Eyes:     General: No scleral icterus.    Conjunctiva/sclera: Conjunctivae normal.  Neck:     Trachea: No tracheal deviation.     Comments: Trachea midline. No neck mass. No l/a. Left posterior/lat muscular tenderness. No sts noted. No skin changes or lesions in area of pain.  Cardiovascular:     Rate and Rhythm: Normal rate and regular rhythm.     Pulses: Normal pulses.     Heart sounds: Normal heart sounds. No murmur heard.    No friction rub. No gallop.  Pulmonary:     Effort: Pulmonary effort is normal. No accessory muscle usage or respiratory distress.     Breath sounds: Normal breath sounds.  Abdominal:     General: There is no distension.     Tenderness: There is no abdominal tenderness.  Musculoskeletal:        General: No swelling.     Cervical back: Normal range of motion and neck supple. No rigidity.     Comments: C spine non tender, aligned. Left posterior/lat muscular tenderness. No sts. No erythema or infection noted.  No skin lesions or rash in area of pain. No crepitus.   Skin:    General: Skin is warm and dry.     Findings: No rash.  Neurological:     Mental Status: He is  alert.     Comments: Alert, speech clear. Motor/sens grossly intact bil. Bil upper ext stre 5/5. Sens grossly intact.   Psychiatric:        Mood and Affect: Mood normal.     ED Results / Procedures / Treatments   Labs (all labs ordered are listed, but only abnormal results are displayed) Labs Reviewed - No data to display  EKG None  Radiology No results found.  Procedures Procedures    Medications Ordered in ED Medications  traMADol (ULTRAM) tablet 50 mg (50 mg Oral Given 10/27/23 1615)  ED Course/ Medical Decision Making/ A&P                                 Medical Decision Making Problems Addressed: Acute strain of neck muscle, initial encounter: acute illness or injury Elevated blood pressure reading: acute illness or injury Essential hypertension: chronic illness or injury with exacerbation, progression, or side effects of treatment that poses a threat to life or bodily functions History of degenerative disc disease: chronic illness or injury that poses a threat to life or bodily functions Torticollis, acute: acute illness or injury with systemic symptoms  Amount and/or Complexity of Data Reviewed Independent Historian: spouse    Details: nx External Data Reviewed: radiology and notes. Radiology: independent interpretation performed. Decision-making details documented in ED Course.  Risk Prescription drug management.   Reviewed nursing notes and prior charts for additional history. 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   No meds pta. Ultram po.  Rx to pharmacy.   Rec pcp f/u.  Return precautions provided.           Final Clinical Impression(s) / ED Diagnoses Final diagnoses:  Acute strain of neck muscle, initial encounter  Torticollis, acute  Elevated blood pressure reading  Essential hypertension  History of degenerative disc disease    Rx / DC  Orders ED Discharge Orders          Ordered    traMADol (ULTRAM) 50 MG tablet  Every 8 hours PRN        10/27/23 1624    methocarbamol (ROBAXIN) 500 MG tablet  Every 8 hours PRN        10/27/23 1624              Cathren Laine, MD 10/27/23 1629

## 2023-10-27 NOTE — ED Triage Notes (Signed)
Pt just discharged from this facility a few hours ago for neck strain with tramadol and robaxin and he says the medicine is not helping.

## 2023-10-27 NOTE — Discharge Instructions (Signed)
You were seen in the emergency department for worsening neck pain.  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  Please stop the tramadol and you can use the oxycodone.  This has a lot of side effects that may make you tired dizzy nauseous and constipated.  Contact your doctors at the [facility] for close follow-up.  Return if any worsening or concerning symptoms

## 2023-10-27 NOTE — Discharge Instructions (Addendum)
It was our pleasure to provide your ER care today - we hope that you feel better.  Take acetaminophen as need for pain. You may also take ultram as need for pain, and robaxin as need for muscle pain/spasm - no driving for the next 6 hours, or when taking ultram or robaxin.  You may also try heating pad and/or gentle massage to sore area.   Follow up with primary care doctor in 1-2 weeks if symptoms fail to improve/resolve. Your blood pressure is mildly high today - follow up with primary care doctor in the next couple weeks.   Return to ER if worse, new symptoms, fevers, new/severe pain, swelling/redness to area, chest pain, trouble breathing, or other emergency concern.

## 2023-10-28 MED FILL — Oxycodone w/ Acetaminophen Tab 5-325 MG: ORAL | Qty: 6 | Status: AC

## 2023-12-20 ENCOUNTER — Other Ambulatory Visit: Payer: Self-pay

## 2023-12-20 ENCOUNTER — Emergency Department (HOSPITAL_COMMUNITY): Payer: No Typology Code available for payment source

## 2023-12-20 ENCOUNTER — Encounter (HOSPITAL_COMMUNITY): Payer: Self-pay

## 2023-12-20 ENCOUNTER — Emergency Department (HOSPITAL_COMMUNITY)
Admission: EM | Admit: 2023-12-20 | Discharge: 2023-12-20 | Disposition: A | Discharge status: Home / Self Care | Payer: No Typology Code available for payment source | Attending: Emergency Medicine | Admitting: Emergency Medicine

## 2023-12-20 ENCOUNTER — Emergency Department (HOSPITAL_COMMUNITY)
Admission: EM | Admit: 2023-12-20 | Discharge: 2023-12-20 | Disposition: A | Discharge status: Home / Self Care | Payer: No Typology Code available for payment source | Attending: Student | Admitting: Student

## 2023-12-20 DIAGNOSIS — Z8673 Personal history of transient ischemic attack (TIA), and cerebral infarction without residual deficits: Secondary | ICD-10-CM | POA: Insufficient documentation

## 2023-12-20 DIAGNOSIS — I2510 Atherosclerotic heart disease of native coronary artery without angina pectoris: Secondary | ICD-10-CM | POA: Diagnosis not present

## 2023-12-20 DIAGNOSIS — R0789 Other chest pain: Secondary | ICD-10-CM | POA: Insufficient documentation

## 2023-12-20 DIAGNOSIS — Z79899 Other long term (current) drug therapy: Secondary | ICD-10-CM | POA: Insufficient documentation

## 2023-12-20 DIAGNOSIS — Z7901 Long term (current) use of anticoagulants: Secondary | ICD-10-CM | POA: Insufficient documentation

## 2023-12-20 DIAGNOSIS — N189 Chronic kidney disease, unspecified: Secondary | ICD-10-CM | POA: Insufficient documentation

## 2023-12-20 DIAGNOSIS — Z794 Long term (current) use of insulin: Secondary | ICD-10-CM | POA: Diagnosis not present

## 2023-12-20 DIAGNOSIS — I130 Hypertensive heart and chronic kidney disease with heart failure and stage 1 through stage 4 chronic kidney disease, or unspecified chronic kidney disease: Secondary | ICD-10-CM | POA: Insufficient documentation

## 2023-12-20 DIAGNOSIS — E669 Obesity, unspecified: Secondary | ICD-10-CM | POA: Diagnosis not present

## 2023-12-20 DIAGNOSIS — E119 Type 2 diabetes mellitus without complications: Secondary | ICD-10-CM | POA: Insufficient documentation

## 2023-12-20 DIAGNOSIS — I129 Hypertensive chronic kidney disease with stage 1 through stage 4 chronic kidney disease, or unspecified chronic kidney disease: Secondary | ICD-10-CM | POA: Diagnosis not present

## 2023-12-20 DIAGNOSIS — E1122 Type 2 diabetes mellitus with diabetic chronic kidney disease: Secondary | ICD-10-CM | POA: Insufficient documentation

## 2023-12-20 DIAGNOSIS — Z87891 Personal history of nicotine dependence: Secondary | ICD-10-CM | POA: Insufficient documentation

## 2023-12-20 DIAGNOSIS — R079 Chest pain, unspecified: Secondary | ICD-10-CM | POA: Diagnosis present

## 2023-12-20 DIAGNOSIS — N184 Chronic kidney disease, stage 4 (severe): Secondary | ICD-10-CM | POA: Diagnosis not present

## 2023-12-20 DIAGNOSIS — Z6841 Body Mass Index (BMI) 40.0 and over, adult: Secondary | ICD-10-CM | POA: Diagnosis not present

## 2023-12-20 DIAGNOSIS — I5032 Chronic diastolic (congestive) heart failure: Secondary | ICD-10-CM | POA: Diagnosis not present

## 2023-12-20 LAB — COMPREHENSIVE METABOLIC PANEL
ALT: 24 U/L (ref 0–44)
AST: 22 U/L (ref 15–41)
Albumin: 3.5 g/dL (ref 3.5–5.0)
Alkaline Phosphatase: 53 U/L (ref 38–126)
Anion gap: 10 (ref 5–15)
BUN: 29 mg/dL — ABNORMAL HIGH (ref 8–23)
CO2: 24 mmol/L (ref 22–32)
Calcium: 8.8 mg/dL — ABNORMAL LOW (ref 8.9–10.3)
Chloride: 106 mmol/L (ref 98–111)
Creatinine, Ser: 1.92 mg/dL — ABNORMAL HIGH (ref 0.61–1.24)
GFR, Estimated: 35 mL/min — ABNORMAL LOW (ref >60)
Glucose, Bld: 84 mg/dL (ref 70–99)
Potassium: 4.4 mmol/L (ref 3.5–5.1)
Sodium: 140 mmol/L (ref 135–145)
Total Bilirubin: 0.7 mg/dL (ref 0.0–1.2)
Total Protein: 7.7 g/dL (ref 6.5–8.1)

## 2023-12-20 LAB — CBC WITH DIFFERENTIAL/PLATELET
Abs Immature Granulocytes: 0.02 10*3/uL (ref 0.00–0.07)
Basophils Absolute: 0 10*3/uL (ref 0.0–0.1)
Basophils Relative: 0 %
Eosinophils Absolute: 0.1 10*3/uL (ref 0.0–0.5)
Eosinophils Relative: 2 %
HCT: 38.9 % — ABNORMAL LOW (ref 39.0–52.0)
Hemoglobin: 12.4 g/dL — ABNORMAL LOW (ref 13.0–17.0)
Immature Granulocytes: 0 %
Lymphocytes Relative: 22 %
Lymphs Abs: 1.3 10*3/uL (ref 0.7–4.0)
MCH: 30.2 pg (ref 26.0–34.0)
MCHC: 31.9 g/dL (ref 30.0–36.0)
MCV: 94.6 fL (ref 80.0–100.0)
Monocytes Absolute: 0.8 10*3/uL (ref 0.1–1.0)
Monocytes Relative: 14 %
Neutro Abs: 3.6 10*3/uL (ref 1.7–7.7)
Neutrophils Relative %: 62 %
Platelets: 188 10*3/uL (ref 150–400)
RBC: 4.11 MIL/uL — ABNORMAL LOW (ref 4.22–5.81)
RDW: 13 % (ref 11.5–15.5)
WBC: 5.8 10*3/uL (ref 4.0–10.5)
nRBC: 0 % (ref 0.0–0.2)

## 2023-12-20 LAB — RESP PANEL BY RT-PCR (RSV, FLU A&B, COVID)  RVPGX2
Influenza A by PCR: NEGATIVE
Influenza B by PCR: NEGATIVE
Resp Syncytial Virus by PCR: NEGATIVE
SARS Coronavirus 2 by RT PCR: NEGATIVE

## 2023-12-20 LAB — TROPONIN I (HIGH SENSITIVITY)
Troponin I (High Sensitivity): 8 ng/L (ref <18)
Troponin I (High Sensitivity): 8 ng/L (ref <18)
Troponin I (High Sensitivity): 8 ng/L (ref <18)
Troponin I (High Sensitivity): 7 ng/L (ref <18)

## 2023-12-20 LAB — CBC
HCT: 39.9 % (ref 39.0–52.0)
Hemoglobin: 12.6 g/dL — ABNORMAL LOW (ref 13.0–17.0)
MCH: 30.1 pg (ref 26.0–34.0)
MCHC: 31.6 g/dL (ref 30.0–36.0)
MCV: 95.2 fL (ref 80.0–100.0)
Platelets: 181 10*3/uL (ref 150–400)
RBC: 4.19 MIL/uL — ABNORMAL LOW (ref 4.22–5.81)
RDW: 13.1 % (ref 11.5–15.5)
WBC: 5.2 10*3/uL (ref 4.0–10.5)
nRBC: 0 % (ref 0.0–0.2)

## 2023-12-20 LAB — LIPASE, BLOOD: Lipase: 36 U/L (ref 11–51)

## 2023-12-20 LAB — BASIC METABOLIC PANEL
Anion gap: 7 (ref 5–15)
BUN: 26 mg/dL — ABNORMAL HIGH (ref 8–23)
CO2: 25 mmol/L (ref 22–32)
Calcium: 8.6 mg/dL — ABNORMAL LOW (ref 8.9–10.3)
Chloride: 101 mmol/L (ref 98–111)
Creatinine, Ser: 1.76 mg/dL — ABNORMAL HIGH (ref 0.61–1.24)
GFR, Estimated: 39 mL/min — ABNORMAL LOW (ref >60)
Glucose, Bld: 274 mg/dL — ABNORMAL HIGH (ref 70–99)
Potassium: 4.5 mmol/L (ref 3.5–5.1)
Sodium: 133 mmol/L — ABNORMAL LOW (ref 135–145)

## 2023-12-20 LAB — BRAIN NATRIURETIC PEPTIDE: B Natriuretic Peptide: 45 pg/mL (ref 0.0–100.0)

## 2023-12-20 LAB — D-DIMER, QUANTITATIVE
D-Dimer, Quant: 0.56 ug{FEU}/mL — ABNORMAL HIGH (ref 0.00–0.50)
D-Dimer, Quant: 0.45 ug{FEU}/mL (ref 0.00–0.50)

## 2023-12-20 MED ORDER — LIDOCAINE VISCOUS HCL 2 % MT SOLN
15.0000 mL | Freq: Once | OROMUCOSAL | Status: AC
  Administered 2023-12-20: 15 mL via ORAL
  Filled 2023-12-20: qty 15

## 2023-12-20 MED ORDER — DICYCLOMINE HCL 10 MG PO CAPS: 20.0000 mg | ORAL_CAPSULE | Freq: Once | ORAL | Status: DC

## 2023-12-20 MED ORDER — ALUM & MAG HYDROXIDE-SIMETH 200-200-20 MG/5ML PO SUSP
30.0000 mL | Freq: Once | ORAL | Status: AC
  Administered 2023-12-20: 30 mL via ORAL
  Filled 2023-12-20: qty 30

## 2023-12-20 MED ORDER — ONDANSETRON HCL 4 MG/2ML IJ SOLN
4.0000 mg | Freq: Once | INTRAMUSCULAR | Status: DC
  Filled 2023-12-20: qty 2

## 2023-12-20 MED ORDER — MAALOX MAX 400-400-40 MG/5ML PO SUSP: 15.0000 mL | ORAL | 0 refills | Status: AC | PRN

## 2023-12-20 MED ORDER — FAMOTIDINE 20 MG PO TABS
20.0000 mg | ORAL_TABLET | Freq: Once | ORAL | Status: AC
  Administered 2023-12-20: 20 mg via ORAL
  Filled 2023-12-20: qty 1

## 2023-12-20 MED ORDER — MORPHINE SULFATE (PF) 4 MG/ML IV SOLN
4.0000 mg | Freq: Once | INTRAVENOUS | Status: DC
  Filled 2023-12-20: qty 1

## 2023-12-20 MED ORDER — PANTOPRAZOLE SODIUM 40 MG PO TBEC: 40.0000 mg | DELAYED_RELEASE_TABLET | Freq: Every day | ORAL | 0 refills | Status: DC

## 2023-12-20 NOTE — ED Notes (Signed)
 ED Provider at bedside.

## 2023-12-20 NOTE — ED Triage Notes (Signed)
Pt c/o centralized chest pain that started yesterday, denies any SOB but states he pain is worse when he takes a deep breath.

## 2023-12-20 NOTE — ED Notes (Signed)
Pt reports being seen here earlier this morning for same complaint, but pain did not improve.

## 2023-12-20 NOTE — ED Provider Notes (Signed)
[place] EMERGENCY DEPARTMENT AT [facility] Provider Note   CSN: [id number] Arrival date & time: 12/20/23  1422     History {Add pertinent medical, surgical, social history, OB history to HPI:1} Chief Complaint  Patient presents with   Chest Pain    [name] is a 83 y.o. male.  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  Pt was seen early this am for cp.  Cardiac eval neg and he was d/c home.  He went to sleep and woke up with the pain again.  Pt points to his epigastrium when he describes       Home Medications Prior to Admission medications   Medication Sig Start Date End Date Taking? Authorizing Provider  pantoprazole (PROTONIX) 40 MG tablet Take 1 tablet (40 mg total) by mouth daily. 12/20/23  Yes Jacalyn Lefevre, MD  acetaminophen (TYLENOL) 650 MG CR tablet Take 1,300 mg by mouth every 8 (eight) hours as needed for pain.    [provider]  albuterol (VENTOLIN HFA) 108 (90 Base) MCG/ACT inhaler Inhale 1-2 puffs into the lungs every 6 (six) hours as needed for shortness of breath. 03/23/22   [provider]  alum & mag hydroxide-simeth (MAALOX MAX) 400-400-40 MG/5ML suspension Take 15 mLs by mouth as needed for indigestion. 12/20/23   Kommor, [place], MD  amLODipine (NORVASC) 10 MG tablet Take 1 tablet (10 mg total) by mouth daily. 07/29/22   Johnson, Clanford L, MD  apixaban (ELIQUIS) 2.5 MG TABS tablet Take 2.5 mg by mouth 2 (two) times daily.    [provider]  atorvastatin (LIPITOR) 80 MG tablet Take 40 mg by mouth every evening.    [provider]  carboxymethylcellulose (REFRESH PLUS) 0.5 % SOLN Apply to eye. 03/09/22   [provider]  Cyanocobalamin (VITAMIN B 12) 500 MCG TABS Take 1,000 mcg by mouth daily. 03/07/20   Salley Scarlet, MD  ezetimibe (ZETIA) 10 MG tablet Take 1 tablet (10 mg total) by mouth daily. 07/25/23   Jonelle Sidle, MD  glucose-Vitamin C 4-0.006 GM CHEW chewable tablet Chew 4 tablets by mouth as directed. CHEW FOUR TABLETS BY MOUTH AS DIRECTED BY YOUR PROVIDER (REPEAT EVERY 15 MINUTES IF BLOOD SUGAR LESS THAN 70) 03/09/22   [provider]  HYDROcodone-acetaminophen (NORCO/VICODIN) 5-325 MG tablet Take 1 tablet by mouth every 6 (six) hours as needed. 05/27/23   Terrilee Files, MD  insulin aspart protamine- aspart (NOVOLOG MIX 70/30) (70-30) 100 UNIT/ML injection Inject 0.4 mLs (40 Units total) into the skin 2 (two) times daily with a meal. 07/29/22   Johnson, Clanford L, MD  isosorbide mononitrate (IMDUR) 30 MG 24 hr tablet Take 1 tablet (30 mg total) by mouth every evening. 01/08/22 07/22/23  Jonelle Sidle, MD  methocarbamol (ROBAXIN) 500 MG tablet Take 1 tablet (500 mg total) by mouth every 8 (eight) hours as needed (muscle spasm/pain). 10/27/23   Cathren Laine, MD  metoprolol tartrate (LOPRESSOR) 50 MG tablet Take 25 mg by mouth 2 (two) times daily.    [provider]  mycophenolate (CELLCEPT) 500 MG tablet Take 2 tablets by mouth 2 (two) times daily. 05/21/22   [provider]  oxyCODONE-acetaminophen (PERCOCET/ROXICET) 5-325 MG tablet Take 1 tablet by mouth every 6 (six) hours as needed for severe pain (pain score 7-10). 10/27/23   Terrilee Files, MD  oxyCODONE-acetaminophen (PERCOCET/ROXICET) [phone]  MG tablet Take 1 tablet by mouth every 6 (six) hours as needed for severe pain (pain score 7-10). 10/27/23   Terrilee Files, MD  pioglitazone (ACTOS) 30 MG tablet Take 30 mg by mouth daily. 03/09/22   [provider]  Semaglutide (OZEMPIC, 0.25 OR 0.5 MG/DOSE, [place]) Inject 0.25 mg into the skin once a week. 02/26/22   [provider]  tamsulosin (FLOMAX) 0.4 MG CAPS capsule Take 0.4 mg by mouth every evening.    [provider]  Tiotropium Bromide-Olodaterol 2.5-2.5 MCG/ACT AERS Inhale 2 puffs into the lungs daily. 03/23/22   [provider]   torsemide (DEMADEX) 20 MG tablet Take 40 mg by mouth daily.    [provider]  traMADol (ULTRAM) 50 MG tablet Take 1 tablet (50 mg total) by mouth every 8 (eight) hours as needed. 10/27/23   Cathren Laine, MD  Vitamin D, Ergocalciferol, (DRISDOL) 1.25 MG (50000 UNIT) CAPS capsule Take 50,000 Units by mouth once a week. 06/02/22   [provider]      Allergies    Ivp dye [iodinated contrast media]    Review of Systems   Review of Systems  Physical Exam Updated Vital Signs BP (!) 168/73   Pulse 66   Temp 99.1 F (37.3 C) (Oral)   Resp 16   Ht 5\' 8"  (1.727 m)   Wt 120.2 kg   SpO2 100%   BMI 40.29 kg/m  Physical Exam  ED Results / Procedures / Treatments   Labs (all labs ordered are listed, but only abnormal results are displayed) Labs Reviewed  CBC - Abnormal; Notable for the following components:      Result Value   RBC 4.19 (*)    Hemoglobin 12.6 (*)    All other components within normal limits  BASIC METABOLIC PANEL - Abnormal; Notable for the following components:   Sodium 133 (*)    Glucose, Bld 274 (*)    BUN 26 (*)    Creatinine, Ser 1.76 (*)    Calcium 8.6 (*)    GFR, Estimated 39 (*)    All other components within normal limits  D-DIMER, QUANTITATIVE  LIPASE, BLOOD  TROPONIN I (HIGH SENSITIVITY)  TROPONIN I (HIGH SENSITIVITY)    EKG None  Radiology CT CHEST ABDOMEN PELVIS WO CONTRAST Result Date: 12/20/2023 CLINICAL DATA:  Abdominal pain, acute, nonlocalized left lower chest pain. Pt reports pain is worse with respirations and movement. EXAM: CT CHEST, ABDOMEN AND PELVIS WITHOUT CONTRAST TECHNIQUE: Multidetector CT imaging of the chest, abdomen and pelvis was performed following the standard protocol without IV contrast. RADIATION DOSE REDUCTION: This exam was performed according to the departmental dose-optimization program which includes automated exposure control, adjustment of the mA and/or kV according to patient size and/or use of  iterative reconstruction technique. COMPARISON:  CT renal 08/07/2023 FINDINGS: CT CHEST FINDINGS Cardiovascular: Normal heart size. No significant pericardial effusion. The thoracic aorta is normal in caliber. At least mild atherosclerotic plaque of the thoracic aorta. Four-vessel coronary artery calcifications. Aortic valve leaflet calcification. Mitral annular calcification. Status post coronary artery bypass graft. Mediastinum/Nodes: No gross hilar adenopathy, noting limited sensitivity for the detection of hilar adenopathy on this noncontrast study. No enlarged mediastinal or axillary lymph nodes. Thyroid gland, trachea, and esophagus demonstrate no significant findings. Tiny hiatal hernia. Lungs/Pleura: No focal consolidation. No pulmonary nodule. No pulmonary mass. No pleural effusion. No pneumothorax. Musculoskeletal: No chest wall abnormality.  Bilateral gynecomastia. No suspicious lytic or blastic osseous lesions. No acute  displaced fracture. Chronic T12 anterior wedge compression fracture. Sternotomy wires. CT ABDOMEN PELVIS FINDINGS Hepatobiliary: No focal liver abnormality. Status post cholecystectomy. No biliary dilatation. Pancreas: No focal lesion. Normal pancreatic contour. No surrounding inflammatory changes. No main pancreatic ductal dilatation. Spleen: Normal in size without focal abnormality. Adrenals/Urinary Tract: No adrenal nodule bilaterally. No nephrolithiasis and no hydronephrosis. Fluid density lesion of the left kidney likely represents a simple renal cyst. Simple renal cysts, in the absence of clinically indicated signs/symptoms, require no independent follow-up. No ureterolithiasis or hydroureter. The urinary bladder is unremarkable. Stomach/Bowel: Stomach is within normal limits. No evidence of bowel wall thickening or dilatation. Few scattered colonic diverticula. Appendix appears normal. Vascular/Lymphatic: No abdominal aorta or iliac aneurysm. Severe atherosclerotic plaque of the  aorta and its branches. No abdominal, pelvic, or inguinal lymphadenopathy. Reproductive: Prostate is unremarkable. Other: Persistent mild haziness of the left abdomen small bowel mesentery. No intraperitoneal free fluid. No intraperitoneal free gas. No organized fluid collection. Musculoskeletal: Tiny fat containing umbilical hernia. No suspicious lytic or blastic osseous lesions. No acute displaced fracture. Multilevel degenerative changes of the spine. IMPRESSION: 1. Tiny hiatal hernia. 2. Colonic diverticulosis with no acute diverticulitis. 3. Aortic Atherosclerosis (ICD10-I70.0) including four-vessel coronary, mitral annular, aortic valve leaflet calcifications-correlate for aortic stenosis. Status post coronary bypass graft. Electronically Signed   By: Tish Frederickson M.D.   On: 12/20/2023 20:53   DG Chest Port 1 View Result Date: 12/20/2023 CLINICAL DATA:  Chest pain. EXAM: PORTABLE CHEST 1 VIEW COMPARISON:  Chest radiograph dated 12/20/2023. FINDINGS: No focal consolidation, pleural effusion, pneumothorax. The cardiac silhouette is within limits. Median sternotomy wires. No acute osseous pathology. IMPRESSION: No active disease. Electronically Signed   By: Elgie Collard M.D.   On: 12/20/2023 18:19   DG Chest 2 View Result Date: 12/20/2023 CLINICAL DATA:  Chest pain EXAM: CHEST - 2 VIEW COMPARISON:  07/10/2022 FINDINGS: The heart size and mediastinal contours are within normal limits. Both lungs are clear. The visualized skeletal structures are unremarkable. Remote median sternotomy. IMPRESSION: No active cardiopulmonary disease. Electronically Signed   By: Deatra Robinson M.D.   On: 12/20/2023 02:29    Procedures Procedures  {Document cardiac monitor, telemetry assessment procedure when appropriate:1}  Medications Ordered in ED Medications  alum & mag hydroxide-simeth (MAALOX/MYLANTA) 200-200-20 MG/5ML suspension 30 mL (30 mLs Oral Given 12/20/23 2134)  famotidine (PEPCID) tablet 20 mg (20 mg  Oral Given 12/20/23 2134)    ED Course/ Medical Decision Making/ A&P   {   Click here for ABCD2, HEART and other calculatorsREFRESH Note before signing :1}                              Medical Decision Making Amount and/or Complexity of Data Reviewed Labs: ordered. Radiology: ordered.  Risk OTC drugs. Prescription drug management.   ***  {Document critical care time when appropriate:1} {Document review of labs and clinical decision tools ie heart score, Chads2Vasc2 etc:1}  {Document your independent review of radiology images, and any outside records:1} {Document your discussion with family members, caretakers, and with consultants:1} {Document social determinants of health affecting pt's care:1} {Document your decision making why or why not admission, treatments were needed:1} Final Clinical Impression(s) / ED Diagnoses Final diagnoses:  Atypical chest pain    Rx / DC Orders ED Discharge Orders          Ordered    pantoprazole (PROTONIX) 40 MG tablet  Daily  12/20/23 2205            

## 2023-12-20 NOTE — ED Provider Notes (Signed)
[place] EMERGENCY DEPARTMENT AT [facility] Provider Note  CSN: [id number] Arrival date & time: 12/20/23 0134  Chief Complaint(s) Chest Pain  HPI [name] is a 83 y.o. male with PMH CKD 3, CAD status post CABG, GERD, HTN, HLD, paroxysmal A-fib on Eliquis, CVA who presents emergency department for evaluation of chest pain.  Patient states that for the last few days he has had pleuritic chest pain.  Worse when taking a deep breath or walking around.  Patient is only minimally ambulatory at baseline and needs a walker to get around.  Here in the emergency room, patient feels asymptomatic and is denying any current chest pain, shortness of breath, diaphoresis, nausea, vomiting or other systemic symptoms.  States that this evening his pain worsened prompting the initial visit to the ER today.   Past Medical History Past Medical History:  Diagnosis Date   Anemia    C. difficile diarrhea    Chest pain 06/07/2018   CKD (chronic kidney disease), stage III (HCC)    Coronary artery disease    Status post CABG in 2007 [facility])   Diabetes mellitus with stage 1 chronic kidney disease (HCC) 05/20/2011   Dyspnea 02/20/2018   GERD (gastroesophageal reflux disease)    Glaucoma    Hyperlipidemia    Hypertension    Morbid obesity (HCC)    PAF (paroxysmal atrial fibrillation) (HCC)    Peripheral vascular disease (HCC)    Sleep apnea    Stroke (HCC) 2008, 2014, 2015   Patient Active Problem List   Diagnosis Date Noted   Dizziness 07/27/2022   Nausea & vomiting 07/27/2022   Atrial fibrillation with rapid ventricular response (HCC) 05/02/2020   Anemia of chronic disease 03/07/2020   CKD stage 3 due to type 2 diabetes mellitus (HCC) 06/12/2019   Chest pain 06/07/2018   Hypoxia    Lactic acidosis 02/24/2018   Dyspnea 02/20/2018   BPH (benign prostatic hyperplasia) 02/20/2018   DDD (degenerative disc disease), lumbar 10/17/2017   Chronic diastolic heart  failure (HCC) 40/98/1191   Peripheral edema 01/22/2015   Tinea pedis 10/21/2014   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 11/27/2013   CKD (chronic kidney disease), stage IV (HCC) 11/21/2013   Left leg weakness 09/06/2013   Left-sided weakness 08/22/2013   OSA (obstructive sleep apnea) 04/12/2013   DM (diabetes mellitus) type II controlled with renal manifestation (HCC) 11/28/2012   PVD (peripheral vascular disease) (HCC) 11/28/2012   CAD (coronary artery disease) 11/28/2012   Mixed hyperlipidemia 11/28/2012   Obesity, Class III, BMI 40-49.9 (morbid obesity) (HCC) 11/28/2012   History of stroke 11/28/2012   OAB (overactive bladder) 11/28/2012   Essential hypertension 05/20/2011   Home Medication(s) Prior to Admission medications   Medication Sig Start Date End Date Taking? Authorizing Provider  acetaminophen (TYLENOL) 650 MG CR tablet Take 1,300 mg by mouth every 8 (eight) hours as needed for pain.    [provider]  albuterol (VENTOLIN HFA) 108 (90 Base) MCG/ACT inhaler Inhale 1-2 puffs into the lungs every 6 (six) hours as needed for shortness of breath. 03/23/22   [provider]  amLODipine (NORVASC) 10 MG tablet Take 1 tablet (10 mg total) by mouth daily. 07/29/22   Johnson, Clanford L, MD  apixaban (ELIQUIS) 2.5 MG TABS tablet Take 2.5 mg by mouth 2 (two) times daily.    [provider]  atorvastatin (LIPITOR) 80 MG tablet Take 40 mg by mouth every evening.  [provider]  carboxymethylcellulose (REFRESH PLUS) 0.5 % SOLN Apply to eye. 03/09/22   [provider]  Cyanocobalamin (VITAMIN B 12) 500 MCG TABS Take 1,000 mcg by mouth daily. 03/07/20   Salley Scarlet, MD  ezetimibe (ZETIA) 10 MG tablet Take 1 tablet (10 mg total) by mouth daily. 07/25/23   Jonelle Sidle, MD  glucose-Vitamin C 4-0.006 GM CHEW chewable tablet Chew 4 tablets by mouth as directed. CHEW FOUR TABLETS BY MOUTH AS DIRECTED BY YOUR PROVIDER  (REPEAT EVERY 15 MINUTES IF BLOOD SUGAR LESS THAN 70) 03/09/22   [provider]  HYDROcodone-acetaminophen (NORCO/VICODIN) 5-325 MG tablet Take 1 tablet by mouth every 6 (six) hours as needed. 05/27/23   Terrilee Files, MD  insulin aspart protamine- aspart (NOVOLOG MIX 70/30) (70-30) 100 UNIT/ML injection Inject 0.4 mLs (40 Units total) into the skin 2 (two) times daily with a meal. 07/29/22   Johnson, Clanford L, MD  isosorbide mononitrate (IMDUR) 30 MG 24 hr tablet Take 1 tablet (30 mg total) by mouth every evening. 01/08/22 07/22/23  Jonelle Sidle, MD  methocarbamol (ROBAXIN) 500 MG tablet Take 1 tablet (500 mg total) by mouth every 8 (eight) hours as needed (muscle spasm/pain). 10/27/23   Cathren Laine, MD  metoprolol tartrate (LOPRESSOR) 50 MG tablet Take 25 mg by mouth 2 (two) times daily.    [provider]  mycophenolate (CELLCEPT) 500 MG tablet Take 2 tablets by mouth 2 (two) times daily. 05/21/22   [provider]  oxyCODONE-acetaminophen (PERCOCET/ROXICET) 5-325 MG tablet Take 1 tablet by mouth every 6 (six) hours as needed for severe pain (pain score 7-10). 10/27/23   Terrilee Files, MD  oxyCODONE-acetaminophen (PERCOCET/ROXICET) 5-325 MG tablet Take 1 tablet by mouth every 6 (six) hours as needed for severe pain (pain score 7-10). 10/27/23   Terrilee Files, MD  pioglitazone (ACTOS) 30 MG tablet Take 30 mg by mouth daily. 03/09/22   [provider]  Semaglutide (OZEMPIC, 0.25 OR 0.5 MG/DOSE, [place]) Inject 0.25 mg into the skin once a week. 02/26/22   [provider]  tamsulosin (FLOMAX) 0.4 MG CAPS capsule Take 0.4 mg by mouth every evening.    [provider]  Tiotropium Bromide-Olodaterol 2.5-2.5 MCG/ACT AERS Inhale 2 puffs into the lungs daily. 03/23/22   [provider]  torsemide (DEMADEX) 20 MG tablet Take 40 mg by mouth daily.    [provider]  traMADol (ULTRAM) 50 MG tablet Take 1 tablet (50 mg total) by  mouth every 8 (eight) hours as needed. 10/27/23   Cathren Laine, MD  Vitamin D, Ergocalciferol, (DRISDOL) 1.25 MG (50000 UNIT) CAPS capsule Take 50,000 Units by mouth once a week. 06/02/22   [provider]  Past Surgical History Past Surgical History:  Procedure Laterality Date   CHOLECYSTECTOMY     CORONARY ARTERY BYPASS GRAFT     NO PAST SURGERIES     THYROID SURGERY     Family History Family History  Problem Relation Age of Onset   Diabetes Mother    Heart failure Mother    Hypertension Mother    Diabetes Father    Heart failure Father    Hypertension Father    Diabetes Sister    Heart failure Sister    Hypertension Sister    Hyperlipidemia Sister    Diabetes Brother    Heart failure Brother    Hypertension Brother     Social History Social History   Tobacco Use   Smoking status: Former    Current packs/day: 0.00    Average packs/day: 1 pack/day for 20.0 years (20.0 ttl pk-yrs)    Types: Cigarettes    Start date: 1963    Quit date: 1983    Years since quitting: 42.1   Smokeless tobacco: Never  Vaping Use   Vaping status: Never Used  Substance Use Topics   Alcohol use: No   Drug use: No   Allergies Ivp dye [iodinated contrast media]  Review of Systems Review of Systems  Cardiovascular:  Positive for chest pain.    Physical Exam Vital Signs  I have reviewed the triage vital signs BP 137/64   Pulse 63   Temp 98.8 F (37.1 C) (Oral)   Resp 18   SpO2 99%   Physical Exam Constitutional:      General: He is not in acute distress.    Appearance: Normal appearance.  HENT:     Head: Normocephalic and atraumatic.     Nose: No congestion or rhinorrhea.  Eyes:     General:        Right eye: No discharge.        Left eye: No discharge.     Extraocular Movements: Extraocular movements intact.     Pupils: Pupils are  equal, round, and reactive to light.  Cardiovascular:     Rate and Rhythm: Normal rate and regular rhythm.     Heart sounds: No murmur heard. Pulmonary:     Effort: No respiratory distress.     Breath sounds: No wheezing or rales.  Abdominal:     General: There is no distension.     Tenderness: There is no abdominal tenderness.  Musculoskeletal:        General: Normal range of motion.     Cervical back: Normal range of motion.  Skin:    General: Skin is warm and dry.  Neurological:     General: No focal deficit present.     Mental Status: He is alert.     ED Results and Treatments Labs (all labs ordered are listed, but only abnormal results are displayed) Labs Reviewed  COMPREHENSIVE METABOLIC PANEL - Abnormal; Notable for the following components:      Result Value   BUN 29 (*)    Creatinine, Ser 1.92 (*)    Calcium 8.8 (*)    GFR, Estimated 35 (*)    All other components within normal limits  CBC WITH DIFFERENTIAL/PLATELET - Abnormal; Notable for the following components:   RBC 4.11 (*)    Hemoglobin 12.4 (*)    HCT 38.9 (*)    All other components within normal limits  RESP PANEL BY RT-PCR (RSV, FLU A&B, COVID)  RVPGX2  BRAIN NATRIURETIC  PEPTIDE  D-DIMER, QUANTITATIVE  TROPONIN I (HIGH SENSITIVITY)  TROPONIN I (HIGH SENSITIVITY)                                                                                                                          Radiology DG Chest 2 View Result Date: 12/20/2023 CLINICAL DATA:  Chest pain EXAM: CHEST - 2 VIEW COMPARISON:  07/10/2022 FINDINGS: The heart size and mediastinal contours are within normal limits. Both lungs are clear. The visualized skeletal structures are unremarkable. Remote median sternotomy. IMPRESSION: No active cardiopulmonary disease. Electronically Signed   By: Deatra Robinson M.D.   On: 12/20/2023 02:29    Pertinent labs & imaging results that were available during my care of the patient were reviewed by me and  considered in my medical decision making (see MDM for details).  Medications Ordered in ED Medications - No data to display                                                                                                                                   Procedures Procedures  (including critical care time)  Medical Decision Making / ED Course   This patient presents to the ED for concern of chest pain, this involves an extensive number of treatment options, and is a complaint that carries with it a high risk of complications and morbidity.  The differential diagnosis includes ACS, Aortic Dissection, Pneumothorax, Pneumonia, Esophageal Rupture, PE, Tamponade/Pericardial Effusion, pericarditis, esophageal spasm, dysrhythmia, GERD, costochondritis.  MDM: Patient seen emergency room for evaluation of chest pain.  Physical exam largely unremarkable with no murmurs heard on cardiac exam, no wheezes or rales on lung exam.  Laboratory evaluation with a hemoglobin of 12.4, BUN 29, creatinine 1.92, high-sensitivity troponin and delta troponin both reassuringly negative, BNP normal, COVID, flu, RSV negative.  D-dimer is 0.56 which is negative by age-adjusted D-dimer and thus low suspicion for PE.  Chest x-ray reassuringly negative.  ECG without evidence of ischemia.  On reevaluation, patient states his symptoms have completely resolved and he is chest pain-free in the ER.  With 2 negative high-sensitivity troponins and symptoms resolved I have overall lower suspicion for ACS.  Suspect patient likely suffering from GERD or esophageal spasm and patient given GI cocktail with improvement of symptoms.  At this time with cardiac workup negative and symptoms resolved he does not meet inpatient criteria for admission and  will be discharged with outpatient follow-up.  Strict return precautions given of which he and his wife voiced understanding and outpatient cardiology referral placed.   Additional history  obtained: -Additional history obtained from wife -External records from outside source obtained and reviewed including: Chart review including previous notes, labs, imaging, consultation notes   Lab Tests: -I ordered, reviewed, and interpreted labs.   The pertinent results include:   Labs Reviewed  COMPREHENSIVE METABOLIC PANEL - Abnormal; Notable for the following components:      Result Value   BUN 29 (*)    Creatinine, Ser 1.92 (*)    Calcium 8.8 (*)    GFR, Estimated 35 (*)    All other components within normal limits  CBC WITH DIFFERENTIAL/PLATELET - Abnormal; Notable for the following components:   RBC 4.11 (*)    Hemoglobin 12.4 (*)    HCT 38.9 (*)    All other components within normal limits  RESP PANEL BY RT-PCR (RSV, FLU A&B, COVID)  RVPGX2  BRAIN NATRIURETIC PEPTIDE  D-DIMER, QUANTITATIVE  TROPONIN I (HIGH SENSITIVITY)  TROPONIN I (HIGH SENSITIVITY)      EKG   EKG Interpretation Date/Time:  Tuesday December 20 2023 01:50:01 EST Ventricular Rate:  63 PR Interval:  216 QRS Duration:  92 QT Interval:  384 QTC Calculation: 392 R Axis:   -62  Text Interpretation: Sinus rhythm with 1st degree A-V block Left axis deviation Incomplete right bundle branch block Anterior infarct (cited on or before 20-Dec-2023) Abnormal ECG When compared with ECG of 22-Jul-2023 08:46, No significant change was found Confirmed by Kalif Kattner ([id number]) on 12/20/2023 2:46:05 AM         Imaging Studies ordered: I ordered imaging studies including chest x-ray I independently visualized and interpreted imaging. I agree with the radiologist interpretation   Medicines ordered and prescription drug management: No orders of the defined types were placed in this encounter.   -I have reviewed the patients home medicines and have made adjustments as needed  Critical interventions none   Cardiac Monitoring: The patient was maintained on a cardiac monitor.  I personally viewed and  interpreted the cardiac monitored which showed an underlying rhythm of: NSR  Social Determinants of Health:  Factors impacting patients care include: none   Reevaluation: After the interventions noted above, I reevaluated the patient and found that they have :improved  Co morbidities that complicate the patient evaluation  Past Medical History:  Diagnosis Date   Anemia    C. difficile diarrhea    Chest pain 06/07/2018   CKD (chronic kidney disease), stage III (HCC)    Coronary artery disease    Status post CABG in 2007 [facility])   Diabetes mellitus with stage 1 chronic kidney disease (HCC) 05/20/2011   Dyspnea 02/20/2018   GERD (gastroesophageal reflux disease)    Glaucoma    Hyperlipidemia    Hypertension    Morbid obesity (HCC)    PAF (paroxysmal atrial fibrillation) (HCC)    Peripheral vascular disease (HCC)    Sleep apnea    Stroke (HCC) 2008, 2014, 2015      Dispostion: I considered admission for this patient, but at this time he does not meet inpatient criteria for admission and will be discharged with outpatient follow-up with strict return precautions     Final Clinical Impression(s) / ED Diagnoses Final diagnoses:  None     @PCDICTATION @    Gentry Pilson, Wyn Forster, MD 12/20/23 0630

## 2023-12-20 NOTE — ED Notes (Signed)
Attempted x 2 for IV access without success.  

## 2023-12-20 NOTE — Discharge Instructions (Signed)
Your pain may be from your stomach.  You will need to see a GI doctor.

## 2023-12-20 NOTE — ED Notes (Signed)
Pt left before paperwork could be given, stated that they could not wait for paperwork.

## 2023-12-20 NOTE — ED Triage Notes (Signed)
Pt arrived via POV c/o left lower chest pain. Pt reports pain is worse with respirations and movement.

## 2023-12-31 HISTORY — PX: CYST EXCISION: SHX5701

## 2024-01-12 ENCOUNTER — Telehealth: Payer: Self-pay | Admitting: Cardiology

## 2024-01-12 NOTE — Telephone Encounter (Signed)
 Pt c/o swelling/edema: STAT if pt has developed SOB within 24 hours  If swelling, where is the swelling located? Legs   How much weight have you gained and in what time span? 8lbs in a week   Have you gained 2 pounds in a day or 5 pounds in a week? 8lbs in a week  Do you have a log of your daily weights (if so, list)?  Last week 263-264 lbs  Today 271 lbs   Are you currently taking a fluid pill? Yes, she states they gave him extra   Are you currently SOB? Not right now but with exertion   Have you traveled recently in a car or plane for an extended period of time? No

## 2024-01-12 NOTE — Telephone Encounter (Signed)
 Spoke to pt's wife who verbalized understanding- will double Torsemide dose for 3 days. Pt has f/u scheduled for 3/25 with provider.

## 2024-01-12 NOTE — Telephone Encounter (Signed)
 Spoke to pt's wife who stated that pt's legs swell on and off but has gotten worse over the last 2-3 days. Wife stated that swelling is usually in his ankles, but has crept up to his knees this time. Pt is currently taking Torsemide 40 mg daily, but wife stated that she gave him an extra dose to help, but it did not. Pt's wife stated pt is still consuming salt, but has cut back tremendously. Pt gets sob with activity.   Please advise.

## 2024-01-17 ENCOUNTER — Encounter: Payer: Self-pay | Admitting: "Endocrinology

## 2024-01-17 ENCOUNTER — Ambulatory Visit (INDEPENDENT_AMBULATORY_CARE_PROVIDER_SITE_OTHER): Payer: Medicare Other | Admitting: "Endocrinology

## 2024-01-17 VITALS — BP 118/52 | HR 64 | Ht 68.0 in | Wt 272.8 lb

## 2024-01-17 DIAGNOSIS — N62 Hypertrophy of breast: Secondary | ICD-10-CM | POA: Insufficient documentation

## 2024-01-17 NOTE — Progress Notes (Unsigned)
 Endocrinology Consult Note                                            01/17/2024, 2:11 PM   Subjective:    Patient ID: [name], male    DOB: [birth year], PCP Marlowe Shores, MD   Past Medical History:  Diagnosis Date   Anemia    C. 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) 05/20/2011   Dyspnea 02/20/2018   GERD (gastroesophageal reflux disease)    Glaucoma    Hyperlipidemia    Hypertension    Morbid obesity (HCC)    PAF (paroxysmal atrial fibrillation) (HCC)    Peripheral vascular disease (HCC)    Sleep apnea    Stroke (HCC) 2008, 2014, 2015   Past Surgical History:  Procedure Laterality Date   CHOLECYSTECTOMY     CORONARY ARTERY BYPASS GRAFT     CYST EXCISION  12/2023   NO PAST SURGERIES     THYROID SURGERY     Social History   Socioeconomic History   Marital status: Married    Spouse name: Bonita Quin   Number of children: Not on file   Years of education: Not on file   Highest education level: Not on file  Occupational History   Not on file  Tobacco Use   Smoking status: Former    Current packs/day: 0.00    Average packs/day: 1 pack/day for 20.0 years (20.0 ttl pk-yrs)    Types: Cigarettes    Start date: 1963    Quit date: 15    Years since quitting: 42.2   Smokeless tobacco: Never  Vaping Use   Vaping status: Never Used  Substance and Sexual Activity   Alcohol use: No   Drug use: No   Sexual activity: Not Currently  Other Topics Concern   Not on file  Social History Narrative   1 child, 4 step-children    Social Drivers of Corporate investment banker Strain: Not on file  Food Insecurity: Not on file  Transportation Needs: Not on file  Physical Activity: Not on file  Stress: Not on file  Social Connections: Not on file    Family History  Problem Relation Age of Onset   Diabetes Mother    Heart failure Mother    Hypertension Mother    Diabetes Father    Heart failure Father    Hypertension Father    Diabetes Sister    Heart failure Sister    Hypertension Sister    Hyperlipidemia Sister    Diabetes Brother    Heart failure Brother    Hypertension Brother    Outpatient Encounter Medications as of 01/17/2024  Medication Sig   acetaminophen (TYLENOL) 650 MG CR tablet Take 1,300 mg by mouth every 8 (eight) hours as needed for pain.   albuterol (VENTOLIN HFA) 108 (90 Base) MCG/ACT inhaler Inhale 1-2 puffs into the lungs every 6 (six) hours as needed for shortness of breath.   alum & mag hydroxide-simeth (MAALOX MAX) 400-400-40 MG/5ML suspension Take 15 mLs by mouth as needed for indigestion.   amLODipine (NORVASC) 10 MG  tablet Take 1 tablet (10 mg total) by mouth daily.   apixaban (ELIQUIS) 2.5 MG TABS tablet Take 2.5 mg by mouth 2 (two) times daily.   atorvastatin (LIPITOR) 80 MG tablet Take 40 mg by mouth every evening.   carboxymethylcellulose (REFRESH PLUS) 0.5 % SOLN Apply to eye.   Cyanocobalamin (VITAMIN B 12) 500 MCG TABS Take 1,000 mcg by mouth daily.   ezetimibe (ZETIA) 10 MG tablet Take 1 tablet (10 mg total) by mouth daily.   glucose-Vitamin C 4-0.006 GM CHEW chewable tablet Chew 4 tablets by mouth as directed. CHEW FOUR TABLETS BY MOUTH AS DIRECTED BY YOUR PROVIDER (REPEAT EVERY 15 MINUTES IF BLOOD SUGAR LESS THAN 70)   HYDROcodone-acetaminophen (NORCO/VICODIN) 5-325 MG tablet Take 1 tablet by mouth every 6 (six) hours as needed.   insulin aspart protamine- aspart (NOVOLOG MIX 70/30) (70-30) 100 UNIT/ML injection Inject 0.4 mLs (40 Units total) into the skin 2 (two) times daily with a meal.   isosorbide mononitrate (IMDUR) 30 MG 24 hr tablet Take 1 tablet (30 mg total) by mouth every evening.   metoprolol tartrate (LOPRESSOR) 50 MG tablet Take 25 mg by mouth 2 (two) times daily.    mycophenolate (CELLCEPT) 500 MG tablet Take 2 tablets by mouth 2 (two) times daily.   oxyCODONE-acetaminophen (PERCOCET/ROXICET) 5-325 MG tablet Take 1 tablet by mouth every 6 (six) hours as needed for severe pain (pain score 7-10).   oxyCODONE-acetaminophen (PERCOCET/ROXICET) 5-325 MG tablet Take 1 tablet by mouth every 6 (six) hours as needed for severe pain (pain score 7-10).   pioglitazone (ACTOS) 30 MG tablet Take 30 mg by mouth daily.   Semaglutide (OZEMPIC, 0.25 OR 0.5 MG/DOSE, [place]) Inject 0.25 mg into the skin once a week.   tamsulosin (FLOMAX) 0.4 MG CAPS capsule Take 0.4 mg by mouth every evening.   torsemide (DEMADEX) 20 MG tablet Take 40 mg by mouth daily.   traMADol (ULTRAM) 50 MG tablet Take 1 tablet (50 mg total) by mouth every 8 (eight) hours as needed.   Vitamin D, Ergocalciferol, (DRISDOL) 1.25 MG (50000 UNIT) CAPS capsule Take 50,000 Units by mouth once a week.   [DISCONTINUED] methocarbamol (ROBAXIN) 500 MG tablet Take 1 tablet (500 mg total) by mouth every 8 (eight) hours as needed (muscle spasm/pain). (Patient not taking: Reported on 01/17/2024)   [DISCONTINUED] pantoprazole (PROTONIX) 40 MG tablet Take 1 tablet (40 mg total) by mouth daily. (Patient not taking: Reported on 01/17/2024)   [DISCONTINUED] Tiotropium Bromide-Olodaterol 2.5-2.5 MCG/ACT AERS Inhale 2 puffs into the lungs daily. (Patient not taking: Reported on 01/17/2024)   No facility-administered encounter medications on file as of 01/17/2024.   ALLERGIES: Allergies  Allergen Reactions   Benazepril    Lisinopril    Ivp Dye [Iodinated Contrast Media] Rash    VACCINATION STATUS: Immunization History  Administered Date(s) Administered   Fluad Quad(high Dose 65+) 07/10/2019, 07/08/2020   Influenza, High Dose Seasonal PF 08/10/2018   Influenza,inj,Quad PF,6+ Mos 08/13/2013, 08/08/2014, 07/11/2015, 07/19/2016   Moderna Covid-19 Fall Seasonal Vaccine 37yrs & older 08/16/2023   Moderna Sars-Covid-2 Vaccination  12/03/2019, 12/31/2019   Pneumococcal Conjugate-13 04/09/2015   Tdap 11/29/2011   Zoster, Live 11/28/2012    HPI [name] is 83 y.o. male who presents today with a medical history as above. he is being seen in consultation for gynecomastia requested by Marlowe Shores, MD.  History is obtained directly from the patient as well as his chart review.  Patient is accompanied by his wife  to clinic.  He reports that he did have significant enlargement of his right breast approximately a year ago when he was given spironolactone for management of hypertension.  After this medication was discontinued, there seem to be an apparent decrease in size of his breast enlargement.  He denies galactorrhea.  He did not have breast mammogram/ultrasound.  He does not have recent endocrine workup. He denies any prior history of testicular nor pituitary disorders.  His gonadal status is not known.  He has multiple medical problems including diabetes, hypertension, hyperlipidemia, coronary artery disease, CVA, CKD, CHF.  He is on multiple medications including tamsulosin, Ozempic, Actos, oxycodone, CellCept, metoprolol, NovoLog 70/30, ezetimibe, vitamin B12, atorvastatin, Eliquis, amlodipine, Maalox, albuterol, Tylenol.  He reports low libido.  He is not sexually active.  Review of Systems  Constitutional: +mildly fluctuating body weight ,  no fatigue, no subjective hyperthermia, no subjective hypothermia Eyes: no blurry vision, no xerophthalmia ENT: no sore throat, no nodules palpated in throat, no dysphagia/odynophagia, no hoarseness Cardiovascular: no Chest Pain, no Shortness of Breath, no palpitations, no leg swelling Respiratory: no cough, no shortness of breath Gastrointestinal: no Nausea/Vomiting/Diarhhea Musculoskeletal: no muscle/joint aches Skin: no rashes Neurological: no tremors, no numbness, no tingling, no dizziness Psychiatric: no depression, no anxiety  Objective:        01/17/2024    8:22 AM 12/20/2023    9:30 PM 12/20/2023    6:45 PM  Vitals with BMI  Height 5\' 8"     Weight 272 lbs 13 oz    BMI 41.49    Systolic 118 167 161  Diastolic 52 74 73  Pulse 64 60 66    BP (!) 118/52   Pulse 64   Ht 5\' 8"  (1.727 m)   Wt 272 lb 12.8 oz (123.7 kg)   BMI 41.48 kg/m   Wt Readings from Last 3 Encounters:  01/17/24 272 lb 12.8 oz (123.7 kg)  12/20/23 264 lb 15.9 oz (120.2 kg)  10/27/23 265 lb (120.2 kg)    Physical Exam  Constitutional:  Body mass index is 41.48 kg/m.,  not in acute distress, normal state of mind Eyes: PERRLA, EOMI, no exophthalmos ENT: moist mucous membranes, no gross thyromegaly, no gross cervical lymphadenopathy Cardiovascular: normal precordial activity, Regular Rate and Rhythm, no Murmur/Rubs/Gallops Respiratory:  adequate breathing efforts, no gross chest deformity, Clear to auscultation bilaterally Gastrointestinal: abdomen soft, Non -tender, No distension, Bowel Sounds present, no gross organomegaly Genital exam: Testicles are bilaterally shrunk to 5-8 cc, no intrascrotal mass lesion. Right breast slightly larger than left breast, soft-firm consistency, mobile subepidermal tissue. Musculoskeletal: no gross deformities, strength intact in all four extremities, no peripheral edema Skin: moist, warm, no rashes Neurological: no tremor with outstretched hands, Deep tendon reflexes normal in bilateral lower extremities.  CMP ( most recent) CMP     Component Value Date/Time   NA 133 (L) 12/20/2023 1904   K 4.5 12/20/2023 1904   CL 101 12/20/2023 1904   CO2 25 12/20/2023 1904   GLUCOSE 274 (H) 12/20/2023 1904   BUN 26 (H) 12/20/2023 1904   CREATININE 1.76 (H) 12/20/2023 1904   CREATININE 1.64 (H) 07/15/2020 1159   CALCIUM 8.6 (L) 12/20/2023 1904   CALCIUM 8.6 05/04/2023 0952   PROT 7.7 12/20/2023 0255   ALBUMIN 3.5 12/20/2023 0255   AST 22 12/20/2023 0255   ALT 24 12/20/2023 0255   ALKPHOS 53 12/20/2023 0255   BILITOT 0.7  12/20/2023 0255   GFRNONAA 39 (L) 12/20/2023 1904  GFRNONAA 40 (L) 07/15/2020 1159     Diabetic Labs (most recent): Lab Results  Component Value Date   HGBA1C 7.8 (H) 05/13/2022   HGBA1C 8.4 (H) 08/16/2020   HGBA1C 8.3 (H) 07/08/2020   MICROALBUR 1.8 04/09/2015   MICROALBUR 1.23 01/09/2013     Lipid Panel ( most recent) Lipid Panel     Component Value Date/Time   CHOL 128 08/16/2020 0513   TRIG 110 08/16/2020 0513   HDL 30 (L) 08/16/2020 0513   CHOLHDL 4.3 08/16/2020 0513   VLDL 22 08/16/2020 0513   LDLCALC 76 08/16/2020 0513   LDLCALC 98 03/07/2020 1158      Lab Results  Component Value Date   TSH 2.437 05/01/2020   TSH 1.039 02/25/2018   TSH 1.745 11/21/2013   TSH 0.727 ***Test methodology is 3rd generation TSH*** 11/23/2008           Assessment & Plan:   1. 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 - I have reviewed his available  records and clinically evaluated the patient. - Based on these reviews, he has mild asymmetric gynecomastia right greater than left,  however,  there is not sufficient information to proceed with definitive treatment plan.  Etiology seems to be multifactorial including unopposed estradiol versus spironolactone treatment. He will be sent for endocrine workup at baseline for the following: - Prolactin - PSA - Testosterone, Free, Total, SHBG - Luteinizing hormone - Follicle stimulating hormone - Estradiol - TSH - T4, free - HCG, Tumor Marker He will be considered for breast mammography after her next physical exam. He is advised to continue his other medications as prescribed. He is a suboptimal candidate for androgen replacement therapy even if he is confirmed to have hypogonadism.    - I did not initiate any new prescriptions today. - he is advised to maintain close follow up with Marlowe Shores, MD for primary care needs.   -Thank you for  involving me in the care of this pleasant patient.  Time spent with the patient: 45  minutes, of which >50% was spent in  counseling him about his gynecomastia and the rest in obtaining information about his symptoms, reviewing his previous labs/studies ( including abstractions from other facilities),  evaluations, and treatments,  and developing a plan to confirm diagnosis and long term treatment based on the latest standards of care/guidelines; and documenting his care.  [name] participated in the discussions, expressed understanding, and voiced agreement with the above plans.  All questions were answered to his satisfaction. he is encouraged to contact clinic should he have any questions or concerns prior to his return visit.  Follow up plan: Return in about 3 weeks (around 02/07/2024) for Fasting Labs  in AM B4 8.   Marquis Lunch, MD [facility] [address] [place], [place] [address] Phone: [phone]  Fax: [phone]     01/17/2024, 2:11 PM  This note was partially dictated with voice recognition software. Similar sounding words can be transcribed inadequately or may not  be corrected upon review.

## 2024-01-24 ENCOUNTER — Ambulatory Visit: Payer: Medicare Other | Attending: Cardiology | Admitting: Cardiology

## 2024-01-24 ENCOUNTER — Encounter: Payer: Self-pay | Admitting: Cardiology

## 2024-01-24 VITALS — BP 126/54 | HR 58 | Ht 68.0 in | Wt 270.0 lb

## 2024-01-24 DIAGNOSIS — N1832 Chronic kidney disease, stage 3b: Secondary | ICD-10-CM | POA: Insufficient documentation

## 2024-01-24 DIAGNOSIS — I480 Paroxysmal atrial fibrillation: Secondary | ICD-10-CM | POA: Insufficient documentation

## 2024-01-24 DIAGNOSIS — I25119 Atherosclerotic heart disease of native coronary artery with unspecified angina pectoris: Secondary | ICD-10-CM | POA: Insufficient documentation

## 2024-01-24 DIAGNOSIS — I5032 Chronic diastolic (congestive) heart failure: Secondary | ICD-10-CM | POA: Insufficient documentation

## 2024-01-24 NOTE — Progress Notes (Signed)
    Cardiology Office Note  Date: 01/24/2024   ID: [name], [name] 07-15-1941, MRN [id number]  History of Present Illness: [name] is an 83 y.o. male last seen in September 2024.  He is here today with his wife for a follow-up visit.  Continues to have trouble with fluctuating fluid retention.  Notices this mainly in his legs, left greater than right.  His weight trend is down in the last month by about 4 pounds.  I reviewed the interval chart.  We went over his medications.  Currently taking Demadex once daily in the morning (it is either a 20 mg or 40 mg dose, his wife was not certain today).  He did have improvement in fluid when we temporarily increased his diuretics after last visit.  I reviewed his interval lab work.  He continues to follow with nephrology.  Physical Exam: VS:  BP (!) 126/54   Pulse (!) 58   Ht 5\' 8"  (1.727 m)   Wt 270 lb (122.5 kg)   SpO2 98%   BMI 41.05 kg/m , BMI Body mass index is 41.05 kg/m.  Wt Readings from Last 3 Encounters:  01/24/24 270 lb (122.5 kg)  01/17/24 272 lb 12.8 oz (123.7 kg)  12/20/23 264 lb 15.9 oz (120.2 kg)    General: Patient appears comfortable at rest. HEENT: Conjunctiva and lids normal. Neck: Supple, no elevated JVP or carotid bruits. Lungs: Clear to auscultation, nonlabored breathing at rest. Cardiac: Regular rate and rhythm, no S3, 1/6 systolic murmur. Extremities: Chronic appearing edema, left greater than right.  ECG:  An ECG dated 12/20/2023 was personally reviewed today and demonstrated:  Sinus rhythm with left anterior fascicular block and decreased R wave progression.  Labwork: 12/20/2023: ALT 24; AST 22; B Natriuretic Peptide 45.0; BUN 26; Creatinine, Ser 1.76; Hemoglobin 12.6; Platelets 181; Potassium 4.5; Sodium 133  March 2025: BUN 26, creatinine 1.6, GFR 43, potassium 4.9, hemoglobin 12, platelets 203  Other Studies Reviewed Today:  No interval cardiac testing for review today.  Assessment and  Plan:  1.  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  Still having trouble with fluctuating fluid retention mainly manifested as leg edema.  Plan to double his baseline Demadex (his wife will call back with current pill dose which is either 20 mg or 40 mg).  He has a history of gynecomastia on Aldactone.  He is also on Ozempic.   2.  Paroxysmal atrial fibrillation/flutter with CHA2DS2-VASc score of 6.  He does not report any palpitations.  Continues on Eliquis 2.5 mg twice daily for stroke prophylaxis, no spontaneous bleeding problems reported.  I reviewed his interval lab work.   3.  Multivessel CAD status post CABG in 2007.  No angina at current level of activity.  He is not on aspirin given use of Eliquis.  Continue Imdur 30 mg daily, Lopressor 25 mg twice daily, and Lipitor along with Zetia.   4.  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  Recent creatinine 1.6 with GFR 43.   5.  Mixed hyperlipidemia, LDL 74 in November 2023.  Continue Lipitor 40 mg daily and Zetia 10 mg daily.  Disposition:  Follow up  3 months.  Signed, Jonelle Sidle, M.D., F.A.C.C. [facility]

## 2024-01-24 NOTE — Patient Instructions (Signed)
 Medication Instructions:   Double your home Demadex dose, it will either increase to 40 mg or 80 mg depending on what you have at home.Please call us to verify what dose you have at home. [phone]  Labwork: None today  Testing/Procedures: None today  Follow-Up: 3 months  Any Other Special Instructions Will Be Listed Below (If Applicable).  If you need a refill on your cardiac medications before your next appointment, please call your pharmacy.

## 2024-02-01 DIAGNOSIS — N62 Hypertrophy of breast: Secondary | ICD-10-CM | POA: Diagnosis not present

## 2024-02-05 LAB — ESTRADIOL: Estradiol: 47.4 pg/mL — ABNORMAL HIGH (ref 7.6–42.6)

## 2024-02-05 LAB — BETA HCG QUANT (REF LAB): hCG Quant: <1 m[IU]/mL (ref 0–3)

## 2024-02-05 LAB — FOLLICLE STIMULATING HORMONE: FSH: 10 m[IU]/mL (ref 1.5–12.4)

## 2024-02-05 LAB — TSH: TSH: 3.98 u[IU]/mL (ref 0.450–4.500)

## 2024-02-05 LAB — PSA: Prostate Specific Ag, Serum: 0.4 ng/mL (ref 0.0–4.0)

## 2024-02-05 LAB — TESTOSTERONE, FREE, TOTAL, SHBG
Sex Hormone Binding: 79.6 nmol/L — ABNORMAL HIGH (ref 19.3–76.4)
Testosterone, Free: 4.5 pg/mL — ABNORMAL LOW (ref 6.6–18.1)
Testosterone: 627 ng/dL (ref 264–916)

## 2024-02-05 LAB — T4, FREE: Free T4: 1.04 ng/dL (ref 0.82–1.77)

## 2024-02-05 LAB — LUTEINIZING HORMONE: LH: 8.6 m[IU]/mL (ref 1.7–8.6)

## 2024-02-05 LAB — PROLACTIN: Prolactin: 10.6 ng/mL (ref 3.6–25.2)

## 2024-02-08 ENCOUNTER — Ambulatory Visit: Admitting: "Endocrinology

## 2024-03-08 ENCOUNTER — Encounter: Payer: Self-pay | Admitting: "Endocrinology

## 2024-03-08 ENCOUNTER — Ambulatory Visit (INDEPENDENT_AMBULATORY_CARE_PROVIDER_SITE_OTHER): Admitting: "Endocrinology

## 2024-03-08 VITALS — BP 114/54 | HR 60

## 2024-03-08 DIAGNOSIS — N62 Hypertrophy of breast: Secondary | ICD-10-CM

## 2024-03-08 NOTE — Progress Notes (Signed)
 03/08/2024, 3:41 PM   Endocrinology follow-up note  Subjective:    Patient ID: [name], male    DOB: Sep 29, 1941, PCP Joanette Moynahan, MD   Past Medical History:  Diagnosis Date   Anemia    C. 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) 05/20/2011   Dyspnea 02/20/2018   GERD (gastroesophageal reflux disease)    Glaucoma    Hyperlipidemia    Hypertension    Morbid obesity (HCC)    PAF (paroxysmal atrial fibrillation) (HCC)    Peripheral vascular disease (HCC)    Sleep apnea    Stroke (HCC) 2008, 2014, 2015   Past Surgical History:  Procedure Laterality Date   CHOLECYSTECTOMY     CORONARY ARTERY BYPASS GRAFT     CYST EXCISION  12/2023   NO PAST SURGERIES     THYROID  SURGERY     Social History   Socioeconomic History   Marital status: Married    Spouse name: Stana Ear   Number of children: Not on file   Years of education: Not on file   Highest education level: Not on file  Occupational History   Not on file  Tobacco Use   Smoking status: Former    Current packs/day: 0.00    Average packs/day: 1 pack/day for 20.0 years (20.0 ttl pk-yrs)    Types: Cigarettes    Start date: 1963    Quit date: 61    Years since quitting: 42.3   Smokeless tobacco: Never  Vaping Use   Vaping status: Never Used  Substance and Sexual Activity   Alcohol  use: No   Drug use: No   Sexual activity: Not Currently  Other Topics Concern   Not on file  Social History Narrative   1 child, 4 step-children    Social Drivers of Corporate investment banker Strain: Not on file  Food Insecurity: Not on file  Transportation Needs: Not on file  Physical Activity: Not on file  Stress: Not on file  Social Connections: Not on file    Family History  Problem Relation Age of Onset   Diabetes Mother    Heart failure Mother    Hypertension Mother    Diabetes Father    Heart failure Father    Hypertension Father    Diabetes Sister    Heart failure Sister    Hypertension Sister    Hyperlipidemia Sister    Diabetes Brother    Heart failure Brother    Hypertension Brother    Outpatient Encounter Medications as of 03/08/2024  Medication Sig   acetaminophen  (TYLENOL ) 650 MG CR tablet Take 1,300 mg by mouth every 8 (eight) hours as needed for pain.   albuterol  (VENTOLIN  HFA) 108 (90 Base) MCG/ACT inhaler Inhale 1-2 puffs into the lungs every 6 (six) hours as needed for shortness of breath.   alum & mag hydroxide-simeth (MAALOX MAX) 400-400-40 MG/5ML suspension Take 15 mLs by mouth as needed for indigestion.   amLODipine  (NORVASC ) 10 MG tablet Take 1  tablet (10 mg total) by mouth daily.   apixaban  (ELIQUIS ) 2.5 MG TABS tablet Take 2.5 mg by mouth 2 (two) times daily.   atorvastatin  (LIPITOR) 80 MG tablet Take 40 mg by mouth every evening.   carboxymethylcellulose (REFRESH PLUS) 0.5 % SOLN Apply to eye.   Cyanocobalamin  (VITAMIN B 12) 500 MCG TABS Take 1,000 mcg by mouth daily.   ezetimibe  (ZETIA ) 10 MG tablet Take 1 tablet (10 mg total) by mouth daily.   glucose-Vitamin C  4-0.006 GM CHEW chewable tablet Chew 4 tablets by mouth as directed. CHEW FOUR TABLETS BY MOUTH AS DIRECTED BY YOUR PROVIDER (REPEAT EVERY 15 MINUTES IF BLOOD SUGAR LESS THAN 70)   HYDROcodone -acetaminophen  (NORCO/VICODIN) 5-325 MG tablet Take 1 tablet by mouth every 6 (six) hours as needed.   insulin  aspart protamine- aspart (NOVOLOG  MIX 70/30) (70-30) 100 UNIT/ML injection Inject 0.4 mLs (40 Units total) into the skin 2 (two) times daily with a meal.   isosorbide  mononitrate (IMDUR ) 30 MG 24 hr tablet Take 1 tablet (30 mg total) by mouth every evening.   metoprolol  tartrate (LOPRESSOR ) 50 MG tablet Take 25 mg by mouth 2 (two) times daily.   mycophenolate   (CELLCEPT ) 500 MG tablet Take 2 tablets by mouth 2 (two) times daily.   oxyCODONE -acetaminophen  (PERCOCET/ROXICET) 5-325 MG tablet Take 1 tablet by mouth every 6 (six) hours as needed for severe pain (pain score 7-10).   oxyCODONE -acetaminophen  (PERCOCET/ROXICET) 5-325 MG tablet Take 1 tablet by mouth every 6 (six) hours as needed for severe pain (pain score 7-10).   pioglitazone  (ACTOS ) 30 MG tablet Take 30 mg by mouth daily.   Semaglutide (OZEMPIC, 0.25 OR 0.5 MG/DOSE, [place]) Inject 0.25 mg into the skin once a week.   tamsulosin  (FLOMAX ) 0.4 MG CAPS capsule Take 0.4 mg by mouth every evening.   torsemide  (DEMADEX ) 20 MG tablet Take 40 mg by mouth daily.   traMADol  (ULTRAM ) 50 MG tablet Take 1 tablet (50 mg total) by mouth every 8 (eight) hours as needed.   Vitamin D , Ergocalciferol , (DRISDOL) 1.25 MG (50000 UNIT) CAPS capsule Take 50,000 Units by mouth once a week.   No facility-administered encounter medications on file as of 03/08/2024.   ALLERGIES: Allergies  Allergen Reactions   Benazepril    Lisinopril    Ivp Dye [Iodinated Contrast Media] Rash    VACCINATION STATUS: Immunization History  Administered Date(s) Administered   Fluad Quad(high Dose 65+) 07/10/2019, 07/08/2020   Influenza, High Dose Seasonal PF 08/10/2018   Influenza,inj,Quad PF,6+ Mos 08/13/2013, 08/08/2014, 07/11/2015, 07/19/2016   Moderna Covid-19 Fall Seasonal Vaccine 27yrs & older 08/16/2023   Moderna Sars-Covid-2 Vaccination 12/03/2019, 12/31/2019   Pneumococcal Conjugate-13 04/09/2015   Tdap 11/29/2011   Zoster, Live 11/28/2012    HPI [name] is 83 y.o. male who presents today with a medical history as above. he is being seen in follow-up after he was seen in consultation for gynecomastia requested by Joanette Moynahan, MD.  -He is accompanied by his wife to clinic.  During last visit, he reported that the gynecomastia was basically decreasing after he was advised to stop spironolactone .  He did  not require any further intervention.  He was sent for lab work for baseline endocrinology workup.  He returns with normal prolactin, testosterone , LH and FSH, negative hCG, slightly above target estradiol  at 47.4 (normal 7.6-42.6 pg/mL). He denies galactorrhea.  He did not have breast mammogram/ultrasound.  He denies any prior history of testicular nor pituitary disorders.  He has  multiple medical problems including diabetes, hypertension, hyperlipidemia, coronary artery disease, CVA, CKD, CHF.  He is on multiple medications including tamsulosin , Ozempic, Actos , oxycodone , CellCept , metoprolol , NovoLog  70/30, ezetimibe , vitamin B12, atorvastatin , Eliquis , amlodipine , Maalox, albuterol , Tylenol .  He reports low libido.  He is not sexually active.  Review of Systems  Constitutional: +mildly fluctuating body weight ,  no fatigue, no subjective hyperthermia, no subjective hypothermia   Objective:       03/08/2024   10:01 AM 01/24/2024   11:41 AM 01/17/2024    8:22 AM  Vitals with BMI  Height  5\' 8"  5\' 8"   Weight  270 lbs 272 lbs 13 oz  BMI  41.06 41.49  Systolic 114 126 119  Diastolic 54 54 52  Pulse 60 58 64    BP (!) 114/54   Pulse 60   Wt Readings from Last 3 Encounters:  01/24/24 270 lb (122.5 kg)  01/17/24 272 lb 12.8 oz (123.7 kg)  12/20/23 264 lb 15.9 oz (120.2 kg)    Physical Exam  Constitutional:  There is no height or weight on file to calculate BMI.,  not in acute distress, normal state of mind Eyes: PERRLA, EOMI, no exophthalmos  Genital exam: Testicles are bilaterally shrunk to 5-8 cc, no intrascrotal mass lesion. Right breast slightly larger than left breast, soft-firm consistency, mobile subepidermal tissue. On exam today, similar findings, nontender.   CMP ( most recent) CMP     Component Value Date/Time   NA 133 (L) 12/20/2023 1904   K 4.5 12/20/2023 1904   CL 101 12/20/2023 1904   CO2 25 12/20/2023 1904   GLUCOSE 274 (H) 12/20/2023 1904   BUN 26 (H)  12/20/2023 1904   CREATININE 1.76 (H) 12/20/2023 1904   CREATININE 1.64 (H) 07/15/2020 1159   CALCIUM  8.6 (L) 12/20/2023 1904   CALCIUM  8.6 05/04/2023 0952   PROT 7.7 12/20/2023 0255   ALBUMIN 3.5 12/20/2023 0255   AST 22 12/20/2023 0255   ALT 24 12/20/2023 0255   ALKPHOS 53 12/20/2023 0255   BILITOT 0.7 12/20/2023 0255   GFRNONAA 39 (L) 12/20/2023 1904   GFRNONAA 40 (L) 07/15/2020 1159     Diabetic Labs (most recent): Lab Results  Component Value Date   HGBA1C 7.8 (H) 05/13/2022   HGBA1C 8.4 (H) 08/16/2020   HGBA1C 8.3 (H) 07/08/2020   MICROALBUR 1.8 04/09/2015   MICROALBUR 1.23 01/09/2013     Lipid Panel ( most recent) Lipid Panel     Component Value Date/Time   CHOL 128 08/16/2020 0513   TRIG 110 08/16/2020 0513   HDL 30 (L) 08/16/2020 0513   CHOLHDL 4.3 08/16/2020 0513   VLDL 22 08/16/2020 0513   LDLCALC 76 08/16/2020 0513   LDLCALC 98 03/07/2020 1158         Assessment & Plan:   1. 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 Etiology seems to be multifactorial including unopposed estradiol  action versus spironolactone  treatment. His labs did not provide any endocrine disturbance.  He does have normal testosterone , gonadotropins, negative hCG, normal thyroid  function test.  He will not need intervention at this time.  He will return in a year for physical exam, he will be considered for breast mammography after her next physical exam if necessary. He is advised to continue his other medications as prescribed.  - he is advised to maintain close follow up with Vaidya, Rakeshchandra S, MD for primary care needs.   I spent  21  minutes in the care of the patient today including review of labs from Thyroid  Function, CMP, and other relevant labs ; imaging/biopsy records (current and previous including abstractions from other facilities); face-to-face time discussing  his lab results and symptoms,  medications doses, his options of short and long term treatment based on the latest standards of care / guidelines;   and documenting the encounter.  [name]  participated in the discussions, expressed understanding, and voiced agreement with the above plans.  All questions were answered to his satisfaction. he is encouraged to contact clinic should he have any questions or concerns prior to his return visit.   Follow up plan: Return in about 1 year (around 03/08/2025), or For Physical Exam, for F/U with no Labs.   Kalvin Orf, MD [facility] [address] [place], [place] [address] Phone: [phone]  Fax: [phone]     03/08/2024, 3:41 PM  This note was partially dictated with voice recognition software. Similar sounding words can be transcribed inadequately or may not  be corrected upon review.

## 2024-03-21 ENCOUNTER — Telehealth: Payer: Self-pay | Admitting: Cardiology

## 2024-03-21 MED ORDER — TORSEMIDE 20 MG PO TABS: 40.0000 mg | ORAL_TABLET | Freq: Every day | ORAL | 6 refills | Status: DC

## 2024-03-21 NOTE — Telephone Encounter (Signed)
Refill complete to [facility] in [place].

## 2024-03-21 NOTE — Telephone Encounter (Signed)
*  STAT* If patient is at the pharmacy, call can be transferred to refill team.   1. Which medications need to be refilled? (please list name of each medication and dose if known) torsemide  (DEMADEX ) 20 MG tablet    2. Would you like to learn more about the convenience, safety, & potential cost savings by using the [facility]?    3. Are you open to using the [facility] (Type [facility].  ).   4. Which pharmacy/location (including street and city if local pharmacy) is medication to be sent to? [facility] - [place], [place] - [address]    5. Do they need a 30 day or 90 day supply? 30 day

## 2024-04-19 DIAGNOSIS — N189 Chronic kidney disease, unspecified: Secondary | ICD-10-CM | POA: Diagnosis not present

## 2024-05-09 ENCOUNTER — Encounter: Payer: Self-pay | Admitting: Cardiology

## 2024-05-09 ENCOUNTER — Ambulatory Visit: Attending: Cardiology | Admitting: Cardiology

## 2024-05-09 VITALS — BP 128/58 | HR 60 | Ht 68.0 in | Wt 270.8 lb

## 2024-05-09 DIAGNOSIS — I25119 Atherosclerotic heart disease of native coronary artery with unspecified angina pectoris: Secondary | ICD-10-CM | POA: Insufficient documentation

## 2024-05-09 DIAGNOSIS — I480 Paroxysmal atrial fibrillation: Secondary | ICD-10-CM | POA: Diagnosis not present

## 2024-05-09 DIAGNOSIS — N1832 Chronic kidney disease, stage 3b: Secondary | ICD-10-CM | POA: Insufficient documentation

## 2024-05-09 DIAGNOSIS — I5032 Chronic diastolic (congestive) heart failure: Secondary | ICD-10-CM | POA: Insufficient documentation

## 2024-05-09 NOTE — Progress Notes (Signed)
    Cardiology Office Note  Date: 05/09/2024   ID: Ziair, Penson Nov 26, 1940, MRN [id number]  History of Present Illness: [name] is an 83 y.o. male last seen in March.  He is here with his wife for a follow-up visit.  [facility], has been prescribed home compression device for treatment of lymphedema which he stated started about a week ago.  He does not report any angina.  We went over his medications.  He has been on Demadex  40 mg daily.  Renal function has been relatively stable, lab work outlined below.  He does not report any spontaneous bleeding problems on Eliquis .  Physical Exam: VS:  BP (!) 128/58 (BP Location: Left Arm, Patient Position: Sitting, Cuff Size: Large)   Pulse 60   Ht 5' 8 (1.727 m)   Wt 270 lb 12.8 oz (122.8 kg)   SpO2 98%   BMI 41.17 kg/m , BMI Body mass index is 41.17 kg/m.  Wt Readings from Last 3 Encounters:  05/09/24 270 lb 12.8 oz (122.8 kg)  01/24/24 270 lb (122.5 kg)  01/17/24 272 lb 12.8 oz (123.7 kg)    General: Patient appears comfortable at rest. HEENT: Conjunctiva and lids normal. Neck: Supple, no elevated JVP or carotid bruits. Lungs: Clear to auscultation, nonlabored breathing at rest. Cardiac: RRR, 1/6 systolic murmur, no gallop. Extremities: Edema/lymphedema, somewhat better.  ECG:  An ECG dated 12/20/2023 was personally reviewed today and demonstrated:  Sinus rhythm with left anterior fascicular block and decreased R wave progression.  Labwork: 12/20/2023: ALT 24; AST 22; B Natriuretic Peptide 45.0; BUN 26; Creatinine, Ser 1.76; Hemoglobin 12.6; Platelets 181; Potassium 4.5; Sodium 133 02/01/2024: TSH 3.980  June 2025: BUN 23, creatinine 1.7, GFR 40, potassium 4.7  Other Studies Reviewed Today:  No interval cardiac testing for review today.  Assessment and Plan:  1.  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  Plan to continue Demadex  40 mg daily for now.  He has a history of gynecomastia on  Aldactone .  He is also on Ozempic.  2.  Lymphedema, now using mechanical compression at home following evaluation in the [facility] clinic.   3.  Paroxysmal atrial fibrillation/flutter with CHA2DS2-VASc score of 6.  No reported palpitations and heart rate regular today.  He continues on Eliquis  2.5 mg twice daily and Lopressor  25 mg twice daily.   4.  Multivessel CAD status post CABG in 2007.  No angina at current level of activity.  Continue Imdur  30 mg daily.   5.  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  Creatinine 1.7 with GFR 40 in June.   6.  Mixed hyperlipidemia.  LDL 74 in November 2023.  Continue Zetia  10 mg daily and Lipitor 40 mg daily.  Disposition:  Follow up 6 months.  Signed, Jayson JUDITHANN Sierras, M.D., F.A.C.C. [facility]

## 2024-05-09 NOTE — Patient Instructions (Signed)
 Medication Instructions:  Your physician recommends that you continue on your current medications as directed. Please refer to the Current Medication list given to you today.   Labwork: None today  Testing/Procedures: None today  Follow-Up: 6 months  Any Other Special Instructions Will Be Listed Below (If Applicable).  If you need a refill on your cardiac medications before your next appointment, please call your pharmacy.

## 2024-06-26 ENCOUNTER — Telehealth: Payer: Self-pay | Admitting: Cardiology

## 2024-06-26 ENCOUNTER — Encounter: Payer: Self-pay | Admitting: Internal Medicine

## 2024-06-26 NOTE — Telephone Encounter (Signed)
 Returned call to pt. No answer. Left msg to call back.

## 2024-06-26 NOTE — Telephone Encounter (Signed)
 Wife in office and states that pt's legs are swollen. The left is swollen more than the right. Wife complains that the pt hands are swollen. She had to take his wedding band off. Wife states that he is currently taking Torsemide  40 mg daily. The swelling started last Wed. Current weight is 270 lbs. Pt was at 262 lb on 06/16/24. Pt's wife states that his diet has not changed. The swelling does go down some at night but not completely. Pt has had some SOB when moving around. Pt does work wear support hose but has not been able to wear them d/t the swelling. [facility] d/t the swelling of his feet. Pt will elevate feet but not for long. Pt had labs done at the [facility] today. Please advise.

## 2024-06-26 NOTE — Telephone Encounter (Signed)
 Pt c/o swelling: STAT is pt has developed SOB within 24 hours  How much weight have you gained and in what time span? 5 to 6 lbs since beginning of last week   If swelling, where is the swelling located? Left leg, left ankle and foot as well   Are you currently taking a fluid pill? Yes  Are you currently SOB? No  Do you have a log of your daily weights (if so, list)? 270 this morning, but beginning of last week he was around 264  Have you gained 3 pounds in a day or 5 pounds in a week? Yes  Have you traveled recently? No

## 2024-06-26 NOTE — Telephone Encounter (Signed)
 Spoke with wife and she voiced understanding to increase Torsemide  to 40 mg two times daily until Friday, then resume 40 mg daily. Wife will call on Monday with update.

## 2024-07-03 ENCOUNTER — Telehealth: Payer: Self-pay | Admitting: Cardiology

## 2024-07-03 MED ORDER — TORSEMIDE 20 MG PO TABS: ORAL_TABLET | ORAL | 3 refills | Status: DC

## 2024-07-03 NOTE — Telephone Encounter (Signed)
 Pt c/o swelling: STAT is pt has developed SOB within 24 hours  How much weight have you gained and in what time span? 4 lbs since Saturday  If swelling, where is the swelling located? Stated the patient legs have went down,  Are you currently taking a fluid pill? yes  Are you currently SOB? yes  Do you have a log of your daily weights (if so, list)?    Have you gained 3 pounds in a day or 5 pounds in a week? yes  Have you traveled recently? no

## 2024-07-03 NOTE — Telephone Encounter (Signed)
 Wife notified of medication change and order placed.

## 2024-07-03 NOTE — Telephone Encounter (Signed)
 Spoke with wife and was informed that the swelling did go down mostly in the legs. Pt is SOB when moving around. His weight on Saturday was 263 lbs and is 267 on today. Please advise.

## 2024-08-22 DIAGNOSIS — D631 Anemia in chronic kidney disease: Secondary | ICD-10-CM | POA: Diagnosis not present

## 2024-08-22 DIAGNOSIS — E211 Secondary hyperparathyroidism, not elsewhere classified: Secondary | ICD-10-CM | POA: Diagnosis not present

## 2024-08-22 DIAGNOSIS — E559 Vitamin D deficiency, unspecified: Secondary | ICD-10-CM | POA: Diagnosis not present

## 2024-08-22 DIAGNOSIS — N189 Chronic kidney disease, unspecified: Secondary | ICD-10-CM | POA: Diagnosis not present

## 2024-08-22 DIAGNOSIS — R809 Proteinuria, unspecified: Secondary | ICD-10-CM | POA: Diagnosis not present

## 2024-09-13 ENCOUNTER — Telehealth: Payer: Self-pay | Admitting: Cardiology

## 2024-09-13 NOTE — Telephone Encounter (Signed)
 Pt c/o swelling/edema: STAT if pt has developed SOB within 24 hours  If swelling, where is the swelling located? Knees   How much weight have you gained and in what time span? 5-7 lbs within 5 days   Have you gained 2 pounds in a day or 5 pounds in a week? Yes   Do you have a log of your daily weights (if so, list)?  272 lbs - 11/13  265lbs  - couple days ago   Are you currently taking a fluid pill? Yes taking 3x daily   Are you currently SOB? Not currently, only with exertion   Have you traveled recently in a car or plane for an extended period of time? No

## 2024-09-13 NOTE — Telephone Encounter (Signed)
 Attempted to reach Dr.Bhutani's office, had to leave message

## 2024-09-13 NOTE — Telephone Encounter (Signed)
 Wife says Dr.Bhutani placed patient on losartan  25 mg daily last week to protect his kidneys. He had labs on 08/22/24 at [facility].  She confirmed he takes torsemide  60 mg daily alternating with 40 mg.  He denies CP but wife states he is wheezing and she cannot get him to use his inhaler Weight on 12/13/23 was 265 lbs, today 272 lbs.

## 2024-09-15 ENCOUNTER — Emergency Department (HOSPITAL_COMMUNITY)
Admission: EM | Admit: 2024-09-15 | Discharge: 2024-09-15 | Disposition: A | Discharge status: Home / Self Care | Attending: Emergency Medicine | Admitting: Emergency Medicine

## 2024-09-15 ENCOUNTER — Emergency Department (HOSPITAL_COMMUNITY)

## 2024-09-15 ENCOUNTER — Other Ambulatory Visit: Payer: Self-pay

## 2024-09-15 DIAGNOSIS — E1122 Type 2 diabetes mellitus with diabetic chronic kidney disease: Secondary | ICD-10-CM | POA: Diagnosis not present

## 2024-09-15 DIAGNOSIS — N189 Chronic kidney disease, unspecified: Secondary | ICD-10-CM | POA: Diagnosis not present

## 2024-09-15 DIAGNOSIS — Z7901 Long term (current) use of anticoagulants: Secondary | ICD-10-CM | POA: Diagnosis not present

## 2024-09-15 DIAGNOSIS — Z79899 Other long term (current) drug therapy: Secondary | ICD-10-CM | POA: Diagnosis not present

## 2024-09-15 DIAGNOSIS — I509 Heart failure, unspecified: Secondary | ICD-10-CM | POA: Insufficient documentation

## 2024-09-15 DIAGNOSIS — R635 Abnormal weight gain: Secondary | ICD-10-CM | POA: Insufficient documentation

## 2024-09-15 DIAGNOSIS — Z794 Long term (current) use of insulin: Secondary | ICD-10-CM | POA: Diagnosis not present

## 2024-09-15 DIAGNOSIS — R600 Localized edema: Secondary | ICD-10-CM | POA: Insufficient documentation

## 2024-09-15 DIAGNOSIS — I2510 Atherosclerotic heart disease of native coronary artery without angina pectoris: Secondary | ICD-10-CM | POA: Diagnosis not present

## 2024-09-15 DIAGNOSIS — Z951 Presence of aortocoronary bypass graft: Secondary | ICD-10-CM | POA: Diagnosis not present

## 2024-09-15 DIAGNOSIS — M7989 Other specified soft tissue disorders: Secondary | ICD-10-CM | POA: Diagnosis present

## 2024-09-15 LAB — BASIC METABOLIC PANEL WITH GFR
Anion gap: 12 (ref 5–15)
BUN: 27 mg/dL — ABNORMAL HIGH (ref 8–23)
CO2: 27 mmol/L (ref 22–32)
Calcium: 8.6 mg/dL — ABNORMAL LOW (ref 8.9–10.3)
Chloride: 100 mmol/L (ref 98–111)
Creatinine, Ser: 1.98 mg/dL — ABNORMAL HIGH (ref 0.61–1.24)
GFR, Estimated: 33 mL/min — ABNORMAL LOW (ref >60)
Glucose, Bld: 135 mg/dL — ABNORMAL HIGH (ref 70–99)
Potassium: 3.9 mmol/L (ref 3.5–5.1)
Sodium: 139 mmol/L (ref 135–145)

## 2024-09-15 LAB — CBC
HCT: 35.8 % — ABNORMAL LOW (ref 39.0–52.0)
Hemoglobin: 11.2 g/dL — ABNORMAL LOW (ref 13.0–17.0)
MCH: 30.4 pg (ref 26.0–34.0)
MCHC: 31.3 g/dL (ref 30.0–36.0)
MCV: 97 fL (ref 80.0–100.0)
Platelets: 178 K/uL (ref 150–400)
RBC: 3.69 MIL/uL — ABNORMAL LOW (ref 4.22–5.81)
RDW: 13.1 % (ref 11.5–15.5)
WBC: 4.6 K/uL (ref 4.0–10.5)
nRBC: 0 % (ref 0.0–0.2)

## 2024-09-15 LAB — PRO BRAIN NATRIURETIC PEPTIDE: Pro Brain Natriuretic Peptide: 334 pg/mL — ABNORMAL HIGH (ref <300.0)

## 2024-09-15 MED ORDER — POTASSIUM CHLORIDE CRYS ER 20 MEQ PO TBCR
20.0000 meq | EXTENDED_RELEASE_TABLET | Freq: Once | ORAL | Status: AC
  Administered 2024-09-15: 20 meq via ORAL
  Filled 2024-09-15: qty 1

## 2024-09-15 MED ORDER — FUROSEMIDE 40 MG PO TABS
80.0000 mg | ORAL_TABLET | Freq: Once | ORAL | Status: AC
  Administered 2024-09-15: 80 mg via ORAL
  Filled 2024-09-15: qty 2

## 2024-09-15 NOTE — ED Notes (Signed)
 Pt used a walker to ambulate from his room, down the hall and back. O2 stayed at 100% the entire time.

## 2024-09-15 NOTE — ED Provider Notes (Signed)
 [place] EMERGENCY DEPARTMENT AT [facility] Provider Note   CSN: [id number] Arrival date & time: 09/15/24  9147     Patient presents with: Leg Swelling   [name] is a 83 y.o. male.  He has history of CAD, HFpEF, status post CABG in 2007, type 2 diabetes, PAD.  Presents the ER today complaining of weight gain and lower extremity edema for the past several days.  He has gained about 6 weights and despite taking 60 mg of Lasix  daily as opposed to his 40 mg for the past 3 days his weight has not gone back down to baseline.  He states he occasionally feels slightly short of breath but has no chest pain, no fever or chills, sweating, no nausea or vomiting, no cough.  His left leg is slightly more swollen than the right but patient and his wife both compared this to his baseline ever since he had vein removed from that leg for his CABG.  He has no calf pain or swelling.    HPI     Prior to Admission medications   Medication Sig Start Date End Date Taking? Authorizing Provider  acetaminophen  (TYLENOL ) 650 MG CR tablet Take 1,300 mg by mouth every 8 (eight) hours as needed for pain.    [provider]  albuterol  (VENTOLIN  HFA) 108 (90 Base) MCG/ACT inhaler Inhale 1-2 puffs into the lungs every 6 (six) hours as needed for shortness of breath. 03/23/22   [provider]  alum & mag hydroxide-simeth (MAALOX MAX) 400-400-40 MG/5ML suspension Take 15 mLs by mouth as needed for indigestion. 12/20/23   Kommor, Madison, MD  amLODipine  (NORVASC ) 10 MG tablet Take 1 tablet (10 mg total) by mouth daily. 07/29/22   Johnson, Clanford L, MD  apixaban  (ELIQUIS ) 2.5 MG TABS tablet Take 2.5 mg by mouth 2 (two) times daily.    [provider]  atorvastatin  (LIPITOR) 80 MG tablet Take 40 mg by mouth every evening.    [provider]  Cyanocobalamin  (VITAMIN B 12) 500 MCG TABS Take 1,000 mcg by mouth daily. 03/07/20   Bari Theodoro FALCON, MD  diclofenac Sodium  (VOLTAREN) 1 % GEL Apply 2 g topically every 6 (six) hours as needed (pain). 04/10/24   [provider]  ezetimibe  (ZETIA ) 10 MG tablet Take 1 tablet (10 mg total) by mouth daily. 07/25/23   Debera Jayson MATSU, MD  glucose-Vitamin C  4-0.006 GM CHEW chewable tablet Chew 4 tablets by mouth as directed. CHEW FOUR TABLETS BY MOUTH AS DIRECTED BY YOUR PROVIDER (REPEAT EVERY 15 MINUTES IF BLOOD SUGAR LESS THAN 70) 03/09/22   [provider]  HYDROcodone -acetaminophen  (NORCO/VICODIN) 5-325 MG tablet Take 1 tablet by mouth every 6 (six) hours as needed. 05/27/23   Towana Ozell BROCKS, MD  insulin  aspart protamine- aspart (NOVOLOG  MIX 70/30) (70-30) 100 UNIT/ML injection Inject 0.4 mLs (40 Units total) into the skin 2 (two) times daily with a meal. 07/29/22   Johnson, Clanford L, MD  isosorbide  mononitrate (IMDUR ) 30 MG 24 hr tablet Take 1 tablet (30 mg total) by mouth every evening. 01/08/22 05/09/97  Debera Jayson MATSU, MD  metoprolol  tartrate (LOPRESSOR ) 50 MG tablet Take 25 mg by mouth 2 (two) times daily.    [provider]  mycophenolate  (CELLCEPT ) 500 MG tablet Take 2 tablets by mouth 2 (two) times daily. 05/21/22   [provider]  oxyCODONE -acetaminophen  (PERCOCET/ROXICET) 5-325 MG tablet Take 1 tablet by mouth every 6 (six) hours as needed for  severe pain (pain score 7-10). 10/27/23   Towana Ozell BROCKS, MD  oxyCODONE -acetaminophen  (PERCOCET/ROXICET) 5-325 MG tablet Take 1 tablet by mouth every 6 (six) hours as needed for severe pain (pain score 7-10). 10/27/23   Towana Ozell BROCKS, MD  pioglitazone  (ACTOS ) 30 MG tablet Take 30 mg by mouth daily. 03/09/22   [provider]  Semaglutide (OZEMPIC, 0.25 OR 0.5 MG/DOSE, [place]) Inject 0.25 mg into the skin once a week. 02/26/22   [provider]  tamsulosin  (FLOMAX ) 0.4 MG CAPS capsule Take 0.4 mg by mouth every evening.    [provider]  torsemide  (DEMADEX ) 20 MG tablet Take 60 mg alternating with 40 mg every  other day 07/03/24   Debera Jayson MATSU, MD  traMADol  (ULTRAM ) 50 MG tablet Take 1 tablet (50 mg total) by mouth every 8 (eight) hours as needed. 10/27/23   Bernard Drivers, MD  Vitamin D , Ergocalciferol , (DRISDOL) 1.25 MG (50000 UNIT) CAPS capsule Take 50,000 Units by mouth once a week. 06/02/22   [provider]    Allergies: Benazepril, Lisinopril, and Ivp dye [iodinated contrast media]    Review of Systems  Updated Vital Signs BP (!) 117/53   Pulse (!) 50   Temp 98.8 F (37.1 C) (Oral)   Resp 10   Ht 5' 8 (1.727 m)   Wt 122.9 kg   SpO2 100%   BMI 41.21 kg/m   Physical Exam Vitals and nursing note reviewed.  Constitutional:      General: He is not in acute distress.    Appearance: He is well-developed.  HENT:     Head: Normocephalic and atraumatic.     Mouth/Throat:     Mouth: Mucous membranes are moist.  Eyes:     Extraocular Movements: Extraocular movements intact.     Conjunctiva/sclera: Conjunctivae normal.     Pupils: Pupils are equal, round, and reactive to light.  Cardiovascular:     Rate and Rhythm: Normal rate and regular rhythm.     Heart sounds: No murmur heard. Pulmonary:     Effort: Pulmonary effort is normal. No respiratory distress.     Breath sounds: Normal breath sounds.  Abdominal:     Palpations: Abdomen is soft.     Tenderness: There is no abdominal tenderness.  Musculoskeletal:        General: No swelling.     Cervical back: Neck supple.     Right lower leg: Edema present.     Left lower leg: Edema present.     Comments: Mild pitting edema to bilateral knees, no calf tenderness, no skin changes  Skin:    General: Skin is warm and dry.     Capillary Refill: Capillary refill takes less than 2 seconds.  Neurological:     General: No focal deficit present.     Mental Status: He is alert and oriented to person, place, and time.  Psychiatric:        Mood and Affect: Mood normal.     (all labs ordered are listed, but only abnormal  results are displayed) Labs Reviewed  BASIC METABOLIC PANEL WITH GFR - Abnormal; Notable for the following components:      Result Value   Glucose, Bld 135 (*)    BUN 27 (*)    Creatinine, Ser 1.98 (*)    Calcium  8.6 (*)    GFR, Estimated 33 (*)    All other components within normal limits  CBC - Abnormal; Notable for the following components:  RBC 3.69 (*)    Hemoglobin 11.2 (*)    HCT 35.8 (*)    All other components within normal limits  PRO BRAIN NATRIURETIC PEPTIDE - Abnormal; Notable for the following components:   Pro Brain Natriuretic Peptide 334.0 (*)    All other components within normal limits    EKG: None  Radiology: [facility] Chest Port 1 View Result Date: 09/15/2024 CLINICAL DATA:  Shortness of breath with weight gain. EXAM: PORTABLE CHEST 1 VIEW COMPARISON:  12/20/2023 FINDINGS: Sternotomy wires unchanged. Lungs are hypoinflated without focal airspace consolidation or effusion. Cardiomediastinal silhouette and remainder of the exam is unchanged. IMPRESSION: Hypoinflation without acute cardiopulmonary disease. Electronically Signed   By: Toribio Agreste M.D.   On: 09/15/2024 09:36     Procedures   Medications Ordered in the ED  furosemide  (LASIX ) tablet 80 mg (80 mg Oral Given 09/15/24 1036)  potassium chloride  SA (KLOR-CON  M) CR tablet 20 mEq (20 mEq Oral Given 09/15/24 1036)                                    Medical Decision Making This patient presents to the ED for concern of extremity swelling and weight gain, this involves an extensive number of treatment options, and is a complaint that carries with it a high risk of complications and morbidity.  The differential diagnosis includes failure, cellulitis, DVT, , pneumonia, other    Additional history obtained:  Additional history obtained from EMR External records from outside source obtained and reviewed including ER notes and labs   Lab Tests:  I Ordered, and personally interpreted labs.  The  pertinent results include: proBNP is 334, CBC with no leukocytosis, mild anemia at 11.2, BMP with BUN 27 creatinine 1.98 consistent with his CKD   Imaging Studies ordered:  I ordered imaging studies including chest x-ray I independently visualized and interpreted imaging which showed no pulmonary edema or infiltrate I agree with the radiologist interpretation     Problem List / ED Course / Critical interventions / Medication management  Patient here with about 6 pounds of weight gain despite taking 60 mg of torsemide  daily for the past 3 days, normally takes 40 mL today per his wife.  They called the cardiologist 2 days ago but had not heard back as they were waiting to hear from nephrology as well.  Patient was given IV Lasix  here, discussed that there could be some bowel wall edema preventing the torsemide  from absorbing appropriately.  Patient urinated about 3 to 400 cc of urine and stating he feels much better, ambulated with a pulse ox without any desaturations.  I did offer admission for diuresis given his history of heart failure and lack of significant urine output here, he adamantly refused and states he does not want to stay and he feels well enough to go home.  We discussed that he may still need to return if the p.o. Lasix  are not helping.  Recommended continuing the 60 mg of torsemide  tomorrow, and following with cardiology Monday.  I did place a referral to try to expedite this follow-up.  He was given strict return precautions.  His wife would prefer he stay, but even with her suggestion of admission he did not want to stay.  Patient did have slightly asymmetric swelling with left greater than right but patient states this is his baseline and unchanged, no calf tenderness, no concern for DVT. I  ordered medication including lasix   for edema  Reevaluation of the patient after these medicines showed that the patient improved I have reviewed the patients home medicines and have made  adjustments as needed     Amount and/or Complexity of Data Reviewed Labs: ordered. Radiology: ordered.  Risk Prescription drug management.        Final diagnoses:  Peripheral edema  Chronic congestive heart failure, unspecified heart failure type [facility])    ED Discharge Orders          Ordered    Ambulatory referral to Cardiology       Comments: If you have not heard from the Cardiology office within the next 72 hours please call [phone].   09/15/24 1245               Suellen Sherran DELENA DEVONNA 09/15/24 1931    Suzette Pac, MD 09/18/24 1007

## 2024-09-15 NOTE — ED Triage Notes (Signed)
 Pt c/o fluid build up in his body. Weight baseline is 265lb and this morning is 271. Pt takes takes 20 mg bid but last few days has been 3 TID and still gaining weight.

## 2024-09-15 NOTE — Discharge Instructions (Addendum)
 You are seen today for swelling and weight gain.  We gave you IV Lasix  here and you are reporting that you are feeling better.  Fortunately your chest x-ray did not show any fluid in your lungs and your oxygen  has been normal.  We offered admission for continued IV fluid medications and monitoring, but you are choosing to go home since you are feeling better.  If you change your mind or feel worse come back to the ER.  I would recommend taking 60 mg of your torsemide  tomorrow and then calling the cardiologist and kidney specialist on Monday for further guidance

## 2024-09-17 ENCOUNTER — Other Ambulatory Visit: Payer: Self-pay

## 2024-09-17 ENCOUNTER — Inpatient Hospital Stay (HOSPITAL_COMMUNITY)
Admission: EM | Admit: 2024-09-17 | Discharge: 2024-09-20 | DRG: 291 | Disposition: A | Discharge status: Home Health | Attending: Internal Medicine | Admitting: Internal Medicine

## 2024-09-17 ENCOUNTER — Emergency Department (HOSPITAL_COMMUNITY)

## 2024-09-17 ENCOUNTER — Encounter (HOSPITAL_COMMUNITY): Payer: Self-pay

## 2024-09-17 DIAGNOSIS — Z79899 Other long term (current) drug therapy: Secondary | ICD-10-CM

## 2024-09-17 DIAGNOSIS — Z7984 Long term (current) use of oral hypoglycemic drugs: Secondary | ICD-10-CM

## 2024-09-17 DIAGNOSIS — E66813 Obesity, class 3: Secondary | ICD-10-CM | POA: Diagnosis present

## 2024-09-17 DIAGNOSIS — Z83438 Family history of other disorder of lipoprotein metabolism and other lipidemia: Secondary | ICD-10-CM

## 2024-09-17 DIAGNOSIS — N184 Chronic kidney disease, stage 4 (severe): Secondary | ICD-10-CM | POA: Diagnosis not present

## 2024-09-17 DIAGNOSIS — Z7901 Long term (current) use of anticoagulants: Secondary | ICD-10-CM

## 2024-09-17 DIAGNOSIS — Z7985 Long-term (current) use of injectable non-insulin antidiabetic drugs: Secondary | ICD-10-CM

## 2024-09-17 DIAGNOSIS — N179 Acute kidney failure, unspecified: Secondary | ICD-10-CM | POA: Diagnosis present

## 2024-09-17 DIAGNOSIS — Z87891 Personal history of nicotine dependence: Secondary | ICD-10-CM

## 2024-09-17 DIAGNOSIS — G4733 Obstructive sleep apnea (adult) (pediatric): Secondary | ICD-10-CM

## 2024-09-17 DIAGNOSIS — I5033 Acute on chronic diastolic (congestive) heart failure: Secondary | ICD-10-CM | POA: Diagnosis present

## 2024-09-17 DIAGNOSIS — E782 Mixed hyperlipidemia: Secondary | ICD-10-CM | POA: Diagnosis not present

## 2024-09-17 DIAGNOSIS — I2510 Atherosclerotic heart disease of native coronary artery without angina pectoris: Secondary | ICD-10-CM | POA: Diagnosis present

## 2024-09-17 DIAGNOSIS — K219 Gastro-esophageal reflux disease without esophagitis: Secondary | ICD-10-CM | POA: Diagnosis present

## 2024-09-17 DIAGNOSIS — R600 Localized edema: Secondary | ICD-10-CM | POA: Diagnosis present

## 2024-09-17 DIAGNOSIS — E1129 Type 2 diabetes mellitus with other diabetic kidney complication: Secondary | ICD-10-CM | POA: Diagnosis present

## 2024-09-17 DIAGNOSIS — D631 Anemia in chronic kidney disease: Secondary | ICD-10-CM | POA: Diagnosis present

## 2024-09-17 DIAGNOSIS — Z91041 Radiographic dye allergy status: Secondary | ICD-10-CM

## 2024-09-17 DIAGNOSIS — E1122 Type 2 diabetes mellitus with diabetic chronic kidney disease: Secondary | ICD-10-CM | POA: Diagnosis present

## 2024-09-17 DIAGNOSIS — Z6841 Body Mass Index (BMI) 40.0 and over, adult: Secondary | ICD-10-CM

## 2024-09-17 DIAGNOSIS — Z794 Long term (current) use of insulin: Secondary | ICD-10-CM

## 2024-09-17 DIAGNOSIS — Z888 Allergy status to other drugs, medicaments and biological substances status: Secondary | ICD-10-CM

## 2024-09-17 DIAGNOSIS — Z8673 Personal history of transient ischemic attack (TIA), and cerebral infarction without residual deficits: Secondary | ICD-10-CM

## 2024-09-17 DIAGNOSIS — E8770 Fluid overload, unspecified: Secondary | ICD-10-CM | POA: Diagnosis not present

## 2024-09-17 DIAGNOSIS — R808 Other proteinuria: Secondary | ICD-10-CM | POA: Diagnosis present

## 2024-09-17 DIAGNOSIS — Z833 Family history of diabetes mellitus: Secondary | ICD-10-CM

## 2024-09-17 DIAGNOSIS — Z789 Other specified health status: Secondary | ICD-10-CM

## 2024-09-17 DIAGNOSIS — N1832 Chronic kidney disease, stage 3b: Secondary | ICD-10-CM | POA: Diagnosis present

## 2024-09-17 DIAGNOSIS — I739 Peripheral vascular disease, unspecified: Secondary | ICD-10-CM | POA: Diagnosis present

## 2024-09-17 DIAGNOSIS — I890 Lymphedema, not elsewhere classified: Secondary | ICD-10-CM | POA: Diagnosis present

## 2024-09-17 DIAGNOSIS — E1151 Type 2 diabetes mellitus with diabetic peripheral angiopathy without gangrene: Secondary | ICD-10-CM | POA: Diagnosis present

## 2024-09-17 DIAGNOSIS — Z951 Presence of aortocoronary bypass graft: Secondary | ICD-10-CM

## 2024-09-17 DIAGNOSIS — I130 Hypertensive heart and chronic kidney disease with heart failure and stage 1 through stage 4 chronic kidney disease, or unspecified chronic kidney disease: Secondary | ICD-10-CM | POA: Diagnosis not present

## 2024-09-17 DIAGNOSIS — Z8249 Family history of ischemic heart disease and other diseases of the circulatory system: Secondary | ICD-10-CM

## 2024-09-17 DIAGNOSIS — Z9049 Acquired absence of other specified parts of digestive tract: Secondary | ICD-10-CM

## 2024-09-17 DIAGNOSIS — I10 Essential (primary) hypertension: Secondary | ICD-10-CM | POA: Diagnosis present

## 2024-09-17 DIAGNOSIS — I480 Paroxysmal atrial fibrillation: Secondary | ICD-10-CM | POA: Diagnosis present

## 2024-09-17 LAB — GLUCOSE, CAPILLARY
Glucose-Capillary: 189 mg/dL — ABNORMAL HIGH (ref 70–99)
Glucose-Capillary: 127 mg/dL — ABNORMAL HIGH (ref 70–99)

## 2024-09-17 LAB — CBC
HCT: 15.1 % — ABNORMAL LOW (ref 39.0–52.0)
HCT: 37.1 % — ABNORMAL LOW (ref 39.0–52.0)
Hemoglobin: 4.7 g/dL — CL (ref 13.0–17.0)
Hemoglobin: 11.6 g/dL — ABNORMAL LOW (ref 13.0–17.0)
MCH: 31.1 pg (ref 26.0–34.0)
MCH: 30 pg (ref 26.0–34.0)
MCHC: 31.1 g/dL (ref 30.0–36.0)
MCHC: 31.3 g/dL (ref 30.0–36.0)
MCV: 100 fL (ref 80.0–100.0)
MCV: 95.9 fL (ref 80.0–100.0)
Platelets: 161 K/uL (ref 150–400)
Platelets: 197 K/uL (ref 150–400)
RBC: 1.51 MIL/uL — ABNORMAL LOW (ref 4.22–5.81)
RBC: 3.87 MIL/uL — ABNORMAL LOW (ref 4.22–5.81)
RDW: 13.2 % (ref 11.5–15.5)
RDW: 13.2 % (ref 11.5–15.5)
WBC: 8.2 K/uL (ref 4.0–10.5)
WBC: 4.6 K/uL (ref 4.0–10.5)
nRBC: 0 % (ref 0.0–0.2)
nRBC: 0 % (ref 0.0–0.2)

## 2024-09-17 LAB — BASIC METABOLIC PANEL WITH GFR
Anion gap: 10 (ref 5–15)
BUN: 27 mg/dL — ABNORMAL HIGH (ref 8–23)
CO2: 27 mmol/L (ref 22–32)
Calcium: 8.7 mg/dL — ABNORMAL LOW (ref 8.9–10.3)
Chloride: 103 mmol/L (ref 98–111)
Creatinine, Ser: 1.76 mg/dL — ABNORMAL HIGH (ref 0.61–1.24)
GFR, Estimated: 38 mL/min — ABNORMAL LOW (ref >60)
Glucose, Bld: 66 mg/dL — ABNORMAL LOW (ref 70–99)
Potassium: 3.7 mmol/L (ref 3.5–5.1)
Sodium: 140 mmol/L (ref 135–145)

## 2024-09-17 LAB — CBG MONITORING, ED
Glucose-Capillary: 38 mg/dL — CL (ref 70–99)
Glucose-Capillary: 109 mg/dL — ABNORMAL HIGH (ref 70–99)
Glucose-Capillary: 35 mg/dL — CL (ref 70–99)
Glucose-Capillary: 63 mg/dL — ABNORMAL LOW (ref 70–99)

## 2024-09-17 LAB — POC OCCULT BLOOD, ED: Fecal Occult Bld: NEGATIVE

## 2024-09-17 LAB — PRO BRAIN NATRIURETIC PEPTIDE: Pro Brain Natriuretic Peptide: 338 pg/mL — ABNORMAL HIGH (ref <300.0)

## 2024-09-17 LAB — MAGNESIUM: Magnesium: 2.3 mg/dL (ref 1.7–2.4)

## 2024-09-17 MED ORDER — INSULIN ASPART 100 UNIT/ML IJ SOLN
0.0000 [IU] | Freq: Three times a day (TID) | INTRAMUSCULAR | Status: DC
  Administered 2024-09-17: 2 [IU] via SUBCUTANEOUS
  Administered 2024-09-18 (×2): 1 [IU] via SUBCUTANEOUS
  Administered 2024-09-18: 3 [IU] via SUBCUTANEOUS
  Administered 2024-09-19: 2 [IU] via SUBCUTANEOUS
  Administered 2024-09-19: 5 [IU] via SUBCUTANEOUS
  Administered 2024-09-19: 2 [IU] via SUBCUTANEOUS
  Administered 2024-09-20: 3 [IU] via SUBCUTANEOUS
  Filled 2024-09-17 (×8): qty 1

## 2024-09-17 MED ORDER — FUROSEMIDE 10 MG/ML IJ SOLN
80.0000 mg | Freq: Once | INTRAMUSCULAR | Status: AC
  Administered 2024-09-17: 80 mg via INTRAVENOUS
  Filled 2024-09-17: qty 8

## 2024-09-17 MED ORDER — EZETIMIBE 10 MG PO TABS
10.0000 mg | ORAL_TABLET | Freq: Every day | ORAL | Status: DC
  Administered 2024-09-17 – 2024-09-20 (×4): 10 mg via ORAL
  Filled 2024-09-17 (×4): qty 1

## 2024-09-17 MED ORDER — HEPARIN SODIUM (PORCINE) 5000 UNIT/ML IJ SOLN: 5000.0000 [IU] | Freq: Three times a day (TID) | INTRAMUSCULAR | Status: DC

## 2024-09-17 MED ORDER — POTASSIUM CHLORIDE 20 MEQ PO PACK
20.0000 meq | PACK | Freq: Two times a day (BID) | ORAL | Status: DC
  Administered 2024-09-17 – 2024-09-20 (×6): 20 meq via ORAL
  Filled 2024-09-17 (×6): qty 1

## 2024-09-17 MED ORDER — PIOGLITAZONE HCL 30 MG PO TABS: 30.0000 mg | ORAL_TABLET | Freq: Every day | ORAL | Status: DC

## 2024-09-17 MED ORDER — AMLODIPINE BESYLATE 5 MG PO TABS: 10.0000 mg | ORAL_TABLET | Freq: Every day | ORAL | Status: DC

## 2024-09-17 MED ORDER — POTASSIUM CHLORIDE CRYS ER 20 MEQ PO TBCR
40.0000 meq | EXTENDED_RELEASE_TABLET | Freq: Once | ORAL | Status: AC
  Administered 2024-09-17: 40 meq via ORAL
  Filled 2024-09-17: qty 2

## 2024-09-17 MED ORDER — FUROSEMIDE 10 MG/ML IJ SOLN
80.0000 mg | Freq: Two times a day (BID) | INTRAMUSCULAR | Status: DC
  Administered 2024-09-17 – 2024-09-19 (×4): 80 mg via INTRAVENOUS
  Filled 2024-09-17 (×4): qty 8

## 2024-09-17 MED ORDER — INSULIN ASPART 100 UNIT/ML IJ SOLN
0.0000 [IU] | Freq: Every day | INTRAMUSCULAR | Status: DC
  Administered 2024-09-19: 3 [IU] via SUBCUTANEOUS
  Filled 2024-09-17: qty 1

## 2024-09-17 MED ORDER — APIXABAN 2.5 MG PO TABS
2.5000 mg | ORAL_TABLET | Freq: Two times a day (BID) | ORAL | Status: DC
  Administered 2024-09-17 – 2024-09-20 (×6): 2.5 mg via ORAL
  Filled 2024-09-17 (×6): qty 1

## 2024-09-17 MED ORDER — ACETAMINOPHEN 500 MG PO TABS
1000.0000 mg | ORAL_TABLET | Freq: Three times a day (TID) | ORAL | Status: DC | PRN
  Administered 2024-09-19: 1000 mg via ORAL
  Filled 2024-09-17: qty 2

## 2024-09-17 MED ORDER — VITAMIN B 12 500 MCG PO TABS: 1000.0000 ug | ORAL_TABLET | Freq: Every day | ORAL | Status: DC

## 2024-09-17 MED ORDER — METOPROLOL TARTRATE 25 MG PO TABS
25.0000 mg | ORAL_TABLET | Freq: Two times a day (BID) | ORAL | Status: DC
  Administered 2024-09-17 – 2024-09-20 (×6): 25 mg via ORAL
  Filled 2024-09-17 (×6): qty 1

## 2024-09-17 MED ORDER — ALBUTEROL SULFATE (2.5 MG/3ML) 0.083% IN NEBU: 3.0000 mL | INHALATION_SOLUTION | Freq: Four times a day (QID) | RESPIRATORY_TRACT | Status: DC | PRN

## 2024-09-17 MED ORDER — TAMSULOSIN HCL 0.4 MG PO CAPS
0.4000 mg | ORAL_CAPSULE | Freq: Every evening | ORAL | Status: DC
  Administered 2024-09-17 – 2024-09-19 (×3): 0.4 mg via ORAL
  Filled 2024-09-17 (×3): qty 1

## 2024-09-17 MED ORDER — ISOSORBIDE MONONITRATE ER 30 MG PO TB24: 30.0000 mg | ORAL_TABLET | Freq: Every evening | ORAL | Status: DC

## 2024-09-17 MED ORDER — LOSARTAN POTASSIUM 25 MG PO TABS
25.0000 mg | ORAL_TABLET | Freq: Every day | ORAL | Status: DC
  Administered 2024-09-18 – 2024-09-20 (×3): 25 mg via ORAL
  Filled 2024-09-17 (×3): qty 1

## 2024-09-17 MED ORDER — ATORVASTATIN CALCIUM 40 MG PO TABS: 40.0000 mg | ORAL_TABLET | Freq: Every evening | ORAL | Status: DC

## 2024-09-17 NOTE — Plan of Care (Signed)
  Problem: Health Behavior/Discharge Planning: Goal: Ability to manage health-related needs will improve Outcome: Progressing   Problem: Education: Goal: Knowledge of General Education information will improve Description: Including pain rating scale, medication(s)/side effects and non-pharmacologic comfort measures Outcome: Progressing   Problem: Clinical Measurements: Goal: Ability to maintain clinical measurements within normal limits will improve Outcome: Progressing Goal: Will remain free from infection Outcome: Progressing Goal: Diagnostic test results will improve Outcome: Progressing Goal: Respiratory complications will improve Outcome: Progressing Goal: Cardiovascular complication will be avoided Outcome: Progressing   Problem: Nutrition: Goal: Adequate nutrition will be maintained Outcome: Progressing   Problem: Activity: Goal: Risk for activity intolerance will decrease Outcome: Progressing   Problem: Coping: Goal: Level of anxiety will decrease Outcome: Progressing   Problem: Pain Managment: Goal: General experience of comfort will improve and/or be controlled Outcome: Progressing   Problem: Coping: Goal: Ability to adjust to condition or change in health will improve Outcome: Progressing   Problem: Education: Goal: Ability to describe self-care measures that may prevent or decrease complications (Diabetes Survival Skills Education) will improve Outcome: Progressing Goal: Individualized Educational Video(s) Outcome: Progressing   Problem: Nutritional: Goal: Maintenance of adequate nutrition will improve Outcome: Progressing Goal: Progress toward achieving an optimal weight will improve Outcome: Progressing   Problem: Metabolic: Goal: Ability to maintain appropriate glucose levels will improve Outcome: Progressing   Problem: Education: Goal: Ability to demonstrate management of disease process will improve Outcome: Progressing Goal: Ability to  verbalize understanding of medication therapies will improve Outcome: Progressing Goal: Individualized Educational Video(s) Outcome: Progressing   Problem: Activity: Goal: Capacity to carry out activities will improve Outcome: Progressing

## 2024-09-17 NOTE — ED Notes (Signed)
 Patient transported to X-ray

## 2024-09-17 NOTE — TOC Initial Note (Signed)
 Transition of Care [facility]) - Initial/Assessment Note    Patient Details  Name: [name] MRN: [id number] Date of Birth: 09/08/1941  Transition of Care [facility]) CM/SW Contact:    Sharlyne Stabs, RN Phone Number: 09/17/2024, 3:50 PM  Clinical Narrative:     Patient admitted with fluid overload. Lives at home with his spouse, has an Aide with [employer] . [facility] notification completed   [phone]. IPCM following.              Barriers to Discharge: Continued Medical Work up   Patient Goals and CMS Choice    Activities of Daily Living   ADL Screening (condition at time of admission) Independently performs ADLs?: No Does the patient have a NEW difficulty with bathing/dressing/toileting/self-feeding that is expected to last >3 days?: Yes (Initiates electronic notice to provider for possible OT consult) Does the patient have a NEW difficulty with getting in/out of bed, walking, or climbing stairs that is expected to last >3 days?: Yes (Initiates electronic notice to provider for possible PT consult) Does the patient have a NEW difficulty with communication that is expected to last >3 days?: No Is the patient deaf or have difficulty hearing?: Yes Does the patient have difficulty seeing, even when wearing glasses/contacts?: Yes Does the patient have difficulty concentrating, remembering, or making decisions?: Yes   Orientation: : Oriented to Self, Oriented to Place, Oriented to  Time, Oriented to Situation Alcohol  / Substance Use: Not Applicable Psych Involvement: No (comment)  Admission diagnosis:  Fluid overload [E87.70] Failure of outpatient treatment [Z78.9] Hypervolemia, unspecified hypervolemia type [E87.70] Patient Active Problem List   Diagnosis Date Noted   Fluid overload 09/17/2024   Gynecomastia, male 01/17/2024   Dizziness 07/27/2022   Nausea & vomiting 07/27/2022   Atrial fibrillation with rapid ventricular response (HCC) 05/02/2020   Anemia of chronic disease  03/07/2020   CKD stage 3 due to type 2 diabetes mellitus (HCC) 06/12/2019   Chest pain 06/07/2018   Hypoxia    Lactic acidosis 02/24/2018   Dyspnea 02/20/2018   BPH (benign prostatic hyperplasia) 02/20/2018   DDD (degenerative disc disease), lumbar 10/17/2017   Chronic diastolic heart failure (HCC) 10/20/2015   Peripheral edema 01/22/2015   Tinea pedis 10/21/2014   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 11/27/2013   CKD (chronic kidney disease), stage IV (HCC) 11/21/2013   Left leg weakness 09/06/2013   Left-sided weakness 08/22/2013   OSA (obstructive sleep apnea) 04/12/2013   DM (diabetes mellitus) type II controlled with renal manifestation (HCC) 11/28/2012   PVD (peripheral vascular disease) (HCC) 11/28/2012   CAD (coronary artery disease) 11/28/2012   Mixed hyperlipidemia 11/28/2012   Obesity, Class III, BMI 40-49.9 (morbid obesity) (HCC) 11/28/2012   History of stroke 11/28/2012   OAB (overactive bladder) 11/28/2012   Essential hypertension 05/20/2011   PCP:  Darren Monetta RAMAN, MD Pharmacy:   [facility] [address], [place] - [address] [place] [address] Phone: [phone] Fax: [phone]  [facility] - [place], [place] - [address] [place] [address] Phone: [phone] Fax: [phone]  [facility]) - [place], [place] - [address] [place] [address] [place] [address] Phone: [phone] Fax: [phone]     Social Drivers of Health (SDOH) Social History: SDOH Screenings   Food Insecurity: No Food Insecurity (09/17/2024)  Housing: Low Risk  (09/17/2024)  Transportation Needs: No Transportation Needs (  09/17/2024)  Utilities: Not At Risk (09/17/2024)  Alcohol  Screen: Low Risk  (03/07/2020)  Depression (PHQ2-9): High Risk (04/13/2022)  Social Connections:  Moderately Integrated (09/17/2024)  Tobacco Use: Medium Risk (09/17/2024)   SDOH Interventions: Transportation Interventions: Patient Declined

## 2024-09-17 NOTE — H&P (Addendum)
 History and Physical    [name] FMW:[id number] DOB: 05/08/1941 DOA: 09/17/2024  PCP: Darren Monetta RAMAN, MD  Patient coming from: Home  I have personally briefly reviewed patient's old medical records available.   Chief Complaint: Gaining fluid, gaining weight  HPI: [name] is a 83 y.o. male with medical history significant of CKD stage IV with baseline creatinine about 1.7-1.8, type 2 diabetes on insulin , hypertension, hyperlipidemia, current diastolic dysfunction and fluid overload on torsemide  at home, ambulatory dysfunction, paroxysmal A-fib on Eliquis  and metoprolol  presents to the emergency room with about 1 week of worsening leg swelling, gaining weight at home more than 5 pounds.  He has been in touch with his nephrologist Dr. Rachele who had asked to increase his torsemide  from 40 mg daily to alternate 60 and 40 mg every other day however he was unable to make much urine so he called his doctor who asked him to come to the ER.  Patient does not walk show he is not sure he is short of breath.  Denies any orthopnea or PND.  Leg is already swollen, however now it is worse.  Weight is up 5 pounds as measured at home every day. ED Course: Blood pressure is stable.  On room air.  Creatinine 1.76.  Hemoglobin is stable.  Chest x-ray with mostly chronic changes.  BNP 338. Patient did have significant drop in blood sugars while in the ER, corrected with oral intake.  He took morning insulin  as well as ate some dinner before coming to the hospital. Patient was given Lasix  80 mg once and potassium chloride  40 mEq once in the ER before calling for admission.  Review of Systems: all systems are reviewed and pertinent positive as per HPI otherwise rest are negative.    Past Medical History:  Diagnosis Date   Anemia    C. difficile diarrhea    Chest pain 06/07/2018   CKD (chronic kidney disease), stage III (HCC)    Coronary artery disease    Status post CABG in [facility])   Diabetes mellitus with stage 1 chronic kidney disease (HCC) 05/20/2011   Dyspnea 02/20/2018   GERD (gastroesophageal reflux disease)    Glaucoma    Hyperlipidemia    Hypertension    Morbid obesity (HCC)    PAF (paroxysmal atrial fibrillation) (HCC)    Peripheral vascular disease    Sleep apnea    Stroke (HCC) 2008, 2014, 2015    Past Surgical History:  Procedure Laterality Date   CHOLECYSTECTOMY     CORONARY ARTERY BYPASS GRAFT     CYST EXCISION  12/2023   NO PAST SURGERIES     THYROID  SURGERY      Social history   reports that he quit smoking about 42 years ago. His smoking use included cigarettes. He started smoking about 62 years ago. He has a 20 pack-year smoking history. He has never used smokeless tobacco. He reports that he does not drink alcohol  and does not use drugs.  Allergies  Allergen Reactions   Benazepril Other (See Comments)    Unknown    Lisinopril Other (See Comments)    Unknown    Ivp Dye [Iodinated Contrast Media] Rash    Family History  Problem Relation Age of Onset   Diabetes Mother    Heart failure Mother    Hypertension Mother    Diabetes Father    Heart failure Father    Hypertension Father  Diabetes Sister    Heart failure Sister    Hypertension Sister    Hyperlipidemia Sister    Diabetes Brother    Heart failure Brother    Hypertension Brother      Prior to Admission medications   Medication Sig Start Date End Date Taking? Authorizing Provider  acetaminophen  (TYLENOL ) 650 MG CR tablet Take 1,300 mg by mouth every 8 (eight) hours as needed for pain.   Yes [provider]  albuterol  (VENTOLIN  HFA) 108 (90 Base) MCG/ACT inhaler Inhale 1-2 puffs into the lungs every 6 (six) hours as needed for shortness of breath. 03/23/22  Yes [provider]  alum & mag hydroxide-simeth (MAALOX MAX) 400-400-40 MG/5ML suspension Take 15 mLs by mouth as needed for indigestion. 12/20/23  Yes Kommor, Madison,  MD  amLODipine  (NORVASC ) 10 MG tablet Take 1 tablet (10 mg total) by mouth daily. 07/29/22  Yes Johnson, Clanford L, MD  apixaban  (ELIQUIS ) 2.5 MG TABS tablet Take 2.5 mg by mouth 2 (two) times daily.   Yes [provider]  atorvastatin  (LIPITOR) 80 MG tablet Take 40 mg by mouth every evening.   Yes [provider]  Cyanocobalamin  (VITAMIN B 12) 500 MCG TABS Take 1,000 mcg by mouth daily. 03/07/20  Yes , Theodoro FALCON, MD  diclofenac Sodium (VOLTAREN) 1 % GEL Apply 2 g topically every 6 (six) hours as needed (pain). 04/10/24  Yes [provider]  ezetimibe  (ZETIA ) 10 MG tablet Take 1 tablet (10 mg total) by mouth daily. 07/25/23  Yes Debera Jayson MATSU, MD  glucose-Vitamin C  4-0.006 GM CHEW chewable tablet Chew 4 tablets by mouth as directed. CHEW FOUR TABLETS BY MOUTH AS DIRECTED BY YOUR PROVIDER (REPEAT EVERY 15 MINUTES IF BLOOD SUGAR LESS THAN 70) 03/09/22  Yes [provider]  insulin  aspart protamine- aspart (NOVOLOG  MIX 70/30) (70-30) 100 UNIT/ML injection Inject 0.4 mLs (40 Units total) into the skin 2 (two) times daily with a meal. Patient taking differently: Inject 40-50 Units into the skin See admin instructions. Inject 40 units in the morning and 50 in the evening 07/29/22  Yes Johnson, Clanford L, MD  isosorbide  mononitrate (IMDUR ) 30 MG 24 hr tablet Take 1 tablet (30 mg total) by mouth every evening. 01/08/22 05/09/97 Yes Debera Jayson MATSU, MD  lidocaine  (LIDODERM ) 5 % Place 1 patch onto the skin daily. 07/09/24  Yes [provider]  losartan  (COZAAR ) 25 MG tablet Take 25 mg by mouth daily. 08/29/24  Yes [provider]  metoprolol  tartrate (LOPRESSOR ) 50 MG tablet Take 25 mg by mouth 2 (two) times daily.   Yes [provider]  pioglitazone  (ACTOS ) 30 MG tablet Take 30 mg by mouth daily. 03/09/22  Yes [provider]  Semaglutide (OZEMPIC, 0.25 OR 0.5 MG/DOSE, ) Inject 0.25 mg into the skin once a week. 02/26/22  Yes [provider]  tamsulosin  (FLOMAX ) 0.4 MG CAPS capsule Take 0.4 mg by mouth every evening.   Yes [provider]  torsemide  (DEMADEX ) 20 MG tablet Take 60 mg alternating with 40 mg every other day 07/03/24  Yes Debera Jayson MATSU, MD  Vitamin D , Ergocalciferol , (DRISDOL) 1.25 MG (50000 UNIT) CAPS capsule Take 50,000 Units by mouth once a week. 06/02/22  Yes [provider]    Physical Exam: Vitals:   09/17/24 1100 09/17/24 1148 09/17/24 1330 09/17/24 1346  BP: (!) 148/45 (!) 111/52 134/61   Pulse: (!) 52 (!) 53 61   Resp: 15 18 13    Temp:    ROLLEN)  96.5 F (35.8 C)  TempSrc:    Axillary  SpO2: 100% 94% 97%   Weight:      Height:        Constitutional: NAD, calm, comfortable.  Pleasant interactive.  Chronically sick looking.  Not in any distress. Vitals:   09/17/24 1100 09/17/24 1148 09/17/24 1330 09/17/24 1346  BP: (!) 148/45 (!) 111/52 134/61   Pulse: (!) 52 (!) 53 61   Resp: 15 18 13    Temp:    (!) 96.5 F (35.8 C)  TempSrc:    Axillary  SpO2: 100% 94% 97%   Weight:      Height:       Eyes: PERRL, lids and conjunctivae normal ENMT: Mucous membranes are moist. Posterior pharynx clear of any exudate or lesions.Normal dentition.  Neck: normal, supple, no masses, no thyromegaly. Respiratory: clear to auscultation bilaterally, no wheezing, no crackles. Normal respiratory effort. No accessory muscle use.  Difficult to auscultate.  On room air.  No added sounds. Cardiovascular: Regular rate and rhythm, no murmurs / rubs / gallops.  1+ edema on the right leg.  2+ edema on the left leg.  2+ pedal pulses. No carotid bruits.  Difficult to assess JVD. Abdomen: no tenderness, no masses palpated. No hepatosplenomegaly. Bowel sounds positive.  Obese and pendulous. Musculoskeletal: no clubbing / cyanosis. No joint deformity upper and lower extremities. Good ROM, no contractures. Normal muscle tone.  Skin: no rashes, lesions, ulcers. No induration Neurologic: CN 2-12 grossly  intact. Sensation intact, DTR normal. Strength 5/5 in all 4.  Psychiatric: Normal judgment and insight. Alert and oriented x 3. Normal mood.     Labs on Admission: I have personally reviewed following labs and imaging studies  CBC: Recent Labs  Lab 09/15/24 0931 09/17/24 0958 09/17/24 1053  WBC 4.6 8.2 4.6  HGB 11.2* 4.7* 11.6*  HCT 35.8* 15.1* 37.1*  MCV 97.0 100.0 95.9  PLT 178 161 197   Basic Metabolic Panel: Recent Labs  Lab 09/15/24 0931 09/17/24 0958 09/17/24 1043  NA 139 140  --   K 3.9 3.7  --   CL 100 103  --   CO2 27 27  --   GLUCOSE 135* 66*  --   BUN 27* 27*  --   CREATININE 1.98* 1.76*  --   CALCIUM  8.6* 8.7*  --   MG  --   --  2.3   GFR: Estimated Creatinine Clearance: 41.8 mL/min (A) (by C-G formula based on SCr of 1.76 mg/dL (H)). Liver Function Tests: No results for input(s): AST, ALT, ALKPHOS, BILITOT, PROT, ALBUMIN in the last 168 hours. No results for input(s): LIPASE, AMYLASE in the last 168 hours. No results for input(s): AMMONIA in the last 168 hours. Coagulation Profile: No results for input(s): INR, PROTIME in the last 168 hours. Cardiac Enzymes: No results for input(s): CKTOTAL, CKMB, CKMBINDEX, TROPONINI in the last 168 hours. BNP (last 3 results) Recent Labs    09/15/24 0931 09/17/24 1043  PROBNP 334.0* 338.0*   HbA1C: No results for input(s): HGBA1C in the last 72 hours. CBG: Recent Labs  Lab 09/17/24 1324 09/17/24 1343 09/17/24 1352 09/17/24 1410  GLUCAP 38* 35* 63* 109*   Lipid Profile: No results for input(s): CHOL, HDL, LDLCALC, TRIG, CHOLHDL, LDLDIRECT in the last 72 hours. Thyroid  Function Tests: No results for input(s): TSH, T4TOTAL, FREET4, T3FREE, THYROIDAB in the last 72 hours. Anemia Panel: No results for input(s): VITAMINB12, FOLATE, FERRITIN, TIBC, IRON, RETICCTPCT in the last 72 hours.  Urine analysis:    Component Value Date/Time    COLORURINE YELLOW 08/07/2023 1027   APPEARANCEUR CLEAR 08/07/2023 1027   LABSPEC 1.016 08/07/2023 1027   PHURINE 5.0 08/07/2023 1027   GLUCOSEU NEGATIVE 08/07/2023 1027   HGBUR SMALL (A) 08/07/2023 1027   BILIRUBINUR NEGATIVE 08/07/2023 1027   KETONESUR NEGATIVE 08/07/2023 1027   PROTEINUR 30 (A) 08/07/2023 1027   UROBILINOGEN 0.2 03/28/2014 0145   NITRITE NEGATIVE 08/07/2023 1027   LEUKOCYTESUR NEGATIVE 08/07/2023 1027    Radiological Exams on Admission: DG Chest 2 View Result Date: 09/17/2024 EXAM: 2 VIEW(S) XRAY OF THE CHEST 09/17/2024 10:07:00 AM COMPARISON: 09/15/2024 CLINICAL HISTORY: Occasional Shortness of breath FINDINGS: LUNGS AND PLEURA: No focal pulmonary opacity. No pleural effusion. No pneumothorax. HEART AND MEDIASTINUM: No acute abnormality of the cardiac and mediastinal silhouettes. BONES AND SOFT TISSUES: Sternotomy wires. Surgical clips at the thoracic inlet on the right. No acute osseous abnormality. IMPRESSION: 1. No acute cardiopulmonary process. Electronically signed by: Dayne Hassell MD 09/17/2024 10:42 AM EST RP Workstation: [id number]    EKG: Independently reviewed.  Sinus rhythm.  No acute changes.  Assessment/Plan Principal Problem:   Fluid overload Active Problems:   Essential hypertension   DM (diabetes mellitus) type II controlled with renal manifestation (HCC)   PVD (peripheral vascular disease) (HCC)   CAD (coronary artery disease)   Mixed hyperlipidemia   OSA (obstructive sleep apnea)   CKD (chronic kidney disease), stage IV (HCC)   Peripheral edema     1.  Fluid overload secondary to diastolic heart failure and decreased clearance from the kidneys. Patient on torsemide  60 and 40 alternate day at home.  Gaining weight despite oral medications. Start patient on Lasix  80 mg twice daily, intake output monitoring.  Strict output monitoring.  Daily weight.  Most recent echocardiogram 2 years ago, will repeat 1 today.  If adequate improvement, will  need increasing dose of diuresis to go home with.  2.  CKD stage IV: Close monitoring of renal functions on dialysis.  At about baseline.  Recheck tomorrow morning.  3.  Type 2 diabetes on insulin , hypoglycemia: Patient hypoglycemic on presentation.  Keep on sliding scale insulin .  Holding long-acting insulin  until his appetite improves and blood sugar improved.  No more on Actos .  4.  Essential hypertension: Blood pressures stable.  He is normal on amlodipine  and Imdur ,. Continue losartan , metoprolol .  5.  Chronic lymphedema of the legs: Bilateral TED hose.  Patient does have lymphedema pump at home to use.  6.  Paroxysmal A-fib: Currently sinus rhythm.  Rate controlled on metoprolol .  Therapeutic on Eliquis .    DVT prophylaxis: Eliquis  Code Status: Full code Family Communication: Wife at the bedside Disposition Plan: Home when symptom improved Consults called: None Admission status: Observation, telemetry monitor.   Renato Applebaum MD [facility]

## 2024-09-17 NOTE — Telephone Encounter (Signed)
 [phone] Hypertension and Kidney Specialist Had to leave message

## 2024-09-17 NOTE — ED Notes (Signed)
 MD Dixon notified of cbg of 17. Pt was given juice and has meal tray. Will recheck.

## 2024-09-17 NOTE — Progress Notes (Signed)
 Patient has refused CPAP for tonight but is aware that we have them if he changes his mind.  He stated that he has one at home but does not wear it.

## 2024-09-17 NOTE — ED Notes (Signed)
 Rechecked cbg with a result of 63. Patient was given more juice per provider. Will recheck.

## 2024-09-17 NOTE — ED Triage Notes (Signed)
 Patient come in POV for complaint of having fluid on the body was seen this weekend and did not want to be admitted, but informed by PCP he need to come and get the fluid removed. Denises pain.

## 2024-09-17 NOTE — ED Provider Notes (Signed)
 [place] [facility] AT [facility] Provider Note   CSN: [id number] Arrival date & time: 09/17/24  9079     Patient presents with: Leg Swelling   [name] is a 83 y.o. male.   HPI Patient presents for bilateral leg swelling.  Medical history includes DM, CAD, HTN, HLD, atrial fibrillation, CVA, anemia, CKD.  He was seen in the ED 2 days ago for BLE edema despite increasing his home Lasix  dose.  He was given a dose of IV Lasix  at that time.  He had 300-400 cc of urine output.  He was offered admission but declined.  Since his return home, he has continued his oral diuretic medications with limited urine output and ongoing swelling in his lower extremities.  He remains 5 pounds above his normal weight.  He reports intermittent shortness of breath.    Prior to Admission medications   Medication Sig Start Date End Date Taking? Authorizing Provider  acetaminophen  (TYLENOL ) 650 MG CR tablet Take 1,300 mg by mouth every 8 (eight) hours as needed for pain.   Yes [provider]  albuterol  (VENTOLIN  HFA) 108 (90 Base) MCG/ACT inhaler Inhale 1-2 puffs into the lungs every 6 (six) hours as needed for shortness of breath. 03/23/22  Yes [provider]  alum & mag hydroxide-simeth (MAALOX MAX) 400-400-40 MG/5ML suspension Take 15 mLs by mouth as needed for indigestion. 12/20/23  Yes Kommor, Madison, MD  apixaban  (ELIQUIS ) 2.5 MG TABS tablet Take 2.5 mg by mouth 2 (two) times daily.   Yes [provider]  diclofenac Sodium (VOLTAREN) 1 % GEL Apply 2 g topically every 6 (six) hours as needed (pain). 04/10/24  Yes [provider]  ezetimibe  (ZETIA ) 10 MG tablet Take 1 tablet (10 mg total) by mouth daily. 07/25/23  Yes Debera Jayson MATSU, MD  glucose-Vitamin C  4-0.006 GM CHEW chewable tablet Chew 4 tablets by mouth as directed. CHEW FOUR TABLETS BY MOUTH AS DIRECTED BY YOUR PROVIDER (REPEAT EVERY 15 MINUTES IF BLOOD SUGAR LESS THAN 70) 03/09/22  Yes  [provider]  insulin  aspart protamine- aspart (NOVOLOG  MIX 70/30) (70-30) 100 UNIT/ML injection Inject 0.4 mLs (40 Units total) into the skin 2 (two) times daily with a meal. Patient taking differently: Inject 40-50 Units into the skin See admin instructions. Inject 40 units in the morning and 50 in the evening 07/29/22  Yes Johnson, Clanford L, MD  lidocaine  (LIDODERM ) 5 % Place 1 patch onto the skin daily. 07/09/24  Yes [provider]  losartan  (COZAAR ) 25 MG tablet Take 25 mg by mouth daily. 08/29/24  Yes [provider]  Semaglutide (OZEMPIC, 0.25 OR 0.5 MG/DOSE, [name]) Inject 0.25 mg into the skin once a week. 02/26/22  Yes [provider]  tamsulosin  (FLOMAX ) 0.4 MG CAPS capsule Take 0.4 mg by mouth every evening.   Yes [provider]  torsemide  (DEMADEX ) 20 MG tablet Take 60 mg alternating with 40 mg every other day 07/03/24  Yes Debera Jayson MATSU, MD  Vitamin D , Ergocalciferol , (DRISDOL) 1.25 MG (50000 UNIT) CAPS capsule Take 50,000 Units by mouth once a week. 06/02/22  Yes [provider]    Allergies: Benazepril, Lisinopril, and Ivp dye [iodinated contrast media]    Review of Systems  Respiratory:  Positive for shortness of breath.   Cardiovascular:  Positive for leg swelling.  All other systems reviewed and are negative.   Updated Vital Signs BP (!) 143/61   Pulse (!) 59   Temp  97.8 F (36.6 C) (Oral)   Resp 16   Ht 5' 8 (1.727 m)   Wt 129.9 kg   SpO2 100%   BMI 43.54 kg/m   Physical Exam Vitals and nursing note reviewed.  Constitutional:      General: He is not in acute distress.    Appearance: Normal appearance. He is well-developed. He is not ill-appearing, toxic-appearing or diaphoretic.  HENT:     Head: Normocephalic and atraumatic.     Right Ear: External ear normal.     Left Ear: External ear normal.     Nose: Nose normal.     Mouth/Throat:     Mouth: Mucous membranes are moist.  Eyes:     Extraocular  Movements: Extraocular movements intact.     Conjunctiva/sclera: Conjunctivae normal.  Cardiovascular:     Rate and Rhythm: Normal rate and regular rhythm.  Pulmonary:     Effort: Pulmonary effort is normal. No respiratory distress.  Abdominal:     General: There is no distension.     Palpations: Abdomen is soft.     Tenderness: There is no abdominal tenderness.  Musculoskeletal:        General: No swelling. Normal range of motion.     Cervical back: Normal range of motion and neck supple.     Right lower leg: Edema present.     Left lower leg: Edema present.  Skin:    General: Skin is warm and dry.     Coloration: Skin is not jaundiced or pale.  Neurological:     General: No focal deficit present.     Mental Status: He is alert and oriented to person, place, and time.  Psychiatric:        Mood and Affect: Mood normal.        Behavior: Behavior normal.     (all labs ordered are listed, but only abnormal results are displayed) Labs Reviewed  BASIC METABOLIC PANEL WITH GFR - Abnormal; Notable for the following components:      Result Value   Glucose, Bld 66 (*)    BUN 27 (*)    Creatinine, Ser 1.76 (*)    Calcium  8.7 (*)    GFR, Estimated 38 (*)    All other components within normal limits  CBC - Abnormal; Notable for the following components:   RBC 1.51 (*)    Hemoglobin 4.7 (*)    HCT 15.1 (*)    All other components within normal limits  PRO BRAIN NATRIURETIC PEPTIDE - Abnormal; Notable for the following components:   Pro Brain Natriuretic Peptide 338.0 (*)    All other components within normal limits  CBC - Abnormal; Notable for the following components:   RBC 3.87 (*)    Hemoglobin 11.6 (*)    HCT 37.1 (*)    All other components within normal limits  CBG MONITORING, ED - Abnormal; Notable for the following components:   Glucose-Capillary 38 (*)    All other components within normal limits  CBG MONITORING, ED - Abnormal; Notable for the following components:    Glucose-Capillary 35 (*)    All other components within normal limits  CBG MONITORING, ED - Abnormal; Notable for the following components:   Glucose-Capillary 63 (*)    All other components within normal limits  CBG MONITORING, ED - Abnormal; Notable for the following components:   Glucose-Capillary 109 (*)    All other components within normal limits  MAGNESIUM   POC OCCULT BLOOD, ED  EKG: EKG Interpretation Date/Time:  Monday September 17 2024 09:45:44 EST Ventricular Rate:  54 PR Interval:  249 QRS Duration:  108 QT Interval:  403 QTC Calculation: 382 R Axis:   -69  Text Interpretation: Sinus rhythm Prolonged PR interval Inferior infarct, old Confirmed by Melvenia Motto [id number]) on 09/17/2024 12:35:58 PM  Radiology: DG Chest 2 View Result Date: 09/17/2024 EXAM: 2 VIEW(S) XRAY OF THE CHEST 09/17/2024 10:07:00 AM COMPARISON: 09/15/2024 CLINICAL HISTORY: Occasional Shortness of breath FINDINGS: LUNGS AND PLEURA: No focal pulmonary opacity. No pleural effusion. No pneumothorax. HEART AND MEDIASTINUM: No acute abnormality of the cardiac and mediastinal silhouettes. BONES AND SOFT TISSUES: Sternotomy wires. Surgical clips at the thoracic inlet on the right. No acute osseous abnormality. IMPRESSION: 1. No acute cardiopulmonary process. Electronically signed by: Dayne Hassell MD 09/17/2024 10:42 AM EST RP Workstation: [id number]     Procedures   Medications Ordered in the ED  insulin  aspart (novoLOG ) injection 0-9 Units (has no administration in time range)  insulin  aspart (novoLOG ) injection 0-5 Units (has no administration in time range)  furosemide  (LASIX ) injection 80 mg (has no administration in time range)  potassium chloride  (KLOR-CON ) packet 20 mEq (has no administration in time range)  acetaminophen  (TYLENOL ) tablet 1,000 mg (has no administration in time range)  albuterol  (PROVENTIL ) (2.5 MG/3ML) 0.083% nebulizer solution 3 mL (has no administration in time range)  apixaban   (ELIQUIS ) tablet 2.5 mg (has no administration in time range)  ezetimibe  (ZETIA ) tablet 10 mg (has no administration in time range)  losartan  (COZAAR ) tablet 25 mg (has no administration in time range)  metoprolol  tartrate (LOPRESSOR ) tablet 25 mg (has no administration in time range)  tamsulosin  (FLOMAX ) capsule 0.4 mg (has no administration in time range)  potassium chloride  SA (KLOR-CON  M) CR tablet 40 mEq (40 mEq Oral Given 09/17/24 1143)  furosemide  (LASIX ) injection 80 mg (80 mg Intravenous Given 09/17/24 1143)                                    Medical Decision Making Amount and/or Complexity of Data Reviewed Labs: ordered. Radiology: ordered.  Risk Prescription drug management. Decision regarding hospitalization.   This patient presents to the ED for concern of leg swelling, this involves an extensive number of treatment options, and is a complaint that carries with it a high risk of complications and morbidity.  The differential diagnosis includes hypovolemia, venous insufficiency, DVT, medication nonadherence, failure of outpatient treatment   Co morbidities / Chronic conditions that complicate the patient evaluation  DM, CAD, HTN, HLD, atrial fibrillation, CVA, anemia, CKD   Additional history obtained:  Additional history obtained from EMR External records from outside source obtained and reviewed including N/A   Lab Tests:  I Ordered, and personally interpreted labs.  The pertinent results include: Baseline hemoglobin, no leukocytosis, mild elevation in BNP   Imaging Studies ordered:  I ordered imaging studies including chest x-ray I independently visualized and interpreted imaging which showed no acute findings I agree with the radiologist interpretation   Cardiac Monitoring: / EKG:  The patient was maintained on a cardiac monitor.  I personally viewed and interpreted the cardiac monitored which showed an underlying rhythm of: Sinus rhythm   Problem  List / ED Course / Critical interventions / Medication management  Patient presents for ongoing lower extremity swelling.  This is despite use of diuretics at home.  He was seen in the ED  recently and received IV Lasix  and was encouraged to stay.  He declined at the time.  Due to his ongoing symptoms, patient returns.  In addition to his leg swelling, he has endorses some intermittent shortness of breath.  Currently, his breathing is unlabored.  He does have pitting edema in his lower extremities, greater on the left.  Increased welling on the left leg is baseline for him.  Lab work was initiated.  CBC surprisingly showed a hemoglobin of 4.7.  Patient denies any blood loss in the last 2 days.  He states that his stools been normal in color.  DRE today shows light brown stool that was Hemoccult negative.  I suspect laboratory error.  Repeat CBC was ordered.  Repeat CBC did in fact show baseline hemoglobin.  Dose of IV Lasix  was given in the ED.  Patient was admitted for ongoing diuresis. I ordered medication including potassium chloride  for electrolyte optimization; Lasix  for diuresis Reevaluation of the patient after these medicines showed that the patient stayed the same I have reviewed the patients home medicines and have made adjustments as needed  Social Determinants of Health:  Lives at home with wife      Final diagnoses:  Hypervolemia, unspecified hypervolemia type  Failure of outpatient treatment    ED Discharge Orders     None          Melvenia Motto, MD 09/17/24 1546

## 2024-09-17 NOTE — Telephone Encounter (Signed)
 Second attempt to reach Dr.Bhutani, left message

## 2024-09-18 ENCOUNTER — Observation Stay (HOSPITAL_COMMUNITY)

## 2024-09-18 DIAGNOSIS — Z8673 Personal history of transient ischemic attack (TIA), and cerebral infarction without residual deficits: Secondary | ICD-10-CM | POA: Diagnosis not present

## 2024-09-18 DIAGNOSIS — E8770 Fluid overload, unspecified: Secondary | ICD-10-CM | POA: Diagnosis present

## 2024-09-18 DIAGNOSIS — Z6841 Body Mass Index (BMI) 40.0 and over, adult: Secondary | ICD-10-CM | POA: Diagnosis not present

## 2024-09-18 DIAGNOSIS — E66813 Obesity, class 3: Secondary | ICD-10-CM | POA: Diagnosis present

## 2024-09-18 DIAGNOSIS — N1832 Chronic kidney disease, stage 3b: Secondary | ICD-10-CM | POA: Diagnosis present

## 2024-09-18 DIAGNOSIS — I5031 Acute diastolic (congestive) heart failure: Secondary | ICD-10-CM

## 2024-09-18 DIAGNOSIS — I480 Paroxysmal atrial fibrillation: Secondary | ICD-10-CM | POA: Diagnosis present

## 2024-09-18 DIAGNOSIS — E782 Mixed hyperlipidemia: Secondary | ICD-10-CM | POA: Diagnosis present

## 2024-09-18 DIAGNOSIS — Z951 Presence of aortocoronary bypass graft: Secondary | ICD-10-CM | POA: Diagnosis not present

## 2024-09-18 DIAGNOSIS — Z833 Family history of diabetes mellitus: Secondary | ICD-10-CM | POA: Diagnosis not present

## 2024-09-18 DIAGNOSIS — I130 Hypertensive heart and chronic kidney disease with heart failure and stage 1 through stage 4 chronic kidney disease, or unspecified chronic kidney disease: Secondary | ICD-10-CM | POA: Diagnosis present

## 2024-09-18 DIAGNOSIS — N179 Acute kidney failure, unspecified: Secondary | ICD-10-CM | POA: Diagnosis present

## 2024-09-18 DIAGNOSIS — Z8249 Family history of ischemic heart disease and other diseases of the circulatory system: Secondary | ICD-10-CM | POA: Diagnosis not present

## 2024-09-18 DIAGNOSIS — Z7984 Long term (current) use of oral hypoglycemic drugs: Secondary | ICD-10-CM | POA: Diagnosis not present

## 2024-09-18 DIAGNOSIS — Z91041 Radiographic dye allergy status: Secondary | ICD-10-CM | POA: Diagnosis not present

## 2024-09-18 DIAGNOSIS — I2510 Atherosclerotic heart disease of native coronary artery without angina pectoris: Secondary | ICD-10-CM | POA: Diagnosis present

## 2024-09-18 DIAGNOSIS — Z7985 Long-term (current) use of injectable non-insulin antidiabetic drugs: Secondary | ICD-10-CM | POA: Diagnosis not present

## 2024-09-18 DIAGNOSIS — Z87891 Personal history of nicotine dependence: Secondary | ICD-10-CM | POA: Diagnosis not present

## 2024-09-18 DIAGNOSIS — D631 Anemia in chronic kidney disease: Secondary | ICD-10-CM | POA: Diagnosis present

## 2024-09-18 DIAGNOSIS — I5033 Acute on chronic diastolic (congestive) heart failure: Secondary | ICD-10-CM | POA: Diagnosis present

## 2024-09-18 DIAGNOSIS — G4733 Obstructive sleep apnea (adult) (pediatric): Secondary | ICD-10-CM | POA: Diagnosis present

## 2024-09-18 DIAGNOSIS — Z794 Long term (current) use of insulin: Secondary | ICD-10-CM | POA: Diagnosis not present

## 2024-09-18 DIAGNOSIS — Z79899 Other long term (current) drug therapy: Secondary | ICD-10-CM | POA: Diagnosis not present

## 2024-09-18 DIAGNOSIS — E1122 Type 2 diabetes mellitus with diabetic chronic kidney disease: Secondary | ICD-10-CM | POA: Diagnosis present

## 2024-09-18 DIAGNOSIS — E1151 Type 2 diabetes mellitus with diabetic peripheral angiopathy without gangrene: Secondary | ICD-10-CM | POA: Diagnosis present

## 2024-09-18 DIAGNOSIS — Z7901 Long term (current) use of anticoagulants: Secondary | ICD-10-CM | POA: Diagnosis not present

## 2024-09-18 LAB — URINALYSIS, ROUTINE W REFLEX MICROSCOPIC
Bacteria, UA: NONE SEEN
Bilirubin Urine: NEGATIVE
Glucose, UA: NEGATIVE mg/dL
Ketones, ur: NEGATIVE mg/dL
Leukocytes,Ua: NEGATIVE
Nitrite: NEGATIVE
Protein, ur: NEGATIVE mg/dL
Specific Gravity, Urine: 1.011 (ref 1.005–1.030)
pH: 5 (ref 5.0–8.0)

## 2024-09-18 LAB — BASIC METABOLIC PANEL WITH GFR
Anion gap: 9 (ref 5–15)
BUN: 31 mg/dL — ABNORMAL HIGH (ref 8–23)
CO2: 28 mmol/L (ref 22–32)
Calcium: 8.8 mg/dL — ABNORMAL LOW (ref 8.9–10.3)
Chloride: 102 mmol/L (ref 98–111)
Creatinine, Ser: 1.92 mg/dL — ABNORMAL HIGH (ref 0.61–1.24)
GFR, Estimated: 35 mL/min — ABNORMAL LOW (ref >60)
Glucose, Bld: 113 mg/dL — ABNORMAL HIGH (ref 70–99)
Potassium: 4.6 mmol/L (ref 3.5–5.1)
Sodium: 138 mmol/L (ref 135–145)

## 2024-09-18 LAB — ECHOCARDIOGRAM COMPLETE
AR max vel: 2.91 cm2
AV Area VTI: 4.62 cm2
AV Area mean vel: 3 cm2
AV Mean grad: 1.7 mmHg
AV Peak grad: 3.2 mmHg
Ao pk vel: 0.89 m/s
Area-P 1/2: 2.19 cm2
Height: 68 in
S' Lateral: 3.5 cm
Weight: 4305.6 [oz_av]

## 2024-09-18 LAB — GLUCOSE, CAPILLARY
Glucose-Capillary: 144 mg/dL — ABNORMAL HIGH (ref 70–99)
Glucose-Capillary: 202 mg/dL — ABNORMAL HIGH (ref 70–99)
Glucose-Capillary: 135 mg/dL — ABNORMAL HIGH (ref 70–99)
Glucose-Capillary: 195 mg/dL — ABNORMAL HIGH (ref 70–99)

## 2024-09-18 LAB — PROTEIN / CREATININE RATIO, URINE
Creatinine, Urine: 140 mg/dL
Protein Creatinine Ratio: 0.07 mg/mg{creat} (ref 0.00–0.15)
Total Protein, Urine: 10 mg/dL

## 2024-09-18 LAB — SODIUM, URINE, RANDOM: Sodium, Ur: 31 mmol/L

## 2024-09-18 MED ORDER — PERFLUTREN LIPID MICROSPHERE
1.0000 mL | INTRAVENOUS | Status: AC | PRN
  Administered 2024-09-18: 2 mL via INTRAVENOUS

## 2024-09-18 NOTE — Evaluation (Signed)
 Physical Therapy Evaluation Patient Details Name: [name] MRN: [id number] DOB: 1941/01/03 Today's Date: 09/18/2024  History of Present Illness  [name] is a 83 y.o. male with medical history significant of CKD stage IV with baseline creatinine about 1.7-1.8, type 2 diabetes on insulin , hypertension, hyperlipidemia, current diastolic dysfunction and fluid overload on torsemide  at home, ambulatory dysfunction, paroxysmal A-fib on Eliquis  and metoprolol  presents to the emergency room with about 1 week of worsening leg swelling, gaining weight at home more than 5 pounds.  He has been in touch with his nephrologist Dr. Rachele who had asked to increase his torsemide  from 40 mg daily to alternate 60 and 40 mg every other day however he was unable to make much urine so he called his doctor who asked him to come to the ER.   Patient does not walk show he is not sure he is short of breath.  Denies any orthopnea or PND.  Leg is already swollen, however now it is worse.  Weight is up 5 pounds as measured at home every day.   Clinical Impression  Patient functioning near baseline for functional mobility and gait demonstrating good return for ambulating using RW without loss of balance, had minor difficulty transferring to/from commode and tolerated sitting up at bedside after therapy with spouse present. Patient will benefit from continued skilled physical therapy in hospital and recommended venue below to increase strength, balance, endurance for safe ADLs and gait.          If plan is discharge home, recommend the following: A little help with walking and/or transfers;A little help with bathing/dressing/bathroom;Help with stairs or ramp for entrance;Assistance with cooking/housework   Can travel by private vehicle        Equipment Recommendations None recommended by PT  Recommendations for Other Services       Functional Status Assessment Patient has had a recent decline in their functional  status and demonstrates the ability to make significant improvements in function in a reasonable and predictable amount of time.     Precautions / Restrictions Precautions Precautions: Fall Recall of Precautions/Restrictions: Intact Restrictions Weight Bearing Restrictions Per Provider Order: No      Mobility  Bed Mobility Overal bed mobility: Modified Independent                  Transfers Overall transfer level: Needs assistance Equipment used: Rolling walker (2 wheels) Transfers: Sit to/from Stand, Bed to chair/wheelchair/BSC Sit to Stand: Contact guard assist           General transfer comment: slightly unsteady movement with difficulty transferring to/from commode in bathroom    Ambulation/Gait Ambulation/Gait assistance: Supervision, Contact guard assist Gait Distance (Feet): 40 Feet Assistive device: Rolling walker (2 wheels) Gait Pattern/deviations: Decreased step length - right, Decreased step length - left, Decreased stride length Gait velocity: deceased     General Gait Details: slightly labored movement without loss of balance, limited mostly due to fatigue  Stairs            Wheelchair Mobility     Tilt Bed    Modified Rankin (Stroke Patients Only)       Balance Overall balance assessment: Needs assistance Sitting-balance support: Feet supported, No upper extremity supported Sitting balance-Leahy Scale: Fair Sitting balance - Comments: fair/good seated at EOB   Standing balance support: Bilateral upper extremity supported, During functional activity, Reliant on assistive device for balance Standing balance-Leahy Scale: Fair Standing balance comment: using RW  Pertinent Vitals/Pain Pain Assessment Pain Assessment: No/denies pain    Home Living Family/patient expects to be discharged to:: Private residence Living Arrangements: Spouse/significant other Available Help at Discharge:  Family;Available 24 hours/day;Personal care attendant Type of Home: House Home Access: Stairs to enter Entrance Stairs-Rails: Can reach both;Left;Right Entrance Stairs-Number of Steps: 2   Home Layout: Multi-level;Able to live on main level with bedroom/bathroom Home Equipment: Rolling Walker (2 wheels);Rollator (4 wheels);Cane - single point;BSC/3in1;Shower seat - built in;Wheelchair - power;Wheelchair - manual;Hospital bed Additional Comments: PCA 8AM to 1 PM M-F. Less hours saturday and sunday.    Prior Function Prior Level of Function : Needs assist;History of Falls (last six months)       Physical Assist : ADLs (physical);Mobility (physical) Mobility (physical): Bed mobility;Transfers;Gait;Stairs ADLs (physical): Bathing;Dressing;IADLs Mobility Comments: Short distance household ambulator with RW. Power chair used in the community. ADLs Comments: PCA assists with bathing and dressing when she is present. Pt reports he can do on his own if the PCA is not there. Independent toileting. Assist IADL's. Able to feed and groom.     Extremity/Trunk Assessment   Upper Extremity Assessment Upper Extremity Assessment: Defer to OT evaluation    Lower Extremity Assessment Lower Extremity Assessment: Overall WFL for tasks assessed    Cervical / Trunk Assessment Cervical / Trunk Assessment: Kyphotic  Communication   Communication Communication: No apparent difficulties    Cognition Arousal: Alert Behavior During Therapy: WFL for tasks assessed/performed   PT - Cognitive impairments: No apparent impairments                         Following commands: Intact       Cueing Cueing Techniques: Verbal cues, Tactile cues     General Comments      Exercises     Assessment/Plan    PT Assessment Patient needs continued PT services  PT Problem List Decreased strength;Decreased activity tolerance;Decreased balance;Decreased mobility       PT Treatment Interventions  DME instruction;Gait training;Stair training;Functional mobility training;Therapeutic activities;Therapeutic exercise;Balance training;Patient/family education    PT Goals (Current goals can be found in the Care Plan section)  Acute Rehab PT Goals Patient Stated Goal: return home with family to assist PT Goal Formulation: With patient/family Time For Goal Achievement: 09/21/24 Potential to Achieve Goals: Good    Frequency Min 2X/week     Co-evaluation PT/OT/SLP Co-Evaluation/Treatment: Yes Reason for Co-Treatment: To address functional/ADL transfers PT goals addressed during session: Mobility/safety with mobility;Balance;Proper use of DME OT goals addressed during session: ADL's and self-care       AM-PAC PT 6 Clicks Mobility  Outcome Measure Help needed turning from your back to your side while in a flat bed without using bedrails?: None Help needed moving from lying on your back to sitting on the side of a flat bed without using bedrails?: None Help needed moving to and from a bed to a chair (including a wheelchair)?: A Little Help needed standing up from a chair using your arms (e.g., wheelchair or bedside chair)?: A Little Help needed to walk in hospital room?: A Little Help needed climbing 3-5 steps with a railing? : A Little 6 Click Score: 20    End of Session   Activity Tolerance: Patient tolerated treatment well;Patient limited by fatigue Patient left: in bed;with call bell/phone within reach Nurse Communication: Mobility status PT Visit Diagnosis: Unsteadiness on feet (R26.81);Other abnormalities of gait and mobility (R26.89);Muscle weakness (generalized) (M62.81)    Time: [phone]  PT Time Calculation (min) (ACUTE ONLY): 13 min   Charges:   PT Evaluation $PT Eval Low Complexity: 1 Low PT Treatments $Gait Training: 8-22 mins PT General Charges $$ ACUTE PT VISIT: 1 Visit         1:47 PM, 09/18/24 Lynwood Music, MPT Physical Therapist with  [facility] 336 [phone] office [phone] mobile phone

## 2024-09-18 NOTE — Plan of Care (Signed)

## 2024-09-18 NOTE — Evaluation (Signed)
 Occupational Therapy Evaluation Patient Details Name: [name] MRN: [id number] DOB: 07-Jan-1941 Today's Date: 09/18/2024   History of Present Illness   [name] is a 83 y.o. male with medical history significant of CKD stage IV with baseline creatinine about 1.7-1.8, type 2 diabetes on insulin , hypertension, hyperlipidemia, current diastolic dysfunction and fluid overload on torsemide  at home, ambulatory dysfunction, paroxysmal A-fib on Eliquis  and metoprolol  presents to the emergency room with about 1 week of worsening leg swelling, gaining weight at home more than 5 pounds.  He has been in touch with his nephrologist Dr. Rachele who had asked to increase his torsemide  from 40 mg daily to alternate 60 and 40 mg every other day however he was unable to make much urine so he called his doctor who asked him to come to the ER.   Patient does not walk show he is not sure he is short of breath.  Denies any orthopnea or PND.  Leg is already swollen, however now it is worse.  Weight is up 5 pounds as measured at home every day. (per MD)     Clinical Impressions Pt agreeable to OT and PT co-evaluation. Pt appears to be near baseline function with wife reporting interest in an OT home health safety assessment due to history of falling. Today pt required CGA for safety while ambulating with the RW. Mild loss of balance when standing from the toilet observed today. B UE strength was [facility] today. Pt assisted at baseline with bathing and dressing but was able to slide on his shoes with set up assist. Pt left in the bed with family present. Pt is not recommended for any further acute OT services and will be discharged to care of nursing staff for remaining length of stay.       If plan is discharge home, recommend the following:   A little help with walking and/or transfers;A little help with bathing/dressing/bathroom;Assistance with cooking/housework;Assist for transportation;Help with stairs or ramp  for entrance     Functional Status Assessment   Patient has not had a recent decline in their functional status     Equipment Recommendations   None recommended by OT             Precautions/Restrictions   Precautions Precautions: Fall Recall of Precautions/Restrictions: Intact Restrictions Weight Bearing Restrictions Per Provider Order: No     Mobility Bed Mobility Overal bed mobility: Modified Independent                  Transfers Overall transfer level: Needs assistance Equipment used: Rolling walker (2 wheels) Transfers: Sit to/from Stand, Bed to chair/wheelchair/BSC Sit to Stand: Contact guard assist           General transfer comment: CGA for sit to stand with mild LOB noted when standing from the toilet. Able to ambulate with RW after sit to stand.      Balance Overall balance assessment: Needs assistance Sitting-balance support: No upper extremity supported, Feet supported Sitting balance-Leahy Scale: Fair Sitting balance - Comments: fair to good seated at EOB.   Standing balance support: Bilateral upper extremity supported, During functional activity, Reliant on assistive device for balance Standing balance-Leahy Scale: Fair Standing balance comment: using RW                           ADL either performed or assessed with clinical judgement   ADL Overall ADL's : Needs assistance/impaired  Grooming: Set up;Sitting   Upper Body Bathing: Set up;Sitting   Lower Body Bathing: Set up;Minimal assistance;Sitting/lateral leans   Upper Body Dressing : Set up;Sitting   Lower Body Dressing: Set up;Minimal assistance Lower Body Dressing Details (indicate cue type and reason): Pt able to slide on shoes without issue today seated at the EOB. Toilet Transfer: Contact guard assist;Ambulation;Rolling walker (2 wheels) Toilet Transfer Details (indicate cue type and reason): EOB to toilet and back. Mild LOB when standing from the  toilet with the RW. 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.) Ability to See in Adequate Light: 3 Highly impaired Patient Visual Report: No change from baseline Vision Assessment?: No apparent visual deficits     Perception Perception: Not tested       Praxis Praxis: Not tested       Pertinent Vitals/Pain Pain Assessment Pain Assessment: No/denies pain     Extremity/Trunk Assessment Upper Extremity Assessment Upper Extremity Assessment: Overall WFL for tasks assessed   Lower Extremity Assessment Lower Extremity Assessment: Defer to PT evaluation   Cervical / Trunk Assessment Cervical / Trunk Assessment: Kyphotic   Communication Communication Communication: No apparent difficulties   Cognition Arousal: Alert Behavior During Therapy: WFL for tasks assessed/performed Cognition: No apparent impairments                               Following commands: Intact       Cueing  General Comments   Cueing Techniques: Verbal cues;Tactile cues                 Home Living Family/patient expects to be discharged to:: Private residence Living Arrangements: Spouse/significant other Available Help at Discharge: Family;Available 24 hours/day;Personal care attendant Type of Home: House Home Access: Stairs to enter [employer] of Steps: 2 Entrance Stairs-Rails: Can reach both;Left;Right Home Layout: Multi-level;Able to live on main level with bedroom/bathroom     Bathroom Shower/Tub: Producer, Television/film/video: Standard Bathroom Accessibility: No   Home Equipment: Agricultural Consultant (2 wheels);Rollator (4 wheels);Cane - single point;BSC/3in1;Shower seat - built in;Wheelchair - power;Wheelchair - manual;Hospital bed    Additional Comments: PCA 8AM to 1 PM M-F. Less hours saturday and sunday.      Prior Functioning/Environment Prior Level of Function : Needs assist;History of Falls (last six months)       Physical Assist : ADLs (physical)   ADLs (physical): Bathing;Dressing;IADLs Mobility Comments: Short distance household ambulator with RW. Power chair used in the community. ADLs Comments: PCA assists with bathing and dressing when she is present. Pt reports he can do on his own if the PCA is not there. Independent toileting. Assist IADL's. Able to feed and groom.                            Co-evaluation PT/OT/SLP Co-Evaluation/Treatment: Yes Reason for Co-Treatment: To address functional/ADL transfers   OT goals addressed during session: ADL's and self-care      AM-PAC OT 6 Clicks Daily Activity     Outcome Measure Help from another person eating meals?: None Help from another person taking care of personal grooming?: None Help from another person toileting, which includes using toliet, bedpan,  or urinal?: A Little Help from another person bathing (including washing, rinsing, drying)?: A Little Help from another person to put on and taking off regular upper body clothing?: None Help from another person to put on and taking off regular lower body clothing?: A Little 6 Click Score: 21   End of Session Equipment Utilized During Treatment: Rolling walker (2 wheels)  Activity Tolerance: Patient tolerated treatment well Patient left: with call bell/phone within reach;in bed;with family/visitor present  OT Visit Diagnosis: Unsteadiness on feet (R26.81);History of falling (Z91.81);Muscle weakness (generalized) (M62.81)                Time: 9079-9064 OT Time Calculation (min): 15 min Charges:  OT General Charges $OT Visit: 1 Visit OT Evaluation $OT Eval Low Complexity: 1 Low  Denajah Farias OT, MOT  Jayson Person 09/18/2024, 12:17 PM

## 2024-09-18 NOTE — Progress Notes (Signed)
  Echocardiogram 2D Echocardiogram has been performed.  Tinnie FORBES Gosling RDCS 09/18/2024, 12:03 PM

## 2024-09-18 NOTE — Consult Note (Addendum)
 Reason for Consult: AKI/CKD stage IIIb Referring Physician: Maree, DO  [name] is an 83 y.o. male with a PMH significant for DM, HTN, h/o CVA, CAD s/p CABG, paroxysmal atrial fibrillation, HLD, HFpEF, lymphedema, and CKD stage IIIb who presented to [facility] ED on 09/17/24 with a 2 week history worsening lower extremity edema despite increasing home torsemide  dose from 20 to 60 mg daily.  He also reports a 10lb weight gain over those 2 weeks.  He has some SOB but denies any orthopnea or PND.  In the ED, Temp 97.8, bp 143/61, HR 59, SpO2 100%, weight 129.9kg.  Labs notable for HHgb 11.6, BUN 27, Cr 1.76, gluc 66, BNP 338.  We were consulted to help manage his CKD and volume overload with diuretic management.  The trend in Scr is seen below.  He was started on IV lasix  bid and had 2 liters net negative over the past 24 hours.  He is followed by Dr. Rachele and was seen 2 weeks ago and increased torsemide  as above.  He reports that he had decreased UOP despite the increase in dose prior to admission.   Trend in Creatinine: Creatinine, Ser  Date/Time Value Ref Range Status  09/18/2024 04:23 AM 1.92 (H) 0.61 - 1.24 mg/dL Final  88/82/7974 90:41 AM 1.76 (H) 0.61 - 1.24 mg/dL Final  88/84/7974 90:68 AM 1.98 (H) 0.61 - 1.24 mg/dL Final  89/77/7974 8.21 (H)    06/25/2024 1.76 (H)    12/20/2023 07:04 PM 1.76 (H) 0.61 - 1.24 mg/dL Final  97/81/7974 97:44 AM 1.92 (H) 0.61 - 1.24 mg/dL Final  89/93/7975 89:78 AM 1.81 (H) 0.61 - 1.24 mg/dL Final  92/96/7975 90:47 AM 1.77 (H) 0.61 - 1.24 mg/dL Final  95/98/7975 98:46 PM 1.73 (H) 0.61 - 1.24 mg/dL Final  98/68/7975 87:68 PM 1.66 (H) 0.61 - 1.24 mg/dL Final  89/96/7976 97:76 PM 1.94 (H) 0.61 - 1.24 mg/dL Final  90/72/7976 95:68 AM 1.91 (H) 0.61 - 1.24 mg/dL Final  90/73/7976 95:98 PM 2.28 (H) 0.61 - 1.24 mg/dL Final  90/90/7976 93:79 PM 2.79 (H) 0.61 - 1.24 mg/dL Final  92/86/7976 90:77 AM 2.09 (H) 0.61 - 1.24 mg/dL Final  94/74/7976 88:54 AM 2.32 (H) 0.61 -  1.24 mg/dL Final  90/83/7977 98:60 PM 2.14 (H) 0.61 - 1.24 mg/dL Final  89/84/7978 97:86 PM 1.72 (H) 0.61 - 1.24 mg/dL Final  92/96/7978 93:97 AM 1.51 (H) 0.61 - 1.24 mg/dL Final  92/97/7978 96:88 AM 2.31 (H) 0.61 - 1.24 mg/dL Final  92/98/7978 92:80 PM 2.41 (H) 0.61 - 1.24 mg/dL Final  87/72/7979 87:70 PM 1.74 (H) 0.61 - 1.24 mg/dL Final  91/91/7980 93:62 AM 1.29 (H) 0.61 - 1.24 mg/dL Final  91/92/7980 95:75 AM 1.73 (H) 0.61 - 1.24 mg/dL Final  91/93/7980 92:80 PM 1.66 (H) 0.61 - 1.24 mg/dL Final  94/96/7980 94:85 AM 1.14 0.61 - 1.24 mg/dL Final  94/98/7980 95:64 AM 1.29 (H) 0.61 - 1.24 mg/dL Final  95/70/7980 94:99 AM 1.69 (H) 0.61 - 1.24 mg/dL Final  95/71/7980 91:64 AM 1.66 (H) 0.61 - 1.24 mg/dL Final  95/72/7980 91:75 AM 1.40 (H) 0.61 - 1.24 mg/dL Final  95/73/7980 97:48 PM 1.67 (H) 0.61 - 1.24 mg/dL Final  95/73/7980 88:96 AM 1.49 (H) 0.61 - 1.24 mg/dL Final  95/76/7980 96:61 AM 1.27 (H) 0.61 - 1.24 mg/dL Final  95/77/7980 92:42 AM 1.25 (H) 0.61 - 1.24 mg/dL Final  95/78/7980 91:44 AM 1.26 (H) 0.61 - 1.24 mg/dL Final  87/92/7983 89:65 PM 1.10 0.61 -  1.24 mg/dL Final  94/71/7984 87:89 AM 1.08 0.50 - 1.35 mg/dL Final  94/73/7984 87:97 PM 1.02 0.50 - 1.35 mg/dL Final  98/79/7984 88:57 AM 0.96 0.50 - 1.35 mg/dL Final  89/75/7985 95:54 AM 1.08 0.50 - 1.35 mg/dL Final  89/76/7985 94:62 AM 0.97 0.50 - 1.35 mg/dL Final  89/77/7985 89:40 AM 0.95 0.50 - 1.35 mg/dL Final  92/81/7987 88:79 PM 1.05 0.50 - 1.35 mg/dL Final  91/96/7988 98:66 PM 1.25 0.4 - 1.5 mg/dL Final  93/70/7988 97:85 PM 1.15 0.4 - 1.5 mg/dL Final  98/75/7989 95:87 AM 1.08 0.4 - 1.5 mg/dL Final  98/76/7989 96:54 PM 1.29 0.4 - 1.5 mg/dL Final   Creat  Date/Time Value Ref Range Status  07/15/2020 11:59 AM 1.64 (H) 0.70 - 1.18 mg/dL Final  90/92/7978 87:88 PM 1.73 (H) 0.70 - 1.18 mg/dL Final  94/92/7978 88:41 AM 1.61 (H) 0.70 - 1.18 mg/dL Final  98/93/7978 88:53 AM 1.55 (H) 0.70 - 1.18 mg/dL Final  90/91/7979 89:73 AM  1.59 (H) 0.70 - 1.18 mg/dL Final  91/88/7979 89:58 AM 1.64 (H) 0.70 - 1.18 mg/dL Final  91/92/7979 88:68 AM 1.60 (H) 0.70 - 1.18 mg/dL Final  89/89/7980 90:56 AM 1.33 (H) 0.70 - 1.18 mg/dL Final  92/77/7980 91:56 AM 1.29 (H) 0.70 - 1.18 mg/dL Final  94/86/7980 97:45 PM 1.42 (H) 0.70 - 1.18 mg/dL Final  95/82/7980 88:87 AM 1.41 (H) 0.70 - 1.18 mg/dL Final  87/82/7981 88:48 AM 1.21 (H) 0.70 - 1.18 mg/dL Final  93/86/7981 88:89 AM 1.16 0.70 - 1.18 mg/dL Final  95/86/7981 89:67 AM 1.19 (H) 0.70 - 1.18 mg/dL Final  87/88/7982 89:57 AM 1.21 (H) 0.70 - 1.18 mg/dL Final  90/72/7982 89:75 AM 1.10 0.70 - 1.18 mg/dL Final  90/81/7982 98:86 PM 0.95 0.70 - 1.18 mg/dL Final  91/90/7982 90:70 AM 1.11 0.70 - 1.18 mg/dL Final  97/79/7982 89:64 AM 1.09 0.70 - 1.18 mg/dL Final  87/80/7983 87:76 PM 1.08 0.70 - 1.18 mg/dL Final  90/90/7983 89:47 AM 1.05 0.70 - 1.18 mg/dL Final  93/82/7983 89:45 AM 1.14 0.50 - 1.35 mg/dL Final  87/78/7984 97:56 PM 1.08 0.50 - 1.35 mg/dL Final  93/88/7985 97:47 PM 1.10 0.50 - 1.35 mg/dL Final  96/87/7985 90:94 AM 1.02 0.50 - 1.35 mg/dL Final    PMH:   Past Medical History:  Diagnosis Date   Anemia    C. difficile diarrhea    Chest pain 06/07/2018   CKD (chronic kidney disease), stage III (HCC)    Coronary artery disease    Status post CABG in 2007 [facility])   Diabetes mellitus with stage 1 chronic kidney disease (HCC) 05/20/2011   Dyspnea 02/20/2018   GERD (gastroesophageal reflux disease)    Glaucoma    Hyperlipidemia    Hypertension    Morbid obesity (HCC)    PAF (paroxysmal atrial fibrillation) (HCC)    Peripheral vascular disease    Sleep apnea    Stroke (HCC) 2008, 2014, 2015    PSH:   Past Surgical History:  Procedure Laterality Date   CHOLECYSTECTOMY     CORONARY ARTERY BYPASS GRAFT     CYST EXCISION  12/2023   NO PAST SURGERIES     THYROID  SURGERY      Allergies:  Allergies  Allergen Reactions   Benazepril Other (See  Comments)    Unknown    Lisinopril Other (See Comments)    Unknown    Ivp Dye [Iodinated Contrast Media] Rash    Medications:  Prior to Admission medications   Medication Sig Start Date End Date Taking? Authorizing Provider  acetaminophen  (TYLENOL ) 650 MG CR tablet Take 1,300 mg by mouth every 8 (eight) hours as needed for pain.   Yes [provider]  albuterol  (VENTOLIN  HFA) 108 (90 Base) MCG/ACT inhaler Inhale 1-2 puffs into the lungs every 6 (six) hours as needed for shortness of breath. 03/23/22  Yes [provider]  alum & mag hydroxide-simeth (MAALOX MAX) 400-400-40 MG/5ML suspension Take 15 mLs by mouth as needed for indigestion. 12/20/23  Yes Kommor, [place], MD  apixaban  (ELIQUIS ) 2.5 MG TABS tablet Take 2.5 mg by mouth 2 (two) times daily.   Yes [provider]  diclofenac Sodium (VOLTAREN) 1 % GEL Apply 2 g topically every 6 (six) hours as needed (pain). 04/10/24  Yes [provider]  ezetimibe  (ZETIA ) 10 MG tablet Take 1 tablet (10 mg total) by mouth daily. 07/25/23  Yes Debera Jayson MATSU, MD  glucose-Vitamin C  4-0.006 GM CHEW chewable tablet Chew 4 tablets by mouth as directed. CHEW FOUR TABLETS BY MOUTH AS DIRECTED BY YOUR PROVIDER (REPEAT EVERY 15 MINUTES IF BLOOD SUGAR LESS THAN 70) 03/09/22  Yes [provider]  insulin  aspart protamine- aspart (NOVOLOG  MIX 70/30) (70-30) 100 UNIT/ML injection Inject 0.4 mLs (40 Units total) into the skin 2 (two) times daily with a meal. Patient taking differently: Inject 40-50 Units into the skin See admin instructions. Inject 40 units in the morning and 50 in the evening 07/29/22  Yes Johnson, Clanford L, MD  lidocaine  (LIDODERM ) 5 % Place 1 patch onto the skin daily. 07/09/24  Yes [provider]  losartan  (COZAAR ) 25 MG tablet Take 25 mg by mouth daily. 08/29/24  Yes [provider]  Semaglutide (OZEMPIC, 0.25 OR 0.5 MG/DOSE, [place]) Inject 0.25 mg into the skin once a week. 02/26/22  Yes  [provider]  tamsulosin  (FLOMAX ) 0.4 MG CAPS capsule Take 0.4 mg by mouth every evening.   Yes [provider]  torsemide  (DEMADEX ) 20 MG tablet Take 60 mg alternating with 40 mg every other day 07/03/24  Yes Debera Jayson MATSU, MD  Vitamin D , Ergocalciferol , (DRISDOL) 1.25 MG (50000 UNIT) CAPS capsule Take 50,000 Units by mouth once a week. 06/02/22  Yes [provider]    Inpatient medications:  apixaban   2.5 mg Oral BID   ezetimibe   10 mg Oral Daily   furosemide   80 mg Intravenous BID   insulin  aspart  0-5 Units Subcutaneous QHS   insulin  aspart  0-9 Units Subcutaneous TID WC   losartan   25 mg Oral Daily   metoprolol  tartrate  25 mg Oral BID   potassium chloride   20 mEq Oral BID   tamsulosin   0.4 mg Oral QPM    Discontinued Meds:   Medications Discontinued During This Encounter  Medication Reason   mycophenolate  (CELLCEPT ) 500 MG tablet Patient Preference   traMADol  (ULTRAM ) 50 MG tablet Patient Preference   oxyCODONE -acetaminophen  (PERCOCET/ROXICET) 5-325 MG tablet Patient Preference   oxyCODONE -acetaminophen  (PERCOCET/ROXICET) 5-325 MG tablet Patient Preference   HYDROcodone -acetaminophen  (NORCO/VICODIN) 5-325 MG tablet Patient Preference   heparin  injection 5,000 Units    amLODipine  (NORVASC ) 10 MG tablet Patient Preference   atorvastatin  (LIPITOR) 80 MG tablet Patient Preference   Cyanocobalamin  (VITAMIN B 12) 500 MCG TABS Patient Preference   isosorbide  mononitrate (IMDUR ) 30 MG 24 hr tablet Patient Preference   metoprolol  tartrate (LOPRESSOR ) 50 MG tablet Patient Preference   pioglitazone  (ACTOS ) 30 MG tablet Patient Preference  amLODipine  (NORVASC ) tablet 10 mg    atorvastatin  (LIPITOR) tablet 40 mg    Vitamin B 12 TABS 1,000 mcg    isosorbide  mononitrate (IMDUR ) 24 hr tablet 30 mg    pioglitazone  (ACTOS ) tablet 30 mg     Social History:  reports that he quit smoking about 42 years ago. His smoking use included cigarettes. He started  smoking about 62 years ago. He has a 20 pack-year smoking history. He has never used smokeless tobacco. He reports that he does not drink alcohol  and does not use drugs.  Family History:   Family History  Problem Relation Age of Onset   Diabetes Mother    Heart failure Mother    Hypertension Mother    Diabetes Father    Heart failure Father    Hypertension Father    Diabetes Sister    Heart failure Sister    Hypertension Sister    Hyperlipidemia Sister    Diabetes Brother    Heart failure Brother    Hypertension Brother     Pertinent items are noted in HPI. Weight change:   Intake/Output Summary (Last 24 hours) at 09/18/2024 0935 Last data filed at 09/18/2024 0203 Gross per 24 hour  Intake 480 ml  Output 1940 ml  Net -1460 ml   BP 137/63 (BP Location: Left Arm)   Pulse 61   Temp 98.3 F (36.8 C) (Oral)   Resp 18   Ht 5' 8 (1.727 m)   Wt 122.1 kg   SpO2 98%   BMI 40.92 kg/m  Vitals:   09/17/24 2132 09/18/24 0159 09/18/24 0536 09/18/24 0843  BP: 136/66 (!) 127/55  137/63  Pulse: (!) 57 (!) 58  61  Resp: 20 18  18   Temp: 98.6 F (37 C) 98.3 F (36.8 C)    TempSrc: Oral Oral    SpO2: 97% 96%  98%  Weight:   122.1 kg   Height:         General appearance: alert, cooperative, no distress, and morbidly obese Head: Normocephalic, without obvious abnormality, atraumatic Resp: clear to auscultation bilaterally Cardio: regular rate and rhythm, S1, S2 normal, no murmur, click, rub or gallop GI: soft, non-tender; bowel sounds normal; no masses,  no organomegaly Extremities: edema 1+ pitting edema bilateral lower extremities.   Labs: Basic Metabolic Panel: Recent Labs  Lab 09/15/24 0931 09/17/24 0958 09/18/24 0423  NA 139 140 138  K 3.9 3.7 4.6  CL 100 103 102  CO2 27 27 28   GLUCOSE 135* 66* 113*  BUN 27* 27* 31*  CREATININE 1.98* 1.76* 1.92*  CALCIUM  8.6* 8.7* 8.8*   Liver Function Tests: No results for input(s): AST, ALT, ALKPHOS, BILITOT,  PROT, ALBUMIN in the last 168 hours. No results for input(s): LIPASE, AMYLASE in the last 168 hours. No results for input(s): AMMONIA in the last 168 hours. CBC: Recent Labs  Lab 09/15/24 0931 09/17/24 0958 09/17/24 1053  WBC 4.6 8.2 4.6  HGB 11.2* 4.7* 11.6*  HCT 35.8* 15.1* 37.1*  MCV 97.0 100.0 95.9  PLT 178 161 197   PT/INR: @LABRCNTIP (inr:5) Cardiac Enzymes: )No results for input(s): CKTOTAL, CKMB, CKMBINDEX, TROPONINI in the last 168 hours. CBG: Recent Labs  Lab 09/17/24 1352 09/17/24 1410 09/17/24 1632 09/17/24 2131 09/18/24 0803  GLUCAP 63* 109* 189* 127* 144*    Iron Studies: No results for input(s): IRON, TIBC, TRANSFERRIN, FERRITIN in the last 168 hours.  Xrays/Other Studies: DG Chest 2 View Result Date: 09/17/2024 EXAM: 2 VIEW(S) XRAY  OF THE CHEST 09/17/2024 10:07:00 AM COMPARISON: 09/15/2024 CLINICAL HISTORY: Occasional Shortness of breath FINDINGS: LUNGS AND PLEURA: No focal pulmonary opacity. No pleural effusion. No pneumothorax. HEART AND MEDIASTINUM: No acute abnormality of the cardiac and mediastinal silhouettes. BONES AND SOFT TISSUES: Sternotomy wires. Surgical clips at the thoracic inlet on the right. No acute osseous abnormality. IMPRESSION: 1. No acute cardiopulmonary process. Electronically signed by: Dayne Hassell MD 09/17/2024 10:42 AM EST RP Workstation: [id number]     Assessment/Plan:   Acute on chronic HFpEF - managed by Dr. Debera.  Mr. Deaton denies any change in diet and watches sodium.  10 lb weight gain and worsening edema despite increased torsemide  dose over the past 2 weeks.  Thankfully responding to IV lasix  80 mg bid.  ECHO ordered and being performed today.  Continue with low sodium diet and monitor I's/O's and daily weights.  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   He was recently restarted on losartan  25 mg daily on 08/29/24 by Dr. Rachele due to  proteinuria.  Will continue with losartan  for now, but may need to stop if Scr continues to climb.   Avoid nephrotoxic medications including NSAIDs and iodinated intravenous contrast exposure unless the latter is absolutely indicated.   Preferred narcotic agents for pain control are hydromorphone , fentanyl, and methadone. Morphine  should not be used.  Avoid Baclofen and avoid oral sodium phosphate and magnesium  citrate based laxatives / bowel preps.  Continue strict Input and Output monitoring.  Will monitor the patient closely with you and intervene or adjust therapy as indicated by changes in clinical status/labs  High dose IV lasix  monitored given high risk for worsening renal function and electrolyte abnormalities. Case discussed with Dr. Maree and will continue to follow.  Anemia of CKD stage IIIb - Hgb stable, continue to follow. HTN - Bp stable on current regimen.  No changes for now. DM type 2 - per primary svc Paroxysmal atrial fibrillation - on Eliquis  and metoprolol   Deconditioning - pt reports that he mainly uses a wheelchair at home.  Will need PT/OT evaluation.   Fairy LABOR Windsor Zirkelbach 09/18/2024, 9:35 AM

## 2024-09-18 NOTE — TOC Transition Note (Signed)
 Transition of Care [facility]) - Discharge Note   Patient Details  Name: [name] MRN: [id number] Date of Birth: 1940-11-14  Transition of [facility]) CM/SW Contact:  Sharlyne Stabs, RN Phone Number: 09/18/2024, 1:20 PM   Clinical Narrative:   PT is recommending HHPT. CM at the bedside with patient and his wife. He has a rolling walker, rollator and electric wheelchair to use when away from the home. He is interested in HHPT. CMS choices reviewed. Referral sent in the hub. [facility] with [facility] accepted. MD aware to order. IPCM following.     Barriers to Discharge: Continued Medical Work up  Patient Goals and CMS Choice   Discharge Plan and Services Additional resources added to the After Visit Summary for        [facility] Arranged: PT HH Agency: [facility]) Date HH Agency Contacted: 09/18/24 Time HH Agency Contacted: 1320 Representative spoke with at [facility] Agency: [place]  Social Drivers of Health (SDOH) Interventions SDOH Screenings   Food Insecurity: No Food Insecurity (09/17/2024)  Housing: Low Risk  (09/17/2024)  Transportation Needs: No Transportation Needs (09/17/2024)  Utilities: Not At Risk (09/17/2024)  Alcohol  Screen: Low Risk  (03/07/2020)  Depression (PHQ2-9): High Risk (04/13/2022)  Social Connections: Moderately Integrated (09/17/2024)  Tobacco Use: Medium Risk (09/17/2024)

## 2024-09-18 NOTE — Progress Notes (Signed)
 PROGRESS NOTE    [name]  FMW:[id number] DOB: [birth year] DOA: 09/17/2024 PCP: Darren Monetta RAMAN, MD   Brief Narrative:    [name] is a 83 y.o. male with medical history significant of CKD stage 3b with baseline creatinine about 1.7-1.8, type 2 diabetes on insulin , hypertension, hyperlipidemia, current diastolic dysfunction and fluid overload on torsemide  at home, ambulatory dysfunction, paroxysmal A-fib on Eliquis  and metoprolol  presents to the emergency room with about 1 week of worsening leg swelling, gaining weight at home more than 5 pounds.  He was having his home diuretics adjusted by nephrology, but still was not having improvement in his symptoms.  He has been admitted for volume overload in the setting of acute diastolic heart failure.  He has now been started on IV Lasix  twice daily and is being followed by nephrology for management of his volume status.  Assessment & Plan:   Principal Problem:   Fluid overload Active Problems:   Essential hypertension   DM (diabetes mellitus) type II controlled with renal manifestation (HCC)   PVD (peripheral vascular disease) (HCC)   CAD (coronary artery disease)   Mixed hyperlipidemia   OSA (obstructive sleep apnea)   CKD (chronic kidney disease), stage IV (HCC)   Peripheral edema  Assessment and Plan:   Fluid overload secondary to diastolic heart failure and decreased clearance from the kidneys. Patient on torsemide  60 and 40 alternate day at home.  Gaining weight despite oral medications. -Continue on IV Lasix  80 mg twice daily with good urine output noted, appreciate nephrology evaluation -Plan to check 2D echocardiogram   AKI on CKD stage 3b - Appreciate nephrology evaluation -Maintain on IV Lasix  twice daily for now   Type 2 diabetes on insulin , hypoglycemia-improved: Patient hypoglycemic on presentation.  Keep on sliding scale insulin .  Holding long-acting insulin  until his appetite improves and blood sugar  improved.  No more on Actos .   Essential hypertension: Blood pressures stable.  He is normal on amlodipine  and Imdur ,. Continue losartan , metoprolol .   Chronic lymphedema of the legs: Bilateral TED hose.  Patient does have lymphedema pump at home to use. -PT/OT evaluation   Paroxysmal A-fib: Currently sinus rhythm.  Rate controlled on metoprolol .  Therapeutic on Eliquis .  Plan to check 2D echocardiogram  Morbid obesity, class III -BMI 40.92    DVT prophylaxis:apixaban  Code Status: Full Family Communication: None at bedside Disposition Plan:  Status is: Observation The patient will require care spanning > 2 midnights and should be moved to inpatient because: Need for continued diuresis with IV Lasix .   Consultants:  Nephrology  Procedures:  None  Antimicrobials:  None   Subjective: Patient seen and evaluated today with no new acute complaints or concerns. No acute concerns or events noted overnight.  He continues to diurese well.  Objective: Vitals:   09/17/24 2132 09/18/24 0159 09/18/24 0536 09/18/24 0843  BP: 136/66 (!) 127/55  137/63  Pulse: (!) 57 (!) 58  61  Resp: 20 18  18   Temp: 98.6 F (37 C) 98.3 F (36.8 C)    TempSrc: Oral Oral    SpO2: 97% 96%  98%  Weight:   122.1 kg   Height:        Intake/Output Summary (Last 24 hours) at 09/18/2024 1123 Last data filed at 09/18/2024 0957 Gross per 24 hour  Intake 830 ml  Output 2740 ml  Net -1910 ml   Filed Weights   09/17/24 1436 09/17/24 1527 09/18/24 0536  Weight: 129.9 kg  129.9 kg 122.1 kg    Examination:  General exam: Appears calm and comfortable  Respiratory system: Clear to auscultation. Respiratory effort normal. Cardiovascular system: S1 & S2 heard, RRR.  Gastrointestinal system: Abdomen is soft Central nervous system: Alert and awake Extremities: No edema Skin: No significant lesions noted Psychiatry: Flat affect.    Data Reviewed: I have personally reviewed following labs and  imaging studies  CBC: Recent Labs  Lab 09/15/24 0931 09/17/24 0958 09/17/24 1053  WBC 4.6 8.2 4.6  HGB 11.2* 4.7* 11.6*  HCT 35.8* 15.1* 37.1*  MCV 97.0 100.0 95.9  PLT 178 161 197   Basic Metabolic Panel: Recent Labs  Lab 09/15/24 0931 09/17/24 0958 09/17/24 1043 09/18/24 0423  NA 139 140  --  138  K 3.9 3.7  --  4.6  CL 100 103  --  102  CO2 27 27  --  28  GLUCOSE 135* 66*  --  113*  BUN 27* 27*  --  31*  CREATININE 1.98* 1.76*  --  1.92*  CALCIUM  8.6* 8.7*  --  8.8*  MG  --   --  2.3  --    GFR: Estimated Creatinine Clearance: 38.4 mL/min (A) (by C-G formula based on SCr of 1.92 mg/dL (H)). Liver Function Tests: No results for input(s): AST, ALT, ALKPHOS, BILITOT, PROT, ALBUMIN in the last 168 hours. No results for input(s): LIPASE, AMYLASE in the last 168 hours. No results for input(s): AMMONIA in the last 168 hours. Coagulation Profile: No results for input(s): INR, PROTIME in the last 168 hours. Cardiac Enzymes: No results for input(s): CKTOTAL, CKMB, CKMBINDEX, TROPONINI in the last 168 hours. BNP (last 3 results) Recent Labs    09/15/24 0931 09/17/24 1043  PROBNP 334.0* 338.0*   HbA1C: No results for input(s): HGBA1C in the last 72 hours. CBG: Recent Labs  Lab 09/17/24 1352 09/17/24 1410 09/17/24 1632 09/17/24 2131 09/18/24 0803  GLUCAP 63* 109* 189* 127* 144*   Lipid Profile: No results for input(s): CHOL, HDL, LDLCALC, TRIG, CHOLHDL, LDLDIRECT in the last 72 hours. Thyroid  Function Tests: No results for input(s): TSH, T4TOTAL, FREET4, T3FREE, THYROIDAB in the last 72 hours. Anemia Panel: No results for input(s): VITAMINB12, FOLATE, FERRITIN, TIBC, IRON, RETICCTPCT in the last 72 hours. Sepsis Labs: No results for input(s): PROCALCITON, LATICACIDVEN in the last 168 hours.  No results found for this or any previous visit (from the past 240 hours).       Radiology  Studies: DG Chest 2 View Result Date: 09/17/2024 EXAM: 2 VIEW(S) XRAY OF THE CHEST 09/17/2024 10:07:00 AM COMPARISON: 09/15/2024 CLINICAL HISTORY: Occasional Shortness of breath FINDINGS: LUNGS AND PLEURA: No focal pulmonary opacity. No pleural effusion. No pneumothorax. HEART AND MEDIASTINUM: No acute abnormality of the cardiac and mediastinal silhouettes. BONES AND SOFT TISSUES: Sternotomy wires. Surgical clips at the thoracic inlet on the right. No acute osseous abnormality. IMPRESSION: 1. No acute cardiopulmonary process. Electronically signed by: Dayne Hassell MD 09/17/2024 10:42 AM EST RP Workstation: [id number]        Scheduled Meds:  apixaban   2.5 mg Oral BID   ezetimibe   10 mg Oral Daily   furosemide   80 mg Intravenous BID   insulin  aspart  0-5 Units Subcutaneous QHS   insulin  aspart  0-9 Units Subcutaneous TID WC   losartan   25 mg Oral Daily   metoprolol  tartrate  25 mg Oral BID   potassium chloride   20 mEq Oral BID   tamsulosin   0.4 mg Oral QPM  LOS: 0 days    Time spent: 55 minutes    Amin Fornwalt JONETTA Fairly, DO [facility]  If 7PM-7AM, please contact night-coverage [email or web address] 09/18/2024, 11:23 AM

## 2024-09-18 NOTE — Plan of Care (Signed)
  Problem: Acute Rehab PT Goals(only PT should resolve) Goal: Pt Will Go Supine/Side To Sit Outcome: Progressing Flowsheets (Taken 09/18/2024 1348) Pt will go Supine/Side to Sit:  Independently  with modified independence Goal: Patient Will Transfer Sit To/From Stand Outcome: Progressing Flowsheets (Taken 09/18/2024 1348) Patient will transfer sit to/from stand: with modified independence Goal: Pt Will Transfer Bed To Chair/Chair To Bed Outcome: Progressing Flowsheets (Taken 09/18/2024 1348) Pt will Transfer Bed to Chair/Chair to Bed: with modified independence Goal: Pt Will Ambulate Outcome: Progressing Flowsheets (Taken 09/18/2024 1348) Pt will Ambulate:  50 feet  with modified independence  with supervision  with rolling walker   1:49 PM, 09/18/24 Lynwood Music, MPT Physical Therapist with [facility] [phone] office [phone] mobile phone

## 2024-09-19 DIAGNOSIS — E8770 Fluid overload, unspecified: Secondary | ICD-10-CM | POA: Diagnosis not present

## 2024-09-19 LAB — CBC
HCT: 36 % — ABNORMAL LOW (ref 39.0–52.0)
Hemoglobin: 11.4 g/dL — ABNORMAL LOW (ref 13.0–17.0)
MCH: 29.8 pg (ref 26.0–34.0)
MCHC: 31.7 g/dL (ref 30.0–36.0)
MCV: 94.2 fL (ref 80.0–100.0)
Platelets: 176 K/uL (ref 150–400)
RBC: 3.82 MIL/uL — ABNORMAL LOW (ref 4.22–5.81)
RDW: 13.2 % (ref 11.5–15.5)
WBC: 5.1 K/uL (ref 4.0–10.5)
nRBC: 0 % (ref 0.0–0.2)

## 2024-09-19 LAB — GLUCOSE, CAPILLARY
Glucose-Capillary: 177 mg/dL — ABNORMAL HIGH (ref 70–99)
Glucose-Capillary: 278 mg/dL — ABNORMAL HIGH (ref 70–99)
Glucose-Capillary: 180 mg/dL — ABNORMAL HIGH (ref 70–99)
Glucose-Capillary: 228 mg/dL — ABNORMAL HIGH (ref 70–99)

## 2024-09-19 LAB — RENAL FUNCTION PANEL
Albumin: 3.7 g/dL (ref 3.5–5.0)
Anion gap: 11 (ref 5–15)
BUN: 35 mg/dL — ABNORMAL HIGH (ref 8–23)
CO2: 26 mmol/L (ref 22–32)
Calcium: 9 mg/dL (ref 8.9–10.3)
Chloride: 100 mmol/L (ref 98–111)
Creatinine, Ser: 1.99 mg/dL — ABNORMAL HIGH (ref 0.61–1.24)
GFR, Estimated: 33 mL/min — ABNORMAL LOW (ref >60)
Glucose, Bld: 172 mg/dL — ABNORMAL HIGH (ref 70–99)
Phosphorus: 3.3 mg/dL (ref 2.5–4.6)
Potassium: 4.4 mmol/L (ref 3.5–5.1)
Sodium: 137 mmol/L (ref 135–145)

## 2024-09-19 MED ORDER — TORSEMIDE 20 MG PO TABS
60.0000 mg | ORAL_TABLET | Freq: Once | ORAL | Status: AC
  Administered 2024-09-19: 60 mg via ORAL
  Filled 2024-09-19: qty 3

## 2024-09-19 NOTE — Plan of Care (Signed)

## 2024-09-19 NOTE — Progress Notes (Signed)
 Pt BMI is 40.4. Per guidelines, his BMI is too high for Reds clips

## 2024-09-19 NOTE — Progress Notes (Signed)
 PROGRESS NOTE    Charles [name]  FMW:[id number] DOB: 10-17-1941 DOA: 09/17/2024 PCP: Charles [name], Charles [name]   Brief Narrative:    Charles [name] is [name] 83 y.o. male with medical history significant of CKD stage 3b with baseline creatinine about 1.7-1.8, type 2 diabetes on insulin , hypertension, hyperlipidemia, current diastolic dysfunction and fluid overload on torsemide  at home, ambulatory dysfunction, paroxysmal [name]-fib on Eliquis  and metoprolol  presents to the emergency room with about 1 week of worsening leg swelling, gaining weight at home more than 5 pounds.  He was having his home diuretics adjusted by nephrology, but still was not having improvement in his symptoms.  He has been admitted for volume overload in the setting of acute diastolic heart failure.  He has now been started on IV Lasix  twice daily and is being followed by nephrology for management of his volume status.  Assessment & Plan:   Principal Problem:   Fluid overload Active Problems:   Essential hypertension   DM (diabetes mellitus) type II controlled with renal manifestation (HCC)   PVD (peripheral vascular disease) (HCC)   CAD (coronary artery disease)   Mixed hyperlipidemia   OSA (obstructive sleep apnea)   CKD (chronic kidney disease), stage IV (HCC)   Peripheral edema  Assessment and Plan:   Fluid overload secondary to diastolic heart failure and decreased clearance from the kidneys. Patient on torsemide  60 and 40 alternate day at home.  Gaining weight despite oral medications supposedly due to dietary indiscretion -Plan for dietary consultation today - Discussed case with nephrology with plan to transition IV Lasix  to oral torsemide  starting today.  Anticipate discharge on 60 mg daily if diuresing well on this dose. -2D echocardiogram from 11/18 reviewed with preserved LVEF and grade 1 diastolic dysfunction with no wall motion abnormalities   AKI on CKD stage 3b-stable - Appreciate nephrology  evaluation - Continue on torsemide  as noted above   Type 2 diabetes on insulin , hypoglycemia-improved: Patient hypoglycemic on presentation.  Keep on sliding scale insulin .  Holding long-acting insulin  until his appetite improves and blood sugar improved.  No more on Actos .   Essential hypertension: Blood pressures stable.  He is normal on amlodipine  and Imdur ,. Continue losartan , metoprolol .   Chronic lymphedema of the legs: Bilateral TED hose.  Patient does have lymphedema pump at home to use. -PT/OT evaluation with recommendations for home health on discharge   Paroxysmal [name]-fib: Currently sinus rhythm.  Rate controlled on metoprolol .  Therapeutic on Eliquis .  Plan to check 2D echocardiogram  Morbid obesity, class III -BMI 40.92    DVT prophylaxis:apixaban  Code Status: Full Family Communication: Discussed with wife at bedside 11/19 Disposition Plan:  Status is: Inpatient Remains inpatient appropriate because: Need for ongoing monitoring.   Consultants:  Nephrology  Procedures:  None  Antimicrobials:  None   Subjective: Patient seen and evaluated today with no new acute complaints or concerns. No acute concerns or events noted overnight.  He continues to diurese well on IV Lasix  and appears to be near his baseline weight.  He still continues to have lower extremity edema and will be transition to oral torsemide .  Objective: Vitals:   09/18/24 2000 09/18/24 2132 09/19/24 0533 09/19/24 1000  BP: (!) 131/49 137/60 (!) 150/58 (!) 144/63  Pulse: (!) 57 67 64 (!) 56  Resp:   20 17  Temp: 98.4 F (36.9 C)  98.8 F (37.1 C) 98.2 F (36.8 C)  TempSrc: Oral  Oral Oral  SpO2: 100%  94% 97%  Weight:   120.5 kg   Height:        Intake/Output Summary (Last 24 hours) at 09/19/2024 1043 Last data filed at 09/19/2024 0538 Gross per 24 hour  Intake 480 ml  Output 2550 ml  Net -2070 ml   Filed Weights   09/17/24 1527 09/18/24 0536 09/19/24 0533  Weight: 129.9 kg 122.1 kg  120.5 kg    Examination:  General exam: Appears calm and comfortable  Respiratory system: Clear to auscultation. Respiratory effort normal. Cardiovascular system: S1 & S2 heard, RRR.  Gastrointestinal system: Abdomen is soft Central nervous system: Alert and awake Extremities: Scant bilateral edema. Skin: No significant lesions noted Psychiatry: Flat affect.    Data Reviewed: I have personally reviewed following labs and imaging studies  CBC: Recent Labs  Lab 09/15/24 0931 09/17/24 0958 09/17/24 1053 09/19/24 0428  WBC 4.6 8.2 4.6 5.1  HGB 11.2* 4.7* 11.6* 11.4*  HCT 35.8* 15.1* 37.1* 36.0*  MCV 97.0 100.0 95.9 94.2  PLT 178 161 197 176   Basic Metabolic Panel: Recent Labs  Lab 09/15/24 0931 09/17/24 0958 09/17/24 1043 09/18/24 0423 09/19/24 0428  NA 139 140  --  138 137  K 3.9 3.7  --  4.6 4.4  CL 100 103  --  102 100  CO2 27 27  --  28 26  GLUCOSE 135* 66*  --  113* 172*  BUN 27* 27*  --  31* 35*  CREATININE 1.98* 1.76*  --  1.92* 1.99*  CALCIUM  8.6* 8.7*  --  8.8* 9.0  MG  --   --  2.3  --   --   PHOS  --   --   --   --  3.3   GFR: Estimated Creatinine Clearance: 36.7 mL/min ([name]) (by C-G formula based on SCr of 1.99 mg/dL (H)). Liver Function Tests: Recent Labs  Lab 09/19/24 0428  ALBUMIN 3.7   No results for input(s): LIPASE, AMYLASE in the last 168 hours. No results for input(s): AMMONIA in the last 168 hours. Coagulation Profile: No results for input(s): INR, PROTIME in the last 168 hours. Cardiac Enzymes: No results for input(s): CKTOTAL, CKMB, CKMBINDEX, TROPONINI in the last 168 hours. BNP (last 3 results) Recent Labs    09/15/24 0931 09/17/24 1043  PROBNP 334.0* 338.0*   HbA1C: No results for input(s): HGBA1C in the last 72 hours. CBG: Recent Labs  Lab 09/18/24 0803 09/18/24 1213 09/18/24 1639 09/18/24 2019 09/19/24 0742  GLUCAP 144* 202* 135* 195* 177*   Lipid Profile: No results for input(s): CHOL,  HDL, LDLCALC, TRIG, CHOLHDL, LDLDIRECT in the last 72 hours. Thyroid  Function Tests: No results for input(s): TSH, T4TOTAL, FREET4, T3FREE, THYROIDAB in the last 72 hours. Anemia Panel: No results for input(s): VITAMINB12, FOLATE, FERRITIN, TIBC, IRON, RETICCTPCT in the last 72 hours. Sepsis Labs: No results for input(s): PROCALCITON, LATICACIDVEN in the last 168 hours.  No results found for this or any previous visit (from the past 240 hours).       Radiology Studies: ECHOCARDIOGRAM COMPLETE Result Date: 09/18/2024    ECHOCARDIOGRAM REPORT   Patient Name:   Charles [name] Date of Exam: 09/18/2024 Medical Rec #:  [id number]     Height:       68.0 in Accession #:    [id number]    Weight:       269.1 lb Date of Birth:  [birth year]      BSA:          2.318  m Patient Age:    83 years      BP:           127/55 mmHg Patient Gender: M             HR:           56 bpm. Exam Location:  Charles [name] Procedure: 2D Echo, Cardiac Doppler, Color Doppler and Intracardiac            Opacification Agent (Both Spectral and Color Flow Doppler were            utilized during procedure). Indications:    CHF I50.31  History:        Patient has prior history of Echocardiogram examinations, most                 recent 01/08/2022. Stroke, Arrythmias:Atrial Fibrillation; Risk                 Factors:Hypertension and Dyslipidemia.  Sonographer:    Charles [name] RDCS Referring Phys: [id number] Charles [name] IMPRESSIONS  1. Left ventricular ejection fraction, by estimation, is 55 to 60%. The left ventricle has normal function. Left ventricular endocardial border not optimally defined to evaluate regional wall motion. There is mild left ventricular hypertrophy. Left ventricular diastolic parameters are consistent with Grade I diastolic dysfunction (impaired relaxation).  2. Right ventricular systolic function was not well visualized. The right ventricular size is not well visualized. Tricuspid  regurgitation signal is inadequate for assessing PA pressure.  3. The mitral valve was not well visualized. No evidence of mitral valve regurgitation. No evidence of mitral stenosis.  4. The aortic valve was not well visualized. There is mild calcification of the aortic valve. There is mild thickening of the aortic valve. Aortic valve regurgitation is not visualized. No aortic stenosis is present.  5. Technically difficult study, limited visualization. FINDINGS  Left Ventricle: Left ventricular ejection fraction, by estimation, is 55 to 60%. The left ventricle has normal function. Left ventricular endocardial border not optimally defined to evaluate regional wall motion. The left ventricular internal cavity size was normal in size. There is mild left ventricular hypertrophy. Left ventricular diastolic parameters are consistent with Grade I diastolic dysfunction (impaired relaxation). Normal left ventricular filling pressure. Right Ventricle: The right ventricular size is not well visualized. Right vetricular wall thickness was not well visualized. Right ventricular systolic function was not well visualized. Tricuspid regurgitation signal is inadequate for assessing PA pressure. Left Atrium: Left atrial size was not well visualized. Right Atrium: Right atrial size was not well visualized. Pericardium: There is no evidence of pericardial effusion. Mitral Valve: The mitral valve was not well visualized. No evidence of mitral valve regurgitation. No evidence of mitral valve stenosis. Tricuspid Valve: The tricuspid valve is not well visualized. Tricuspid valve regurgitation is not demonstrated. No evidence of tricuspid stenosis. Aortic Valve: The aortic valve was not well visualized. There is mild calcification of the aortic valve. There is mild thickening of the aortic valve. There is mild aortic valve annular calcification. Aortic valve regurgitation is not visualized. No aortic stenosis is present. Aortic valve mean  gradient measures 1.7 mmHg. Aortic valve peak gradient measures 3.2 mmHg. Aortic valve area, by VTI measures 4.62 cm. Pulmonic Valve: The pulmonic valve was not well visualized. Pulmonic valve regurgitation is not visualized. No evidence of pulmonic stenosis. Aorta: The aortic root and ascending aorta are structurally normal, with no evidence of dilitation. IAS/Shunts: The interatrial septum was not well visualized.  LEFT  VENTRICLE PLAX 2D LVIDd:         5.30 cm   Diastology LVIDs:         3.50 cm   LV e' medial:    5.11 cm/s LV PW:         1.10 cm   LV E/e' medial:  14.4 LV IVS:        1.10 cm   LV e' lateral:   8.16 cm/s LVOT diam:     2.30 cm   LV E/e' lateral: 9.0 LV SV:         60 LV SV Index:   26 LVOT Area:     4.15 cm  LEFT ATRIUM         Index LA diam:    5.10 cm 2.20 cm/m  AORTIC VALVE AV Area (Vmax):    2.91 cm AV Area (Vmean):   3.00 cm AV Area (VTI):     4.62 cm AV Vmax:           89.03 cm/s AV Vmean:          61.254 cm/s AV VTI:            0.129 m AV Peak Grad:      3.2 mmHg AV Mean Grad:      1.7 mmHg LVOT Vmax:         62.30 cm/s LVOT Vmean:        44.300 cm/s LVOT VTI:          0.144 m LVOT/AV VTI ratio: 1.11  AORTA Ao Root diam: 3.60 cm Ao Asc diam:  3.00 cm MITRAL VALVE MV Area (PHT): 2.19 cm    SHUNTS MV Decel Time: 346 msec    Systemic VTI:  0.14 m MV E velocity: 73.40 cm/s  Systemic Diam: 2.30 cm MV [name] velocity: 89.60 cm/s MV E/[name] ratio:  0.82 Charles [name] Electronically signed by Charles [name] Signature Date/Time: 09/18/2024/12:16:16 PM    Final         Scheduled Meds:  apixaban   2.5 mg Oral BID   ezetimibe   10 mg Oral Daily   insulin  aspart  0-5 Units Subcutaneous QHS   insulin  aspart  0-9 Units Subcutaneous TID WC   losartan   25 mg Oral Daily   metoprolol  tartrate  25 mg Oral BID   potassium chloride   20 mEq Oral BID   tamsulosin   0.4 mg Oral QPM   torsemide   60 mg Oral Once     LOS: 1 day    Time spent: 55 minutes    Sabian Kuba JONETTA Fairly, DO [facility]  If 7PM-7AM, please contact night-coverage [email or web address] 09/19/2024, 10:43 AM

## 2024-09-19 NOTE — Progress Notes (Signed)
 Physical Therapy Treatment Patient Details Name: [name] MRN: [id number] DOB: 07/19/1941 Today's Date: 09/19/2024   History of Present Illness [name] is a 83 y.o. male with medical history significant of CKD stage IV with baseline creatinine about 1.7-1.8, type 2 diabetes on insulin , hypertension, hyperlipidemia, current diastolic dysfunction and fluid overload on torsemide  at home, ambulatory dysfunction, paroxysmal A-fib on Eliquis  and metoprolol  presents to the emergency room with about 1 week of worsening leg swelling, gaining weight at home more than 5 pounds.  He has been in touch with his nephrologist Dr. Rachele who had asked to increase his torsemide  from 40 mg daily to alternate 60 and 40 mg every other day however he was unable to make much urine so he called his doctor who asked him to come to the ER.   Patient does not walk show he is not sure he is short of breath.  Denies any orthopnea or PND.  Leg is already swollen, however now it is worse.  Weight is up 5 pounds as measured at home every day.    PT Comments  Pt. Presented w/ general weakness in LE, and B LE. Pt tolerated session well, and was motivated. Pt was in chair, was able to perform warm-ups to continue to improve strength, and increase blood flow. Pt was able to perform a sit to stand transfer w/ RW, and ambulate into the hallway w/ CGA/ Supervision. Towards the end of treatment, felt a mild fatigue. Pt. Was left in chair w/ family member and call bell at beside. Nursing staff was notified on pt. Status. Patient will benefit from continued skilled physical therapy in hospital and recommended venue below to increase strength, balance, endurance for safe ADLs and gait.     If plan is discharge home, recommend the following: A little help with walking and/or transfers;A little help with bathing/dressing/bathroom;Help with stairs or ramp for entrance;Assistance with cooking/housework   Can travel by private vehicle       Yes  Equipment Recommendations  None recommended by PT    Recommendations for Other Services       Precautions / Restrictions Precautions Precautions: Fall Recall of Precautions/Restrictions: Intact Restrictions Weight Bearing Restrictions Per Provider Order: No     Mobility  Bed Mobility                    Transfers Overall transfer level: Needs assistance Equipment used: Rolling walker (2 wheels) Transfers: Sit to/from Stand, Bed to chair/wheelchair/BSC Sit to Stand: Contact guard assist, Supervision   Step pivot transfers: Contact guard assist, Supervision       General transfer comment: w/ RW    Ambulation/Gait Ambulation/Gait assistance: Supervision, Contact guard assist Gait Distance (Feet): 45 Feet Assistive device: Rolling walker (2 wheels) Gait Pattern/deviations: Decreased step length - right, Decreased step length - left, Decreased stride length, Wide base of support       General Gait Details: labored ambulation w/ Rw, pt had wide BoS during ambulation and decreased gait speed, but no loss of balance   Stairs             Wheelchair Mobility     Tilt Bed    Modified Rankin (Stroke Patients Only)       Balance   Sitting-balance support: Feet supported, No upper extremity supported Sitting balance-Leahy Scale: Good Sitting balance - Comments: good in chair   Standing balance support: Bilateral upper extremity supported, During functional activity, Reliant on assistive device for  balance Standing balance-Leahy Scale: Good Standing balance comment: Good/fair w/ RW                            Communication Communication Communication: No apparent difficulties  Cognition Arousal: Alert Behavior During Therapy: WFL for tasks assessed/performed   PT - Cognitive impairments: No apparent impairments                         Following commands: Intact      Cueing Cueing Techniques: Verbal cues, Tactile  cues  Exercises General Exercises - Lower Extremity Long Arc Quad: Seated, AROM, Strengthening, Both, 10 reps Hip Flexion/Marching: Seated, AROM, Strengthening, Both, 10 reps Toe Raises: Seated, AROM, Strengthening, Both, 10 reps Heel Raises: Seated, AROM, Strengthening, Both, 10 reps    General Comments General comments (skin integrity, edema, etc.): B LE swelling      Pertinent Vitals/Pain Pain Assessment Pain Assessment: No/denies pain    Home Living                          Prior Function            PT Goals (current goals can now be found in the care plan section) Acute Rehab PT Goals Patient Stated Goal: return home with family to assist PT Goal Formulation: With patient/family Time For Goal Achievement: 09/21/24 Potential to Achieve Goals: Good Progress towards PT goals: Progressing toward goals    Frequency    Min 2X/week      PT Plan      Co-evaluation              AM-PAC PT 6 Clicks Mobility   Outcome Measure  Help needed turning from your back to your side while in a flat bed without using bedrails?: None Help needed moving from lying on your back to sitting on the side of a flat bed without using bedrails?: None Help needed moving to and from a bed to a chair (including a wheelchair)?: A Little Help needed standing up from a chair using your arms (e.g., wheelchair or bedside chair)?: A Little Help needed to walk in hospital room?: A Little Help needed climbing 3-5 steps with a railing? : A Little 6 Click Score: 20    End of Session   Activity Tolerance: Patient tolerated treatment well;Patient limited by fatigue Patient left: in bed;with call bell/phone within reach Nurse Communication: Mobility status PT Visit Diagnosis: Unsteadiness on feet (R26.81);Other abnormalities of gait and mobility (R26.89);Muscle weakness (generalized) (M62.81)     Time: 8574-8548 PT Time Calculation (min) (ACUTE ONLY): 26 min  Charges:     $Gait Training: 8-22 mins $Therapeutic Exercise: 8-22 mins PT General Charges $$ ACUTE PT VISIT: 1 Visit                      Lilymarie Scroggins, SPT

## 2024-09-19 NOTE — Plan of Care (Signed)
  Problem: Education: Goal: Knowledge of General Education information will improve Description: Including pain rating scale, medication(s)/side effects and non-pharmacologic comfort measures Outcome: Progressing   Problem: Health Behavior/Discharge Planning: Goal: Ability to manage health-related needs will improve Outcome: Progressing   Problem: Clinical Measurements: Goal: Respiratory complications will improve Outcome: Progressing   Problem: Coping: Goal: Level of anxiety will decrease Outcome: Progressing   Problem: Elimination: Goal: Will not experience complications related to urinary retention Outcome: Progressing

## 2024-09-19 NOTE — Progress Notes (Signed)
 Patient ID: [name], male   DOB: 1941-05-19, 83 y.o.   MRN: [id number] S: No new complaints.  Good response to IV lasix  yesterday O:BP (!) 150/58 (BP Location: Right Arm)   Pulse 64   Temp 98.8 F (37.1 C) (Oral)   Resp 20   Ht 5' 8 (1.727 m)   Wt 120.5 kg   SpO2 94%   BMI 40.39 kg/m   Intake/Output Summary (Last 24 hours) at 09/19/2024 1000 Last data filed at 09/19/2024 0538 Gross per 24 hour  Intake 480 ml  Output 2550 ml  Net -2070 ml   Intake/Output: I/O last 3 completed shifts: In: 1070 [P.O.:1070] Out: 4750 [Urine:4750]  Intake/Output this shift:  No intake/output data recorded. Weight change: -1.518 kg Gen: NAD, sitting up in chair CVS: RRR Resp:CTA Abd: obese, +BS, soft, NT/ND Ext: 1+ ankle edema L>R, trace pretibial edema BLE  Recent Labs  Lab 09/15/24 0931 09/17/24 0958 09/18/24 0423 09/19/24 0428  NA 139 140 138 137  K 3.9 3.7 4.6 4.4  CL 100 103 102 100  CO2 27 27 28 26   GLUCOSE 135* 66* 113* 172*  BUN 27* 27* 31* 35*  CREATININE 1.98* 1.76* 1.92* 1.99*  ALBUMIN  --   --   --  3.7  CALCIUM  8.6* 8.7* 8.8* 9.0  PHOS  --   --   --  3.3   Liver Function Tests: Recent Labs  Lab 09/19/24 0428  ALBUMIN 3.7   No results for input(s): LIPASE, AMYLASE in the last 168 hours. No results for input(s): AMMONIA in the last 168 hours. CBC: Recent Labs  Lab 09/15/24 0931 09/17/24 0958 09/17/24 1053 09/19/24 0428  WBC 4.6 8.2 4.6 5.1  HGB 11.2* 4.7* 11.6* 11.4*  HCT 35.8* 15.1* 37.1* 36.0*  MCV 97.0 100.0 95.9 94.2  PLT 178 161 197 176   Cardiac Enzymes: No results for input(s): CKTOTAL, CKMB, CKMBINDEX, TROPONINI in the last 168 hours. CBG: Recent Labs  Lab 09/18/24 0803 09/18/24 1213 09/18/24 1639 09/18/24 2019 09/19/24 0742  GLUCAP 144* 202* 135* 195* 177*    Iron Studies: No results for input(s): IRON, TIBC, TRANSFERRIN, FERRITIN in the last 72 hours. Studies/Results: ECHOCARDIOGRAM COMPLETE Result Date:  09/18/2024    ECHOCARDIOGRAM REPORT   Patient Name:   [name] Date of Exam: 09/18/2024 Medical Rec #:  [id number]     Height:       68.0 in Accession #:    [id number]    Weight:       269.1 lb Date of Birth:  1941/09/11      BSA:          2.318 m Patient Age:    83 years      BP:           127/55 mmHg Patient Gender: M             HR:           56 bpm. Exam Location:  [facility] Procedure: 2D Echo, Cardiac Doppler, Color Doppler and Intracardiac            Opacification Agent (Both Spectral and Color Flow Doppler were            utilized during procedure). Indications:    CHF I50.31  History:        Patient has prior history of Echocardiogram examinations, most                 recent 01/08/2022.  Stroke, Arrythmias:Atrial Fibrillation; Risk                 Factors:Hypertension and Dyslipidemia.  Sonographer:    Tinnie Gosling RDCS Referring Phys: [id number] PRATIK D [facility] IMPRESSIONS  1. Left ventricular ejection fraction, by estimation, is 55 to 60%. The left ventricle has normal function. Left ventricular endocardial border not optimally defined to evaluate regional wall motion. There is mild left ventricular hypertrophy. Left ventricular diastolic parameters are consistent with Grade I diastolic dysfunction (impaired relaxation).  2. Right ventricular systolic function was not well visualized. The right ventricular size is not well visualized. Tricuspid regurgitation signal is inadequate for assessing PA pressure.  3. The mitral valve was not well visualized. No evidence of mitral valve regurgitation. No evidence of mitral stenosis.  4. The aortic valve was not well visualized. There is mild calcification of the aortic valve. There is mild thickening of the aortic valve. Aortic valve regurgitation is not visualized. No aortic stenosis is present.  5. Technically difficult study, limited visualization. FINDINGS  Left Ventricle: Left ventricular ejection fraction, by estimation, is 55 to 60%. The left ventricle has  normal function. Left ventricular endocardial border not optimally defined to evaluate regional wall motion. The left ventricular internal cavity size was normal in size. There is mild left ventricular hypertrophy. Left ventricular diastolic parameters are consistent with Grade I diastolic dysfunction (impaired relaxation). Normal left ventricular filling pressure. Right Ventricle: The right ventricular size is not well visualized. Right vetricular wall thickness was not well visualized. Right ventricular systolic function was not well visualized. Tricuspid regurgitation signal is inadequate for assessing PA pressure. Left Atrium: Left atrial size was not well visualized. Right Atrium: Right atrial size was not well visualized. Pericardium: There is no evidence of pericardial effusion. Mitral Valve: The mitral valve was not well visualized. No evidence of mitral valve regurgitation. No evidence of mitral valve stenosis. Tricuspid Valve: The tricuspid valve is not well visualized. Tricuspid valve regurgitation is not demonstrated. No evidence of tricuspid stenosis. Aortic Valve: The aortic valve was not well visualized. There is mild calcification of the aortic valve. There is mild thickening of the aortic valve. There is mild aortic valve annular calcification. Aortic valve regurgitation is not visualized. No aortic stenosis is present. Aortic valve mean gradient measures 1.7 mmHg. Aortic valve peak gradient measures 3.2 mmHg. Aortic valve area, by VTI measures 4.62 cm. Pulmonic Valve: The pulmonic valve was not well visualized. Pulmonic valve regurgitation is not visualized. No evidence of pulmonic stenosis. Aorta: The aortic root and ascending aorta are structurally normal, with no evidence of dilitation. IAS/Shunts: The interatrial septum was not well visualized.  LEFT VENTRICLE PLAX 2D LVIDd:         5.30 cm   Diastology LVIDs:         3.50 cm   LV e' medial:    5.11 cm/s LV PW:         1.10 cm   LV E/e'  medial:  14.4 LV IVS:        1.10 cm   LV e' lateral:   8.16 cm/s LVOT diam:     2.30 cm   LV E/e' lateral: 9.0 LV SV:         60 LV SV Index:   26 LVOT Area:     4.15 cm  LEFT ATRIUM         Index LA diam:    5.10 cm 2.20 cm/m  AORTIC VALVE AV Area (Vmax):  2.91 cm AV Area (Vmean):   3.00 cm AV Area (VTI):     4.62 cm AV Vmax:           89.03 cm/s AV Vmean:          61.254 cm/s AV VTI:            0.129 m AV Peak Grad:      3.2 mmHg AV Mean Grad:      1.7 mmHg LVOT Vmax:         62.30 cm/s LVOT Vmean:        44.300 cm/s LVOT VTI:          0.144 m LVOT/AV VTI ratio: 1.11  AORTA Ao Root diam: 3.60 cm Ao Asc diam:  3.00 cm MITRAL VALVE MV Area (PHT): 2.19 cm    SHUNTS MV Decel Time: 346 msec    Systemic VTI:  0.14 m MV E velocity: 73.40 cm/s  Systemic Diam: 2.30 cm MV A velocity: 89.60 cm/s MV E/A ratio:  0.82 Dorn Ross MD Electronically signed by Dorn Ross MD Signature Date/Time: 09/18/2024/12:16:16 PM    Final    DG Chest 2 View Result Date: 09/17/2024 EXAM: 2 VIEW(S) XRAY OF THE CHEST 09/17/2024 10:07:00 AM COMPARISON: 09/15/2024 CLINICAL HISTORY: Occasional Shortness of breath FINDINGS: LUNGS AND PLEURA: No focal pulmonary opacity. No pleural effusion. No pneumothorax. HEART AND MEDIASTINUM: No acute abnormality of the cardiac and mediastinal silhouettes. BONES AND SOFT TISSUES: Sternotomy wires. Surgical clips at the thoracic inlet on the right. No acute osseous abnormality. IMPRESSION: 1. No acute cardiopulmonary process. Electronically signed by: Dayne Hassell MD 09/17/2024 10:42 AM EST RP Workstation: [id number]    apixaban   2.5 mg Oral BID   ezetimibe   10 mg Oral Daily   furosemide   80 mg Intravenous BID   insulin  aspart  0-5 Units Subcutaneous QHS   insulin  aspart  0-9 Units Subcutaneous TID WC   losartan   25 mg Oral Daily   metoprolol  tartrate  25 mg Oral BID   potassium chloride   20 mEq Oral BID   tamsulosin   0.4 mg Oral QPM    BMET    Component Value Date/Time   NA  137 09/19/2024 0428   K 4.4 09/19/2024 0428   CL 100 09/19/2024 0428   CO2 26 09/19/2024 0428   GLUCOSE 172 (H) 09/19/2024 0428   BUN 35 (H) 09/19/2024 0428   CREATININE 1.99 (H) 09/19/2024 0428   CREATININE 1.64 (H) 07/15/2020 1159   CALCIUM  9.0 09/19/2024 0428   CALCIUM  8.6 05/04/2023 0952   GFRNONAA 33 (L) 09/19/2024 0428   GFRNONAA 40 (L) 07/15/2020 1159   GFRAA 46 (L) 07/15/2020 1159   CBC    Component Value Date/Time   WBC 5.1 09/19/2024 0428   RBC 3.82 (L) 09/19/2024 0428   HGB 11.4 (L) 09/19/2024 0428   HCT 36.0 (L) 09/19/2024 0428   PLT 176 09/19/2024 0428   MCV 94.2 09/19/2024 0428   MCH 29.8 09/19/2024 0428   MCHC 31.7 09/19/2024 0428   RDW 13.2 09/19/2024 0428   LYMPHSABS 1.3 12/20/2023 0255   MONOABS 0.8 12/20/2023 0255   EOSABS 0.1 12/20/2023 0255   BASOSABS 0.0 12/20/2023 0255      Assessment/Plan:   Acute on chronic HFpEF - managed by Dr. Debera.  Mr. Cappuccio denies any change in diet and watches sodium.  10 lb weight gain and worsening edema despite increased torsemide  dose over the past 2 weeks.  Thankfully responding to IV lasix   80 mg bid.  ECHO with preserved EF and grade I DD but unable to assess RV function.  Continue with low sodium diet and monitor I's/O's and daily weights.  Continue with IV lasix  80 mg bid for now and consider switching to po torsemide  tomorrow since he is nearing his edw.  Will need dietary education on low sodium diet.  Will order diet consult.  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   He was recently restarted on losartan  25 mg daily on 08/29/24 by Dr. Rachele due to proteinuria.  Will continue with losartan  for now, but may need to stop if Scr continues to climb.  Responding well, no changes for now.   Avoid nephrotoxic medications including NSAIDs and iodinated intravenous contrast exposure unless the latter is absolutely indicated.   Preferred narcotic agents for pain control  are hydromorphone , fentanyl, and methadone. Morphine  should not be used.  Avoid Baclofen and avoid oral sodium phosphate and magnesium  citrate based laxatives / bowel preps.  Continue strict Input and Output monitoring.  Will monitor the patient closely with you and intervene or adjust therapy as indicated by changes in clinical status/labs  High dose IV lasix  monitored given high risk for worsening renal function and electrolyte abnormalities. Case discussed with Dr. Maree and will continue to follow.  Anemia of CKD stage IIIb - Hgb stable, continue to follow. HTN - Bp stable on current regimen.  No changes for now. DM type 2 - per primary svc Paroxysmal atrial fibrillation - on Eliquis  and metoprolol   Deconditioning - pt reports that he mainly uses a wheelchair at home.  Will need PT/OT evaluation.  Fairy RONAL Sellar, MD [facility]

## 2024-09-20 DIAGNOSIS — E8770 Fluid overload, unspecified: Secondary | ICD-10-CM | POA: Diagnosis not present

## 2024-09-20 LAB — RENAL FUNCTION PANEL
Albumin: 3.7 g/dL (ref 3.5–5.0)
Anion gap: 13 (ref 5–15)
BUN: 40 mg/dL — ABNORMAL HIGH (ref 8–23)
CO2: 25 mmol/L (ref 22–32)
Calcium: 9 mg/dL (ref 8.9–10.3)
Chloride: 98 mmol/L (ref 98–111)
Creatinine, Ser: 2.02 mg/dL — ABNORMAL HIGH (ref 0.61–1.24)
GFR, Estimated: 33 mL/min — ABNORMAL LOW (ref >60)
Glucose, Bld: 180 mg/dL — ABNORMAL HIGH (ref 70–99)
Phosphorus: 3.9 mg/dL (ref 2.5–4.6)
Potassium: 4.5 mmol/L (ref 3.5–5.1)
Sodium: 136 mmol/L (ref 135–145)

## 2024-09-20 LAB — MAGNESIUM: Magnesium: 2.3 mg/dL (ref 1.7–2.4)

## 2024-09-20 LAB — GLUCOSE, CAPILLARY: Glucose-Capillary: 203 mg/dL — ABNORMAL HIGH (ref 70–99)

## 2024-09-20 MED ORDER — METOPROLOL TARTRATE 50 MG PO TABS: 25.0000 mg | ORAL_TABLET | Freq: Two times a day (BID) | ORAL | 0 refills | Status: AC

## 2024-09-20 MED ORDER — TORSEMIDE 20 MG PO TABS: 60.0000 mg | ORAL_TABLET | Freq: Every day | ORAL | 3 refills | Status: DC

## 2024-09-20 NOTE — TOC Transition Note (Signed)
 Transition of Care [facility]) - Discharge Note   Patient Details  Name: [name] MRN: [id number] Date of Birth: [birth year]  Transition of Care [facility]) CM/SW Contact:  Sharlyne Stabs, RN Phone Number: 09/20/2024, 9:36 AM   Clinical Narrative:   Patient discharging home with [facility], orders placed, [place] updated.    Final next level of care: Home w Home Health Services Barriers to Discharge: Barriers Resolved   Patient Goals and CMS Choice Patient states their goals for this hospitalization and ongoing recovery are:: Return home CMS Medicare.gov Compare Post Acute Care list provided to:: Patient Choice offered to / list presented to : Patient [place] ownership interest in [facility].provided to:: Patient    Discharge Placement          Patient and family notified of of transfer: 09/20/24  Discharge Plan and Services Additional resources added to the After Visit Summary for         [facility] Arranged: PT [facility] Agency: [facility] Date HH Agency Contacted: 09/18/24 Time HH Agency Contacted: 1320 Representative spoke with at [facility] Agency: [facility]  Social Drivers of Health (SDOH) Interventions SDOH Screenings   Food Insecurity: No Food Insecurity (09/17/2024)  Housing: Low Risk  (09/17/2024)  Transportation Needs: No Transportation Needs (09/17/2024)  Utilities: Not At Risk (09/17/2024)  Alcohol  Screen: Low Risk  (03/07/2020)  Depression (PHQ2-9): High Risk (04/13/2022)  Social Connections: Moderately Integrated (09/17/2024)  Tobacco Use: Medium Risk (09/17/2024)     Readmission Risk Interventions    09/20/2024    9:36 AM  Readmission Risk Prevention Plan  Transportation Screening Complete  PCP or Specialist Appt within 5-7 Days Complete  Home Care Screening Complete  Medication Review (RN CM) Complete

## 2024-09-20 NOTE — Progress Notes (Signed)
 Patient ID: [name], male   DOB: [birth year], 83 y.o.   MRN: [id number] S: Feels better, no new complaints. O:BP 132/62   Pulse 62   Temp 97.6 F (36.4 C) (Oral)   Resp 20   Ht 5' 8 (1.727 m)   Wt 120.3 kg   SpO2 98%   BMI 40.33 kg/m   Intake/Output Summary (Last 24 hours) at 09/20/2024 0900 Last data filed at 09/19/2024 2135 Gross per 24 hour  Intake --  Output 700 ml  Net -700 ml   Intake/Output: I/O last 3 completed shifts: In: -  Out: 2250 [Urine:2250]  Intake/Output this shift:  No intake/output data recorded. Weight change: -0.2 kg Gen: NAD CVS: RRR Resp:CTA Abd: obese, +BS, soft, NT Ext: trace ankle edema L>R  Recent Labs  Lab 09/15/24 0931 09/17/24 0958 09/18/24 0423 09/19/24 0428 09/20/24 0433  NA 139 140 138 137 136  K 3.9 3.7 4.6 4.4 4.5  CL 100 103 102 100 98  CO2 27 27 28 26 25   GLUCOSE 135* 66* 113* 172* 180*  BUN 27* 27* 31* 35* 40*  CREATININE 1.98* 1.76* 1.92* 1.99* 2.02*  ALBUMIN  --   --   --  3.7 3.7  CALCIUM  8.6* 8.7* 8.8* 9.0 9.0  PHOS  --   --   --  3.3 3.9   Liver Function Tests: Recent Labs  Lab 09/19/24 0428 09/20/24 0433  ALBUMIN 3.7 3.7   No results for input(s): LIPASE, AMYLASE in the last 168 hours. No results for input(s): AMMONIA in the last 168 hours. CBC: Recent Labs  Lab 09/15/24 0931 09/17/24 0958 09/17/24 1053 09/19/24 0428  WBC 4.6 8.2 4.6 5.1  HGB 11.2* 4.7* 11.6* 11.4*  HCT 35.8* 15.1* 37.1* 36.0*  MCV 97.0 100.0 95.9 94.2  PLT 178 161 197 176   Cardiac Enzymes: No results for input(s): CKTOTAL, CKMB, CKMBINDEX, TROPONINI in the last 168 hours. CBG: Recent Labs  Lab 09/19/24 0742 09/19/24 1104 09/19/24 1622 09/19/24 2008 09/20/24 0729  GLUCAP 177* 278* 180* 228* 203*    Iron Studies: No results for input(s): IRON, TIBC, TRANSFERRIN, FERRITIN in the last 72 hours. Studies/Results: ECHOCARDIOGRAM COMPLETE Result Date: 09/18/2024    ECHOCARDIOGRAM REPORT   Patient  Name:   [name] Date of Exam: 09/18/2024 Medical Rec #:  [id number]     Height:       68.0 in Accession #:    [id number]    Weight:       269.1 lb Date of Birth:  April 24, 1941      BSA:          2.318 m Patient Age:    83 years      BP:           127/55 mmHg Patient Gender: M             HR:           56 bpm. Exam Location:  [facility] Procedure: 2D Echo, Cardiac Doppler, Color Doppler and Intracardiac            Opacification Agent (Both Spectral and Color Flow Doppler were            utilized during procedure). Indications:    CHF I50.31  History:        Patient has prior history of Echocardiogram examinations, most                 recent 01/08/2022. Stroke, Arrythmias:Atrial Fibrillation; Risk  Factors:Hypertension and Dyslipidemia.  Sonographer:    Tinnie Gosling RDCS Referring Phys: [id number] PRATIK D [facility] IMPRESSIONS  1. Left ventricular ejection fraction, by estimation, is 55 to 60%. The left ventricle has normal function. Left ventricular endocardial border not optimally defined to evaluate regional wall motion. There is mild left ventricular hypertrophy. Left ventricular diastolic parameters are consistent with Grade I diastolic dysfunction (impaired relaxation).  2. Right ventricular systolic function was not well visualized. The right ventricular size is not well visualized. Tricuspid regurgitation signal is inadequate for assessing PA pressure.  3. The mitral valve was not well visualized. No evidence of mitral valve regurgitation. No evidence of mitral stenosis.  4. The aortic valve was not well visualized. There is mild calcification of the aortic valve. There is mild thickening of the aortic valve. Aortic valve regurgitation is not visualized. No aortic stenosis is present.  5. Technically difficult study, limited visualization. FINDINGS  Left Ventricle: Left ventricular ejection fraction, by estimation, is 55 to 60%. The left ventricle has normal function. Left ventricular endocardial  border not optimally defined to evaluate regional wall motion. The left ventricular internal cavity size was normal in size. There is mild left ventricular hypertrophy. Left ventricular diastolic parameters are consistent with Grade I diastolic dysfunction (impaired relaxation). Normal left ventricular filling pressure. Right Ventricle: The right ventricular size is not well visualized. Right vetricular wall thickness was not well visualized. Right ventricular systolic function was not well visualized. Tricuspid regurgitation signal is inadequate for assessing PA pressure. Left Atrium: Left atrial size was not well visualized. Right Atrium: Right atrial size was not well visualized. Pericardium: There is no evidence of pericardial effusion. Mitral Valve: The mitral valve was not well visualized. No evidence of mitral valve regurgitation. No evidence of mitral valve stenosis. Tricuspid Valve: The tricuspid valve is not well visualized. Tricuspid valve regurgitation is not demonstrated. No evidence of tricuspid stenosis. Aortic Valve: The aortic valve was not well visualized. There is mild calcification of the aortic valve. There is mild thickening of the aortic valve. There is mild aortic valve annular calcification. Aortic valve regurgitation is not visualized. No aortic stenosis is present. Aortic valve mean gradient measures 1.7 mmHg. Aortic valve peak gradient measures 3.2 mmHg. Aortic valve area, by VTI measures 4.62 cm. Pulmonic Valve: The pulmonic valve was not well visualized. Pulmonic valve regurgitation is not visualized. No evidence of pulmonic stenosis. Aorta: The aortic root and ascending aorta are structurally normal, with no evidence of dilitation. IAS/Shunts: The interatrial septum was not well visualized.  LEFT VENTRICLE PLAX 2D LVIDd:         5.30 cm   Diastology LVIDs:         3.50 cm   LV e' medial:    5.11 cm/s LV PW:         1.10 cm   LV E/e' medial:  14.4 LV IVS:        1.10 cm   LV e'  lateral:   8.16 cm/s LVOT diam:     2.30 cm   LV E/e' lateral: 9.0 LV SV:         60 LV SV Index:   26 LVOT Area:     4.15 cm  LEFT ATRIUM         Index LA diam:    5.10 cm 2.20 cm/m  AORTIC VALVE AV Area (Vmax):    2.91 cm AV Area (Vmean):   3.00 cm AV Area (VTI):     4.62  cm AV Vmax:           89.03 cm/s AV Vmean:          61.254 cm/s AV VTI:            0.129 m AV Peak Grad:      3.2 mmHg AV Mean Grad:      1.7 mmHg LVOT Vmax:         62.30 cm/s LVOT Vmean:        44.300 cm/s LVOT VTI:          0.144 m LVOT/AV VTI ratio: 1.11  AORTA Ao Root diam: 3.60 cm Ao Asc diam:  3.00 cm MITRAL VALVE MV Area (PHT): 2.19 cm    SHUNTS MV Decel Time: 346 msec    Systemic VTI:  0.14 m MV E velocity: 73.40 cm/s  Systemic Diam: 2.30 cm MV A velocity: 89.60 cm/s MV E/A ratio:  0.82 Dorn Ross MD Electronically signed by Dorn Ross MD Signature Date/Time: 09/18/2024/12:16:16 PM    Final     apixaban   2.5 mg Oral BID   ezetimibe   10 mg Oral Daily   insulin  aspart  0-5 Units Subcutaneous QHS   insulin  aspart  0-9 Units Subcutaneous TID WC   losartan   25 mg Oral Daily   metoprolol  tartrate  25 mg Oral BID   potassium chloride   20 mEq Oral BID   tamsulosin   0.4 mg Oral QPM    BMET    Component Value Date/Time   NA 136 09/20/2024 0433   K 4.5 09/20/2024 0433   CL 98 09/20/2024 0433   CO2 25 09/20/2024 0433   GLUCOSE 180 (H) 09/20/2024 0433   BUN 40 (H) 09/20/2024 0433   CREATININE 2.02 (H) 09/20/2024 0433   CREATININE 1.64 (H) 07/15/2020 1159   CALCIUM  9.0 09/20/2024 0433   CALCIUM  8.6 05/04/2023 0952   GFRNONAA 33 (L) 09/20/2024 0433   GFRNONAA 40 (L) 07/15/2020 1159   GFRAA 46 (L) 07/15/2020 1159   CBC    Component Value Date/Time   WBC 5.1 09/19/2024 0428   RBC 3.82 (L) 09/19/2024 0428   HGB 11.4 (L) 09/19/2024 0428   HCT 36.0 (L) 09/19/2024 0428   PLT 176 09/19/2024 0428   MCV 94.2 09/19/2024 0428   MCH 29.8 09/19/2024 0428   MCHC 31.7 09/19/2024 0428   RDW 13.2 09/19/2024 0428    LYMPHSABS 1.3 12/20/2023 0255   MONOABS 0.8 12/20/2023 0255   EOSABS 0.1 12/20/2023 0255   BASOSABS 0.0 12/20/2023 0255    Assessment/Plan:   Acute on chronic HFpEF - managed by Dr. Debera.  Mr. Enderle denies any change in diet and watches sodium.  10 lb weight gain and worsening edema despite increased torsemide  dose over the past 2 weeks.  Thankfully responding to IV lasix  80 mg bid.  ECHO with preserved EF and grade I DD but unable to assess RV function.  Continue with low sodium diet and monitor I's/O's and daily weights.  Continue with torsemide  60 mg daily.  Told to increase to BID if he develops worsening edema and/or weight gain.  Follow up with Drs. Rachele and [place] after discharge.  Will need dietary education on low sodium diet.  We did discuss sodium restriction of 2 grams per day and fluid restriction of 40 oz/day. 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   He was recently restarted on losartan  25 mg daily on 08/29/24 by  Dr. Rachele due to proteinuria.  Will continue with losartan  for now, but may need to stop if Scr continues to climb.  Responding well, no changes for now.   Follow up with Dr. Rachele as above Avoid nephrotoxic medications including NSAIDs and iodinated intravenous contrast exposure unless the latter is absolutely indicated.   Preferred narcotic agents for pain control are hydromorphone , fentanyl, and methadone. Morphine  should not be used.  Avoid Baclofen and avoid oral sodium phosphate and magnesium  citrate based laxatives / bowel preps.  Continue strict Input and Output monitoring.  Will monitor the patient closely with you and intervene or adjust therapy as indicated by changes in clinical status/labs  High dose IV lasix  monitored given high risk for worsening renal function and electrolyte abnormalities. Case discussed with Dr. Maree and will continue to follow.  Anemia of CKD stage IIIb - Hgb stable,  continue to follow. HTN - Bp stable on current regimen.  No changes for now. DM type 2 - per primary svc Paroxysmal atrial fibrillation - on Eliquis  and metoprolol   Deconditioning - pt reports that he mainly uses a wheelchair at home.  Will need HHC PT/OT  Disposition - for discharge today and will follow up with his PCP and Drs. Bhutani and Dillon as above.   [name] Sellar, MD [facility]

## 2024-09-20 NOTE — Discharge Instructions (Addendum)
 [facility]

## 2024-09-20 NOTE — Progress Notes (Signed)
 Unable to obtain REDs clip value due to BMI of 40.33

## 2024-09-20 NOTE — Progress Notes (Signed)
 Nutrition Education Note  RD consulted for low sodium renal Education. Spoke with patient and his wife at bedside. Provided Sodium and Kidney Disease handout to patient's wife. We discussed ways to decrease sodium intake at home and limit of 2,000 mg of sodium per day. Also provided education on low sodium intake while eating out. Answered their questions about specific foods. Reviewed 40 ounce fluid limit as well.   Teach back method used.  Expect good compliance.  Body mass index is 40.33 kg/m. Pt meets criteria for morbid obesity based on current BMI.  Current diet order is heart healthy carb modified, patient is consuming approximately 100% of meals at this time. Labs and medications reviewed. No further nutrition interventions warranted at this time. RD contact information provided. If additional nutrition issues arise, please re-consult RD.  Suzen HUNT RD, LDN, CNSC Contact via secure chat. If unavailable, use group chat RD Inpatient.

## 2024-09-20 NOTE — Discharge Summary (Signed)
 Physician Discharge Summary  USTIN CRUICKSHANK FMW:[id number] DOB: 12/02/1940 DOA: 09/17/2024  PCP: Darren Monetta RAMAN, MD  Admit date: 09/17/2024  Discharge date: 09/20/2024  Admitted From:Home  Disposition:  Home  Recommendations for Outpatient Follow-up:  Follow up with PCP in 1-2 weeks Follow-up with Dr. Debera as scheduled on 11/15/2024 Follow-up with Dr. Rachele as scheduled in December Remain on torsemide  60 mg daily as prescribed and may take an additional dose of 60 mg as needed for weight gain/shortness of breath/edema Continue other home medications as prior Extensively counseled on dietary changes that are needed with 2000 mg sodium diet as well as 40 ounces of fluid per day  Home Health: PT  Equipment/Devices: None  Discharge Condition:Stable  CODE STATUS: Full  Diet recommendation: Heart Healthy/carb modified  Brief/Interim Summary: [name] is a 83 y.o. male with medical history significant of CKD stage 3b with baseline creatinine about 1.7-1.8, type 2 diabetes on insulin , hypertension, hyperlipidemia, current diastolic dysfunction and fluid overload on torsemide  at home, ambulatory dysfunction, paroxysmal A-fib on Eliquis  and metoprolol  presents to the emergency room with about 1 week of worsening leg swelling, gaining weight at home more than 5 pounds.  He was having his home diuretics adjusted by nephrology, but still was not having improvement in his symptoms.  He has been admitted for volume overload in the setting of acute diastolic heart failure.  He was started on IV Lasix  twice daily at 80 mg for diuresis and had responded well.  His discharge weight is 265 pounds and this is at his baseline.  He has been recommended to remain now on torsemide  60 mg daily and may use an additional dose as needed as noted above.  No other acute events or concerns noted throughout the course of this admission.  He has been counseled extensively on adhering to his diet with  sodium as well as fluid restriction.  Discharge Diagnoses:  Principal Problem:   Fluid overload Active Problems:   Essential hypertension   DM (diabetes mellitus) type II controlled with renal manifestation (HCC)   PVD (peripheral vascular disease) (HCC)   CAD (coronary artery disease)   Mixed hyperlipidemia   OSA (obstructive sleep apnea)   CKD (chronic kidney disease), stage IV (HCC)   Peripheral edema  Principal discharge diagnosis: Acute HFpEF secondary to volume overload in the setting of AKI on CKD stage IIIb.  Discharge Instructions  Discharge Instructions     Diet - low sodium heart healthy   Complete by: As directed    Increase activity slowly   Complete by: As directed       Allergies as of 09/20/2024       Reactions   Benazepril Other (See Comments)   Unknown    Lisinopril Other (See Comments)   Unknown    Ivp Dye [iodinated Contrast Media] Rash        Medication List     TAKE these medications    acetaminophen  650 MG CR tablet Commonly known as: TYLENOL  Take 1,300 mg by mouth every 8 (eight) hours as needed for pain.   albuterol  108 (90 Base) MCG/ACT inhaler Commonly known as: VENTOLIN  HFA Inhale 1-2 puffs into the lungs every 6 (six) hours as needed for shortness of breath.   diclofenac Sodium 1 % Gel Commonly known as: VOLTAREN Apply 2 g topically every 6 (six) hours as needed (pain).   Eliquis  2.5 MG Tabs tablet Generic drug: apixaban  Take 2.5 mg by mouth 2 (two) times daily.  ezetimibe  10 MG tablet Commonly known as: ZETIA  Take 1 tablet (10 mg total) by mouth daily.   glucose-Vitamin C  4-0.006 GM Chew chewable tablet Chew 4 tablets by mouth as directed. CHEW FOUR TABLETS BY MOUTH AS DIRECTED BY YOUR PROVIDER (REPEAT EVERY 15 MINUTES IF BLOOD SUGAR LESS THAN 70)   insulin  aspart protamine- aspart (70-30) 100 UNIT/ML injection Commonly known as: NOVOLOG  MIX 70/30 Inject 0.4 mLs (40 Units total) into the skin 2 (two) times daily with  a meal. What changed:  how much to take when to take this additional instructions   lidocaine  5 % Commonly known as: LIDODERM  Place 1 patch onto the skin daily.   losartan  25 MG tablet Commonly known as: COZAAR  Take 25 mg by mouth daily.   Maalox Max 400-400-40 MG/5ML suspension Generic drug: alum & mag hydroxide-simeth Take 15 mLs by mouth as needed for indigestion.   metoprolol  tartrate 50 MG tablet Commonly known as: LOPRESSOR  Take 0.5 tablets (25 mg total) by mouth 2 (two) times daily.   OZEMPIC (0.25 OR 0.5 MG/DOSE) [place] Inject 0.25 mg into the skin once a week.   tamsulosin  0.4 MG Caps capsule Commonly known as: FLOMAX  Take 0.4 mg by mouth every evening.   torsemide  20 MG tablet Commonly known as: DEMADEX  Take 3 tablets (60 mg total) by mouth daily. Take 60 mg daily and take an extra 60mg  if worsening swelling or weight gain of over 3lbs noted in 24 hours. What changed:  how much to take how to take this when to take this additional instructions   Vitamin D  (Ergocalciferol ) 1.25 MG (50000 UNIT) Caps capsule Commonly known as: DRISDOL Take 50,000 Units by mouth once a week.        Contact information for follow-up providers     Darren Monetta RAMAN, MD. Schedule an appointment as soon as possible for a visit in 1 week(s).   Specialty: Internal Medicine Contact information: [address] [place] [address] [phone]         Rachele Gaynell RAMAN, MD. Go to.   Specialty: Nephrology Contact information: [address] [place] [address] [phone]              Contact information for after-discharge care     Home Medical Care     [facility]) .   Service: Home Health Services Contact information: [phone] [place]  [phone]                    Allergies  Allergen Reactions   Benazepril Other (See Comments)    Unknown    Lisinopril Other  (See Comments)    Unknown    Ivp Dye [Iodinated Contrast Media] Rash    Consultations: Nephrology   Procedures/Studies: ECHOCARDIOGRAM COMPLETE Result Date: 09/18/2024    ECHOCARDIOGRAM REPORT   Patient Name:   [name] Date of Exam: 09/18/2024 Medical Rec #:  [id number]     Height:       68.0 in Accession #:    [id number]    Weight:       269.1 lb Date of Birth:  06/09/1941      BSA:          2.318 m Patient Age:    83 years      BP:           127/55 mmHg Patient Gender: M  HR:           56 bpm. Exam Location:  [facility] Procedure: 2D Echo, Cardiac Doppler, Color Doppler and Intracardiac            Opacification Agent (Both Spectral and Color Flow Doppler were            utilized during procedure). Indications:    CHF I50.31  History:        Patient has prior history of Echocardiogram examinations, most                 recent 01/08/2022. Stroke, Arrythmias:Atrial Fibrillation; Risk                 Factors:Hypertension and Dyslipidemia.  Sonographer:    Tinnie Gosling RDCS Referring Phys: [id number] Davionne Dowty D [name] IMPRESSIONS  1. Left ventricular ejection fraction, by estimation, is 55 to 60%. The left ventricle has normal function. Left ventricular endocardial border not optimally defined to evaluate regional wall motion. There is mild left ventricular hypertrophy. Left ventricular diastolic parameters are consistent with Grade I diastolic dysfunction (impaired relaxation).  2. Right ventricular systolic function was not well visualized. The right ventricular size is not well visualized. Tricuspid regurgitation signal is inadequate for assessing PA pressure.  3. The mitral valve was not well visualized. No evidence of mitral valve regurgitation. No evidence of mitral stenosis.  4. The aortic valve was not well visualized. There is mild calcification of the aortic valve. There is mild thickening of the aortic valve. Aortic valve regurgitation is not visualized. No aortic stenosis is present.  5.  Technically difficult study, limited visualization. FINDINGS  Left Ventricle: Left ventricular ejection fraction, by estimation, is 55 to 60%. The left ventricle has normal function. Left ventricular endocardial border not optimally defined to evaluate regional wall motion. The left ventricular internal cavity size was normal in size. There is mild left ventricular hypertrophy. Left ventricular diastolic parameters are consistent with Grade I diastolic dysfunction (impaired relaxation). Normal left ventricular filling pressure. Right Ventricle: The right ventricular size is not well visualized. Right vetricular wall thickness was not well visualized. Right ventricular systolic function was not well visualized. Tricuspid regurgitation signal is inadequate for assessing PA pressure. Left Atrium: Left atrial size was not well visualized. Right Atrium: Right atrial size was not well visualized. Pericardium: There is no evidence of pericardial effusion. Mitral Valve: The mitral valve was not well visualized. No evidence of mitral valve regurgitation. No evidence of mitral valve stenosis. Tricuspid Valve: The tricuspid valve is not well visualized. Tricuspid valve regurgitation is not demonstrated. No evidence of tricuspid stenosis. Aortic Valve: The aortic valve was not well visualized. There is mild calcification of the aortic valve. There is mild thickening of the aortic valve. There is mild aortic valve annular calcification. Aortic valve regurgitation is not visualized. No aortic stenosis is present. Aortic valve mean gradient measures 1.7 mmHg. Aortic valve peak gradient measures 3.2 mmHg. Aortic valve area, by VTI measures 4.62 cm. Pulmonic Valve: The pulmonic valve was not well visualized. Pulmonic valve regurgitation is not visualized. No evidence of pulmonic stenosis. Aorta: The aortic root and ascending aorta are structurally normal, with no evidence of dilitation. IAS/Shunts: The interatrial septum was not  well visualized.  LEFT VENTRICLE PLAX 2D LVIDd:         5.30 cm   Diastology LVIDs:         3.50 cm   LV e' medial:    5.11 cm/s LV PW:  1.10 cm   LV E/e' medial:  14.4 LV IVS:        1.10 cm   LV e' lateral:   8.16 cm/s LVOT diam:     2.30 cm   LV E/e' lateral: 9.0 LV SV:         60 LV SV Index:   26 LVOT Area:     4.15 cm  LEFT ATRIUM         Index LA diam:    5.10 cm 2.20 cm/m  AORTIC VALVE AV Area (Vmax):    2.91 cm AV Area (Vmean):   3.00 cm AV Area (VTI):     4.62 cm AV Vmax:           89.03 cm/s AV Vmean:          61.254 cm/s AV VTI:            0.129 m AV Peak Grad:      3.2 mmHg AV Mean Grad:      1.7 mmHg LVOT Vmax:         62.30 cm/s LVOT Vmean:        44.300 cm/s LVOT VTI:          0.144 m LVOT/AV VTI ratio: 1.11  AORTA Ao Root diam: 3.60 cm Ao Asc diam:  3.00 cm MITRAL VALVE MV Area (PHT): 2.19 cm    SHUNTS MV Decel Time: 346 msec    Systemic VTI:  0.14 m MV E velocity: 73.40 cm/s  Systemic Diam: 2.30 cm MV A velocity: 89.60 cm/s MV E/A ratio:  0.82 Dorn Ross MD Electronically signed by Dorn Ross MD Signature Date/Time: 09/18/2024/12:16:16 PM    Final    DG Chest 2 View Result Date: 09/17/2024 EXAM: 2 VIEW(S) XRAY OF THE CHEST 09/17/2024 10:07:00 AM COMPARISON: 09/15/2024 CLINICAL HISTORY: Occasional Shortness of breath FINDINGS: LUNGS AND PLEURA: No focal pulmonary opacity. No pleural effusion. No pneumothorax. HEART AND MEDIASTINUM: No acute abnormality of the cardiac and mediastinal silhouettes. BONES AND SOFT TISSUES: Sternotomy wires. Surgical clips at the thoracic inlet on the right. No acute osseous abnormality. IMPRESSION: 1. No acute cardiopulmonary process. Electronically signed by: Katheleen Faes MD 09/17/2024 10:42 AM EST RP Workstation: [id number]   DG Chest Port 1 View Result Date: 09/15/2024 CLINICAL DATA:  Shortness of breath with weight gain. EXAM: PORTABLE CHEST 1 VIEW COMPARISON:  12/20/2023 FINDINGS: Sternotomy wires unchanged. Lungs are hypoinflated  without focal airspace consolidation or effusion. Cardiomediastinal silhouette and remainder of the exam is unchanged. IMPRESSION: Hypoinflation without acute cardiopulmonary disease. Electronically Signed   By: Toribio Agreste M.D.   On: 09/15/2024 09:36     Discharge Exam: Vitals:   09/20/24 0439 09/20/24 0824  BP: 123/67 132/62  Pulse: (!) 56 62  Resp: 20   Temp: 97.6 F (36.4 C)   SpO2: 98%    Vitals:   09/19/24 2003 09/20/24 0325 09/20/24 0439 09/20/24 0824  BP: (!) 153/67  123/67 132/62  Pulse: (!) 57  (!) 56 62  Resp: 20  20   Temp: 98.2 F (36.8 C)  97.6 F (36.4 C)   TempSrc: Oral  Oral   SpO2: 99%  98%   Weight:  120.3 kg    Height:        General: Pt is alert, awake, not in acute distress Cardiovascular: RRR, S1/S2 +, no rubs, no gallops Respiratory: CTA bilaterally, no wheezing, no rhonchi Abdominal: Soft, NT, ND, bowel sounds + Extremities: no edema, no cyanosis  The results of significant diagnostics from this hospitalization (including imaging, microbiology, ancillary and laboratory) are listed below for reference.     Microbiology: No results found for this or any previous visit (from the past 240 hours).   Labs: BNP (last 3 results) Recent Labs    12/20/23 0255  BNP 45.0   Basic Metabolic Panel: Recent Labs  Lab 09/15/24 0931 09/17/24 0958 09/17/24 1043 09/18/24 0423 09/19/24 0428 09/20/24 0433  NA 139 140  --  138 137 136  K 3.9 3.7  --  4.6 4.4 4.5  CL 100 103  --  102 100 98  CO2 27 27  --  28 26 25   GLUCOSE 135* 66*  --  113* 172* 180*  BUN 27* 27*  --  31* 35* 40*  CREATININE 1.98* 1.76*  --  1.92* 1.99* 2.02*  CALCIUM  8.6* 8.7*  --  8.8* 9.0 9.0  MG  --   --  2.3  --   --  2.3  PHOS  --   --   --   --  3.3 3.9   Liver Function Tests: Recent Labs  Lab 09/19/24 0428 09/20/24 0433  ALBUMIN 3.7 3.7   No results for input(s): LIPASE, AMYLASE in the last 168 hours. No results for input(s): AMMONIA in the last 168  hours. CBC: Recent Labs  Lab 09/15/24 0931 09/17/24 0958 09/17/24 1053 09/19/24 0428  WBC 4.6 8.2 4.6 5.1  HGB 11.2* 4.7* 11.6* 11.4*  HCT 35.8* 15.1* 37.1* 36.0*  MCV 97.0 100.0 95.9 94.2  PLT 178 161 197 176   Cardiac Enzymes: No results for input(s): CKTOTAL, CKMB, CKMBINDEX, TROPONINI in the last 168 hours. BNP: Invalid input(s): POCBNP CBG: Recent Labs  Lab 09/19/24 0742 09/19/24 1104 09/19/24 1622 09/19/24 2008 09/20/24 0729  GLUCAP 177* 278* 180* 228* 203*   D-Dimer No results for input(s): DDIMER in the last 72 hours. Hgb A1c No results for input(s): HGBA1C in the last 72 hours. Lipid Profile No results for input(s): CHOL, HDL, LDLCALC, TRIG, CHOLHDL, LDLDIRECT in the last 72 hours. Thyroid  function studies No results for input(s): TSH, T4TOTAL, T3FREE, THYROIDAB in the last 72 hours.  Invalid input(s): FREET3 Anemia work up No results for input(s): VITAMINB12, FOLATE, FERRITIN, TIBC, IRON, RETICCTPCT in the last 72 hours. Urinalysis    Component Value Date/Time   COLORURINE STRAW (A) 09/18/2024 1700   APPEARANCEUR CLEAR 09/18/2024 1700   LABSPEC 1.011 09/18/2024 1700   PHURINE 5.0 09/18/2024 1700   GLUCOSEU NEGATIVE 09/18/2024 1700   HGBUR SMALL (A) 09/18/2024 1700   BILIRUBINUR NEGATIVE 09/18/2024 1700   KETONESUR NEGATIVE 09/18/2024 1700   PROTEINUR NEGATIVE 09/18/2024 1700   UROBILINOGEN 0.2 03/28/2014 0145   NITRITE NEGATIVE 09/18/2024 1700   LEUKOCYTESUR NEGATIVE 09/18/2024 1700   Sepsis Labs Recent Labs  Lab 09/15/24 0931 09/17/24 0958 09/17/24 1053 09/19/24 0428  WBC 4.6 8.2 4.6 5.1   Microbiology No results found for this or any previous visit (from the past 240 hours).   Time coordinating discharge: 35 minutes  SIGNED:   Adron JONETTA Fairly, DO [facility] 09/20/2024, 9:26 AM  If 7PM-7AM, please contact night-coverage [email or web address]

## 2024-09-21 ENCOUNTER — Other Ambulatory Visit: Payer: Self-pay

## 2024-09-21 MED ORDER — KERENDIA 10 MG PO TABS: 10.0000 mg | ORAL_TABLET | Freq: Every day | ORAL | 3 refills | Status: DC

## 2024-09-21 NOTE — Telephone Encounter (Signed)
 I spoke with [name] and she states Dr.Bhutani messaged them back stating he was fine with medication plan.    I will forward to Dr.McDowell

## 2024-09-21 NOTE — Telephone Encounter (Signed)
 I spoke with patient and wife.[name] will stop the losartan  and start Kerendia  10 mg daily.The request rx be sent to [facility]

## 2024-09-21 NOTE — Telephone Encounter (Signed)
 Fine with switching to kerendia 

## 2024-09-21 NOTE — Addendum Note (Signed)
 Addended by: Abner Ardis A on: 09/21/2024 03:45 PM   Modules accepted: Orders

## 2024-09-25 ENCOUNTER — Encounter: Admitting: Nutrition

## 2024-10-01 ENCOUNTER — Telehealth: Payer: Self-pay | Admitting: Cardiology

## 2024-10-01 NOTE — Telephone Encounter (Signed)
 Patient informed and verbalized understanding of plan.

## 2024-10-01 NOTE — Telephone Encounter (Signed)
 Pt c/o medication issue:  1. Name of Medication:   apixaban  (ELIQUIS ) 2.5 MG TABS tablet    2. How are you currently taking this medication (dosage and times per day)? Take 2.5 mg by mouth 2 (two) times daily.   3. Are you having a reaction (difficulty breathing--STAT)? No   4. What is your medication issue? Pt had an ED visit recently and dosage for eliquis  was adjusted. Pts wife is requesting a c/b to make sure it is taking the correct dosage. Please advise.

## 2024-10-01 NOTE — Telephone Encounter (Signed)
 Wife notified that her msg has been sent to the pharmacy staff for review.

## 2024-10-04 ENCOUNTER — Emergency Department (HOSPITAL_COMMUNITY)

## 2024-10-04 ENCOUNTER — Other Ambulatory Visit: Payer: Self-pay

## 2024-10-04 ENCOUNTER — Encounter (HOSPITAL_COMMUNITY): Payer: Self-pay

## 2024-10-04 ENCOUNTER — Emergency Department (HOSPITAL_COMMUNITY)
Admission: EM | Admit: 2024-10-04 | Discharge: 2024-10-04 | Disposition: A | Discharge status: Home / Self Care | Attending: Emergency Medicine | Admitting: Emergency Medicine

## 2024-10-04 DIAGNOSIS — E1122 Type 2 diabetes mellitus with diabetic chronic kidney disease: Secondary | ICD-10-CM | POA: Insufficient documentation

## 2024-10-04 DIAGNOSIS — Z79899 Other long term (current) drug therapy: Secondary | ICD-10-CM | POA: Insufficient documentation

## 2024-10-04 DIAGNOSIS — Z8673 Personal history of transient ischemic attack (TIA), and cerebral infarction without residual deficits: Secondary | ICD-10-CM | POA: Insufficient documentation

## 2024-10-04 DIAGNOSIS — Z7901 Long term (current) use of anticoagulants: Secondary | ICD-10-CM | POA: Insufficient documentation

## 2024-10-04 DIAGNOSIS — R600 Localized edema: Secondary | ICD-10-CM | POA: Insufficient documentation

## 2024-10-04 DIAGNOSIS — I509 Heart failure, unspecified: Secondary | ICD-10-CM | POA: Insufficient documentation

## 2024-10-04 DIAGNOSIS — M7989 Other specified soft tissue disorders: Secondary | ICD-10-CM

## 2024-10-04 DIAGNOSIS — Z794 Long term (current) use of insulin: Secondary | ICD-10-CM | POA: Insufficient documentation

## 2024-10-04 DIAGNOSIS — N189 Chronic kidney disease, unspecified: Secondary | ICD-10-CM | POA: Insufficient documentation

## 2024-10-04 DIAGNOSIS — I2510 Atherosclerotic heart disease of native coronary artery without angina pectoris: Secondary | ICD-10-CM | POA: Insufficient documentation

## 2024-10-04 DIAGNOSIS — I130 Hypertensive heart and chronic kidney disease with heart failure and stage 1 through stage 4 chronic kidney disease, or unspecified chronic kidney disease: Secondary | ICD-10-CM | POA: Insufficient documentation

## 2024-10-04 DIAGNOSIS — R7989 Other specified abnormal findings of blood chemistry: Secondary | ICD-10-CM | POA: Insufficient documentation

## 2024-10-04 LAB — COMPREHENSIVE METABOLIC PANEL WITH GFR
ALT: 28 U/L (ref 0–44)
AST: 29 U/L (ref 15–41)
Albumin: 3.9 g/dL (ref 3.5–5.0)
Alkaline Phosphatase: 70 U/L (ref 38–126)
Anion gap: 10 (ref 5–15)
BUN: 45 mg/dL — ABNORMAL HIGH (ref 8–23)
CO2: 29 mmol/L (ref 22–32)
Calcium: 9.3 mg/dL (ref 8.9–10.3)
Chloride: 101 mmol/L (ref 98–111)
Creatinine, Ser: 2.22 mg/dL — ABNORMAL HIGH (ref 0.61–1.24)
GFR, Estimated: 29 mL/min — ABNORMAL LOW (ref >60)
Glucose, Bld: 137 mg/dL — ABNORMAL HIGH (ref 70–99)
Potassium: 5.1 mmol/L (ref 3.5–5.1)
Sodium: 140 mmol/L (ref 135–145)
Total Bilirubin: 0.5 mg/dL (ref 0.0–1.2)
Total Protein: 7.7 g/dL (ref 6.5–8.1)

## 2024-10-04 LAB — TROPONIN T, HIGH SENSITIVITY
Troponin T High Sensitivity: 26 ng/L — ABNORMAL HIGH (ref 0–19)
Troponin T High Sensitivity: 25 ng/L — ABNORMAL HIGH (ref 0–19)

## 2024-10-04 LAB — CBC WITH DIFFERENTIAL/PLATELET
Abs Immature Granulocytes: 0.01 K/uL (ref 0.00–0.07)
Basophils Absolute: 0 K/uL (ref 0.0–0.1)
Basophils Relative: 0 %
Eosinophils Absolute: 0.1 K/uL (ref 0.0–0.5)
Eosinophils Relative: 1 %
HCT: 37.8 % — ABNORMAL LOW (ref 39.0–52.0)
Hemoglobin: 11.9 g/dL — ABNORMAL LOW (ref 13.0–17.0)
Immature Granulocytes: 0 %
Lymphocytes Relative: 23 %
Lymphs Abs: 1.2 K/uL (ref 0.7–4.0)
MCH: 30.1 pg (ref 26.0–34.0)
MCHC: 31.5 g/dL (ref 30.0–36.0)
MCV: 95.7 fL (ref 80.0–100.0)
Monocytes Absolute: 0.6 K/uL (ref 0.1–1.0)
Monocytes Relative: 12 %
Neutro Abs: 3.1 K/uL (ref 1.7–7.7)
Neutrophils Relative %: 64 %
Platelets: 209 K/uL (ref 150–400)
RBC: 3.95 MIL/uL — ABNORMAL LOW (ref 4.22–5.81)
RDW: 12.9 % (ref 11.5–15.5)
WBC: 5 K/uL (ref 4.0–10.5)
nRBC: 0 % (ref 0.0–0.2)

## 2024-10-04 LAB — PRO BRAIN NATRIURETIC PEPTIDE: Pro Brain Natriuretic Peptide: 133 pg/mL (ref <300.0)

## 2024-10-04 NOTE — ED Provider Notes (Signed)
 [place] EMERGENCY DEPARTMENT AT [facility] Provider Note   CSN: [id number] Arrival date & time: 10/04/24  1405     Patient presents with: Leg Swelling   [name] is a 83 y.o. male.   HPI     [name] is a 83 y.o. male with past medical history of type 2 diabetes, hypertension, coronary artery disease, peripheral vascular disease, prior stroke CKD CHF anemia of chronic disease and atrial fibrillation.  Patient is anticoagulated on Eliquis .  He presents to the Emergency Department complaining of swelling to his left lower leg and left ankle.  He also notes having some shortness of breath.  He called his kidney doctor's office and was advised to come to the ER for further evaluation of possible blood clot.  No history of PE or DVT.  He is currently on Demadex  60 mg daily.  He denies any missed doses of his medications.  No fever or chills.  He denies any chest pain  Patient's spouse who is also in the room and provides additional history.  States that he has been advised to stay on a strict oral intake of 40 oz of fluids per day and states that he has been drinking more water recently than he is supposed to and she feels as though his weight has increased   Prior to Admission medications   Medication Sig Start Date End Date Taking? Authorizing Provider  acetaminophen  (TYLENOL ) 650 MG CR tablet Take 1,300 mg by mouth every 8 (eight) hours as needed for pain.    [provider]  albuterol  (VENTOLIN  HFA) 108 (90 Base) MCG/ACT inhaler Inhale 1-2 puffs into the lungs every 6 (six) hours as needed for shortness of breath. 03/23/22   [provider]  alum & mag hydroxide-simeth (MAALOX MAX) 400-400-40 MG/5ML suspension Take 15 mLs by mouth as needed for indigestion. 12/20/23   Kommor, Madison, MD  apixaban  (ELIQUIS ) 2.5 MG TABS tablet Take 2.5 mg by mouth 2 (two) times daily.    [provider]  diclofenac Sodium (VOLTAREN) 1 % GEL Apply 2 g  topically every 6 (six) hours as needed (pain). 04/10/24   [provider]  ezetimibe  (ZETIA ) 10 MG tablet Take 1 tablet (10 mg total) by mouth daily. 07/25/23   Debera Jayson MATSU, MD  Finerenone  (KERENDIA ) 10 MG TABS Take 1 tablet (10 mg total) by mouth daily. 09/21/24   Debera Jayson MATSU, MD  glucose-Vitamin C  4-0.006 GM CHEW chewable tablet Chew 4 tablets by mouth as directed. CHEW FOUR TABLETS BY MOUTH AS DIRECTED BY YOUR PROVIDER (REPEAT EVERY 15 MINUTES IF BLOOD SUGAR LESS THAN 70) 03/09/22   [provider]  insulin  aspart protamine- aspart (NOVOLOG  MIX 70/30) (70-30) 100 UNIT/ML injection Inject 0.4 mLs (40 Units total) into the skin 2 (two) times daily with a meal. Patient taking differently: Inject 40-50 Units into the skin See admin instructions. Inject 40 units in the morning and 50 in the evening 07/29/22   Johnson, Clanford L, MD  lidocaine  (LIDODERM ) 5 % Place 1 patch onto the skin daily. 07/09/24   [provider]  metoprolol  tartrate (LOPRESSOR ) 50 MG tablet Take 0.5 tablets (25 mg total) by mouth 2 (two) times daily. 09/20/24   Maree, Pratik D, DO  Semaglutide (OZEMPIC, 0.25 OR 0.5 MG/DOSE, [place]) Inject 0.25 mg into the skin once a week. 02/26/22   [provider]  tamsulosin  (FLOMAX ) 0.4 MG CAPS capsule Take 0.4 mg by mouth every evening.  [provider]  torsemide  (DEMADEX ) 20 MG tablet Take 3 tablets (60 mg total) by mouth daily. Take 60 mg daily and take an extra 60mg  if worsening swelling or weight gain of over 3lbs noted in 24 hours. 09/20/24   Maree, Pratik D, DO  Vitamin D , Ergocalciferol , (DRISDOL) 1.25 MG (50000 UNIT) CAPS capsule Take 50,000 Units by mouth once a week. 06/02/22   [provider]    Allergies: Benazepril, Lisinopril, and Ivp dye [iodinated contrast media]    Review of Systems  Respiratory:  Positive for shortness of breath.   Cardiovascular:  Positive for leg swelling. Negative for chest pain.  Skin:   Negative for color change and wound.  Neurological:  Negative for weakness and numbness.  All other systems reviewed and are negative.   Updated Vital Signs BP (!) 123/58   Pulse (!) 59   Temp 98.7 F (37.1 C) (Oral)   Resp (!) 23   Ht 5' 8 (1.727 m)   Wt 120.3 kg   SpO2 99%   BMI 40.33 kg/m   Physical Exam Vitals and nursing note reviewed.  Constitutional:      General: He is not in acute distress.    Appearance: Normal appearance. He is not ill-appearing or toxic-appearing.  Cardiovascular:     Rate and Rhythm: Normal rate and regular rhythm.     Pulses: Normal pulses.  Pulmonary:     Effort: Pulmonary effort is normal.     Breath sounds: Normal breath sounds.  Abdominal:     Palpations: Abdomen is soft.     Tenderness: There is no abdominal tenderness.  Musculoskeletal:     Right lower leg: No edema.     Left lower leg: Edema present.     Comments: 1+ pitting edema bilateral lower extremities.  There is some lateral swelling to the left lower leg and ankle.  I do not appreciate any erythema or excessive warmth.  No open wounds of the extremity.  No posterior calf pain or palpable cord.  Skin:    General: Skin is warm.     Capillary Refill: Capillary refill takes less than 2 seconds.     Findings: No erythema or rash.  Neurological:     General: No focal deficit present.     Mental Status: He is alert.     Sensory: No sensory deficit.     Motor: No weakness.     (all labs ordered are listed, but only abnormal results are displayed) Labs Reviewed  CBC WITH DIFFERENTIAL/PLATELET - Abnormal; Notable for the following components:      Result Value   RBC 3.95 (*)    Hemoglobin 11.9 (*)    HCT 37.8 (*)    All other components within normal limits  COMPREHENSIVE METABOLIC PANEL WITH GFR - Abnormal; Notable for the following components:   Glucose, Bld 137 (*)    BUN 45 (*)    Creatinine, Ser 2.22 (*)    GFR, Estimated 29 (*)    All other components within normal  limits  TROPONIN T, HIGH SENSITIVITY - Abnormal; Notable for the following components:   Troponin T High Sensitivity 26 (*)    All other components within normal limits  PRO BRAIN NATRIURETIC PEPTIDE  TROPONIN T, HIGH SENSITIVITY    EKG: None  Radiology: US  Venous Img Lower Unilateral Left Result Date: 10/04/2024 CLINICAL DATA:  Swelling and pain in the left lower leg and ankle area. EXAM: LEFT LOWER EXTREMITY VENOUS DOPPLER ULTRASOUND  TECHNIQUE: Gray-scale sonography with graded compression, as well as color Doppler and duplex ultrasound were performed to evaluate the lower extremity deep venous systems from the level of the common femoral vein and including the common femoral, femoral, profunda femoral, popliteal and calf veins including the posterior tibial, peroneal and gastrocnemius veins when visible. Spectral Doppler was utilized to evaluate flow at rest and with distal augmentation maneuvers in the common femoral, femoral and popliteal veins. COMPARISON:  None Available. FINDINGS: Contralateral Common Femoral Vein: Respiratory phasicity is normal and symmetric with the symptomatic side. No evidence of thrombus. Normal compressibility. Common Femoral Vein: No evidence of thrombus. Normal compressibility, respiratory phasicity and response to augmentation. Saphenofemoral Junction: No evidence of thrombus. Normal compressibility and flow on color Doppler imaging. Profunda Femoral Vein: No evidence of thrombus. Normal compressibility and flow on color Doppler imaging. Femoral Vein: No evidence of thrombus. Normal compressibility, respiratory phasicity and response to augmentation. Popliteal Vein: Large echogenic focus along the popliteal vein wall is suggestive for a calcification and could be related to a chronic thrombus. Partial compressibility of the left popliteal vein. Normal color Doppler flow in the left popliteal vein. Calf Veins: No evidence of thrombus. Normal compressibility and flow on  color Doppler imaging. Other Findings:  None. IMPRESSION: 1. No evidence for acute deep venous thrombosis in left lower extremity. 2. Incomplete compressibility in left popliteal vein with a large echogenic focus that most likely represents calcium . 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 Electronically Signed   By: Juliene Balder M.D.   On: 10/04/2024 16:26   DG Chest 2 View Result Date: 10/04/2024 EXAM: 2 VIEW(S) XRAY OF THE CHEST 10/04/2024 02:47:00 PM COMPARISON: Comparison 09/17/2024. CLINICAL HISTORY: SOB FINDINGS: LUNGS AND PLEURA: No focal pulmonary opacity. No pleural effusion. No pneumothorax. HEART AND MEDIASTINUM: Sternotomy wires are noted. No acute abnormality of the cardiac and mediastinal silhouettes. BONES AND SOFT TISSUES: No acute osseous abnormality. IMPRESSION: 1. No acute cardiopulmonary process. Electronically signed by: Lynwood Seip MD 10/04/2024 03:25 PM EST RP Workstation: [id number]     Procedures   Medications Ordered in the ED - No data to display                                  Medical Decision Making   Patient here from home for evaluation of swelling to left lower extremity.  Symptoms associated with some shortness of breath, no chest pain cough fever or chills.  No history of PE or DVT.  He is anticoagulated denies any missed doses.  He is on Demadex  3 tablets daily.  He does not feel he has any bloating or swelling of his abdomen.  Patient had echocardiogram 09/18/2024 that showed LVEF of 55 to 60%  There is some mild edema of the bilateral lower extremities with left greater than right.  No open wounds to suggest a cellulitis.  He has no posterior calf pain or redness of the extremity to suggest DVT.  He is anticoagulated.  Clinically I have low suspicion that this is DVT.  He endorses some shortness of breath that has not significantly increased from baseline.  His lung sounds are reassuring.  Amount and/or Complexity of Data Reviewed Labs:  ordered.    Details: No evidence of leukocytosis, hemoglobin near baseline.  Serum creatinine elevated but also near baseline BMP unremarkable, initial troponin slightly elevated at 26 delta Trop flat Radiology: ordered.  Details: Chest x-ray without acute cardiopulmonary process.  No pleural effusion  Venous imaging negative for presence of DVT ECG/medicine tests: ordered.    Details: Sinus rhythm prolonged PR interval left anterior fascicular block Discussion of management or test interpretation with external provider(s): Workup with out suggestion of CHF exacerbation.  He is already on 60 mg of Demadex  daily.  Ultrasound without evidence of DVT, discussed findings with ED physician, Dr. Zammit.  Will have patient elevate the extremity.  I have had lengthy discussion about importance of staying within his recommended daily oral intake.  Will follow-up closely outpatient tomorrow with his nephrologist.        Final diagnoses:  Peripheral edema  Leg swelling    ED Discharge Orders     None          Herlinda Milling, PA-C 10/04/24 1813    Suzette Pac, MD 10/05/24 1158

## 2024-10-04 NOTE — Discharge Instructions (Addendum)
 As discussed, it is important that you stay within the recommended limit of oral intake.  Please elevate your left lower extremity.  Your ultrasound today did not show evidence of a blood clot.  Please call your nephrologist office tomorrow to arrange follow-up.  Return to the emergency department if you develop any new or worsening symptoms

## 2024-10-04 NOTE — ED Notes (Signed)
 Pt states wife called his kidney dr today due to ble and was advised to come to ed. Pt states when they swell the left is always has more swelling than the right. 1plus edema noted to ble with left leg more swollen than right. Pt c/o intermittent shob x 1 week that is worse with activity. Nad. Denies pain. A/o. Color wnl. Moving all extremities.

## 2024-10-04 NOTE — ED Triage Notes (Signed)
 Pt arrived via POV after being advised to come to [facility] for evaluation of RLE swelling to R/O DVT. Pt also reports Hx of CHF. Pt does endorse SOB.

## 2024-10-17 NOTE — Progress Notes (Deleted)
 Cardiology Office Note:  .   Date:  10/17/2024  ID:  [name], DOB 14-Jul-1941, MRN [id number] PCP: Darren Monetta RAMAN, MD  [facility] Providers Cardiologist:  Jayson Sierras, MD { Click to update primary MD,subspecialty MD or APP then REFRESH:1}   History of Present Illness: .   [name] is a 83 y.o. male  with PMHx of chronic HFpEF, lymphedema, paroxysmal atrial fibrillation/flutter, multivessel CAD s/p CABG in 2007, CKD stage IIIb, HLD who reports to [facility] office for hospital follow up.   Pertinent cardiac medical history:  Chronic HFpEF Preserved EF via echo since 2014 ECHO 09/2024: EF 55 to 60%, mild LVH, G1 DD, mild calcification/thickening of AV CAD s/p CABG x5 in 2007 at [facility]) NST in 07/2017 showing mild peri-infarct ischemia but overall low-risk  Lexiscan  2021: Low risk study, negative for ischemia  Last seen in heartcare 05/2024 by Dr. Sierras for follow-up.  Noted 1 week ago started on home compression device for treatment of lymphedema.  Overall doing well from cardiac standpoint without any complaints.  Continued on Demadex  40 mg daily, Eliquis  2.5 mg twice daily, Lopressor  25 mg twice daily, Imdur  30 mg daily, Zetia  10 mg daily, Lipitor 40 mg daily.   Seen in ED 09/15/2024 for leg swelling and weight gain. Weight 122 kg.  Treated with IV diuresis and KCl supplement.  Patient refused admission.  Recommended continuing torsemide  60 mg.  Admitted 11/17-20/2025 for fluid overload in the setting of acute diastolic heart failure.  Treated with IV diuresis.  Discharge weight was back at baseline 265 pounds.  Discharged on torsemide  60 mg daily and may use an additional dose as needed.  Seen in the ED 10/04/2024 for left leg/ankle swelling and SOB.  Weight 120 kg. Reported drinking more fluids than the recommended 40 ounces of fluids per day.  Lab work with no significant findings.  No signs of DVT or CHF exacerbation per workup.   Recommended leg elevation and following recommended daily oral intake.  Continued on torsemide  60 mg daily.  Today, reports ### and denies ###.  Denies chest pain, shortness of breath, palpitations, syncope, presyncope, dizziness, orthopnea, PND, swelling or significant weight changes, acute bleeding, or claudication.   Reports compliance with medications.  Dietary habitats:  Activity level:  Social: Denies tobacco use/alcohol /drug use  Denies any recent hospitalizations or visits to the emergency department.   Chronic heart failure with preserved ejection fraction (HCC) ECHO: Denies SOB, edema, orthopnea, or PND. Appears Euvolemic on exam.  Continue on Torsemide  60 mg daily.  He has a history of gynecomastia on Aldactone .  GDMT limited due to low blood pressure of .SABRA or AKI/CKD Encouraged low sodium diet, fluid restriction <2L, and daily weights.  Educated to contact our office for weight gain of 2 lbs overnight or 5 lbs in one week. ED precautions discussed.    Lymphedema He continues to use mechanical compression at home following evaluation in the [place] clinic.   Paroxysmal atrial fibrillation (HCC) Paroxysmal atrial flutter (HCC) Reviewed EKG on or EKG today shows  On exam noted regular, rate and rhythm OR irregularly irregular.  Denies palpitations, dizziness, or chest discomfort. No recurrent dark stools or bleeding. On AC with CBC stable as below.  Continue on Eliquis  2.5 mg twice daily and Lopressor  25 mg twice daily.      Latest Ref Rng & Units 10/04/2024    3:07 PM 09/19/2024    4:28 AM 09/17/2024   10:53  AM  CBC  WBC 4.0 - 10.5 K/uL 5.0  5.1  4.6   Hemoglobin 13.0 - 17.0 g/dL 88.0  88.5  88.3   Hematocrit 39.0 - 52.0 % 37.8  36.0  37.1   Platelets 150 - 400 K/uL 209  176  197     Coronary artery disease involving native coronary artery of native heart with angina pectoris Hyperlipidemia LDL goal <55 Denies angina symptoms. No need for further ischemic evaluation at  this time.  LDL? LFT? Continue Imdur  30 mg daily Not on ASA due ?    Lipid Panel     Component Value Date/Time   CHOL 128 08/16/2020 0513   TRIG 110 08/16/2020 0513   HDL 30 (L) 08/16/2020 0513   CHOLHDL 4.3 08/16/2020 0513   VLDL 22 08/16/2020 0513   LDLCALC 76 08/16/2020 0513   LDLCALC 98 03/07/2020 1158     Stage 3b chronic kidney disease (HCC) Follows with Dr. Rachele.  Cr 2.22 and GFR 29 in 10/2024  ROS: 10 point review of system has been reviewed and considered negative except ones been listed in the HPI.   Studies Reviewed: .   Lexiscan  05/2020 Narrative & Impression  Lexiscan  is electrically negative for ischemi Myovue scan with probable normal perfusion, minimal soft tissue attenuation. No changes to suggest ischemia. LVEF calculated at 49% with septal hypokinesis; consider echo to further define LVEF / wall motion This is a low risk study.    ECHO 09/2024 IMPRESSIONS   1. Left ventricular ejection fraction, by estimation, is 55 to 60%. The  left ventricle has normal function. Left ventricular endocardial border  not optimally defined to evaluate regional wall motion. There is mild left  ventricular hypertrophy. Left  ventricular diastolic parameters are consistent with Grade I diastolic  dysfunction (impaired relaxation).   2. Right ventricular systolic function was not well visualized. The right  ventricular size is not well visualized. Tricuspid regurgitation signal is  inadequate for assessing PA pressure.   3. The mitral valve was not well visualized. No evidence of mitral valve  regurgitation. No evidence of mitral stenosis.   4. The aortic valve was not well visualized. There is mild calcification  of the aortic valve. There is mild thickening of the aortic valve. Aortic  valve regurgitation is not visualized. No aortic stenosis is present.   5. Technically difficult study, limited visualization.  Risk Assessment/Calculations:   {Does this patient have  ATRIAL FIBRILLATION?:[phone]} No BP recorded.  {Refresh Note OR Click here to enter BP  :1}***       Physical Exam:   VS:  There were no vitals taken for this visit.   Wt Readings from Last 3 Encounters:  10/04/24 265 lb 3.4 oz (120.3 kg)  09/20/24 265 lb 3.4 oz (120.3 kg)  09/15/24 271 lb (122.9 kg)    GEN: Well nourished, well developed in no acute distress while sitting in chair.  NECK: No JVD; No carotid bruits CARDIAC: ***RRR, no murmurs, rubs, gallops RESPIRATORY:  Clear to auscultation without rales, wheezing or rhonchi  ABDOMEN: Soft, non-tender, non-distended EXTREMITIES:  No edema; No deformity   ASSESSMENT AND PLAN: .   ***    {Are you ordering a CV Procedure (e.g. stress test, cath, DCCV, TEE, etc)?   Press F2        :[id number]}  Dispo: ***  Signed, [name], PA-C

## 2024-10-18 ENCOUNTER — Ambulatory Visit: Admitting: Physician Assistant

## 2024-10-29 ENCOUNTER — Other Ambulatory Visit: Payer: Self-pay

## 2024-10-29 ENCOUNTER — Observation Stay (HOSPITAL_COMMUNITY)
Admission: EM | Admit: 2024-10-29 | Discharge: 2024-10-30 | Disposition: A | Discharge status: Home / Self Care | Attending: Internal Medicine | Admitting: Internal Medicine

## 2024-10-29 ENCOUNTER — Emergency Department (HOSPITAL_COMMUNITY)

## 2024-10-29 ENCOUNTER — Encounter (HOSPITAL_COMMUNITY): Payer: Self-pay

## 2024-10-29 DIAGNOSIS — Z79899 Other long term (current) drug therapy: Secondary | ICD-10-CM | POA: Diagnosis not present

## 2024-10-29 DIAGNOSIS — E66813 Obesity, class 3: Secondary | ICD-10-CM

## 2024-10-29 DIAGNOSIS — E1122 Type 2 diabetes mellitus with diabetic chronic kidney disease: Secondary | ICD-10-CM | POA: Insufficient documentation

## 2024-10-29 DIAGNOSIS — Z794 Long term (current) use of insulin: Secondary | ICD-10-CM | POA: Diagnosis not present

## 2024-10-29 DIAGNOSIS — I480 Paroxysmal atrial fibrillation: Secondary | ICD-10-CM | POA: Diagnosis not present

## 2024-10-29 DIAGNOSIS — Z951 Presence of aortocoronary bypass graft: Secondary | ICD-10-CM | POA: Diagnosis not present

## 2024-10-29 DIAGNOSIS — Z7901 Long term (current) use of anticoagulants: Secondary | ICD-10-CM

## 2024-10-29 DIAGNOSIS — N1832 Chronic kidney disease, stage 3b: Secondary | ICD-10-CM | POA: Diagnosis not present

## 2024-10-29 DIAGNOSIS — I25709 Atherosclerosis of coronary artery bypass graft(s), unspecified, with unspecified angina pectoris: Secondary | ICD-10-CM

## 2024-10-29 DIAGNOSIS — I130 Hypertensive heart and chronic kidney disease with heart failure and stage 1 through stage 4 chronic kidney disease, or unspecified chronic kidney disease: Secondary | ICD-10-CM | POA: Insufficient documentation

## 2024-10-29 DIAGNOSIS — I10 Essential (primary) hypertension: Secondary | ICD-10-CM | POA: Diagnosis not present

## 2024-10-29 DIAGNOSIS — E782 Mixed hyperlipidemia: Secondary | ICD-10-CM | POA: Diagnosis not present

## 2024-10-29 DIAGNOSIS — R079 Chest pain, unspecified: Secondary | ICD-10-CM | POA: Diagnosis not present

## 2024-10-29 DIAGNOSIS — I2510 Atherosclerotic heart disease of native coronary artery without angina pectoris: Secondary | ICD-10-CM | POA: Insufficient documentation

## 2024-10-29 DIAGNOSIS — Z87891 Personal history of nicotine dependence: Secondary | ICD-10-CM | POA: Insufficient documentation

## 2024-10-29 DIAGNOSIS — I5032 Chronic diastolic (congestive) heart failure: Secondary | ICD-10-CM

## 2024-10-29 DIAGNOSIS — N400 Enlarged prostate without lower urinary tract symptoms: Secondary | ICD-10-CM | POA: Insufficient documentation

## 2024-10-29 DIAGNOSIS — Z6841 Body Mass Index (BMI) 40.0 and over, adult: Secondary | ICD-10-CM | POA: Insufficient documentation

## 2024-10-29 LAB — CBC WITH DIFFERENTIAL/PLATELET
Abs Immature Granulocytes: 0.02 K/uL (ref 0.00–0.07)
Basophils Absolute: 0 K/uL (ref 0.0–0.1)
Basophils Relative: 0 %
Eosinophils Absolute: 0.1 K/uL (ref 0.0–0.5)
Eosinophils Relative: 2 %
HCT: 33.5 % — ABNORMAL LOW (ref 39.0–52.0)
Hemoglobin: 10.7 g/dL — ABNORMAL LOW (ref 13.0–17.0)
Immature Granulocytes: 0 %
Lymphocytes Relative: 16 %
Lymphs Abs: 0.8 K/uL (ref 0.7–4.0)
MCH: 30.3 pg (ref 26.0–34.0)
MCHC: 31.9 g/dL (ref 30.0–36.0)
MCV: 94.9 fL (ref 80.0–100.0)
Monocytes Absolute: 0.5 K/uL (ref 0.1–1.0)
Monocytes Relative: 10 %
Neutro Abs: 3.3 K/uL (ref 1.7–7.7)
Neutrophils Relative %: 72 %
Platelets: 196 K/uL (ref 150–400)
RBC: 3.53 MIL/uL — ABNORMAL LOW (ref 4.22–5.81)
RDW: 13.2 % (ref 11.5–15.5)
WBC: 4.6 K/uL (ref 4.0–10.5)
nRBC: 0 % (ref 0.0–0.2)

## 2024-10-29 LAB — BASIC METABOLIC PANEL WITH GFR
Anion gap: 8 (ref 5–15)
BUN: 33 mg/dL — ABNORMAL HIGH (ref 8–23)
CO2: 29 mmol/L (ref 22–32)
Calcium: 9.1 mg/dL (ref 8.9–10.3)
Chloride: 104 mmol/L (ref 98–111)
Creatinine, Ser: 1.75 mg/dL — ABNORMAL HIGH (ref 0.61–1.24)
GFR, Estimated: 39 mL/min — ABNORMAL LOW
Glucose, Bld: 139 mg/dL — ABNORMAL HIGH (ref 70–99)
Potassium: 3.8 mmol/L (ref 3.5–5.1)
Sodium: 141 mmol/L (ref 135–145)

## 2024-10-29 LAB — CBG MONITORING, ED
Glucose-Capillary: 29 mg/dL — CL (ref 70–99)
Glucose-Capillary: 81 mg/dL (ref 70–99)

## 2024-10-29 LAB — HEPATIC FUNCTION PANEL
ALT: 40 U/L (ref 0–44)
AST: 27 U/L (ref 15–41)
Albumin: 3.7 g/dL (ref 3.5–5.0)
Alkaline Phosphatase: 67 U/L (ref 38–126)
Bilirubin, Direct: 0.2 mg/dL (ref 0.0–0.2)
Indirect Bilirubin: 0.3 mg/dL (ref 0.3–0.9)
Total Bilirubin: 0.5 mg/dL (ref 0.0–1.2)
Total Protein: 7.2 g/dL (ref 6.5–8.1)

## 2024-10-29 LAB — TROPONIN T, HIGH SENSITIVITY
Troponin T High Sensitivity: 37 ng/L — ABNORMAL HIGH (ref 0–19)
Troponin T High Sensitivity: 47 ng/L — ABNORMAL HIGH (ref 0–19)

## 2024-10-29 LAB — LIPASE, BLOOD: Lipase: 45 U/L (ref 11–51)

## 2024-10-29 LAB — MAGNESIUM: Magnesium: 2.1 mg/dL (ref 1.7–2.4)

## 2024-10-29 MED ORDER — METOPROLOL TARTRATE 25 MG PO TABS
25.0000 mg | ORAL_TABLET | Freq: Two times a day (BID) | ORAL | Status: DC
  Administered 2024-10-29 – 2024-10-30 (×2): 25 mg via ORAL
  Filled 2024-10-29 (×2): qty 1

## 2024-10-29 MED ORDER — ONDANSETRON HCL 4 MG/2ML IJ SOLN: 4.0000 mg | Freq: Four times a day (QID) | INTRAMUSCULAR | Status: DC | PRN

## 2024-10-29 MED ORDER — EZETIMIBE 10 MG PO TABS
10.0000 mg | ORAL_TABLET | Freq: Every day | ORAL | Status: DC
  Administered 2024-10-29 – 2024-10-30 (×2): 10 mg via ORAL
  Filled 2024-10-29 (×2): qty 1

## 2024-10-29 MED ORDER — MYCOPHENOLATE MOFETIL 250 MG PO CAPS
500.0000 mg | ORAL_CAPSULE | Freq: Two times a day (BID) | ORAL | Status: DC
  Administered 2024-10-29 – 2024-10-30 (×2): 500 mg via ORAL
  Filled 2024-10-29 (×2): qty 2

## 2024-10-29 MED ORDER — GABAPENTIN 300 MG PO CAPS
300.0000 mg | ORAL_CAPSULE | Freq: Three times a day (TID) | ORAL | Status: DC
  Administered 2024-10-29 – 2024-10-30 (×2): 300 mg via ORAL
  Filled 2024-10-29 (×2): qty 1

## 2024-10-29 MED ORDER — ATORVASTATIN CALCIUM 40 MG PO TABS
80.0000 mg | ORAL_TABLET | Freq: Every day | ORAL | Status: DC
  Administered 2024-10-29 – 2024-10-30 (×2): 80 mg via ORAL
  Filled 2024-10-29 (×2): qty 2

## 2024-10-29 MED ORDER — ACETAMINOPHEN 650 MG RE SUPP: 650.0000 mg | Freq: Four times a day (QID) | RECTAL | Status: DC | PRN

## 2024-10-29 MED ORDER — TORSEMIDE 20 MG PO TABS
60.0000 mg | ORAL_TABLET | Freq: Every day | ORAL | Status: DC
  Administered 2024-10-30: 60 mg via ORAL
  Filled 2024-10-29: qty 3

## 2024-10-29 MED ORDER — ASPIRIN 81 MG PO CHEW
324.0000 mg | CHEWABLE_TABLET | Freq: Once | ORAL | Status: AC
  Administered 2024-10-29: 324 mg via ORAL
  Filled 2024-10-29: qty 4

## 2024-10-29 MED ORDER — TAMSULOSIN HCL 0.4 MG PO CAPS
0.4000 mg | ORAL_CAPSULE | Freq: Every evening | ORAL | Status: DC
  Administered 2024-10-29: 0.4 mg via ORAL
  Filled 2024-10-29: qty 1

## 2024-10-29 MED ORDER — ISOSORBIDE DINITRATE 20 MG PO TABS
30.0000 mg | ORAL_TABLET | Freq: Four times a day (QID) | ORAL | Status: DC
  Administered 2024-10-29 – 2024-10-30 (×3): 30 mg via ORAL
  Filled 2024-10-29 (×3): qty 2

## 2024-10-29 MED ORDER — ONDANSETRON HCL 4 MG PO TABS: 4.0000 mg | ORAL_TABLET | Freq: Four times a day (QID) | ORAL | Status: DC | PRN

## 2024-10-29 MED ORDER — ALUM & MAG HYDROXIDE-SIMETH 200-200-20 MG/5ML PO SUSP
30.0000 mL | Freq: Once | ORAL | Status: AC
  Administered 2024-10-29: 30 mL via ORAL
  Filled 2024-10-29: qty 30

## 2024-10-29 MED ORDER — LOSARTAN POTASSIUM 25 MG PO TABS
25.0000 mg | ORAL_TABLET | Freq: Every day | ORAL | Status: DC
  Administered 2024-10-30: 25 mg via ORAL
  Filled 2024-10-29: qty 1

## 2024-10-29 MED ORDER — LIDOCAINE VISCOUS HCL 2 % MT SOLN
15.0000 mL | Freq: Once | OROMUCOSAL | Status: AC
  Administered 2024-10-29: 15 mL via ORAL
  Filled 2024-10-29: qty 15

## 2024-10-29 MED ORDER — APIXABAN 2.5 MG PO TABS
2.5000 mg | ORAL_TABLET | Freq: Two times a day (BID) | ORAL | Status: DC
  Administered 2024-10-29 – 2024-10-30 (×2): 2.5 mg via ORAL
  Filled 2024-10-29 (×2): qty 1

## 2024-10-29 MED ORDER — ACETAMINOPHEN 325 MG PO TABS: 650.0000 mg | ORAL_TABLET | Freq: Four times a day (QID) | ORAL | Status: DC | PRN

## 2024-10-29 NOTE — ED Notes (Signed)
CBG 81.  

## 2024-10-29 NOTE — ED Notes (Signed)
CBG 29 

## 2024-10-29 NOTE — H&P (Signed)
 " History and Physical    Patient: [name] FMW:[id number] DOB: December 21, 1940 DOA: 10/29/2024 DOS: the patient was seen and examined on 10/29/2024 PCP: Darren Monetta RAMAN, MD  Patient coming from: Home  Chief Complaint:  Chief Complaint  Patient presents with   Chest Pain   HPI: [name] is a 83 year old male with a history of  HFpEF, multivessel CAD status post CABG in 2007, PAF on apixaban , lower extremity lymphedema, CKD stage IIIb, hypertension, hyperlipidemia, diabetes mellitus type 2 presenting with substernal chest pain.  He stated that he woke up with the chest discomfort around 5:30 AM on 10/29/2024.  Initially he did not want to come to the emergency department.  He laid down for short period of time and woke back up at 6:15 AM with continued chest discomfort.  As result, he agreed to come to the hospital.  He denies any fevers, chills, coughing, hemoptysis, nausea, vomiting, diarrhea, abdominal pain.  He compliant with all his medications. Notably, the patient was recently admitted to the hospital from 09/17/2024 to 09/20/2024 for acute on chronic HFpEF.  His discharge weight was 265.2 pounds. He was discharged with torsemide  60 mg daily.  He states that his weight is down to 253 pounds on 10/29/2024.   In the ED, the patient is afebrile and hemodynamically stable with oxygen  saturation 100% room air.  WBC 4.6, hemoglobin 10.7, platelets 196.  Sodium 141, potassium 3.8, bicarbonate 29, serum creatinine 1.75.  LFTs were unremarkable.  Chest x-ray negative for infiltrates or edema.  EKG showed sinus rhythm without any concerning ST or T wave changes.  Troponin 37>> 47. EDP spoke with cardiology who will consult the patient.  Patient and wife prefer to have inpatient stress test.  Review of Systems: As mentioned in the history of present illness. All other systems reviewed and are negative. Past Medical History:  Diagnosis Date   Anemia    C. difficile diarrhea    Chest pain  06/07/2018   CKD (chronic kidney disease), stage III (HCC)    Coronary artery disease    Status post CABG in 2007 [facility])   Diabetes mellitus with stage 1 chronic kidney disease (HCC) 05/20/2011   Dyspnea 02/20/2018   GERD (gastroesophageal reflux disease)    Glaucoma    Hyperlipidemia    Hypertension    Morbid obesity (HCC)    PAF (paroxysmal atrial fibrillation) (HCC)    Peripheral vascular disease    Sleep apnea    Stroke (HCC) 2008, 2014, 2015   Past Surgical History:  Procedure Laterality Date   CHOLECYSTECTOMY     CORONARY ARTERY BYPASS GRAFT     CYST EXCISION  12/2023   NO PAST SURGERIES     THYROID  SURGERY     Social History:  reports that he quit smoking about 43 years ago. His smoking use included cigarettes. He started smoking about 63 years ago. He has a 20 pack-year smoking history. He has never used smokeless tobacco. He reports that he does not drink alcohol  and does not use drugs.  Allergies[1]  Family History  Problem Relation Age of Onset   Diabetes Mother    Heart failure Mother    Hypertension Mother    Diabetes Father    Heart failure Father    Hypertension Father    Diabetes Sister    Heart failure Sister    Hypertension Sister    Hyperlipidemia Sister    Diabetes Brother    Heart  failure Brother    Hypertension Brother     Prior to Admission medications  Medication Sig Start Date End Date Taking? Authorizing Provider  amLODipine  (NORVASC ) 10 MG tablet Take 10 mg by mouth daily.   Yes [provider]  apixaban  (ELIQUIS ) 2.5 MG TABS tablet Take 2.5 mg by mouth 2 (two) times daily.   Yes [provider]  atorvastatin  (LIPITOR) 80 MG tablet Take 80 mg by mouth daily.   Yes [provider]  ezetimibe  (ZETIA ) 10 MG tablet Take 1 tablet (10 mg total) by mouth daily. 07/25/23  Yes Debera Jayson MATSU, MD  gabapentin  (NEURONTIN ) 300 MG capsule Take 300 mg by mouth 3 (three) times daily.   Yes [provider]  isosorbide  dinitrate (ISORDIL ) 30 MG tablet Take 30 mg by mouth 4 (four) times daily.   Yes [provider]  losartan  (COZAAR ) 25 MG tablet Take 25 mg by mouth daily.   Yes [provider]  metoprolol  tartrate (LOPRESSOR ) 50 MG tablet Take 0.5 tablets (25 mg total) by mouth 2 (two) times daily. 09/20/24  Yes Shah, Pratik D, DO  mycophenolate  (CELLCEPT ) 500 MG tablet Take by mouth 2 (two) times daily.   Yes [provider]  Semaglutide (OZEMPIC, 0.25 OR 0.5 MG/DOSE, [place]) Inject 0.25 mg into the skin once a week. 02/26/22  Yes [provider]  tamsulosin  (FLOMAX ) 0.4 MG CAPS capsule Take 0.4 mg by mouth every evening.   Yes [provider]  torsemide  (DEMADEX ) 20 MG tablet Take 3 tablets (60 mg total) by mouth daily. Take 60 mg daily and take an extra 60mg  if worsening swelling or weight gain of over 3lbs noted in 24 hours. 09/20/24  Yes Shah, Pratik D, DO  acetaminophen  (TYLENOL ) 650 MG CR tablet Take 1,300 mg by mouth every 8 (eight) hours as needed for pain.    [provider]  albuterol  (VENTOLIN  HFA) 108 (90 Base) MCG/ACT inhaler Inhale 1-2 puffs into the lungs every 6 (six) hours as needed for shortness of breath. 03/23/22   [provider]  alum & mag hydroxide-simeth (MAALOX MAX) 400-400-40 MG/5ML suspension Take 15 mLs by mouth as needed for indigestion. 12/20/23   Kommor, Madison, MD  diclofenac Sodium (VOLTAREN) 1 % GEL Apply 2 g topically every 6 (six) hours as needed (pain). 04/10/24   [provider]  Finerenone  (KERENDIA ) 10 MG TABS Take 1 tablet (10 mg total) by mouth daily. 09/21/24   Debera Jayson MATSU, MD  glucose-Vitamin C  4-0.006 GM CHEW chewable tablet Chew 4 tablets by mouth as directed. CHEW FOUR TABLETS BY MOUTH AS DIRECTED BY YOUR PROVIDER (REPEAT EVERY 15 MINUTES IF BLOOD SUGAR LESS THAN 70) 03/09/22   [provider]  insulin  aspart protamine- aspart (NOVOLOG  MIX 70/30) (70-30) 100  UNIT/ML injection Inject 0.4 mLs (40 Units total) into the skin 2 (two) times daily with a meal. Patient taking differently: Inject 40-50 Units into the skin See admin instructions. Inject 40 units in the morning and 50 in the evening 07/29/22   Johnson, Clanford L, MD  lidocaine  (LIDODERM ) 5 % Place 1 patch onto the skin daily. 07/09/24   [provider]  Vitamin D , Ergocalciferol , (DRISDOL) 1.25 MG (50000 UNIT) CAPS capsule Take 50,000 Units by mouth once a week. 06/02/22   [provider]    Physical Exam: Vitals:   10/29/24 1015 10/29/24 1030 10/29/24 1045 10/29/24 1100  BP: (!) 96/58 (!) 111/55 (!) 93/32 109/61  Pulse:    ROLLEN)  57  Resp: 13 13 19 18   Temp:      TempSrc:      SpO2:    100%  Weight:      Height:       GENERAL:  A&O x 3, NAD, well developed, cooperative, follows commands HEENT: [place]/AT, No thrush, No icterus, No oral ulcers Neck:  No neck mass, No meningismus, soft, supple CV: RRR, no S3, no S4, no rub, no JVD Lungs:  CTA, no wheeze, no rhonchi, good air movement Abd: soft/NT +BS, nondistended Ext: 1 + LE edema, no lymphangitis, no cyanosis, no rashes Neuro:  CN II-XII intact, strength 4/5 in RUE, RLE, strength 4/5 LUE, LLE; sensation intact bilateral; no dysmetria; babinski equivocal  Data Reviewed: Data reviewed above in the history  Assessment and Plan: Chest pain - Cardiology consulted - Planning for stress test 10/30/2024 - Troponin 37>> 47 - Continue Imdur  - Continue metoprolol  tartrate  Chronic HFpEF - Clinically compensated - 09/19/2024 echo EF 55 to 60%, grade 1 DD, RV not visualized - Continue home dose torsemide   CKD stage IIIb - Baseline creatinine 1.7-2.0 - Monitor BMP  Coronary artery disease -Status post CABG 2007 - Continue Imdur  -Continue metoprolol  -Already on apixaban  for atrial fibrillation  Paroxysmal atrial fibrillation -Continue apixaban  -Continue metoprolol  tartrate  Essential hypertension -Continue  metoprolol  tartrate and losartan  -Holding amlodipine  temporarily  Diabetes mellitus type 2 -05/13/2022 hemoglobin A1c 7.8 -Recheck A1c  Mixed hyperlipidemia -Continue statin and Zetia   Morbid obesity -BMI 40.33 -Lifestyle modification     Advance Care Planning: FULL  Consults: cardiology  Family Communication: wife 12/29  Severity of Illness: The appropriate patient status for this patient is OBSERVATION. Observation status is judged to be reasonable and necessary in order to provide the required intensity of service to ensure the patient's safety. The patient's presenting symptoms, physical exam findings, and initial radiographic and laboratory data in the context of their medical condition is felt to place them at decreased risk for further clinical deterioration. Furthermore, it is anticipated that the patient will be medically stable for discharge from the hospital within 2 midnights of admission.   Author: Alm Schneider, MD 10/29/2024 1:07 PM  For on call review [email or web address].     [1]  Allergies Allergen Reactions   Benazepril Other (See Comments)    Unknown    Lisinopril Other (See Comments)    Unknown    Ivp Dye [Iodinated Contrast Media] Rash   "

## 2024-10-29 NOTE — ED Provider Notes (Signed)
 "  EMERGENCY DEPARTMENT AT [facility] Provider Note   CSN: [id number] Arrival date & time: 10/29/24  9282     Patient presents with: Chest Pain   TODD JELINSKI is a 83 y.o. male.   HPI Patient presents for chest pain.  Medical history includes HTN, HLD, DM, CAD, atrial fibrillation, OSA, CKD, CHF, BPH, CVA.  He underwent CABG in 2007.  He underwent stress test in 2021 with results showing low risk.  His cardiologist is Dr. Debera.  He takes daily dose of Eliquis .  Last dose was midday yesterday.  Last night, he was in his normal state of health.  This morning sometime around 6 AM, he was awakened with central chest pain.  His wife encouraged him to call an ambulance but patient refused.  [name] subsequently heard him grunting as if he was in more pain.  At that point, patient was agreeable to come to the hospital.  Patient states that maximal pain at home was 6-7/10 in severity.  Currently, it is 3/10.  Pain does not radiate.  He denies any associated symptoms.    Prior to Admission medications  Medication Sig Start Date End Date Taking? Authorizing Provider  amLODipine  (NORVASC ) 10 MG tablet Take 10 mg by mouth daily.   Yes [provider]  apixaban  (ELIQUIS ) 2.5 MG TABS tablet Take 2.5 mg by mouth 2 (two) times daily.   Yes [provider]  atorvastatin  (LIPITOR) 80 MG tablet Take 80 mg by mouth daily.   Yes [provider]  ezetimibe  (ZETIA ) 10 MG tablet Take 1 tablet (10 mg total) by mouth daily. 07/25/23  Yes Debera Jayson MATSU, MD  gabapentin  (NEURONTIN ) 300 MG capsule Take 300 mg by mouth 3 (three) times daily.   Yes [provider]  isosorbide  dinitrate (ISORDIL ) 30 MG tablet Take 30 mg by mouth 4 (four) times daily.   Yes [provider]  losartan  (COZAAR ) 25 MG tablet Take 25 mg by mouth daily.   Yes [provider]  metoprolol  tartrate (LOPRESSOR ) 50 MG tablet Take 0.5 tablets (25 mg total) by  mouth 2 (two) times daily. 09/20/24  Yes Shah, Pratik D, DO  mycophenolate  (CELLCEPT ) 500 MG tablet Take by mouth 2 (two) times daily.   Yes [provider]  Semaglutide (OZEMPIC, 0.25 OR 0.5 MG/DOSE, [place]) Inject 0.25 mg into the skin once a week. 02/26/22  Yes [provider]  tamsulosin  (FLOMAX ) 0.4 MG CAPS capsule Take 0.4 mg by mouth every evening.   Yes [provider]  torsemide  (DEMADEX ) 20 MG tablet Take 3 tablets (60 mg total) by mouth daily. Take 60 mg daily and take an extra 60mg  if worsening swelling or weight gain of over 3lbs noted in 24 hours. 09/20/24  Yes Shah, Pratik D, DO  acetaminophen  (TYLENOL ) 650 MG CR tablet Take 1,300 mg by mouth every 8 (eight) hours as needed for pain.    [provider]  albuterol  (VENTOLIN  HFA) 108 (90 Base) MCG/ACT inhaler Inhale 1-2 puffs into the lungs every 6 (six) hours as needed for shortness of breath. 03/23/22   [provider]  alum & mag hydroxide-simeth (MAALOX MAX) 400-400-40 MG/5ML suspension Take 15 mLs by mouth as needed for indigestion. 12/20/23   Kommor, Madison, MD  diclofenac Sodium (VOLTAREN) 1 % GEL Apply 2 g topically every 6 (six) hours as needed (pain). 04/10/24   [provider]  Finerenone  (KERENDIA ) 10 MG TABS Take 1 tablet (10 mg  total) by mouth daily. 09/21/24   Debera Jayson MATSU, MD  glucose-Vitamin C  4-0.006 GM CHEW chewable tablet Chew 4 tablets by mouth as directed. CHEW FOUR TABLETS BY MOUTH AS DIRECTED BY YOUR PROVIDER (REPEAT EVERY 15 MINUTES IF BLOOD SUGAR LESS THAN 70) 03/09/22   [provider]  insulin  aspart protamine- aspart (NOVOLOG  MIX 70/30) (70-30) 100 UNIT/ML injection Inject 0.4 mLs (40 Units total) into the skin 2 (two) times daily with a meal. Patient taking differently: Inject 40-50 Units into the skin See admin instructions. Inject 40 units in the morning and 50 in the evening 07/29/22   Johnson, Clanford L, MD  lidocaine  (LIDODERM ) 5 % Place 1 patch  onto the skin daily. 07/09/24   [provider]  Vitamin D , Ergocalciferol , (DRISDOL) 1.25 MG (50000 UNIT) CAPS capsule Take 50,000 Units by mouth once a week. 06/02/22   [provider]    Allergies: Benazepril, Lisinopril, and Ivp dye [iodinated contrast media]    Review of Systems  Cardiovascular:  Positive for chest pain.  All other systems reviewed and are negative.   Updated Vital Signs BP 109/61 (BP Location: Right Arm)   Pulse (!) 57   Temp 98.5 F (36.9 C) (Oral)   Resp 18   Ht 5' 8 (1.727 m)   Wt 120.3 kg   SpO2 100%   BMI 40.33 kg/m   Physical Exam Vitals and nursing note reviewed.  Constitutional:      General: He is not in acute distress.    Appearance: He is well-developed. He is not ill-appearing, toxic-appearing or diaphoretic.  HENT:     Head: Normocephalic and atraumatic.  Eyes:     Conjunctiva/sclera: Conjunctivae normal.  Cardiovascular:     Rate and Rhythm: Normal rate and regular rhythm.  Pulmonary:     Effort: Pulmonary effort is normal. No tachypnea, accessory muscle usage or respiratory distress.     Breath sounds: Normal breath sounds.  Chest:     Chest wall: No tenderness.  Abdominal:     Palpations: Abdomen is soft.     Tenderness: There is no abdominal tenderness.  Musculoskeletal:        General: No swelling. Normal range of motion.     Cervical back: Normal range of motion and neck supple.     Right lower leg: Edema present.     Left lower leg: Edema present.  Skin:    General: Skin is warm and dry.     Coloration: Skin is not cyanotic or pale.  Neurological:     General: No focal deficit present.     Mental Status: He is alert and oriented to person, place, and time.  Psychiatric:        Mood and Affect: Mood normal.        Behavior: Behavior normal.     (all labs ordered are listed, but only abnormal results are displayed) Labs Reviewed  BASIC METABOLIC PANEL WITH GFR - Abnormal; Notable for the following  components:      Result Value   Glucose, Bld 139 (*)    BUN 33 (*)    Creatinine, Ser 1.75 (*)    GFR, Estimated 39 (*)    All other components within normal limits  CBC WITH DIFFERENTIAL/PLATELET - Abnormal; Notable for the following components:   RBC 3.53 (*)    Hemoglobin 10.7 (*)    HCT 33.5 (*)    All other components within normal limits  TROPONIN T, HIGH SENSITIVITY -  Abnormal; Notable for the following components:   Troponin T High Sensitivity 37 (*)    All other components within normal limits  TROPONIN T, HIGH SENSITIVITY - Abnormal; Notable for the following components:   Troponin T High Sensitivity 47 (*)    All other components within normal limits  LIPASE, BLOOD  MAGNESIUM   HEPATIC FUNCTION PANEL  CBG MONITORING, ED    EKG: EKG Interpretation Date/Time:  Monday October 29 2024 07:28:31 EST Ventricular Rate:  65 PR Interval:  238 QRS Duration:  103 QT Interval:  389 QTC Calculation: 405 R Axis:   -63  Text Interpretation: Sinus rhythm Prolonged PR interval Inferior infarct, old Consider anterior infarct Confirmed by Melvenia Motto [phone]) on 10/29/2024 7:47:12 AM  Radiology: DG Chest Portable 1 View Result Date: 10/29/2024 EXAM: 1 VIEW(S) XRAY OF THE CHEST 10/29/2024 07:41:00 AM COMPARISON: 10/04/2024 CLINICAL HISTORY: Chest Pain FINDINGS: LUNGS AND PLEURA: No focal pulmonary opacity. No pleural effusion. No pneumothorax. HEART AND MEDIASTINUM: Vascular clips over mediastinum. No acute abnormality of the cardiac and mediastinal silhouettes. BONES AND SOFT TISSUES: Surgical clips at thoracic inlet. Surgical clips in right neck. Intact sternotomy wires. No acute osseous abnormality. IMPRESSION: 1. No acute findings. Electronically signed by: Waddell Calk MD 10/29/2024 07:49 AM EST RP Workstation: [id number]     Procedures   Medications Ordered in the ED  aspirin  chewable tablet 324 mg (324 mg Oral Given 10/29/24 0800)  alum & mag hydroxide-simeth  (MAALOX/MYLANTA) 200-200-20 MG/5ML suspension 30 mL (30 mLs Oral Given 10/29/24 0800)    And  lidocaine  (XYLOCAINE ) 2 % viscous mouth solution 15 mL (15 mLs Oral Given 10/29/24 0800)                                    Medical Decision Making Amount and/or Complexity of Data Reviewed Labs: ordered. Radiology: ordered.  Risk OTC drugs. Prescription drug management.   This patient presents to the ED for concern of chest pain, this involves an extensive number of treatment options, and is a complaint that carries with it a high risk of complications and morbidity.  The differential diagnosis includes ACS, GERD, pericarditis, anxiety, musculoskeletal etiology   Co morbidities / Chronic conditions that complicate the patient evaluation  HTN, HLD, DM, CAD, atrial fibrillation, OSA, CKD, CHF, BPH, CVA   Additional history obtained:  Additional history obtained from EMR External records from outside source obtained and reviewed including patient's wife   Lab Tests:  I Ordered, and personally interpreted labs.  The pertinent results include: Baseline anemia, no leukocytosis, creatinine is mildly improved from baseline, electrolytes are normal.  Troponin is slightly elevated with minimal increase on repeat.   Imaging Studies ordered:  I ordered imaging studies including chest x-ray I independently visualized and interpreted imaging which showed no acute findings I agree with the radiologist interpretation   Cardiac Monitoring: / EKG:  The patient was maintained on a cardiac monitor.  I personally viewed and interpreted the cardiac monitored which showed an underlying rhythm of: Sinus rhythm   Problem List / ED Course / Critical interventions / Medication management  Patient presenting for central chest pain.  Onset was approximately 6 AM this morning.  It did wake him up from sleep.  On arrival in the ED, patient is well-appearing.  He states that his pain has eased off but  remains 3/10 in severity.  No tenderness is present on exam.  Current  breathing is unlabored.  EKG does not show evidence of STEMI.  Workup was initiated.  Patient is given GI cocktail for treatment of possible GERD etiology.  Prior to receiving GI cocktail, he did have resolution of pain.  Initial troponin does appear to be close to his baseline.  While in the ED, he did have an episode of hypoglycemia.  This is in the setting of taking his insulin  prior to arrival and not eating.  He was given glucose containing drink.  Repeat troponin showed minimal increase to 47.  Patient remained pain-free while in the ED.  I spoke with cardiologist on-call, Dr. Mallipeddi, who does feel like patient needs repeat stress test.  This can be done inpatient or outpatient depending on patient's comfort level.  I spoke with the patient and his wife who do not feel comfortable going home at this time.  Cardiology was informed.  Patient to be admitted. I ordered medication including ASA for chest pain; GI cocktail for possible GERD etiology Reevaluation of the patient after these medicines showed that the patient stayed the same I have reviewed the patients home medicines and have made adjustments as needed   Consultations Obtained:  I requested consultation with the cardiologist, Dr. Mallipeddi,  and discussed lab and imaging findings as well as pertinent plan - they recommend: Stress test, inpatient or outpatient depending on patient preference.   Social Determinants of Health:  Lives at home with wife     Final diagnoses:  Chest pain, unspecified type    ED Discharge Orders     None          Melvenia Motto, MD 10/29/24 1225  "

## 2024-10-29 NOTE — Consult Note (Signed)
 "   CARDIOLOGY CONSULT NOTE    Patient ID: [name]; [id number]; 1941-05-09   Admit date: 10/29/2024 Date of Consult: 10/29/2024  Primary Care Provider: Darren Monetta RAMAN, MD Primary Cardiologist:  Primary Electrophysiologist:     Patient Profile:   [name] is a 83 y.o. male with a hx of CAD s/p CABG who is being seen today for the evaluation of chest pain at the request of [name].  History of Present Illness:   [name] is a 83 year old M known to have CAD s/p CABG in 2007, paroxysmal atrial fibrillation, HTN, DM 2, CKD stage IIIb, HLD presented to the ER with chest pain.  Patient woke up this morning with chest pain that lasted for some time.  He tried to go back to sleep but he had recurrent chest pain.  This prompted ER visit.  This whole episode lasted for about 2 hours.  After arrival to the ER, chest pain completely resolved.  Does not have any other symptoms of DOE, orthopnea, PND, leg swelling. He had ER visit around 3 weeks ago for b/l leg swelling and DOE that resolved with increased p.o diuretics at home. He reports this is the first time he ever had chest pain. In 2007, wife reported that he had normal stress test but he underwent LHC (as a part of pre-op) that showed multivessel CAD and then CABG.  Past Medical History:  Diagnosis Date   Anemia    C. difficile diarrhea    Chest pain 06/07/2018   CKD (chronic kidney disease), stage III (HCC)    Coronary artery disease    Status post CABG in 2007 [facility])   Diabetes mellitus with stage 1 chronic kidney disease (HCC) 05/20/2011   Dyspnea 02/20/2018   GERD (gastroesophageal reflux disease)    Glaucoma    Hyperlipidemia    Hypertension    Morbid obesity (HCC)    PAF (paroxysmal atrial fibrillation) (HCC)    Peripheral vascular disease    Sleep apnea    Stroke (HCC) 2008, 2014, 2015    Past Surgical History:  Procedure Laterality Date   CHOLECYSTECTOMY     CORONARY  ARTERY BYPASS GRAFT     CYST EXCISION  12/2023   NO PAST SURGERIES     THYROID  SURGERY         Inpatient Medications: Scheduled Meds:  Continuous Infusions:  PRN Meds:   Allergies:   Allergies[1]  Social History:   Social History   Socioeconomic History   Marital status: Married    Spouse name: [name]   Number of children: Not on file   Years of education: Not on file   Highest education level: Not on file  Occupational History   Not on file  Tobacco Use   Smoking status: Former    Current packs/day: 0.00    Average packs/day: 1 pack/day for 20.0 years (20.0 ttl pk-yrs)    Types: Cigarettes    Start date: 1963    Quit date: 1983    Years since quitting: 43.0   Smokeless tobacco: Never  Vaping Use   Vaping status: Never Used  Substance and Sexual Activity   Alcohol  use: No   Drug use: No   Sexual activity: Not Currently  Other Topics Concern   Not on file  Social History Narrative   1 child, 4 step-children    Social Drivers of Health   Tobacco Use: Medium Risk (10/29/2024)   Patient History  Smoking Tobacco Use: Former    Smokeless Tobacco Use: Never    Passive Exposure: Not on Actuary Strain: Not on file  Food Insecurity: No Food Insecurity (09/17/2024)   Epic    Worried About Programme Researcher, Broadcasting/film/video in the Last Year: Never true    Ran Out of Food in the Last Year: Never true  Transportation Needs: No Transportation Needs (09/17/2024)   Epic    Lack of Transportation (Medical): No    Lack of Transportation (Non-Medical): No  Physical Activity: Not on file  Stress: Not on file  Social Connections: Moderately Integrated (09/17/2024)   Social Connection and Isolation Panel    Frequency of Communication with Friends and Family: Once a week    Frequency of Social Gatherings with Friends and Family: Once a week    Attends Religious Services: More than 4 times per year    Active Member of [employer] or Organizations: Yes    Attends Tax Inspector Meetings: More than 4 times per year    Marital Status: Married  Catering Manager Violence: Not At Risk (09/17/2024)   Epic    Fear of Current or Ex-Partner: No    Emotionally Abused: No    Physically Abused: No    Sexually Abused: No  Depression (PHQ2-9): High Risk (04/13/2022)   Depression (PHQ2-9)    PHQ-2 Score: 14  Alcohol  Screen: Not on file  Housing: Low Risk (09/17/2024)   Epic    Unable to Pay for Housing in the Last Year: No    Number of Times Moved in the Last Year: 0    Homeless in the Last Year: No  Utilities: Not At Risk (09/17/2024)   Epic    Threatened with loss of utilities: No  Health Literacy: Not on file    Family History:    Family History  Problem Relation Age of Onset   Diabetes Mother    Heart failure Mother    Hypertension Mother    Diabetes Father    Heart failure Father    Hypertension Father    Diabetes Sister    Heart failure Sister    Hypertension Sister    Hyperlipidemia Sister    Diabetes Brother    Heart failure Brother    Hypertension Brother      ROS:  Please see the history of present illness.  ROS  All other ROS reviewed and negative.     Physical Exam/Data:   Vitals:   10/29/24 1045 10/29/24 1100 10/29/24 1115 10/29/24 1330  BP: (!) 93/32 109/61 113/65 (!) 115/53  Pulse:  (!) 57 64 (!) 59  Resp: 19 18  15   Temp:    (!) 97.4 F (36.3 C)  TempSrc:    Oral  SpO2:  100%  99%  Weight:      Height:       No intake or output data in the 24 hours ending 10/29/24 1529 Filed Weights   10/29/24 0725  Weight: 120.3 kg   Body mass index is 40.33 kg/m.  General:  Well nourished, well developed, in no acute distress HEENT: normal Lymph: no adenopathy Neck: no JVD Endocrine:  No thryomegaly Vascular: No carotid bruits; FA pulses 2+ bilaterally without bruits  Cardiac:  normal S1, S2; RRR; no murmur  Lungs:  clear to auscultation bilaterally, no wheezing, rhonchi or rales  Abd: soft, nontender, no hepatomegaly   Ext: no edema Musculoskeletal:  No deformities, BUE and BLE strength normal and equal  Skin: warm and dry  Neuro:  CNs 2-12 intact, no focal abnormalities noted Psych:  Normal affect   Laboratory Data:  Chemistry Recent Labs  Lab 10/29/24 0758  NA 141  K 3.8  CL 104  CO2 29  GLUCOSE 139*  BUN 33*  CREATININE 1.75*  CALCIUM  9.1  GFRNONAA 39*  ANIONGAP 8    Recent Labs  Lab 10/29/24 0758  PROT 7.2  ALBUMIN 3.7  AST 27  ALT 40  ALKPHOS 67  BILITOT 0.5   Hematology Recent Labs  Lab 10/29/24 0758  WBC 4.6  RBC 3.53*  HGB 10.7*  HCT 33.5*  MCV 94.9  MCH 30.3  MCHC 31.9  RDW 13.2  PLT 196   Cardiac EnzymesNo results for input(s): TROPONINI in the last 168 hours. No results for input(s): TROPIPOC in the last 168 hours.  BNPNo results for input(s): BNP, PROBNP in the last 168 hours.  DDimer No results for input(s): DDIMER in the last 168 hours.  Radiology/Studies:  DG Chest Portable 1 View Result Date: 10/29/2024 EXAM: 1 VIEW(S) XRAY OF THE CHEST 10/29/2024 07:41:00 AM COMPARISON: 10/04/2024 CLINICAL HISTORY: Chest Pain FINDINGS: LUNGS AND PLEURA: No focal pulmonary opacity. No pleural effusion. No pneumothorax. HEART AND MEDIASTINUM: Vascular clips over mediastinum. No acute abnormality of the cardiac and mediastinal silhouettes. BONES AND SOFT TISSUES: Surgical clips at thoracic inlet. Surgical clips in right neck. Intact sternotomy wires. No acute osseous abnormality. IMPRESSION: 1. No acute findings. Electronically signed by: Waddell Calk MD 10/29/2024 07:49 AM EST RP Workstation: [id number]    Assessment and Plan:   Chest pain - Chest pain woke him up from sleep. Lasted 2 hours. Resolved spontaneously.  - EKG showed NSR, Q waves in inferior leads and poor R wave progression (old). - Hs troponins mildly elevated.  37>> 47.  Trend troponins.  If troponins are significantly elevated, will transfer to [facility] for LHC.  If troponins are flat, Lexiscan  for  tomorrow AM. - NPO after midnight for Lexiscan  tomorrow AM.  CAD s/p CABG - Not on aspirin  use due to Eliquis . - Continue atorvastatin  80 mg nightly, Zetia  10 mg once daily.  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 - Continue metoprolol  tartrate 25 mg twice daily. - Continue Eliquis  2.5 mg twice daily.  Chronic diastolic heart failure - Recent ADHF around 3 weeks ago that resolved with increased p.o. diuretics at home. - Continue p.o. torsemide  60 mg once daily. - Continue losartan  25 mg once daily.  40 minutes spent in reviewing prior medical records, reports, more than 3 labs, discussion and documentation.  For questions or updates, please contact [facility] Please consult [email or web address] for contact info under Cardiology/STEMI.   Signed, Biddie Sebek Priya Callia Swim, MD 10/29/2024 3:29 PM     [1]  Allergies Allergen Reactions   Benazepril Other (See Comments)    Unknown    Lisinopril Other (See Comments)    Unknown    Ivp Dye [Iodinated Contrast Media] Rash   "

## 2024-10-29 NOTE — Hospital Course (Signed)
 Brief Narrative:   83 year old with history of CHF with preserved EF, CAD status post CABG 2007, paroxysmal A-fib on Eliquis , lower extremity lymphedema, CKD 3B, HTN, HLD, DM2 comes in with substernal chest pain started on the day of admission.  In the ER EKG did not show any acute changes, troponins were relatively flat, cardiology team was consulted.  Cardiology recommended Lexiscan  which was normal.  Medically stable for discharge per cardiology with same medications.  No further changes.  Will arrange for outpatient follow-up  Assessment & Plan:  Atypical chest pain CAD status post CABG 2007 - Troponins are flat, EKG unremarkable.  Lexiscan  is normal today.  Acute cleared by cardiology with same medications prior to arrival.  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  IV as needed   Diabetes mellitus type 2 -A1c-6.8 - Sliding scale and Accu-Cheks   Mixed hyperlipidemia -Continue statin and Zetia   BPH - Flomax    Morbid obesity -BMI 40.33 -Lifestyle modification  DVT prophylaxis: apixaban  (ELIQUIS ) tablet 2.5 mg Start: 10/29/24 2200 apixaban  (ELIQUIS ) tablet 2.5 mg      Code Status: Full Code Family Communication:   Management per cardiology: Discharge   PT Follow up Recs:   Subjective:  Patient had his stress test done this morning.  Awaiting results.  Spouse is present at bedside.  Does not have any complaints at this time.  Examination:  General exam: Appears calm and comfortable  Respiratory system: Clear to auscultation. Respiratory effort normal. Cardiovascular system: S1 & S2 heard, RRR. No JVD, murmurs, rubs, gallops or clicks. No pedal  edema. Gastrointestinal system: Abdomen is nondistended, soft and nontender. No organomegaly or masses felt. Normal bowel sounds heard. Central nervous system: Alert and oriented. No focal neurological deficits. Extremities: Symmetric 5 x 5 power. Skin: No rashes, lesions or ulcers Psychiatry: Judgement and insight appear normal. Mood & affect appropriate.

## 2024-10-29 NOTE — Plan of Care (Signed)
   Problem: Education: Goal: Knowledge of General Education information will improve Description Including pain rating scale, medication(s)/side effects and non-pharmacologic comfort measures Outcome: Progressing   Problem: Education: Goal: Knowledge of General Education information will improve Description Including pain rating scale, medication(s)/side effects and non-pharmacologic comfort measures Outcome: Progressing

## 2024-10-29 NOTE — ED Notes (Signed)
 Wife came to desk stating patient had taken his insulin  and did not eat breakfast, is now feeling ill. CBG 29. MD Dixon aware. Patient awake and tolerating PO. 2x juice given.

## 2024-10-29 NOTE — ED Notes (Signed)
Grape juice given  

## 2024-10-29 NOTE — ED Notes (Signed)
 ED Provider at bedside.

## 2024-10-29 NOTE — ED Triage Notes (Signed)
 Patient reports having chest pain that before 6am. Mid-chest pain no shortness of breath. Patient uses a walker and wheel chair at baseline.

## 2024-10-30 ENCOUNTER — Encounter (HOSPITAL_COMMUNITY): Payer: Self-pay | Admitting: Internal Medicine

## 2024-10-30 ENCOUNTER — Observation Stay (HOSPITAL_BASED_OUTPATIENT_CLINIC_OR_DEPARTMENT_OTHER)

## 2024-10-30 ENCOUNTER — Other Ambulatory Visit: Payer: Self-pay | Admitting: Physician Assistant

## 2024-10-30 DIAGNOSIS — I200 Unstable angina: Secondary | ICD-10-CM | POA: Diagnosis not present

## 2024-10-30 DIAGNOSIS — R079 Chest pain, unspecified: Secondary | ICD-10-CM | POA: Diagnosis not present

## 2024-10-30 DIAGNOSIS — I480 Paroxysmal atrial fibrillation: Secondary | ICD-10-CM | POA: Diagnosis not present

## 2024-10-30 DIAGNOSIS — Z7901 Long term (current) use of anticoagulants: Secondary | ICD-10-CM | POA: Diagnosis not present

## 2024-10-30 LAB — NM MYOCAR MULTI W/SPECT W/WALL MOTION / EF
Base ST Depression (mm): 0 mm
LV dias vol: 74 mL (ref 62–150)
LV sys vol: 27 mL
MPHR: 139 {beats}/min
Nuc Stress EF: 63 %
Peak HR: 78 {beats}/min
Percent HR: 56 %
RATE: 0.6
Rest HR: 66 {beats}/min
Rest Nuclear Isotope Dose: 11 mCi
SDS: 2
SRS: 9
SSS: 11
ST Depression (mm): 0 mm
Stress Nuclear Isotope Dose: 31 mCi
TID: 0.96

## 2024-10-30 LAB — BASIC METABOLIC PANEL WITH GFR
Anion gap: 8 (ref 5–15)
BUN: 38 mg/dL — ABNORMAL HIGH (ref 8–23)
CO2: 27 mmol/L (ref 22–32)
Calcium: 8.8 mg/dL — ABNORMAL LOW (ref 8.9–10.3)
Chloride: 105 mmol/L (ref 98–111)
Creatinine, Ser: 1.89 mg/dL — ABNORMAL HIGH (ref 0.61–1.24)
GFR, Estimated: 35 mL/min — ABNORMAL LOW
Glucose, Bld: 164 mg/dL — ABNORMAL HIGH (ref 70–99)
Potassium: 4.1 mmol/L (ref 3.5–5.1)
Sodium: 139 mmol/L (ref 135–145)

## 2024-10-30 LAB — HEMOGLOBIN A1C
Hgb A1c MFr Bld: 6.8 % — ABNORMAL HIGH (ref 4.8–5.6)
Mean Plasma Glucose: 148.46 mg/dL

## 2024-10-30 LAB — MAGNESIUM: Magnesium: 2.4 mg/dL (ref 1.7–2.4)

## 2024-10-30 LAB — GLUCOSE, CAPILLARY: Glucose-Capillary: 293 mg/dL — ABNORMAL HIGH (ref 70–99)

## 2024-10-30 MED ORDER — GLUCAGON HCL RDNA (DIAGNOSTIC) 1 MG IJ SOLR: 1.0000 mg | INTRAMUSCULAR | Status: DC | PRN

## 2024-10-30 MED ORDER — GUAIFENESIN 100 MG/5ML PO LIQD: 5.0000 mL | ORAL | Status: DC | PRN

## 2024-10-30 MED ORDER — IPRATROPIUM-ALBUTEROL 0.5-2.5 (3) MG/3ML IN SOLN: 3.0000 mL | RESPIRATORY_TRACT | Status: DC | PRN

## 2024-10-30 MED ORDER — REGADENOSON 0.4 MG/5ML IV SOLN
INTRAVENOUS | Status: AC
  Administered 2024-10-30: 0.4 mg via INTRAVENOUS
  Filled 2024-10-30: qty 5

## 2024-10-30 MED ORDER — SENNOSIDES-DOCUSATE SODIUM 8.6-50 MG PO TABS: 1.0000 | ORAL_TABLET | Freq: Every evening | ORAL | Status: DC | PRN

## 2024-10-30 MED ORDER — TECHNETIUM TC 99M TETROFOSMIN IV KIT
30.0000 | PACK | Freq: Once | INTRAVENOUS | Status: AC | PRN
  Administered 2024-10-30: 31 via INTRAVENOUS

## 2024-10-30 MED ORDER — TECHNETIUM TC 99M TETROFOSMIN IV KIT
10.0000 | PACK | Freq: Once | INTRAVENOUS | Status: AC | PRN
  Administered 2024-10-30: 11 via INTRAVENOUS

## 2024-10-30 MED ORDER — METOPROLOL TARTRATE 5 MG/5ML IV SOLN: 5.0000 mg | INTRAVENOUS | Status: DC | PRN

## 2024-10-30 MED ORDER — HYDRALAZINE HCL 20 MG/ML IJ SOLN: 10.0000 mg | INTRAMUSCULAR | Status: DC | PRN

## 2024-10-30 MED ORDER — SODIUM CHLORIDE FLUSH 0.9 % IV SOLN
INTRAVENOUS | Status: AC
  Administered 2024-10-30: 10 mL via INTRAVENOUS
  Filled 2024-10-30: qty 10

## 2024-10-30 MED ORDER — INSULIN ASPART 100 UNIT/ML IJ SOLN
0.0000 [IU] | Freq: Three times a day (TID) | INTRAMUSCULAR | Status: DC
  Administered 2024-10-30: 6 [IU] via SUBCUTANEOUS
  Filled 2024-10-30: qty 1

## 2024-10-30 NOTE — Progress Notes (Signed)
" °  ° °  [name] presented for a Lexiscan  nuclear stress test today.  I [name], PA-C, provided direct supervision and was present during the stress portion of the study today, which was completed without significant symptoms, immediate complications, or acute ST/T changes on ECG.  Stress imaging is pending at this time.  Preliminary ECG findings may be listed in the chart, but the stress test result will not be finalized until perfusion imaging is complete.  [name], PA-C  10/30/2024, 8:48 AM    "

## 2024-10-30 NOTE — Progress Notes (Signed)
Patient discharged home with instructions given on medications and follow up visits,verbalized understanding .Prescriptions sent to Pharmacy of choice documented on AVS.IV discontinued,catheter intact. Accompanied by staff to an awaiting vehicle.

## 2024-10-30 NOTE — Discharge Summary (Signed)
 Physician Discharge Summary  [name] FMW:[id number] DOB: September 26, 1941 DOA: 10/29/2024  PCP: Darren Monetta RAMAN, MD  Admit date: 10/29/2024 Discharge date: 10/30/2024  Admitted From: Home Disposition: Home  Recommendations for Outpatient Follow-up:  outpatient follow-up with cardiology and PCP   Discharge Condition: Stable CODE STATUS: Full code Diet recommendation: Cardiac  Brief/Interim Summary: Brief Narrative:   83 year old with history of CHF with preserved EF, CAD status post CABG 2007, paroxysmal A-fib on Eliquis , lower extremity lymphedema, CKD 3B, HTN, HLD, DM2 comes in with substernal chest pain started on the day of admission.  In the ER EKG did not show any acute changes, troponins were relatively flat, cardiology team was consulted.  Cardiology recommended Lexiscan  which was normal.  Medically stable for discharge per cardiology with same medications.  No further changes.  Will arrange for outpatient follow-up  Assessment & Plan:  Atypical chest pain CAD status post CABG 2007 - Troponins are flat, EKG unremarkable.  Lexiscan  is normal today.  Acute cleared by cardiology with same medications prior to arrival.  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  IV as needed   Diabetes mellitus type 2 -A1c-6.8 - Sliding scale and Accu-Cheks   Mixed hyperlipidemia -Continue statin and Zetia   BPH - Flomax    Morbid obesity -BMI 40.33 -Lifestyle modification  DVT prophylaxis: apixaban  (ELIQUIS ) tablet 2.5 mg Start: 10/29/24 2200 apixaban  (ELIQUIS ) tablet 2.5 mg      Code Status: Full Code Family Communication:   Management per cardiology:  Discharge   PT Follow up Recs:   Subjective:  Patient had his stress test done this morning.  Awaiting results.  Spouse is present at bedside.  Does not have any complaints at this time.  Examination:  General exam: Appears calm and comfortable  Respiratory system: Clear to auscultation. Respiratory effort normal. Cardiovascular system: S1 & S2 heard, RRR. No JVD, murmurs, rubs, gallops or clicks. No pedal edema. Gastrointestinal system: Abdomen is nondistended, soft and nontender. No organomegaly or masses felt. Normal bowel sounds heard. Central nervous system: Alert and oriented. No focal neurological deficits. Extremities: Symmetric 5 x 5 power. Skin: No rashes, lesions or ulcers Psychiatry: Judgement and insight appear normal. Mood & affect appropriate.    Discharge Diagnoses:  Principal Problem:   Chest pain Active Problems:   Essential hypertension   Obesity, Class III, BMI 40-49.9 (morbid obesity) (HCC)   Chronic heart failure with preserved ejection fraction (HFpEF) (HCC)   Chronic kidney disease, stage 3b (HCC)   PAF (paroxysmal atrial fibrillation) (HCC)   Long term (current) use of anticoagulants      Discharge Exam: Vitals:   10/30/24 0420 10/30/24 1125  BP: (!) 136/53 132/76  Pulse: 63 61  Resp: 19 19  Temp: 98.1 F (36.7 C) 98.9 F (37.2 C)  SpO2: 95% 100%   Vitals:   10/29/24 2212 10/30/24 0054 10/30/24 0420 10/30/24 1125  BP: (!) 140/66 (!) 121/59 (!) 136/53 132/76  Pulse: 65 75 63 61  Resp:  19 19 19   Temp:  98.6 F (37 C) 98.1 F (36.7 C) 98.9 F (37.2 C)  TempSrc:  Oral Oral Oral  SpO2:  96% 95% 100%  Weight:      Height:  Discharge Instructions   Allergies as of 10/30/2024       Reactions   Benazepril Other (See Comments)   Unknown    Lisinopril Other (See Comments)   Unknown    Ivp Dye [iodinated Contrast Media] Rash        Medication List     TAKE these medications    acetaminophen  650 MG CR  tablet Commonly known as: TYLENOL  Take 1,300 mg by mouth every 8 (eight) hours as needed for pain.   albuterol  108 (90 Base) MCG/ACT inhaler Commonly known as: VENTOLIN  HFA Inhale 1-2 puffs into the lungs every 6 (six) hours as needed for shortness of breath.   amLODipine  10 MG tablet Commonly known as: NORVASC  Take 10 mg by mouth daily.   atorvastatin  80 MG tablet Commonly known as: LIPITOR Take 80 mg by mouth daily.   colchicine 0.6 MG tablet Take 1.2 mg by mouth 2 (two) times daily.   diclofenac Sodium 1 % Gel Commonly known as: VOLTAREN Apply 2 g topically every 6 (six) hours as needed (pain).   Eliquis  2.5 MG Tabs tablet Generic drug: apixaban  Take 2.5 mg by mouth 2 (two) times daily.   ezetimibe  10 MG tablet Commonly known as: ZETIA  Take 1 tablet (10 mg total) by mouth daily.   gabapentin  300 MG capsule Commonly known as: NEURONTIN  Take 300 mg by mouth 3 (three) times daily.   glucose-Vitamin C  4-0.006 GM Chew chewable tablet Chew 4 tablets by mouth as directed. CHEW FOUR TABLETS BY MOUTH AS DIRECTED BY YOUR PROVIDER (REPEAT EVERY 15 MINUTES IF BLOOD SUGAR LESS THAN 70)   insulin  aspart protamine- aspart (70-30) 100 UNIT/ML injection Commonly known as: NOVOLOG  MIX 70/30 Inject 0.4 mLs (40 Units total) into the skin 2 (two) times daily with a meal. What changed:  how much to take when to take this additional instructions   isosorbide  dinitrate 30 MG tablet Commonly known as: ISORDIL  Take 30 mg by mouth 4 (four) times daily.   lidocaine  5 % Commonly known as: LIDODERM  Place 1 patch onto the skin daily.   losartan  25 MG tablet Commonly known as: COZAAR  Take 25 mg by mouth daily.   Maalox Max 400-400-40 MG/5ML suspension Generic drug: alum & mag hydroxide-simeth Take 15 mLs by mouth as needed for indigestion.   metoprolol  tartrate 50 MG tablet Commonly known as: LOPRESSOR  Take 0.5 tablets (25 mg total) by mouth 2 (two) times daily.   mycophenolate   500 MG tablet Commonly known as: CELLCEPT  Take by mouth 2 (two) times daily.   OZEMPIC (0.25 OR 0.5 MG/DOSE) [place] Inject 0.25 mg into the skin once a week.   tamsulosin  0.4 MG Caps capsule Commonly known as: FLOMAX  Take 0.4 mg by mouth every evening.   torsemide  20 MG tablet Commonly known as: DEMADEX  Take 3 tablets (60 mg total) by mouth daily. Take 60 mg daily and take an extra 60mg  if worsening swelling or weight gain of over 3lbs noted in 24 hours.   Vitamin D  (Ergocalciferol ) 1.25 MG (50000 UNIT) Caps capsule Commonly known as: DRISDOL Take 50,000 Units by mouth once a week.        Allergies[1]  You were cared for by a hospitalist during your hospital stay. If you have any questions about your discharge medications or the care you received while you were in the hospital after you are discharged, you can call the unit and asked to speak with the hospitalist on call if the hospitalist that took care of  you is not available. Once you are discharged, your primary care physician will handle any further medical issues. Please note that no refills for any discharge medications will be authorized once you are discharged, as it is imperative that you return to your primary care physician (or establish a relationship with a primary care physician if you do not have one) for your aftercare needs so that they can reassess your need for medications and monitor your lab values.  You were cared for by a hospitalist during your hospital stay. If you have any questions about your discharge medications or the care you received while you were in the hospital after you are discharged, you can call the unit and asked to speak with the hospitalist on call if the hospitalist that took care of you is not available. Once you are discharged, your primary care physician will handle any further medical issues. Please note that NO REFILLS for any discharge medications will be authorized once you are discharged, as  it is imperative that you return to your primary care physician (or establish a relationship with a primary care physician if you do not have one) for your aftercare needs so that they can reassess your need for medications and monitor your lab values.  Please request your Prim.MD to go over all Hospital Tests and Procedure/Radiological results at the follow up, please get all Hospital records sent to your Prim MD by signing hospital release before you go home.  Get CBC, CMP, 2 view Chest X ray checked  by Primary MD during your next visit or SNF MD in 5-7 days ( we routinely change or add medications that can affect your baseline labs and fluid status, therefore we recommend that you get the mentioned basic workup next visit with your PCP, your PCP may decide not to get them or add new tests based on their clinical decision)  On your next visit with your primary care physician please Get Medicines reviewed and adjusted.  If you experience worsening of your admission symptoms, develop shortness of breath, life threatening emergency, suicidal or homicidal thoughts you must seek medical attention immediately by calling 911 or calling your MD immediately  if symptoms less severe.  You Must read complete instructions/literature along with all the possible adverse reactions/side effects for all the Medicines you take and that have been prescribed to you. Take any new Medicines after you have completely understood and accpet all the possible adverse reactions/side effects.   Do not drive, operate heavy machinery, perform activities at heights, swimming or participation in water activities or provide baby sitting services if your were admitted for syncope or siezures until you have seen by Primary MD or a Neurologist and advised to do so again.  Do not drive when taking Pain medications.   Procedures/Studies: NM Myocar Multi W/Spect W/Wall Motion / EF Result Date: 10/30/2024   No ST deviation was noted.  Pharmacological protocol is used. Baseline EKG showed normal sinus rhythm, Q waves in inferior leads, non-specific T wave inversions in lateral leads and poor R-wave progression.   LV perfusion is abnormal. There is evidence of ischemia. There is no evidence of infarction. There is a small reversible perfusion defect in the apex with normal wall motion, likely apical thinning artifact versus very minimal ischemia in the apex.   Left ventricular function is normal. Nuclear stress EF: 63%.   Findings are consistent with ischemia (versus artifact) and no infarction. The study is low risk.   DG Chest  Portable 1 View Result Date: 10/29/2024 EXAM: 1 VIEW(S) XRAY OF THE CHEST 10/29/2024 07:41:00 AM COMPARISON: 10/04/2024 CLINICAL HISTORY: Chest Pain FINDINGS: LUNGS AND PLEURA: No focal pulmonary opacity. No pleural effusion. No pneumothorax. HEART AND MEDIASTINUM: Vascular clips over mediastinum. No acute abnormality of the cardiac and mediastinal silhouettes. BONES AND SOFT TISSUES: Surgical clips at thoracic inlet. Surgical clips in right neck. Intact sternotomy wires. No acute osseous abnormality. IMPRESSION: 1. No acute findings. Electronically signed by: Waddell Calk MD 10/29/2024 07:49 AM EST RP Workstation: [id number]   US  Venous Img Lower Unilateral Left Result Date: 10/04/2024 CLINICAL DATA:  Swelling and pain in the left lower leg and ankle area. EXAM: LEFT LOWER EXTREMITY VENOUS DOPPLER ULTRASOUND TECHNIQUE: Gray-scale sonography with graded compression, as well as color Doppler and duplex ultrasound were performed to evaluate the lower extremity deep venous systems from the level of the common femoral vein and including the common femoral, femoral, profunda femoral, popliteal and calf veins including the posterior tibial, peroneal and gastrocnemius veins when visible. Spectral Doppler was utilized to evaluate flow at rest and with distal augmentation maneuvers in the common femoral, femoral and  popliteal veins. COMPARISON:  None Available. FINDINGS: Contralateral Common Femoral Vein: Respiratory phasicity is normal and symmetric with the symptomatic side. No evidence of thrombus. Normal compressibility. Common Femoral Vein: No evidence of thrombus. Normal compressibility, respiratory phasicity and response to augmentation. Saphenofemoral Junction: No evidence of thrombus. Normal compressibility and flow on color Doppler imaging. Profunda Femoral Vein: No evidence of thrombus. Normal compressibility and flow on color Doppler imaging. Femoral Vein: No evidence of thrombus. Normal compressibility, respiratory phasicity and response to augmentation. Popliteal Vein: Large echogenic focus along the popliteal vein wall is suggestive for a calcification and could be related to a chronic thrombus. Partial compressibility of the left popliteal vein. Normal color Doppler flow in the left popliteal vein. Calf Veins: No evidence of thrombus. Normal compressibility and flow on color Doppler imaging. Other Findings:  None. IMPRESSION: 1. No evidence for acute deep venous thrombosis in left lower extremity. 2. Incomplete compressibility in left popliteal vein with a large echogenic focus that most likely represents calcium . 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 Electronically Signed   By: Juliene Balder M.D.   On: 10/04/2024 16:26   DG Chest 2 View Result Date: 10/04/2024 EXAM: 2 VIEW(S) XRAY OF THE CHEST 10/04/2024 02:47:00 PM COMPARISON: Comparison 09/17/2024. CLINICAL HISTORY: SOB FINDINGS: LUNGS AND PLEURA: No focal pulmonary opacity. No pleural effusion. No pneumothorax. HEART AND MEDIASTINUM: Sternotomy wires are noted. No acute abnormality of the cardiac and mediastinal silhouettes. BONES AND SOFT TISSUES: No acute osseous abnormality. IMPRESSION: 1. No acute cardiopulmonary process. Electronically signed by: Lynwood Seip MD 10/04/2024 03:25 PM EST RP Workstation: [id number]     The results of  significant diagnostics from this hospitalization (including imaging, microbiology, ancillary and laboratory) are listed below for reference.     Microbiology: No results found for this or any previous visit (from the past 240 hours).   Labs: BNP (last 3 results) Recent Labs    12/20/23 0255  BNP 45.0   Basic Metabolic Panel: Recent Labs  Lab 10/29/24 0758 10/30/24 0407  NA 141 139  K 3.8 4.1  CL 104 105  CO2 29 27  GLUCOSE 139* 164*  BUN 33* 38*  CREATININE 1.75* 1.89*  CALCIUM  9.1 8.8*  MG 2.1 2.4   Liver Function Tests: Recent Labs  Lab 10/29/24 0758  AST 27  ALT 40  ALKPHOS 67  BILITOT 0.5  PROT 7.2  ALBUMIN 3.7   Recent Labs  Lab 10/29/24 0758  LIPASE 45   No results for input(s): AMMONIA in the last 168 hours. CBC: Recent Labs  Lab 10/29/24 0758  WBC 4.6  NEUTROABS 3.3  HGB 10.7*  HCT 33.5*  MCV 94.9  PLT 196   Cardiac Enzymes: No results for input(s): CKTOTAL, CKMB, CKMBINDEX, TROPONINI in the last 168 hours. BNP: Invalid input(s): POCBNP CBG: Recent Labs  Lab 10/29/24 1113 10/29/24 1135 10/30/24 1126  GLUCAP 29* 81 293*   D-Dimer No results for input(s): DDIMER in the last 72 hours. Hgb A1c Recent Labs    10/29/24 0758  HGBA1C 6.8*   Lipid Profile No results for input(s): CHOL, HDL, LDLCALC, TRIG, CHOLHDL, LDLDIRECT in the last 72 hours. Thyroid  function studies No results for input(s): TSH, T4TOTAL, T3FREE, THYROIDAB in the last 72 hours.  Invalid input(s): FREET3 Anemia work up No results for input(s): VITAMINB12, FOLATE, FERRITIN, TIBC, IRON, RETICCTPCT in the last 72 hours. Urinalysis    Component Value Date/Time   COLORURINE STRAW (A) 09/18/2024 1700   APPEARANCEUR CLEAR 09/18/2024 1700   LABSPEC 1.011 09/18/2024 1700   PHURINE 5.0 09/18/2024 1700   GLUCOSEU NEGATIVE 09/18/2024 1700   HGBUR SMALL (A) 09/18/2024 1700   BILIRUBINUR NEGATIVE 09/18/2024 1700    KETONESUR NEGATIVE 09/18/2024 1700   PROTEINUR NEGATIVE 09/18/2024 1700   UROBILINOGEN 0.2 03/28/2014 0145   NITRITE NEGATIVE 09/18/2024 1700   LEUKOCYTESUR NEGATIVE 09/18/2024 1700   Sepsis Labs Recent Labs  Lab 10/29/24 0758  WBC 4.6   Microbiology No results found for this or any previous visit (from the past 240 hours).   Time coordinating discharge:  I have spent 35 minutes face to face with the patient and on the ward discussing the patients care, assessment, plan and disposition with other care givers. >50% of the time was devoted counseling the patient about the risks and benefits of treatment/Discharge disposition and coordinating care.   SIGNED:   Burgess JAYSON Dare, MD  [facility] 10/30/2024, 12:25 PM   If 7PM-7AM, please contact night-coverage      [1]  Allergies Allergen Reactions   Benazepril Other (See Comments)    Unknown    Lisinopril Other (See Comments)    Unknown    Ivp Dye [Iodinated Contrast Media] Rash

## 2024-10-30 NOTE — Progress Notes (Signed)
" °  Transition of Care [facility]) Screening Note   Patient Details  Name: [name] Date of Birth: 30-Aug-1941   [facility] of [facility] CM/SW Contact:    Hoy DELENA Bigness, LCSW Phone Number: 10/30/2024, 9:06 AM    Transition of Care Department [facility]) has reviewed patient and no TOC needs have been identified at this time. We will continue to monitor patient advancement through interdisciplinary progression rounds. If new patient transition needs arise, please place a TOC consult.    10/30/24 0905  TOC Brief Assessment  Insurance and Status Reviewed  Patient has primary care physician Yes  Home environment has been reviewed From home w/ spouse  Prior level of function: Independent/modified independent  Prior/Current Home Services Current home services (Has aide through [employer])  Social Drivers of Health Review SDOH reviewed no interventions necessary  Readmission risk has been reviewed Yes  Transition of care needs no transition of care needs at this time   [place] notified of admission: [phone]  "

## 2024-10-30 NOTE — Plan of Care (Signed)
  Problem: Clinical Measurements: Goal: Will remain free from infection Outcome: Progressing Goal: Respiratory complications will improve Outcome: Progressing   Problem: Activity: Goal: Risk for activity intolerance will decrease Outcome: Progressing   Problem: Coping: Goal: Level of anxiety will decrease Outcome: Progressing   Problem: Pain Managment: Goal: General experience of comfort will improve and/or be controlled Outcome: Progressing   Problem: Safety: Goal: Ability to remain free from injury will improve Outcome: Progressing   Problem: Skin Integrity: Goal: Risk for impaired skin integrity will decrease Outcome: Progressing

## 2024-10-30 NOTE — Progress Notes (Signed)
 " PROGRESS NOTE    [name]  FMW:[id number] DOB: [birth year] DOA: 10/29/2024 PCP: Darren Monetta RAMAN, MD    Brief Narrative:   83 year old with history of CHF with preserved EF, CAD status post CABG 2007, paroxysmal A-fib on Eliquis , lower extremity lymphedema, CKD 3B, HTN, HLD, DM2 comes in with substernal chest pain started on the day of admission.  In the ER EKG did not show any acute changes, troponins were relatively flat, cardiology team was consulted.  Cardiology recommended Lexiscan   Assessment & Plan:  Atypical chest pain CAD status post CABG 2007 -Troponins are overall flat, EKG does not show any acute EKG changes.  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  IV as needed   Diabetes mellitus type 2 -A1c-pending - Sliding scale and Accu-Cheks   Mixed hyperlipidemia -Continue statin and Zetia   BPH - Flomax    Morbid obesity -BMI 40.33 -Lifestyle modification  DVT prophylaxis: apixaban  (ELIQUIS ) tablet 2.5 mg Start: 10/29/24 2200 apixaban  (ELIQUIS ) tablet 2.5 mg      Code Status: Full Code Family Communication:   Management per cardiology   PT Follow up Recs:   Subjective:  Patient had his stress test done this morning.  Awaiting results.  Spouse is present at bedside.  Does not have any complaints at this time.  Examination:  General exam: Appears calm and comfortable  Respiratory system: Clear to auscultation. Respiratory effort normal. Cardiovascular system: S1 & S2 heard, RRR. No JVD, murmurs, rubs, gallops or clicks. No pedal edema. Gastrointestinal system:  Abdomen is nondistended, soft and nontender. No organomegaly or masses felt. Normal bowel sounds heard. Central nervous system: Alert and oriented. No focal neurological deficits. Extremities: Symmetric 5 x 5 power. Skin: No rashes, lesions or ulcers Psychiatry: Judgement and insight appear normal. Mood & affect appropriate.                Diet Orders (From admission, onward)     Start     Ordered   10/29/24 1700  Diet Carb Modified Room service appropriate? Yes  Diet effective now       Question Answer Comment  Diet-HS Snack? Nothing   Calorie Level Medium 1600-2000   Fluid consistency: Thin   Room service appropriate? Yes      10/29/24 1659            Objective: Vitals:   10/29/24 2212 10/30/24 0054 10/30/24 0420 10/30/24 1125  BP: (!) 140/66 (!) 121/59 (!) 136/53 132/76  Pulse: 65 75 63 61  Resp:  19 19 19   Temp:  98.6 F (37 C) 98.1 F (36.7 C) 98.9 F (37.2 C)  TempSrc:  Oral Oral Oral  SpO2:  96% 95% 100%  Weight:      Height:       No intake or output data in the 24 hours ending 10/30/24 1132 Filed Weights   10/29/24 0725  Weight: 120.3 kg    Scheduled Meds:  apixaban   2.5 mg Oral BID   atorvastatin   80 mg Oral Daily   ezetimibe   10 mg Oral Daily   gabapentin   300 mg Oral TID   insulin  aspart  0-15  Units Subcutaneous TID WC   isosorbide  dinitrate  30 mg Oral QID   losartan   25 mg Oral Daily   metoprolol  tartrate  25 mg Oral BID   mycophenolate   500 mg Oral BID   tamsulosin   0.4 mg Oral QPM   torsemide   60 mg Oral Daily   Continuous Infusions:  Nutritional status     Body mass index is 40.33 kg/m.  Data Reviewed:   CBC: Recent Labs  Lab 10/29/24 0758  WBC 4.6  NEUTROABS 3.3  HGB 10.7*  HCT 33.5*  MCV 94.9  PLT 196   Basic Metabolic Panel: Recent Labs  Lab 10/29/24 0758 10/30/24 0407  NA 141 139  K 3.8 4.1  CL 104 105  CO2 29 27  GLUCOSE 139* 164*  BUN 33* 38*  CREATININE 1.75* 1.89*  CALCIUM  9.1 8.8*  MG 2.1  2.4   GFR: Estimated Creatinine Clearance: 38.7 mL/min (A) (by C-G formula based on SCr of 1.89 mg/dL (H)). Liver Function Tests: Recent Labs  Lab 10/29/24 0758  AST 27  ALT 40  ALKPHOS 67  BILITOT 0.5  PROT 7.2  ALBUMIN 3.7   Recent Labs  Lab 10/29/24 0758  LIPASE 45   No results for input(s): AMMONIA in the last 168 hours. Coagulation Profile: No results for input(s): INR, PROTIME in the last 168 hours. Cardiac Enzymes: No results for input(s): CKTOTAL, CKMB, CKMBINDEX, TROPONINI in the last 168 hours. BNP (last 3 results) Recent Labs    09/15/24 0931 09/17/24 1043 10/04/24 1507  PROBNP 334.0* 338.0* 133.0   HbA1C: Recent Labs    10/29/24 0758  HGBA1C 6.8*   CBG: Recent Labs  Lab 10/29/24 1113 10/29/24 1135 10/30/24 1126  GLUCAP 29* 81 293*   Lipid Profile: No results for input(s): CHOL, HDL, LDLCALC, TRIG, CHOLHDL, LDLDIRECT in the last 72 hours. Thyroid  Function Tests: No results for input(s): TSH, T4TOTAL, FREET4, T3FREE, THYROIDAB in the last 72 hours. Anemia Panel: No results for input(s): VITAMINB12, FOLATE, FERRITIN, TIBC, IRON, RETICCTPCT in the last 72 hours. Sepsis Labs: No results for input(s): PROCALCITON, LATICACIDVEN in the last 168 hours.  No results found for this or any previous visit (from the past 240 hours).       Radiology Studies: DG Chest Portable 1 View Result Date: 10/29/2024 EXAM: 1 VIEW(S) XRAY OF THE CHEST 10/29/2024 07:41:00 AM COMPARISON: 10/04/2024 CLINICAL HISTORY: Chest Pain FINDINGS: LUNGS AND PLEURA: No focal pulmonary opacity. No pleural effusion. No pneumothorax. HEART AND MEDIASTINUM: Vascular clips over mediastinum. No acute abnormality of the cardiac and mediastinal silhouettes. BONES AND SOFT TISSUES: Surgical clips at thoracic inlet. Surgical clips in right neck. Intact sternotomy wires. No acute osseous abnormality. IMPRESSION: 1. No acute findings.  Electronically signed by: Waddell Calk MD 10/29/2024 07:49 AM EST RP Workstation: [id number]           LOS: 0 days   Time spent= 35 mins    Burgess JAYSON Dare, MD [facility]  If 7PM-7AM, please contact night-coverage  10/30/2024, 11:32 AM  "

## 2024-10-30 NOTE — Progress Notes (Signed)
 "  Rounding Note   Patient Name: [name] Date of Encounter: 10/30/2024  [facility] Cardiologist: Jayson Sierras, MD   Subjective Denies any recurrent chest pain overnight. During stress test, patient develops very brief chest pain after 3 mins that last for < 5 seconds then resolved.   Scheduled Meds:  apixaban   2.5 mg Oral BID   atorvastatin   80 mg Oral Daily   ezetimibe   10 mg Oral Daily   gabapentin   300 mg Oral TID   isosorbide  dinitrate  30 mg Oral QID   losartan   25 mg Oral Daily   metoprolol  tartrate  25 mg Oral BID   mycophenolate   500 mg Oral BID   tamsulosin   0.4 mg Oral QPM   torsemide   60 mg Oral Daily   Continuous Infusions:  PRN Meds: acetaminophen  **OR** acetaminophen , ondansetron  **OR** ondansetron  (ZOFRAN ) IV, technetium tetrofosmin    Vital Signs  Vitals:   10/29/24 2103 10/29/24 2212 10/30/24 0054 10/30/24 0420  BP: (!) 140/66 (!) 140/66 (!) 121/59 (!) 136/53  Pulse: 65 65 75 63  Resp: 20  19 19   Temp: 98.2 F (36.8 C)  98.6 F (37 C) 98.1 F (36.7 C)  TempSrc: Oral  Oral Oral  SpO2: 98%  96% 95%  Weight:      Height:       No intake or output data in the 24 hours ending 10/30/24 0723    10/29/2024    7:25 AM 10/04/2024    2:28 PM 09/20/2024    3:25 AM  Last 3 Weights  Weight (lbs) 265 lb 3.4 oz 265 lb 3.4 oz 265 lb 3.4 oz  Weight (kg) 120.3 kg 120.3 kg 120.3 kg      Telemetry Not reviewed since in stress lab - Personally Reviewed   Physical Exam GEN: No acute distress.  Sitting in recliner for stress test.  Neck: No JVD Cardiac: RRR, no murmurs, rubs, or gallops.  Respiratory: Clear to auscultation bilaterally. GI: Soft, nontender, non-distended  MS: No edema; No deformity. Neuro:  Nonfocal  Psych: Normal affect   Labs High Sensitivity Troponin:  No results for input(s): TROPONINIHS in the last 720 hours.  Recent Labs  Lab 10/04/24 1507 10/04/24 1648 10/29/24 0758 10/29/24 1013  TRNPT 26* 25* 37* 47*        Chemistry Recent Labs  Lab 10/29/24 0758 10/30/24 0407  NA 141 139  K 3.8 4.1  CL 104 105  CO2 29 27  GLUCOSE 139* 164*  BUN 33* 38*  CREATININE 1.75* 1.89*  CALCIUM  9.1 8.8*  MG 2.1 2.4  PROT 7.2  --   ALBUMIN 3.7  --   AST 27  --   ALT 40  --   ALKPHOS 67  --   BILITOT 0.5  --   GFRNONAA 39* 35*  ANIONGAP 8 8    Lipids No results for input(s): CHOL, TRIG, HDL, LABVLDL, LDLCALC, CHOLHDL in the last 168 hours.  Hematology Recent Labs  Lab 10/29/24 0758  WBC 4.6  RBC 3.53*  HGB 10.7*  HCT 33.5*  MCV 94.9  MCH 30.3  MCHC 31.9  RDW 13.2  PLT 196   Thyroid  No results for input(s): TSH, FREET4 in the last 168 hours.  BNPNo results for input(s): BNP, PROBNP in the last 168 hours.  DDimer No results for input(s): DDIMER in the last 168 hours.   Radiology  DG Chest Portable 1 View Result Date: 10/29/2024 EXAM: 1 VIEW(S) XRAY OF THE  CHEST 10/29/2024 07:41:00 AM COMPARISON: 10/04/2024 CLINICAL HISTORY: Chest Pain FINDINGS: LUNGS AND PLEURA: No focal pulmonary opacity. No pleural effusion. No pneumothorax. HEART AND MEDIASTINUM: Vascular clips over mediastinum. No acute abnormality of the cardiac and mediastinal silhouettes. BONES AND SOFT TISSUES: Surgical clips at thoracic inlet. Surgical clips in right neck. Intact sternotomy wires. No acute osseous abnormality. IMPRESSION: 1. No acute findings. Electronically signed by: Waddell Calk MD 10/29/2024 07:49 AM EST RP Workstation: [id number]    Cardiac Studies Lexiscan  2021 Narrative & Impression  Lexiscan  is electrically negative for ischemi Myovue scan with probable normal perfusion, minimal soft tissue attenuation. No changes to suggest ischemia. LVEF calculated at 49% with septal hypokinesis; consider echo to further define LVEF / wall motion This is a low risk study.     Echo IMPRESSIONS 12/2021  1. Left ventricular ejection fraction, by estimation, is 55 to 60%. The  left ventricle has  normal function. The left ventricle has no regional  wall motion abnormalities. There is moderate concentric left ventricular  hypertrophy. Left ventricular  diastolic parameters were normal.   2. RV-RA gradient 26 mmHg suggesting normal estimated RVSP in setting of  normal CVP. Right ventricular systolic function is normal. The right  ventricular size is normal.   3. Left atrial size was mildly dilated.   4. The mitral valve is grossly normal. Trivial mitral valve  regurgitation.   5. The aortic valve is tricuspid. There is mild calcification of the  aortic valve. Aortic valve regurgitation is not visualized. Aortic valve  sclerosis/calcification is present, without any evidence of aortic  stenosis.   6. Unable to estimate CVP.   Comparison(s): No significant change from prior study. Prior images  reviewed side by side.   Patient Profile   82 y.o. male with a hx of multivessel CAD s/p CABG in 2007, paroxysmal atrial fibrillation, HTN, DM 2, CKD stage IIIb, HLD, carotid artery stenosis who is being seen today for the evaluation of chest pain at the request of Dr Melvenia.   Assessment & Plan  Chest pain  Presented with chest pain that woke him up for sleep, lasted for 2 hours and resolved spontaneously. Tn 37 > 47 EKG: NSR, Q waves in inferior leads and poor R wave progression (old).  Lexiscan  pending for today.  Denies any recurrent chest pain overnight. During stress test, patient develops very brief chest pain after 3 mins that last for < 5 seconds then resolved.   CAD s/p CABG  Continue Lipitor 80 mg nightly, Zetia  10 mg daily Not on ASA due to the need for [facility]  Paroxysmal atrial fibrillation No evidence of recurrent atrial fibrillation.  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 diuretics at home.  Continue p.o. torsemide  60 mg once daily and losartan  25 mg daily.  Carotid artery stenosis Carotid US  2023: Bilateral mild stenosis 1 to 49% Continue to monitor in outpatient setting     For questions or updates, please contact [facility] Please consult [email or web address] for contact info under       Signed, [name], PA-C  10/30/2024, 7:23 AM    "

## 2024-11-05 ENCOUNTER — Emergency Department (HOSPITAL_COMMUNITY)

## 2024-11-05 ENCOUNTER — Emergency Department (HOSPITAL_COMMUNITY)
Admission: EM | Admit: 2024-11-05 | Discharge: 2024-11-05 | Disposition: A | Discharge status: Home / Self Care | Attending: Emergency Medicine | Admitting: Emergency Medicine

## 2024-11-05 ENCOUNTER — Other Ambulatory Visit: Payer: Self-pay

## 2024-11-05 ENCOUNTER — Encounter (HOSPITAL_COMMUNITY): Payer: Self-pay | Admitting: *Deleted

## 2024-11-05 DIAGNOSIS — Z7901 Long term (current) use of anticoagulants: Secondary | ICD-10-CM | POA: Diagnosis not present

## 2024-11-05 DIAGNOSIS — D649 Anemia, unspecified: Secondary | ICD-10-CM | POA: Insufficient documentation

## 2024-11-05 DIAGNOSIS — E1122 Type 2 diabetes mellitus with diabetic chronic kidney disease: Secondary | ICD-10-CM | POA: Diagnosis not present

## 2024-11-05 DIAGNOSIS — I480 Paroxysmal atrial fibrillation: Secondary | ICD-10-CM | POA: Insufficient documentation

## 2024-11-05 DIAGNOSIS — Z79899 Other long term (current) drug therapy: Secondary | ICD-10-CM | POA: Insufficient documentation

## 2024-11-05 DIAGNOSIS — N189 Chronic kidney disease, unspecified: Secondary | ICD-10-CM | POA: Diagnosis not present

## 2024-11-05 DIAGNOSIS — I129 Hypertensive chronic kidney disease with stage 1 through stage 4 chronic kidney disease, or unspecified chronic kidney disease: Secondary | ICD-10-CM | POA: Diagnosis not present

## 2024-11-05 DIAGNOSIS — Z794 Long term (current) use of insulin: Secondary | ICD-10-CM | POA: Diagnosis not present

## 2024-11-05 DIAGNOSIS — R7989 Other specified abnormal findings of blood chemistry: Secondary | ICD-10-CM | POA: Diagnosis not present

## 2024-11-05 DIAGNOSIS — I209 Angina pectoris, unspecified: Secondary | ICD-10-CM | POA: Diagnosis not present

## 2024-11-05 DIAGNOSIS — Z8673 Personal history of transient ischemic attack (TIA), and cerebral infarction without residual deficits: Secondary | ICD-10-CM | POA: Diagnosis not present

## 2024-11-05 DIAGNOSIS — N289 Disorder of kidney and ureter, unspecified: Secondary | ICD-10-CM

## 2024-11-05 DIAGNOSIS — R079 Chest pain, unspecified: Secondary | ICD-10-CM | POA: Diagnosis present

## 2024-11-05 LAB — CBC WITH DIFFERENTIAL/PLATELET
Abs Immature Granulocytes: 0.02 K/uL (ref 0.00–0.07)
Basophils Absolute: 0 K/uL (ref 0.0–0.1)
Basophils Relative: 1 %
Eosinophils Absolute: 0.1 K/uL (ref 0.0–0.5)
Eosinophils Relative: 2 %
HCT: 35.4 % — ABNORMAL LOW (ref 39.0–52.0)
Hemoglobin: 11.3 g/dL — ABNORMAL LOW (ref 13.0–17.0)
Immature Granulocytes: 0 %
Lymphocytes Relative: 20 %
Lymphs Abs: 1.2 K/uL (ref 0.7–4.0)
MCH: 30 pg (ref 26.0–34.0)
MCHC: 31.9 g/dL (ref 30.0–36.0)
MCV: 93.9 fL (ref 80.0–100.0)
Monocytes Absolute: 0.7 K/uL (ref 0.1–1.0)
Monocytes Relative: 12 %
Neutro Abs: 3.9 K/uL (ref 1.7–7.7)
Neutrophils Relative %: 65 %
Platelets: 229 K/uL (ref 150–400)
RBC: 3.77 MIL/uL — ABNORMAL LOW (ref 4.22–5.81)
RDW: 13.1 % (ref 11.5–15.5)
WBC: 5.9 K/uL (ref 4.0–10.5)
nRBC: 0 % (ref 0.0–0.2)

## 2024-11-05 LAB — BASIC METABOLIC PANEL WITH GFR
Anion gap: 9 (ref 5–15)
BUN: 39 mg/dL — ABNORMAL HIGH (ref 8–23)
CO2: 28 mmol/L (ref 22–32)
Calcium: 9.1 mg/dL (ref 8.9–10.3)
Chloride: 101 mmol/L (ref 98–111)
Creatinine, Ser: 1.69 mg/dL — ABNORMAL HIGH (ref 0.61–1.24)
GFR, Estimated: 40 mL/min — ABNORMAL LOW
Glucose, Bld: 149 mg/dL — ABNORMAL HIGH (ref 70–99)
Potassium: 4.2 mmol/L (ref 3.5–5.1)
Sodium: 138 mmol/L (ref 135–145)

## 2024-11-05 LAB — TROPONIN T, HIGH SENSITIVITY
Troponin T High Sensitivity: 37 ng/L — ABNORMAL HIGH (ref 0–19)
Troponin T High Sensitivity: 39 ng/L — ABNORMAL HIGH (ref 0–19)

## 2024-11-05 MED ORDER — NITROGLYCERIN 0.4 MG SL SUBL
0.4000 mg | SUBLINGUAL_TABLET | SUBLINGUAL | Status: DC | PRN
  Administered 2024-11-05: 0.4 mg via SUBLINGUAL
  Filled 2024-11-05: qty 1

## 2024-11-05 MED ORDER — ASPIRIN 81 MG PO CHEW
324.0000 mg | CHEWABLE_TABLET | Freq: Once | ORAL | Status: AC
  Administered 2024-11-05: 324 mg via ORAL
  Filled 2024-11-05: qty 4

## 2024-11-05 MED ORDER — NITROGLYCERIN 0.4 MG SL SUBL: 0.4000 mg | SUBLINGUAL_TABLET | SUBLINGUAL | 0 refills | Status: AC | PRN

## 2024-11-05 NOTE — Discharge Instructions (Signed)
 If your chest pain comes back, take nitroglycerin -put 1 tablet under your tongue.  If symptoms are not completely relieved within 5 minutes, put a second nitroglycerin  under your tongue.  If symptoms still are not completely relieved within another 5 minutes, put a third nitroglycerin  under your tongue.  If symptoms are still not completely relieved after 3 nitroglycerin , return to the emergency department immediately.

## 2024-11-05 NOTE — ED Provider Notes (Signed)
 " [place] EMERGENCY DEPARTMENT AT [facility] Provider Note   CSN: [id number] Arrival date & time: 11/05/24  9687     Patient presents with: Chest Pain   [name] is a 84 y.o. male.   The history is provided by the patient.  Chest Pain  He has history of hypertension, diabetes, hyperlipidemia, chronic kidney disease, paroxysmal atrial fibrillation anticoagulated on apixaban , stroke, coronary artery disease, peripheral vascular disease and comes in because of chest pain which woke him up at about 12:30 AM.  Pain is in the lower sternal area and described as a pressure feeling without radiation.  There is no associated dyspnea, nausea, diaphoresis.  He had been in the emergency department 1 week ago with similar complaints and was kept overnight.  He is not on aspirin  because of anticoagulated state, had not been given a prescription for nitroglycerin .  He has not taken anything for his chest discomfort.  He states he has not noticed any change in his energy level, but his wife states that he has been more out of breath than normal.    Prior to Admission medications  Medication Sig Start Date End Date Taking? Authorizing Provider  acetaminophen  (TYLENOL ) 650 MG CR tablet Take 1,300 mg by mouth every 8 (eight) hours as needed for pain.    [provider]  albuterol  (VENTOLIN  HFA) 108 (90 Base) MCG/ACT inhaler Inhale 1-2 puffs into the lungs every 6 (six) hours as needed for shortness of breath. 03/23/22   [provider]  alum & mag hydroxide-simeth (MAALOX MAX) 400-400-40 MG/5ML suspension Take 15 mLs by mouth as needed for indigestion. 12/20/23   Kommor, Madison, MD  amLODipine  (NORVASC ) 10 MG tablet Take 10 mg by mouth daily.    [provider]  apixaban  (ELIQUIS ) 2.5 MG TABS tablet Take 2.5 mg by mouth 2 (two) times daily.    [provider]  atorvastatin  (LIPITOR) 80 MG tablet Take 80 mg by mouth daily.    [provider]   colchicine 0.6 MG tablet Take 1.2 mg by mouth 2 (two) times daily. 09/25/24   [provider]  diclofenac Sodium (VOLTAREN) 1 % GEL Apply 2 g topically every 6 (six) hours as needed (pain). 04/10/24   [provider]  ezetimibe  (ZETIA ) 10 MG tablet Take 1 tablet (10 mg total) by mouth daily. 07/25/23   Debera Jayson MATSU, MD  gabapentin  (NEURONTIN ) 300 MG capsule Take 300 mg by mouth 3 (three) times daily.    [provider]  glucose-Vitamin C  4-0.006 GM CHEW chewable tablet Chew 4 tablets by mouth as directed. CHEW FOUR TABLETS BY MOUTH AS DIRECTED BY YOUR PROVIDER (REPEAT EVERY 15 MINUTES IF BLOOD SUGAR LESS THAN 70) 03/09/22   [provider]  insulin  aspart protamine- aspart (NOVOLOG  MIX 70/30) (70-30) 100 UNIT/ML injection Inject 0.4 mLs (40 Units total) into the skin 2 (two) times daily with a meal. Patient taking differently: Inject 40-50 Units into the skin See admin instructions. Inject 40 units in the morning and 50 in the evening 07/29/22   Vicci Pen L, MD  isosorbide  dinitrate (ISORDIL ) 30 MG tablet Take 30 mg by mouth 4 (four) times daily.    [provider]  lidocaine  (LIDODERM ) 5 % Place 1 patch onto the skin daily. 07/09/24   [provider]  losartan  (COZAAR ) 25 MG tablet Take 25 mg by mouth daily.    [provider]  metoprolol  tartrate (LOPRESSOR ) 50 MG tablet Take 0.5  tablets (25 mg total) by mouth 2 (two) times daily. 09/20/24   Maree, Pratik D, DO  mycophenolate  (CELLCEPT ) 500 MG tablet Take by mouth 2 (two) times daily.    [provider]  Semaglutide (OZEMPIC, 0.25 OR 0.5 MG/DOSE, [place]) Inject 0.25 mg into the skin once a week. 02/26/22   [provider]  tamsulosin  (FLOMAX ) 0.4 MG CAPS capsule Take 0.4 mg by mouth every evening.    [provider]  torsemide  (DEMADEX ) 20 MG tablet Take 3 tablets (60 mg total) by mouth daily. Take 60 mg daily and take an extra 60mg  if worsening swelling or  weight gain of over 3lbs noted in 24 hours. 09/20/24   Maree, Pratik D, DO  Vitamin D , Ergocalciferol , (DRISDOL) 1.25 MG (50000 UNIT) CAPS capsule Take 50,000 Units by mouth once a week. 06/02/22   [provider]    Allergies: Benazepril, Lisinopril, and Ivp dye [iodinated contrast media]    Review of Systems  Cardiovascular:  Positive for chest pain.  All other systems reviewed and are negative.   Updated Vital Signs BP (!) 137/53 (BP Location: Right Arm)   Pulse 64   Temp 97.9 F (36.6 C) (Oral)   Resp 17   Ht 5' 8 (1.727 m)   Wt 120.3 kg   SpO2 100%   BMI 40.33 kg/m   Physical Exam Vitals and nursing note reviewed.   84 year old male, resting comfortably and in no acute distress. Vital signs are normal. Oxygen  saturation is 100%, which is normal. Head is normocephalic and atraumatic. PERRLA, EOMI.  Neck is nontender and supple without adenopathy or JVD. Back is nontender and there is no CVA tenderness. Lungs are clear without rales, wheezes, or rhonchi. Chest is nontender. Heart has regular rate and rhythm without murmur. Abdomen is soft, flat, nontender. Extremities have trace edema, full range of motion is present. Skin is warm and dry without rash. Neurologic: Mental status is normal, moves all extremities equally.  (all labs ordered are listed, but only abnormal results are displayed) Labs Reviewed  BASIC METABOLIC PANEL WITH GFR  CBC WITH DIFFERENTIAL/PLATELET  TROPONIN T, HIGH SENSITIVITY    EKG: EKG Interpretation Date/Time:  Monday November 05 2024 03:29:53 EST Ventricular Rate:  67 PR Interval:  215 QRS Duration:  117 QT Interval:  424 QTC Calculation: 448 R Axis:   -60  Text Interpretation: Sinus rhythm Borderline prolonged PR interval Left anterior fascicular block Low voltage, precordial leads Consider anterior infarct When compared with ECG of 10/29/2024, No significant change was found Confirmed by Raford Lenis ([id number]) on 11/05/2024 3:35:41  AM  Radiology: No results found.  Cardiac monitor shows normal sinus rhythm, per my interpretation.  Procedures   Medications Ordered in the ED  aspirin  chewable tablet 324 mg (has no administration in time range)  nitroGLYCERIN  (NITROSTAT ) SL tablet 0.4 mg (has no administration in time range)                                    Medical Decision Making Amount and/or Complexity of Data Reviewed Labs: ordered. Radiology: ordered.  Risk OTC drugs. Prescription drug management.   Chest discomfort in patient with history of known coronary artery disease.  Pulmonary embolism unlikely given chronic anticoagulation.  Differential also includes noncardiac causes such as GERD and esophageal spasm.  I have reviewed his past records and do note hospitalization 10/29/2024-10/30/2024 for chest pain and  referred to cardiology for follow-up.  He did have a nuclear stress test on 10/30/2024 with no ST or T changes but with a perfusion deficit suggesting ischemia in the LAD distribution.  I have reviewed his electrocardiogram today, and my interpretation is first-degree AV block, left anterior fascicular block unchanged from prior.  I have ordered a dose of aspirin  and nitroglycerin  as well as laboratory workup and x-ray.  I have reviewed his laboratory test, my interpretation is stable renal insufficiency, stable elevated troponin, stable anemia.  Repeat troponin is unchanged.  Chest x-ray shows elevated left hemidiaphragm but unchanged from prior.  I independently viewed the image, and agree with radiologist's interpretation.  He had complete relief of chest discomfort with 1 nitroglycerin .  At this point, with stable troponin, I feel that his symptoms are angina pectoris and does not require hospitalization.  I am discharging him with a prescription for nitroglycerin  and I am giving an ambulatory referral to cardiology.  Strict return precautions were given for return for any chest discomfort not  relieved with 3 nitroglycerin .     Final diagnoses:  Angina pectoris  Elevated troponin  Renal insufficiency  Normochromic normocytic anemia  Anticoagulated on apixaban     ED Discharge Orders          Ordered    Ambulatory referral to Cardiology       Comments: If you have not heard from the Cardiology office within the next 72 hours please call [phone].   11/05/24 0729    nitroGLYCERIN  (NITROSTAT ) 0.4 MG SL tablet  Every 5 min PRN        11/05/24 0729               Raford Lenis, MD 11/05/24 0732  "

## 2024-11-05 NOTE — ED Triage Notes (Signed)
 Pt c/o mid sternal chest pain that started at 1am; pt was seen here last week for same complaint with no significant findings  Pt describes the pain as pressure

## 2024-11-05 NOTE — ED Notes (Signed)
Urine sample provided if needed. 

## 2024-11-06 ENCOUNTER — Encounter (HOSPITAL_COMMUNITY): Payer: Self-pay | Admitting: Emergency Medicine

## 2024-11-06 ENCOUNTER — Emergency Department (HOSPITAL_COMMUNITY)
Admission: EM | Admit: 2024-11-06 | Discharge: 2024-11-06 | Disposition: A | Discharge status: Home / Self Care | Attending: Emergency Medicine | Admitting: Emergency Medicine

## 2024-11-06 ENCOUNTER — Other Ambulatory Visit: Payer: Self-pay

## 2024-11-06 ENCOUNTER — Emergency Department (HOSPITAL_COMMUNITY)

## 2024-11-06 ENCOUNTER — Telehealth: Payer: Self-pay | Admitting: Cardiology

## 2024-11-06 DIAGNOSIS — M25521 Pain in right elbow: Secondary | ICD-10-CM | POA: Insufficient documentation

## 2024-11-06 DIAGNOSIS — Z7901 Long term (current) use of anticoagulants: Secondary | ICD-10-CM | POA: Diagnosis not present

## 2024-11-06 DIAGNOSIS — N189 Chronic kidney disease, unspecified: Secondary | ICD-10-CM | POA: Diagnosis not present

## 2024-11-06 DIAGNOSIS — Z79899 Other long term (current) drug therapy: Secondary | ICD-10-CM | POA: Insufficient documentation

## 2024-11-06 DIAGNOSIS — I209 Angina pectoris, unspecified: Secondary | ICD-10-CM | POA: Diagnosis not present

## 2024-11-06 DIAGNOSIS — R079 Chest pain, unspecified: Secondary | ICD-10-CM | POA: Diagnosis present

## 2024-11-06 DIAGNOSIS — I129 Hypertensive chronic kidney disease with stage 1 through stage 4 chronic kidney disease, or unspecified chronic kidney disease: Secondary | ICD-10-CM | POA: Diagnosis not present

## 2024-11-06 DIAGNOSIS — E1122 Type 2 diabetes mellitus with diabetic chronic kidney disease: Secondary | ICD-10-CM | POA: Diagnosis not present

## 2024-11-06 DIAGNOSIS — Z794 Long term (current) use of insulin: Secondary | ICD-10-CM | POA: Insufficient documentation

## 2024-11-06 DIAGNOSIS — N289 Disorder of kidney and ureter, unspecified: Secondary | ICD-10-CM

## 2024-11-06 DIAGNOSIS — D649 Anemia, unspecified: Secondary | ICD-10-CM | POA: Diagnosis not present

## 2024-11-06 DIAGNOSIS — R7989 Other specified abnormal findings of blood chemistry: Secondary | ICD-10-CM | POA: Insufficient documentation

## 2024-11-06 DIAGNOSIS — I480 Paroxysmal atrial fibrillation: Secondary | ICD-10-CM | POA: Diagnosis not present

## 2024-11-06 LAB — CBC WITH DIFFERENTIAL/PLATELET
Abs Immature Granulocytes: 0.02 K/uL (ref 0.00–0.07)
Basophils Absolute: 0 K/uL (ref 0.0–0.1)
Basophils Relative: 0 %
Eosinophils Absolute: 0.1 K/uL (ref 0.0–0.5)
Eosinophils Relative: 2 %
HCT: 34.7 % — ABNORMAL LOW (ref 39.0–52.0)
Hemoglobin: 11 g/dL — ABNORMAL LOW (ref 13.0–17.0)
Immature Granulocytes: 0 %
Lymphocytes Relative: 22 %
Lymphs Abs: 1.2 K/uL (ref 0.7–4.0)
MCH: 30 pg (ref 26.0–34.0)
MCHC: 31.7 g/dL (ref 30.0–36.0)
MCV: 94.6 fL (ref 80.0–100.0)
Monocytes Absolute: 0.8 K/uL (ref 0.1–1.0)
Monocytes Relative: 14 %
Neutro Abs: 3.3 K/uL (ref 1.7–7.7)
Neutrophils Relative %: 62 %
Platelets: 211 K/uL (ref 150–400)
RBC: 3.67 MIL/uL — ABNORMAL LOW (ref 4.22–5.81)
RDW: 13.1 % (ref 11.5–15.5)
WBC: 5.5 K/uL (ref 4.0–10.5)
nRBC: 0 % (ref 0.0–0.2)

## 2024-11-06 LAB — BASIC METABOLIC PANEL WITH GFR
Anion gap: 8 (ref 5–15)
BUN: 38 mg/dL — ABNORMAL HIGH (ref 8–23)
CO2: 29 mmol/L (ref 22–32)
Calcium: 9 mg/dL (ref 8.9–10.3)
Chloride: 102 mmol/L (ref 98–111)
Creatinine, Ser: 1.72 mg/dL — ABNORMAL HIGH (ref 0.61–1.24)
GFR, Estimated: 39 mL/min — ABNORMAL LOW
Glucose, Bld: 193 mg/dL — ABNORMAL HIGH (ref 70–99)
Potassium: 4.7 mmol/L (ref 3.5–5.1)
Sodium: 139 mmol/L (ref 135–145)

## 2024-11-06 LAB — TROPONIN T, HIGH SENSITIVITY
Troponin T High Sensitivity: 39 ng/L — ABNORMAL HIGH (ref 0–19)
Troponin T High Sensitivity: 40 ng/L — ABNORMAL HIGH (ref 0–19)

## 2024-11-06 MED ORDER — ACETAMINOPHEN 325 MG PO TABS
650.0000 mg | ORAL_TABLET | Freq: Once | ORAL | Status: AC
  Administered 2024-11-06: 650 mg via ORAL
  Filled 2024-11-06: qty 2

## 2024-11-06 NOTE — Discharge Instructions (Signed)
 Apply ice to your elbow.  Ice is to be applied for 30 minutes at a time, 4 times a day.  You may take acetaminophen  as needed for pain in your elbow.  Follow previous instructions regarding episodes of chest pain.  Return if you are having any problems at home.

## 2024-11-06 NOTE — ED Provider Notes (Signed)
 " [place] EMERGENCY DEPARTMENT AT [facility] Provider Note   CSN: [id number] Arrival date & time: 11/06/24  9785     Patient presents with: Chest Pain   [name] is a 84 y.o. male.   The history is provided by the patient.  Chest Pain  He has history of hypertension, diabetes, hyperlipidemia, chronic kidney disease, paroxysmal atrial fibrillation anticoagulated on apixaban , stroke, coronary artery disease, peripheral vascular disease and comes in because of chest pain and pain in the right elbow.  He had been in the emergency department yesterday for chest pain and was given a prescription for nitroglycerin .  He states that pain occurred at rest and was completely relieved with 1 nitroglycerin .  However, after the chest pain cleared, he started having pain in the right elbow.  He states that there is 1 spot that he can process that is tender.  He denies dyspnea, nausea, diaphoresis.    Prior to Admission medications  Medication Sig Start Date End Date Taking? Authorizing Provider  acetaminophen  (TYLENOL ) 650 MG CR tablet Take 1,300 mg by mouth every 8 (eight) hours as needed for pain.    [provider]  albuterol  (VENTOLIN  HFA) 108 (90 Base) MCG/ACT inhaler Inhale 1-2 puffs into the lungs every 6 (six) hours as needed for shortness of breath. 03/23/22   [provider]  alum & mag hydroxide-simeth (MAALOX MAX) 400-400-40 MG/5ML suspension Take 15 mLs by mouth as needed for indigestion. 12/20/23   Kommor, Madison, MD  amLODipine  (NORVASC ) 10 MG tablet Take 10 mg by mouth daily.    [provider]  apixaban  (ELIQUIS ) 2.5 MG TABS tablet Take 2.5 mg by mouth 2 (two) times daily.    [provider]  atorvastatin  (LIPITOR) 80 MG tablet Take 80 mg by mouth daily.    [provider]  colchicine 0.6 MG tablet Take 1.2 mg by mouth 2 (two) times daily. 09/25/24   [provider]  diclofenac Sodium (VOLTAREN) 1 % GEL Apply 2 g  topically every 6 (six) hours as needed (pain). 04/10/24   [provider]  ezetimibe  (ZETIA ) 10 MG tablet Take 1 tablet (10 mg total) by mouth daily. 07/25/23   Debera Jayson MATSU, MD  gabapentin  (NEURONTIN ) 300 MG capsule Take 300 mg by mouth 3 (three) times daily.    [provider]  glucose-Vitamin C  4-0.006 GM CHEW chewable tablet Chew 4 tablets by mouth as directed. CHEW FOUR TABLETS BY MOUTH AS DIRECTED BY YOUR PROVIDER (REPEAT EVERY 15 MINUTES IF BLOOD SUGAR LESS THAN 70) 03/09/22   [provider]  insulin  aspart protamine- aspart (NOVOLOG  MIX 70/30) (70-30) 100 UNIT/ML injection Inject 0.4 mLs (40 Units total) into the skin 2 (two) times daily with a meal. Patient taking differently: Inject 40-50 Units into the skin See admin instructions. Inject 40 units in the morning and 50 in the evening 07/29/22   Vicci Pen L, MD  isosorbide  dinitrate (ISORDIL ) 30 MG tablet Take 30 mg by mouth 4 (four) times daily.    [provider]  lidocaine  (LIDODERM ) 5 % Place 1 patch onto the skin daily. 07/09/24   [provider]  losartan  (COZAAR ) 25 MG tablet Take 25 mg by mouth daily.    [provider]  metoprolol  tartrate (LOPRESSOR ) 50 MG tablet Take 0.5 tablets (25 mg total) by mouth 2 (two) times daily. 09/20/24   Maree, Pratik D, DO  mycophenolate  (CELLCEPT ) 500 MG tablet Take by mouth 2 (two) times  daily.    [provider]  nitroGLYCERIN  (NITROSTAT ) 0.4 MG SL tablet Place 1 tablet (0.4 mg total) under the tongue every 5 (five) minutes as needed for chest pain. 11/05/24   Raford Lenis, MD  Semaglutide (OZEMPIC, 0.25 OR 0.5 MG/DOSE, [place]) Inject 0.25 mg into the skin once a week. 02/26/22   [provider]  tamsulosin  (FLOMAX ) 0.4 MG CAPS capsule Take 0.4 mg by mouth every evening.    [provider]  torsemide  (DEMADEX ) 20 MG tablet Take 3 tablets (60 mg total) by mouth daily. Take 60 mg daily and take an extra 60mg  if worsening  swelling or weight gain of over 3lbs noted in 24 hours. 09/20/24   Maree, Pratik D, DO  Vitamin D , Ergocalciferol , (DRISDOL) 1.25 MG (50000 UNIT) CAPS capsule Take 50,000 Units by mouth once a week. 06/02/22   [provider]    Allergies: Benazepril, Lisinopril, and Ivp dye [iodinated contrast media]    Review of Systems  Cardiovascular:  Positive for chest pain.  All other systems reviewed and are negative.   Updated Vital Signs BP (!) 153/53 (BP Location: Right Arm)   Pulse 64   Temp 98.3 F (36.8 C) (Oral)   Resp 16   Ht 5' 8 (1.727 m)   Wt 120.3 kg   SpO2 99%   BMI 40.33 kg/m   Physical Exam Vitals and nursing note reviewed.   84 year old male, resting comfortably and in no acute distress. Vital signs are significant for elevated blood pressure. Oxygen  saturation is 99%, which is normal. Head is normocephalic and atraumatic. PERRLA, EOMI.  Lungs are clear without rales, wheezes, or rhonchi. Chest is nontender. Heart has regular rate and rhythm without murmur. Abdomen is soft, flat, nontender. Extremities have no cyanosis or edema, full range of motion is present.  Point tenderness is present in the right elbow posteriorly just lateral to the olecranon. Skin is warm and dry without rash. Neurologic: Mental status is normal, cranial nerves are intact, there are no motor or sensory deficits.  (all labs ordered are listed, but only abnormal results are displayed) Labs Reviewed  BASIC METABOLIC PANEL WITH GFR - Abnormal; Notable for the following components:      Result Value   Glucose, Bld 193 (*)    BUN 38 (*)    Creatinine, Ser 1.72 (*)    GFR, Estimated 39 (*)    All other components within normal limits  CBC WITH DIFFERENTIAL/PLATELET - Abnormal; Notable for the following components:   RBC 3.67 (*)    Hemoglobin 11.0 (*)    HCT 34.7 (*)    All other components within normal limits  TROPONIN T, HIGH SENSITIVITY - Abnormal; Notable for the following  components:   Troponin T High Sensitivity 39 (*)    All other components within normal limits  TROPONIN T, HIGH SENSITIVITY - Abnormal; Notable for the following components:   Troponin T High Sensitivity 40 (*)    All other components within normal limits    EKG: EKG Interpretation Date/Time:  Tuesday November 06 2024 02:31:24 EST Ventricular Rate:  70 PR Interval:  224 QRS Duration:  105 QT Interval:  400 QTC Calculation: 432 R Axis:   -60  Text Interpretation: Sinus rhythm Prolonged PR interval Inferior infarct, old Consider anterior infarct Lateral leads are also involved When compared with ECG of 11/05/2024, No significant change was found Confirmed by Raford Lenis ([id number]) on 11/06/2024 2:34:12 AM  Radiology: [facility] Chest Port 1  View Result Date: 11/05/2024 EXAM: 1 VIEW(S) XRAY OF THE CHEST 11/05/2024 04:20:24 AM COMPARISON: 10/29/2024 CLINICAL HISTORY: chest pain FINDINGS: LUNGS AND PLEURA: Elevated left hemidiaphragm. No focal pulmonary opacity. No pleural effusion. No pneumothorax. HEART AND MEDIASTINUM: No acute abnormality of the cardiac and mediastinal silhouettes. BONES AND SOFT TISSUES: Surgical clips in right neck. Sternotomy wires noted. No acute osseous abnormality. IMPRESSION: 1. No acute cardiopulmonary abnormality. 2. Elevated left hemidiaphragm with postsurgical changes including median sternotomy wires and right neck surgical clips. Electronically signed by: Evalene Coho MD 11/05/2024 04:30 AM EST RP Workstation: [id number]   Cardiac monitor shows normal sinus rhythm, per my interpretation.  Procedures   Medications Ordered in the ED  acetaminophen  (TYLENOL ) tablet 650 mg (650 mg Oral Given 11/06/24 0246)                                    Medical Decision Making Amount and/or Complexity of Data Reviewed Labs: ordered. Radiology: ordered.  Risk OTC drugs.   Chest pain which appears to be angina pectoris completely relieved with nitroglycerin .  Right arm pain  does not appear to be connected to the chest pain, suspect tendinitis.  I have reviewed his past records and do note ED visit yesterday for chest pain relieved with nitroglycerin .  I have ordered electrocardiogram, troponin x 2 and x-ray of the right elbow.  I have ordered a dose of acetaminophen  for pain.  I reviewed his electrocardiogram, and my interpretation is old inferior and anterior infarcts but no acute changes and unchanged from yesterday.  I reviewed his laboratory tests, my interpretation is mild troponin elevation which is stable and flat in the ED, stable renal insufficiency, stable anemia.  I will x-ray shows no acute findings.  I have independently viewed the images, and agree with radiologist's interpretation.  Tonight's episode of chest pain appears to be stable angina pectoralis, still cause of elbow pain is unclear but does not appear to be cardiac related.  I am discharging him with instructions to continue using nitroglycerin  as needed and referring him back to cardiology for follow-up.  Regarding his elbow care, I have advised him to apply ice and use over-the-counter acetaminophen .     Final diagnoses:  Angina pectoris  Pain in right elbow  Elevated troponin  Renal insufficiency  Normochromic normocytic anemia  Anticoagulated on apixaban     ED Discharge Orders     None          Raford Lenis, MD 11/06/24 0505  "

## 2024-11-06 NOTE — ED Triage Notes (Signed)
 Pt BIB [facility] from home c/o mid sternal chest pain at home pt given SL nitroglycerin  by wife. Pt states pain is gone now and only c/o right arm pain.

## 2024-11-06 NOTE — Telephone Encounter (Signed)
 Spoke with wife Rock who states that over the last 3 days the pt has been experiencing chest pain and SOB between 1-2 am. Wife reports that they have been seen in the ER for this pain and was prescribed nitroglycerin . After returning home from the hospital today pt started to have chest pain and at this time took a nitroglycerin . Wife reports that the pt is now sleeping. She is requesting a sooner appt than the 11/15/24 appt. Wife informed that we will place pt on the wait list for sooner appt and that we would notify Dr. Debera. Please advise.

## 2024-11-06 NOTE — Telephone Encounter (Signed)
 Wife is concerned that patient needs to be seen sooner than 1/15 appointment. She is concerned that he might have a heart attack and they have already been to the hospital multiple times.

## 2024-11-07 MED ORDER — ISOSORBIDE DINITRATE 30 MG PO TABS: 30.0000 mg | ORAL_TABLET | Freq: Two times a day (BID) | ORAL | Status: DC

## 2024-11-07 NOTE — Telephone Encounter (Signed)
 Spoke with wife and pt is currently taking isosorbide  dinitrate 30 mg daily.

## 2024-11-07 NOTE — Telephone Encounter (Signed)
 I spoke with wife, they will increase Isordil  to 30 mg twice a day

## 2024-11-08 ENCOUNTER — Ambulatory Visit: Admitting: Physician Assistant

## 2024-11-09 ENCOUNTER — Telehealth: Payer: Self-pay | Admitting: Cardiology

## 2024-11-09 NOTE — Telephone Encounter (Signed)
 Spoke with wife and informed of Dr. Madalyn response to Continue same for now. Wife thankful for the call back.

## 2024-11-09 NOTE — Telephone Encounter (Signed)
 Pt is taking Torsemide  60 mg daily. Current weights are as follows:  1/9 253.6 lb 1/8 254.8 lb 1/7 254.4 lb 1/6 254.4 lb Please advise.

## 2024-11-09 NOTE — Telephone Encounter (Signed)
 Pt c/o of Chest Pain: STAT if active (IN THIS MOMENT) CP, including tightness, pressure, jaw pain, shoulder/upper arm/back pain, SOB, nausea, and vomiting.  1. Are you having CP right now (tightness, pressure, or discomfort)? no  2. Are you experiencing any other symptoms (ex. SOB, nausea, vomiting, sweating)? no  3. How long have you been experiencing CP? Last night   4. Is your CP continuous or coming and going? Coming and going   5. Have you taken Nitroglycerin ? yes  6. If CP returns before callback, please consider calling 911. ?    Wife states pt has knot in chest. Please advise.

## 2024-11-09 NOTE — Telephone Encounter (Signed)
 Spoke with wife and pt who states that pt awoke this morning with chest pain. At that time patient c/o chest pain, SOB and a knot on the chest area that appears when he lays down. The knot disappears when he sits up. Pt did take a nitroglycerin . Pain did go away with one nitroglycerin . Pt denies N/V and sweating. Pt and wife informed to be seen in the ER for chest pain. Wife states that the pt did increase his Isordil  as suggested. Please advise.

## 2024-11-14 ENCOUNTER — Encounter: Payer: Self-pay | Admitting: Cardiology

## 2024-11-14 ENCOUNTER — Other Ambulatory Visit: Payer: Self-pay | Admitting: Cardiology

## 2024-11-14 ENCOUNTER — Other Ambulatory Visit (HOSPITAL_COMMUNITY)
Admission: RE | Admit: 2024-11-14 | Discharge: 2024-11-14 | Disposition: A | Discharge status: Home / Self Care | Source: Ambulatory Visit | Attending: Cardiology | Admitting: Cardiology

## 2024-11-14 ENCOUNTER — Ambulatory Visit: Admitting: Cardiology

## 2024-11-14 VITALS — BP 128/50 | HR 70 | Ht 68.0 in | Wt 254.0 lb

## 2024-11-14 DIAGNOSIS — I200 Unstable angina: Secondary | ICD-10-CM

## 2024-11-14 DIAGNOSIS — I5032 Chronic diastolic (congestive) heart failure: Secondary | ICD-10-CM | POA: Diagnosis not present

## 2024-11-14 DIAGNOSIS — N1832 Chronic kidney disease, stage 3b: Secondary | ICD-10-CM | POA: Insufficient documentation

## 2024-11-14 DIAGNOSIS — I10 Essential (primary) hypertension: Secondary | ICD-10-CM

## 2024-11-14 DIAGNOSIS — I25119 Atherosclerotic heart disease of native coronary artery with unspecified angina pectoris: Secondary | ICD-10-CM | POA: Diagnosis not present

## 2024-11-14 LAB — BASIC METABOLIC PANEL WITH GFR
Anion gap: 12 (ref 5–15)
BUN: 39 mg/dL — ABNORMAL HIGH (ref 8–23)
CO2: 24 mmol/L (ref 22–32)
Calcium: 8.9 mg/dL (ref 8.9–10.3)
Chloride: 100 mmol/L (ref 98–111)
Creatinine, Ser: 1.99 mg/dL — ABNORMAL HIGH (ref 0.61–1.24)
GFR, Estimated: 33 mL/min — ABNORMAL LOW
Glucose, Bld: 246 mg/dL — ABNORMAL HIGH (ref 70–99)
Potassium: 4.4 mmol/L (ref 3.5–5.1)
Sodium: 136 mmol/L (ref 135–145)

## 2024-11-14 LAB — CBC
HCT: 31.9 % — ABNORMAL LOW (ref 39.0–52.0)
Hemoglobin: 10 g/dL — ABNORMAL LOW (ref 13.0–17.0)
MCH: 29.8 pg (ref 26.0–34.0)
MCHC: 31.3 g/dL (ref 30.0–36.0)
MCV: 94.9 fL (ref 80.0–100.0)
Platelets: 211 K/uL (ref 150–400)
RBC: 3.36 MIL/uL — ABNORMAL LOW (ref 4.22–5.81)
RDW: 13.4 % (ref 11.5–15.5)
WBC: 5.2 K/uL (ref 4.0–10.5)
nRBC: 0 % (ref 0.0–0.2)

## 2024-11-14 MED ORDER — DIPHENHYDRAMINE HCL 50 MG PO TABS: ORAL_TABLET | ORAL | 0 refills | Status: AC

## 2024-11-14 MED ORDER — PREDNISONE 50 MG PO TABS: ORAL_TABLET | ORAL | 0 refills | Status: DC

## 2024-11-14 NOTE — Patient Instructions (Addendum)
" °  [facility] A DEPT OF . [facility] [address] [place] [address] Dept: [phone] Loc: [phone]  RUDOLPHO CLAXTON  11/14/2024  You are scheduled for a Cardiac Catheterization on Friday, January 16 with Dr. [name].  1. Please arrive at the [facility] (Main Entrance A) at [facility]: [address] [place], [place] [address] at 9:00 AM (This time is 2 hour(s) before your procedure to ensure your preparation).   Free valet parking service is available. You will check in at ADMITTING. The support person will be asked to wait in the waiting room.  It is OK to have someone drop you off and come back when you are ready to be discharged.    Special note: Every effort is made to have your procedure done on time. Please understand that emergencies sometimes delay scheduled procedures.  2. Diet: Nothing to eat after midnight.   3. Hydration: On January 16, you may drink approved liquids (see below) until 2 hours before the procedure time.       List of approved liquids water, clear juice, clear tea, black coffee, fruit juices, non-citric and without pulp, carbonated beverages, Gatorade, Kool -Aid, plain Jello-O and plain ice popsicles.  4. Labs: You will need to have blood drawn on Wednesday, January 14 at [facility]    5. Medication instructions in preparation for your procedure:   Contrast Allergy: Yes, Please take Prednisone  50mg  by mouth at: Thirteen hours prior to cath 10 pm on Thursday Seven hours prior to cath 4:00am on Friday And prior to leaving home please take last dose of Prednisone  50mg  and Benadryl  50mg  by mouth.    Stop taking Eliquis  (Apixiban) on Wednesday, January 14. HOLD ELIQUIS   STARTING TONIGHT thru FRIDAY  Stop taking, Cozaar  (Losartan ) Friday, January 16,-addendum wife notified to HOLD day before AND day of cath  TAKE 1/2 DOSE OF INSULIN  THE NIGHT BEFORE  (THURSDAY)  NONE ON FRIDAY  HOLD TORSEMIDE  THE MORNING OF CATH- addendum: wife notified to HOLD day before AND day of cath  HOLD OZEMPIC INJECTION OMN FRIDAY 11/16/24  On the morning of your procedure, take your Aspirin  81 mg and any morning medicines NOT listed above.  You may use sips of water.  6. Plan to go home the same day, you will only stay overnight if medically necessary. 7. Bring a current list of your medications and current insurance cards. 8. You MUST have a responsible person to drive you home. 9. Someone MUST be with you the first 24 hours after you arrive home or your discharge will be delayed. 10. Please wear clothes that are easy to get on and off and wear slip-on shoes.  Thank you for allowing us  to care for you!   -- [facility]  "

## 2024-11-14 NOTE — Progress Notes (Signed)
 "    Cardiology Office Note  Date: 11/14/2024   ID: ELAI [name], DOB April 16, 1941, MRN [id number]  History of Present Illness: [name] is an 84 y.o. male last seen in July 2025.  He is here today with his wife for a follow-up visit.  I reviewed interval records including hospitalization in December for evaluation of chest discomfort.  High-sensitivity troponin T levels were not diagnostic of ACS.  Lexiscan  Myoview  showed possible region of apical ischemia with normal LVEF.  Recommendation was for medical therapy at that time.  He has had subsequent ER encounters for evaluation of chest pain and serial high-sensitivity troponin T levels remain nondiagnostic for ACS.  In the interim we had him increase Isordil  to 30 mg twice daily, this has not improved symptoms.  He continues to report nocturnal chest pressure, sometimes radiating to the right arm and improved with sublingual nitroglycerin .  He is fatigued with routine activity including PT.  We went over his medications.  He reports compliance with current regimen.  Current cardiac regimen includes Norvasc  10 mg daily, Lipitor 80 mg daily, Zetia  10 mg daily, Isordil  30 mg twice daily, losartan  25 mg daily, Lopressor  50 mg twice daily, and as needed nitroglycerin .  He is also on Ozempic.  I reviewed his ECG today which shows sinus rhythm with prolonged PR interval and lead motion artifact, leftward axis rule out old inferior infarct pattern, poor R wave progression.  Today we discussed his symptoms.  There is not a great amount of room for further titration of medications, not an optimal candidate for Ranexa given renal insufficiency, could perhaps tolerate an increase in his Isordil , but otherwise blood pressure and heart rate are well-controlled.  We discussed the risks and benefits of a diagnostic cardiac catheterization to assess for any potential revascularization options and he is in agreement to proceed.  He does have a dye allergy which will  need pretreatment and he will require preprocedure hydration as well.  Physical Exam: VS:  BP (!) 128/50 (BP Location: Right Arm, Patient Position: Sitting, Cuff Size: Normal)   Pulse 70   Ht 5' 8 (1.727 m)   Wt 254 lb (115.2 kg)   SpO2 98%   BMI 38.62 kg/m , BMI Body mass index is 38.62 kg/m.  Wt Readings from Last 3 Encounters:  11/14/24 254 lb (115.2 kg)  11/06/24 265 lb 3.4 oz (120.3 kg)  11/05/24 265 lb 3.4 oz (120.3 kg)    General: Patient appears comfortable at rest. HEENT: Conjunctiva and lids normal. Neck: Supple, no elevated JVP or carotid bruits. Lungs: Clear to auscultation, nonlabored breathing at rest. Cardiac: RRR with 1/6 systolic murmur, no gallop. Abdomen: Soft, bowel sounds present. Extremities: Lymphedema is fairly well-controlled.  ECG:  An ECG dated 11/06/2024 was personally reviewed today and demonstrated:  Sinus rhythm with prolonged PR interval, old inferior infarct pattern and decreased R wave progression.  Labwork: 12/20/2023: B Natriuretic Peptide 45.0 02/01/2024: TSH 3.980 10/04/2024: Pro Brain Natriuretic Peptide 133.0 10/29/2024: ALT 40; AST 27 10/30/2024: Magnesium  2.4 11/06/2024: BUN 38; Creatinine, Ser 1.72; Hemoglobin 11.0; Platelets 211; Potassium 4.7; Sodium 139   Other Studies Reviewed Today:  Echocardiogram 09/18/2024:  1. Left ventricular ejection fraction, by estimation, is 55 to 60%. The  left ventricle has normal function. Left ventricular endocardial border  not optimally defined to evaluate regional wall motion. There is mild left  ventricular hypertrophy. Left  ventricular diastolic parameters are consistent with Grade I diastolic  dysfunction (impaired relaxation).  2. Right ventricular systolic function was not well visualized. The right  ventricular size is not well visualized. Tricuspid regurgitation signal is  inadequate for assessing PA pressure.   3. The mitral valve was not well visualized. No evidence of mitral valve   regurgitation. No evidence of mitral stenosis.   4. The aortic valve was not well visualized. There is mild calcification  of the aortic valve. There is mild thickening of the aortic valve. Aortic  valve regurgitation is not visualized. No aortic stenosis is present.   5. Technically difficult study, limited visualization.   Lexiscan  Myoview  10/03/2024:   No ST deviation was noted. Pharmacological protocol is used. Baseline EKG showed normal sinus rhythm, Q waves in inferior leads, non-specific T wave inversions in lateral leads and poor R-wave progression.   LV perfusion is abnormal. There is evidence of ischemia. There is no evidence of infarction. There is a small reversible perfusion defect in the apex with normal wall motion, likely apical thinning artifact versus very minimal ischemia in the apex.   Left ventricular function is normal. Nuclear stress EF: 63%.   Findings are consistent with ischemia (versus artifact) and no infarction. The study is low risk.  Assessment and Plan:  1.  Multivessel CAD status post CABG in 2007.  Recent Lexiscan  Myoview  in December 2025 showed possible region of LV apical ischemia with LVEF 63%, overall low risk.  Despite this he continues to report recurring angina as discussed above, responsive to nitroglycerin .  His present medical regimen is fairly well-rounded with some limitations to further uptitration.  We discussed the risks and benefits of a diagnostic cardiac catheterization (he does have a dye allergy and also CKD stage IIIb increasing risk of contrast nephropathy).  He is in agreement to proceed.  He will need to come off Eliquis  temporarily, will also hold Cozaar  for the procedure, he will need pretreatment for dye allergy and preprocedure hydration.  Continue baseline cardiac regimen including Lipitor 80 mg daily, Zetia  10 mg daily, Lopressor  25 mg twice daily, Isordil  30 mg twice daily, and as needed nitroglycerin .  2.  HFpEF, follow-up  echocardiogram in November 2025 revealed LVEF 55 to 60% with mild LVH and grade 1 diastolic dysfunction, RV not well-visualized.  His weight is down 10 pounds through combination of medications and diet.  He is currently on Ozempic 0.25 mg weekly and Demadex  60 mg daily with extra dose as needed.   3.  Lymphedema, using mechanical compression at home following evaluation in the [place] clinic.  Clinically stable.   4.  Paroxysmal atrial fibrillation/flutter with CHA2DS2-VASc score of 6.  No palpitations reported.  He is on Eliquis  2.5 mg twice daily for stroke prophylaxis.   5.  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  Recent creatinine 1.72 with GFR 39.   6.  Mixed hyperlipidemia.  LDL 74 in November 2023.  Continue Zetia  10 mg daily and Lipitor 40 mg daily.   Disposition:  Follow up after procedure.  Signed, Jayson JUDITHANN Sierras, M.D., F.A.C.C. [facility] "

## 2024-11-14 NOTE — Telephone Encounter (Signed)
 Wife is calling back to say the patient is still having bad chest pain. Pleas advise  Pt c/o of Chest Pain: STAT if active (IN THIS MOMENT) CP, including tightness, pressure, jaw pain, shoulder/upper arm/back pain, SOB, nausea, and vomiting.  1. Are you having CP right now (tightness, pressure, or discomfort)? No (but when does it tightness)  2. Are you experiencing any other symptoms (ex. SOB, nausea, vomiting, sweating)?  Breathing been kinda funny   3. How long have you been experiencing CP? Since last week  4. Is your CP continuous or coming and going? Coming and going  5. Have you taken Nitroglycerin ? no  6. If CP returns before callback, please consider calling 911. ?

## 2024-11-14 NOTE — Telephone Encounter (Signed)
 Apt made today at 1 pm with Dr.McDowell

## 2024-11-14 NOTE — H&P (View-Only) (Signed)
 "    Cardiology Office Note  Date: 11/14/2024   ID: [name], DOB April 16, 1941, MRN [id number]  History of Present Illness: [name] is an 84 y.o. male last seen in July 2025.  He is here today with his wife for a follow-up visit.  I reviewed interval records including hospitalization in December for evaluation of chest discomfort.  High-sensitivity troponin T levels were not diagnostic of ACS.  Lexiscan  Myoview  showed possible region of apical ischemia with normal LVEF.  Recommendation was for medical therapy at that time.  He has had subsequent ER encounters for evaluation of chest pain and serial high-sensitivity troponin T levels remain nondiagnostic for ACS.  In the interim we had him increase Isordil  to 30 mg twice daily, this has not improved symptoms.  He continues to report nocturnal chest pressure, sometimes radiating to the right arm and improved with sublingual nitroglycerin .  He is fatigued with routine activity including PT.  We went over his medications.  He reports compliance with current regimen.  Current cardiac regimen includes Norvasc  10 mg daily, Lipitor 80 mg daily, Zetia  10 mg daily, Isordil  30 mg twice daily, losartan  25 mg daily, Lopressor  50 mg twice daily, and as needed nitroglycerin .  He is also on Ozempic.  I reviewed his ECG today which shows sinus rhythm with prolonged PR interval and lead motion artifact, leftward axis rule out old inferior infarct pattern, poor R wave progression.  Today we discussed his symptoms.  There is not a great amount of room for further titration of medications, not an optimal candidate for Ranexa given renal insufficiency, could perhaps tolerate an increase in his Isordil , but otherwise blood pressure and heart rate are well-controlled.  We discussed the risks and benefits of a diagnostic cardiac catheterization to assess for any potential revascularization options and he is in agreement to proceed.  He does have a dye allergy which will  need pretreatment and he will require preprocedure hydration as well.  Physical Exam: VS:  BP (!) 128/50 (BP Location: Right Arm, Patient Position: Sitting, Cuff Size: Normal)   Pulse 70   Ht 5' 8 (1.727 m)   Wt 254 lb (115.2 kg)   SpO2 98%   BMI 38.62 kg/m , BMI Body mass index is 38.62 kg/m.  Wt Readings from Last 3 Encounters:  11/14/24 254 lb (115.2 kg)  11/06/24 265 lb 3.4 oz (120.3 kg)  11/05/24 265 lb 3.4 oz (120.3 kg)    General: Patient appears comfortable at rest. HEENT: Conjunctiva and lids normal. Neck: Supple, no elevated JVP or carotid bruits. Lungs: Clear to auscultation, nonlabored breathing at rest. Cardiac: RRR with 1/6 systolic murmur, no gallop. Abdomen: Soft, bowel sounds present. Extremities: Lymphedema is fairly well-controlled.  ECG:  An ECG dated 11/06/2024 was personally reviewed today and demonstrated:  Sinus rhythm with prolonged PR interval, old inferior infarct pattern and decreased R wave progression.  Labwork: 12/20/2023: B Natriuretic Peptide 45.0 02/01/2024: TSH 3.980 10/04/2024: Pro Brain Natriuretic Peptide 133.0 10/29/2024: ALT 40; AST 27 10/30/2024: Magnesium  2.4 11/06/2024: BUN 38; Creatinine, Ser 1.72; Hemoglobin 11.0; Platelets 211; Potassium 4.7; Sodium 139   Other Studies Reviewed Today:  Echocardiogram 09/18/2024:  1. Left ventricular ejection fraction, by estimation, is 55 to 60%. The  left ventricle has normal function. Left ventricular endocardial border  not optimally defined to evaluate regional wall motion. There is mild left  ventricular hypertrophy. Left  ventricular diastolic parameters are consistent with Grade I diastolic  dysfunction (impaired relaxation).  2. Right ventricular systolic function was not well visualized. The right  ventricular size is not well visualized. Tricuspid regurgitation signal is  inadequate for assessing PA pressure.   3. The mitral valve was not well visualized. No evidence of mitral valve   regurgitation. No evidence of mitral stenosis.   4. The aortic valve was not well visualized. There is mild calcification  of the aortic valve. There is mild thickening of the aortic valve. Aortic  valve regurgitation is not visualized. No aortic stenosis is present.   5. Technically difficult study, limited visualization.   Lexiscan  Myoview  10/03/2024:   No ST deviation was noted. Pharmacological protocol is used. Baseline EKG showed normal sinus rhythm, Q waves in inferior leads, non-specific T wave inversions in lateral leads and poor R-wave progression.   LV perfusion is abnormal. There is evidence of ischemia. There is no evidence of infarction. There is a small reversible perfusion defect in the apex with normal wall motion, likely apical thinning artifact versus very minimal ischemia in the apex.   Left ventricular function is normal. Nuclear stress EF: 63%.   Findings are consistent with ischemia (versus artifact) and no infarction. The study is low risk.  Assessment and Plan:  1.  Multivessel CAD status post CABG in 2007.  Recent Lexiscan  Myoview  in December 2025 showed possible region of LV apical ischemia with LVEF 63%, overall low risk.  Despite this he continues to report recurring angina as discussed above, responsive to nitroglycerin .  His present medical regimen is fairly well-rounded with some limitations to further uptitration.  We discussed the risks and benefits of a diagnostic cardiac catheterization (he does have a dye allergy and also CKD stage IIIb increasing risk of contrast nephropathy).  He is in agreement to proceed.  He will need to come off Eliquis  temporarily, will also hold Cozaar  for the procedure, he will need pretreatment for dye allergy and preprocedure hydration.  Continue baseline cardiac regimen including Lipitor 80 mg daily, Zetia  10 mg daily, Lopressor  25 mg twice daily, Isordil  30 mg twice daily, and as needed nitroglycerin .  2.  HFpEF, follow-up  echocardiogram in November 2025 revealed LVEF 55 to 60% with mild LVH and grade 1 diastolic dysfunction, RV not well-visualized.  His weight is down 10 pounds through combination of medications and diet.  He is currently on Ozempic 0.25 mg weekly and Demadex  60 mg daily with extra dose as needed.   3.  Lymphedema, using mechanical compression at home following evaluation in the [place] clinic.  Clinically stable.   4.  Paroxysmal atrial fibrillation/flutter with CHA2DS2-VASc score of 6.  No palpitations reported.  He is on Eliquis  2.5 mg twice daily for stroke prophylaxis.   5.  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  Recent creatinine 1.72 with GFR 39.   6.  Mixed hyperlipidemia.  LDL 74 in November 2023.  Continue Zetia  10 mg daily and Lipitor 40 mg daily.   Disposition:  Follow up after procedure.  Signed, [name], M.D., F.A.C.C. [facility] at [facility] "

## 2024-11-15 ENCOUNTER — Telehealth: Payer: Self-pay | Admitting: *Deleted

## 2024-11-15 ENCOUNTER — Ambulatory Visit: Admitting: Cardiology

## 2024-11-15 ENCOUNTER — Telehealth: Payer: Self-pay | Admitting: Cardiology

## 2024-11-15 NOTE — Telephone Encounter (Signed)
 I spoke with wife and she reports [name] had to use NTG twice last night for chest pain. He is currently resting without pain. We talked going to the ED if he needs to take more than 3 NTG given five minutes a apart.  He will arrive at 6 am tomorrow in preparation for cath at [facility]  I will notify Dr.McDowell.

## 2024-11-15 NOTE — Telephone Encounter (Signed)
" °  Pt c/o of Chest Pain: STAT if active CP, including tightness, pressure, jaw pain, radiating pain to shoulder/upper arm/back, CP unrelieved by Nitro. Symptoms reported of SOB, nausea, vomiting, sweating.  1. Are you having CP right now?   No  2. Are you experiencing any other symptoms (ex. SOB, nausea, vomiting, sweating)?   No  3. Is your CP continuous or coming and going?   Continuous during episodes  4. Have you taken Nitroglycerin ?   Yes - twice  5. How long have you been experiencing CP?   Wife stated 11:00 pm last night and 2nd time was about 1 hour later  6. If NO CP at time of call then end call with telling Pt to call back or call 911 if Chest pain returns prior to return call from triage team.   Wife Joya) stated patient had chest pains last night but is resting now.  Wife is concerned as patient has a procedure scheduled for tomorrow. "

## 2024-11-15 NOTE — Telephone Encounter (Signed)
 Cardiac Catheterization scheduled at [facility] for:  Friday November 16, 2024 11 AM Arrival time [facility] Main Entrance A at: 6 AM-pre-procedure hydration  Diet: -Nothing to eat after midnight.  Hydration: -May drink clear liquids until 2 hours before the procedure.  Approved liquids: Water, clear tea, black coffee, fruit juices-non-citric and without pulp,Gatorade, plain Jello/popsicles.   No PO hydration (bottle of water) -going in early for IVF  CONTRAST ALLERGY: 13 hour Prednisone  and Benadryl  Prep: 11/15/24 Prednisone  50 mg 10 PM 11/16/24 Prednisone  50 mg 4 AM 11/16/24 Prednisone  50 mg-pt will take medication to hospital and take at hospital at 9 AM I have asked pt to let Short Stay staff know he has medication with him.  Medication instructions: -Hold:  Torsemide /Losartan  -day before and day of procedure- per protocol eGFR < 60 (33)  Insulin -AM of procedure/1/2 usual dose HS prior to procedure  Eliquis -pt tells me last dose was 11/14/24 ~ 5 PM -knows to hold until post procedure  (Pt was instructed 11/14/24 to hold starting PM dose 11/14/24.) Ozempic-weekly on Fridays-knows to hold AM of procedure -Other usual morning medications can be taken including aspirin  81 mg.  Plan to go home the same day, you will only stay overnight if medically necessary.  You must have responsible adult to drive you home.  Someone must be with you the first 24 hours after you arrive home.  Reviewed procedure instructions/pre-procedure hydration with patient's wife (DPR), Rock.  See other Phone Note today-[name] aware per Dr Debera Continue with same plan for cardiac catheterization tomorrow. Certainly, if his symptoms escalate including increasing nitroglycerin  use in the interim, he should be seen in the ER

## 2024-11-16 ENCOUNTER — Other Ambulatory Visit (HOSPITAL_COMMUNITY): Payer: Self-pay

## 2024-11-16 ENCOUNTER — Ambulatory Visit (HOSPITAL_COMMUNITY)
Admission: RE | Admit: 2024-11-16 | Discharge: 2024-11-16 | Disposition: A | Discharge status: Home / Self Care | Attending: Cardiovascular Disease | Admitting: Cardiovascular Disease

## 2024-11-16 ENCOUNTER — Encounter (HOSPITAL_COMMUNITY): Admission: RE | Disposition: A | Payer: Self-pay | Source: Home / Self Care | Attending: Cardiovascular Disease

## 2024-11-16 ENCOUNTER — Other Ambulatory Visit: Payer: Self-pay

## 2024-11-16 DIAGNOSIS — I2584 Coronary atherosclerosis due to calcified coronary lesion: Secondary | ICD-10-CM | POA: Insufficient documentation

## 2024-11-16 DIAGNOSIS — I5032 Chronic diastolic (congestive) heart failure: Secondary | ICD-10-CM | POA: Insufficient documentation

## 2024-11-16 DIAGNOSIS — I4892 Unspecified atrial flutter: Secondary | ICD-10-CM | POA: Diagnosis not present

## 2024-11-16 DIAGNOSIS — I25119 Atherosclerotic heart disease of native coronary artery with unspecified angina pectoris: Secondary | ICD-10-CM | POA: Diagnosis not present

## 2024-11-16 DIAGNOSIS — Z7901 Long term (current) use of anticoagulants: Secondary | ICD-10-CM | POA: Insufficient documentation

## 2024-11-16 DIAGNOSIS — I200 Unstable angina: Secondary | ICD-10-CM

## 2024-11-16 DIAGNOSIS — I2582 Chronic total occlusion of coronary artery: Secondary | ICD-10-CM | POA: Insufficient documentation

## 2024-11-16 DIAGNOSIS — E782 Mixed hyperlipidemia: Secondary | ICD-10-CM | POA: Insufficient documentation

## 2024-11-16 DIAGNOSIS — N1832 Chronic kidney disease, stage 3b: Secondary | ICD-10-CM | POA: Insufficient documentation

## 2024-11-16 DIAGNOSIS — Z951 Presence of aortocoronary bypass graft: Secondary | ICD-10-CM | POA: Diagnosis not present

## 2024-11-16 DIAGNOSIS — Z79899 Other long term (current) drug therapy: Secondary | ICD-10-CM | POA: Insufficient documentation

## 2024-11-16 DIAGNOSIS — Z91041 Radiographic dye allergy status: Secondary | ICD-10-CM | POA: Diagnosis not present

## 2024-11-16 DIAGNOSIS — I480 Paroxysmal atrial fibrillation: Secondary | ICD-10-CM | POA: Diagnosis not present

## 2024-11-16 HISTORY — PX: LEFT HEART CATH AND CORS/GRAFTS ANGIOGRAPHY: CATH118250

## 2024-11-16 LAB — GLUCOSE, CAPILLARY
Glucose-Capillary: 217 mg/dL — ABNORMAL HIGH (ref 70–99)
Glucose-Capillary: 240 mg/dL — ABNORMAL HIGH (ref 70–99)

## 2024-11-16 MED ORDER — FENTANYL CITRATE (PF) 100 MCG/2ML IJ SOLN
INTRAMUSCULAR | Status: AC
  Filled 2024-11-16: qty 2

## 2024-11-16 MED ORDER — NITROGLYCERIN 0.4 MG SL SUBL: 0.4000 mg | SUBLINGUAL_TABLET | SUBLINGUAL | Status: DC | PRN

## 2024-11-16 MED ORDER — NITROGLYCERIN 0.4 MG SL SUBL
SUBLINGUAL_TABLET | SUBLINGUAL | Status: AC
  Administered 2024-11-16: 0.4 mg
  Filled 2024-11-16: qty 1

## 2024-11-16 MED ORDER — LABETALOL HCL 5 MG/ML IV SOLN: 10.0000 mg | INTRAVENOUS | Status: DC | PRN

## 2024-11-16 MED ORDER — LIDOCAINE HCL (PF) 1 % IJ SOLN
INTRAMUSCULAR | Status: DC | PRN
  Administered 2024-11-16: 15 mL

## 2024-11-16 MED ORDER — LIDOCAINE HCL (PF) 1 % IJ SOLN
INTRAMUSCULAR | Status: AC
  Filled 2024-11-16: qty 30

## 2024-11-16 MED ORDER — ASPIRIN 81 MG PO CHEW: 81.0000 mg | CHEWABLE_TABLET | ORAL | Status: DC

## 2024-11-16 MED ORDER — ACETAMINOPHEN 325 MG PO TABS: 650.0000 mg | ORAL_TABLET | ORAL | Status: DC | PRN

## 2024-11-16 MED ORDER — ISOSORBIDE MONONITRATE ER 30 MG PO TB24
60.0000 mg | ORAL_TABLET | Freq: Every day | ORAL | 6 refills | Status: AC
  Filled 2024-11-16: qty 60, 30d supply, fill #0

## 2024-11-16 MED ORDER — SODIUM CHLORIDE 0.9 % WEIGHT BASED INFUSION: 1.0000 mL/kg/h | INTRAVENOUS | Status: DC

## 2024-11-16 MED ORDER — ONDANSETRON HCL 4 MG/2ML IJ SOLN: 4.0000 mg | Freq: Four times a day (QID) | INTRAMUSCULAR | Status: DC | PRN

## 2024-11-16 MED ORDER — SODIUM CHLORIDE 0.9% FLUSH: 3.0000 mL | INTRAVENOUS | Status: DC | PRN

## 2024-11-16 MED ORDER — SODIUM CHLORIDE 0.9 % IV SOLN: 250.0000 mL | INTRAVENOUS | Status: DC | PRN

## 2024-11-16 MED ORDER — HYDRALAZINE HCL 20 MG/ML IJ SOLN: 10.0000 mg | INTRAMUSCULAR | Status: DC | PRN

## 2024-11-16 MED ORDER — MIDAZOLAM HCL 2 MG/2ML IJ SOLN
INTRAMUSCULAR | Status: AC
  Filled 2024-11-16: qty 2

## 2024-11-16 MED ORDER — HEPARIN (PORCINE) IN NACL 1000-0.9 UT/500ML-% IV SOLN
INTRAVENOUS | Status: DC | PRN
  Administered 2024-11-16 (×2): 500 mL

## 2024-11-16 MED ORDER — IOHEXOL 350 MG/ML SOLN
INTRAVENOUS | Status: DC | PRN
  Administered 2024-11-16: 50 mL

## 2024-11-16 MED ORDER — MIDAZOLAM HCL (PF) 2 MG/2ML IJ SOLN
INTRAMUSCULAR | Status: DC | PRN
  Administered 2024-11-16: 1 mg via INTRAVENOUS

## 2024-11-16 MED ORDER — SODIUM CHLORIDE 0.9 % WEIGHT BASED INFUSION
3.0000 mL/kg/h | INTRAVENOUS | Status: AC
  Administered 2024-11-16: 3 mL/kg/h via INTRAVENOUS

## 2024-11-16 MED ORDER — FENTANYL CITRATE (PF) 100 MCG/2ML IJ SOLN
INTRAMUSCULAR | Status: DC | PRN
  Administered 2024-11-16: 25 ug via INTRAVENOUS

## 2024-11-16 MED ORDER — CLOPIDOGREL BISULFATE 75 MG PO TABS
75.0000 mg | ORAL_TABLET | Freq: Every day | ORAL | 6 refills | Status: AC
  Filled 2024-11-16: qty 30, 30d supply, fill #0

## 2024-11-16 MED ORDER — SODIUM CHLORIDE 0.9% FLUSH: 3.0000 mL | Freq: Two times a day (BID) | INTRAVENOUS | Status: DC

## 2024-11-16 NOTE — Interval H&P Note (Signed)
 History and Physical Interval Note:  11/16/2024 12:03 PM  [name]  has presented today for surgery, with the diagnosis of angina.  The various methods of treatment have been discussed with the patient and family. After consideration of risks, benefits and other options for treatment, the patient has consented to  Procedures: LEFT HEART CATH AND CORS/GRAFTS ANGIOGRAPHY (N/A) as a surgical intervention.  The patient's history has been reviewed, patient examined, no change in status, stable for surgery.  I have reviewed the patient's chart and labs.  Questions were answered to the patient's satisfaction.     [name]

## 2024-11-16 NOTE — Progress Notes (Signed)
 1450- Patient ambulated with RN, tolerated well but mild oozing noted at site, pressure held for 15 min, patient placed in supine position and placed on bedrest. While on bedrest at approximately   1520-patient began to report 5-6/10 chest pain, vitals obtained please see chart, EKG obtained, MD Wonda called as well, asked to give 0.4 SL Nitroglycerin  x1, patient reports 0/10 chest pain about 10 min after nitroglycerin  was administered, MD cooper to report to beside after procedure in the mean time APP was messaged and made aware of situation. MD Wonda to bedside to evaluate patient, patient is to d/c home still but changes to medications will be made, new AVS printed and TLC brought medications to room. Education provided to patient and patient wife, verified with APP and MD when to start Plavix  education provided to family and patient. Right groin site is soft, clean, dry and intact upon discharge.

## 2024-11-17 ENCOUNTER — Encounter (HOSPITAL_COMMUNITY): Payer: Self-pay | Admitting: Emergency Medicine

## 2024-11-17 ENCOUNTER — Emergency Department (HOSPITAL_COMMUNITY)

## 2024-11-17 ENCOUNTER — Inpatient Hospital Stay (HOSPITAL_COMMUNITY)
Admission: EM | Admit: 2024-11-17 | Discharge: 2024-11-20 | DRG: 322 | Disposition: A | Discharge status: Home / Self Care | Attending: Family Medicine | Admitting: Family Medicine

## 2024-11-17 DIAGNOSIS — K219 Gastro-esophageal reflux disease without esophagitis: Secondary | ICD-10-CM | POA: Diagnosis present

## 2024-11-17 DIAGNOSIS — D631 Anemia in chronic kidney disease: Secondary | ICD-10-CM | POA: Diagnosis present

## 2024-11-17 DIAGNOSIS — Z955 Presence of coronary angioplasty implant and graft: Secondary | ICD-10-CM

## 2024-11-17 DIAGNOSIS — D649 Anemia, unspecified: Secondary | ICD-10-CM

## 2024-11-17 DIAGNOSIS — Z7902 Long term (current) use of antithrombotics/antiplatelets: Secondary | ICD-10-CM

## 2024-11-17 DIAGNOSIS — I25810 Atherosclerosis of coronary artery bypass graft(s) without angina pectoris: Secondary | ICD-10-CM | POA: Diagnosis present

## 2024-11-17 DIAGNOSIS — Z833 Family history of diabetes mellitus: Secondary | ICD-10-CM

## 2024-11-17 DIAGNOSIS — E785 Hyperlipidemia, unspecified: Secondary | ICD-10-CM | POA: Diagnosis present

## 2024-11-17 DIAGNOSIS — E66812 Obesity, class 2: Secondary | ICD-10-CM | POA: Diagnosis present

## 2024-11-17 DIAGNOSIS — E1165 Type 2 diabetes mellitus with hyperglycemia: Secondary | ICD-10-CM | POA: Diagnosis present

## 2024-11-17 DIAGNOSIS — I130 Hypertensive heart and chronic kidney disease with heart failure and stage 1 through stage 4 chronic kidney disease, or unspecified chronic kidney disease: Secondary | ICD-10-CM | POA: Diagnosis present

## 2024-11-17 DIAGNOSIS — N189 Chronic kidney disease, unspecified: Secondary | ICD-10-CM

## 2024-11-17 DIAGNOSIS — E1151 Type 2 diabetes mellitus with diabetic peripheral angiopathy without gangrene: Secondary | ICD-10-CM | POA: Diagnosis present

## 2024-11-17 DIAGNOSIS — Z91041 Radiographic dye allergy status: Secondary | ICD-10-CM

## 2024-11-17 DIAGNOSIS — R001 Bradycardia, unspecified: Secondary | ICD-10-CM | POA: Diagnosis present

## 2024-11-17 DIAGNOSIS — I214 Non-ST elevation (NSTEMI) myocardial infarction: Principal | ICD-10-CM | POA: Diagnosis present

## 2024-11-17 DIAGNOSIS — I2510 Atherosclerotic heart disease of native coronary artery without angina pectoris: Secondary | ICD-10-CM | POA: Diagnosis present

## 2024-11-17 DIAGNOSIS — E872 Acidosis, unspecified: Secondary | ICD-10-CM | POA: Diagnosis present

## 2024-11-17 DIAGNOSIS — N179 Acute kidney failure, unspecified: Secondary | ICD-10-CM

## 2024-11-17 DIAGNOSIS — I200 Unstable angina: Secondary | ICD-10-CM | POA: Diagnosis not present

## 2024-11-17 DIAGNOSIS — E1122 Type 2 diabetes mellitus with diabetic chronic kidney disease: Secondary | ICD-10-CM | POA: Diagnosis present

## 2024-11-17 DIAGNOSIS — I440 Atrioventricular block, first degree: Secondary | ICD-10-CM | POA: Diagnosis present

## 2024-11-17 DIAGNOSIS — Z87891 Personal history of nicotine dependence: Secondary | ICD-10-CM

## 2024-11-17 DIAGNOSIS — Z888 Allergy status to other drugs, medicaments and biological substances status: Secondary | ICD-10-CM

## 2024-11-17 DIAGNOSIS — Z8249 Family history of ischemic heart disease and other diseases of the circulatory system: Secondary | ICD-10-CM

## 2024-11-17 DIAGNOSIS — Z7982 Long term (current) use of aspirin: Secondary | ICD-10-CM

## 2024-11-17 DIAGNOSIS — Z6838 Body mass index (BMI) 38.0-38.9, adult: Secondary | ICD-10-CM

## 2024-11-17 DIAGNOSIS — Z7901 Long term (current) use of anticoagulants: Secondary | ICD-10-CM

## 2024-11-17 DIAGNOSIS — Z79899 Other long term (current) drug therapy: Secondary | ICD-10-CM

## 2024-11-17 DIAGNOSIS — Z951 Presence of aortocoronary bypass graft: Secondary | ICD-10-CM

## 2024-11-17 DIAGNOSIS — Z79624 Long term (current) use of inhibitors of nucleotide synthesis: Secondary | ICD-10-CM

## 2024-11-17 DIAGNOSIS — Z7985 Long-term (current) use of injectable non-insulin antidiabetic drugs: Secondary | ICD-10-CM

## 2024-11-17 DIAGNOSIS — R739 Hyperglycemia, unspecified: Secondary | ICD-10-CM

## 2024-11-17 DIAGNOSIS — I480 Paroxysmal atrial fibrillation: Secondary | ICD-10-CM | POA: Diagnosis present

## 2024-11-17 DIAGNOSIS — I252 Old myocardial infarction: Secondary | ICD-10-CM

## 2024-11-17 DIAGNOSIS — F32A Depression, unspecified: Secondary | ICD-10-CM | POA: Diagnosis present

## 2024-11-17 DIAGNOSIS — Z83438 Family history of other disorder of lipoprotein metabolism and other lipidemia: Secondary | ICD-10-CM

## 2024-11-17 DIAGNOSIS — Z8673 Personal history of transient ischemic attack (TIA), and cerebral infarction without residual deficits: Secondary | ICD-10-CM

## 2024-11-17 DIAGNOSIS — N1831 Chronic kidney disease, stage 3a: Secondary | ICD-10-CM | POA: Diagnosis present

## 2024-11-17 DIAGNOSIS — E871 Hypo-osmolality and hyponatremia: Secondary | ICD-10-CM

## 2024-11-17 LAB — BASIC METABOLIC PANEL WITH GFR
Anion gap: 16 — ABNORMAL HIGH (ref 5–15)
Anion gap: 17 — ABNORMAL HIGH (ref 5–15)
Anion gap: 13 (ref 5–15)
BUN: 52 mg/dL — ABNORMAL HIGH (ref 8–23)
BUN: 57 mg/dL — ABNORMAL HIGH (ref 8–23)
BUN: 54 mg/dL — ABNORMAL HIGH (ref 8–23)
CO2: 21 mmol/L — ABNORMAL LOW (ref 22–32)
CO2: 18 mmol/L — ABNORMAL LOW (ref 22–32)
CO2: 23 mmol/L (ref 22–32)
Calcium: 8.4 mg/dL — ABNORMAL LOW (ref 8.9–10.3)
Calcium: 8.4 mg/dL — ABNORMAL LOW (ref 8.9–10.3)
Calcium: 8.3 mg/dL — ABNORMAL LOW (ref 8.9–10.3)
Chloride: 96 mmol/L — ABNORMAL LOW (ref 98–111)
Chloride: 102 mmol/L (ref 98–111)
Chloride: 102 mmol/L (ref 98–111)
Creatinine, Ser: 2.41 mg/dL — ABNORMAL HIGH (ref 0.61–1.24)
Creatinine, Ser: 2.41 mg/dL — ABNORMAL HIGH (ref 0.61–1.24)
Creatinine, Ser: 2.05 mg/dL — ABNORMAL HIGH (ref 0.61–1.24)
GFR, Estimated: 26 mL/min — ABNORMAL LOW
GFR, Estimated: 26 mL/min — ABNORMAL LOW
GFR, Estimated: 32 mL/min — ABNORMAL LOW
Glucose, Bld: 515 mg/dL (ref 70–99)
Glucose, Bld: 290 mg/dL — ABNORMAL HIGH (ref 70–99)
Glucose, Bld: 134 mg/dL — ABNORMAL HIGH (ref 70–99)
Potassium: 4.9 mmol/L (ref 3.5–5.1)
Potassium: 4.4 mmol/L (ref 3.5–5.1)
Potassium: 4.4 mmol/L (ref 3.5–5.1)
Sodium: 133 mmol/L — ABNORMAL LOW (ref 135–145)
Sodium: 136 mmol/L (ref 135–145)
Sodium: 138 mmol/L (ref 135–145)

## 2024-11-17 LAB — BLOOD GAS, VENOUS
Acid-base deficit: 3.9 mmol/L — ABNORMAL HIGH (ref 0.0–2.0)
Bicarbonate: 21.6 mmol/L (ref 20.0–28.0)
Drawn by: 1517
O2 Saturation: 60.3 %
Patient temperature: 36.8
pCO2, Ven: 40 mmHg — ABNORMAL LOW (ref 44–60)
pH, Ven: 7.34 (ref 7.25–7.43)
pO2, Ven: 32 mmHg (ref 32–45)

## 2024-11-17 LAB — CBC WITH DIFFERENTIAL/PLATELET
Abs Immature Granulocytes: 0.07 K/uL (ref 0.00–0.07)
Basophils Absolute: 0 K/uL (ref 0.0–0.1)
Basophils Relative: 0 %
Eosinophils Absolute: 0 K/uL (ref 0.0–0.5)
Eosinophils Relative: 0 %
HCT: 32.4 % — ABNORMAL LOW (ref 39.0–52.0)
Hemoglobin: 10.4 g/dL — ABNORMAL LOW (ref 13.0–17.0)
Immature Granulocytes: 1 %
Lymphocytes Relative: 15 %
Lymphs Abs: 2.1 K/uL (ref 0.7–4.0)
MCH: 30.6 pg (ref 26.0–34.0)
MCHC: 32.1 g/dL (ref 30.0–36.0)
MCV: 95.3 fL (ref 80.0–100.0)
Monocytes Absolute: 1.3 K/uL — ABNORMAL HIGH (ref 0.1–1.0)
Monocytes Relative: 10 %
Neutro Abs: 10.2 K/uL — ABNORMAL HIGH (ref 1.7–7.7)
Neutrophils Relative %: 74 %
Platelets: 248 K/uL (ref 150–400)
RBC: 3.4 MIL/uL — ABNORMAL LOW (ref 4.22–5.81)
RDW: 13.3 % (ref 11.5–15.5)
WBC: 13.7 K/uL — ABNORMAL HIGH (ref 4.0–10.5)
nRBC: 0 % (ref 0.0–0.2)

## 2024-11-17 LAB — TROPONIN T, HIGH SENSITIVITY
Troponin T High Sensitivity: 76 ng/L — ABNORMAL HIGH (ref 0–19)
Troponin T High Sensitivity: 101 ng/L (ref 0–19)
Troponin T High Sensitivity: 127 ng/L (ref 0–19)
Troponin T High Sensitivity: 146 ng/L (ref 0–19)

## 2024-11-17 LAB — CBC
HCT: 30.3 % — ABNORMAL LOW (ref 39.0–52.0)
Hemoglobin: 9.7 g/dL — ABNORMAL LOW (ref 13.0–17.0)
MCH: 30.2 pg (ref 26.0–34.0)
MCHC: 32 g/dL (ref 30.0–36.0)
MCV: 94.4 fL (ref 80.0–100.0)
Platelets: 236 K/uL (ref 150–400)
RBC: 3.21 MIL/uL — ABNORMAL LOW (ref 4.22–5.81)
RDW: 13.2 % (ref 11.5–15.5)
WBC: 9.1 K/uL (ref 4.0–10.5)
nRBC: 0 % (ref 0.0–0.2)

## 2024-11-17 LAB — BETA-HYDROXYBUTYRIC ACID: Beta-Hydroxybutyric Acid: 0.08 mmol/L (ref 0.05–0.27)

## 2024-11-17 LAB — COMPREHENSIVE METABOLIC PANEL WITH GFR
ALT: 26 U/L (ref 0–44)
AST: 27 U/L (ref 15–41)
Albumin: 3.9 g/dL (ref 3.5–5.0)
Alkaline Phosphatase: 66 U/L (ref 38–126)
Anion gap: 18 — ABNORMAL HIGH (ref 5–15)
BUN: 53 mg/dL — ABNORMAL HIGH (ref 8–23)
CO2: 21 mmol/L — ABNORMAL LOW (ref 22–32)
Calcium: 8.7 mg/dL — ABNORMAL LOW (ref 8.9–10.3)
Chloride: 101 mmol/L (ref 98–111)
Creatinine, Ser: 2.37 mg/dL — ABNORMAL HIGH (ref 0.61–1.24)
GFR, Estimated: 27 mL/min — ABNORMAL LOW
Glucose, Bld: 116 mg/dL — ABNORMAL HIGH (ref 70–99)
Potassium: 4.1 mmol/L (ref 3.5–5.1)
Sodium: 139 mmol/L (ref 135–145)
Total Bilirubin: 0.3 mg/dL (ref 0.0–1.2)
Total Protein: 7.5 g/dL (ref 6.5–8.1)

## 2024-11-17 LAB — CBG MONITORING, ED
Glucose-Capillary: 278 mg/dL — ABNORMAL HIGH (ref 70–99)
Glucose-Capillary: 228 mg/dL — ABNORMAL HIGH (ref 70–99)
Glucose-Capillary: 115 mg/dL — ABNORMAL HIGH (ref 70–99)
Glucose-Capillary: 91 mg/dL (ref 70–99)
Glucose-Capillary: 103 mg/dL — ABNORMAL HIGH (ref 70–99)
Glucose-Capillary: 129 mg/dL — ABNORMAL HIGH (ref 70–99)
Glucose-Capillary: 158 mg/dL — ABNORMAL HIGH (ref 70–99)
Glucose-Capillary: 214 mg/dL — ABNORMAL HIGH (ref 70–99)
Glucose-Capillary: 229 mg/dL — ABNORMAL HIGH (ref 70–99)

## 2024-11-17 LAB — MAGNESIUM: Magnesium: 2.6 mg/dL — ABNORMAL HIGH (ref 1.7–2.4)

## 2024-11-17 LAB — APTT: aPTT: 135 s — ABNORMAL HIGH (ref 24–36)

## 2024-11-17 LAB — HEPARIN LEVEL (UNFRACTIONATED)
Heparin Unfractionated: 0.77 [IU]/mL — ABNORMAL HIGH (ref 0.30–0.70)
Heparin Unfractionated: 0.7 [IU]/mL (ref 0.30–0.70)

## 2024-11-17 MED ORDER — GLUCOSE-VITAMIN C 4-6 GM-MG PO CHEW: 4.0000 | CHEWABLE_TABLET | ORAL | Status: DC

## 2024-11-17 MED ORDER — DEXTROSE IN LACTATED RINGERS 5 % IV SOLN: INTRAVENOUS | Status: DC

## 2024-11-17 MED ORDER — NITROGLYCERIN IN D5W 200-5 MCG/ML-% IV SOLN: 0.0000 ug/min | INTRAVENOUS | Status: DC

## 2024-11-17 MED ORDER — ISOSORBIDE MONONITRATE ER 60 MG PO TB24
60.0000 mg | ORAL_TABLET | Freq: Every day | ORAL | Status: DC
  Administered 2024-11-17 – 2024-11-20 (×4): 60 mg via ORAL
  Filled 2024-11-17 (×4): qty 1

## 2024-11-17 MED ORDER — ALBUTEROL SULFATE HFA 108 (90 BASE) MCG/ACT IN AERS: 1.0000 | INHALATION_SPRAY | Freq: Four times a day (QID) | RESPIRATORY_TRACT | Status: DC | PRN

## 2024-11-17 MED ORDER — NITROGLYCERIN 2 % TD OINT
1.0000 [in_us] | TOPICAL_OINTMENT | Freq: Once | TRANSDERMAL | Status: AC
  Administered 2024-11-17: 1 [in_us] via TOPICAL
  Filled 2024-11-17: qty 1

## 2024-11-17 MED ORDER — ALBUTEROL SULFATE (2.5 MG/3ML) 0.083% IN NEBU: 2.5000 mg | INHALATION_SOLUTION | Freq: Four times a day (QID) | RESPIRATORY_TRACT | Status: DC | PRN

## 2024-11-17 MED ORDER — AMLODIPINE BESYLATE 5 MG PO TABS
10.0000 mg | ORAL_TABLET | Freq: Every day | ORAL | Status: DC
  Administered 2024-11-17 – 2024-11-20 (×4): 10 mg via ORAL
  Filled 2024-11-17: qty 1
  Filled 2024-11-17: qty 2
  Filled 2024-11-17: qty 1
  Filled 2024-11-17: qty 2

## 2024-11-17 MED ORDER — ATORVASTATIN CALCIUM 80 MG PO TABS
80.0000 mg | ORAL_TABLET | Freq: Every day | ORAL | Status: DC
  Administered 2024-11-17 – 2024-11-20 (×4): 80 mg via ORAL
  Filled 2024-11-17: qty 1
  Filled 2024-11-17 (×2): qty 2
  Filled 2024-11-17: qty 1

## 2024-11-17 MED ORDER — INSULIN REGULAR(HUMAN) IN NACL 100-0.9 UT/100ML-% IV SOLN
INTRAVENOUS | Status: DC
  Administered 2024-11-17: 10 [IU]/h via INTRAVENOUS
  Filled 2024-11-17: qty 100

## 2024-11-17 MED ORDER — ALUM & MAG HYDROXIDE-SIMETH 200-200-20 MG/5ML PO SUSP: 15.0000 mL | ORAL | Status: DC | PRN

## 2024-11-17 MED ORDER — COLCHICINE 0.6 MG PO TABS: 1.2000 mg | ORAL_TABLET | Freq: Two times a day (BID) | ORAL | Status: DC

## 2024-11-17 MED ORDER — EZETIMIBE 10 MG PO TABS
10.0000 mg | ORAL_TABLET | Freq: Every day | ORAL | Status: DC
  Administered 2024-11-17 – 2024-11-20 (×4): 10 mg via ORAL
  Filled 2024-11-17 (×4): qty 1

## 2024-11-17 MED ORDER — MORPHINE SULFATE (PF) 4 MG/ML IV SOLN
4.0000 mg | Freq: Once | INTRAVENOUS | Status: AC
  Administered 2024-11-17: 4 mg via INTRAVENOUS
  Filled 2024-11-17: qty 1

## 2024-11-17 MED ORDER — GABAPENTIN 300 MG PO CAPS
300.0000 mg | ORAL_CAPSULE | Freq: Two times a day (BID) | ORAL | Status: DC
  Administered 2024-11-17 – 2024-11-20 (×7): 300 mg via ORAL
  Filled 2024-11-17 (×7): qty 1

## 2024-11-17 MED ORDER — TAMSULOSIN HCL 0.4 MG PO CAPS
0.4000 mg | ORAL_CAPSULE | Freq: Every evening | ORAL | Status: DC
  Administered 2024-11-17 – 2024-11-18 (×2): 0.4 mg via ORAL
  Filled 2024-11-17 (×2): qty 1

## 2024-11-17 MED ORDER — INSULIN GLARGINE-YFGN 100 UNIT/ML ~~LOC~~ SOLN
10.0000 [IU] | Freq: Two times a day (BID) | SUBCUTANEOUS | Status: DC
  Administered 2024-11-17 – 2024-11-20 (×7): 10 [IU] via SUBCUTANEOUS
  Filled 2024-11-17 (×11): qty 0.1

## 2024-11-17 MED ORDER — LACTATED RINGERS IV SOLN: INTRAVENOUS | Status: DC

## 2024-11-17 MED ORDER — ASPIRIN 81 MG PO CHEW
324.0000 mg | CHEWABLE_TABLET | Freq: Once | ORAL | Status: AC
  Administered 2024-11-17: 324 mg via ORAL
  Filled 2024-11-17: qty 4

## 2024-11-17 MED ORDER — NITROGLYCERIN 0.4 MG SL SUBL: 0.4000 mg | SUBLINGUAL_TABLET | SUBLINGUAL | Status: DC | PRN

## 2024-11-17 MED ORDER — SODIUM CHLORIDE 0.9 % IV BOLUS
500.0000 mL | Freq: Once | INTRAVENOUS | Status: AC
  Administered 2024-11-17: 500 mL via INTRAVENOUS

## 2024-11-17 MED ORDER — HEPARIN (PORCINE) 25000 UT/250ML-% IV SOLN
950.0000 [IU]/h | INTRAVENOUS | Status: DC
  Administered 2024-11-17: 950 [IU]/h via INTRAVENOUS
  Administered 2024-11-17: 1100 [IU]/h via INTRAVENOUS
  Administered 2024-11-19: 950 [IU]/h via INTRAVENOUS
  Filled 2024-11-17 (×3): qty 250

## 2024-11-17 MED ORDER — METHOCARBAMOL 500 MG PO TABS
500.0000 mg | ORAL_TABLET | Freq: Three times a day (TID) | ORAL | Status: DC | PRN
  Administered 2024-11-17 (×2): 500 mg via ORAL
  Filled 2024-11-17 (×2): qty 1

## 2024-11-17 MED ORDER — CLOPIDOGREL BISULFATE 75 MG PO TABS
75.0000 mg | ORAL_TABLET | Freq: Every day | ORAL | Status: DC
  Administered 2024-11-17 – 2024-11-20 (×4): 75 mg via ORAL
  Filled 2024-11-17 (×4): qty 1

## 2024-11-17 MED ORDER — INSULIN ASPART 100 UNIT/ML IJ SOLN
0.0000 [IU] | Freq: Three times a day (TID) | INTRAMUSCULAR | Status: DC
  Administered 2024-11-17: 3 [IU] via SUBCUTANEOUS
  Administered 2024-11-17 – 2024-11-18 (×2): 2 [IU] via SUBCUTANEOUS
  Administered 2024-11-18: 3 [IU] via SUBCUTANEOUS
  Administered 2024-11-19: 2 [IU] via SUBCUTANEOUS
  Administered 2024-11-20: 5 [IU] via SUBCUTANEOUS
  Filled 2024-11-17 (×2): qty 1
  Filled 2024-11-17: qty 3
  Filled 2024-11-17: qty 5
  Filled 2024-11-17: qty 1
  Filled 2024-11-17: qty 2

## 2024-11-17 MED ORDER — MYCOPHENOLATE MOFETIL 250 MG PO CAPS
500.0000 mg | ORAL_CAPSULE | Freq: Two times a day (BID) | ORAL | Status: DC
  Administered 2024-11-17 – 2024-11-20 (×7): 500 mg via ORAL
  Filled 2024-11-17 (×10): qty 2

## 2024-11-17 MED ORDER — METOPROLOL TARTRATE 25 MG PO TABS
25.0000 mg | ORAL_TABLET | Freq: Two times a day (BID) | ORAL | Status: DC
  Administered 2024-11-17 – 2024-11-20 (×7): 25 mg via ORAL
  Filled 2024-11-17 (×7): qty 1

## 2024-11-17 MED ORDER — INSULIN ASPART 100 UNIT/ML IV SOLN
10.0000 [IU] | Freq: Once | INTRAVENOUS | Status: AC
  Administered 2024-11-17: 10 [IU] via INTRAVENOUS
  Filled 2024-11-17: qty 1

## 2024-11-17 MED ORDER — HEPARIN BOLUS VIA INFUSION
4000.0000 [IU] | Freq: Once | INTRAVENOUS | Status: AC
  Administered 2024-11-17: 4000 [IU] via INTRAVENOUS

## 2024-11-17 MED ORDER — DEXTROSE 50 % IV SOLN: 0.0000 mL | INTRAVENOUS | Status: DC | PRN

## 2024-11-17 MED ORDER — LACTATED RINGERS IV SOLN: INTRAVENOUS | Status: AC

## 2024-11-17 MED ORDER — INSULIN ASPART 100 UNIT/ML IJ SOLN
0.0000 [IU] | Freq: Every day | INTRAMUSCULAR | Status: DC
  Administered 2024-11-17 – 2024-11-18 (×2): 2 [IU] via SUBCUTANEOUS
  Administered 2024-11-19: 4 [IU] via SUBCUTANEOUS
  Filled 2024-11-17: qty 4
  Filled 2024-11-17: qty 1
  Filled 2024-11-17: qty 2

## 2024-11-17 NOTE — Inpatient Diabetes Management (Signed)
 Inpatient Diabetes Program Recommendations  AACE/ADA: New Consensus Statement on Inpatient Glycemic Control (2015)  Target Ranges:  Prepandial:   less than 140 mg/dL      Peak postprandial:   less than 180 mg/dL (1-2 hours)      Critically ill patients:  140 - 180 mg/dL   Lab Results  Component Value Date   GLUCAP 103 (H) 11/17/2024   HGBA1C 6.8 (H) 10/29/2024    Review of Glycemic Control  Latest Reference Range & Units 11/17/24 06:52 11/17/24 07:59  Glucose-Capillary 70 - 99 mg/dL 91 896 (H)  (H): Data is abnormally high Diabetes history: Type 2 DM Outpatient Diabetes medications: Novolog  70/30 40 units QA/50 units QP Current orders for Inpatient glycemic control: Iv insulin   Inpatient Diabetes Program Recommendations:    When ready to transition consider: -Lantus  25 units two hours prior to discontinuation then every day to follow -Novolog  3 units TID (assuming patient is consuming >50% of meals) -Novolog  0-9 units TID & HS  Thanks, Tinnie Minus, MSN, RNC-OB Diabetes Coordinator [phone] (8a-5p)

## 2024-11-17 NOTE — TOC Initial Note (Addendum)
 Transition of Care [facility]) - Initial/Assessment Note    Patient Details  Name: [name] MRN: [id number] Date of Birth: Aug 27, 1941  Transition of Care [facility]) CM/SW Contact:    Ronnald MARLA Sil, RN Phone Number: 11/17/2024, 9:23 AM  Clinical Narrative:                 Patient with recent admissions in Nov & Dec for Fluid overload and Atypical chest pain, Patient is s/p diagnostic cath procedure on 1/16 showing worsening of multi vessel CAD and in setting of prior Lexiscan  myoview  showing abnormal view, Patient was urged to stay in the hospital for planned PCI on Tuesday but he wanted to go home. Patient readmitted yesterday for [place] .    CM visited contacted Patient's Spouse Rock at by phone, confirmed patient lives with spouse, is fairly independent with ADLs with baseline reliance on assistive devices, including Elec wheelchair, RW, Rollator, BSC, and Hospital Bed with Trapeze bar. Wife endorsed that neither she nor the patient drive and are reliant on Family and Friends for transportation. Rock also confirmed patient currently open with [facility] for PT, CM sent text msg to [facility] Liaison - [place] requesting confirmation on services.  CM will forward HH referrals once agency is confirmed, CM has also made multiple unsuccessful attempts to enter [place] Notification of admission, Spouse made aware, indicated she will attempt notification as well and would update CM when completed.  UPDATE at 10:30am: CM completed [facility] notification, ID is:# [phone]  [facility] team will continue to follow along to complete [facility] arrangements and monitor patient advancement through interdisciplinary progression rounds.  Please consider entering an ICM consult if any discharge needs arise.   Expected Discharge Plan: Home w Home Health Services Barriers to Discharge: Continued Medical Work up  Expected Discharge Plan and Services  Post Acute Care Choice: Home Health ([facility]) Living arrangements for the  past 2 months: Single Family Home HH Arranged: PT, OT HH Agency: [facility] Date [facility] Agency Contacted: 11/17/24 Time HH Agency Contacted: [phone] Representative spoke with at [facility] Agency: [place] via Text msg  Prior Living Arrangements/Services Living arrangements for the past 2 months: Single Family Home Lives with:: Spouse (Spouse GLENWOOD Rock: [phone]) Do you feel safe going back to the place where you live?: Yes      Current home services: Home OT, Home PT, Homehealth aide (PCS Aide thru [facility])  Emotional Assessment Appearance:: Appears stated age Attitude/Demeanor/Rapport: Engaged Affect (typically observed): Accepting, Stable Orientation: : Oriented to Self, Oriented to Place, Oriented to  Time, Oriented to Situation  Admission diagnosis:  Unstable angina (HCC) [I20.0] Patient Active Problem List   Diagnosis Date Noted   Unstable angina (HCC) 11/17/2024   Chronic heart failure with preserved ejection fraction (HFpEF) (HCC) 10/29/2024   Chronic kidney disease, stage 3b (HCC) 10/29/2024   PAF (paroxysmal atrial fibrillation) (HCC) 10/29/2024   Long term (current) use of anticoagulants 10/29/2024   Fluid overload 09/17/2024   Gynecomastia, male 01/17/2024   Dizziness 07/27/2022   Nausea & vomiting 07/27/2022   Atrial fibrillation with rapid ventricular response (HCC) 05/02/2020   Anemia of chronic disease 03/07/2020   CKD stage 3 due to type 2 diabetes mellitus (HCC) 06/12/2019   Chest pain 06/07/2018   Hypoxia    Lactic acidosis 02/24/2018   Dyspnea 02/20/2018   BPH (benign prostatic hyperplasia) 02/20/2018   DDD (degenerative disc disease), lumbar 10/17/2017   Chronic diastolic heart failure (HCC) 10/20/2015   Peripheral edema 01/22/2015  Tinea pedis 10/21/2014   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 11/27/2013   CKD (chronic kidney disease), stage IV (HCC) 11/21/2013   Left leg weakness 09/06/2013   Left-sided weakness 08/22/2013    OSA (obstructive sleep apnea) 04/12/2013   DM (diabetes mellitus) type II controlled with renal manifestation (HCC) 11/28/2012   PVD (peripheral vascular disease) (HCC) 11/28/2012   CAD (coronary artery disease) 11/28/2012   Mixed hyperlipidemia 11/28/2012   Obesity, Class III, BMI 40-49.9 (morbid obesity) (HCC) 11/28/2012   History of stroke 11/28/2012   OAB (overactive bladder) 11/28/2012   Essential hypertension 05/20/2011   PCP:  Darren Monetta RAMAN, MD Pharmacy:   [facility] [address] [place] - [address] [place] [address] Phone: [phone] Fax: [phone]  [facility] - [place], [place] - [address] [place] [address] [place] [address] Phone: [phone] Fax: [phone]  [facility]) - [place], [place] - [address] [place] [address] [place] [address] Phone: [phone] Fax: [phone]  [facility] [address] [place] [address] Phone: [phone] Fax: [phone]   Social Drivers of Health (SDOH) Social History: SDOH Screenings   Food Insecurity: No Food Insecurity (10/29/2024)  Housing: Low Risk (10/29/2024)  Transportation Needs: No Transportation Needs (10/29/2024)  Utilities: Not At Risk (10/29/2024)  Depression (PHQ2-9): High Risk (04/13/2022)  Social Connections: Moderately Integrated (10/29/2024)  Tobacco Use: Medium Risk (11/17/2024)   Readmission Risk Interventions    11/17/2024    9:16 AM 09/20/2024    9:36 AM  Readmission Risk Prevention Plan  Transportation Screening Complete Complete  PCP or Specialist Appt within 5-7 Days  Complete  Home Care Screening  Complete  Medication Review (RN CM)  Complete  Medication Review (RN Care Manager) Complete   HRI or Home Care Consult Complete   SW Recovery Care/Counseling Consult Complete

## 2024-11-17 NOTE — Progress Notes (Signed)
 PHARMACY - ANTICOAGULATION CONSULT NOTE  Pharmacy Consult for heparin  Indication: chest pain/ACS  Allergies[1]  Patient Measurements: Height: 5' 8 (172.7 cm) Weight: 115 kg (253 lb 8.5 oz) IBW/kg (Calculated) : 68.4 HEPARIN  DW (KG): 94.3  Vital Signs: Temp: 97.5 F (36.4 C) (01/17 1531) Temp Source: Oral (01/17 1531) BP: 112/52 (01/17 1600) Pulse Rate: 56 (01/17 1600)  Labs: Recent Labs    11/17/24 0041 11/17/24 0343 11/17/24 0520 11/17/24 0941 11/17/24 1941  HGB 9.7*  --  10.4*  --   --   HCT 30.3*  --  32.4*  --   --   PLT 236  --  248  --   --   APTT  --   --   --  135*  --   HEPARINUNFRC  --   --   --  0.77* 0.70  CREATININE 2.41* 2.41* 2.37* 2.05*  --     Estimated Creatinine Clearance: 34.8 mL/min (A) (by C-G formula based on SCr of 2.05 mg/dL (H)).   Medical History: Past Medical History:  Diagnosis Date   Anemia    C. difficile diarrhea    Chest pain 06/07/2018   CKD (chronic kidney disease), stage III (HCC)    Coronary artery disease    Status post CABG in 2007 [facility])   Diabetes mellitus with stage 1 chronic kidney disease (HCC) 05/20/2011   Dyspnea 02/20/2018   GERD (gastroesophageal reflux disease)    Glaucoma    Hyperlipidemia    Hypertension    Morbid obesity (HCC)    PAF (paroxysmal atrial fibrillation) (HCC)    Peripheral vascular disease    Sleep apnea    Stroke (HCC) 2008, 2014, 2015   Assessment: 59 yoM presented with chest pain s/p 3 SL NTG. Patient with recent LHC on 1/6 given recurring angina with plans for PCI on 1/22. Pharmacy consulted to dose heparin  for ACS.  Heparin  level 0.70 at higher end of goal range, trending downward.   Goal of Therapy:  Heparin  level 0.3-0.7 units/ml Monitor platelets by anticoagulation protocol: Yes   Plan:  Continue heparin  at 950 units/hr -Daily anti-Xa level -CBC daily   Bari Hamilton D PharmD., BCPS Clinical Pharmacist 11/17/2024 9:22 PM      [1]   Allergies Allergen Reactions   Benazepril Other (See Comments)    Unknown    Lisinopril Other (See Comments)    Unknown    Ivp Dye [Iodinated Contrast Media] Rash

## 2024-11-17 NOTE — ED Triage Notes (Signed)
 Pt here with c/o CP that started just before midnight per wife. Wife states she gave pt 3 SL NTG prior to coming to hospital. Wife reports pt had cardiac cath at [facility] on Friday and was told he had blockages and needed to stay in the hospital to have PCI on Monday but that pt did not want to stay. PCI scheduled as outpt for Thurs now.

## 2024-11-17 NOTE — Progress Notes (Signed)
 PHARMACY - ANTICOAGULATION CONSULT NOTE  Pharmacy Consult for heparin  Indication: chest pain/ACS  Allergies[1]  Patient Measurements: Height: 5' 8 (172.7 cm) Weight: 115 kg (253 lb 8.5 oz) IBW/kg (Calculated) : 68.4 HEPARIN  DW (KG): 94.3  Vital Signs: Temp: 98.2 F (36.8 C) (01/17 0050) Temp Source: Oral (01/17 0050) BP: 129/51 (01/17 0050) Pulse Rate: 67 (01/17 0050)  Labs: Recent Labs    11/14/24 1423 11/17/24 0041  HGB 10.0* 9.7*  HCT 31.9* 30.3*  PLT 211 236  CREATININE 1.99*  --     Estimated Creatinine Clearance: 35.8 mL/min (A) (by C-G formula based on SCr of 1.99 mg/dL (H)).   Medical History: Past Medical History:  Diagnosis Date   Anemia    C. difficile diarrhea    Chest pain 06/07/2018   CKD (chronic kidney disease), stage III (HCC)    Coronary artery disease    Status post CABG [facility])   Diabetes mellitus with stage 1 chronic kidney disease (HCC) 05/20/2011   Dyspnea 02/20/2018   GERD (gastroesophageal reflux disease)    Glaucoma    Hyperlipidemia    Hypertension    Morbid obesity (HCC)    PAF (paroxysmal atrial fibrillation) (HCC)    Peripheral vascular disease    Sleep apnea    Stroke (HCC) 2008, 2014, 2015   Assessment: 75 yoM presented with chest pain s/p 3 SL NTG. Patient with recent LHC on 1/6 given recurring angina with plans for PCI on 1/22. Pharmacy consulted to dose heparin  for ACS.  -Hgb 10s, plts WNL -Eliquis  2.5mg  for afib: last dose 1/15 due to cath  -Trop 76  Goal of Therapy:  Heparin  level 0.3-0.7 units/ml Monitor platelets by anticoagulation protocol: Yes   Plan:  -4000 units heparin  IV bolus -Start heparin  gtt at 1100 units/hr -8h anti-Xa level -CBC daily -F/u cards consult  Lynwood Poplar, PharmD, BCPS Clinical Pharmacist 11/17/2024 1:48 AM      [1]  Allergies Allergen Reactions   Benazepril Other (See Comments)    Unknown    Lisinopril Other (See Comments)    Unknown     Ivp Dye [Iodinated Contrast Media] Rash

## 2024-11-17 NOTE — H&P (Addendum)
 " History and Physical    Patient: [name] FMW:[id number] DOB: 10/21/1941 DOA: 11/17/2024 DOS: the patient was seen and examined on 11/17/2024 PCP: Darren Monetta RAMAN, MD  Patient coming from: Home  Chief Complaint:  Chief Complaint  Patient presents with   Chest Pain   HPI: [name] is a 84 y.o. male with medical history significant of 84 year old male with a history of HFpEF, multivessel CAD status post CABG in 2007, PAF on apixaban , lower extremity lymphedema, CKD stage IIIb, hypertension, hyperlipidemia, diabetes mellitus type 2 presenting with recurring chest pain after having diagnostic cath procedure on 1/16 showing worsening of multi vessel CAD. Lexiscan  myoview  prior showed abnormal view but normal LVEF.  Patient was urged to stay in the hospital for planned PCI on Tuesday but he wanted to go home. That evening he again had return of his chest pain, partially responsive to SL nitroglycerin .  Chest pain resolved with nitroglycerin .  Cardiolgy contacted by Dr Raford.  Plan for admit to medicine service and repeat cath with PCI likely Tuesday. Labs also shoed hyperglycemia with significan metabolic acidosis  Significant  labs in ED Bicarb 18, K 4.4, anion gap 17,  Troponin 76  101 Hgb 9.7hct 30.3  Glucose 515 EKG Sinus rhythm Prolonged PR interval Nonspecific IVCD with LAD  Review of Systems: As mentioned in the history of present illness. All other systems reviewed and are negative. Past Medical History:  Diagnosis Date   Anemia    C. difficile diarrhea    Chest pain 06/07/2018   CKD (chronic kidney disease), stage III (HCC)    Coronary artery disease    Status post CABG in 2007 [facility])   Diabetes mellitus with stage 1 chronic kidney disease (HCC) 05/20/2011   Dyspnea 02/20/2018   GERD (gastroesophageal reflux disease)    Glaucoma    Hyperlipidemia    Hypertension    Morbid obesity (HCC)    PAF (paroxysmal atrial fibrillation) (HCC)     Peripheral vascular disease    Sleep apnea    Stroke (HCC) 2008, 2014, 2015   Past Surgical History:  Procedure Laterality Date   CHOLECYSTECTOMY     CORONARY ARTERY BYPASS GRAFT     CYST EXCISION  12/2023   NO PAST SURGERIES     THYROID  SURGERY     Social History:  reports that he quit smoking about 43 years ago. His smoking use included cigarettes. He started smoking about 63 years ago. He has a 20 pack-year smoking history. He has never used smokeless tobacco. He reports that he does not drink alcohol  and does not use drugs.  Allergies[1]  Family History  Problem Relation Age of Onset   Diabetes Mother    Heart failure Mother    Hypertension Mother    Diabetes Father    Heart failure Father    Hypertension Father    Diabetes Sister    Heart failure Sister    Hypertension Sister    Hyperlipidemia Sister    Diabetes Brother    Heart failure Brother    Hypertension Brother     Prior to Admission medications  Medication Sig Start Date End Date Taking? Authorizing Provider  acetaminophen  (TYLENOL ) 650 MG CR tablet Take 1,300 mg by mouth every 8 (eight) hours as needed for pain.    [provider]  albuterol  (VENTOLIN  HFA) 108 (90 Base) MCG/ACT inhaler Inhale 1-2 puffs into the lungs every 6 (six) hours as needed for shortness of  breath. 03/23/22   [provider]  alum & mag hydroxide-simeth (MAALOX MAX) 400-400-40 MG/5ML suspension Take 15 mLs by mouth as needed for indigestion. 12/20/23   Kommor, Madison, MD  amLODipine  (NORVASC ) 10 MG tablet Take 10 mg by mouth daily.    [provider]  apixaban  (ELIQUIS ) 2.5 MG TABS tablet Take 2.5 mg by mouth 2 (two) times daily.    [provider]  atorvastatin  (LIPITOR) 80 MG tablet Take 80 mg by mouth daily.    [provider]  clopidogrel  (PLAVIX ) 75 MG tablet Take 1 tablet (75 mg total) by mouth daily. 11/16/24   Darryle Thom CROME, PA-C  colchicine  0.6 MG tablet Take 1.2 mg by mouth 2 (two)  times daily. 09/25/24   [provider]  diclofenac Sodium (VOLTAREN) 1 % GEL Apply 2 g topically every 6 (six) hours as needed (pain). 04/10/24   [provider]  diphenhydrAMINE  (BENADRYL ) 50 MG tablet Take 1 tablet (50 mg total) prior to leaving for hospital on 11/16/24 11/14/24   Debera Jayson MATSU, MD  ezetimibe  (ZETIA ) 10 MG tablet Take 1 tablet (10 mg total) by mouth daily. 07/25/23   Debera Jayson MATSU, MD  gabapentin  (NEURONTIN ) 300 MG capsule Take 300 mg by mouth 3 (three) times daily.    [provider]  glucose-Vitamin C  4-0.006 GM CHEW chewable tablet Chew 4 tablets by mouth as directed. CHEW FOUR TABLETS BY MOUTH AS DIRECTED BY YOUR PROVIDER (REPEAT EVERY 15 MINUTES IF BLOOD SUGAR LESS THAN 70) 03/09/22   [provider]  insulin  aspart protamine- aspart (NOVOLOG  MIX 70/30) (70-30) 100 UNIT/ML injection Inject 0.4 mLs (40 Units total) into the skin 2 (two) times daily with a meal. Patient taking differently: Inject 40-50 Units into the skin See admin instructions. Inject 40 units in the morning and 50 in the evening 07/29/22   Vicci Pen L, MD  isosorbide  mononitrate (IMDUR ) 30 MG 24 hr tablet Take 2 tablets (60 mg total) by mouth daily. 11/16/24 11/16/25  Darryle Thom CROME, PA-C  lidocaine  (LIDODERM ) 5 % Place 1 patch onto the skin daily. 07/09/24   [provider]  losartan  (COZAAR ) 25 MG tablet Take 25 mg by mouth daily.    [provider]  metoprolol  tartrate (LOPRESSOR ) 50 MG tablet Take 0.5 tablets (25 mg total) by mouth 2 (two) times daily. 09/20/24   Maree, Pratik D, DO  mycophenolate  (CELLCEPT ) 500 MG tablet Take by mouth 2 (two) times daily.    [provider]  nitroGLYCERIN  (NITROSTAT ) 0.4 MG SL tablet Place 1 tablet (0.4 mg total) under the tongue every 5 (five) minutes as needed for chest pain. 11/05/24   Raford Lenis, MD  predniSONE  (DELTASONE ) 50 MG tablet Take 1 tablet (50 mg total) at 10 pm on 11/15/24 and 1 tablet at 4  am on 11/16/24 and 1 tablet before leaving for hospital on 11/16/24 11/14/24   Debera Jayson MATSU, MD  Semaglutide (OZEMPIC, 0.25 OR 0.5 MG/DOSE, [place]) Inject 0.25 mg into the skin once a week. 02/26/22   [provider]  tamsulosin  (FLOMAX ) 0.4 MG CAPS capsule Take 0.4 mg by mouth every evening.    [provider]  torsemide  (DEMADEX ) 20 MG tablet Take 3 tablets (60 mg total) by mouth daily. Take 60 mg daily and take an extra 60mg  if worsening swelling or weight gain of over 3lbs noted in 24 hours. 09/20/24   Maree, Pratik D, DO  Vitamin D , Ergocalciferol , (DRISDOL) 1.25 MG (50000 UNIT)  CAPS capsule Take 50,000 Units by mouth once a week. 06/02/22   [provider]    Physical Exam: Vitals:   11/17/24 0200 11/17/24 0230 11/17/24 0330 11/17/24 0415  BP: (!) 126/47 (!) 126/57 116/62 118/71  Pulse: 66 72 68 67  Resp: 13 18 15 15   Temp:      TempSrc:      SpO2: 100% 99% 97% 98%  Weight:      Height:       Physical Activity: Not on file  Physical Exam Constitutional:      Appearance: He is obese.  HENT:     Head: Normocephalic and atraumatic.  Eyes:     Extraocular Movements: Extraocular movements intact.     Pupils: Pupils are equal, round, and reactive to light.  Cardiovascular:     Rate and Rhythm: Normal rate and regular rhythm.     Pulses:          Radial pulses are 2+ on the right side and 2+ on the left side.     Heart sounds: Murmur heard.  Pulmonary:     Effort: Pulmonary effort is normal.     Breath sounds: Normal breath sounds.  Abdominal:     General: Bowel sounds are normal.     Palpations: Abdomen is soft.  Skin:    General: Skin is warm and dry.  Neurological:     General: No focal deficit present.     Mental Status: He is alert.  Psychiatric:        Mood and Affect: Mood normal.        Behavior: Behavior normal.            Data Reviewed:  Assessment and Plan: Unstable angina -continue heparin  IV -nitroglycerin  and imdur  -monitor on  tele -cardiology consulted-plan admit to medical service to manage and cards to go cath with PCI Tuesday  Metabolic acidosis with hyperglycemia -beta hydrox pending -endotool for hyperglycemia -resume home regimen once cath / PCI comp  Acute kidney injury on CKD  -IV fluids -hold diuresis - monitor renal function -IV fluids -hold arb and neurontin   PAF Hold eliquis  Continue metoprolol   HFpEF -continue monitoring for fluid overload -restart ARB when renal function allows -Continue statin  -continue statin and zetia  -strict I & O -daily weight  Hypertension -continue norvasc  and metop, hold losartan   Hyperlipidemia -statin, zetia       Advance Care Planning:   Code Status: Full Code discussed with wife at bedside   Consults: cardiology  Family Communication: wife at bedside  Severity of Illness: The appropriate patient status for this patient is INPATIENT. Inpatient status is judged to be reasonable and necessary in order to provide the required intensity of service to ensure the patient's safety. The patient's presenting symptoms, physical exam findings, and initial radiographic and laboratory data in the context of their chronic comorbidities is felt to place them at high risk for further clinical deterioration. Furthermore, it is not anticipated that the patient will be medically stable for discharge from the hospital within 2 midnights of admission.   * I certify that at the point of admission it is my clinical judgment that the patient will require inpatient hospital care spanning beyond 2 midnights from the point of admission due to high intensity of service, high risk for further deterioration and high frequency of surveillance required.*  Author: Erminio Cone, NP 11/17/2024 4:41 AM  For on call review [email or web address].      [1]  Allergies  Allergen Reactions   Benazepril Other (See Comments)    Unknown    Lisinopril Other (See Comments)    Unknown     Ivp Dye [Iodinated Contrast Media] Rash   "

## 2024-11-17 NOTE — ED Notes (Signed)
 Pt c/o another round of cramps in his feet. Gave 2 packs of mustard per request, says this is what he does at home.

## 2024-11-17 NOTE — ED Notes (Signed)
"  Xr at bedside  "

## 2024-11-17 NOTE — ED Notes (Signed)
 ED Provider at bedside.

## 2024-11-17 NOTE — ED Provider Notes (Signed)
 " [place] EMERGENCY DEPARTMENT AT [facility] Provider Note   CSN: [id number] Arrival date & time: 11/17/24  0024     Patient presents with: Chest Pain   [name] is a 84 y.o. male.  {Add pertinent medical, surgical, social history, OB history to [id number]} The history is provided by the patient and the spouse.  Chest Pain  He has history of hypertension, diabetes, coronary artery disease, peripheral vascular disease, chronic kidney disease, paroxysmal atrial fibrillation anticoagulated on apixaban  and comes in because of recurrent episodes of chest pain which are coming on at rest.  He had been in the hospital for cardiac catheterization and was advised to stay in the hospital to have PCI done on 1/20.  He declined and the procedure has been scheduled for 1/22.  At home, he has been having ongoing episodes of chest pain which are always relieved with nitroglycerin .  He has had 3 episodes tonight.  Episodes have been coming on at rest.  He does endorse some mild dyspnea, nausea, diaphoresis.  He has not vomited.  Last dose of apixaban  was 1/15.    Prior to Admission medications  Medication Sig Start Date End Date Taking? Authorizing Provider  acetaminophen  (TYLENOL ) 650 MG CR tablet Take 1,300 mg by mouth every 8 (eight) hours as needed for pain.    [provider]  albuterol  (VENTOLIN  HFA) 108 (90 Base) MCG/ACT inhaler Inhale 1-2 puffs into the lungs every 6 (six) hours as needed for shortness of breath. 03/23/22   [provider]  alum & mag hydroxide-simeth (MAALOX MAX) 400-400-40 MG/5ML suspension Take 15 mLs by mouth as needed for indigestion. 12/20/23   Kommor, Madison, MD  amLODipine  (NORVASC ) 10 MG tablet Take 10 mg by mouth daily.    [provider]  apixaban  (ELIQUIS ) 2.5 MG TABS tablet Take 2.5 mg by mouth 2 (two) times daily.    [provider]  atorvastatin  (LIPITOR) 80 MG tablet Take 80 mg by mouth daily.    [provider]  clopidogrel  (PLAVIX ) 75 MG tablet Take 1 tablet (75 mg total) by mouth daily. 11/16/24   Darryle Thom CROME, PA-C  colchicine  0.6 MG tablet Take 1.2 mg by mouth 2 (two) times daily. 09/25/24   [provider]  diclofenac Sodium (VOLTAREN) 1 % GEL Apply 2 g topically every 6 (six) hours as needed (pain). 04/10/24   [provider]  diphenhydrAMINE  (BENADRYL ) 50 MG tablet Take 1 tablet (50 mg total) prior to leaving for hospital on 11/16/24 11/14/24   Debera Jayson MATSU, MD  ezetimibe  (ZETIA ) 10 MG tablet Take 1 tablet (10 mg total) by mouth daily. 07/25/23   Debera Jayson MATSU, MD  gabapentin  (NEURONTIN ) 300 MG capsule Take 300 mg by mouth 3 (three) times daily.    [provider]  glucose-Vitamin C  4-0.006 GM CHEW chewable tablet Chew 4 tablets by mouth as directed. CHEW FOUR TABLETS BY MOUTH AS DIRECTED BY YOUR PROVIDER (REPEAT EVERY 15 MINUTES IF BLOOD SUGAR LESS THAN 70) 03/09/22   [provider]  insulin  aspart protamine- aspart (NOVOLOG  MIX 70/30) (70-30) 100 UNIT/ML injection Inject 0.4 mLs (40 Units total) into the skin 2 (two) times daily with a meal. Patient taking differently: Inject 40-50 Units into the skin See admin instructions. Inject 40 units in the morning and 50 in the evening 07/29/22   Vicci, Clanford L, MD  isosorbide  mononitrate (IMDUR ) 30 MG 24 hr tablet Take 2 tablets (60 mg total) by  mouth daily. 11/16/24 11/16/25  Darryle Currier L, PA-C  lidocaine  (LIDODERM ) 5 % Place 1 patch onto the skin daily. 07/09/24   [provider]  losartan  (COZAAR ) 25 MG tablet Take 25 mg by mouth daily.    [provider]  metoprolol  tartrate (LOPRESSOR ) 50 MG tablet Take 0.5 tablets (25 mg total) by mouth 2 (two) times daily. 09/20/24   Maree, Pratik D, DO  mycophenolate  (CELLCEPT ) 500 MG tablet Take by mouth 2 (two) times daily.    [provider]  nitroGLYCERIN  (NITROSTAT ) 0.4 MG SL tablet Place 1 tablet (0.4 mg total) under the  tongue every 5 (five) minutes as needed for chest pain. 11/05/24   Raford Lenis, MD  predniSONE  (DELTASONE ) 50 MG tablet Take 1 tablet (50 mg total) at 10 pm on 11/15/24 and 1 tablet at 4 am on 11/16/24 and 1 tablet before leaving for hospital on 11/16/24 11/14/24   Debera Jayson MATSU, MD  Semaglutide (OZEMPIC, 0.25 OR 0.5 MG/DOSE, [place]) Inject 0.25 mg into the skin once a week. 02/26/22   [provider]  tamsulosin  (FLOMAX ) 0.4 MG CAPS capsule Take 0.4 mg by mouth every evening.    [provider]  torsemide  (DEMADEX ) 20 MG tablet Take 3 tablets (60 mg total) by mouth daily. Take 60 mg daily and take an extra 60mg  if worsening swelling or weight gain of over 3lbs noted in 24 hours. 09/20/24   Maree, Pratik D, DO  Vitamin D , Ergocalciferol , (DRISDOL) 1.25 MG (50000 UNIT) CAPS capsule Take 50,000 Units by mouth once a week. 06/02/22   [provider]    Allergies: Benazepril, Lisinopril, and Ivp dye [iodinated contrast media]    Review of Systems  Cardiovascular:  Positive for chest pain.  All other systems reviewed and are negative.   Updated Vital Signs BP (!) 129/51 (BP Location: Left Arm)   Pulse 67   Temp 98.2 F (36.8 C) (Oral)   Resp 15   Ht 5' 8 (1.727 m)   Wt 115 kg   SpO2 98%   BMI 38.55 kg/m   Physical Exam Vitals and nursing note reviewed.   84 year old male, resting comfortably and in no acute distress. Vital signs are normal. Oxygen  saturation is 98%, which is normal. Head is normocephalic and atraumatic. PERRLA, EOMI.  Lungs are clear without rales, wheezes, or rhonchi. Chest is nontender. Heart has regular rate and rhythm without murmur. Abdomen is soft, flat, nontender. Extremities have trace edema, full range of motion is present. Skin is warm and dry without rash. Neurologic: Awake and alert, moves all extremities equally.  (all labs ordered are listed, but only abnormal results are displayed) Labs Reviewed  BASIC METABOLIC PANEL WITH GFR -  Abnormal; Notable for the following components:      Result Value   Sodium 133 (*)    Chloride 96 (*)    CO2 21 (*)    Glucose, Bld 515 (*)    BUN 52 (*)    Creatinine, Ser 2.41 (*)    Calcium  8.4 (*)    GFR, Estimated 26 (*)    Anion gap 16 (*)    All other components within normal limits  CBC - Abnormal; Notable for the following components:   RBC 3.21 (*)    Hemoglobin 9.7 (*)    HCT 30.3 (*)    All other components within normal limits  TROPONIN T, HIGH SENSITIVITY - Abnormal; Notable for the following components:   Troponin T High  Sensitivity 76 (*)    All other components within normal limits  HEPARIN  LEVEL (UNFRACTIONATED)  APTT  BETA-HYDROXYBUTYRIC ACID  BASIC METABOLIC PANEL WITH GFR  TROPONIN T, HIGH SENSITIVITY    EKG: EKG Interpretation Date/Time:  Saturday November 17 2024 00:48:38 EST Ventricular Rate:  66 PR Interval:  246 QRS Duration:  125 QT Interval:  406 QTC Calculation: 426 R Axis:   -69  Text Interpretation: Sinus rhythm Prolonged PR interval Nonspecific IVCD with LAD Nonspecific T abnormalities, lateral leads When compared with ECG of 11/14/2024, No significant change was found Confirmed by Raford Lenis ([id number]) on 11/17/2024 12:50:09 AM  Radiology: DG Chest Portable 1 View Result Date: 11/17/2024 EXAM: 1 VIEW(S) XRAY OF THE CHEST 11/17/2024 12:59:57 AM COMPARISON: 11/05/2024 CLINICAL HISTORY: CP CP CP CP CP FINDINGS: LUNGS AND PLEURA: No focal pulmonary opacity. No pleural effusion. No pneumothorax. HEART AND MEDIASTINUM: Surgical changes in mediastinum. Intact sternotomy wires. No acute abnormality of the cardiac and mediastinal silhouettes. BONES AND SOFT TISSUES: Surgical clips in right neck. No acute osseous abnormality. IMPRESSION: 1. No acute process. Electronically signed by: Oneil Devonshire MD 11/17/2024 01:03 AM EST RP Workstation: [id number]   CARDIAC CATHETERIZATION Result Date: 11/16/2024 1.  Severe native multivessel coronary artery disease  with total occlusion of the proximal RCA, total occlusion of the mid LAD, severe stenosis of the proximal LAD extending into her first diagonal branch, and severe stenosis of a posterolateral branch of the circumflex. 2.  Status post multivessel CABG with continued patency of the LIMA to LAD, interval occlusion or subtotal occlusion of all the other vein grafts. 3.  Normal LVEDP Patient has very complex, calcified diffusely diseased coronary vessels.  There are a few potential targets for PCI, but they are heavily calcified leading into branch vessels.  Would favor a trial of medical therapy considering his poor functional capacity and comorbid conditions.  If he fails medical therapy, could attempt PCI of the LAD into the diagonal and of the left posterolateral branch.  I do not think the saphenous vein graft to PDA branch is salvageable.  It would require stenting the entire graft and I think the likelihood of that remaining patent is exceedingly low.    {Document cardiac monitor, telemetry assessment procedure when appropriate:32947} Procedures   Medications Ordered in the ED - No data to display    {Click here for ABCD2, HEART and other calculators REFRESH Note before signing:1}                              Medical Decision Making Amount and/or Complexity of Data Reviewed Labs: ordered. Radiology: ordered.  Risk OTC drugs. Prescription drug management. Decision regarding hospitalization.   Chest pain and patient with known severe coronary artery disease.  I am treating this as unstable angina.  I have reviewed his electrocardiogram, and my interpretation is nonspecific intraventricular conduction delay and nonspecific T wave abnormality, unchanged from prior.  I have reviewed his laboratory tests, my interpretation is stable anemia, metabolic panel and troponin pending.  I have ordered aspirin  and topical nitroglycerin  as well as intravenous heparin .  I have reviewed his past records, and  note cardiac catheterization done earlier today showing severe 3 vessel coronary artery disease and severe atherosclerotic disease in his saphenous vein grafts but with patent left internal mammary artery graft.  He will need to be admitted for urgent PCI.  I have reviewed his laboratory tests, and troponin is elevated  somewhat higher than before but still only mildly elevated, moderately elevated glucose with hyponatremia commensurate with degree of hyperglycemia, acute kidney injury with creatinine 2.41 compared with 1.99 on 11/14/2024, stable anemia.  Patient states that he has not taken his insulin  today which would account for the hyperglycemia.  I have ordered intravenous heparin .  He had recurrence of pain in did require morphine  for pain.  I discussed case with Dr. Winfred of cardiology service who feels that with the kidney injury and elevated glucose that he should be admitted under the hospitalist service with cardiology needs to see him in consultation.  I have discussed the case with the PA from [facility] who agrees to admit the patient.  CRITICAL CARE Performed by: Alm Lias Total critical care time: 55 minutes Critical care time was exclusive of separately billable procedures and treating other patients. Critical care was necessary to treat or prevent imminent or life-threatening deterioration. Critical care was time spent personally by me on the following activities: development of treatment plan with patient and/or surrogate as well as nursing, discussions with consultants, evaluation of patient's response to treatment, examination of patient, obtaining history from patient or surrogate, ordering and performing treatments and interventions, ordering and review of laboratory studies, ordering and review of radiographic studies, pulse oximetry and re-evaluation of patient's condition.  {Document critical care time when appropriate  Document review of labs and clinical decision  tools ie CHADS2VASC2, etc  Document your independent review of radiology images and any outside records  Document your discussion with family members, caretakers and with consultants  Document social determinants of health affecting pt's care  Document your decision making why or why not admission, treatments were needed:32947:::1}   Final diagnoses:  Unstable angina (HCC)  Acute renal failure superimposed on chronic kidney disease, unspecified acute renal failure type, unspecified CKD stage  Hyponatremia  Normochromic normocytic anemia  Hyperglycemia    ED Discharge Orders     None        "

## 2024-11-17 NOTE — ED Notes (Signed)
 Pt report was given by off going nurse. Pt heparin  drip stated that infusion was complete. I started the new bag in the pump. Pt requested a warm blanket. Same was given to him and he had no other needs to be addressed.

## 2024-11-17 NOTE — ED Notes (Signed)
 Provider at bedside.

## 2024-11-17 NOTE — Progress Notes (Signed)
 PHARMACY - ANTICOAGULATION CONSULT NOTE  Pharmacy Consult for heparin  Indication: chest pain/ACS  Allergies[1]  Patient Measurements: Height: 5' 8 (172.7 cm) Weight: 115 kg (253 lb 8.5 oz) IBW/kg (Calculated) : 68.4 HEPARIN  DW (KG): 94.3  Vital Signs: Temp: 97.8 F (36.6 C) (01/17 0715) Temp Source: Oral (01/17 0715) BP: 155/62 (01/17 0700) Pulse Rate: 63 (01/17 0700)  Labs: Recent Labs    11/14/24 1423 11/17/24 0041 11/17/24 0343 11/17/24 0520  HGB 10.0* 9.7*  --  10.4*  HCT 31.9* 30.3*  --  32.4*  PLT 211 236  --  248  CREATININE 1.99* 2.41* 2.41* 2.37*    Estimated Creatinine Clearance: 30.1 mL/min (A) (by C-G formula based on SCr of 2.37 mg/dL (H)).   Medical History: Past Medical History:  Diagnosis Date   Anemia    C. difficile diarrhea    Chest pain 06/07/2018   CKD (chronic kidney disease), stage III (HCC)    Coronary artery disease    Status post CABG in 2007 [facility])   Diabetes mellitus with stage 1 chronic kidney disease (HCC) 05/20/2011   Dyspnea 02/20/2018   GERD (gastroesophageal reflux disease)    Glaucoma    Hyperlipidemia    Hypertension    Morbid obesity (HCC)    PAF (paroxysmal atrial fibrillation) (HCC)    Peripheral vascular disease    Sleep apnea    Stroke (HCC) 2008, 2014, 2015   Assessment: 47 yoM presented with chest pain s/p 3 SL NTG. Patient with recent LHC on 1/6 given recurring angina with plans for PCI on 1/22. Pharmacy consulted to dose heparin  for ACS.  Heparin  level 0.77, above goal. No bleeding or IV issues noted.   Goal of Therapy:  Heparin  level 0.3-0.7 units/ml Monitor platelets by anticoagulation protocol: Yes   Plan:  Decrease heparin  to 950 units/hr -8h anti-Xa level -CBC daily -F/u cards consult  Dempsey Blush PharmD., BCPS Clinical Pharmacist 11/17/2024 7:57 AM     [1]  Allergies Allergen Reactions   Benazepril Other (See Comments)    Unknown    Lisinopril Other (See  Comments)    Unknown    Ivp Dye [Iodinated Contrast Media] Rash

## 2024-11-17 NOTE — Progress Notes (Signed)
 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  Patient was told to stay in the hospital for further hydration and stabilization and anticipated PCI next week.  Patient declined desire to go home.  Return back with ongoing chest pain and concern for unstable angina.  Assessment and plan 1-chest pain/unstable angina -Continue heparin  drip, statin and Plavix  - Continue the use of as needed nitroglycerin  and Imdur  - Continue telemetry monitoring - Cardiology service has been consulted and will follow patient on arrival to [facility] with anticipated cardiac cath and PCI most likely 11/19/2024.  2-type 2 diabetes with nephropathy and hyperglycemia - Patient with insulin  therapy as an outpatient - At time of presentation there was concern for maybe early DKA; excellent response and complete resolution after initiating insulin  drip - Patient has been transition to sliding scale insulin  and long-acting insulin  - Continue modified carbohydrate diet - Maintain adequate hydration - Follow CBGs fluctuation.  3-acute kidney injury on chronic kidney disease - Most likely associated with the use of contrast during recent cardiac cath - Will continue to hold nephrotoxic agents/diuretics and provide fluid resuscitation - Closely follow renal function trend.  4-paroxysmal atrial fibrillation - Will continue to hold Eliquis  and  anticipated cardiac cath on 11/19/2024 - Patient currently receiving heparin  drip as part of treatment of unstable angina - Continue the use of metoprolol  for rate control. - Continue telemetry monitoring and follow electrolytes.  5-chronic diastolic heart failure - Appears to be stable and compensated - Continue to follow daily weights and strict intake and output - Good saturation on room air appreciated.  6-hypertension - Continue current antihypertensive agents - Follow-up vital sign - Holding losartan  in the setting of acute kidney injury.  7-hyperlipidemia - Continue statin and Zetia  - Heart healthy diet has been ordered.  8-class II obesity -Body mass index is 38.55 kg/m.  -Low-calorie diet, portion control and increase physical activity discussed with patient.  Subjective:  Patient is afebrile, in no acute distress and currently reporting no chest pain.  Good saturation on room air appreciated.  Physical Exam: Vitals:   11/17/24 0715 11/17/24 0800 11/17/24 0930 11/17/24 1000  BP:  121/62 139/64 130/65  Pulse:  (!) 59 65 69  Resp:  14 14 13   Temp: 97.8 F (36.6 C)     TempSrc: Oral     SpO2:  100% 97% 97%  Weight:      Height:       General exam: Alert, awake, oriented x 3; in no major distress.  Good saturation on room air. Respiratory system: Good air movement bilaterally; no wheezing, no crackles. Cardiovascular system: Rate controlled, no rubs, no gallops, no JVD appreciated on exam. Gastrointestinal system: Abdomen is obese, nondistended, soft and nontender. No organomegaly or masses felt. Normal bowel sounds heard. Central nervous system: No focal neurological deficits. Extremities: No cyanosis or clubbing; trace to 1+ edema appreciated bilaterally (unchanged from baseline according to patient).. Skin: No petechiae. Psychiatry: Judgement and insight appear normal. Mood & affect  appropriate.    Data Reviewed: Comprehensive metabolic panel: Sodium 139,  potassium 4.1, chloride 101, bicarb 21, BUN 53, creatinine 2.3, normal LFTs and GFR 27 CBC: WBCs 13.7, hemoglobin 10.4 and platelet count 248K Magnesium : 2.6  Family Communication: Wife at bedside  Disposition: Status is: Inpatient Remains inpatient appropriate because: Continue IV therapy.    Time spent: 50 minutes  Author: Eric Nunnery, MD 11/17/2024 11:00 AM  For on call review [email or web address].    "

## 2024-11-17 NOTE — ED Notes (Signed)
 Insulin  stopped per endo tool. Cbg to be rechecked at 0658. Continue D5LR per hospitalist.

## 2024-11-18 DIAGNOSIS — I214 Non-ST elevation (NSTEMI) myocardial infarction: Principal | ICD-10-CM

## 2024-11-18 DIAGNOSIS — N189 Chronic kidney disease, unspecified: Secondary | ICD-10-CM | POA: Diagnosis not present

## 2024-11-18 DIAGNOSIS — R739 Hyperglycemia, unspecified: Secondary | ICD-10-CM | POA: Diagnosis not present

## 2024-11-18 DIAGNOSIS — E871 Hypo-osmolality and hyponatremia: Secondary | ICD-10-CM | POA: Diagnosis not present

## 2024-11-18 DIAGNOSIS — I200 Unstable angina: Secondary | ICD-10-CM | POA: Diagnosis not present

## 2024-11-18 DIAGNOSIS — N179 Acute kidney failure, unspecified: Secondary | ICD-10-CM | POA: Diagnosis not present

## 2024-11-18 LAB — BASIC METABOLIC PANEL WITH GFR
Anion gap: 10 (ref 5–15)
BUN: 45 mg/dL — ABNORMAL HIGH (ref 8–23)
CO2: 24 mmol/L (ref 22–32)
Calcium: 8.3 mg/dL — ABNORMAL LOW (ref 8.9–10.3)
Chloride: 104 mmol/L (ref 98–111)
Creatinine, Ser: 1.65 mg/dL — ABNORMAL HIGH (ref 0.61–1.24)
GFR, Estimated: 41 mL/min — ABNORMAL LOW
Glucose, Bld: 178 mg/dL — ABNORMAL HIGH (ref 70–99)
Potassium: 4.4 mmol/L (ref 3.5–5.1)
Sodium: 138 mmol/L (ref 135–145)

## 2024-11-18 LAB — CBC
HCT: 28.3 % — ABNORMAL LOW (ref 39.0–52.0)
Hemoglobin: 9 g/dL — ABNORMAL LOW (ref 13.0–17.0)
MCH: 30.1 pg (ref 26.0–34.0)
MCHC: 31.8 g/dL (ref 30.0–36.0)
MCV: 94.6 fL (ref 80.0–100.0)
Platelets: 191 K/uL (ref 150–400)
RBC: 2.99 MIL/uL — ABNORMAL LOW (ref 4.22–5.81)
RDW: 13.4 % (ref 11.5–15.5)
WBC: 6.5 K/uL (ref 4.0–10.5)
nRBC: 0 % (ref 0.0–0.2)

## 2024-11-18 LAB — GLUCOSE, CAPILLARY
Glucose-Capillary: 239 mg/dL — ABNORMAL HIGH (ref 70–99)
Glucose-Capillary: 236 mg/dL — ABNORMAL HIGH (ref 70–99)
Glucose-Capillary: 241 mg/dL — ABNORMAL HIGH (ref 70–99)

## 2024-11-18 LAB — HEPARIN LEVEL (UNFRACTIONATED): Heparin Unfractionated: 0.62 [IU]/mL (ref 0.30–0.70)

## 2024-11-18 LAB — CBG MONITORING, ED: Glucose-Capillary: 151 mg/dL — ABNORMAL HIGH (ref 70–99)

## 2024-11-18 MED ORDER — ASPIRIN 81 MG PO TBEC
81.0000 mg | DELAYED_RELEASE_TABLET | Freq: Every day | ORAL | Status: DC
  Administered 2024-11-18 – 2024-11-20 (×3): 81 mg via ORAL
  Filled 2024-11-18 (×3): qty 1

## 2024-11-18 NOTE — Progress Notes (Signed)
 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  Patient was told to stay in the hospital for further hydration and stabilization and anticipated PCI next week.  Patient declined desire to go home.  Return back with ongoing chest pain and concern for unstable angina.  Assessment and plan 1-chest pain/unstable angina -Continue heparin  drip, statin and Plavix  - Continue the use of as needed nitroglycerin  and Imdur  - Continue telemetry monitoring - Cardiology service has been consulted and will follow patient on arrival to [facility] with anticipated cardiac cath and PCI most likely 11/19/2024.   2-type 2 diabetes with nephropathy and hyperglycemia - Patient with insulin  therapy as an outpatient - At time of presentation there was concern for maybe early DKA; excellent response and complete resolution after initiating insulin  drip - Patient has been transition to sliding scale insulin  and long-acting insulin  - Continue modified carbohydrate diet - Maintain adequate hydration - Follow CBGs fluctuation.   3-acute kidney injury on chronic kidney disease - Most likely associated with the use of contrast during recent cardiac cath - Will continue to hold nephrotoxic agents/diuretics and provide fluid resuscitation - Closely follow renal function trend. -Patient creatinine down to 1.65 currently   4-paroxysmal atrial  fibrillation - Will continue to hold Eliquis  and anticipated cardiac cath on 11/19/2024 - Patient currently receiving heparin  drip as part of treatment of unstable angina - Continue the use of metoprolol  for rate control. - Continue telemetry monitoring and follow electrolytes.   5-chronic diastolic heart failure - Appears to be stable and compensated - Continue to follow daily weights and strict intake and output - Good saturation on room air appreciated. -Low-sodium diet discussed with patient.   6-hypertension - Continue current antihypertensive agents - Follow-up vital sign and adjust medication as needed - Continue to hold losartan  for now in the setting of acute kidney injury.   7-hyperlipidemia - Continue statin and Zetia  - Patient has been encouraged to follow heart healthy diet and to monitor sodium intake.   8-class II obesity -Body mass index is 38.55 kg/m.  -Low-calorie diet, portion control and increase physical activity discussed with patient.    Subjective:  Chest pain-free; no nausea, no vomiting, no fever and good saturation on room air.  Physical Exam: Vitals:   11/18/24 0830 11/18/24 0900 11/18/24 0930 11/18/24 1113  BP: (!) 140/58 132/76 (!) 136/49   Pulse: 67 67 64   Resp: 16 15 14    Temp:    98.1 F (36.7 C)  TempSrc:    Axillary  SpO2: 95% 100% 100%   Weight:      Height:       General exam: Alert, awake, oriented x 3; in no acute distress. Respiratory system: No using accessory muscles; good saturation on room air. Cardiovascular system: Rate controlled. No rubs, no gallops, no JVD. Gastrointestinal system: Abdomen is obese, nondistended, soft and nontender. No organomegaly or masses felt. Normal bowel sounds heard. Central nervous system: Moving 4 limbs spontaneously.  No focal neurological deficits.  Extremities: No cyanosis or clubbing; trace edema appreciated bilaterally. Skin: No petechiae. Psychiatry: Judgement and insight appear normal.  Mood & affect appropriate.    Data Reviewed: Basic metabolic panel: Sodium 138, potassium 4.4, chloride 104, bicarb 24, BUN 45, creatinine 1.65 and GFR 41; anion gap 10. CBC: White blood cell 6.5, hemoglobin 9.0 and platelet count 191 K.  Family Communication: Wife at bedside.  Disposition: Status is: Inpatient Remains inpatient appropriate because: Continue with therapy.  Time spent: 50 minutes  Author: Eric Nunnery, MD 11/18/2024 11:46 AM  For on call review [email or web address].    "

## 2024-11-18 NOTE — Consult Note (Signed)
 " Cardiology Consultation:  Patient ID: [name] MRN: [id number]; DOB: 10-17-1941  Admit date: 11/17/2024 Date of Consult: 11/18/2024  Primary Care Provider: Darren Monetta RAMAN, MD Primary Cardiologist: Jayson Sierras, MD  Primary Electrophysiologist:  None   Patient Profile:  [name] is a 84 y.o. male with a hx of HFpEF, CAD s/p CABG, pAF, CKD 3b, HTN, HLD who is being seen today for the evaluation of NSTEMI at the request of Eric Nunnery, MD.  History of Present Illness:  [name] is admitted to the hospital on 11/17/2024 with chest pain and elevated troponin concerning for non-STEMI.  He underwent a left heart catheterization after abnormal stress test on 11/16/2024.  Poor targets for PCI.  There is mention of trial medical therapy and then reevaluating PCI to the LAD or PLV.  He reports when he got home he continued to have more chest discomfort.  He apparently did not take his insulin . He was readmitted with severe hyperglycemia with glucose 515.  Serum creatinine was also up to 2.41.  That has since trended down.  Cardiac enzymes have trended up from initial troponin of 101 up to 146.  Hemoglobin 9.0.  Chest x-ray without acute process.  EKG shows sinus rhythm heart rate 66 with nonspecific IVCD, no acute ischemic changes.  He was admitted to [facility].  He was then transferred to [facility] for plan for PCI tomorrow.  He is resting comfortably in his room.  Reports no shortness of breath or chest discomfort.  Serum creatinine has improved to 1.65.  Hemoglobin stable at 9.0.    Past Medical History: Past Medical History:  Diagnosis Date   Anemia    C. difficile diarrhea    Chest pain 06/07/2018   CKD (chronic kidney disease), stage III (HCC)    Coronary artery disease    Status post CABG in 2007 [facility])   Diabetes mellitus with stage 1 chronic kidney disease (HCC) 05/20/2011   Dyspnea 02/20/2018   GERD (gastroesophageal reflux  disease)    Glaucoma    Hyperlipidemia    Hypertension    Morbid obesity (HCC)    PAF (paroxysmal atrial fibrillation) (HCC)    Peripheral vascular disease    Sleep apnea    Stroke (HCC) 2008, 2014, 2015    Past Surgical History: Past Surgical History:  Procedure Laterality Date   CHOLECYSTECTOMY     CORONARY ARTERY BYPASS GRAFT     CYST EXCISION  12/2023   LEFT HEART CATH AND CORS/GRAFTS ANGIOGRAPHY N/A 11/16/2024   Procedure: LEFT HEART CATH AND CORS/GRAFTS ANGIOGRAPHY;  Surgeon: Wonda Sharper, MD;  Location: [facility] INVASIVE CV LAB;  Service: Cardiovascular;  Laterality: N/A;   NO PAST SURGERIES     THYROID  SURGERY       Allergies:    Allergies[1]  Social History:   Social History   Socioeconomic History   Marital status: Married    Spouse name: Rock   Number of children: Not on file   Years of education: Not on file   Highest education level: Not on file  Occupational History   Not on file  Tobacco Use   Smoking status: Former    Current packs/day: 0.00    Average packs/day: 1 pack/day for 20.0 years (20.0 ttl pk-yrs)    Types: Cigarettes    Start date: 1963    Quit date: 70    Years since quitting: 43.0   Smokeless tobacco: Never  Vaping Use  Vaping status: Never Used  Substance and Sexual Activity   Alcohol  use: No   Drug use: No   Sexual activity: Not Currently  Other Topics Concern   Not on file  Social History Narrative   1 child, 4 step-children    Social Drivers of Health   Tobacco Use: Medium Risk (11/17/2024)   Patient History    Smoking Tobacco Use: Former    Smokeless Tobacco Use: Never    Passive Exposure: Not on Actuary Strain: Not on file  Food Insecurity: No Food Insecurity (11/18/2024)   Epic    Worried About Programme Researcher, Broadcasting/film/video in the Last Year: Never true    Ran Out of Food in the Last Year: Never true  Transportation Needs: No Transportation Needs (11/18/2024)   Epic    Lack of Transportation (Medical): No     Lack of Transportation (Non-Medical): No  Physical Activity: Not on file  Stress: Not on file  Social Connections: Moderately Integrated (11/18/2024)   Social Connection and Isolation Panel    Frequency of Communication with Friends and Family: Once a week    Frequency of Social Gatherings with Friends and Family: Once a week    Attends Religious Services: More than 4 times per year    Active Member of [employer] or Organizations: Yes    Attends Banker Meetings: More than 4 times per year    Marital Status: Married  Catering Manager Violence: Not At Risk (11/18/2024)   Epic    Fear of Current or Ex-Partner: No    Emotionally Abused: No    Physically Abused: No    Sexually Abused: No  Depression (PHQ2-9): High Risk (04/13/2022)   Depression (PHQ2-9)    PHQ-2 Score: 14  Alcohol  Screen: Not on file  Housing: Low Risk (11/18/2024)   Epic    Unable to Pay for Housing in the Last Year: No    Number of Times Moved in the Last Year: 0    Homeless in the Last Year: No  Utilities: Not At Risk (11/18/2024)   Epic    Threatened with loss of utilities: No  Health Literacy: Not on file     Family History:    Family History  Problem Relation Age of Onset   Diabetes Mother    Heart failure Mother    Hypertension Mother    Diabetes Father    Heart failure Father    Hypertension Father    Diabetes Sister    Heart failure Sister    Hypertension Sister    Hyperlipidemia Sister    Diabetes Brother    Heart failure Brother    Hypertension Brother      ROS:  All other ROS reviewed and negative. Pertinent positives noted in the HPI.     Physical Exam/Data:   Vitals:   11/18/24 1130 11/18/24 1200 11/18/24 1230 11/18/24 1409  BP: (!) 161/84 132/61 (!) 130/56 (!) 117/40  Pulse: 67 60 (!) 57 64  Resp: (!) 21 15 14 16   Temp:    98.1 F (36.7 C)  TempSrc:    Oral  SpO2: 95% 100% 100% 100%  Weight:      Height:        Intake/Output Summary (Last 24 hours) at 11/18/2024  1541 Last data filed at 11/17/2024 1958 Gross per 24 hour  Intake 741.02 ml  Output --  Net 741.02 ml       11/17/2024   12:45 AM 11/16/2024  6:25 AM 11/14/2024    1:08 PM  Last 3 Weights  Weight (lbs) 253 lb 8.5 oz 254 lb 254 lb  Weight (kg) 115 kg 115.214 kg 115.214 kg    Body mass index is 38.55 kg/m.  General: Well nourished, well developed, in no acute distress Head: Atraumatic, normal size  Eyes: PEERLA, EOMI  Neck: Supple, no JVD Endocrine: No thryomegaly Cardiac: Normal S1, S2; RRR; no murmurs, rubs, or gallops Lungs: Clear to auscultation bilaterally, no wheezing, rhonchi or rales  Abd: Soft, nontender, no hepatomegaly  Ext: No edema, pulses 2+ Musculoskeletal: No deformities, BUE and BLE strength normal and equal Skin: Warm and dry, no rashes   Neuro: Alert and oriented to person, place, time, and situation, CNII-XII grossly intact, no focal deficits  Psych: Normal mood and affect   EKG:  The EKG was personally reviewed and demonstrates: Normal sinus rhythm heart rate 66, nonspecific IVCD Telemetry:  Telemetry was personally reviewed and demonstrates:  SR 70s  Relevant CV Studies: LHC 11/16/2024 1.  Severe native multivessel coronary artery disease with total occlusion of the proximal RCA, total occlusion of the mid LAD, severe stenosis of the proximal LAD extending into her first diagonal branch, and severe stenosis of a posterolateral branch of the circumflex. 2.  Status post multivessel CABG with continued patency of the LIMA to LAD, interval occlusion or subtotal occlusion of all the other vein grafts. 3.  Normal LVEDP   TTE 09/18/2024  1. Left ventricular ejection fraction, by estimation, is 55 to 60%. The  left ventricle has normal function. Left ventricular endocardial border  not optimally defined to evaluate regional wall motion. There is mild left  ventricular hypertrophy. Left  ventricular diastolic parameters are consistent with Grade I diastolic   dysfunction (impaired relaxation).   2. Right ventricular systolic function was not well visualized. The right  ventricular size is not well visualized. Tricuspid regurgitation signal is  inadequate for assessing PA pressure.   3. The mitral valve was not well visualized. No evidence of mitral valve  regurgitation. No evidence of mitral stenosis.   4. The aortic valve was not well visualized. There is mild calcification  of the aortic valve. There is mild thickening of the aortic valve. Aortic  valve regurgitation is not visualized. No aortic stenosis is present.   5. Technically difficult study, limited visualization.   Assessment and Plan:   # NSTEMI vs Demand Ischemia # CAD s/p CABG - Readmitted with chest discomfort in setting of severe hyperglycemia.  Also had an AKI.  This could be demand but he does have significant obstructive CAD.  He status post CABG with cardiac cath on 11/16/2024.  Plan was for medical therapy before considering PCI to the LAD and first diagonal or PLV but given his recurrent symptoms we will proceed with this hopefully tomorrow.  N.p.o. at midnight. - Continue aspirin  and heparin  drip.  He has not had any Eliquis  in several days. - Continue Plavix  75 mg daily.  Add aspirin  81 mg daily.  Antiplatelet management per interventional after PCI. - Continue antianginal therapy including amlodipine  10 mg daily, Imdur  60 mg daily metoprolol  tartrate 25 mg twice daily. - Continue Lipitor and Zetia .  Recheck lipids tomorrow.  Informed Consent   Shared Decision Making/Informed Consent The risks [stroke (1 in 1000), death (1 in 1000), kidney failure [usually temporary] (1 in 500), bleeding (1 in 200), allergic reaction [possibly serious] (1 in 200)], benefits (diagnostic support and management of coronary artery disease)  and alternatives of a cardiac catheterization were discussed in detail with Mr. Sattar and he is willing to proceed.      # Paroxysmal Afib -  Maintaining sinus rhythm.  Has been off Eliquis  since prior to heart cath on 11/16/2024.  Continue heparin  drip for now.  # AKI  # CKD 3b - Had slight AKI on admission.  Likely related to contrast from prior cath and hyperglycemia.  Serum creatinine has returned to baseline.  This is reassuring.  # DM - Hyperglycemic management per hospital medicine team.  Most recent A1c 6.8.  Would benefit from GLP-1 agonist as an outpatient.    For questions or updates, please contact [facility] Please consult [email or web address] for contact info under      Signed, Darryle T. Barbaraann, MD, [facility]   [facility]  11/18/2024 3:41 PM     [1]  Allergies Allergen Reactions   Benazepril Other (See Comments)    Unknown    Lisinopril Other (See Comments)    Unknown    Ivp Dye [Iodinated Contrast Media] Rash   "

## 2024-11-18 NOTE — Progress Notes (Signed)
 PHARMACY - ANTICOAGULATION CONSULT NOTE  Pharmacy Consult for heparin  Indication: chest pain/ACS  Allergies[1]  Patient Measurements: Height: 5' 8 (172.7 cm) Weight: 115 kg (253 lb 8.5 oz) IBW/kg (Calculated) : 68.4 HEPARIN  DW (KG): 94.3  Vital Signs: Temp: 98.1 F (36.7 C) (01/18 0030) Temp Source: Oral (01/18 0030) BP: 135/49 (01/18 0545) Pulse Rate: 63 (01/18 0545)  Labs: Recent Labs    11/17/24 0041 11/17/24 0343 11/17/24 0520 11/17/24 0941 11/17/24 1941 11/18/24 0500  HGB 9.7*  --  10.4*  --   --  9.0*  HCT 30.3*  --  32.4*  --   --  28.3*  PLT 236  --  248  --   --  191  APTT  --   --   --  135*  --   --   HEPARINUNFRC  --   --   --  0.77* 0.70 0.62  CREATININE 2.41*   < > 2.37* 2.05*  --  1.65*   < > = values in this interval not displayed.    Estimated Creatinine Clearance: 43.2 mL/min (A) (by C-G formula based on SCr of 1.65 mg/dL (H)).   Medical History: Past Medical History:  Diagnosis Date   Anemia    C. difficile diarrhea    Chest pain 06/07/2018   CKD (chronic kidney disease), stage III (HCC)    Coronary artery disease    Status post CABG in 2007 [facility])   Diabetes mellitus with stage 1 chronic kidney disease (HCC) 05/20/2011   Dyspnea 02/20/2018   GERD (gastroesophageal reflux disease)    Glaucoma    Hyperlipidemia    Hypertension    Morbid obesity (HCC)    PAF (paroxysmal atrial fibrillation) (HCC)    Peripheral vascular disease    Sleep apnea    Stroke (HCC) 2008, 2014, 2015   Assessment: 74 yoM presented with chest pain s/p 3 SL NTG. Patient with recent LHC on 1/6 given recurring angina with plans for PCI on 1/22. Pharmacy consulted to dose heparin  for ACS.  Heparin  level 0.62, now at goal. No bleeding or IV issues noted.   Goal of Therapy:  Heparin  level 0.3-0.7 units/ml Monitor platelets by anticoagulation protocol: Yes   Plan:  Continue heparin  at 950 units/hr -Daily heparin  level -CBC daily    Plan to transfer to [facility] when bed available for cardiac workup  Dempsey Blush PharmD., BCPS Clinical Pharmacist 11/18/2024 7:52 AM      [1]  Allergies Allergen Reactions   Benazepril Other (See Comments)    Unknown    Lisinopril Other (See Comments)    Unknown    Ivp Dye [Iodinated Contrast Media] Rash

## 2024-11-18 NOTE — Progress Notes (Signed)
 The patient is a 84 yr old man who had undergone an LHC performed on 11/14/2025 which showed  severe native multivessel coronary artery disease with total occlusion of the proximal RCA, total occlusion of the mid LAD, severe stenosis of the proximal LAD extending into her first diagonal branch, and severe stenosis of a posterolateral branch of the circumflex.   He was asked to be admitted for planned PCI on Tuesday, 11/14/2024, but he chose to go home.  On 11/17/2025 he returned to the ED at [facility] with complaints of chest pain that resolved with nitroglycerin  and elevated troponins concerning for NSTEMI. His last dose of Eliquis  was 11/15/2024.   In the ED the patient has a glucoses of 515, and an anion gap of 15, and a bicarb of 21. Creatinine was elevated at 2.4 (baseline 1.7-2.0) and troponins that trended from 107>127>146.  The patient was started on a heparin  drip for unstable anginal. Cardiology was consulted. The patient was transferred to [facility] when a bed became available. He was evaluted by Dr. Barbaraann of cardiology once he arrived. The plan is for LHC hopefully on 11/19/2024, although it may be the next day.  The patient is resting comfortably. No new complaints. Heart and Lung exam is within normal limits. Abdomen is soft, non-tender, non-distended.   The patient will be NPO after midnight.

## 2024-11-19 ENCOUNTER — Encounter (HOSPITAL_COMMUNITY)
Admission: EM | Disposition: A | Discharge status: Home Health | Payer: Self-pay | Source: Home / Self Care | Attending: Internal Medicine

## 2024-11-19 DIAGNOSIS — I200 Unstable angina: Secondary | ICD-10-CM | POA: Diagnosis not present

## 2024-11-19 DIAGNOSIS — I25110 Atherosclerotic heart disease of native coronary artery with unstable angina pectoris: Secondary | ICD-10-CM | POA: Diagnosis not present

## 2024-11-19 HISTORY — PX: CORONARY STENT INTERVENTION: CATH118234

## 2024-11-19 LAB — BASIC METABOLIC PANEL WITH GFR
Anion gap: 8 (ref 5–15)
BUN: 38 mg/dL — ABNORMAL HIGH (ref 8–23)
CO2: 24 mmol/L (ref 22–32)
Calcium: 8.2 mg/dL — ABNORMAL LOW (ref 8.9–10.3)
Chloride: 105 mmol/L (ref 98–111)
Creatinine, Ser: 1.48 mg/dL — ABNORMAL HIGH (ref 0.61–1.24)
GFR, Estimated: 47 mL/min — ABNORMAL LOW
Glucose, Bld: 183 mg/dL — ABNORMAL HIGH (ref 70–99)
Potassium: 4.3 mmol/L (ref 3.5–5.1)
Sodium: 137 mmol/L (ref 135–145)

## 2024-11-19 LAB — CBC
HCT: 25.7 % — ABNORMAL LOW (ref 39.0–52.0)
HCT: 25.8 % — ABNORMAL LOW (ref 39.0–52.0)
Hemoglobin: 8.3 g/dL — ABNORMAL LOW (ref 13.0–17.0)
Hemoglobin: 8.4 g/dL — ABNORMAL LOW (ref 13.0–17.0)
MCH: 30.1 pg (ref 26.0–34.0)
MCH: 30.4 pg (ref 26.0–34.0)
MCHC: 32.3 g/dL (ref 30.0–36.0)
MCHC: 32.6 g/dL (ref 30.0–36.0)
MCV: 93.1 fL (ref 80.0–100.0)
MCV: 93.5 fL (ref 80.0–100.0)
Platelets: 176 K/uL (ref 150–400)
Platelets: 189 K/uL (ref 150–400)
RBC: 2.76 MIL/uL — ABNORMAL LOW (ref 4.22–5.81)
RBC: 2.76 MIL/uL — ABNORMAL LOW (ref 4.22–5.81)
RDW: 13.4 % (ref 11.5–15.5)
RDW: 13.6 % (ref 11.5–15.5)
WBC: 6 K/uL (ref 4.0–10.5)
WBC: 5.6 K/uL (ref 4.0–10.5)
nRBC: 0 % (ref 0.0–0.2)
nRBC: 0 % (ref 0.0–0.2)

## 2024-11-19 LAB — GLUCOSE, CAPILLARY
Glucose-Capillary: 162 mg/dL — ABNORMAL HIGH (ref 70–99)
Glucose-Capillary: 139 mg/dL — ABNORMAL HIGH (ref 70–99)
Glucose-Capillary: 114 mg/dL — ABNORMAL HIGH (ref 70–99)
Glucose-Capillary: 150 mg/dL — ABNORMAL HIGH (ref 70–99)
Glucose-Capillary: 343 mg/dL — ABNORMAL HIGH (ref 70–99)

## 2024-11-19 LAB — LIPID PANEL
Cholesterol: 94 mg/dL (ref 0–200)
HDL: 41 mg/dL
LDL Cholesterol: 41 mg/dL (ref 0–99)
Total CHOL/HDL Ratio: 2.3 ratio
Triglycerides: 59 mg/dL
VLDL: 12 mg/dL (ref 0–40)

## 2024-11-19 LAB — POCT ACTIVATED CLOTTING TIME
Activated Clotting Time: 291 s
Activated Clotting Time: 353 s
Activated Clotting Time: 199 s
Activated Clotting Time: 178 s
Activated Clotting Time: 179 s

## 2024-11-19 LAB — HEPARIN LEVEL (UNFRACTIONATED): Heparin Unfractionated: 0.38 [IU]/mL (ref 0.30–0.70)

## 2024-11-19 MED ORDER — FREE WATER: 500.0000 mL | Freq: Once | Status: DC

## 2024-11-19 MED ORDER — MIDAZOLAM HCL 2 MG/2ML IJ SOLN
INTRAMUSCULAR | Status: AC
  Filled 2024-11-19: qty 2

## 2024-11-19 MED ORDER — DIPHENHYDRAMINE HCL 50 MG/ML IJ SOLN
INTRAMUSCULAR | Status: AC
  Filled 2024-11-19: qty 1

## 2024-11-19 MED ORDER — FENTANYL CITRATE (PF) 100 MCG/2ML IJ SOLN
INTRAMUSCULAR | Status: DC | PRN
  Administered 2024-11-19: 25 ug via INTRAVENOUS

## 2024-11-19 MED ORDER — NITROGLYCERIN 1 MG/10 ML FOR IR/CATH LAB
INTRA_ARTERIAL | Status: AC
  Filled 2024-11-19: qty 10

## 2024-11-19 MED ORDER — LIDOCAINE HCL (PF) 1 % IJ SOLN
INTRAMUSCULAR | Status: DC | PRN
  Administered 2024-11-19: 12 mL

## 2024-11-19 MED ORDER — ACETAMINOPHEN 325 MG PO TABS: 650.0000 mg | ORAL_TABLET | ORAL | Status: DC | PRN

## 2024-11-19 MED ORDER — LIDOCAINE HCL (PF) 1 % IJ SOLN
INTRAMUSCULAR | Status: AC
  Filled 2024-11-19: qty 30

## 2024-11-19 MED ORDER — IOHEXOL 350 MG/ML SOLN
INTRAVENOUS | Status: DC | PRN
  Administered 2024-11-19: 110 mL via INTRA_ARTERIAL

## 2024-11-19 MED ORDER — HEPARIN (PORCINE) IN NACL 1000-0.9 UT/500ML-% IV SOLN
INTRAVENOUS | Status: DC | PRN
  Administered 2024-11-19 (×2): 500 mL

## 2024-11-19 MED ORDER — DIPHENHYDRAMINE HCL 50 MG/ML IJ SOLN
25.0000 mg | Freq: Once | INTRAMUSCULAR | Status: AC
  Administered 2024-11-19: 25 mg via INTRAVENOUS

## 2024-11-19 MED ORDER — SODIUM CHLORIDE 0.9 % IV SOLN: 250.0000 mL | INTRAVENOUS | Status: DC | PRN

## 2024-11-19 MED ORDER — HEPARIN SODIUM (PORCINE) 1000 UNIT/ML IJ SOLN
INTRAMUSCULAR | Status: DC | PRN
  Administered 2024-11-19: 10000 [IU] via INTRAVENOUS

## 2024-11-19 MED ORDER — HEPARIN SODIUM (PORCINE) 1000 UNIT/ML IJ SOLN
INTRAMUSCULAR | Status: AC
  Filled 2024-11-19: qty 10

## 2024-11-19 MED ORDER — METHYLPREDNISOLONE SODIUM SUCC 125 MG IJ SOLR
125.0000 mg | Freq: Once | INTRAMUSCULAR | Status: AC
  Administered 2024-11-19: 125 mg via INTRAVENOUS

## 2024-11-19 MED ORDER — FENTANYL CITRATE (PF) 100 MCG/2ML IJ SOLN
INTRAMUSCULAR | Status: AC
  Filled 2024-11-19: qty 2

## 2024-11-19 MED ORDER — METHYLPREDNISOLONE SODIUM SUCC 125 MG IJ SOLR
INTRAMUSCULAR | Status: AC
  Filled 2024-11-19: qty 2

## 2024-11-19 MED ORDER — MIDAZOLAM HCL (PF) 2 MG/2ML IJ SOLN
INTRAMUSCULAR | Status: DC | PRN
  Administered 2024-11-19: 2 mg via INTRAVENOUS

## 2024-11-19 NOTE — H&P (View-Only) (Signed)
 "  Progress Note  Patient Name: [name] Date of Encounter: 11/19/2024 [facility] Cardiologist: Jayson Sierras, MD   Interval Summary   No acute events overnight.  Hgb down to 8.3 today (from >10 on admission).  Denies any signs of symptoms of bleeding.    Vital Signs Vitals:   11/18/24 2343 11/19/24 0239 11/19/24 0500 11/19/24 0801  BP: (!) 137/50 (!) 127/54  (!) 141/51  Pulse: (!) 58 (!) 55  60  Resp: 16 17  17   Temp: 98.2 F (36.8 C) 98.3 F (36.8 C)  98.4 F (36.9 C)  TempSrc: Axillary Axillary  Oral  SpO2: 98% 98%  98%  Weight:   120.4 kg   Height:        Intake/Output Summary (Last 24 hours) at 11/19/2024 0826 Last data filed at 11/19/2024 0239 Gross per 24 hour  Intake 235.12 ml  Output 1000 ml  Net -764.88 ml      11/19/2024    5:00 AM 11/17/2024   12:45 AM 11/16/2024    6:25 AM  Last 3 Weights  Weight (lbs) 265 lb 6.9 oz 253 lb 8.5 oz 254 lb  Weight (kg) 120.4 kg 115 kg 115.214 kg      Telemetry/ECG  SB - Personally Reviewed  Physical Exam  GEN: No acute distress.   Neck: No JVD Cardiac: RRR, no murmurs, rubs, or gallops.  Respiratory: Clear to auscultation bilaterally. GI: Soft, nontender, non-distended  MS: No edema Vasc:  Unable to palpate R radial pulse, +1 left radial pulse  Assessment & Plan  Unstable angina/ACS: Reviewed last coronary angiography study which demonstrates patent LIMA to LAD, occluded vein graft to left circumflex system, functionally occluded vein graft to PDA, and high-grade diagonal and high-grade distal OM/PLV lesions with no other targets for intervention. Readmitted with chest pain with plan for PCI of diagonal and PLV. Patient's hemoglobin is dropping.  However it is still above 8 today.  Will check CBC now.  If >8 will proceed to PCI May need anemia evaluation prior or after PCI depending on repeat CBC Consider using a Resolute DES platform as patient would only require 1 month of DAPT Continue aspirin   81 mg Continue Plavix  75 mg  Continue Lopressor  25 twice daily Continue Imdur  60 mg Continue heparin  infusion Hopefully radial approach can be pursued Difficult to palpate R radial pulse >> consider U/S evaluation in lab +1 left radial pulse Given obligate need for A/C, plan on plavix . Will consider short course of triple therapy as informed by anemia, etc. May need to defer ASA Paroxysmal atrial fibrillation On Eliquis  2.5 mg twice daily at home Continue Lopressor  25 mg twice daily T2DM Start Jardiance after PCI Hyperlipidemia Continue atorvastatin  80 mg Continue Zetia  10 mg Hypertension Will optimize regimen after PCI Consider ARB for renal protection. CKD stage IIIa Will consider SGLT2 inhibitor and ARB for renal protection later this admission Decreasing hemoglobin Hemoglobin 8.5 today Recheck CBC now  I have reviewed the risks, indications, and alternatives to cardiac catheterization, possible angioplasty, and stenting with the patient. Risks include but are not limited to bleeding, infection, vascular injury, stroke, myocardial infection, arrhythmia, kidney injury, radiation-related injury in the case of prolonged fluoroscopy use, emergency cardiac surgery, and death. The patient understands the risks of serious complication is 1-2 in 1000 with diagnostic cardiac cath and 1-2% or less with angioplasty/stenting.     For questions or updates, please contact [facility] Please consult [email or web address] for contact info under  Signed, [name], MD   "

## 2024-11-19 NOTE — Progress Notes (Addendum)
 Patient reported 7-8 out of 10 right-sided flank/back pain that has been going on for about an hour.  The patient recently had a cardiac catheterization and received DES x1 to the left PDA and DES x 1 to the first diagonal.  Lower extremities were warm to the touch with pulses present.  Patient's abdomen was nontender to palpation.  No abdominal masses or tension was noted.  Blood pressure stable. Denies any chest pain, shortness of breath.  Will discuss with Dr. Jordan if patient needs any imaging of his abdomen.  Signed,  Morse Clause, PA-C 11/19/2024, 5:58 PM   Addendum: rechecked on the patient at 6:20.  The nurse had just removed the sheath from the patient's groin.  Patient reported that this helped his back pain that he was experiencing.  Patient reported a slight discomfort in his leg but denied any other concerns.  Lower extremities were warm.  Abdomen was nontender to palpation and no masses were palpated.  Signed,  Morse Clause, PA-C 11/19/2024, 6:26 PM   Addendum: The patient was having continued oozing in his groin. Discussed with Dr Kriste and advised the RN to continue holding more pressure. The patient was continuing to have 3/10 back pain. Will order a hemoglobin and hematocrit. May consider abdominal ultrasound or CT if patient has worsening anemia.  Signed,  Morse Clause, PA-C 11/19/2024, 7:29 PM   Addendum: patient reported that his back pain had resolved. No obvious bleeding was present on the patients groin.  Signed,  Morse Clause, PA-C 11/19/2024, 8:12 PM

## 2024-11-19 NOTE — Plan of Care (Signed)
" °  Problem: Coping: Goal: Ability to adjust to condition or change in health will improve Outcome: Progressing   Problem: Fluid Volume: Goal: Ability to maintain a balanced intake and output will improve Outcome: Progressing   Problem: Metabolic: Goal: Ability to maintain appropriate glucose levels will improve Outcome: Progressing   Problem: Nutritional: Goal: Maintenance of adequate nutrition will improve Outcome: Progressing   Problem: Skin Integrity: Goal: Risk for impaired skin integrity will decrease Outcome: Progressing   Problem: Tissue Perfusion: Goal: Adequacy of tissue perfusion will improve Outcome: Progressing   Problem: Education: Goal: Knowledge of General Education information will improve Description: Including pain rating scale, medication(s)/side effects and non-pharmacologic comfort measures Outcome: Progressing   Problem: Clinical Measurements: Goal: Ability to maintain clinical measurements within normal limits will improve Outcome: Progressing Goal: Respiratory complications will improve Outcome: Progressing Goal: Cardiovascular complication will be avoided Outcome: Progressing   Problem: Activity: Goal: Risk for activity intolerance will decrease Outcome: Progressing   "

## 2024-11-19 NOTE — Interval H&P Note (Signed)
 History and Physical Interval Note:  11/19/2024 1:30 PM  [name]  has presented today for surgery, with the diagnosis of CAD.  The various methods of treatment have been discussed with the patient and family. After consideration of risks, benefits and other options for treatment, the patient has consented to  Procedures: CORONARY STENT INTERVENTION (N/A) as a surgical intervention.  The patient's history has been reviewed, patient examined, no change in status, stable for surgery.  I have reviewed the patient's chart and labs.  Questions were answered to the patient's satisfaction.   Cath Lab Visit (complete for each Cath Lab visit)  Clinical Evaluation Leading to the Procedure:   ACS: Yes.    Non-ACS:    Anginal Classification: CCS IV  Anti-ischemic medical therapy: Maximal Therapy (2 or more classes of medications)  Non-Invasive Test Results: No non-invasive testing performed  Prior CABG: Previous CABG        [facility] 11/19/2024 1:30 PM

## 2024-11-19 NOTE — Progress Notes (Addendum)
 PHARMACY - ANTICOAGULATION CONSULT NOTE  Pharmacy Consult for heparin  Indication: chest pain/ACS  Allergies[1]  Patient Measurements: Height: 5' 8 (172.7 cm) Weight: 120.4 kg (265 lb 6.9 oz) IBW/kg (Calculated) : 68.4 HEPARIN  DW (KG): 94.3  Vital Signs: Temp: 98.3 F (36.8 C) (01/19 0239) Temp Source: Axillary (01/19 0239) BP: 127/54 (01/19 0239) Pulse Rate: 55 (01/19 0239)  Labs: Recent Labs    11/17/24 0520 11/17/24 0520 11/17/24 0941 11/17/24 1941 11/18/24 0500 11/19/24 0303  HGB 10.4*  --   --   --  9.0* 8.3*  HCT 32.4*  --   --   --  28.3* 25.7*  PLT 248  --   --   --  191 176  APTT  --   --  135*  --   --   --   HEPARINUNFRC  --    < > 0.77* 0.70 0.62 0.38  CREATININE 2.37*  --  2.05*  --  1.65* 1.48*   < > = values in this interval not displayed.    Estimated Creatinine Clearance: 49.4 mL/min (A) (by C-G formula based on SCr of 1.48 mg/dL (H)).   Medical History: Past Medical History:  Diagnosis Date   Anemia    C. difficile diarrhea    Chest pain 06/07/2018   CKD (chronic kidney disease), stage III (HCC)    Coronary artery disease    Status post CABG in 2007 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 Patient with recent LHC on 1/6 given recurring angina with plans for PCI on 1/22. Pharmacy consulted to dose heparin  for ACS.  Heparin  level therapeutic 0.38, CBC down from admit.  Goal of Therapy:  Heparin  level 0.3-0.7 units/ml Monitor platelets by anticoagulation protocol: Yes   Plan:  Continue heparin  at 950 units/hr Daily heparin  level and CBC  Ozell Jamaica, PharmD, BCPS, [facility] Clinical Pharmacist [phone] Please check AMION for all [facility] Pharmacy numbers 11/19/2024      [1]  Allergies Allergen Reactions   Benazepril Other (See Comments)    Unknown    Lisinopril Other (See Comments)    Unknown    Ivp Dye [Iodinated Contrast Media] Rash

## 2024-11-19 NOTE — Progress Notes (Signed)
 "  Progress Note  Patient Name: [name] Date of Encounter: 11/19/2024 [facility] Cardiologist: Jayson Sierras, MD   Interval Summary   No acute events overnight.  Hgb down to 8.3 today (from >10 on admission).  Denies any signs of symptoms of bleeding.    Vital Signs Vitals:   11/18/24 2343 11/19/24 0239 11/19/24 0500 11/19/24 0801  BP: (!) 137/50 (!) 127/54  (!) 141/51  Pulse: (!) 58 (!) 55  60  Resp: 16 17  17   Temp: 98.2 F (36.8 C) 98.3 F (36.8 C)  98.4 F (36.9 C)  TempSrc: Axillary Axillary  Oral  SpO2: 98% 98%  98%  Weight:   120.4 kg   Height:        Intake/Output Summary (Last 24 hours) at 11/19/2024 0826 Last data filed at 11/19/2024 0239 Gross per 24 hour  Intake 235.12 ml  Output 1000 ml  Net -764.88 ml      11/19/2024    5:00 AM 11/17/2024   12:45 AM 11/16/2024    6:25 AM  Last 3 Weights  Weight (lbs) 265 lb 6.9 oz 253 lb 8.5 oz 254 lb  Weight (kg) 120.4 kg 115 kg 115.214 kg      Telemetry/ECG  SB - Personally Reviewed  Physical Exam  GEN: No acute distress.   Neck: No JVD Cardiac: RRR, no murmurs, rubs, or gallops.  Respiratory: Clear to auscultation bilaterally. GI: Soft, nontender, non-distended  MS: No edema Vasc:  Unable to palpate R radial pulse, +1 left radial pulse  Assessment & Plan  Unstable angina/ACS: Reviewed last coronary angiography study which demonstrates patent LIMA to LAD, occluded vein graft to left circumflex system, functionally occluded vein graft to PDA, and high-grade diagonal and high-grade distal OM/PLV lesions with no other targets for intervention. Readmitted with chest pain with plan for PCI of diagonal and PLV. Patient's hemoglobin is dropping.  However it is still above 8 today.  Will check CBC now.  If >8 will proceed to PCI May need anemia evaluation prior or after PCI depending on repeat CBC Consider using a Resolute DES platform as patient would only require 1 month of DAPT Continue aspirin   81 mg Continue Plavix  75 mg  Continue Lopressor  25 twice daily Continue Imdur  60 mg Continue heparin  infusion Hopefully radial approach can be pursued Difficult to palpate R radial pulse >> consider U/S evaluation in lab +1 left radial pulse Given obligate need for A/C, plan on plavix . Will consider short course of triple therapy as informed by anemia, etc. May need to defer ASA Paroxysmal atrial fibrillation On Eliquis  2.5 mg twice daily at home Continue Lopressor  25 mg twice daily T2DM Start Jardiance after PCI Hyperlipidemia Continue atorvastatin  80 mg Continue Zetia  10 mg Hypertension Will optimize regimen after PCI Consider ARB for renal protection. CKD stage IIIa Will consider SGLT2 inhibitor and ARB for renal protection later this admission Decreasing hemoglobin Hemoglobin 8.5 today Recheck CBC now  I have reviewed the risks, indications, and alternatives to cardiac catheterization, possible angioplasty, and stenting with the patient. Risks include but are not limited to bleeding, infection, vascular injury, stroke, myocardial infection, arrhythmia, kidney injury, radiation-related injury in the case of prolonged fluoroscopy use, emergency cardiac surgery, and death. The patient understands the risks of serious complication is 1-2 in 1000 with diagnostic cardiac cath and 1-2% or less with angioplasty/stenting.     For questions or updates, please contact [facility] Please consult [email or web address] for contact info under  Signed, [name], MD   "

## 2024-11-19 NOTE — Progress Notes (Signed)
 Site area: R fem artery 43fr sheath Site Prior to Removal:  Level 0 Pressure Applied For: Manual:   yes Patient Status During Pull:  stable Post Pull Site:  Level 0 Post Pull Instructions Given:  yes with teach back Post Pull Pulses Present: +1 DP/PT Dressing Applied:  gauze and tegaderm Bedrest begins @ 1830 Comments: Pt verbalized previously experienced back pain improved once sheath removed.

## 2024-11-19 NOTE — Progress Notes (Signed)
 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 Patient was told to stay in the hospital for further hydration and stabilization and anticipated PCI next week. Patient declined, and gone home. He returned back with ongoing chest pain and concern for unstable angina.   Assessment & Plan:   Principal Problem:   Unstable angina (HCC) Active Problems:   Non-ST elevation (NSTEMI) myocardial infarction [facility])  Chest Pain / Unstable Angina: Continue heparin  drip, statin and Plavix . Continue as needed nitroglycerin  and Imdur . Continue telemetry monitoring Patient is scheduled for left heart cath today.   Type 2 diabetes with nephropathy and hyperglycemia: Patient on insulin  therapy as an outpatient. At time of presentation there was concern for early DKA; Excellent response and complete resolution after initiating insulin  drip Patient has been transitioned to sliding scale insulin  and long-acting insulin . Continue modified carbohydrate diet. Maintain adequate hydration.  AKI on CKD stage IIIb > Improving. Most likely associated with the use of contrast during recent cardiac cath Hold nephrotoxic agents /diuretics and provide fluid resuscitation Closely follow renal function trend.  Paroxysmal atrial fibrillation: Eliquis  is on hold for anticipated cardiac cath on 11/19/2024. Patient currently receiving heparin  drip as part of treatment of unstable angina. Continue  metoprolol  for rate control. Continue telemetry monitoring and follow electrolytes.   Chronic diastolic heart failure: Appears to be stable and compensated. Continue to follow daily weights and strict intake and output. Good saturation on room air appreciated. Low-sodium diet discussed with patient.   Essential Hypertension: Continue current antihypertensive agents Follow-up vital sign and adjust medication as needed Continue to hold losartan  for now in the setting of acute kidney injury.   Hyperlipidemia: Continue statin and Zetia . Patient has been encouraged to follow heart healthy diet and to monitor sodium intake.   Class II obesity: -Body mass index is 38.55 kg/m.  -Low-calorie diet, portion control and increase physical activity discussed with patient.    DVT prophylaxis: Heparin  gtt. Code Status: Full code Family Communication: No family at bed side. Disposition Plan:    Status is: Inpatient Remains inpatient appropriate because: Admitted for unstable angina,  plan is for left heart cath today,  Cardiology following. Patient is not medically ready for discharge.    Consultants:  Cardiology  Procedures: Left heart cath today.  Antimicrobials:  Anti-infectives (From admission, onward)    None      Subjective: Patient was seen and examined at bedside.  Overnight events noted. Patient denies any chest pain.  Patient reports he is going to have left heart cath today.  Objective: Vitals:   11/19/24 0500 11/19/24 0801 11/19/24 1116 11/19/24 1323  BP:  (!) 141/51 (!) 105/44   Pulse:  60 (!) 58   Resp:  17 17   Temp:  98.4 F (36.9 C) 98.9 F (37.2 C)   TempSrc:  Oral Oral   SpO2:  98% 100% 100%  Weight: 120.4 kg     Height:        Intake/Output Summary (Last 24 hours) at 11/19/2024 1418 Last data filed at 11/19/2024 0849 Gross per 24  hour  Intake 235.12 ml  Output 1000 ml  Net -764.88 ml   Filed Weights   11/17/24 0045 11/19/24 0500  Weight:  115 kg 120.4 kg    Examination:  General exam: Appears calm and comfortable, not in any acute distress.  Respiratory system: CTA Bilaterally . Respiratory effort normal. RR 16 Cardiovascular system: S1 & S2 heard, RRR. No JVD, murmurs, rubs, gallops or clicks.  Gastrointestinal system: Abdomen is non distended, soft and non tender. Normal bowel sounds heard. Central nervous system: Alert and oriented x 3. No focal neurological deficits. Extremities: No edema, No cyanosis, No clubbing  Skin: No rashes, lesions or ulcers Psychiatry: Judgement and insight appear normal. Mood & affect appropriate.     Data Reviewed: I have personally reviewed following labs and imaging studies  CBC: Recent Labs  Lab 11/17/24 0041 11/17/24 0520 11/18/24 0500 11/19/24 0303 11/19/24 1017  WBC 9.1 13.7* 6.5 6.0 5.6  NEUTROABS  --  10.2*  --   --   --   HGB 9.7* 10.4* 9.0* 8.3* 8.4*  HCT 30.3* 32.4* 28.3* 25.7* 25.8*  MCV 94.4 95.3 94.6 93.1 93.5  PLT 236 248 191 176 189   Basic Metabolic Panel: Recent Labs  Lab 11/17/24 0343 11/17/24 0520 11/17/24 0941 11/18/24 0500 11/19/24 0303  NA 136 139 138 138 137  K 4.4 4.1 4.4 4.4 4.3  CL 102 101 102 104 105  CO2 18* 21* 23 24 24   GLUCOSE 290* 116* 134* 178* 183*  BUN 57* 53* 54* 45* 38*  CREATININE 2.41* 2.37* 2.05* 1.65* 1.48*  CALCIUM  8.4* 8.7* 8.3* 8.3* 8.2*  MG  --  2.6*  --   --   --    GFR: Estimated Creatinine Clearance: 49.4 mL/min (A) (by C-G formula based on SCr of 1.48 mg/dL (H)). Liver Function Tests: Recent Labs  Lab 11/17/24 0520  AST 27  ALT 26  ALKPHOS 66  BILITOT 0.3  PROT 7.5  ALBUMIN 3.9   No results for input(s): LIPASE, AMYLASE in the last 168 hours. No results for input(s): AMMONIA in the last 168 hours. Coagulation Profile: No results for input(s): INR, PROTIME in the last 168 hours. Cardiac Enzymes: No results for input(s): CKTOTAL, CKMB, CKMBINDEX, TROPONINI in the last 168 hours. BNP  (last 3 results) Recent Labs    09/15/24 0931 09/17/24 1043 10/04/24 1507  PROBNP 334.0* 338.0* 133.0   HbA1C: No results for input(s): HGBA1C in the last 72 hours. CBG: Recent Labs  Lab 11/18/24 2131 11/18/24 2137 11/19/24 0604 11/19/24 0802 11/19/24 1123  GLUCAP 236* 241* 162* 139* 114*   Lipid Profile: Recent Labs    11/19/24 0303  CHOL 94  HDL 41  LDLCALC 41  TRIG 59  CHOLHDL 2.3   Thyroid  Function Tests: No results for input(s): TSH, T4TOTAL, FREET4, T3FREE, THYROIDAB in the last 72 hours. Anemia Panel: No results for input(s): VITAMINB12, FOLATE, FERRITIN, TIBC, IRON, RETICCTPCT in the last 72 hours. Sepsis Labs: No results for input(s): PROCALCITON, LATICACIDVEN in the last 168 hours.  No results found for this or any previous visit (from the past 240 hours).   Radiology Studies: No results found.  Scheduled Meds:  [MAR Hold] amLODipine   10 mg Oral Daily   [MAR Hold] aspirin  EC  81 mg Oral Daily   [MAR Hold] atorvastatin   80 mg Oral Daily   [MAR Hold] clopidogrel   75 mg Oral Daily   [MAR Hold] ezetimibe   10 mg Oral Daily   [MAR Hold]  free water   500 mL Oral Once   [MAR Hold] gabapentin   300 mg Oral BID   [MAR Hold] insulin  aspart  0-5 Units Subcutaneous QHS   [MAR Hold] insulin  aspart  0-9 Units Subcutaneous TID WC   [MAR Hold] insulin  glargine-yfgn  10 Units Subcutaneous BID   [MAR Hold] isosorbide  mononitrate  60 mg Oral Daily   [MAR Hold] metoprolol  tartrate  25 mg Oral BID   [MAR Hold] mycophenolate   500 mg Oral BID   nitroGLYCERIN        [MAR Hold] tamsulosin   0.4 mg Oral QPM   Continuous Infusions:  heparin  Stopped (11/19/24 1315)     LOS: 2 days    Time spent: 50 mins    Darcel Dawley, MD [facility]   If 7PM-7AM, please contact night-coverage  "

## 2024-11-19 NOTE — Progress Notes (Signed)
 Gauze and tegaderm saturated. Applied at [employer]. PA aware. This RN held pressure for . PA at bedside to assess. Ok'd patient to continue to 6E. PA to update Cardiologist.   On arrival to 6E gauze dressing partially saturated. RN initiated manual pressure for ~65min and applied pressure dressing. On coming  6E RN observed and made aware of dressing change. Updated PA.

## 2024-11-20 ENCOUNTER — Encounter (HOSPITAL_COMMUNITY): Payer: Self-pay | Admitting: Cardiology

## 2024-11-20 ENCOUNTER — Other Ambulatory Visit (HOSPITAL_COMMUNITY): Payer: Self-pay

## 2024-11-20 DIAGNOSIS — I200 Unstable angina: Secondary | ICD-10-CM | POA: Diagnosis not present

## 2024-11-20 LAB — GLUCOSE, CAPILLARY: Glucose-Capillary: 293 mg/dL — ABNORMAL HIGH (ref 70–99)

## 2024-11-20 LAB — BASIC METABOLIC PANEL WITH GFR
Anion gap: 13 (ref 5–15)
BUN: 34 mg/dL — ABNORMAL HIGH (ref 8–23)
CO2: 15 mmol/L — ABNORMAL LOW (ref 22–32)
Calcium: 8.5 mg/dL — ABNORMAL LOW (ref 8.9–10.3)
Chloride: 105 mmol/L (ref 98–111)
Creatinine, Ser: 1.72 mg/dL — ABNORMAL HIGH (ref 0.61–1.24)
GFR, Estimated: 39 mL/min — ABNORMAL LOW
Glucose, Bld: 342 mg/dL — ABNORMAL HIGH (ref 70–99)
Potassium: 5 mmol/L (ref 3.5–5.1)
Sodium: 134 mmol/L — ABNORMAL LOW (ref 135–145)

## 2024-11-20 LAB — CBC
HCT: 31.6 % — ABNORMAL LOW (ref 39.0–52.0)
Hemoglobin: 9.9 g/dL — ABNORMAL LOW (ref 13.0–17.0)
MCH: 31.1 pg (ref 26.0–34.0)
MCHC: 31.3 g/dL (ref 30.0–36.0)
MCV: 99.4 fL (ref 80.0–100.0)
Platelets: 217 K/uL (ref 150–400)
RBC: 3.18 MIL/uL — ABNORMAL LOW (ref 4.22–5.81)
RDW: 13.7 % (ref 11.5–15.5)
WBC: 7.8 K/uL (ref 4.0–10.5)
nRBC: 0 % (ref 0.0–0.2)

## 2024-11-20 LAB — POCT ACTIVATED CLOTTING TIME: Activated Clotting Time: 168 s

## 2024-11-20 MED ORDER — TORSEMIDE 20 MG PO TABS: 40.0000 mg | ORAL_TABLET | Freq: Every day | ORAL | Status: DC

## 2024-11-20 MED ORDER — HYDRALAZINE HCL 20 MG/ML IJ SOLN: 10.0000 mg | INTRAMUSCULAR | Status: DC | PRN

## 2024-11-20 MED ORDER — ONDANSETRON HCL 4 MG/2ML IJ SOLN: 4.0000 mg | Freq: Four times a day (QID) | INTRAMUSCULAR | Status: DC | PRN

## 2024-11-20 MED ORDER — APIXABAN 2.5 MG PO TABS
2.5000 mg | ORAL_TABLET | Freq: Two times a day (BID) | ORAL | Status: DC
  Administered 2024-11-20: 2.5 mg via ORAL
  Filled 2024-11-20: qty 1

## 2024-11-20 MED ORDER — ENOXAPARIN SODIUM 40 MG/0.4ML IJ SOSY: 40.0000 mg | PREFILLED_SYRINGE | INTRAMUSCULAR | Status: DC

## 2024-11-20 MED ORDER — METHOCARBAMOL 500 MG PO TABS
500.0000 mg | ORAL_TABLET | Freq: Three times a day (TID) | ORAL | 0 refills | Status: AC | PRN
  Filled 2024-11-20: qty 45, 15d supply, fill #0

## 2024-11-20 MED ORDER — LOSARTAN POTASSIUM 25 MG PO TABS
25.0000 mg | ORAL_TABLET | Freq: Every day | ORAL | Status: DC
  Administered 2024-11-20: 25 mg via ORAL
  Filled 2024-11-20: qty 1

## 2024-11-20 MED ORDER — SODIUM CHLORIDE 0.9% FLUSH: 3.0000 mL | INTRAVENOUS | Status: DC | PRN

## 2024-11-20 MED ORDER — ASPIRIN 81 MG PO TBEC
81.0000 mg | DELAYED_RELEASE_TABLET | Freq: Every day | ORAL | 12 refills | Status: DC
  Filled 2024-11-20: qty 30, 30d supply, fill #0

## 2024-11-20 MED ORDER — TORSEMIDE 20 MG PO TABS
40.0000 mg | ORAL_TABLET | Freq: Every day | ORAL | 0 refills | Status: AC
  Filled 2024-11-20: qty 60, 30d supply, fill #0

## 2024-11-20 MED ORDER — SODIUM CHLORIDE 0.9% FLUSH
3.0000 mL | Freq: Two times a day (BID) | INTRAVENOUS | Status: DC
  Administered 2024-11-20: 3 mL via INTRAVENOUS

## 2024-11-20 NOTE — Progress Notes (Addendum)
 CARDIAC REHAB PHASE I   PRE:  Rate/Rhythm: 62 SR  SR   BP: sitting 149/53    SpO2: 100 RA  MODE:  Ambulation: 150 ft   POST:  Rate/Rhythm: 80 SR    BP: sitting 171/60     SpO2:    Pt c/o being weak and stiff due to bedrest. Able to move himself to EOB and stand. Ambulated with RW, slow with standby assist with gait belt. Pt mostly to baseline. He has an aide daily who helps with mobility. Denied CP or groin c/o.  Discussed with pt MI, stents, Plavix  importance, walking x2-3 daily, diet, NTG and CRPII. Pt receptive. Will refer to [facility] CRPII.  [id number]  Aliene Aris BS, ACSM-CEP 11/20/2024 8:43 AM

## 2024-11-20 NOTE — Progress Notes (Addendum)
 "  Rounding Note   Patient Name: [name] Date of Encounter: 11/20/2024  [facility] Cardiologist: Jayson Sierras, MD   Subjective He is feeling well this morning. No chest pain or shortness of breath. However, he feels weak after being in bed for a few days.  Scheduled Meds:  amLODipine   10 mg Oral Daily   aspirin  EC  81 mg Oral Daily   atorvastatin   80 mg Oral Daily   clopidogrel   75 mg Oral Daily   ezetimibe   10 mg Oral Daily   free water   500 mL Oral Once   free water   500 mL Oral Once   gabapentin   300 mg Oral BID   insulin  aspart  0-5 Units Subcutaneous QHS   insulin  aspart  0-9 Units Subcutaneous TID WC   insulin  glargine-yfgn  10 Units Subcutaneous BID   isosorbide  mononitrate  60 mg Oral Daily   metoprolol  tartrate  25 mg Oral BID   mycophenolate   500 mg Oral BID   tamsulosin   0.4 mg Oral QPM   Continuous Infusions:  sodium chloride      PRN Meds: sodium chloride , acetaminophen , albuterol , alum & mag hydroxide-simeth, dextrose , methocarbamol , nitroGLYCERIN    Vital Signs  Vitals:   11/19/24 1825 11/19/24 1830 11/19/24 2354 11/20/24 0529  BP: (!) 148/59 (!) 137/57 (!) 145/57   Pulse: 60 61    Resp: 16 16 17 18   Temp:   98.3 F (36.8 C) 98.5 F (36.9 C)  TempSrc:   Oral Oral  SpO2: 98% 98% 99%   Weight:    119.5 kg  Height:        Intake/Output Summary (Last 24 hours) at 11/20/2024 0708 Last data filed at 11/19/2024 1900 Gross per 24 hour  Intake 0 ml  Output 650 ml  Net -650 ml      11/20/2024    5:29 AM 11/19/2024    5:00 AM 11/17/2024   12:45 AM  Last 3 Weights  Weight (lbs) 263 lb 7.2 oz 265 lb 6.9 oz 253 lb 8.5 oz  Weight (kg) 119.5 kg 120.4 kg 115 kg      Telemetry Sinus rhythm with rates in the 50s to 60s. - Personally Reviewed  ECG  Sinus bradycardia, rate 57 bpm, with non-specific T wave changes. - Personally Reviewed  Physical Exam  GEN: Obese African-American male resting comfortably in no acute distress.   Neck: No  JVD. Cardiac: RRR. No murmurs, rubs, or gallops. Respiratory: Clear to auscultation bilaterally. No wheezes, rhonchi, or rales. MS: No lower extremity edema. Right femoral cath site soft with no signs of hematoma. Neuro:  No focal deficits. Psych: Normal affect. Responds appropriately.  Labs High Sensitivity Troponin:  No results for input(s): TROPONINIHS in the last 720 hours.  Recent Labs  Lab 11/06/24 0417 11/17/24 0041 11/17/24 0343 11/17/24 0520 11/17/24 0701  TRNPT 40* 76* 101* 127* 146*       Chemistry Recent Labs  Lab 11/17/24 0520 11/17/24 0941 11/18/24 0500 11/19/24 0303 11/20/24 0451  NA 139   < > 138 137 134*  K 4.1   < > 4.4 4.3 5.0  CL 101   < > 104 105 105  CO2 21*   < > 24 24 15*  GLUCOSE 116*   < > 178* 183* 342*  BUN 53*   < > 45* 38* 34*  CREATININE 2.37*   < > 1.65* 1.48* 1.72*  CALCIUM  8.7*   < > 8.3* 8.2* 8.5*  MG  2.6*  --   --   --   --   PROT 7.5  --   --   --   --   ALBUMIN 3.9  --   --   --   --   AST 27  --   --   --   --   ALT 26  --   --   --   --   ALKPHOS 66  --   --   --   --   BILITOT 0.3  --   --   --   --   GFRNONAA 27*   < > 41* 47* 39*  ANIONGAP 18*   < > 10 8 13    < > = values in this interval not displayed.    Lipids  Recent Labs  Lab 11/19/24 0303  CHOL 94  TRIG 59  HDL 41  LDLCALC 41  CHOLHDL 2.3    Hematology Recent Labs  Lab 11/19/24 0303 11/19/24 1017 11/20/24 0451  WBC 6.0 5.6 7.8  RBC 2.76* 2.76* 3.18*  HGB 8.3* 8.4* 9.9*  HCT 25.7* 25.8* 31.6*  MCV 93.1 93.5 99.4  MCH 30.1 30.4 31.1  MCHC 32.3 32.6 31.3  RDW 13.4 13.6 13.7  PLT 176 189 217   Thyroid  No results for input(s): TSH, FREET4 in the last 168 hours.  BNPNo results for input(s): BNP, PROBNP in the last 168 hours.  DDimer No results for input(s): DDIMER in the last 168 hours.   Radiology  CARDIAC CATHETERIZATION Result Date: 11/19/2024   Dist Cx lesion is 90% stenosed.   A drug-eluting stent was successfully placed using a  STENT SYNERGY XD 2.50X20.   Post intervention, there is a 0% residual stenosis.   1st Diag lesion is 95% stenosed.   A drug-eluting stent was successfully placed using a STENT ONYX FRONTIER 2.25X12.   Post intervention, there is a 0% residual stenosis.   Recommend to resume Apixaban , at currently prescribed dose and frequency on 11/20/2024.   Recommend concurrent antiplatelet therapy of Aspirin  81 mg for 1 month and Clopidogrel  75mg  daily for 12 months . Successful PCI of the left PDA with DES x1 Successful PCI of the first diagonal with DES x 1 Plan; DAPT with ASA for one month, Plavix  for 12 months. May resume Eliquis  tomorrow if no bleeding. Anticipate DC tomorrow if stable.    Cardiac Studies  Echocardiogram 09/18/2024: Impressions:  1. Left ventricular ejection fraction, by estimation, is 55 to 60%. The  left ventricle has normal function. Left ventricular endocardial border  not optimally defined to evaluate regional wall motion. There is mild left  ventricular hypertrophy. Left  ventricular diastolic parameters are consistent with Grade I diastolic  dysfunction (impaired relaxation).   2. Right ventricular systolic function was not well visualized. The right  ventricular size is not well visualized. Tricuspid regurgitation signal is  inadequate for assessing PA pressure.   3. The mitral valve was not well visualized. No evidence of mitral valve  regurgitation. No evidence of mitral stenosis.   4. The aortic valve was not well visualized. There is mild calcification  of the aortic valve. There is mild thickening of the aortic valve. Aortic  valve regurgitation is not visualized. No aortic stenosis is present.   5. Technically difficult study, limited visualization.  _______________  Left Cardiac Catheterization 11/16/2024: 1.  Severe native multivessel coronary artery disease with total occlusion of the proximal RCA, total occlusion of the mid LAD, severe stenosis  of the proximal LAD  extending into her first diagonal branch, and severe stenosis of a posterolateral branch of the circumflex. 2.  Status post multivessel CABG with continued patency of the LIMA to LAD, interval occlusion or subtotal occlusion of all the other vein grafts. 3.  Normal LVEDP   Patient has very complex, calcified diffusely diseased coronary vessels.  There are a few potential targets for PCI, but they are heavily calcified leading into branch vessels.  Would favor a trial of medical therapy considering his poor functional capacity and comorbid conditions.  If he fails medical therapy, could attempt PCI of the LAD into the diagonal and of the left posterolateral branch.  I do not think the saphenous vein graft to PDA branch is salvageable.  It would require stenting the entire graft and I think the likelihood of that remaining patent is exceedingly low.  Diagnostic Dominance: Right  _______________  Coronary Stent Intervention 11/19/2024:   Dist Cx lesion is 90% stenosed.   A drug-eluting stent was successfully placed using a STENT SYNERGY XD 2.50X20.   Post intervention, there is a 0% residual stenosis.   1st Diag lesion is 95% stenosed.   A drug-eluting stent was successfully placed using a STENT ONYX FRONTIER 2.25X12.   Post intervention, there is a 0% residual stenosis.   Recommend to resume Apixaban , at currently prescribed dose and frequency on 11/20/2024.   Recommend concurrent antiplatelet therapy of Aspirin  81 mg for 1 month and Clopidogrel  75mg  daily for 12 months .   Successful PCI of the left PDA with DES x1 Successful PCI of the first diagonal with DES x 1  Diagnostic Dominance: Right  Intervention       Patient Profile   84 y.o. male with a history of CAD s/p CABG x4 in 2007, chronic HFpEF, paroxysmal atrial fibrillation on Eliquis , recurrent CVA, hypertension, hyperlipidemia, type 2 diabetes mellitus, CKD stage IIIb, chronic anemia, and obstructive sleep apnea.  Recent  outpatient cardiac catheterization on 11/16/2024 showed severe multivessel native CAD with patent LIMA-LAD but occluded SVG-OM1 and SVG-OM3 as well as subtotal occlusion of SVG to PDA. He was felt to have poor targets for PCI and a trial of medical therapy was recommended. However, he was then admitted on 11/17/2024 with chest pain and elevated troponin concerning for NSTEMI. There was also concern for early DKA on admission and he was started on Insulin  drip.  Assessment & Plan   NSTEMI CAD s/p CABG Patient with history of CAD with remote CABG times 01/2006.  Recent outpatient LHC on 11/16/2024 showed severe multivessel native CAD with patent LIMA to LAD but occluded for subtotal occlusion of all vein grafts.  Medical therapy was recommended.  However, he presented back to the ED the next day for chest pain and elevated troponin concerning for NSTEMI.  High sensitive troponin 76 >> 101 >> 127 >> 146. He underwent repeat LHC on 11/20/2023 with successful PCI with DES x1 to 1st Diag and DES x1 to left PDA. - No recurrent chest pain. - Continue antianginals: Amlodipine  10mg  daily, Imdur  60mg  daily, and Lopressor  25mg  twice daily. - Plan is for triple therapy with aspirin  81 mg daily, Plavix  75 mg daily, and Eliquis  2.5 mg twice daily for 1 month at which time Aspirin  can be discontinued.  Plavix  should be continued for at least 12 months. - Continue Lipitor 80mg  daily. - Cardiac Rehab to see.   Chronic HFpEF Last Echo in 09/2024 showed LVEF of 55-60% with mild LVH  and grade 1 diastolic dysfunction as well as normal RV function. - Euvolemic on exam.  - Will discuss when to resume home Torsemide  with MD (today vs tomorrow).  Paroxysmal Atrial Fibrillation Maintaining sinus rhythm.  - Continue Lopressor  25mg  twice daily.  - Will restart Eliquis  2.5mg  twice daily. Reduced dose due to age and renal function.  Hypertension He has had a couple of high BP reading (one with systolic BP in the 190s) but  mostly systolic BP is in the 130s-140s. - Continue Amlodipine  10mg  daily.  - Continue Imdur  60mg  daily.  - Continue Lopressor  25mg  twice daily. - Will restart home Losartan  25mg  daily.  Hyperlipidemia Lipid panel this admission: Total Cholesterol 94, Triglycerides 59, HDL 41, LDL 41.  - Continue Lipitor 80mg  daily.  CKD Stage IIIb Creatinine 2.41 on admission. Baseline around 1.7 to 2.0.  - Creatinine up from 1.48 yesterday to 1.72 today after cath yesterday but at baseline.  Otherwise, per primary team: - Type 2 diabetes mellitus with hyperglycemia - Chronic anemia: stable   For questions or updates, please contact [facility] Please consult [email or web address] for contact info under     Signed, Callie E Goodrich, PA-C  11/20/2024, 7:08 AM     ATTENDING ATTESTATION:  After conducting a review of all available clinical information with the care team, interviewing the patient, and performing a physical exam, I agree with the findings and plan described in this note with adjustments as indicated below which were discussed and enacted by staff above.   GEN: No acute distress, AO x 3 HEENT:  MMM, no JVD, no scleral icterus Cardiac: RRR, no murmurs, rubs, or gallops.  Respiratory: Clear to auscultation bilaterally. GI: Soft, nontender, non-distended  MS: No edema; No deformity. Neuro:  Nonfocal  Vasc:  RFA site intact  S/p PCI of distal Lcx and D1 yesterday via RFA approach.  No acute events overnight.  Patient feels well.  Plan on triple therapy x 1 month followed by plavix  and low dose Eliquis  x 1 year.  Restart home losartan  and torsemide  but will decrease from 60mg  to 40mg .  Cr mildly elevated but still around baseline.  Will arrange for BMP in one week.  Cardiac rehab to see.  Will sign off, will arrange outpatient follow up.    Susannah Carbin, MD Pager [phone]   "

## 2024-11-20 NOTE — Discharge Summary (Signed)
 Physician Discharge Summary  [name] FMW:[id number] DOB: 08-03-1941 DOA: 11/17/2024  PCP: Darren Monetta RAMAN, MD  Admit date: 11/17/2024  Discharge date: 11/20/2024  Admitted From: Home  Disposition:  Home with Home Health.  Recommendations for Outpatient Follow-up:  Follow up with PCP in 1-2 weeks. Please obtain BMP/CBC in one week. Advised to follow-up with Cardiology as scheduled. Advised to continue triple anticoagulant for 1 month (aspirin , Plavix , Eliquis  )and then discontinue aspirin . Continue Plavix  and low-dose Eliquis  afterwards for 12 months.  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 Patient was told to stay in the hospital for further hydration and stabilization and anticipated PCI next week. Patient declined, and gone home. He returned back with ongoing chest pain and concern for unstable angina.  Cardiology has evaluated the patient and has decided to do left heart cath, status post PCI of distal LX and D1 via RFA approach.  No overnight events noted.  Cardiologist recommended continue triple therapy aspirin , Plavix  and low-dose Eliquis  for 1 month followed by Plavix  and low-dose Eliquis  for 1 year.  Torsemide  and losartan  was resumed.  Patient feels better and wants to be discharged.  Cardiology signed off.  Discharge Diagnoses:  Principal Problem:   Unstable angina (HCC) Active Problems:   Non-ST  elevation (NSTEMI) myocardial infarction [facility])  Chest Pain / Unstable Angina: Continued heparin  drip, statin and Plavix . Continue as needed nitroglycerin  and Imdur . Continue telemetry monitoring Underwent LHC, status post PCI distal LX and D1 via RFA approach. Cardiology recommended triple therapy for 1 month followed by Plavix  and Eliquis  for 1 year. Cardiology signed off.   Type 2 diabetes with nephropathy and hyperglycemia: Patient on insulin  therapy as an outpatient. At time of presentation there was concern for early DKA; Excellent response and complete resolution after initiating insulin  drip Patient has been transitioned to sliding scale insulin  and long-acting insulin . Continue modified carbohydrate diet. Maintain adequate hydration.   AKI on CKD stage IIIb > Improving. Most likely associated with the use of contrast during recent cardiac cath Hold nephrotoxic agents /diuretics and provide fluid resuscitation Closely follow renal functions trend.   Paroxysmal atrial fibrillation: Eliquis  is on hold for anticipated cardiac cath on 11/19/2024. Patient currently receiving heparin  drip as part of treatment of unstable angina. Continue metoprolol  for rate control. Continue telemetry monitoring and follow electrolytes. Heparin  discontinued and  transitioned to Eliquis .   Chronic diastolic heart failure: Appears to be stable and compensated. Continue to follow daily weights and strict intake and output. Good saturation on room air appreciated. Low-sodium diet discussed with patient.   Essential Hypertension: Continue current antihypertensive agents Follow-up vital sign and adjust medication as needed Continue to hold losartan  for now in the setting of acute kidney injury.   Hyperlipidemia: Continue statin and Zetia . Patient has been encouraged to follow heart healthy diet and to monitor sodium intake.   Class II obesity: -Body mass index is 38.55 kg/m.  -Low-calorie  diet, portion control and increase physical activity discussed with patient.  Discharge Instructions  Discharge Instructions     Amb Referral to Cardiac  Rehabilitation   Complete by: As directed    Diagnosis:  Coronary Stents NSTEMI PTCA     After initial evaluation and assessments completed: Virtual Based Care may be provided alone or in conjunction with Phase 2 Cardiac Rehab based on patient barriers.: Yes   Intensive Cardiac Rehabilitation (ICR) [facility] location only OR Traditional Cardiac Rehabilitation (TCR) *If criteria for ICR are not met will enroll in TCR [facility] only): Yes   Call MD for:  difficulty breathing, headache or visual disturbances   Complete by: As directed    Call MD for:  persistant dizziness or light-headedness   Complete by: As directed    Call MD for:  persistant nausea and vomiting   Complete by: As directed    Diet Carb Modified   Complete by: As directed    Discharge instructions   Complete by: As directed    Follow-up with primary care physician in 1 week. Advised to follow-up with cardiology as scheduled. Advised to continue triple anticoagulant for 1 month (aspirin , Plavix , Eliquis  )and then discontinue aspirin . Continue Plavix  and low-dose Eliquis  afterwards for 12 months.   Increase activity slowly   Complete by: As directed       Allergies as of 11/20/2024       Reactions   Benazepril Other (See Comments)   Unknown    Lisinopril Other (See Comments)   Unknown    Ivp Dye [iodinated Contrast Media] Rash        Medication List     STOP taking these medications    predniSONE  50 MG tablet Commonly known as: DELTASONE        TAKE these medications    acetaminophen  650 MG CR tablet Commonly known as: TYLENOL  Take 1,300 mg by mouth every 8 (eight) hours as needed for pain.   albuterol  108 (90 Base) MCG/ACT inhaler Commonly known as: VENTOLIN  HFA Inhale 1-2 puffs into the lungs every 6 (six) hours as needed for shortness of breath.    amLODipine  10 MG tablet Commonly known as: NORVASC  Take 10 mg by mouth daily.   aspirin  EC 81 MG tablet Take 1 tablet (81 mg total) by mouth daily. Swallow whole. Start taking on: November 21, 2024   atorvastatin  80 MG tablet Commonly known as: LIPITOR Take 40 mg by mouth daily.   clopidogrel  75 MG tablet Commonly known as: Plavix  Take 1 tablet (75 mg total) by mouth daily.   colchicine  0.6 MG tablet Take 0.6-1.2 mg by mouth See admin instructions. Take 2 tablets (1.2mg ) by mouth at the onset of gout attack. If pain persists, take 1 tablet (0.6mg ) 1 hour later and take 1 tablet twice daily until pain resolves. Not to exceed 5 days per attack.   diclofenac Sodium 1 % Gel Commonly known as: VOLTAREN Apply 2 g topically every 6 (six) hours as needed (pain).   diphenhydrAMINE  50 MG tablet Commonly known as: BENADRYL  Take 1 tablet (50 mg total) prior to leaving for hospital on 11/16/24   Eliquis  5 MG Tabs tablet Generic drug: apixaban  Take 2.5 mg by mouth 2 (two) times daily.   ezetimibe  10 MG tablet Commonly known as: ZETIA  Take 1 tablet (10 mg total) by mouth daily.   gabapentin  300 MG capsule Commonly known as: NEURONTIN  Take 300 mg by mouth 3 (three) times daily.   insulin  aspart protamine- aspart (70-30) 100 UNIT/ML injection Commonly known as: NOVOLOG  MIX 70/30 Inject 0.4 mLs (40 Units total) into the skin 2 (two) times daily with a meal.  What changed:  how much to take when to take this additional instructions   isosorbide  mononitrate 30 MG 24 hr tablet Commonly known as: IMDUR  Take 2 tablets (60 mg total) by mouth daily.   lidocaine  5 % Commonly known as: LIDODERM  Place 1 patch onto the skin daily.   losartan  25 MG tablet Commonly known as: COZAAR  Take 25 mg by mouth daily.   Maalox Max 400-400-40 MG/5ML suspension Generic drug: alum & mag hydroxide-simeth Take 15 mLs by mouth as needed for indigestion.   methocarbamol  500 MG tablet Commonly known  as: ROBAXIN  Take 1 tablet (500 mg total) by mouth every 8 (eight) hours as needed for up to 15 days for muscle spasms.   metoprolol  tartrate 50 MG tablet Commonly known as: LOPRESSOR  Take 0.5 tablets (25 mg total) by mouth 2 (two) times daily.   mycophenolate  500 MG tablet Commonly known as: CELLCEPT  Take 1,000 mg by mouth 2 (two) times daily.   nitroGLYCERIN  0.4 MG SL tablet Commonly known as: NITROSTAT  Place 1 tablet (0.4 mg total) under the tongue every 5 (five) minutes as needed for chest pain.   OZEMPIC (0.25 OR 0.5 MG/DOSE) [place] Inject 0.25 mg into the skin every Friday.   tamsulosin  0.4 MG Caps capsule Commonly known as: FLOMAX  Take 0.4 mg by mouth every evening.   torsemide  20 MG tablet Commonly known as: DEMADEX  Take 2 tablets (40 mg total) by mouth daily. What changed:  how much to take additional instructions   vitamin B-12 500 MCG tablet Commonly known as: CYANOCOBALAMIN  Take 1,000 mcg by mouth daily.   Vitamin D  (Ergocalciferol ) 1.25 MG (50000 UNIT) Caps capsule Commonly known as: DRISDOL Take 50,000 Units by mouth once a week.        Follow-up Information     Miriam Norris, NP Follow up.   Specialty: Cardiology Why: Hospital follow-up with Cardiology scheduled for 11/30/2024 at 8:00am. If this date/ time does not work for you, please call our office to reschedule. Contact information: [address] [place] [address] [phone]         Darren Monetta RAMAN, MD Follow up in 1 week(s).   Specialty: Internal Medicine Contact information: [address] [place] [address] [phone]         [facility] Follow up.   Specialty: Home Health Services Why: Physical and Occupational Therapy- Registered Nurse-office to call with visit times. Contact information: [address] [place] [address] [phone]                 Allergies[1]  Consultations: Cardiology   Procedures/Studies: CARDIAC CATHETERIZATION Result Date: 11/19/2024   Dist Cx lesion is 90% stenosed.   A drug-eluting stent was successfully placed using a STENT SYNERGY XD 2.50X20.   Post intervention, there is a 0% residual stenosis.   1st Diag lesion is 95% stenosed.   A drug-eluting stent was successfully placed using a STENT ONYX FRONTIER 2.25X12.   Post intervention, there is a 0% residual stenosis.   Recommend to resume Apixaban , at currently prescribed dose and frequency on 11/20/2024.   Recommend concurrent antiplatelet therapy of Aspirin  81 mg for 1 month and Clopidogrel  75mg  daily for 12 months . Successful PCI of the left PDA with DES x1 Successful PCI of the first diagonal with DES x 1 Plan; DAPT with ASA for one month, Plavix  for 12 months. May resume Eliquis  tomorrow if no bleeding. Anticipate DC tomorrow if stable.  DG Chest Portable 1 View Result Date: 11/17/2024 EXAM: 1 VIEW(S) XRAY OF THE CHEST 11/17/2024 12:59:57 AM COMPARISON: 11/05/2024 CLINICAL HISTORY: CP CP CP CP CP FINDINGS: LUNGS AND PLEURA: No focal pulmonary opacity. No pleural effusion. No pneumothorax. HEART AND MEDIASTINUM: Surgical changes in mediastinum. Intact sternotomy wires. No acute abnormality of the cardiac and mediastinal silhouettes. BONES AND SOFT TISSUES: Surgical clips in right neck. No acute osseous abnormality. IMPRESSION: 1. No acute process. Electronically signed by: Oneil Devonshire MD 11/17/2024 01:03 AM EST RP Workstation: [id number]   CARDIAC CATHETERIZATION Result Date: 11/16/2024 1.  Severe native multivessel coronary artery disease with total occlusion of the proximal RCA, total occlusion of the mid LAD, severe stenosis of the proximal LAD extending into her first diagonal branch, and severe stenosis of a posterolateral branch of the circumflex. 2.  Status post multivessel CABG with continued patency of the LIMA to LAD, interval occlusion or subtotal  occlusion of all the other vein grafts. 3.  Normal LVEDP Patient has very complex, calcified diffusely diseased coronary vessels.  There are a few potential targets for PCI, but they are heavily calcified leading into branch vessels.  Would favor a trial of medical therapy considering his poor functional capacity and comorbid conditions.  If he fails medical therapy, could attempt PCI of the LAD into the diagonal and of the left posterolateral branch.  I do not think the saphenous vein graft to PDA branch is salvageable.  It would require stenting the entire graft and I think the likelihood of that remaining patent is exceedingly low.   DG Elbow Complete Right Result Date: 11/06/2024 EXAM: 3 VIEW(S) XRAY OF THE RIGHT ELBOW COMPARISON: None available. CLINICAL HISTORY: pain pain FINDINGS: BONES AND JOINTS: No acute fracture. No malalignment. SOFT TISSUES: Vascular calcifications. IMPRESSION: 1. No acute findings. Electronically signed by: Franky Crease MD 11/06/2024 03:09 AM EST RP Workstation: [id number]   DG Chest Port 1 View Result Date: 11/05/2024 EXAM: 1 VIEW(S) XRAY OF THE CHEST 11/05/2024 04:20:24 AM COMPARISON: 10/29/2024 CLINICAL HISTORY: chest pain FINDINGS: LUNGS AND PLEURA: Elevated left hemidiaphragm. No focal pulmonary opacity. No pleural effusion. No pneumothorax. HEART AND MEDIASTINUM: No acute abnormality of the cardiac and mediastinal silhouettes. BONES AND SOFT TISSUES: Surgical clips in right neck. Sternotomy wires noted. No acute osseous abnormality. IMPRESSION: 1. No acute cardiopulmonary abnormality. 2. Elevated left hemidiaphragm with postsurgical changes including median sternotomy wires and right neck surgical clips. Electronically signed by: Evalene Coho MD 11/05/2024 04:30 AM EST RP Workstation: [id number]   NM Myocar Multi W/Spect Marisela Motion / EF Result Date: 10/30/2024   No ST deviation was noted. Pharmacological protocol is used. Baseline EKG showed normal sinus rhythm, Q  waves in inferior leads, non-specific T wave inversions in lateral leads and poor R-wave progression.   LV perfusion is abnormal. There is evidence of ischemia. There is no evidence of infarction. There is a small reversible perfusion defect in the apex with normal wall motion, likely apical thinning artifact versus very minimal ischemia in the apex.   Left ventricular function is normal. Nuclear stress EF: 63%.   Findings are consistent with ischemia (versus artifact) and no infarction. The study is low risk.   DG Chest Portable 1 View Result Date: 10/29/2024 EXAM: 1 VIEW(S) XRAY OF THE CHEST 10/29/2024 07:41:00 AM COMPARISON: 10/04/2024 CLINICAL HISTORY: Chest Pain FINDINGS: LUNGS AND PLEURA: No focal pulmonary opacity. No pleural effusion. No pneumothorax. HEART AND MEDIASTINUM: Vascular clips over mediastinum. No acute abnormality of the cardiac and mediastinal silhouettes.  BONES AND SOFT TISSUES: Surgical clips at thoracic inlet. Surgical clips in right neck. Intact sternotomy wires. No acute osseous abnormality. IMPRESSION: 1. No acute findings. Electronically signed by: Waddell Calk MD 10/29/2024 07:49 AM EST RP Workstation: [id number]    Subjective: Patient was seen and examined at bedside.  Overnight events noted. Patient reports feeling much improved,  chest pain has resolved , He wants to be discharged home.  Discharge Exam: Vitals:   11/20/24 0529 11/20/24 0741  BP:  (!) 149/53  Pulse:  (!) 58  Resp: 18 20  Temp: 98.5 F (36.9 C) 98.6 F (37 C)  SpO2:  100%   Vitals:   11/19/24 1830 11/19/24 2354 11/20/24 0529 11/20/24 0741  BP: (!) 137/57 (!) 145/57  (!) 149/53  Pulse: 61   (!) 58  Resp: 16 17 18 20   Temp:  98.3 F (36.8 C) 98.5 F (36.9 C) 98.6 F (37 C)  TempSrc:  Oral Oral Oral  SpO2: 98% 99%  100%  Weight:   119.5 kg   Height:        General: Pt is alert, awake, not in acute distress Cardiovascular: RRR, S1/S2 +, no rubs, no gallops Respiratory: CTA  bilaterally, no wheezing, no rhonchi Abdominal: Soft, NT, ND, bowel sounds + Extremities: no edema, no cyanosis    The results of significant diagnostics from this hospitalization (including imaging, microbiology, ancillary and laboratory) are listed below for reference.     Microbiology: No results found for this or any previous visit (from the past 240 hours).   Labs: BNP (last 3 results) Recent Labs    12/20/23 0255  BNP 45.0   Basic Metabolic Panel: Recent Labs  Lab 11/17/24 0520 11/17/24 0941 11/18/24 0500 11/19/24 0303 11/20/24 0451  NA 139 138 138 137 134*  K 4.1 4.4 4.4 4.3 5.0  CL 101 102 104 105 105  CO2 21* 23 24 24  15*  GLUCOSE 116* 134* 178* 183* 342*  BUN 53* 54* 45* 38* 34*  CREATININE 2.37* 2.05* 1.65* 1.48* 1.72*  CALCIUM  8.7* 8.3* 8.3* 8.2* 8.5*  MG 2.6*  --   --   --   --    Liver Function Tests: Recent Labs  Lab 11/17/24 0520  AST 27  ALT 26  ALKPHOS 66  BILITOT 0.3  PROT 7.5  ALBUMIN 3.9   No results for input(s): LIPASE, AMYLASE in the last 168 hours. No results for input(s): AMMONIA in the last 168 hours. CBC: Recent Labs  Lab 11/17/24 0520 11/18/24 0500 11/19/24 0303 11/19/24 1017 11/20/24 0451  WBC 13.7* 6.5 6.0 5.6 7.8  NEUTROABS 10.2*  --   --   --   --   HGB 10.4* 9.0* 8.3* 8.4* 9.9*  HCT 32.4* 28.3* 25.7* 25.8* 31.6*  MCV 95.3 94.6 93.1 93.5 99.4  PLT 248 191 176 189 217   Cardiac Enzymes: No results for input(s): CKTOTAL, CKMB, CKMBINDEX, TROPONINI in the last 168 hours. BNP: Invalid input(s): POCBNP CBG: Recent Labs  Lab 11/19/24 0802 11/19/24 1123 11/19/24 1529 11/19/24 2152 11/20/24 0740  GLUCAP 139* 114* 150* 343* 293*   D-Dimer No results for input(s): DDIMER in the last 72 hours. Hgb A1c No results for input(s): HGBA1C in the last 72 hours. Lipid Profile Recent Labs    11/19/24 0303  CHOL 94  HDL 41  LDLCALC 41  TRIG 59  CHOLHDL 2.3   Thyroid  function studies No  results for input(s): TSH, T4TOTAL, T3FREE, THYROIDAB in the last 72 hours.  Invalid input(s): FREET3 Anemia work up No results for input(s): VITAMINB12, FOLATE, FERRITIN, TIBC, IRON, RETICCTPCT in the last 72 hours. Urinalysis    Component Value Date/Time   COLORURINE STRAW (A) 09/18/2024 1700   APPEARANCEUR CLEAR 09/18/2024 1700   LABSPEC 1.011 09/18/2024 1700   PHURINE 5.0 09/18/2024 1700   GLUCOSEU NEGATIVE 09/18/2024 1700   HGBUR SMALL (A) 09/18/2024 1700   BILIRUBINUR NEGATIVE 09/18/2024 1700   KETONESUR NEGATIVE 09/18/2024 1700   PROTEINUR NEGATIVE 09/18/2024 1700   UROBILINOGEN 0.2 03/28/2014 0145   NITRITE NEGATIVE 09/18/2024 1700   LEUKOCYTESUR NEGATIVE 09/18/2024 1700   Sepsis Labs Recent Labs  Lab 11/18/24 0500 11/19/24 0303 11/19/24 1017 11/20/24 0451  WBC 6.5 6.0 5.6 7.8   Microbiology No results found for this or any previous visit (from the past 240 hours).   Time coordinating discharge: Over 30 minutes  SIGNED:   Darcel Dawley, MD  [facility] 11/20/2024, 1:18 PM Pager   If 7PM-7AM, please contact night-coverage     [1]  Allergies Allergen Reactions   Benazepril Other (See Comments)    Unknown    Lisinopril Other (See Comments)    Unknown    Ivp Dye [Iodinated Contrast Media] Rash

## 2024-11-20 NOTE — TOC Transition Note (Signed)
 Transition of Care [facility]) - Discharge Note   Patient Details  Name: [name] MRN: [id number] Date of Birth: [birth year]  [facility] CM/SW Contact:  Sudie Erminio Deems, RN Phone Number: 11/20/2024, 11:48 AM   Clinical Narrative Patient will transition home today with [facility]- PT/OT RN. Patient has DME rolling walker and electric wheelchair. Patient currently has personal care services via [facility] 7 days a week 8:15-1:15 pm. Patient is a member of the [employer] [place] and the office is aware of hospitalization. [facility] is aware that the patient will dc home today. Spouse is at the bedside and she will transport home via private vehicle.   Final next level of care: Home w Home Health Services Barriers to Discharge: No Barriers Identified   Patient Goals and CMS Choice Patient states their goals for this hospitalization and ongoing recovery are:: Plan to return home with [facility].  Discharge Plan and Services Additional resources added to the After Visit Summary for       Post Acute Care Choice: Home Health ([facility])           HH Arranged: RN, Disease Management HH Agency: [facility] Date [facility] Agency Contacted: 11/17/24 Time HH Agency Contacted: [phone] Representative spoke with at [facility] Agency: [place] via Text msg  Social Drivers of Health (SDOH) Interventions SDOH Screenings   Food Insecurity: No Food Insecurity (11/18/2024)  Housing: Low Risk (11/18/2024)  Transportation Needs: No Transportation Needs (11/18/2024)  Utilities: Not At Risk (11/18/2024)  Depression (PHQ2-9): High Risk (04/13/2022)  Social Connections: Moderately Integrated (11/18/2024)  Tobacco Use: Medium Risk (11/17/2024)     Readmission Risk Interventions    11/17/2024    9:16 AM 09/20/2024    9:36 AM  Readmission Risk Prevention Plan  Transportation Screening Complete Complete  PCP or Specialist Appt within 5-7 Days  Complete   Home Care Screening  Complete  Medication Review (RN CM)  Complete  Medication Review (RN Care Manager) Complete   HRI or Home Care Consult Complete   SW Recovery Care/Counseling Consult Complete

## 2024-11-20 NOTE — Plan of Care (Signed)
 Problem: Education: Goal: Ability to describe self-care measures that may prevent or decrease complications (Diabetes Survival Skills Education) will improve Outcome: Progressing Goal: Individualized Educational Video(s) Outcome: Progressing   Problem: Coping: Goal: Ability to adjust to condition or change in health will improve Outcome: Progressing   Problem: Fluid Volume: Goal: Ability to maintain a balanced intake and output will improve Outcome: Progressing   Problem: Health Behavior/Discharge Planning: Goal: Ability to identify and utilize available resources and services will improve Outcome: Progressing Goal: Ability to manage health-related needs will improve Outcome: Progressing   Problem: Metabolic: Goal: Ability to maintain appropriate glucose levels will improve Outcome: Progressing   Problem: Nutritional: Goal: Maintenance of adequate nutrition will improve Outcome: Progressing Goal: Progress toward achieving an optimal weight will improve Outcome: Progressing   Problem: Skin Integrity: Goal: Risk for impaired skin integrity will decrease Outcome: Progressing   Problem: Tissue Perfusion: Goal: Adequacy of tissue perfusion will improve Outcome: Progressing   Problem: Education: Goal: Ability to describe self-care measures that may prevent or decrease complications (Diabetes Survival Skills Education) will improve Outcome: Progressing Goal: Individualized Educational Video(s) Outcome: Progressing   Problem: Cardiac: Goal: Ability to maintain an adequate cardiac output will improve Outcome: Progressing   Problem: Health Behavior/Discharge Planning: Goal: Ability to identify and utilize available resources and services will improve Outcome: Progressing Goal: Ability to manage health-related needs will improve Outcome: Progressing   Problem: Fluid Volume: Goal: Ability to achieve a balanced intake and output will improve Outcome: Progressing    Problem: Metabolic: Goal: Ability to maintain appropriate glucose levels will improve Outcome: Progressing   Problem: Nutritional: Goal: Maintenance of adequate nutrition will improve Outcome: Progressing Goal: Maintenance of adequate weight for body size and type will improve Outcome: Progressing   Problem: Respiratory: Goal: Will regain and/or maintain adequate ventilation Outcome: Progressing   Problem: Urinary Elimination: Goal: Ability to achieve and maintain adequate renal perfusion and functioning will improve Outcome: Progressing   Problem: Education: Goal: Knowledge of General Education information will improve Description: Including pain rating scale, medication(s)/side effects and non-pharmacologic comfort measures Outcome: Progressing   Problem: Health Behavior/Discharge Planning: Goal: Ability to manage health-related needs will improve Outcome: Progressing   Problem: Clinical Measurements: Goal: Ability to maintain clinical measurements within normal limits will improve Outcome: Progressing Goal: Will remain free from infection Outcome: Progressing Goal: Diagnostic test results will improve Outcome: Progressing Goal: Respiratory complications will improve Outcome: Progressing Goal: Cardiovascular complication will be avoided Outcome: Progressing   Problem: Activity: Goal: Risk for activity intolerance will decrease Outcome: Progressing   Problem: Nutrition: Goal: Adequate nutrition will be maintained Outcome: Progressing   Problem: Coping: Goal: Level of anxiety will decrease Outcome: Progressing   Problem: Elimination: Goal: Will not experience complications related to bowel motility Outcome: Progressing Goal: Will not experience complications related to urinary retention Outcome: Progressing   Problem: Pain Managment: Goal: General experience of comfort will improve and/or be controlled Outcome: Progressing   Problem: Safety: Goal:  Ability to remain free from injury will improve Outcome: Progressing   Problem: Skin Integrity: Goal: Risk for impaired skin integrity will decrease Outcome: Progressing   Problem: Education: Goal: Understanding of CV disease, CV risk reduction, and recovery process will improve Outcome: Progressing Goal: Individualized Educational Video(s) Outcome: Progressing   Problem: Activity: Goal: Ability to return to baseline activity level will improve Outcome: Progressing   Problem: Cardiovascular: Goal: Ability to achieve and maintain adequate cardiovascular perfusion will improve Outcome: Progressing Goal: Vascular access site(s) Level 0-1 will be maintained  Outcome: Progressing   Problem: Health Behavior/Discharge Planning: Goal: Ability to safely manage health-related needs after discharge will improve Outcome: Progressing

## 2024-11-20 NOTE — Discharge Instructions (Addendum)
 Medication Changes: - START Aspirin  81mg  daily in addition to the Plavix  75mg  daily and Eliquis  2.5mg  twice daily that you were already taking at home. The Aspirin  and Plavix  are very important  in helping keep the new stents in your heart open. The Eliquis  is important is helping prevent a stroke given your atrial fibrillation. You can stop Aspirin  after 1 month (last day 12/20/2024).  - DECREASE Torsemide  to 40mg  daily.  Post NSTEMI: NO HEAVY LIFTING X 2 WEEKS. NO SEXUAL ACTIVITY X 2 WEEKS. NO DRIVING X 1 WEEK. NO SOAKING BATHS, HOT TUBS, POOLS, ETC., X 7 DAYS.  Groin Site Care: Refer to this sheet in the next few weeks. These instructions provide you with information on caring for yourself after your procedure. Your caregiver may also give you more specific instructions. Your treatment has been planned according to current medical practices, but problems sometimes occur. Call your caregiver if you have any problems or questions after your procedure. HOME CARE INSTRUCTIONS You may shower 24 hours after the procedure. Remove the bandage (dressing) and gently wash the site with plain soap and water . Gently pat the site dry.  Do not apply powder or lotion to the site.  Do not sit in a bathtub, swimming pool, or whirlpool for 5 to 7 days.  No bending, squatting, or lifting anything over 10 pounds (4.5 kg) as directed by your caregiver.  Inspect the site at least twice daily.  Do not drive home if you are discharged the same day of the procedure. Have someone else drive you.  What to expect: Any bruising will usually fade within 1 to 2 weeks.  Blood that collects in the tissue (hematoma) may be painful to the touch. It should usually decrease in size and tenderness within 1 to 2 weeks.  SEEK IMMEDIATE MEDICAL CARE IF: You have unusual pain at the groin site or down the affected leg.  You have redness, warmth, swelling, or pain at the groin site.  You have drainage (other than a small amount of  blood on the dressing).  You have chills.  You have a fever or persistent symptoms for more than 72 hours.  You have a fever and your symptoms suddenly get worse.  Your leg becomes pale, cool, tingly, or numb.  You have heavy bleeding from the site. Hold pressure on the site.

## 2024-11-21 MED FILL — Nitroglycerin IV Soln 100 MCG/ML in D5W: INTRA_ARTERIAL | Qty: 10 | Status: AC

## 2024-11-22 ENCOUNTER — Ambulatory Visit (HOSPITAL_COMMUNITY): Admission: RE | Admit: 2024-11-22 | Source: Home / Self Care | Admitting: Cardiovascular Disease

## 2024-11-22 ENCOUNTER — Encounter (HOSPITAL_COMMUNITY): Admission: RE | Payer: Self-pay | Source: Home / Self Care

## 2024-11-22 SURGERY — CORONARY STENT INTERVENTION
Anesthesia: LOCAL

## 2024-11-23 ENCOUNTER — Telehealth (HOSPITAL_COMMUNITY): Payer: Self-pay

## 2024-11-23 NOTE — Telephone Encounter (Signed)
 Called patient regarding cardiac rehab referral. Spoke with his wife. They do not have transportation. They want to come through the [facility] and are going to contact his community care contact person and request a referral from the [facility] be sent to [facility] and also ask about transportation options.

## 2024-11-30 ENCOUNTER — Encounter: Payer: Self-pay | Admitting: Nurse Practitioner

## 2024-11-30 ENCOUNTER — Ambulatory Visit: Attending: Nurse Practitioner | Admitting: Nurse Practitioner

## 2024-11-30 VITALS — BP 134/52 | HR 61 | Ht 68.0 in | Wt 255.0 lb

## 2024-11-30 DIAGNOSIS — I4892 Unspecified atrial flutter: Secondary | ICD-10-CM | POA: Diagnosis present

## 2024-11-30 DIAGNOSIS — N1832 Chronic kidney disease, stage 3b: Secondary | ICD-10-CM | POA: Insufficient documentation

## 2024-11-30 DIAGNOSIS — I5032 Chronic diastolic (congestive) heart failure: Secondary | ICD-10-CM | POA: Diagnosis present

## 2024-11-30 DIAGNOSIS — E782 Mixed hyperlipidemia: Secondary | ICD-10-CM | POA: Insufficient documentation

## 2024-11-30 DIAGNOSIS — I2510 Atherosclerotic heart disease of native coronary artery without angina pectoris: Secondary | ICD-10-CM | POA: Insufficient documentation

## 2024-11-30 DIAGNOSIS — I480 Paroxysmal atrial fibrillation: Secondary | ICD-10-CM | POA: Diagnosis present

## 2024-11-30 DIAGNOSIS — R600 Localized edema: Secondary | ICD-10-CM | POA: Insufficient documentation

## 2024-11-30 MED ORDER — ASPIRIN 81 MG PO TBEC: 81.0000 mg | DELAYED_RELEASE_TABLET | Freq: Every day | ORAL | Status: AC

## 2024-11-30 NOTE — Patient Instructions (Signed)
 Medication Instructions:  Your physician has recommended you make the following change in your medication:  Stop taking Aspirin  on 12/21/2024 Continue taking all other medications as prescribed   Labwork: None  Testing/Procedures: None  Follow-Up: Your physician recommends that you schedule a follow-up appointment in: 2-3 months  Any Other Special Instructions Will Be Listed Below (If Applicable). Thank you for choosing [facility]!     If you need a refill on your cardiac medications before your next appointment, please call your pharmacy.

## 2024-11-30 NOTE — Progress Notes (Unsigned)
 " Cardiology Office Note   Date:  11/30/2024 ID:  [name], [name] January 31, 1941, MRN [id number] PCP: Darren Monetta RAMAN, MD  [facility] Providers Cardiologist:  Jayson Sierras, MD     History of Present Illness [name] is a 84 y.o. male with a PMH of CAD, s/p CABG, HFpEF, PAF, A-flutter, CKD stage 3b, mixed HLD, who presents today for s/p PCI.   Last seen by Dr. Sierras on 11/14/2024. He reported accelerating angina and was set up for LHC on 11/16/2024, and report states that pt had very complex, calcified CAD, medical therapy favoried d/t patient's poor functional capacity and comorbid conditions, ? PCI of LAD - then underwent coronary stent intervention on 11/19/2024 and underwent successful PCI of left PDA and successful PCI of first diagonal with DES. Recommended to do DAPT x 1 month and Plavix  x 12 months.   He is here for follow-up with his wife today. He states he is doing better since leaving the hospital. Does report stable leg edema, has chronic increased swelling to LLE, wife says he had previous vein taken out of this leg. Denies any chest pain, shortness of breath, palpitations, syncope, presyncope, dizziness, orthopnea, PND, significant weight changes, acute bleeding, or claudication. Tells me that x-ray technician in hospital told him he had old shrapnel in right arm, causing his arm pain. Denies any recurrent arm pain since leaving the hospital.   SH: served in [employer], [place] veteran.   ROS: Negative. See HPI.   Studies Reviewed  EKG: EKG is not ordered today.   Coronary stent intervention 11/2024:    Dist Cx lesion is 90% stenosed.   A drug-eluting stent was successfully placed using a STENT SYNERGY XD 2.50X20.   Post intervention, there is a 0% residual stenosis.   1st Diag lesion is 95% stenosed.   A drug-eluting stent was successfully placed using a STENT ONYX FRONTIER 2.25X12.   Post intervention, there is a 0% residual stenosis.   Recommend to resume  Apixaban , at currently prescribed dose and frequency on 11/20/2024.   Recommend concurrent antiplatelet therapy of Aspirin  81 mg for 1 month and Clopidogrel  75mg  daily for 12 months .   Successful PCI of the left PDA with DES x1 Successful PCI of the first diagonal with DES x 1   Plan; DAPT with ASA for one month, Plavix  for 12 months. May resume Eliquis  tomorrow if no bleeding. Anticipate DC tomorrow if stable.    LHC 11/2024:  1.  Severe native multivessel coronary artery disease with total occlusion of the proximal RCA, total occlusion of the mid LAD, severe stenosis of the proximal LAD extending into her first diagonal branch, and severe stenosis of a posterolateral branch of the circumflex. 2.  Status post multivessel CABG with continued patency of the LIMA to LAD, interval occlusion or subtotal occlusion of all the other vein grafts. 3.  Normal LVEDP   Patient has very complex, calcified diffusely diseased coronary vessels.  There are a few potential targets for PCI, but they are heavily calcified leading into branch vessels.  Would favor a trial of medical therapy considering his poor functional capacity and comorbid conditions.  If he fails medical therapy, could attempt PCI of the LAD into the diagonal and of the left posterolateral branch.  I do not think the saphenous vein graft to PDA branch is salvageable.  It would require stenting the entire graft and I think the likelihood of that remaining patent is exceedingly low.  Lexiscan  10/2024:    No ST deviation was noted. Pharmacological protocol is used. Baseline EKG showed normal sinus rhythm, Q waves in inferior leads, non-specific T wave inversions in lateral leads and poor R-wave progression.   LV perfusion is abnormal. There is evidence of ischemia. There is no evidence of infarction. There is a small reversible perfusion defect in the apex with normal wall motion, likely apical thinning artifact versus very minimal ischemia in the  apex.   Left ventricular function is normal. Nuclear stress EF: 63%.   Findings are consistent with ischemia (versus artifact) and no infarction. The study is low risk.   Lower extremity DVT 10/2024:  IMPRESSION: 1. No evidence for acute deep venous thrombosis in left lower extremity. 2. Incomplete compressibility in left popliteal vein with a large echogenic focus that most likely represents calcium . 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  Echo 09/2024:  1. Left ventricular ejection fraction, by estimation, is 55 to 60%. The  left ventricle has normal function. Left ventricular endocardial border  not optimally defined to evaluate regional wall motion. There is mild left  ventricular hypertrophy. Left  ventricular diastolic parameters are consistent with Grade I diastolic  dysfunction (impaired relaxation).   2. Right ventricular systolic function was not well visualized. The right  ventricular size is not well visualized. Tricuspid regurgitation signal is  inadequate for assessing PA pressure.   3. The mitral valve was not well visualized. No evidence of mitral valve  regurgitation. No evidence of mitral stenosis.   4. The aortic valve was not well visualized. There is mild calcification  of the aortic valve. There is mild thickening of the aortic valve. Aortic  valve regurgitation is not visualized. No aortic stenosis is present.   5. Technically difficult study, limited visualization.   Physical Exam VS:  BP (!) 134/52   Pulse 61   Ht 5' 8 (1.727 m)   Wt 255 lb (115.7 kg)   SpO2 99%   BMI 38.77 kg/m        Wt Readings from Last 3 Encounters:  11/30/24 255 lb (115.7 kg)  11/20/24 263 lb 7.2 oz (119.5 kg)  11/16/24 254 lb (115.2 kg)    GEN: Well nourished, well developed in no acute distress NECK: No JVD; No carotid bruits CARDIAC: S1/S2, RRR, no murmurs, rubs, gallops RESPIRATORY:  Clear to auscultation without rales, wheezing or rhonchi  ABDOMEN:  Soft, non-tender, non-distended EXTREMITIES:  Nonpitting edema (LLE > RLE); No deformity   ASSESSMENT AND PLAN  CAD, s/p CABG Underwent recent left heart cath and underwent successful PCI of left PDA and successful PCI of first diagonal with DES. Recommended to do DAPT x 1 month and Plavix  x 12 months.  Instructed him that he will take aspirin  until 1 month from his cath.  Denies any bleeding issues.  No other medication changes at this time. Heart healthy diet and regular cardiovascular exercise encouraged.   HFpEF, leg edema Stage C, NYHA class I-II symptoms. EF 55-60%.  Weight is down quite a bit from July 2025.  Weight in July was 270 pounds, weight today is 255 pounds.  Overall appears euvolemic on exam.  Does have some chronic leg swelling, but family member says this is better than previously.  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  Continue current medication regimen. Low sodium diet, fluid restriction <2L, and daily weights encouraged. Educated to contact our office for weight gain of 2 lbs overnight or 5 lbs in one week.  PAF/A-flutter Denies any tachycardia or palpitations. HR is well controlled today. Continue Lopressor  and continue Eliquis .  Instructed him to stop aspirin  at the 1 mark month post cardiac cath. No other medication changes at this time. Heart healthy diet and regular cardiovascular exercise encouraged.   Mixed HLD Most recent LDL was 41 from in January 2026.  Continue atorvastatin  and Zetia . Heart healthy diet and regular cardiovascular exercise encouraged.   CKD stage 3b  Most recent labs show overall stable kidney function.  Avoid nephrotoxic agents.  No other medication changes at this time.  Continue follow-up with nephrology  and PCP.   Dispo: Follow-up with [facility] in 2 to 3 months or sooner if any changes.  Signed, Almarie Crate, NP   "

## 2024-12-04 ENCOUNTER — Telehealth: Payer: Self-pay | Admitting: Cardiology

## 2024-12-04 NOTE — Telephone Encounter (Signed)
 Spoke to [facility] and relayed information from provider. Mallorie verbalized understanding and had no further questions or concerns at this time.

## 2024-12-04 NOTE — Telephone Encounter (Signed)
 Mallorie, PT with [facility] would like to discuss any upper extremity restrictions for patient to begin resistance training. Please advise.

## 2024-12-25 ENCOUNTER — Ambulatory Visit: Admitting: Cardiology

## 2025-01-17 ENCOUNTER — Ambulatory Visit: Admitting: Rheumatology

## 2025-02-05 ENCOUNTER — Ambulatory Visit: Admitting: Cardiology

## 2025-02-28 ENCOUNTER — Ambulatory Visit: Admitting: Rheumatology

## 2025-03-08 ENCOUNTER — Ambulatory Visit: Admitting: "Endocrinology
